# Patient Record
Sex: Female | Born: 1935 | Race: White | Hispanic: No | Marital: Married | Smoking: Former smoker
Health system: Southern US, Community
[De-identification: ages and names within clinical notes are randomized; demographics above are authoritative.]

## PROBLEM LIST (undated history)

## (undated) DIAGNOSIS — J45909 Unspecified asthma, uncomplicated: Secondary | ICD-10-CM

## (undated) DIAGNOSIS — M4306 Spondylolysis, lumbar region: Secondary | ICD-10-CM

## (undated) DIAGNOSIS — E039 Hypothyroidism, unspecified: Secondary | ICD-10-CM

## (undated) DIAGNOSIS — I5032 Chronic diastolic (congestive) heart failure: Secondary | ICD-10-CM

## (undated) DIAGNOSIS — K277 Chronic peptic ulcer, site unspecified, without hemorrhage or perforation: Secondary | ICD-10-CM

## (undated) DIAGNOSIS — K219 Gastro-esophageal reflux disease without esophagitis: Secondary | ICD-10-CM

## (undated) DIAGNOSIS — T753XXA Motion sickness, initial encounter: Secondary | ICD-10-CM

## (undated) DIAGNOSIS — H209 Unspecified iridocyclitis: Secondary | ICD-10-CM

## (undated) DIAGNOSIS — I480 Paroxysmal atrial fibrillation: Secondary | ICD-10-CM

## (undated) DIAGNOSIS — I7300 Raynaud's syndrome without gangrene: Secondary | ICD-10-CM

## (undated) DIAGNOSIS — Z972 Presence of dental prosthetic device (complete) (partial): Secondary | ICD-10-CM

## (undated) DIAGNOSIS — Z87442 Personal history of urinary calculi: Secondary | ICD-10-CM

## (undated) DIAGNOSIS — R519 Headache, unspecified: Secondary | ICD-10-CM

## (undated) DIAGNOSIS — N159 Renal tubulo-interstitial disease, unspecified: Secondary | ICD-10-CM

## (undated) DIAGNOSIS — I341 Nonrheumatic mitral (valve) prolapse: Secondary | ICD-10-CM

## (undated) DIAGNOSIS — E119 Type 2 diabetes mellitus without complications: Secondary | ICD-10-CM

## (undated) DIAGNOSIS — M1990 Unspecified osteoarthritis, unspecified site: Secondary | ICD-10-CM

## (undated) DIAGNOSIS — R062 Wheezing: Secondary | ICD-10-CM

## (undated) DIAGNOSIS — I00 Rheumatic fever without heart involvement: Secondary | ICD-10-CM

## (undated) DIAGNOSIS — K589 Irritable bowel syndrome without diarrhea: Secondary | ICD-10-CM

## (undated) DIAGNOSIS — F419 Anxiety disorder, unspecified: Secondary | ICD-10-CM

## (undated) DIAGNOSIS — J449 Chronic obstructive pulmonary disease, unspecified: Secondary | ICD-10-CM

## (undated) DIAGNOSIS — Z973 Presence of spectacles and contact lenses: Secondary | ICD-10-CM

## (undated) DIAGNOSIS — G4700 Insomnia, unspecified: Secondary | ICD-10-CM

## (undated) DIAGNOSIS — R002 Palpitations: Secondary | ICD-10-CM

## (undated) DIAGNOSIS — S72002A Fracture of unspecified part of neck of left femur, initial encounter for closed fracture: Secondary | ICD-10-CM

## (undated) DIAGNOSIS — M459 Ankylosing spondylitis of unspecified sites in spine: Secondary | ICD-10-CM

## (undated) DIAGNOSIS — M329 Systemic lupus erythematosus, unspecified: Secondary | ICD-10-CM

## (undated) DIAGNOSIS — R51 Headache: Secondary | ICD-10-CM

## (undated) DIAGNOSIS — G629 Polyneuropathy, unspecified: Secondary | ICD-10-CM

## (undated) HISTORY — DX: Nonrheumatic mitral (valve) prolapse: I34.1

## (undated) HISTORY — DX: Systemic lupus erythematosus, unspecified: M32.9

## (undated) HISTORY — DX: Palpitations: R00.2

## (undated) HISTORY — DX: Chronic diastolic (congestive) heart failure: I50.32

## (undated) HISTORY — DX: Renal tubulo-interstitial disease, unspecified: N15.9

## (undated) HISTORY — DX: Chronic obstructive pulmonary disease, unspecified: J44.9

## (undated) HISTORY — DX: Insomnia, unspecified: G47.00

## (undated) HISTORY — DX: Type 2 diabetes mellitus without complications: E11.9

## (undated) HISTORY — DX: Unspecified osteoarthritis, unspecified site: M19.90

## (undated) HISTORY — DX: Unspecified iridocyclitis: H20.9

## (undated) HISTORY — PX: COLONOSCOPY: SHX174

## (undated) HISTORY — PX: ESOPHAGOGASTRODUODENOSCOPY: SHX1529

## (undated) HISTORY — DX: Fracture of unspecified part of neck of left femur, initial encounter for closed fracture: S72.002A

## (undated) HISTORY — PX: BREAST BIOPSY: SHX20

## (undated) HISTORY — DX: Irritable bowel syndrome, unspecified: K58.9

## (undated) HISTORY — DX: Spondylolysis, lumbar region: M43.06

## (undated) HISTORY — DX: Paroxysmal atrial fibrillation: I48.0

## (undated) HISTORY — DX: Chronic peptic ulcer, site unspecified, without hemorrhage or perforation: K27.7

## (undated) HISTORY — DX: Unspecified asthma, uncomplicated: J45.909

## (undated) HISTORY — PX: BIOPSY THYROID: PRO38

## (undated) HISTORY — DX: Personal history of urinary calculi: Z87.442

## (undated) HISTORY — DX: Hypothyroidism, unspecified: E03.9

## (undated) HISTORY — DX: Raynaud's syndrome without gangrene: I73.00

## (undated) HISTORY — DX: Rheumatic fever without heart involvement: I00

## (undated) HISTORY — PX: JOINT REPLACEMENT: SHX530

## (undated) HISTORY — DX: Ankylosing spondylitis of unspecified sites in spine: M45.9

## (undated) HISTORY — DX: Anxiety disorder, unspecified: F41.9

---

## 1988-06-12 HISTORY — PX: TOTAL ABDOMINAL HYSTERECTOMY W/ BILATERAL SALPINGOOPHORECTOMY: SHX83

## 2011-06-15 DIAGNOSIS — G5620 Lesion of ulnar nerve, unspecified upper limb: Secondary | ICD-10-CM | POA: Diagnosis not present

## 2011-06-15 DIAGNOSIS — R252 Cramp and spasm: Secondary | ICD-10-CM | POA: Diagnosis not present

## 2011-06-15 DIAGNOSIS — G5600 Carpal tunnel syndrome, unspecified upper limb: Secondary | ICD-10-CM | POA: Diagnosis not present

## 2011-06-15 DIAGNOSIS — G609 Hereditary and idiopathic neuropathy, unspecified: Secondary | ICD-10-CM | POA: Diagnosis not present

## 2011-06-23 DIAGNOSIS — E039 Hypothyroidism, unspecified: Secondary | ICD-10-CM | POA: Diagnosis not present

## 2011-07-20 DIAGNOSIS — G589 Mononeuropathy, unspecified: Secondary | ICD-10-CM | POA: Diagnosis not present

## 2011-07-20 DIAGNOSIS — E039 Hypothyroidism, unspecified: Secondary | ICD-10-CM | POA: Diagnosis not present

## 2011-07-20 DIAGNOSIS — IMO0001 Reserved for inherently not codable concepts without codable children: Secondary | ICD-10-CM | POA: Diagnosis not present

## 2011-07-26 DIAGNOSIS — M549 Dorsalgia, unspecified: Secondary | ICD-10-CM | POA: Diagnosis not present

## 2011-07-26 DIAGNOSIS — E039 Hypothyroidism, unspecified: Secondary | ICD-10-CM | POA: Diagnosis not present

## 2011-07-26 DIAGNOSIS — M25569 Pain in unspecified knee: Secondary | ICD-10-CM | POA: Diagnosis not present

## 2011-09-12 DIAGNOSIS — E119 Type 2 diabetes mellitus without complications: Secondary | ICD-10-CM | POA: Diagnosis not present

## 2011-09-12 DIAGNOSIS — H2510 Age-related nuclear cataract, unspecified eye: Secondary | ICD-10-CM | POA: Diagnosis not present

## 2011-09-14 DIAGNOSIS — Z1231 Encounter for screening mammogram for malignant neoplasm of breast: Secondary | ICD-10-CM | POA: Diagnosis not present

## 2011-09-18 DIAGNOSIS — E039 Hypothyroidism, unspecified: Secondary | ICD-10-CM | POA: Diagnosis not present

## 2011-09-18 DIAGNOSIS — E041 Nontoxic single thyroid nodule: Secondary | ICD-10-CM | POA: Diagnosis not present

## 2011-09-18 DIAGNOSIS — E1149 Type 2 diabetes mellitus with other diabetic neurological complication: Secondary | ICD-10-CM | POA: Diagnosis not present

## 2011-09-20 DIAGNOSIS — L259 Unspecified contact dermatitis, unspecified cause: Secondary | ICD-10-CM | POA: Diagnosis not present

## 2011-09-20 DIAGNOSIS — E039 Hypothyroidism, unspecified: Secondary | ICD-10-CM | POA: Diagnosis not present

## 2011-09-20 DIAGNOSIS — IMO0001 Reserved for inherently not codable concepts without codable children: Secondary | ICD-10-CM | POA: Diagnosis not present

## 2011-09-20 DIAGNOSIS — N951 Menopausal and female climacteric states: Secondary | ICD-10-CM | POA: Diagnosis not present

## 2011-09-21 DIAGNOSIS — E049 Nontoxic goiter, unspecified: Secondary | ICD-10-CM | POA: Diagnosis not present

## 2011-09-27 DIAGNOSIS — G5620 Lesion of ulnar nerve, unspecified upper limb: Secondary | ICD-10-CM | POA: Diagnosis not present

## 2011-09-27 DIAGNOSIS — M542 Cervicalgia: Secondary | ICD-10-CM | POA: Diagnosis not present

## 2011-09-27 DIAGNOSIS — M545 Low back pain: Secondary | ICD-10-CM | POA: Diagnosis not present

## 2011-09-27 DIAGNOSIS — E1149 Type 2 diabetes mellitus with other diabetic neurological complication: Secondary | ICD-10-CM | POA: Diagnosis not present

## 2011-09-27 DIAGNOSIS — G5600 Carpal tunnel syndrome, unspecified upper limb: Secondary | ICD-10-CM | POA: Diagnosis not present

## 2011-09-27 DIAGNOSIS — E1142 Type 2 diabetes mellitus with diabetic polyneuropathy: Secondary | ICD-10-CM | POA: Diagnosis not present

## 2011-10-23 DIAGNOSIS — Z1283 Encounter for screening for malignant neoplasm of skin: Secondary | ICD-10-CM | POA: Diagnosis not present

## 2011-10-23 DIAGNOSIS — L918 Other hypertrophic disorders of the skin: Secondary | ICD-10-CM | POA: Diagnosis not present

## 2011-10-23 DIAGNOSIS — M329 Systemic lupus erythematosus, unspecified: Secondary | ICD-10-CM | POA: Diagnosis not present

## 2011-10-23 DIAGNOSIS — I999 Unspecified disorder of circulatory system: Secondary | ICD-10-CM | POA: Diagnosis not present

## 2011-10-23 DIAGNOSIS — L908 Other atrophic disorders of skin: Secondary | ICD-10-CM | POA: Diagnosis not present

## 2011-10-24 DIAGNOSIS — E119 Type 2 diabetes mellitus without complications: Secondary | ICD-10-CM | POA: Diagnosis not present

## 2011-10-24 DIAGNOSIS — E039 Hypothyroidism, unspecified: Secondary | ICD-10-CM | POA: Diagnosis not present

## 2011-11-06 DIAGNOSIS — J209 Acute bronchitis, unspecified: Secondary | ICD-10-CM | POA: Diagnosis not present

## 2011-11-14 DIAGNOSIS — E049 Nontoxic goiter, unspecified: Secondary | ICD-10-CM | POA: Diagnosis not present

## 2011-11-14 DIAGNOSIS — E040 Nontoxic diffuse goiter: Secondary | ICD-10-CM | POA: Diagnosis not present

## 2011-11-14 DIAGNOSIS — E039 Hypothyroidism, unspecified: Secondary | ICD-10-CM | POA: Diagnosis not present

## 2011-11-22 DIAGNOSIS — K625 Hemorrhage of anus and rectum: Secondary | ICD-10-CM | POA: Diagnosis not present

## 2011-11-22 DIAGNOSIS — M25519 Pain in unspecified shoulder: Secondary | ICD-10-CM | POA: Diagnosis not present

## 2011-11-22 DIAGNOSIS — E041 Nontoxic single thyroid nodule: Secondary | ICD-10-CM | POA: Diagnosis not present

## 2011-11-22 DIAGNOSIS — N951 Menopausal and female climacteric states: Secondary | ICD-10-CM | POA: Diagnosis not present

## 2011-11-22 DIAGNOSIS — R197 Diarrhea, unspecified: Secondary | ICD-10-CM | POA: Diagnosis not present

## 2011-11-22 DIAGNOSIS — E1149 Type 2 diabetes mellitus with other diabetic neurological complication: Secondary | ICD-10-CM | POA: Diagnosis not present

## 2011-11-22 DIAGNOSIS — E039 Hypothyroidism, unspecified: Secondary | ICD-10-CM | POA: Diagnosis not present

## 2011-11-22 DIAGNOSIS — G589 Mononeuropathy, unspecified: Secondary | ICD-10-CM | POA: Diagnosis not present

## 2011-11-22 DIAGNOSIS — R109 Unspecified abdominal pain: Secondary | ICD-10-CM | POA: Diagnosis not present

## 2011-11-23 DIAGNOSIS — R0609 Other forms of dyspnea: Secondary | ICD-10-CM | POA: Diagnosis not present

## 2011-11-23 DIAGNOSIS — J449 Chronic obstructive pulmonary disease, unspecified: Secondary | ICD-10-CM | POA: Diagnosis not present

## 2011-11-23 DIAGNOSIS — E039 Hypothyroidism, unspecified: Secondary | ICD-10-CM | POA: Diagnosis not present

## 2011-11-23 DIAGNOSIS — R197 Diarrhea, unspecified: Secondary | ICD-10-CM | POA: Diagnosis not present

## 2011-11-23 DIAGNOSIS — R0602 Shortness of breath: Secondary | ICD-10-CM | POA: Diagnosis not present

## 2011-11-23 DIAGNOSIS — N951 Menopausal and female climacteric states: Secondary | ICD-10-CM | POA: Diagnosis not present

## 2011-11-23 DIAGNOSIS — G589 Mononeuropathy, unspecified: Secondary | ICD-10-CM | POA: Diagnosis not present

## 2011-11-23 DIAGNOSIS — M549 Dorsalgia, unspecified: Secondary | ICD-10-CM | POA: Diagnosis not present

## 2011-11-23 DIAGNOSIS — M25519 Pain in unspecified shoulder: Secondary | ICD-10-CM | POA: Diagnosis not present

## 2011-11-23 DIAGNOSIS — E041 Nontoxic single thyroid nodule: Secondary | ICD-10-CM | POA: Diagnosis not present

## 2011-11-23 DIAGNOSIS — R079 Chest pain, unspecified: Secondary | ICD-10-CM | POA: Diagnosis not present

## 2011-11-23 DIAGNOSIS — E1149 Type 2 diabetes mellitus with other diabetic neurological complication: Secondary | ICD-10-CM | POA: Diagnosis not present

## 2011-11-23 DIAGNOSIS — K625 Hemorrhage of anus and rectum: Secondary | ICD-10-CM | POA: Diagnosis not present

## 2011-11-29 DIAGNOSIS — R1084 Generalized abdominal pain: Secondary | ICD-10-CM | POA: Diagnosis not present

## 2011-12-20 DIAGNOSIS — L259 Unspecified contact dermatitis, unspecified cause: Secondary | ICD-10-CM | POA: Diagnosis not present

## 2011-12-20 DIAGNOSIS — Q828 Other specified congenital malformations of skin: Secondary | ICD-10-CM | POA: Diagnosis not present

## 2011-12-25 DIAGNOSIS — M549 Dorsalgia, unspecified: Secondary | ICD-10-CM | POA: Diagnosis not present

## 2011-12-25 DIAGNOSIS — R894 Abnormal immunological findings in specimens from other organs, systems and tissues: Secondary | ICD-10-CM | POA: Diagnosis not present

## 2011-12-27 DIAGNOSIS — M47812 Spondylosis without myelopathy or radiculopathy, cervical region: Secondary | ICD-10-CM | POA: Diagnosis not present

## 2011-12-27 DIAGNOSIS — M47817 Spondylosis without myelopathy or radiculopathy, lumbosacral region: Secondary | ICD-10-CM | POA: Diagnosis not present

## 2011-12-27 DIAGNOSIS — M549 Dorsalgia, unspecified: Secondary | ICD-10-CM | POA: Diagnosis not present

## 2011-12-27 DIAGNOSIS — M5137 Other intervertebral disc degeneration, lumbosacral region: Secondary | ICD-10-CM | POA: Diagnosis not present

## 2011-12-28 DIAGNOSIS — K2960 Other gastritis without bleeding: Secondary | ICD-10-CM | POA: Diagnosis not present

## 2011-12-28 DIAGNOSIS — R1013 Epigastric pain: Secondary | ICD-10-CM | POA: Diagnosis not present

## 2011-12-28 DIAGNOSIS — D131 Benign neoplasm of stomach: Secondary | ICD-10-CM | POA: Diagnosis not present

## 2011-12-28 DIAGNOSIS — R011 Cardiac murmur, unspecified: Secondary | ICD-10-CM | POA: Diagnosis not present

## 2011-12-28 DIAGNOSIS — J449 Chronic obstructive pulmonary disease, unspecified: Secondary | ICD-10-CM | POA: Diagnosis not present

## 2011-12-28 DIAGNOSIS — K2940 Chronic atrophic gastritis without bleeding: Secondary | ICD-10-CM | POA: Diagnosis not present

## 2011-12-28 DIAGNOSIS — F17200 Nicotine dependence, unspecified, uncomplicated: Secondary | ICD-10-CM | POA: Diagnosis not present

## 2012-01-08 DIAGNOSIS — E039 Hypothyroidism, unspecified: Secondary | ICD-10-CM | POA: Diagnosis not present

## 2012-01-08 DIAGNOSIS — E042 Nontoxic multinodular goiter: Secondary | ICD-10-CM | POA: Diagnosis not present

## 2012-01-08 DIAGNOSIS — E041 Nontoxic single thyroid nodule: Secondary | ICD-10-CM | POA: Diagnosis not present

## 2012-01-08 DIAGNOSIS — E119 Type 2 diabetes mellitus without complications: Secondary | ICD-10-CM | POA: Diagnosis not present

## 2012-01-16 DIAGNOSIS — IMO0002 Reserved for concepts with insufficient information to code with codable children: Secondary | ICD-10-CM | POA: Diagnosis not present

## 2012-01-16 DIAGNOSIS — R894 Abnormal immunological findings in specimens from other organs, systems and tissues: Secondary | ICD-10-CM | POA: Diagnosis not present

## 2012-01-23 DIAGNOSIS — E039 Hypothyroidism, unspecified: Secondary | ICD-10-CM | POA: Diagnosis not present

## 2012-01-23 DIAGNOSIS — M25559 Pain in unspecified hip: Secondary | ICD-10-CM | POA: Diagnosis not present

## 2012-01-25 DIAGNOSIS — G5600 Carpal tunnel syndrome, unspecified upper limb: Secondary | ICD-10-CM | POA: Diagnosis not present

## 2012-01-25 DIAGNOSIS — E1149 Type 2 diabetes mellitus with other diabetic neurological complication: Secondary | ICD-10-CM | POA: Diagnosis not present

## 2012-01-25 DIAGNOSIS — M545 Low back pain: Secondary | ICD-10-CM | POA: Diagnosis not present

## 2012-01-25 DIAGNOSIS — G5620 Lesion of ulnar nerve, unspecified upper limb: Secondary | ICD-10-CM | POA: Diagnosis not present

## 2012-01-25 DIAGNOSIS — G609 Hereditary and idiopathic neuropathy, unspecified: Secondary | ICD-10-CM | POA: Diagnosis not present

## 2012-01-25 DIAGNOSIS — M5412 Radiculopathy, cervical region: Secondary | ICD-10-CM | POA: Diagnosis not present

## 2012-02-07 DIAGNOSIS — R109 Unspecified abdominal pain: Secondary | ICD-10-CM | POA: Diagnosis not present

## 2012-02-07 DIAGNOSIS — M169 Osteoarthritis of hip, unspecified: Secondary | ICD-10-CM | POA: Diagnosis not present

## 2012-02-07 DIAGNOSIS — R29898 Other symptoms and signs involving the musculoskeletal system: Secondary | ICD-10-CM | POA: Diagnosis not present

## 2012-02-07 DIAGNOSIS — M6688 Spontaneous rupture of other tendons, other: Secondary | ICD-10-CM | POA: Diagnosis not present

## 2012-02-13 DIAGNOSIS — E785 Hyperlipidemia, unspecified: Secondary | ICD-10-CM | POA: Diagnosis not present

## 2012-02-13 DIAGNOSIS — E119 Type 2 diabetes mellitus without complications: Secondary | ICD-10-CM | POA: Diagnosis not present

## 2012-02-19 DIAGNOSIS — M549 Dorsalgia, unspecified: Secondary | ICD-10-CM | POA: Diagnosis not present

## 2012-02-19 DIAGNOSIS — E1149 Type 2 diabetes mellitus with other diabetic neurological complication: Secondary | ICD-10-CM | POA: Diagnosis not present

## 2012-02-19 DIAGNOSIS — M542 Cervicalgia: Secondary | ICD-10-CM | POA: Diagnosis not present

## 2012-02-19 DIAGNOSIS — M25559 Pain in unspecified hip: Secondary | ICD-10-CM | POA: Diagnosis not present

## 2012-02-21 DIAGNOSIS — E119 Type 2 diabetes mellitus without complications: Secondary | ICD-10-CM | POA: Diagnosis not present

## 2012-02-21 DIAGNOSIS — E049 Nontoxic goiter, unspecified: Secondary | ICD-10-CM | POA: Diagnosis not present

## 2012-02-21 DIAGNOSIS — E039 Hypothyroidism, unspecified: Secondary | ICD-10-CM | POA: Diagnosis not present

## 2012-04-17 DIAGNOSIS — R197 Diarrhea, unspecified: Secondary | ICD-10-CM | POA: Diagnosis not present

## 2012-04-18 DIAGNOSIS — Z23 Encounter for immunization: Secondary | ICD-10-CM | POA: Diagnosis not present

## 2012-04-25 DIAGNOSIS — M169 Osteoarthritis of hip, unspecified: Secondary | ICD-10-CM | POA: Diagnosis not present

## 2012-04-25 DIAGNOSIS — M25559 Pain in unspecified hip: Secondary | ICD-10-CM | POA: Diagnosis not present

## 2012-04-25 DIAGNOSIS — M76899 Other specified enthesopathies of unspecified lower limb, excluding foot: Secondary | ICD-10-CM | POA: Diagnosis not present

## 2012-05-08 DIAGNOSIS — H04129 Dry eye syndrome of unspecified lacrimal gland: Secondary | ICD-10-CM | POA: Diagnosis not present

## 2012-05-08 DIAGNOSIS — H2510 Age-related nuclear cataract, unspecified eye: Secondary | ICD-10-CM | POA: Diagnosis not present

## 2012-05-14 DIAGNOSIS — M169 Osteoarthritis of hip, unspecified: Secondary | ICD-10-CM | POA: Diagnosis not present

## 2012-05-14 DIAGNOSIS — M25559 Pain in unspecified hip: Secondary | ICD-10-CM | POA: Diagnosis not present

## 2012-05-21 DIAGNOSIS — J45909 Unspecified asthma, uncomplicated: Secondary | ICD-10-CM | POA: Diagnosis not present

## 2012-05-21 DIAGNOSIS — R198 Other specified symptoms and signs involving the digestive system and abdomen: Secondary | ICD-10-CM | POA: Diagnosis not present

## 2012-05-21 DIAGNOSIS — K6389 Other specified diseases of intestine: Secondary | ICD-10-CM | POA: Diagnosis not present

## 2012-05-21 DIAGNOSIS — K649 Unspecified hemorrhoids: Secondary | ICD-10-CM | POA: Diagnosis not present

## 2012-05-21 DIAGNOSIS — E119 Type 2 diabetes mellitus without complications: Secondary | ICD-10-CM | POA: Diagnosis not present

## 2012-05-21 DIAGNOSIS — K589 Irritable bowel syndrome without diarrhea: Secondary | ICD-10-CM | POA: Diagnosis not present

## 2012-05-21 DIAGNOSIS — R197 Diarrhea, unspecified: Secondary | ICD-10-CM | POA: Diagnosis not present

## 2012-05-21 DIAGNOSIS — I059 Rheumatic mitral valve disease, unspecified: Secondary | ICD-10-CM | POA: Diagnosis not present

## 2012-05-21 DIAGNOSIS — K660 Peritoneal adhesions (postprocedural) (postinfection): Secondary | ICD-10-CM | POA: Diagnosis not present

## 2012-05-24 DIAGNOSIS — K219 Gastro-esophageal reflux disease without esophagitis: Secondary | ICD-10-CM | POA: Diagnosis not present

## 2012-05-24 DIAGNOSIS — R197 Diarrhea, unspecified: Secondary | ICD-10-CM | POA: Diagnosis not present

## 2012-05-27 DIAGNOSIS — E042 Nontoxic multinodular goiter: Secondary | ICD-10-CM | POA: Diagnosis not present

## 2012-06-11 DIAGNOSIS — E039 Hypothyroidism, unspecified: Secondary | ICD-10-CM | POA: Diagnosis not present

## 2012-06-11 DIAGNOSIS — E042 Nontoxic multinodular goiter: Secondary | ICD-10-CM | POA: Diagnosis not present

## 2012-06-18 DIAGNOSIS — E119 Type 2 diabetes mellitus without complications: Secondary | ICD-10-CM | POA: Diagnosis not present

## 2012-06-18 DIAGNOSIS — E049 Nontoxic goiter, unspecified: Secondary | ICD-10-CM | POA: Diagnosis not present

## 2012-06-25 DIAGNOSIS — M76899 Other specified enthesopathies of unspecified lower limb, excluding foot: Secondary | ICD-10-CM | POA: Diagnosis not present

## 2012-06-25 DIAGNOSIS — M169 Osteoarthritis of hip, unspecified: Secondary | ICD-10-CM | POA: Diagnosis not present

## 2012-06-25 DIAGNOSIS — M25559 Pain in unspecified hip: Secondary | ICD-10-CM | POA: Diagnosis not present

## 2012-06-26 DIAGNOSIS — R197 Diarrhea, unspecified: Secondary | ICD-10-CM | POA: Diagnosis not present

## 2012-07-17 DIAGNOSIS — IMO0002 Reserved for concepts with insufficient information to code with codable children: Secondary | ICD-10-CM | POA: Diagnosis not present

## 2012-07-17 DIAGNOSIS — R894 Abnormal immunological findings in specimens from other organs, systems and tissues: Secondary | ICD-10-CM | POA: Diagnosis not present

## 2012-07-30 DIAGNOSIS — G609 Hereditary and idiopathic neuropathy, unspecified: Secondary | ICD-10-CM | POA: Diagnosis not present

## 2012-07-30 DIAGNOSIS — G5600 Carpal tunnel syndrome, unspecified upper limb: Secondary | ICD-10-CM | POA: Diagnosis not present

## 2012-07-30 DIAGNOSIS — E1142 Type 2 diabetes mellitus with diabetic polyneuropathy: Secondary | ICD-10-CM | POA: Diagnosis not present

## 2012-07-30 DIAGNOSIS — G5620 Lesion of ulnar nerve, unspecified upper limb: Secondary | ICD-10-CM | POA: Diagnosis not present

## 2012-09-10 DIAGNOSIS — L03119 Cellulitis of unspecified part of limb: Secondary | ICD-10-CM | POA: Diagnosis not present

## 2012-09-10 DIAGNOSIS — L02419 Cutaneous abscess of limb, unspecified: Secondary | ICD-10-CM | POA: Diagnosis not present

## 2012-09-13 DIAGNOSIS — L97209 Non-pressure chronic ulcer of unspecified calf with unspecified severity: Secondary | ICD-10-CM | POA: Diagnosis not present

## 2012-10-07 DIAGNOSIS — Z1231 Encounter for screening mammogram for malignant neoplasm of breast: Secondary | ICD-10-CM | POA: Diagnosis not present

## 2012-10-28 DIAGNOSIS — H2589 Other age-related cataract: Secondary | ICD-10-CM | POA: Diagnosis not present

## 2012-10-28 DIAGNOSIS — E119 Type 2 diabetes mellitus without complications: Secondary | ICD-10-CM | POA: Diagnosis not present

## 2012-11-12 DIAGNOSIS — D492 Neoplasm of unspecified behavior of bone, soft tissue, and skin: Secondary | ICD-10-CM | POA: Diagnosis not present

## 2012-11-19 DIAGNOSIS — S81809A Unspecified open wound, unspecified lower leg, initial encounter: Secondary | ICD-10-CM | POA: Diagnosis not present

## 2012-12-03 DIAGNOSIS — S81009A Unspecified open wound, unspecified knee, initial encounter: Secondary | ICD-10-CM | POA: Diagnosis not present

## 2012-12-03 DIAGNOSIS — Z5320 Procedure and treatment not carried out because of patient's decision for unspecified reasons: Secondary | ICD-10-CM | POA: Diagnosis not present

## 2012-12-03 DIAGNOSIS — S91009A Unspecified open wound, unspecified ankle, initial encounter: Secondary | ICD-10-CM | POA: Diagnosis not present

## 2012-12-15 DIAGNOSIS — J069 Acute upper respiratory infection, unspecified: Secondary | ICD-10-CM | POA: Diagnosis not present

## 2012-12-15 DIAGNOSIS — R0789 Other chest pain: Secondary | ICD-10-CM | POA: Diagnosis not present

## 2012-12-15 DIAGNOSIS — R52 Pain, unspecified: Secondary | ICD-10-CM | POA: Diagnosis not present

## 2012-12-15 DIAGNOSIS — R0989 Other specified symptoms and signs involving the circulatory and respiratory systems: Secondary | ICD-10-CM | POA: Diagnosis not present

## 2012-12-15 DIAGNOSIS — I517 Cardiomegaly: Secondary | ICD-10-CM | POA: Diagnosis not present

## 2012-12-15 DIAGNOSIS — J9600 Acute respiratory failure, unspecified whether with hypoxia or hypercapnia: Secondary | ICD-10-CM | POA: Diagnosis not present

## 2012-12-15 DIAGNOSIS — R42 Dizziness and giddiness: Secondary | ICD-10-CM | POA: Diagnosis not present

## 2012-12-15 DIAGNOSIS — F17200 Nicotine dependence, unspecified, uncomplicated: Secondary | ICD-10-CM | POA: Diagnosis not present

## 2012-12-15 DIAGNOSIS — E872 Acidosis, unspecified: Secondary | ICD-10-CM | POA: Diagnosis not present

## 2012-12-15 DIAGNOSIS — J9620 Acute and chronic respiratory failure, unspecified whether with hypoxia or hypercapnia: Secondary | ICD-10-CM | POA: Diagnosis not present

## 2012-12-15 DIAGNOSIS — J3489 Other specified disorders of nose and nasal sinuses: Secondary | ICD-10-CM | POA: Diagnosis not present

## 2012-12-15 DIAGNOSIS — E039 Hypothyroidism, unspecified: Secondary | ICD-10-CM | POA: Diagnosis not present

## 2012-12-15 DIAGNOSIS — J441 Chronic obstructive pulmonary disease with (acute) exacerbation: Secondary | ICD-10-CM | POA: Diagnosis present

## 2012-12-15 DIAGNOSIS — R0902 Hypoxemia: Secondary | ICD-10-CM | POA: Diagnosis not present

## 2012-12-15 DIAGNOSIS — R05 Cough: Secondary | ICD-10-CM | POA: Diagnosis not present

## 2012-12-15 DIAGNOSIS — I270 Primary pulmonary hypertension: Secondary | ICD-10-CM | POA: Diagnosis not present

## 2012-12-15 DIAGNOSIS — R079 Chest pain, unspecified: Secondary | ICD-10-CM | POA: Diagnosis present

## 2012-12-15 DIAGNOSIS — R0602 Shortness of breath: Secondary | ICD-10-CM | POA: Diagnosis not present

## 2012-12-15 DIAGNOSIS — M6281 Muscle weakness (generalized): Secondary | ICD-10-CM | POA: Diagnosis not present

## 2012-12-15 DIAGNOSIS — R0609 Other forms of dyspnea: Secondary | ICD-10-CM | POA: Diagnosis not present

## 2012-12-15 DIAGNOSIS — J449 Chronic obstructive pulmonary disease, unspecified: Secondary | ICD-10-CM | POA: Diagnosis not present

## 2012-12-15 DIAGNOSIS — R000 Tachycardia, unspecified: Secondary | ICD-10-CM | POA: Diagnosis not present

## 2012-12-15 DIAGNOSIS — R5381 Other malaise: Secondary | ICD-10-CM | POA: Diagnosis not present

## 2012-12-15 DIAGNOSIS — I369 Nonrheumatic tricuspid valve disorder, unspecified: Secondary | ICD-10-CM | POA: Diagnosis not present

## 2012-12-15 DIAGNOSIS — R509 Fever, unspecified: Secondary | ICD-10-CM | POA: Diagnosis not present

## 2012-12-15 DIAGNOSIS — K219 Gastro-esophageal reflux disease without esophagitis: Secondary | ICD-10-CM | POA: Diagnosis not present

## 2012-12-15 DIAGNOSIS — IMO0001 Reserved for inherently not codable concepts without codable children: Secondary | ICD-10-CM | POA: Diagnosis present

## 2012-12-15 DIAGNOSIS — I279 Pulmonary heart disease, unspecified: Secondary | ICD-10-CM | POA: Diagnosis present

## 2012-12-15 DIAGNOSIS — J329 Chronic sinusitis, unspecified: Secondary | ICD-10-CM | POA: Diagnosis present

## 2012-12-15 DIAGNOSIS — I658 Occlusion and stenosis of other precerebral arteries: Secondary | ICD-10-CM | POA: Diagnosis not present

## 2012-12-15 DIAGNOSIS — E119 Type 2 diabetes mellitus without complications: Secondary | ICD-10-CM | POA: Diagnosis not present

## 2012-12-18 DIAGNOSIS — R079 Chest pain, unspecified: Secondary | ICD-10-CM | POA: Diagnosis not present

## 2012-12-18 DIAGNOSIS — R0902 Hypoxemia: Secondary | ICD-10-CM | POA: Diagnosis not present

## 2012-12-18 DIAGNOSIS — R05 Cough: Secondary | ICD-10-CM | POA: Diagnosis not present

## 2012-12-31 DIAGNOSIS — E119 Type 2 diabetes mellitus without complications: Secondary | ICD-10-CM | POA: Diagnosis not present

## 2012-12-31 DIAGNOSIS — J441 Chronic obstructive pulmonary disease with (acute) exacerbation: Secondary | ICD-10-CM | POA: Diagnosis not present

## 2012-12-31 DIAGNOSIS — M459 Ankylosing spondylitis of unspecified sites in spine: Secondary | ICD-10-CM | POA: Diagnosis not present

## 2013-01-01 DIAGNOSIS — E119 Type 2 diabetes mellitus without complications: Secondary | ICD-10-CM | POA: Diagnosis not present

## 2013-01-01 DIAGNOSIS — J441 Chronic obstructive pulmonary disease with (acute) exacerbation: Secondary | ICD-10-CM | POA: Diagnosis not present

## 2013-01-01 DIAGNOSIS — M459 Ankylosing spondylitis of unspecified sites in spine: Secondary | ICD-10-CM | POA: Diagnosis not present

## 2013-01-03 DIAGNOSIS — E039 Hypothyroidism, unspecified: Secondary | ICD-10-CM | POA: Diagnosis not present

## 2013-01-03 DIAGNOSIS — J449 Chronic obstructive pulmonary disease, unspecified: Secondary | ICD-10-CM | POA: Diagnosis not present

## 2013-01-06 DIAGNOSIS — E119 Type 2 diabetes mellitus without complications: Secondary | ICD-10-CM | POA: Diagnosis not present

## 2013-01-06 DIAGNOSIS — J441 Chronic obstructive pulmonary disease with (acute) exacerbation: Secondary | ICD-10-CM | POA: Diagnosis not present

## 2013-01-06 DIAGNOSIS — M459 Ankylosing spondylitis of unspecified sites in spine: Secondary | ICD-10-CM | POA: Diagnosis not present

## 2013-01-07 DIAGNOSIS — J441 Chronic obstructive pulmonary disease with (acute) exacerbation: Secondary | ICD-10-CM | POA: Diagnosis not present

## 2013-01-07 DIAGNOSIS — E119 Type 2 diabetes mellitus without complications: Secondary | ICD-10-CM | POA: Diagnosis not present

## 2013-01-07 DIAGNOSIS — M459 Ankylosing spondylitis of unspecified sites in spine: Secondary | ICD-10-CM | POA: Diagnosis not present

## 2013-01-09 DIAGNOSIS — J441 Chronic obstructive pulmonary disease with (acute) exacerbation: Secondary | ICD-10-CM | POA: Diagnosis not present

## 2013-01-09 DIAGNOSIS — E119 Type 2 diabetes mellitus without complications: Secondary | ICD-10-CM | POA: Diagnosis not present

## 2013-01-09 DIAGNOSIS — M459 Ankylosing spondylitis of unspecified sites in spine: Secondary | ICD-10-CM | POA: Diagnosis not present

## 2013-01-13 DIAGNOSIS — E119 Type 2 diabetes mellitus without complications: Secondary | ICD-10-CM | POA: Diagnosis not present

## 2013-01-13 DIAGNOSIS — J441 Chronic obstructive pulmonary disease with (acute) exacerbation: Secondary | ICD-10-CM | POA: Diagnosis not present

## 2013-01-13 DIAGNOSIS — M459 Ankylosing spondylitis of unspecified sites in spine: Secondary | ICD-10-CM | POA: Diagnosis not present

## 2013-01-14 DIAGNOSIS — M459 Ankylosing spondylitis of unspecified sites in spine: Secondary | ICD-10-CM | POA: Diagnosis not present

## 2013-01-14 DIAGNOSIS — E119 Type 2 diabetes mellitus without complications: Secondary | ICD-10-CM | POA: Diagnosis not present

## 2013-01-14 DIAGNOSIS — J441 Chronic obstructive pulmonary disease with (acute) exacerbation: Secondary | ICD-10-CM | POA: Diagnosis not present

## 2013-01-15 DIAGNOSIS — J441 Chronic obstructive pulmonary disease with (acute) exacerbation: Secondary | ICD-10-CM | POA: Diagnosis not present

## 2013-01-15 DIAGNOSIS — M459 Ankylosing spondylitis of unspecified sites in spine: Secondary | ICD-10-CM | POA: Diagnosis not present

## 2013-01-15 DIAGNOSIS — E119 Type 2 diabetes mellitus without complications: Secondary | ICD-10-CM | POA: Diagnosis not present

## 2013-01-19 DIAGNOSIS — M459 Ankylosing spondylitis of unspecified sites in spine: Secondary | ICD-10-CM | POA: Diagnosis not present

## 2013-01-19 DIAGNOSIS — J441 Chronic obstructive pulmonary disease with (acute) exacerbation: Secondary | ICD-10-CM | POA: Diagnosis not present

## 2013-01-19 DIAGNOSIS — E119 Type 2 diabetes mellitus without complications: Secondary | ICD-10-CM | POA: Diagnosis not present

## 2013-01-21 DIAGNOSIS — J441 Chronic obstructive pulmonary disease with (acute) exacerbation: Secondary | ICD-10-CM | POA: Diagnosis not present

## 2013-01-21 DIAGNOSIS — M459 Ankylosing spondylitis of unspecified sites in spine: Secondary | ICD-10-CM | POA: Diagnosis not present

## 2013-01-21 DIAGNOSIS — E119 Type 2 diabetes mellitus without complications: Secondary | ICD-10-CM | POA: Diagnosis not present

## 2013-01-23 DIAGNOSIS — J449 Chronic obstructive pulmonary disease, unspecified: Secondary | ICD-10-CM | POA: Diagnosis not present

## 2013-01-30 DIAGNOSIS — J441 Chronic obstructive pulmonary disease with (acute) exacerbation: Secondary | ICD-10-CM | POA: Diagnosis not present

## 2013-01-30 DIAGNOSIS — M459 Ankylosing spondylitis of unspecified sites in spine: Secondary | ICD-10-CM | POA: Diagnosis not present

## 2013-01-30 DIAGNOSIS — E119 Type 2 diabetes mellitus without complications: Secondary | ICD-10-CM | POA: Diagnosis not present

## 2013-02-03 DIAGNOSIS — I498 Other specified cardiac arrhythmias: Secondary | ICD-10-CM | POA: Diagnosis not present

## 2013-02-03 DIAGNOSIS — F17200 Nicotine dependence, unspecified, uncomplicated: Secondary | ICD-10-CM | POA: Diagnosis not present

## 2013-02-03 DIAGNOSIS — R002 Palpitations: Secondary | ICD-10-CM | POA: Diagnosis not present

## 2013-02-03 DIAGNOSIS — R000 Tachycardia, unspecified: Secondary | ICD-10-CM | POA: Diagnosis not present

## 2013-02-03 DIAGNOSIS — E119 Type 2 diabetes mellitus without complications: Secondary | ICD-10-CM | POA: Diagnosis not present

## 2013-02-03 DIAGNOSIS — J449 Chronic obstructive pulmonary disease, unspecified: Secondary | ICD-10-CM | POA: Diagnosis not present

## 2013-02-03 DIAGNOSIS — R0602 Shortness of breath: Secondary | ICD-10-CM | POA: Diagnosis not present

## 2013-02-04 DIAGNOSIS — E785 Hyperlipidemia, unspecified: Secondary | ICD-10-CM | POA: Diagnosis not present

## 2013-02-04 DIAGNOSIS — E039 Hypothyroidism, unspecified: Secondary | ICD-10-CM | POA: Diagnosis not present

## 2013-02-04 DIAGNOSIS — J441 Chronic obstructive pulmonary disease with (acute) exacerbation: Secondary | ICD-10-CM | POA: Diagnosis not present

## 2013-02-04 DIAGNOSIS — M459 Ankylosing spondylitis of unspecified sites in spine: Secondary | ICD-10-CM | POA: Diagnosis not present

## 2013-02-04 DIAGNOSIS — E119 Type 2 diabetes mellitus without complications: Secondary | ICD-10-CM | POA: Diagnosis not present

## 2013-02-06 DIAGNOSIS — M459 Ankylosing spondylitis of unspecified sites in spine: Secondary | ICD-10-CM | POA: Diagnosis not present

## 2013-02-06 DIAGNOSIS — I2510 Atherosclerotic heart disease of native coronary artery without angina pectoris: Secondary | ICD-10-CM | POA: Diagnosis not present

## 2013-02-06 DIAGNOSIS — J441 Chronic obstructive pulmonary disease with (acute) exacerbation: Secondary | ICD-10-CM | POA: Diagnosis not present

## 2013-02-06 DIAGNOSIS — M329 Systemic lupus erythematosus, unspecified: Secondary | ICD-10-CM | POA: Diagnosis not present

## 2013-02-06 DIAGNOSIS — E119 Type 2 diabetes mellitus without complications: Secondary | ICD-10-CM | POA: Diagnosis not present

## 2013-02-10 DIAGNOSIS — M459 Ankylosing spondylitis of unspecified sites in spine: Secondary | ICD-10-CM | POA: Diagnosis not present

## 2013-02-10 DIAGNOSIS — I2510 Atherosclerotic heart disease of native coronary artery without angina pectoris: Secondary | ICD-10-CM | POA: Diagnosis not present

## 2013-02-10 DIAGNOSIS — M329 Systemic lupus erythematosus, unspecified: Secondary | ICD-10-CM | POA: Diagnosis not present

## 2013-02-10 DIAGNOSIS — E119 Type 2 diabetes mellitus without complications: Secondary | ICD-10-CM | POA: Diagnosis not present

## 2013-02-10 DIAGNOSIS — J441 Chronic obstructive pulmonary disease with (acute) exacerbation: Secondary | ICD-10-CM | POA: Diagnosis not present

## 2013-02-11 DIAGNOSIS — M459 Ankylosing spondylitis of unspecified sites in spine: Secondary | ICD-10-CM | POA: Diagnosis not present

## 2013-02-11 DIAGNOSIS — E119 Type 2 diabetes mellitus without complications: Secondary | ICD-10-CM | POA: Diagnosis not present

## 2013-02-11 DIAGNOSIS — M329 Systemic lupus erythematosus, unspecified: Secondary | ICD-10-CM | POA: Diagnosis not present

## 2013-02-11 DIAGNOSIS — I2510 Atherosclerotic heart disease of native coronary artery without angina pectoris: Secondary | ICD-10-CM | POA: Diagnosis not present

## 2013-02-11 DIAGNOSIS — J441 Chronic obstructive pulmonary disease with (acute) exacerbation: Secondary | ICD-10-CM | POA: Diagnosis not present

## 2013-02-12 DIAGNOSIS — IMO0002 Reserved for concepts with insufficient information to code with codable children: Secondary | ICD-10-CM | POA: Diagnosis not present

## 2013-02-12 DIAGNOSIS — E039 Hypothyroidism, unspecified: Secondary | ICD-10-CM | POA: Diagnosis not present

## 2013-02-12 DIAGNOSIS — R894 Abnormal immunological findings in specimens from other organs, systems and tissues: Secondary | ICD-10-CM | POA: Diagnosis not present

## 2013-02-12 DIAGNOSIS — E785 Hyperlipidemia, unspecified: Secondary | ICD-10-CM | POA: Diagnosis not present

## 2013-02-13 DIAGNOSIS — J441 Chronic obstructive pulmonary disease with (acute) exacerbation: Secondary | ICD-10-CM | POA: Diagnosis not present

## 2013-02-13 DIAGNOSIS — M459 Ankylosing spondylitis of unspecified sites in spine: Secondary | ICD-10-CM | POA: Diagnosis not present

## 2013-02-13 DIAGNOSIS — I2510 Atherosclerotic heart disease of native coronary artery without angina pectoris: Secondary | ICD-10-CM | POA: Diagnosis not present

## 2013-02-13 DIAGNOSIS — E119 Type 2 diabetes mellitus without complications: Secondary | ICD-10-CM | POA: Diagnosis not present

## 2013-02-13 DIAGNOSIS — M329 Systemic lupus erythematosus, unspecified: Secondary | ICD-10-CM | POA: Diagnosis not present

## 2013-02-14 DIAGNOSIS — I2510 Atherosclerotic heart disease of native coronary artery without angina pectoris: Secondary | ICD-10-CM | POA: Diagnosis not present

## 2013-02-14 DIAGNOSIS — M329 Systemic lupus erythematosus, unspecified: Secondary | ICD-10-CM | POA: Diagnosis not present

## 2013-02-14 DIAGNOSIS — E119 Type 2 diabetes mellitus without complications: Secondary | ICD-10-CM | POA: Diagnosis not present

## 2013-02-14 DIAGNOSIS — J449 Chronic obstructive pulmonary disease, unspecified: Secondary | ICD-10-CM | POA: Diagnosis not present

## 2013-02-14 DIAGNOSIS — I7300 Raynaud's syndrome without gangrene: Secondary | ICD-10-CM | POA: Diagnosis not present

## 2013-02-14 DIAGNOSIS — I479 Paroxysmal tachycardia, unspecified: Secondary | ICD-10-CM | POA: Diagnosis not present

## 2013-02-14 DIAGNOSIS — J441 Chronic obstructive pulmonary disease with (acute) exacerbation: Secondary | ICD-10-CM | POA: Diagnosis not present

## 2013-02-14 DIAGNOSIS — M459 Ankylosing spondylitis of unspecified sites in spine: Secondary | ICD-10-CM | POA: Diagnosis not present

## 2013-02-17 DIAGNOSIS — I2510 Atherosclerotic heart disease of native coronary artery without angina pectoris: Secondary | ICD-10-CM | POA: Diagnosis not present

## 2013-02-17 DIAGNOSIS — M459 Ankylosing spondylitis of unspecified sites in spine: Secondary | ICD-10-CM | POA: Diagnosis not present

## 2013-02-17 DIAGNOSIS — M329 Systemic lupus erythematosus, unspecified: Secondary | ICD-10-CM | POA: Diagnosis not present

## 2013-02-17 DIAGNOSIS — E119 Type 2 diabetes mellitus without complications: Secondary | ICD-10-CM | POA: Diagnosis not present

## 2013-02-17 DIAGNOSIS — J441 Chronic obstructive pulmonary disease with (acute) exacerbation: Secondary | ICD-10-CM | POA: Diagnosis not present

## 2013-02-19 DIAGNOSIS — M329 Systemic lupus erythematosus, unspecified: Secondary | ICD-10-CM | POA: Diagnosis not present

## 2013-02-19 DIAGNOSIS — M459 Ankylosing spondylitis of unspecified sites in spine: Secondary | ICD-10-CM | POA: Diagnosis not present

## 2013-02-19 DIAGNOSIS — I2510 Atherosclerotic heart disease of native coronary artery without angina pectoris: Secondary | ICD-10-CM | POA: Diagnosis not present

## 2013-02-19 DIAGNOSIS — J441 Chronic obstructive pulmonary disease with (acute) exacerbation: Secondary | ICD-10-CM | POA: Diagnosis not present

## 2013-02-19 DIAGNOSIS — E119 Type 2 diabetes mellitus without complications: Secondary | ICD-10-CM | POA: Diagnosis not present

## 2013-02-20 DIAGNOSIS — I2510 Atherosclerotic heart disease of native coronary artery without angina pectoris: Secondary | ICD-10-CM | POA: Diagnosis not present

## 2013-02-20 DIAGNOSIS — M459 Ankylosing spondylitis of unspecified sites in spine: Secondary | ICD-10-CM | POA: Diagnosis not present

## 2013-02-20 DIAGNOSIS — M329 Systemic lupus erythematosus, unspecified: Secondary | ICD-10-CM | POA: Diagnosis not present

## 2013-02-20 DIAGNOSIS — J441 Chronic obstructive pulmonary disease with (acute) exacerbation: Secondary | ICD-10-CM | POA: Diagnosis not present

## 2013-02-20 DIAGNOSIS — E119 Type 2 diabetes mellitus without complications: Secondary | ICD-10-CM | POA: Diagnosis not present

## 2013-02-25 DIAGNOSIS — I2510 Atherosclerotic heart disease of native coronary artery without angina pectoris: Secondary | ICD-10-CM | POA: Diagnosis not present

## 2013-02-25 DIAGNOSIS — J441 Chronic obstructive pulmonary disease with (acute) exacerbation: Secondary | ICD-10-CM | POA: Diagnosis not present

## 2013-02-25 DIAGNOSIS — E119 Type 2 diabetes mellitus without complications: Secondary | ICD-10-CM | POA: Diagnosis not present

## 2013-02-25 DIAGNOSIS — M459 Ankylosing spondylitis of unspecified sites in spine: Secondary | ICD-10-CM | POA: Diagnosis not present

## 2013-02-25 DIAGNOSIS — M329 Systemic lupus erythematosus, unspecified: Secondary | ICD-10-CM | POA: Diagnosis not present

## 2013-02-26 DIAGNOSIS — M459 Ankylosing spondylitis of unspecified sites in spine: Secondary | ICD-10-CM | POA: Diagnosis not present

## 2013-02-26 DIAGNOSIS — M329 Systemic lupus erythematosus, unspecified: Secondary | ICD-10-CM | POA: Diagnosis not present

## 2013-02-26 DIAGNOSIS — I2510 Atherosclerotic heart disease of native coronary artery without angina pectoris: Secondary | ICD-10-CM | POA: Diagnosis not present

## 2013-02-26 DIAGNOSIS — E119 Type 2 diabetes mellitus without complications: Secondary | ICD-10-CM | POA: Diagnosis not present

## 2013-02-26 DIAGNOSIS — J441 Chronic obstructive pulmonary disease with (acute) exacerbation: Secondary | ICD-10-CM | POA: Diagnosis not present

## 2013-02-27 DIAGNOSIS — J441 Chronic obstructive pulmonary disease with (acute) exacerbation: Secondary | ICD-10-CM | POA: Diagnosis not present

## 2013-02-27 DIAGNOSIS — E119 Type 2 diabetes mellitus without complications: Secondary | ICD-10-CM | POA: Diagnosis not present

## 2013-02-27 DIAGNOSIS — K589 Irritable bowel syndrome without diarrhea: Secondary | ICD-10-CM | POA: Diagnosis not present

## 2013-02-27 DIAGNOSIS — I051 Rheumatic mitral insufficiency: Secondary | ICD-10-CM | POA: Diagnosis not present

## 2013-02-27 DIAGNOSIS — M329 Systemic lupus erythematosus, unspecified: Secondary | ICD-10-CM | POA: Diagnosis not present

## 2013-02-27 DIAGNOSIS — I2510 Atherosclerotic heart disease of native coronary artery without angina pectoris: Secondary | ICD-10-CM | POA: Diagnosis not present

## 2013-02-27 DIAGNOSIS — J449 Chronic obstructive pulmonary disease, unspecified: Secondary | ICD-10-CM | POA: Diagnosis not present

## 2013-02-27 DIAGNOSIS — M459 Ankylosing spondylitis of unspecified sites in spine: Secondary | ICD-10-CM | POA: Diagnosis not present

## 2013-02-28 DIAGNOSIS — J441 Chronic obstructive pulmonary disease with (acute) exacerbation: Secondary | ICD-10-CM | POA: Diagnosis not present

## 2013-02-28 DIAGNOSIS — M459 Ankylosing spondylitis of unspecified sites in spine: Secondary | ICD-10-CM | POA: Diagnosis not present

## 2013-02-28 DIAGNOSIS — I2510 Atherosclerotic heart disease of native coronary artery without angina pectoris: Secondary | ICD-10-CM | POA: Diagnosis not present

## 2013-02-28 DIAGNOSIS — E119 Type 2 diabetes mellitus without complications: Secondary | ICD-10-CM | POA: Diagnosis not present

## 2013-02-28 DIAGNOSIS — M329 Systemic lupus erythematosus, unspecified: Secondary | ICD-10-CM | POA: Diagnosis not present

## 2013-03-03 DIAGNOSIS — M459 Ankylosing spondylitis of unspecified sites in spine: Secondary | ICD-10-CM | POA: Diagnosis not present

## 2013-03-03 DIAGNOSIS — E119 Type 2 diabetes mellitus without complications: Secondary | ICD-10-CM | POA: Diagnosis not present

## 2013-03-03 DIAGNOSIS — M329 Systemic lupus erythematosus, unspecified: Secondary | ICD-10-CM | POA: Diagnosis not present

## 2013-03-03 DIAGNOSIS — I2510 Atherosclerotic heart disease of native coronary artery without angina pectoris: Secondary | ICD-10-CM | POA: Diagnosis not present

## 2013-03-03 DIAGNOSIS — J441 Chronic obstructive pulmonary disease with (acute) exacerbation: Secondary | ICD-10-CM | POA: Diagnosis not present

## 2013-03-04 DIAGNOSIS — I2510 Atherosclerotic heart disease of native coronary artery without angina pectoris: Secondary | ICD-10-CM | POA: Diagnosis not present

## 2013-03-04 DIAGNOSIS — J441 Chronic obstructive pulmonary disease with (acute) exacerbation: Secondary | ICD-10-CM | POA: Diagnosis not present

## 2013-03-04 DIAGNOSIS — M459 Ankylosing spondylitis of unspecified sites in spine: Secondary | ICD-10-CM | POA: Diagnosis not present

## 2013-03-04 DIAGNOSIS — M329 Systemic lupus erythematosus, unspecified: Secondary | ICD-10-CM | POA: Diagnosis not present

## 2013-03-04 DIAGNOSIS — E119 Type 2 diabetes mellitus without complications: Secondary | ICD-10-CM | POA: Diagnosis not present

## 2013-03-05 DIAGNOSIS — M459 Ankylosing spondylitis of unspecified sites in spine: Secondary | ICD-10-CM | POA: Diagnosis not present

## 2013-03-05 DIAGNOSIS — I2510 Atherosclerotic heart disease of native coronary artery without angina pectoris: Secondary | ICD-10-CM | POA: Diagnosis not present

## 2013-03-05 DIAGNOSIS — J441 Chronic obstructive pulmonary disease with (acute) exacerbation: Secondary | ICD-10-CM | POA: Diagnosis not present

## 2013-03-05 DIAGNOSIS — M329 Systemic lupus erythematosus, unspecified: Secondary | ICD-10-CM | POA: Diagnosis not present

## 2013-03-05 DIAGNOSIS — E119 Type 2 diabetes mellitus without complications: Secondary | ICD-10-CM | POA: Diagnosis not present

## 2013-03-06 DIAGNOSIS — M459 Ankylosing spondylitis of unspecified sites in spine: Secondary | ICD-10-CM | POA: Diagnosis not present

## 2013-03-06 DIAGNOSIS — M329 Systemic lupus erythematosus, unspecified: Secondary | ICD-10-CM | POA: Diagnosis not present

## 2013-03-06 DIAGNOSIS — J441 Chronic obstructive pulmonary disease with (acute) exacerbation: Secondary | ICD-10-CM | POA: Diagnosis not present

## 2013-03-06 DIAGNOSIS — K900 Celiac disease: Secondary | ICD-10-CM | POA: Diagnosis not present

## 2013-03-06 DIAGNOSIS — E119 Type 2 diabetes mellitus without complications: Secondary | ICD-10-CM | POA: Diagnosis not present

## 2013-03-06 DIAGNOSIS — D649 Anemia, unspecified: Secondary | ICD-10-CM | POA: Diagnosis not present

## 2013-03-06 DIAGNOSIS — I2510 Atherosclerotic heart disease of native coronary artery without angina pectoris: Secondary | ICD-10-CM | POA: Diagnosis not present

## 2013-03-11 ENCOUNTER — Ambulatory Visit: Payer: Self-pay

## 2013-03-11 DIAGNOSIS — I2510 Atherosclerotic heart disease of native coronary artery without angina pectoris: Secondary | ICD-10-CM | POA: Diagnosis not present

## 2013-03-11 DIAGNOSIS — J441 Chronic obstructive pulmonary disease with (acute) exacerbation: Secondary | ICD-10-CM | POA: Diagnosis not present

## 2013-03-11 DIAGNOSIS — J449 Chronic obstructive pulmonary disease, unspecified: Secondary | ICD-10-CM | POA: Diagnosis not present

## 2013-03-11 DIAGNOSIS — M329 Systemic lupus erythematosus, unspecified: Secondary | ICD-10-CM | POA: Diagnosis not present

## 2013-03-11 DIAGNOSIS — M459 Ankylosing spondylitis of unspecified sites in spine: Secondary | ICD-10-CM | POA: Diagnosis not present

## 2013-03-11 DIAGNOSIS — R0602 Shortness of breath: Secondary | ICD-10-CM | POA: Diagnosis not present

## 2013-03-11 DIAGNOSIS — E119 Type 2 diabetes mellitus without complications: Secondary | ICD-10-CM | POA: Diagnosis not present

## 2013-03-12 DIAGNOSIS — E119 Type 2 diabetes mellitus without complications: Secondary | ICD-10-CM | POA: Diagnosis not present

## 2013-03-12 DIAGNOSIS — M459 Ankylosing spondylitis of unspecified sites in spine: Secondary | ICD-10-CM | POA: Diagnosis not present

## 2013-03-12 DIAGNOSIS — J441 Chronic obstructive pulmonary disease with (acute) exacerbation: Secondary | ICD-10-CM | POA: Diagnosis not present

## 2013-03-12 DIAGNOSIS — M329 Systemic lupus erythematosus, unspecified: Secondary | ICD-10-CM | POA: Diagnosis not present

## 2013-03-12 DIAGNOSIS — I2510 Atherosclerotic heart disease of native coronary artery without angina pectoris: Secondary | ICD-10-CM | POA: Diagnosis not present

## 2013-03-14 ENCOUNTER — Encounter: Payer: Self-pay | Admitting: Internal Medicine

## 2013-03-17 DIAGNOSIS — E119 Type 2 diabetes mellitus without complications: Secondary | ICD-10-CM | POA: Diagnosis not present

## 2013-03-17 DIAGNOSIS — J441 Chronic obstructive pulmonary disease with (acute) exacerbation: Secondary | ICD-10-CM | POA: Diagnosis not present

## 2013-03-17 DIAGNOSIS — M459 Ankylosing spondylitis of unspecified sites in spine: Secondary | ICD-10-CM | POA: Diagnosis not present

## 2013-03-17 DIAGNOSIS — M329 Systemic lupus erythematosus, unspecified: Secondary | ICD-10-CM | POA: Diagnosis not present

## 2013-03-17 DIAGNOSIS — I2510 Atherosclerotic heart disease of native coronary artery without angina pectoris: Secondary | ICD-10-CM | POA: Diagnosis not present

## 2013-03-23 DIAGNOSIS — E119 Type 2 diabetes mellitus without complications: Secondary | ICD-10-CM | POA: Diagnosis not present

## 2013-03-23 DIAGNOSIS — M459 Ankylosing spondylitis of unspecified sites in spine: Secondary | ICD-10-CM | POA: Diagnosis not present

## 2013-03-23 DIAGNOSIS — M329 Systemic lupus erythematosus, unspecified: Secondary | ICD-10-CM | POA: Diagnosis not present

## 2013-03-23 DIAGNOSIS — I2510 Atherosclerotic heart disease of native coronary artery without angina pectoris: Secondary | ICD-10-CM | POA: Diagnosis not present

## 2013-03-23 DIAGNOSIS — J441 Chronic obstructive pulmonary disease with (acute) exacerbation: Secondary | ICD-10-CM | POA: Diagnosis not present

## 2013-03-25 DIAGNOSIS — R0902 Hypoxemia: Secondary | ICD-10-CM | POA: Diagnosis not present

## 2013-03-25 DIAGNOSIS — R0609 Other forms of dyspnea: Secondary | ICD-10-CM | POA: Diagnosis not present

## 2013-03-25 DIAGNOSIS — J449 Chronic obstructive pulmonary disease, unspecified: Secondary | ICD-10-CM | POA: Diagnosis not present

## 2013-03-26 DIAGNOSIS — J441 Chronic obstructive pulmonary disease with (acute) exacerbation: Secondary | ICD-10-CM | POA: Diagnosis not present

## 2013-03-26 DIAGNOSIS — M459 Ankylosing spondylitis of unspecified sites in spine: Secondary | ICD-10-CM | POA: Diagnosis not present

## 2013-03-26 DIAGNOSIS — M329 Systemic lupus erythematosus, unspecified: Secondary | ICD-10-CM | POA: Diagnosis not present

## 2013-03-26 DIAGNOSIS — E119 Type 2 diabetes mellitus without complications: Secondary | ICD-10-CM | POA: Diagnosis not present

## 2013-03-26 DIAGNOSIS — I2510 Atherosclerotic heart disease of native coronary artery without angina pectoris: Secondary | ICD-10-CM | POA: Diagnosis not present

## 2013-04-03 DIAGNOSIS — E119 Type 2 diabetes mellitus without complications: Secondary | ICD-10-CM | POA: Diagnosis not present

## 2013-04-03 DIAGNOSIS — Z23 Encounter for immunization: Secondary | ICD-10-CM | POA: Diagnosis not present

## 2013-04-03 DIAGNOSIS — E039 Hypothyroidism, unspecified: Secondary | ICD-10-CM | POA: Diagnosis not present

## 2013-04-03 DIAGNOSIS — J449 Chronic obstructive pulmonary disease, unspecified: Secondary | ICD-10-CM | POA: Diagnosis not present

## 2013-04-03 DIAGNOSIS — R002 Palpitations: Secondary | ICD-10-CM | POA: Diagnosis not present

## 2013-04-16 ENCOUNTER — Encounter: Payer: Self-pay | Admitting: Gastroenterology

## 2013-04-16 DIAGNOSIS — I479 Paroxysmal tachycardia, unspecified: Secondary | ICD-10-CM | POA: Diagnosis not present

## 2013-04-16 DIAGNOSIS — J449 Chronic obstructive pulmonary disease, unspecified: Secondary | ICD-10-CM | POA: Diagnosis not present

## 2013-04-16 DIAGNOSIS — E039 Hypothyroidism, unspecified: Secondary | ICD-10-CM | POA: Diagnosis not present

## 2013-04-16 DIAGNOSIS — E119 Type 2 diabetes mellitus without complications: Secondary | ICD-10-CM | POA: Diagnosis not present

## 2013-04-18 ENCOUNTER — Encounter: Payer: Self-pay | Admitting: Internal Medicine

## 2013-04-18 ENCOUNTER — Ambulatory Visit (INDEPENDENT_AMBULATORY_CARE_PROVIDER_SITE_OTHER): Payer: Medicare Other | Admitting: Internal Medicine

## 2013-04-18 VITALS — BP 120/60 | HR 72 | Ht 64.0 in | Wt 110.0 lb

## 2013-04-18 DIAGNOSIS — R1011 Right upper quadrant pain: Secondary | ICD-10-CM

## 2013-04-18 DIAGNOSIS — R10811 Right upper quadrant abdominal tenderness: Secondary | ICD-10-CM | POA: Diagnosis not present

## 2013-04-18 DIAGNOSIS — Z9981 Dependence on supplemental oxygen: Secondary | ICD-10-CM

## 2013-04-18 DIAGNOSIS — J449 Chronic obstructive pulmonary disease, unspecified: Secondary | ICD-10-CM | POA: Diagnosis not present

## 2013-04-18 DIAGNOSIS — J4489 Other specified chronic obstructive pulmonary disease: Secondary | ICD-10-CM

## 2013-04-18 NOTE — Patient Instructions (Addendum)
You have been scheduled for an abdominal ultrasound at [facility] on 04-21-2013 at 10 am. Please arrive 15 minutes prior to your appointment for registration. Make certain not to have anything to eat or drink 6 hours prior to your appointment. Should you need to reschedule your appointment, please contact radiology at [phone]. This test typically takes about 30 minutes to perform.  (Go to [facility] at [facility] and go to admitting and registration)    We will obtain your records for Dr. Leone Payor to review.  I appreciate the opportunity to care for you.

## 2013-04-18 NOTE — Progress Notes (Addendum)
Subjective:    Patient ID: [name], female    DOB: July 25, 1935, 77 y.o.   MRN: [id number]  HPI Patient is an elderly white woman recently moved here from [place], she has settled in [place]. She has a long history of abdominal discomfort and what sounds like IBS, she had a gastroenterologist for 30 years or so in [place]. She says many of her problems then from anti-inflammatory use related to her rheumatologic conditions. She reports upper abdominal pain and mainly on the right upper quadrant radiating into the back and right lower quadrant lately. It is bothersome. Fairly persistent. Similar to what she's had in the past maybe a little bit worse. She is currently on ranitidine. She'll use PPIs in the past, she was hospitalized in [place] with a COPD flare in the summer and was put on pantoprazole but she stopped using that doesn't think that she is much different. She is not complaining of nausea vomiting at this time. Heartburn is generally under control. She has lost some weight. She recently sold Marletta Lor NP, and was describing intermittent problems with painful left eye and what sounds like possible uveitis. However that is better she was treated with steroid drops. She had an EGD and a colonoscopy in 2013 that showed inflammation in some areas but I don't have more says that information at this time. Allergies  Allergen Reactions  . Latex   . Penicillins Hives  . Sulfa Antibiotics   . Erythromycin Rash   Outpatient Prescriptions Prior to Visit  Medication Sig Dispense Refill  . fluorometholone (FML) 0.1 % ophthalmic suspension 1 drop 4 (four) times daily.      Marland Kitchen levothyroxine (SYNTHROID, LEVOTHROID) 25 MCG tablet Take 25 mcg by mouth daily.      Marland Kitchen LORazepam (ATIVAN) 1 MG tablet Take 1 mg by mouth daily.      . metoprolol succinate (TOPROL-XL) 25 MG 24 hr tablet Take 12.5 mg by mouth daily.      . ranitidine (ZANTAC) 150 MG tablet Take 150 mg by mouth  2 (two) times daily.      Marland Kitchen tiotropium (SPIRIVA) 18 MCG inhalation capsule Place 18 mcg into inhaler and inhale daily.      Marland Kitchen zolpidem (AMBIEN) 10 MG tablet Take 0.5 mg by mouth at bedtime as needed for sleep.       No facility-administered medications prior to visit.   Past Medical History  Diagnosis Date  . DM (diabetes mellitus)   . Mitral valve prolapse   . COPD (chronic obstructive pulmonary disease)   . Palpitations   . Hypothyroidism   . Osteoarthritis   . Systemic lupus erythematosus   . Lumbar spondylolysis   . Raynaud's disease   . Anxiety   . IBS (irritable bowel syndrome)   . Uveitis, anterior     ????  . Rheumatic fever   . Lupus     ? how diagnosed  . Ankylosing spondylitis     ? how diagnosed   Past Surgical History  Procedure Laterality Date  . Esophagogastroduodenoscopy  multiple  . Colonoscopy  multiple  . Total abdominal hysterectomy w/ bilateral salpingoophorectomy  1990   History   Social History  . Marital Status: Married    Spouse Name: N/A    Number of Children: 4  . Years of Education: N/A   Occupational History  . retired    Social History Main Topics  . Smoking status: Former Games developer  . Smokeless  tobacco: Never Used     Comment: Quit 4 months ago   . Alcohol Use: No  . Drug Use: No  . Sexual Activity: None   Other Topics Concern  . None   Social History Narrative   Married (#2)   Moved from [place] in Summer 2014 - Lives in [place]   1 son, 3 daughters (one lives in this area)   2 caffeinated beverages daily   04/18/2013 update   FHx. IBD, breast and ovarian cancer, diabetes, IBS  Review of Systems Positive for allergies anxiety back pain some headaches dry mouth and dyspnea. She uses oxygen when necessary. All other review of systems negative    Objective:   Physical Exam General:  Well-developed, well-nourished and in no acute distress - mildly chronically ill Eyes:  anicteric. ENT:   Mouth and posterior pharynx  free of lesions.  Neck:   supple w/o thyromegaly or mass.  Lungs: Clear to auscultation bilaterally.Diffusely decreased bs, mild, prolonged expiratory phase Heart:  S1S2, no rubs, murmurs, gallops. Abdomen:  soft, tender mild-mod RUQ, epigastric worse w/ mm tension no hepatosplenomegaly, hernia, or mass and BS+.  Lymph:  no cervical or supraclavicular adenopathy. Extremities:   no edema Skin   no rash. Neuro:  A&O x 3.  Psych:  appropriate mood and  Affect.   Data Reviewed: Have requested [facility] records - endoscopy, pathology, imaging, notes going to 2011    Assessment & Plan:  RUQ pain - Plan: US Abdomen Complete  RUQ abdominal tenderness - Plan: US Abdomen Complete  On home oxygen therapy  COPD (chronic obstructive pulmonary disease)   Records review - [facility]  EGD 12/2011 chronic gastritis, normal duodenum   05/2012 EGD and colonoscopy normal duodenal bxs mnormal random colon biopsies and stool studies all negative  Records indicate hx chronic pain and diarrhea  I suspect IBS.  Will notify her I have reviewed records  Iva Boop, MD, [facility]

## 2013-04-19 ENCOUNTER — Encounter: Payer: Self-pay | Admitting: Internal Medicine

## 2013-04-19 DIAGNOSIS — Z9981 Dependence on supplemental oxygen: Secondary | ICD-10-CM | POA: Insufficient documentation

## 2013-04-19 DIAGNOSIS — J449 Chronic obstructive pulmonary disease, unspecified: Secondary | ICD-10-CM | POA: Insufficient documentation

## 2013-04-19 DIAGNOSIS — R1011 Right upper quadrant pain: Secondary | ICD-10-CM | POA: Insufficient documentation

## 2013-04-19 NOTE — Assessment & Plan Note (Addendum)
Cause not clear but sounds chronic and functional ? Component of musculoskeletal as she had increase or persistence with muscle tension - Check [place] and check old records She did not seem to want to go back to PPI My sense is unlikely to help anyway [place] ok 04/22/2013

## 2013-04-21 ENCOUNTER — Ambulatory Visit: Payer: Self-pay | Admitting: Internal Medicine

## 2013-04-21 DIAGNOSIS — R1011 Right upper quadrant pain: Secondary | ICD-10-CM | POA: Diagnosis not present

## 2013-04-22 ENCOUNTER — Telehealth: Payer: Self-pay

## 2013-04-22 NOTE — Telephone Encounter (Signed)
Message copied by Annett Fabian on Tue Apr 22, 2013 11:00 AM ------      Message from: Iva Boop      Created: Tue Apr 22, 2013 10:45 AM      Regarding: [place] results from [facility]       Let her know the [place] is ok      Awaiting records from [facility] ------

## 2013-04-22 NOTE — Telephone Encounter (Signed)
Patient notified

## 2013-04-23 ENCOUNTER — Encounter: Payer: Self-pay | Admitting: *Deleted

## 2013-04-24 ENCOUNTER — Encounter: Payer: Self-pay | Admitting: Cardiovascular Disease

## 2013-04-24 ENCOUNTER — Ambulatory Visit (INDEPENDENT_AMBULATORY_CARE_PROVIDER_SITE_OTHER): Payer: Medicare Other | Admitting: Cardiovascular Disease

## 2013-04-24 VITALS — BP 122/73 | HR 80 | Ht 64.0 in | Wt 111.8 lb

## 2013-04-24 DIAGNOSIS — R079 Chest pain, unspecified: Secondary | ICD-10-CM

## 2013-04-24 DIAGNOSIS — R002 Palpitations: Secondary | ICD-10-CM | POA: Diagnosis not present

## 2013-04-24 DIAGNOSIS — R0600 Dyspnea, unspecified: Secondary | ICD-10-CM

## 2013-04-24 DIAGNOSIS — R0609 Other forms of dyspnea: Secondary | ICD-10-CM | POA: Diagnosis not present

## 2013-04-24 NOTE — Patient Instructions (Signed)
Your physician has requested that you have an echocardiogram. Echocardiography is a painless test that uses sound waves to create images of your heart. It provides your doctor with information about the size and shape of your heart and how well your heart's chambers and valves are working. This procedure takes approximately one hour. There are no restrictions for this procedure.  Your physician has recommended that you wear an event monitor. Event monitors are medical devices that record the heart's electrical activity. Doctors most often [place] these monitors to diagnose arrhythmias. Arrhythmias are problems with the speed or rhythm of the heartbeat. The monitor is a small, portable device. You can wear one while you do your normal daily activities. This is usually used to diagnose what is causing palpitations/syncope (passing out).  Follow up after tests.

## 2013-04-25 DIAGNOSIS — R002 Palpitations: Secondary | ICD-10-CM | POA: Insufficient documentation

## 2013-04-25 DIAGNOSIS — R0600 Dyspnea, unspecified: Secondary | ICD-10-CM | POA: Insufficient documentation

## 2013-04-25 NOTE — Assessment & Plan Note (Signed)
The patient is having episodes of recurrent symptomatic tachycardia. Differential diagnosis includes supraventricular tachycardia or paroxysmal atrial fibrillation. The symptoms are not happening frequently enough to be captured on a Holter monitor. Thus, I recommend a 30 day outpatient telemetry.

## 2013-04-25 NOTE — Progress Notes (Signed)
Primary care physician: Dr. Judithann Graves  HPI  This is a 77 year old female who was referred for evaluation of palpitations and tachycardia. She reports being told of a heart murmur associated with palpitations and 2007. She recalls undergoing a stress test which was unremarkable. She has chronic medical conditions that include hypothyroidism, type 2 diabetes, COPD with active smoking. In July of 2014, she was under significant stress after the death of her sister. She experienced significant palpitations and tachycardia with heart rates reaching as high as 170 beats per minute. She had a questionable syncopal episode at that time and was hospitalized in [place]. She reports being placed on a beta blocker but she stopped taking the medication after her dyspnea worsened. She continues to experience frequent episodes of palpitations but not on a daily basis. She's she feels her heart rate going up suddenly and then going back to normal. This has been associated with dizziness. She also notes a significant exertional palpitations and dyspnea. She has occasional chest discomfort with sore. Chest. There is a reported history of SLE. Palpitations improved after regulating thyroid function.  Allergies  Allergen Reactions  . Latex   . Penicillins Hives  . Sulfa Antibiotics   . Erythromycin Rash     Current Outpatient Prescriptions on File Prior to Visit  Medication Sig Dispense Refill  . albuterol (PROVENTIL HFA;VENTOLIN HFA) 108 (90 BASE) MCG/ACT inhaler Inhale into the lungs every 6 (six) hours as needed for wheezing or shortness of breath.      . AMBULATORY NON FORMULARY MEDICATION Oxygen (USES AS NEEDED)      . fluorometholone (FML) 0.1 % ophthalmic suspension 1 drop 4 (four) times daily.      Marland Kitchen levothyroxine (SYNTHROID, LEVOTHROID) 25 MCG tablet Take 25 mcg by mouth daily.      Marland Kitchen LORazepam (ATIVAN) 1 MG tablet Take 1 mg by mouth as needed.       . mometasone-formoterol (DULERA) 100-5 MCG/ACT AERO  Inhale 2 puffs into the lungs 2 (two) times daily.      . nateglinide (STARLIX) 120 MG tablet Take 120 mg by mouth 3 (three) times daily with meals.      . pantoprazole (PROTONIX) 40 MG tablet Take 40 mg by mouth daily.      . ranitidine (ZANTAC) 150 MG tablet Take 150 mg by mouth 2 (two) times daily.      . sitaGLIPtin (JANUVIA) 50 MG tablet Take 50 mg by mouth daily.      Marland Kitchen tiotropium (SPIRIVA) 18 MCG inhalation capsule Place 18 mcg into inhaler and inhale daily.      Marland Kitchen zolpidem (AMBIEN) 10 MG tablet Take 10 mg by mouth at bedtime.       . metoprolol succinate (TOPROL-XL) 25 MG 24 hr tablet Take 12.5 mg by mouth daily.       No current facility-administered medications on file prior to visit.     Past Medical History  Diagnosis Date  . DM (diabetes mellitus)   . Mitral valve prolapse   . COPD (chronic obstructive pulmonary disease)   . Palpitations   . Osteoarthritis   . Systemic lupus erythematosus   . Lumbar spondylolysis   . Raynaud's disease   . Anxiety   . IBS (irritable bowel syndrome)   . Uveitis, anterior     ????  . Rheumatic fever   . Lupus     ? how diagnosed  . Ankylosing spondylitis     ? how diagnosed  . Insomnia, unspecified   .  Chronic peptic ulcer, unspecified site, without mention of hemorrhage, perforation, or obstruction   . Paroxysmal tachycardia, unspecified   . History of kidney stones   . Chronic kidney infection   . Hypothyroidism     mass  . Asthma     as child     Past Surgical History  Procedure Laterality Date  . Esophagogastroduodenoscopy  multiple  . Colonoscopy  multiple  . Total abdominal hysterectomy w/ bilateral salpingoophorectomy  1990  . Breast biopsy    . Biopsy thyroid       Family History  Problem Relation Age of Onset  . Inflammatory bowel disease    . Ovarian cancer    . Diabetes    . Heart disease    . Irritable bowel syndrome    . Kidney disease    . Heart disease Mother   . Hypothyroidism Mother   .  Diabetes Mother   . Heart attack Mother   . Hypertension Mother   . Heart disease Father   . Heart attack Father   . Breast cancer Sister   . Hypothyroidism Sister   . Diabetes Sister   . Ovarian cancer Sister   . Diabetes Brother   . Prostate cancer Brother   . Hypothyroidism Daughter      History   Social History  . Marital Status: Married    Spouse Name: N/A    Number of Children: 4  . Years of Education: N/A   Occupational History  . retired    Social History Main Topics  . Smoking status: Current Every Day Smoker -- 0.25 packs/day for 40 years    Types: Cigarettes  . Smokeless tobacco: Never Used     Comment: Quit 4 months ago   . Alcohol Use: No  . Drug Use: No  . Sexual Activity: Not on file   Other Topics Concern  . Not on file   Social History Narrative   Married (#2)   Moved from [place] in Summer 2014 - Lives in [place]   1 son, 3 daughters (one lives in this area)   2 caffeinated beverages daily   04/18/2013 update     ROS A 10 point review of system was performed. It is negative other than that mentioned in the history of present illness.   PHYSICAL EXAM   BP 122/73  Pulse 80  Ht 5\' 4"  (1.626 m)  Wt 111 lb 12 oz (50.689 kg)  BMI 19.17 kg/m2 Constitutional: She is oriented to person, place, and time. She appears well-developed and well-nourished. No distress.  HENT: No nasal discharge.  Head: Normocephalic and atraumatic.  Eyes: Pupils are equal and round. No discharge.  Neck: Normal range of motion. Neck supple. No JVD present. No thyromegaly present.  Cardiovascular: Normal rate, regular rhythm, normal heart sounds. Exam reveals no gallop and no friction rub. No murmur heard.  Pulmonary/Chest: Effort normal and breath sounds normal. No stridor. No respiratory distress. She has no wheezes. She has no rales. She exhibits no tenderness.  Abdominal: Soft. Bowel sounds are normal. She exhibits no distension. There is no tenderness. There  is no rebound and no guarding.  Musculoskeletal: Normal range of motion. She exhibits no edema and no tenderness.  Neurological: She is alert and oriented to person, place, and time. Coordination normal.  Skin: Skin is warm and dry. No rash noted. She is not diaphoretic. No erythema. No pallor.  Psychiatric: She has a normal mood and affect. Her behavior  is normal. Judgment and thought content normal.     BJY:NWGNF  Rhythm  -Right atrial enlargement.   -Prominent R(V1) and right axis -consider right ventricular hypertrophy.   -Nonspecific ST depression.   ABNORMAL     ASSESSMENT AND PLAN

## 2013-04-25 NOTE — Assessment & Plan Note (Signed)
She has significant exertional dyspnea which could be due to her lung disease. However, EKG is showing evidence of right atrial and right ventricular enlargement which could reflect pulmonary hypertension. I recommend an echocardiogram for evaluation. If echocardiogram is nonrevealing, I plan on proceeding with ischemic cardiac evaluation given her multiple risk factors.

## 2013-04-29 ENCOUNTER — Telehealth: Payer: Self-pay

## 2013-04-29 NOTE — Telephone Encounter (Signed)
LMOM to have patient call regarding the Ecardio monitor placement. Ecardio has tried to contact her to have the monitor placed.

## 2013-05-01 ENCOUNTER — Telehealth: Payer: Self-pay

## 2013-05-01 NOTE — Telephone Encounter (Signed)
LMOM to have patient call regarding Ecardio monitor. Ecardio has attempted to contact the patient numerous times but has not been successful. Please advise.

## 2013-05-02 NOTE — Telephone Encounter (Signed)
Please send her a letter. If she does not respond, let her PCP know.

## 2013-05-05 ENCOUNTER — Other Ambulatory Visit: Payer: Medicare Other

## 2013-05-05 DIAGNOSIS — R0989 Other specified symptoms and signs involving the circulatory and respiratory systems: Secondary | ICD-10-CM

## 2013-05-07 NOTE — Telephone Encounter (Signed)
A letter was mailed to patient regarding the Ecardio placement and to call the office.

## 2013-05-07 NOTE — Telephone Encounter (Signed)
LMOM for patient to call regarding monitor.

## 2013-05-15 DIAGNOSIS — R002 Palpitations: Secondary | ICD-10-CM | POA: Diagnosis not present

## 2013-05-16 ENCOUNTER — Other Ambulatory Visit (INDEPENDENT_AMBULATORY_CARE_PROVIDER_SITE_OTHER): Payer: Medicare Other

## 2013-05-16 ENCOUNTER — Other Ambulatory Visit: Payer: Self-pay

## 2013-05-16 DIAGNOSIS — R079 Chest pain, unspecified: Secondary | ICD-10-CM

## 2013-05-16 DIAGNOSIS — R0600 Dyspnea, unspecified: Secondary | ICD-10-CM

## 2013-05-16 DIAGNOSIS — R0609 Other forms of dyspnea: Secondary | ICD-10-CM

## 2013-05-20 ENCOUNTER — Other Ambulatory Visit: Payer: Self-pay | Admitting: *Deleted

## 2013-05-20 ENCOUNTER — Encounter: Payer: Self-pay | Admitting: *Deleted

## 2013-05-20 DIAGNOSIS — R0600 Dyspnea, unspecified: Secondary | ICD-10-CM

## 2013-05-27 ENCOUNTER — Encounter: Payer: Self-pay | Admitting: Cardiovascular Disease

## 2013-05-27 ENCOUNTER — Ambulatory Visit: Payer: Self-pay | Admitting: Cardiovascular Disease

## 2013-05-27 ENCOUNTER — Ambulatory Visit (INDEPENDENT_AMBULATORY_CARE_PROVIDER_SITE_OTHER): Payer: Medicare Other | Admitting: Cardiovascular Disease

## 2013-05-27 VITALS — BP 127/80 | HR 81 | Ht 64.0 in | Wt 110.5 lb

## 2013-05-27 DIAGNOSIS — J449 Chronic obstructive pulmonary disease, unspecified: Secondary | ICD-10-CM

## 2013-05-27 DIAGNOSIS — R002 Palpitations: Secondary | ICD-10-CM

## 2013-05-27 DIAGNOSIS — R0609 Other forms of dyspnea: Secondary | ICD-10-CM

## 2013-05-27 DIAGNOSIS — R0600 Dyspnea, unspecified: Secondary | ICD-10-CM

## 2013-05-27 DIAGNOSIS — R0989 Other specified symptoms and signs involving the circulatory and respiratory systems: Secondary | ICD-10-CM

## 2013-05-27 NOTE — Progress Notes (Signed)
Primary care physician: Dr. Judithann Graves  HPI  This is a 77 -year-old female who is here today for a followup visit regarding  palpitations, dyspnea and tachycardia. She reports being told of a heart murmur associated with palpitations and 2007. She has chronic medical conditions that include hypothyroidism, type 2 diabetes, COPD with active smoking. In July of 2014, she was under significant stress after the death of her sister. She experienced significant palpitations and tachycardia with heart rates reaching as high as 170 beats per minute. She had a questionable syncopal episode at that time and was hospitalized in [place]. She reports being placed on a beta blocker but she stopped taking the medication after her dyspnea worsened. She was seen recently for recurrent palpitations associated with dizziness.  There is a reported history of SLE. Palpitations improved after regulating thyroid function.  She also had significant exertional dyspnea and upper abdominal pain mostly exercise induced. She continues to feel very tired and fatigue. She underwent an echocardiogram which overall was unremarkable. She had a stress test done today but the results are still not available. She is also wearing an outpatient telemetry. She did have recent weight loss but this has improved after she increased her food intake.  Allergies  Allergen Reactions  . Latex   . Penicillins Hives  . Sulfa Antibiotics   . Erythromycin Rash     Current Outpatient Prescriptions on File Prior to Visit  Medication Sig Dispense Refill  . albuterol (PROVENTIL HFA;VENTOLIN HFA) 108 (90 BASE) MCG/ACT inhaler Inhale into the lungs every 6 (six) hours as needed for wheezing or shortness of breath.      . AMBULATORY NON FORMULARY MEDICATION Oxygen (USES AS NEEDED)      . fluorometholone (FML) 0.1 % ophthalmic suspension 1 drop 4 (four) times daily.      Marland Kitchen levothyroxine (SYNTHROID, LEVOTHROID) 25 MCG tablet Take 25 mcg by mouth  daily.      Marland Kitchen LORazepam (ATIVAN) 1 MG tablet Take 1 mg by mouth as needed.       . mometasone-formoterol (DULERA) 100-5 MCG/ACT AERO Inhale 2 puffs into the lungs 2 (two) times daily.      . ranitidine (ZANTAC) 150 MG tablet Take 150 mg by mouth 2 (two) times daily.      Marland Kitchen tiotropium (SPIRIVA) 18 MCG inhalation capsule Place 18 mcg into inhaler and inhale daily.      Marland Kitchen zolpidem (AMBIEN) 10 MG tablet Take 10 mg by mouth at bedtime.        No current facility-administered medications on file prior to visit.     Past Medical History  Diagnosis Date  . DM (diabetes mellitus)   . Mitral valve prolapse   . COPD (chronic obstructive pulmonary disease)   . Palpitations   . Osteoarthritis   . Systemic lupus erythematosus   . Lumbar spondylolysis   . Raynaud's disease   . Anxiety   . IBS (irritable bowel syndrome)   . Uveitis, anterior     ????  . Rheumatic fever   . Lupus     ? how diagnosed  . Ankylosing spondylitis     ? how diagnosed  . Insomnia, unspecified   . Chronic peptic ulcer, unspecified site, without mention of hemorrhage, perforation, or obstruction   . Paroxysmal tachycardia, unspecified   . History of kidney stones   . Chronic kidney infection   . Hypothyroidism     mass  . Asthma     as child  Past Surgical History  Procedure Laterality Date  . Esophagogastroduodenoscopy  multiple  . Colonoscopy  multiple  . Total abdominal hysterectomy w/ bilateral salpingoophorectomy  1990  . Breast biopsy    . Biopsy thyroid       Family History  Problem Relation Age of Onset  . Inflammatory bowel disease    . Ovarian cancer    . Diabetes    . Heart disease    . Irritable bowel syndrome    . Kidney disease    . Heart disease Mother   . Hypothyroidism Mother   . Diabetes Mother   . Heart attack Mother   . Hypertension Mother   . Heart disease Father   . Heart attack Father   . Breast cancer Sister   . Hypothyroidism Sister   . Diabetes Sister   .  Ovarian cancer Sister   . Diabetes Brother   . Prostate cancer Brother   . Hypothyroidism Daughter      History   Social History  . Marital Status: Married    Spouse Name: N/A    Number of Children: 4  . Years of Education: N/A   Occupational History  . retired    Social History Main Topics  . Smoking status: Current Every Day Smoker -- 0.25 packs/day for 40 years    Types: Cigarettes  . Smokeless tobacco: Never Used     Comment: Quit 4 months ago   . Alcohol Use: No  . Drug Use: No  . Sexual Activity: Not on file   Other Topics Concern  . Not on file   Social History Narrative   Married (#2)   Moved from [place] in Summer 2014 - Lives in [place]   1 son, 3 daughters (one lives in this area)   2 caffeinated beverages daily   04/18/2013 update     ROS A 10 point review of system was performed. It is negative other than that mentioned in the history of present illness.   PHYSICAL EXAM   BP 127/80  Ht 5\' 4"  (1.626 m)  Wt 110 lb 8 oz (50.122 kg)  BMI 18.96 kg/m2 Constitutional: She is oriented to person, place, and time. She appears well-developed and well-nourished. No distress.  HENT: No nasal discharge.  Head: Normocephalic and atraumatic.  Eyes: Pupils are equal and round. No discharge.  Neck: Normal range of motion. Neck supple. No JVD present. No thyromegaly present.  Cardiovascular: Normal rate, regular rhythm, normal heart sounds. Exam reveals no gallop and no friction rub. No murmur heard.  Pulmonary/Chest: Effort normal and breath sounds normal. No stridor. No respiratory distress. She has no wheezes. She has no rales. She exhibits no tenderness.  Abdominal: Soft. Bowel sounds are normal. She exhibits no distension. There is no tenderness. There is no rebound and no guarding.  Musculoskeletal: Normal range of motion. She exhibits no edema and no tenderness.  Neurological: She is alert and oriented to person, place, and time. Coordination normal.    Skin: Skin is warm and dry. No rash noted. She is not diaphoretic. No erythema. No pallor.  Psychiatric: She has a normal mood and affect. Her behavior is normal. Judgment and thought content normal.     EKG: Normal sinus rhythm with no significant or T wave changes.    ASSESSMENT AND PLAN

## 2013-05-27 NOTE — Assessment & Plan Note (Signed)
I suspect that this is the main cause of her dyspnea. If she continues to have issues with weight loss, consider CT scan of the chest to exclude lung malignancy. She reports having a chest x-ray done and was negative.

## 2013-05-27 NOTE — Patient Instructions (Signed)
Continue same medications.   Follow up in 2 months.  

## 2013-05-27 NOTE — Assessment & Plan Note (Signed)
Outpatient telemetry so far shows only a few PVCs. She will continue to monitor until early January. There has been no evidence of tachycardia so far.

## 2013-05-27 NOTE — Assessment & Plan Note (Signed)
She continues to have significant dyspnea as well as exercise-induced abdominal pain of unclear etiology. Echocardiogram was unremarkable. Stress test was done today and will be reviewed. I will consider cardiac catheterization if she continues to have exertional symptoms with no clear etiology.

## 2013-05-28 ENCOUNTER — Other Ambulatory Visit: Payer: Self-pay

## 2013-05-28 DIAGNOSIS — R0600 Dyspnea, unspecified: Secondary | ICD-10-CM

## 2013-05-29 ENCOUNTER — Ambulatory Visit: Payer: Medicare Other | Admitting: Cardiovascular Disease

## 2013-06-02 ENCOUNTER — Ambulatory Visit (INDEPENDENT_AMBULATORY_CARE_PROVIDER_SITE_OTHER): Payer: Medicare Other

## 2013-06-02 DIAGNOSIS — R002 Palpitations: Secondary | ICD-10-CM

## 2013-06-10 ENCOUNTER — Telehealth: Payer: Self-pay

## 2013-06-10 NOTE — Telephone Encounter (Signed)
Spoke to patient to touch base regarding her [place] records.  We still do not have them from her GI Doctor there.  She is going to try and contact them to get them to fax them to [place] for Dr. Leone Payor to review.

## 2013-06-20 ENCOUNTER — Telehealth: Payer: Self-pay | Admitting: *Deleted

## 2013-06-20 NOTE — Telephone Encounter (Signed)
Informed patient that per Dr. Fletcher Anon her holter results were "normal sinus rhythm with pvc's". Patient verbalized understanding.

## 2013-06-24 ENCOUNTER — Other Ambulatory Visit: Payer: Self-pay

## 2013-06-24 ENCOUNTER — Ambulatory Visit (INDEPENDENT_AMBULATORY_CARE_PROVIDER_SITE_OTHER): Payer: Medicare Other

## 2013-06-24 DIAGNOSIS — R002 Palpitations: Secondary | ICD-10-CM

## 2013-07-01 ENCOUNTER — Telehealth: Payer: Self-pay

## 2013-07-01 NOTE — Telephone Encounter (Signed)
Message copied by Marlon Pel on Tue Jul 01, 2013  3:20 PM ------      Message from: Silvano Rusk E      Created: Mon Jun 30, 2013  5:05 PM      Regarding: records finally reviewed       Please let her know records finally came from [facility] and I have reviewed them            I do not think other studies needed at this time      Suspect IBS      If still having symptoms schedule REV next available not urgent ------

## 2013-07-01 NOTE — Telephone Encounter (Signed)
Patient notified "I am holding my own at this time".   She will call back and schedule an office visit as needed

## 2013-07-03 DIAGNOSIS — R0902 Hypoxemia: Secondary | ICD-10-CM | POA: Diagnosis not present

## 2013-07-03 DIAGNOSIS — J449 Chronic obstructive pulmonary disease, unspecified: Secondary | ICD-10-CM | POA: Diagnosis not present

## 2013-07-03 DIAGNOSIS — Z23 Encounter for immunization: Secondary | ICD-10-CM | POA: Diagnosis not present

## 2013-07-03 DIAGNOSIS — R0989 Other specified symptoms and signs involving the circulatory and respiratory systems: Secondary | ICD-10-CM | POA: Diagnosis not present

## 2013-07-03 DIAGNOSIS — R0609 Other forms of dyspnea: Secondary | ICD-10-CM | POA: Diagnosis not present

## 2013-07-16 ENCOUNTER — Inpatient Hospital Stay: Payer: Self-pay | Admitting: Specialist

## 2013-07-16 DIAGNOSIS — J984 Other disorders of lung: Secondary | ICD-10-CM | POA: Diagnosis not present

## 2013-07-16 DIAGNOSIS — R0902 Hypoxemia: Secondary | ICD-10-CM | POA: Diagnosis not present

## 2013-07-16 DIAGNOSIS — J441 Chronic obstructive pulmonary disease with (acute) exacerbation: Secondary | ICD-10-CM | POA: Diagnosis present

## 2013-07-16 DIAGNOSIS — E119 Type 2 diabetes mellitus without complications: Secondary | ICD-10-CM | POA: Diagnosis not present

## 2013-07-16 DIAGNOSIS — Z9981 Dependence on supplemental oxygen: Secondary | ICD-10-CM | POA: Diagnosis not present

## 2013-07-16 DIAGNOSIS — K2990 Gastroduodenitis, unspecified, without bleeding: Secondary | ICD-10-CM | POA: Diagnosis not present

## 2013-07-16 DIAGNOSIS — F411 Generalized anxiety disorder: Secondary | ICD-10-CM | POA: Diagnosis present

## 2013-07-16 DIAGNOSIS — K219 Gastro-esophageal reflux disease without esophagitis: Secondary | ICD-10-CM | POA: Diagnosis not present

## 2013-07-16 DIAGNOSIS — E039 Hypothyroidism, unspecified: Secondary | ICD-10-CM | POA: Diagnosis not present

## 2013-07-16 DIAGNOSIS — R059 Cough, unspecified: Secondary | ICD-10-CM | POA: Diagnosis not present

## 2013-07-16 DIAGNOSIS — J189 Pneumonia, unspecified organism: Secondary | ICD-10-CM | POA: Diagnosis not present

## 2013-07-16 DIAGNOSIS — J9610 Chronic respiratory failure, unspecified whether with hypoxia or hypercapnia: Secondary | ICD-10-CM | POA: Diagnosis present

## 2013-07-16 DIAGNOSIS — I10 Essential (primary) hypertension: Secondary | ICD-10-CM | POA: Diagnosis not present

## 2013-07-16 DIAGNOSIS — K2970 Gastritis, unspecified, without bleeding: Secondary | ICD-10-CM | POA: Diagnosis not present

## 2013-07-16 DIAGNOSIS — R05 Cough: Secondary | ICD-10-CM | POA: Diagnosis not present

## 2013-07-16 DIAGNOSIS — Z87891 Personal history of nicotine dependence: Secondary | ICD-10-CM | POA: Diagnosis not present

## 2013-07-16 LAB — URINALYSIS, COMPLETE
BLOOD: NEGATIVE
Bilirubin,UR: NEGATIVE
Nitrite: NEGATIVE
PH: 6 (ref 4.5–8.0)
Protein: 30
Specific Gravity: 1.028 (ref 1.003–1.030)

## 2013-07-16 LAB — COMPREHENSIVE METABOLIC PANEL
ALT: 21 U/L (ref 12–78)
Albumin: 2.6 g/dL — ABNORMAL LOW (ref 3.4–5.0)
Alkaline Phosphatase: 84 U/L
BUN: 15 mg/dL (ref 7–18)
Bilirubin,Total: 0.3 mg/dL (ref 0.2–1.0)
CO2: 37 mmol/L — AB (ref 21–32)
Calcium, Total: 9.1 mg/dL (ref 8.5–10.1)
Chloride: 96 mmol/L — ABNORMAL LOW (ref 98–107)
Creatinine: 0.77 mg/dL (ref 0.60–1.30)
EGFR (African American): >60
EGFR (Non-African Amer.): >60
Glucose: 315 mg/dL — ABNORMAL HIGH (ref 65–99)
OSMOLALITY: 277 (ref 275–301)
POTASSIUM: 3.9 mmol/L (ref 3.5–5.1)
SGOT(AST): 26 U/L (ref 15–37)
Sodium: 132 mmol/L — ABNORMAL LOW (ref 136–145)
TOTAL PROTEIN: 7 g/dL (ref 6.4–8.2)

## 2013-07-16 LAB — CBC
HCT: 36.6 % (ref 35.0–47.0)
HGB: 12.2 g/dL (ref 12.0–16.0)
MCH: 32 pg (ref 26.0–34.0)
MCHC: 33.4 g/dL (ref 32.0–36.0)
MCV: 96 fL (ref 80–100)
Platelet: 142 10*3/uL — ABNORMAL LOW (ref 150–440)
RBC: 3.82 10*6/uL (ref 3.80–5.20)
RDW: 12.4 % (ref 11.5–14.5)
WBC: 8 10*3/uL (ref 3.6–11.0)

## 2013-07-16 LAB — CK TOTAL AND CKMB (NOT AT ARMC)
CK, Total: 61 U/L
CK-MB: 1 ng/mL (ref 0.5–3.6)

## 2013-07-16 LAB — RAPID INFLUENZA A&B ANTIGENS (ARMC ONLY)

## 2013-07-16 LAB — PRO B NATRIURETIC PEPTIDE: B-Type Natriuretic Peptide: 219 pg/mL (ref 0–450)

## 2013-07-16 LAB — TROPONIN I

## 2013-07-17 LAB — BASIC METABOLIC PANEL
Anion Gap: 2 — ABNORMAL LOW (ref 7–16)
BUN: 14 mg/dL (ref 7–18)
CHLORIDE: 97 mmol/L — AB (ref 98–107)
Calcium, Total: 9.6 mg/dL (ref 8.5–10.1)
Co2: 34 mmol/L — ABNORMAL HIGH (ref 21–32)
Creatinine: 0.62 mg/dL (ref 0.60–1.30)
EGFR (African American): >60
Glucose: 250 mg/dL — ABNORMAL HIGH (ref 65–99)
OSMOLALITY: 275 (ref 275–301)
Potassium: 4.5 mmol/L (ref 3.5–5.1)
SODIUM: 133 mmol/L — AB (ref 136–145)

## 2013-07-17 LAB — CBC WITH DIFFERENTIAL/PLATELET
Basophil #: 0 10*3/uL (ref 0.0–0.1)
Basophil %: 0 %
Eosinophil #: 0 10*3/uL (ref 0.0–0.7)
Eosinophil %: 0 %
HCT: 33.9 % — AB (ref 35.0–47.0)
HGB: 11.4 g/dL — AB (ref 12.0–16.0)
LYMPHS ABS: 0.4 10*3/uL — AB (ref 1.0–3.6)
Lymphocyte %: 9.9 %
MCH: 32.1 pg (ref 26.0–34.0)
MCHC: 33.7 g/dL (ref 32.0–36.0)
MCV: 95 fL (ref 80–100)
MONOS PCT: 5.5 %
Monocyte #: 0.2 x10 3/mm (ref 0.2–0.9)
Neutrophil #: 3.7 10*3/uL (ref 1.4–6.5)
Neutrophil %: 84.6 %
Platelet: 152 10*3/uL (ref 150–440)
RBC: 3.56 10*6/uL — ABNORMAL LOW (ref 3.80–5.20)
RDW: 12.7 % (ref 11.5–14.5)
WBC: 4.4 10*3/uL (ref 3.6–11.0)

## 2013-07-21 LAB — CULTURE, BLOOD (SINGLE)

## 2013-07-28 ENCOUNTER — Ambulatory Visit: Payer: Medicare Other | Admitting: Cardiovascular Disease

## 2013-08-05 ENCOUNTER — Ambulatory Visit: Payer: Medicare Other | Admitting: Cardiovascular Disease

## 2013-08-18 ENCOUNTER — Ambulatory Visit (INDEPENDENT_AMBULATORY_CARE_PROVIDER_SITE_OTHER): Payer: Medicare Other | Admitting: Cardiovascular Disease

## 2013-08-18 ENCOUNTER — Encounter: Payer: Self-pay | Admitting: Cardiovascular Disease

## 2013-08-18 VITALS — BP 119/79 | HR 77 | Ht 64.0 in | Wt 111.2 lb

## 2013-08-18 DIAGNOSIS — R0609 Other forms of dyspnea: Secondary | ICD-10-CM | POA: Diagnosis not present

## 2013-08-18 DIAGNOSIS — R0600 Dyspnea, unspecified: Secondary | ICD-10-CM

## 2013-08-18 DIAGNOSIS — R002 Palpitations: Secondary | ICD-10-CM

## 2013-08-18 DIAGNOSIS — R0989 Other specified symptoms and signs involving the circulatory and respiratory systems: Secondary | ICD-10-CM

## 2013-08-18 DIAGNOSIS — R079 Chest pain, unspecified: Secondary | ICD-10-CM | POA: Diagnosis not present

## 2013-08-18 NOTE — Progress Notes (Signed)
Primary care physician: Dr. Army Melia  HPI  This is a 78 -year-old female who is here today for a followup visit regarding  palpitations, dyspnea and tachycardia. She has chronic medical conditions that include hypothyroidism, type 2 diabetes, COPD with previous history of smoking. In July of 2014, she was under significant stress after the death of her sister. She experienced significant palpitations and tachycardia with heart rates reaching as high as 170 beats per minute. She had a questionable syncopal episode at that time and was hospitalized in [place]. She reports being placed on a beta blocker but she stopped taking the medication after her dyspnea worsened. Palpitations improved after regulating thyroid function.  Echocardiogram in 05/2013  was unremarkable. Nuclear stress test in December was also normal. Outpatient telemetry showed sinus rhythm with sinus tachycardia and occasional PVCs. No other significant arrhythmia. She was hospitalized in February for 3 days at [facility] for pneumonia. She reports improved palpitations. She only gets palpitations when she is anxious. Symptoms improved when she takes Ativan. She is overall feeling better.  Allergies  Allergen Reactions  . Latex   . Penicillins Hives  . Sulfa Antibiotics   . Erythromycin Rash     Current Outpatient Prescriptions on File Prior to Visit  Medication Sig Dispense Refill  . albuterol (PROVENTIL HFA;VENTOLIN HFA) 108 (90 BASE) MCG/ACT inhaler Inhale into the lungs 4 (four) times daily - after meals and at bedtime.       . AMBULATORY NON FORMULARY MEDICATION Oxygen (USES AS NEEDED)      . levothyroxine (SYNTHROID, LEVOTHROID) 25 MCG tablet Take 25 mcg by mouth daily.      Marland Kitchen LORazepam (ATIVAN) 1 MG tablet Take 0.5 mg by mouth daily as needed.       . mometasone-formoterol (DULERA) 100-5 MCG/ACT AERO Inhale 2 puffs into the lungs 2 (two) times daily.      . ranitidine (ZANTAC) 150 MG tablet Take 150 mg by mouth 2 (two)  times daily.      . sitaGLIPtin (JANUVIA) 100 MG tablet Take 100 mg by mouth daily.      Marland Kitchen tiotropium (SPIRIVA) 18 MCG inhalation capsule Place 18 mcg into inhaler and inhale daily.      Marland Kitchen zolpidem (AMBIEN) 10 MG tablet Take 10 mg by mouth at bedtime.        No current facility-administered medications on file prior to visit.     Past Medical History  Diagnosis Date  . DM (diabetes mellitus)   . Mitral valve prolapse   . COPD (chronic obstructive pulmonary disease)   . Palpitations   . Osteoarthritis   . Systemic lupus erythematosus   . Lumbar spondylolysis   . Raynaud's disease   . Anxiety   . IBS (irritable bowel syndrome)   . Uveitis, anterior     ????  . Rheumatic fever   . Lupus     ? how diagnosed  . Ankylosing spondylitis     ? how diagnosed  . Insomnia, unspecified   . Chronic peptic ulcer, unspecified site, without mention of hemorrhage, perforation, or obstruction   . Paroxysmal tachycardia, unspecified   . History of kidney stones   . Chronic kidney infection   . Hypothyroidism     mass  . Asthma     as child  . Pneumonia      Past Surgical History  Procedure Laterality Date  . Esophagogastroduodenoscopy  multiple  . Colonoscopy  multiple  . Total abdominal hysterectomy w/ bilateral salpingoophorectomy  1990  . Breast biopsy    . Biopsy thyroid       Family History  Problem Relation Age of Onset  . Inflammatory bowel disease    . Ovarian cancer    . Diabetes    . Heart disease    . Irritable bowel syndrome    . Kidney disease    . Heart disease Mother   . Hypothyroidism Mother   . Diabetes Mother   . Heart attack Mother   . Hypertension Mother   . Heart disease Father   . Heart attack Father   . Breast cancer Sister   . Hypothyroidism Sister   . Diabetes Sister   . Ovarian cancer Sister   . Diabetes Brother   . Prostate cancer Brother   . Hypothyroidism Daughter      History   Social History  . Marital Status: Married     Spouse Name: N/A    Number of Children: 4  . Years of Education: N/A   Occupational History  . retired    Social History Main Topics  . Smoking status: Former Smoker -- 0.25 packs/day for 40 years    Types: Cigarettes  . Smokeless tobacco: Never Used     Comment: Quit 4 months ago   . Alcohol Use: No  . Drug Use: No  . Sexual Activity: Not on file   Other Topics Concern  . Not on file   Social History Narrative   Married (#2)   Moved from [place] in Summer 2014 - Lives in [place]   1 son, 3 daughters (one lives in this area)   2 caffeinated beverages daily   04/18/2013 update     ROS A 10 point review of system was performed. It is negative other than that mentioned in the history of present illness.   PHYSICAL EXAM   BP 119/79  Pulse 77  Ht 5\' 4"  (1.626 m)  Wt 111 lb 4 oz (50.463 kg)  BMI 19.09 kg/m2 Constitutional: She is oriented to person, place, and time. She appears well-developed and well-nourished. No distress.  HENT: No nasal discharge.  Head: Normocephalic and atraumatic.  Eyes: Pupils are equal and round. No discharge.  Neck: Normal range of motion. Neck supple. No JVD present. No thyromegaly present.  Cardiovascular: Normal rate, regular rhythm, normal heart sounds. Exam reveals no gallop and no friction rub. No murmur heard.  Pulmonary/Chest: Effort normal and breath sounds normal. No stridor. No respiratory distress. She has no wheezes. She has no rales. She exhibits no tenderness.  Abdominal: Soft. Bowel sounds are normal. She exhibits no distension. There is no tenderness. There is no rebound and no guarding.  Musculoskeletal: Normal range of motion. She exhibits no edema and no tenderness.  Neurological: She is alert and oriented to person, place, and time. Coordination normal.  Skin: Skin is warm and dry. No rash noted. She is not diaphoretic. No erythema. No pallor.  Psychiatric: She has a normal mood and affect. Her behavior is normal.  Judgment and thought content normal.     EKG: Normal sinus rhythm with no significant or T wave changes.    ASSESSMENT AND PLAN

## 2013-08-18 NOTE — Assessment & Plan Note (Signed)
Both echocardiogram and stress test were unremarkable. Dyspnea is likely due to COPD.

## 2013-08-18 NOTE — Patient Instructions (Signed)
Follow up as needed

## 2013-08-18 NOTE — Assessment & Plan Note (Signed)
This seems to be due to sinus tachycardia in the setting of anxiety. Symptoms overall improved. Outpatient telemetry showed no significant arrhythmia. Continue observation and followup as needed for worsening symptoms.

## 2013-08-26 DIAGNOSIS — B370 Candidal stomatitis: Secondary | ICD-10-CM | POA: Diagnosis not present

## 2013-08-26 DIAGNOSIS — J441 Chronic obstructive pulmonary disease with (acute) exacerbation: Secondary | ICD-10-CM | POA: Diagnosis not present

## 2013-08-26 DIAGNOSIS — IMO0001 Reserved for inherently not codable concepts without codable children: Secondary | ICD-10-CM | POA: Diagnosis not present

## 2013-08-26 DIAGNOSIS — G4700 Insomnia, unspecified: Secondary | ICD-10-CM | POA: Diagnosis not present

## 2013-09-24 DIAGNOSIS — H2510 Age-related nuclear cataract, unspecified eye: Secondary | ICD-10-CM | POA: Diagnosis not present

## 2013-10-19 ENCOUNTER — Inpatient Hospital Stay: Payer: Self-pay | Admitting: Internal Medicine

## 2013-10-19 DIAGNOSIS — Z882 Allergy status to sulfonamides status: Secondary | ICD-10-CM | POA: Diagnosis not present

## 2013-10-19 DIAGNOSIS — R0602 Shortness of breath: Secondary | ICD-10-CM | POA: Diagnosis not present

## 2013-10-19 DIAGNOSIS — J449 Chronic obstructive pulmonary disease, unspecified: Secondary | ICD-10-CM | POA: Diagnosis not present

## 2013-10-19 DIAGNOSIS — J441 Chronic obstructive pulmonary disease with (acute) exacerbation: Secondary | ICD-10-CM | POA: Diagnosis not present

## 2013-10-19 DIAGNOSIS — K219 Gastro-esophageal reflux disease without esophagitis: Secondary | ICD-10-CM | POA: Diagnosis present

## 2013-10-19 DIAGNOSIS — A419 Sepsis, unspecified organism: Secondary | ICD-10-CM | POA: Diagnosis not present

## 2013-10-19 DIAGNOSIS — J189 Pneumonia, unspecified organism: Secondary | ICD-10-CM | POA: Diagnosis not present

## 2013-10-19 DIAGNOSIS — R059 Cough, unspecified: Secondary | ICD-10-CM | POA: Diagnosis not present

## 2013-10-19 DIAGNOSIS — R404 Transient alteration of awareness: Secondary | ICD-10-CM | POA: Diagnosis present

## 2013-10-19 DIAGNOSIS — R05 Cough: Secondary | ICD-10-CM | POA: Diagnosis not present

## 2013-10-19 DIAGNOSIS — J440 Chronic obstructive pulmonary disease with acute lower respiratory infection: Secondary | ICD-10-CM | POA: Diagnosis present

## 2013-10-19 DIAGNOSIS — E119 Type 2 diabetes mellitus without complications: Secondary | ICD-10-CM | POA: Diagnosis not present

## 2013-10-19 DIAGNOSIS — M329 Systemic lupus erythematosus, unspecified: Secondary | ICD-10-CM | POA: Diagnosis not present

## 2013-10-19 DIAGNOSIS — K2970 Gastritis, unspecified, without bleeding: Secondary | ICD-10-CM | POA: Diagnosis present

## 2013-10-19 DIAGNOSIS — J4489 Other specified chronic obstructive pulmonary disease: Secondary | ICD-10-CM | POA: Diagnosis not present

## 2013-10-19 DIAGNOSIS — R5381 Other malaise: Secondary | ICD-10-CM | POA: Diagnosis not present

## 2013-10-19 DIAGNOSIS — K2990 Gastroduodenitis, unspecified, without bleeding: Secondary | ICD-10-CM | POA: Diagnosis present

## 2013-10-19 DIAGNOSIS — J9620 Acute and chronic respiratory failure, unspecified whether with hypoxia or hypercapnia: Secondary | ICD-10-CM | POA: Diagnosis present

## 2013-10-19 DIAGNOSIS — R443 Hallucinations, unspecified: Secondary | ICD-10-CM | POA: Diagnosis present

## 2013-10-19 DIAGNOSIS — Z880 Allergy status to penicillin: Secondary | ICD-10-CM | POA: Diagnosis not present

## 2013-10-19 DIAGNOSIS — E039 Hypothyroidism, unspecified: Secondary | ICD-10-CM | POA: Diagnosis not present

## 2013-10-19 DIAGNOSIS — F411 Generalized anxiety disorder: Secondary | ICD-10-CM | POA: Diagnosis present

## 2013-10-19 DIAGNOSIS — R6510 Systemic inflammatory response syndrome (SIRS) of non-infectious origin without acute organ dysfunction: Secondary | ICD-10-CM | POA: Diagnosis not present

## 2013-10-19 DIAGNOSIS — J45901 Unspecified asthma with (acute) exacerbation: Secondary | ICD-10-CM | POA: Diagnosis present

## 2013-10-19 DIAGNOSIS — Z881 Allergy status to other antibiotic agents status: Secondary | ICD-10-CM | POA: Diagnosis not present

## 2013-10-19 DIAGNOSIS — Z87891 Personal history of nicotine dependence: Secondary | ICD-10-CM | POA: Diagnosis not present

## 2013-10-19 DIAGNOSIS — R5383 Other fatigue: Secondary | ICD-10-CM | POA: Diagnosis present

## 2013-10-19 DIAGNOSIS — Z885 Allergy status to narcotic agent status: Secondary | ICD-10-CM | POA: Diagnosis not present

## 2013-10-19 LAB — CBC
HCT: 36.3 % (ref 35.0–47.0)
HGB: 11.6 g/dL — AB (ref 12.0–16.0)
MCH: 30.9 pg (ref 26.0–34.0)
MCHC: 32.1 g/dL (ref 32.0–36.0)
MCV: 96 fL (ref 80–100)
Platelet: 149 10*3/uL — ABNORMAL LOW (ref 150–440)
RBC: 3.77 10*6/uL — AB (ref 3.80–5.20)
RDW: 12.6 % (ref 11.5–14.5)
WBC: 7.8 10*3/uL (ref 3.6–11.0)

## 2013-10-19 LAB — BASIC METABOLIC PANEL
ANION GAP: 3 — AB (ref 7–16)
BUN: 11 mg/dL (ref 7–18)
CALCIUM: 9 mg/dL (ref 8.5–10.1)
CREATININE: 0.69 mg/dL (ref 0.60–1.30)
Chloride: 100 mmol/L (ref 98–107)
Co2: 35 mmol/L — ABNORMAL HIGH (ref 21–32)
EGFR (African American): >60
Glucose: 279 mg/dL — ABNORMAL HIGH (ref 65–99)
Osmolality: 285 (ref 275–301)
Potassium: 4.3 mmol/L (ref 3.5–5.1)
Sodium: 138 mmol/L (ref 136–145)

## 2013-10-19 LAB — TROPONIN I

## 2013-10-20 LAB — BASIC METABOLIC PANEL
Anion Gap: 3 — ABNORMAL LOW (ref 7–16)
BUN: 15 mg/dL (ref 7–18)
CREATININE: 0.84 mg/dL (ref 0.60–1.30)
Calcium, Total: 9.2 mg/dL (ref 8.5–10.1)
Chloride: 101 mmol/L (ref 98–107)
Co2: 32 mmol/L (ref 21–32)
EGFR (African American): >60
Glucose: 278 mg/dL — ABNORMAL HIGH (ref 65–99)
Osmolality: 283 (ref 275–301)
Potassium: 4.9 mmol/L (ref 3.5–5.1)
Sodium: 136 mmol/L (ref 136–145)

## 2013-10-21 LAB — CBC WITH DIFFERENTIAL/PLATELET
BANDS NEUTROPHIL: 5 %
COMMENT - H1-COM1: NORMAL
Comment - H1-Com2: NORMAL
HCT: 34.5 % — ABNORMAL LOW (ref 35.0–47.0)
HGB: 11.1 g/dL — AB (ref 12.0–16.0)
Lymphocytes: 8 %
MCH: 30.7 pg (ref 26.0–34.0)
MCHC: 32.2 g/dL (ref 32.0–36.0)
MCV: 95 fL (ref 80–100)
MONOS PCT: 6 %
Metamyelocyte: 1 %
PLATELETS: 194 10*3/uL (ref 150–440)
RBC: 3.62 10*6/uL — AB (ref 3.80–5.20)
RDW: 12.8 % (ref 11.5–14.5)
Segmented Neutrophils: 80 %
WBC: 10 10*3/uL (ref 3.6–11.0)

## 2013-10-21 LAB — PRO B NATRIURETIC PEPTIDE: B-Type Natriuretic Peptide: 1684 pg/mL — ABNORMAL HIGH (ref 0–450)

## 2013-10-21 LAB — BASIC METABOLIC PANEL
ANION GAP: 2 — AB (ref 7–16)
BUN: 24 mg/dL — ABNORMAL HIGH (ref 7–18)
CALCIUM: 9.7 mg/dL (ref 8.5–10.1)
CO2: 36 mmol/L — AB (ref 21–32)
CREATININE: 0.9 mg/dL (ref 0.60–1.30)
Chloride: 98 mmol/L (ref 98–107)
EGFR (African American): >60
EGFR (Non-African Amer.): >60
Glucose: 277 mg/dL — ABNORMAL HIGH (ref 65–99)
OSMOLALITY: 286 (ref 275–301)
POTASSIUM: 4.8 mmol/L (ref 3.5–5.1)
Sodium: 136 mmol/L (ref 136–145)

## 2013-10-22 LAB — BASIC METABOLIC PANEL
ANION GAP: 1 — AB (ref 7–16)
BUN: 23 mg/dL — ABNORMAL HIGH (ref 7–18)
CHLORIDE: 101 mmol/L (ref 98–107)
Calcium, Total: 9.5 mg/dL (ref 8.5–10.1)
Co2: 35 mmol/L — ABNORMAL HIGH (ref 21–32)
Creatinine: 0.77 mg/dL (ref 0.60–1.30)
Glucose: 212 mg/dL — ABNORMAL HIGH (ref 65–99)
OSMOLALITY: 284 (ref 275–301)
POTASSIUM: 4.5 mmol/L (ref 3.5–5.1)
SODIUM: 137 mmol/L (ref 136–145)

## 2013-10-22 LAB — CBC WITH DIFFERENTIAL/PLATELET
Bands: 4 %
COMMENT - H1-COM1: NORMAL
Comment - H1-Com2: NORMAL
HCT: 32.8 % — ABNORMAL LOW (ref 35.0–47.0)
HGB: 10.9 g/dL — ABNORMAL LOW (ref 12.0–16.0)
Lymphocytes: 13 %
MCH: 31.4 pg (ref 26.0–34.0)
MCHC: 33.3 g/dL (ref 32.0–36.0)
MCV: 95 fL (ref 80–100)
METAMYELOCYTE: 1 %
Monocytes: 6 %
PLATELETS: 188 10*3/uL (ref 150–440)
RBC: 3.47 10*6/uL — ABNORMAL LOW (ref 3.80–5.20)
RDW: 12.7 % (ref 11.5–14.5)
Segmented Neutrophils: 76 %
WBC: 10.2 10*3/uL (ref 3.6–11.0)

## 2013-10-25 LAB — CREATININE, SERUM
Creatinine: 0.73 mg/dL (ref 0.60–1.30)
EGFR (African American): >60
EGFR (Non-African Amer.): >60

## 2013-10-27 DIAGNOSIS — J441 Chronic obstructive pulmonary disease with (acute) exacerbation: Secondary | ICD-10-CM | POA: Diagnosis not present

## 2013-10-27 DIAGNOSIS — Z5189 Encounter for other specified aftercare: Secondary | ICD-10-CM | POA: Diagnosis not present

## 2013-10-27 DIAGNOSIS — E119 Type 2 diabetes mellitus without complications: Secondary | ICD-10-CM | POA: Diagnosis not present

## 2013-10-27 DIAGNOSIS — M6281 Muscle weakness (generalized): Secondary | ICD-10-CM | POA: Diagnosis not present

## 2013-10-28 DIAGNOSIS — J449 Chronic obstructive pulmonary disease, unspecified: Secondary | ICD-10-CM | POA: Insufficient documentation

## 2013-10-28 DIAGNOSIS — R0609 Other forms of dyspnea: Secondary | ICD-10-CM | POA: Diagnosis not present

## 2013-10-28 DIAGNOSIS — R0902 Hypoxemia: Secondary | ICD-10-CM | POA: Diagnosis not present

## 2013-10-29 DIAGNOSIS — E119 Type 2 diabetes mellitus without complications: Secondary | ICD-10-CM | POA: Diagnosis not present

## 2013-10-29 DIAGNOSIS — J441 Chronic obstructive pulmonary disease with (acute) exacerbation: Secondary | ICD-10-CM | POA: Diagnosis not present

## 2013-10-29 DIAGNOSIS — M6281 Muscle weakness (generalized): Secondary | ICD-10-CM | POA: Diagnosis not present

## 2013-10-29 DIAGNOSIS — Z5189 Encounter for other specified aftercare: Secondary | ICD-10-CM | POA: Diagnosis not present

## 2013-10-30 DIAGNOSIS — Z5189 Encounter for other specified aftercare: Secondary | ICD-10-CM | POA: Diagnosis not present

## 2013-10-30 DIAGNOSIS — J441 Chronic obstructive pulmonary disease with (acute) exacerbation: Secondary | ICD-10-CM | POA: Diagnosis not present

## 2013-10-30 DIAGNOSIS — E119 Type 2 diabetes mellitus without complications: Secondary | ICD-10-CM | POA: Diagnosis not present

## 2013-10-30 DIAGNOSIS — M6281 Muscle weakness (generalized): Secondary | ICD-10-CM | POA: Diagnosis not present

## 2013-10-31 DIAGNOSIS — Z5189 Encounter for other specified aftercare: Secondary | ICD-10-CM | POA: Diagnosis not present

## 2013-10-31 DIAGNOSIS — J441 Chronic obstructive pulmonary disease with (acute) exacerbation: Secondary | ICD-10-CM | POA: Diagnosis not present

## 2013-10-31 DIAGNOSIS — E119 Type 2 diabetes mellitus without complications: Secondary | ICD-10-CM | POA: Diagnosis not present

## 2013-10-31 DIAGNOSIS — M6281 Muscle weakness (generalized): Secondary | ICD-10-CM | POA: Diagnosis not present

## 2013-11-04 DIAGNOSIS — Z5189 Encounter for other specified aftercare: Secondary | ICD-10-CM | POA: Diagnosis not present

## 2013-11-04 DIAGNOSIS — J449 Chronic obstructive pulmonary disease, unspecified: Secondary | ICD-10-CM | POA: Diagnosis not present

## 2013-11-04 DIAGNOSIS — IMO0001 Reserved for inherently not codable concepts without codable children: Secondary | ICD-10-CM | POA: Diagnosis not present

## 2013-11-04 DIAGNOSIS — J441 Chronic obstructive pulmonary disease with (acute) exacerbation: Secondary | ICD-10-CM | POA: Diagnosis not present

## 2013-11-04 DIAGNOSIS — E042 Nontoxic multinodular goiter: Secondary | ICD-10-CM | POA: Diagnosis not present

## 2013-11-04 DIAGNOSIS — E119 Type 2 diabetes mellitus without complications: Secondary | ICD-10-CM | POA: Diagnosis not present

## 2013-11-04 DIAGNOSIS — K277 Chronic peptic ulcer, site unspecified, without hemorrhage or perforation: Secondary | ICD-10-CM | POA: Diagnosis not present

## 2013-11-04 DIAGNOSIS — M6281 Muscle weakness (generalized): Secondary | ICD-10-CM | POA: Diagnosis not present

## 2013-11-06 DIAGNOSIS — Z5189 Encounter for other specified aftercare: Secondary | ICD-10-CM | POA: Diagnosis not present

## 2013-11-06 DIAGNOSIS — J441 Chronic obstructive pulmonary disease with (acute) exacerbation: Secondary | ICD-10-CM | POA: Diagnosis not present

## 2013-11-06 DIAGNOSIS — M6281 Muscle weakness (generalized): Secondary | ICD-10-CM | POA: Diagnosis not present

## 2013-11-06 DIAGNOSIS — E119 Type 2 diabetes mellitus without complications: Secondary | ICD-10-CM | POA: Diagnosis not present

## 2013-11-07 DIAGNOSIS — E119 Type 2 diabetes mellitus without complications: Secondary | ICD-10-CM | POA: Diagnosis not present

## 2013-11-07 DIAGNOSIS — J441 Chronic obstructive pulmonary disease with (acute) exacerbation: Secondary | ICD-10-CM | POA: Diagnosis not present

## 2013-11-07 DIAGNOSIS — M6281 Muscle weakness (generalized): Secondary | ICD-10-CM | POA: Diagnosis not present

## 2013-11-07 DIAGNOSIS — Z5189 Encounter for other specified aftercare: Secondary | ICD-10-CM | POA: Diagnosis not present

## 2013-11-10 DIAGNOSIS — Z5189 Encounter for other specified aftercare: Secondary | ICD-10-CM | POA: Diagnosis not present

## 2013-11-11 DIAGNOSIS — E119 Type 2 diabetes mellitus without complications: Secondary | ICD-10-CM | POA: Diagnosis not present

## 2013-11-11 DIAGNOSIS — Z5189 Encounter for other specified aftercare: Secondary | ICD-10-CM | POA: Diagnosis not present

## 2013-11-11 DIAGNOSIS — J441 Chronic obstructive pulmonary disease with (acute) exacerbation: Secondary | ICD-10-CM | POA: Diagnosis not present

## 2013-11-11 DIAGNOSIS — M6281 Muscle weakness (generalized): Secondary | ICD-10-CM | POA: Diagnosis not present

## 2013-11-12 DIAGNOSIS — M6281 Muscle weakness (generalized): Secondary | ICD-10-CM | POA: Diagnosis not present

## 2013-11-12 DIAGNOSIS — E119 Type 2 diabetes mellitus without complications: Secondary | ICD-10-CM | POA: Diagnosis not present

## 2013-11-12 DIAGNOSIS — J441 Chronic obstructive pulmonary disease with (acute) exacerbation: Secondary | ICD-10-CM | POA: Diagnosis not present

## 2013-11-12 DIAGNOSIS — Z5189 Encounter for other specified aftercare: Secondary | ICD-10-CM | POA: Diagnosis not present

## 2013-11-13 DIAGNOSIS — Z5189 Encounter for other specified aftercare: Secondary | ICD-10-CM | POA: Diagnosis not present

## 2013-11-13 DIAGNOSIS — M6281 Muscle weakness (generalized): Secondary | ICD-10-CM | POA: Diagnosis not present

## 2013-11-13 DIAGNOSIS — J441 Chronic obstructive pulmonary disease with (acute) exacerbation: Secondary | ICD-10-CM | POA: Diagnosis not present

## 2013-11-13 DIAGNOSIS — E119 Type 2 diabetes mellitus without complications: Secondary | ICD-10-CM | POA: Diagnosis not present

## 2013-11-14 DIAGNOSIS — M6281 Muscle weakness (generalized): Secondary | ICD-10-CM | POA: Diagnosis not present

## 2013-11-14 DIAGNOSIS — E119 Type 2 diabetes mellitus without complications: Secondary | ICD-10-CM | POA: Diagnosis not present

## 2013-11-14 DIAGNOSIS — J441 Chronic obstructive pulmonary disease with (acute) exacerbation: Secondary | ICD-10-CM | POA: Diagnosis not present

## 2013-11-14 DIAGNOSIS — Z5189 Encounter for other specified aftercare: Secondary | ICD-10-CM | POA: Diagnosis not present

## 2013-11-17 DIAGNOSIS — E119 Type 2 diabetes mellitus without complications: Secondary | ICD-10-CM | POA: Diagnosis not present

## 2013-11-17 DIAGNOSIS — J441 Chronic obstructive pulmonary disease with (acute) exacerbation: Secondary | ICD-10-CM | POA: Diagnosis not present

## 2013-11-17 DIAGNOSIS — M6281 Muscle weakness (generalized): Secondary | ICD-10-CM | POA: Diagnosis not present

## 2013-11-17 DIAGNOSIS — Z5189 Encounter for other specified aftercare: Secondary | ICD-10-CM | POA: Diagnosis not present

## 2013-11-19 DIAGNOSIS — M6281 Muscle weakness (generalized): Secondary | ICD-10-CM | POA: Diagnosis not present

## 2013-11-19 DIAGNOSIS — E119 Type 2 diabetes mellitus without complications: Secondary | ICD-10-CM | POA: Diagnosis not present

## 2013-11-19 DIAGNOSIS — Z5189 Encounter for other specified aftercare: Secondary | ICD-10-CM | POA: Diagnosis not present

## 2013-11-19 DIAGNOSIS — J441 Chronic obstructive pulmonary disease with (acute) exacerbation: Secondary | ICD-10-CM | POA: Diagnosis not present

## 2013-11-21 DIAGNOSIS — Z5189 Encounter for other specified aftercare: Secondary | ICD-10-CM | POA: Diagnosis not present

## 2013-11-21 DIAGNOSIS — J441 Chronic obstructive pulmonary disease with (acute) exacerbation: Secondary | ICD-10-CM | POA: Diagnosis not present

## 2013-11-21 DIAGNOSIS — E119 Type 2 diabetes mellitus without complications: Secondary | ICD-10-CM | POA: Diagnosis not present

## 2013-11-21 DIAGNOSIS — M6281 Muscle weakness (generalized): Secondary | ICD-10-CM | POA: Diagnosis not present

## 2013-11-24 DIAGNOSIS — J441 Chronic obstructive pulmonary disease with (acute) exacerbation: Secondary | ICD-10-CM | POA: Diagnosis not present

## 2013-11-24 DIAGNOSIS — M6281 Muscle weakness (generalized): Secondary | ICD-10-CM | POA: Diagnosis not present

## 2013-11-24 DIAGNOSIS — Z5189 Encounter for other specified aftercare: Secondary | ICD-10-CM | POA: Diagnosis not present

## 2013-11-24 DIAGNOSIS — E119 Type 2 diabetes mellitus without complications: Secondary | ICD-10-CM | POA: Diagnosis not present

## 2013-11-25 DIAGNOSIS — Z5189 Encounter for other specified aftercare: Secondary | ICD-10-CM | POA: Diagnosis not present

## 2013-11-25 DIAGNOSIS — J441 Chronic obstructive pulmonary disease with (acute) exacerbation: Secondary | ICD-10-CM | POA: Diagnosis not present

## 2013-11-25 DIAGNOSIS — E119 Type 2 diabetes mellitus without complications: Secondary | ICD-10-CM | POA: Diagnosis not present

## 2013-11-25 DIAGNOSIS — M6281 Muscle weakness (generalized): Secondary | ICD-10-CM | POA: Diagnosis not present

## 2013-11-27 DIAGNOSIS — M6281 Muscle weakness (generalized): Secondary | ICD-10-CM | POA: Diagnosis not present

## 2013-11-27 DIAGNOSIS — J441 Chronic obstructive pulmonary disease with (acute) exacerbation: Secondary | ICD-10-CM | POA: Diagnosis not present

## 2013-11-27 DIAGNOSIS — Z5189 Encounter for other specified aftercare: Secondary | ICD-10-CM | POA: Diagnosis not present

## 2013-11-27 DIAGNOSIS — E119 Type 2 diabetes mellitus without complications: Secondary | ICD-10-CM | POA: Diagnosis not present

## 2013-12-01 DIAGNOSIS — J441 Chronic obstructive pulmonary disease with (acute) exacerbation: Secondary | ICD-10-CM | POA: Diagnosis not present

## 2013-12-01 DIAGNOSIS — M6281 Muscle weakness (generalized): Secondary | ICD-10-CM | POA: Diagnosis not present

## 2013-12-01 DIAGNOSIS — E119 Type 2 diabetes mellitus without complications: Secondary | ICD-10-CM | POA: Diagnosis not present

## 2013-12-01 DIAGNOSIS — Z5189 Encounter for other specified aftercare: Secondary | ICD-10-CM | POA: Diagnosis not present

## 2013-12-04 DIAGNOSIS — M6281 Muscle weakness (generalized): Secondary | ICD-10-CM | POA: Diagnosis not present

## 2013-12-04 DIAGNOSIS — J441 Chronic obstructive pulmonary disease with (acute) exacerbation: Secondary | ICD-10-CM | POA: Diagnosis not present

## 2013-12-04 DIAGNOSIS — E119 Type 2 diabetes mellitus without complications: Secondary | ICD-10-CM | POA: Diagnosis not present

## 2013-12-04 DIAGNOSIS — Z5189 Encounter for other specified aftercare: Secondary | ICD-10-CM | POA: Diagnosis not present

## 2013-12-08 DIAGNOSIS — E119 Type 2 diabetes mellitus without complications: Secondary | ICD-10-CM | POA: Diagnosis not present

## 2013-12-08 DIAGNOSIS — Z5189 Encounter for other specified aftercare: Secondary | ICD-10-CM | POA: Diagnosis not present

## 2013-12-08 DIAGNOSIS — J441 Chronic obstructive pulmonary disease with (acute) exacerbation: Secondary | ICD-10-CM | POA: Diagnosis not present

## 2013-12-08 DIAGNOSIS — M6281 Muscle weakness (generalized): Secondary | ICD-10-CM | POA: Diagnosis not present

## 2013-12-09 DIAGNOSIS — E119 Type 2 diabetes mellitus without complications: Secondary | ICD-10-CM | POA: Diagnosis not present

## 2013-12-09 DIAGNOSIS — J441 Chronic obstructive pulmonary disease with (acute) exacerbation: Secondary | ICD-10-CM | POA: Diagnosis not present

## 2013-12-09 DIAGNOSIS — M6281 Muscle weakness (generalized): Secondary | ICD-10-CM | POA: Diagnosis not present

## 2013-12-09 DIAGNOSIS — Z5189 Encounter for other specified aftercare: Secondary | ICD-10-CM | POA: Diagnosis not present

## 2013-12-10 DIAGNOSIS — R0609 Other forms of dyspnea: Secondary | ICD-10-CM | POA: Diagnosis not present

## 2013-12-10 DIAGNOSIS — Z9981 Dependence on supplemental oxygen: Secondary | ICD-10-CM | POA: Diagnosis not present

## 2013-12-10 DIAGNOSIS — R0902 Hypoxemia: Secondary | ICD-10-CM | POA: Diagnosis not present

## 2013-12-10 DIAGNOSIS — J449 Chronic obstructive pulmonary disease, unspecified: Secondary | ICD-10-CM | POA: Diagnosis not present

## 2013-12-10 DIAGNOSIS — R0989 Other specified symptoms and signs involving the circulatory and respiratory systems: Secondary | ICD-10-CM | POA: Diagnosis not present

## 2013-12-12 DIAGNOSIS — N390 Urinary tract infection, site not specified: Secondary | ICD-10-CM | POA: Diagnosis not present

## 2013-12-16 DIAGNOSIS — N390 Urinary tract infection, site not specified: Secondary | ICD-10-CM | POA: Diagnosis not present

## 2014-01-12 DIAGNOSIS — N390 Urinary tract infection, site not specified: Secondary | ICD-10-CM | POA: Diagnosis not present

## 2014-01-23 DIAGNOSIS — R300 Dysuria: Secondary | ICD-10-CM | POA: Diagnosis not present

## 2014-01-23 DIAGNOSIS — R350 Frequency of micturition: Secondary | ICD-10-CM | POA: Diagnosis not present

## 2014-01-23 DIAGNOSIS — R319 Hematuria, unspecified: Secondary | ICD-10-CM | POA: Diagnosis not present

## 2014-02-04 ENCOUNTER — Ambulatory Visit: Payer: Self-pay | Admitting: Obstetrics and Gynecology

## 2014-02-04 DIAGNOSIS — R319 Hematuria, unspecified: Secondary | ICD-10-CM | POA: Diagnosis not present

## 2014-02-04 DIAGNOSIS — J984 Other disorders of lung: Secondary | ICD-10-CM | POA: Diagnosis not present

## 2014-02-04 DIAGNOSIS — D3500 Benign neoplasm of unspecified adrenal gland: Secondary | ICD-10-CM | POA: Diagnosis not present

## 2014-02-04 DIAGNOSIS — R310 Gross hematuria: Secondary | ICD-10-CM | POA: Diagnosis not present

## 2014-02-04 DIAGNOSIS — N200 Calculus of kidney: Secondary | ICD-10-CM | POA: Diagnosis not present

## 2014-02-11 ENCOUNTER — Ambulatory Visit: Payer: Self-pay | Admitting: Specialist

## 2014-02-11 DIAGNOSIS — R918 Other nonspecific abnormal finding of lung field: Secondary | ICD-10-CM | POA: Diagnosis not present

## 2014-02-11 DIAGNOSIS — R911 Solitary pulmonary nodule: Secondary | ICD-10-CM | POA: Diagnosis not present

## 2014-02-12 DIAGNOSIS — J449 Chronic obstructive pulmonary disease, unspecified: Secondary | ICD-10-CM | POA: Diagnosis not present

## 2014-02-12 DIAGNOSIS — R0609 Other forms of dyspnea: Secondary | ICD-10-CM | POA: Diagnosis not present

## 2014-02-12 DIAGNOSIS — R0989 Other specified symptoms and signs involving the circulatory and respiratory systems: Secondary | ICD-10-CM | POA: Diagnosis not present

## 2014-02-12 DIAGNOSIS — R918 Other nonspecific abnormal finding of lung field: Secondary | ICD-10-CM | POA: Diagnosis not present

## 2014-02-12 DIAGNOSIS — R05 Cough: Secondary | ICD-10-CM | POA: Diagnosis not present

## 2014-02-12 DIAGNOSIS — R059 Cough, unspecified: Secondary | ICD-10-CM | POA: Diagnosis not present

## 2014-02-13 DIAGNOSIS — R319 Hematuria, unspecified: Secondary | ICD-10-CM | POA: Diagnosis not present

## 2014-02-13 DIAGNOSIS — R059 Cough, unspecified: Secondary | ICD-10-CM | POA: Diagnosis not present

## 2014-02-13 DIAGNOSIS — R05 Cough: Secondary | ICD-10-CM | POA: Diagnosis not present

## 2014-03-09 DIAGNOSIS — IMO0001 Reserved for inherently not codable concepts without codable children: Secondary | ICD-10-CM | POA: Diagnosis not present

## 2014-03-09 DIAGNOSIS — E039 Hypothyroidism, unspecified: Secondary | ICD-10-CM | POA: Diagnosis not present

## 2014-03-09 DIAGNOSIS — G4700 Insomnia, unspecified: Secondary | ICD-10-CM | POA: Diagnosis not present

## 2014-03-09 DIAGNOSIS — J438 Other emphysema: Secondary | ICD-10-CM | POA: Diagnosis not present

## 2014-04-03 DIAGNOSIS — M329 Systemic lupus erythematosus, unspecified: Secondary | ICD-10-CM | POA: Insufficient documentation

## 2014-04-03 DIAGNOSIS — J449 Chronic obstructive pulmonary disease, unspecified: Secondary | ICD-10-CM | POA: Insufficient documentation

## 2014-04-03 DIAGNOSIS — E049 Nontoxic goiter, unspecified: Secondary | ICD-10-CM | POA: Diagnosis not present

## 2014-04-03 DIAGNOSIS — E039 Hypothyroidism, unspecified: Secondary | ICD-10-CM | POA: Diagnosis not present

## 2014-04-03 DIAGNOSIS — E119 Type 2 diabetes mellitus without complications: Secondary | ICD-10-CM | POA: Diagnosis not present

## 2014-04-06 DIAGNOSIS — E010 Iodine-deficiency related diffuse (endemic) goiter: Secondary | ICD-10-CM | POA: Insufficient documentation

## 2014-04-17 ENCOUNTER — Ambulatory Visit: Payer: Self-pay | Admitting: Family Medicine

## 2014-04-17 DIAGNOSIS — E042 Nontoxic multinodular goiter: Secondary | ICD-10-CM | POA: Diagnosis not present

## 2014-05-12 ENCOUNTER — Ambulatory Visit: Payer: Self-pay | Admitting: Specialist

## 2014-05-12 DIAGNOSIS — J432 Centrilobular emphysema: Secondary | ICD-10-CM | POA: Diagnosis not present

## 2014-05-12 DIAGNOSIS — Z87891 Personal history of nicotine dependence: Secondary | ICD-10-CM | POA: Diagnosis not present

## 2014-05-12 DIAGNOSIS — J439 Emphysema, unspecified: Secondary | ICD-10-CM | POA: Diagnosis not present

## 2014-05-12 DIAGNOSIS — R918 Other nonspecific abnormal finding of lung field: Secondary | ICD-10-CM | POA: Diagnosis not present

## 2014-05-12 DIAGNOSIS — I700 Atherosclerosis of aorta: Secondary | ICD-10-CM | POA: Diagnosis not present

## 2014-05-12 DIAGNOSIS — I2510 Atherosclerotic heart disease of native coronary artery without angina pectoris: Secondary | ICD-10-CM | POA: Diagnosis not present

## 2014-05-14 DIAGNOSIS — J449 Chronic obstructive pulmonary disease, unspecified: Secondary | ICD-10-CM | POA: Diagnosis not present

## 2014-05-14 DIAGNOSIS — R0902 Hypoxemia: Secondary | ICD-10-CM | POA: Diagnosis not present

## 2014-05-14 DIAGNOSIS — R0609 Other forms of dyspnea: Secondary | ICD-10-CM | POA: Diagnosis not present

## 2014-05-14 DIAGNOSIS — R918 Other nonspecific abnormal finding of lung field: Secondary | ICD-10-CM | POA: Diagnosis not present

## 2014-05-15 DIAGNOSIS — R319 Hematuria, unspecified: Secondary | ICD-10-CM | POA: Diagnosis not present

## 2014-05-15 DIAGNOSIS — E1129 Type 2 diabetes mellitus with other diabetic kidney complication: Secondary | ICD-10-CM | POA: Diagnosis not present

## 2014-05-15 DIAGNOSIS — R809 Proteinuria, unspecified: Secondary | ICD-10-CM | POA: Diagnosis not present

## 2014-05-15 DIAGNOSIS — E041 Nontoxic single thyroid nodule: Secondary | ICD-10-CM | POA: Diagnosis not present

## 2014-05-19 ENCOUNTER — Ambulatory Visit: Payer: Self-pay | Admitting: Internal Medicine

## 2014-05-19 DIAGNOSIS — Z1231 Encounter for screening mammogram for malignant neoplasm of breast: Secondary | ICD-10-CM | POA: Diagnosis not present

## 2014-05-19 DIAGNOSIS — J019 Acute sinusitis, unspecified: Secondary | ICD-10-CM | POA: Diagnosis not present

## 2014-05-28 DIAGNOSIS — E041 Nontoxic single thyroid nodule: Secondary | ICD-10-CM | POA: Diagnosis not present

## 2014-06-23 DIAGNOSIS — Z720 Tobacco use: Secondary | ICD-10-CM | POA: Diagnosis not present

## 2014-06-23 DIAGNOSIS — I700 Atherosclerosis of aorta: Secondary | ICD-10-CM | POA: Diagnosis not present

## 2014-06-23 DIAGNOSIS — R490 Dysphonia: Secondary | ICD-10-CM | POA: Diagnosis not present

## 2014-06-23 DIAGNOSIS — J449 Chronic obstructive pulmonary disease, unspecified: Secondary | ICD-10-CM | POA: Diagnosis not present

## 2014-06-23 DIAGNOSIS — E1165 Type 2 diabetes mellitus with hyperglycemia: Secondary | ICD-10-CM | POA: Diagnosis not present

## 2014-06-23 DIAGNOSIS — E042 Nontoxic multinodular goiter: Secondary | ICD-10-CM | POA: Diagnosis not present

## 2014-08-11 DIAGNOSIS — E1129 Type 2 diabetes mellitus with other diabetic kidney complication: Secondary | ICD-10-CM | POA: Diagnosis not present

## 2014-08-11 DIAGNOSIS — R809 Proteinuria, unspecified: Secondary | ICD-10-CM | POA: Diagnosis not present

## 2014-08-11 DIAGNOSIS — E041 Nontoxic single thyroid nodule: Secondary | ICD-10-CM | POA: Diagnosis not present

## 2014-08-14 DIAGNOSIS — K277 Chronic peptic ulcer, site unspecified, without hemorrhage or perforation: Secondary | ICD-10-CM | POA: Diagnosis not present

## 2014-08-14 DIAGNOSIS — I479 Paroxysmal tachycardia, unspecified: Secondary | ICD-10-CM | POA: Diagnosis not present

## 2014-08-14 DIAGNOSIS — Z0000 Encounter for general adult medical examination without abnormal findings: Secondary | ICD-10-CM | POA: Diagnosis not present

## 2014-08-14 DIAGNOSIS — J449 Chronic obstructive pulmonary disease, unspecified: Secondary | ICD-10-CM | POA: Diagnosis not present

## 2014-08-14 DIAGNOSIS — M329 Systemic lupus erythematosus, unspecified: Secondary | ICD-10-CM | POA: Diagnosis not present

## 2014-08-14 DIAGNOSIS — E1165 Type 2 diabetes mellitus with hyperglycemia: Secondary | ICD-10-CM | POA: Diagnosis not present

## 2014-08-14 DIAGNOSIS — E039 Hypothyroidism, unspecified: Secondary | ICD-10-CM | POA: Diagnosis not present

## 2014-08-14 DIAGNOSIS — F5101 Primary insomnia: Secondary | ICD-10-CM | POA: Diagnosis not present

## 2014-08-14 DIAGNOSIS — I700 Atherosclerosis of aorta: Secondary | ICD-10-CM | POA: Diagnosis not present

## 2014-08-18 DIAGNOSIS — R809 Proteinuria, unspecified: Secondary | ICD-10-CM | POA: Diagnosis not present

## 2014-08-18 DIAGNOSIS — E039 Hypothyroidism, unspecified: Secondary | ICD-10-CM | POA: Insufficient documentation

## 2014-08-18 DIAGNOSIS — E119 Type 2 diabetes mellitus without complications: Secondary | ICD-10-CM | POA: Insufficient documentation

## 2014-08-18 DIAGNOSIS — E042 Nontoxic multinodular goiter: Secondary | ICD-10-CM | POA: Diagnosis not present

## 2014-08-18 DIAGNOSIS — R5381 Other malaise: Secondary | ICD-10-CM | POA: Diagnosis not present

## 2014-08-18 DIAGNOSIS — E1129 Type 2 diabetes mellitus with other diabetic kidney complication: Secondary | ICD-10-CM | POA: Diagnosis not present

## 2014-09-25 ENCOUNTER — Other Ambulatory Visit: Payer: Self-pay | Admitting: Specialist

## 2014-09-25 DIAGNOSIS — R69 Illness, unspecified: Secondary | ICD-10-CM

## 2014-09-25 DIAGNOSIS — R918 Other nonspecific abnormal finding of lung field: Secondary | ICD-10-CM

## 2014-10-03 NOTE — Discharge Summary (Signed)
PATIENT NAME:  [name], [name] MR#:  [id number] DATE OF BIRTH:  1935/09/21  DATE OF ADMISSION:  10/19/2013 DATE OF DISCHARGE:  10/25/2013  PRESENTING COMPLAINT: Shortness of breath.   DISCHARGE DIAGNOSES: 1. Acute on chronic respiratory failure due to chronic obstructive pulmonary disease flare.  2. Pneumonia.  3. Type 2 diabetes.   CODE STATUS: Full code.   MEDICATIONS: 1. Spiriva 18 mcg inhalation daily.  2. Ventolin 90 mcg/inhalations 2 puffs 4 times a day.  3. Lorazepam 1 mg 1/2 tablet at bedtime.  4. Januvia  100 mg daily.  5. Dulera 2 puffs b.i.d.  6. Levothyroxine 25 mcg p.o. daily.  7. Acetaminophen/oxycodone 5/325 1 tablet q.6 as needed.  8. Guaifenesin 600 mg p.o. t.i.d.  9. Ranitidine 150 mg b.i.d.  10. Carafate 1 gram daily, 3 times a day with meals.  11. Levaquin one every 48 hours.   Home health physical therapy.   DIET: Carbohydrate -controlled diet.   FOLLOWUP:  1. With Dr. Halina Maidens.  2. Use your oxygen as before.  3. Follow with Dr. Raul Del on your appointment next week.   BRIEF SUMMARY OF HOSPITAL COURSE: [name] is a 79 year old Caucasian female with past medical history of chronic obstructive pulmonary disease, ex-tobacco abuse, history of hypertension, diabetes, comes in with increasing shortness of breath, was admitted with:  1. SIRS secondary to acute bronchitis/right upper lobe pneumonia. The patient was started on IV antibiotics with Levaquin and chest x-ray repeat showed improvement. White count was stable and blood cultures were negative.  2. Abdominal pain, suspected due to gastritis induced from prednisone. Carafate seemed to help, continued PPIs.  3. Chronic obstructive pulmonary disease exacerbation with right upper lobe pneumonia. The patient was given a high dose of course of steroids and will finish up the p.o. taper of steroids at home and continue her inhalers and nebulizer.  4. Generalized weakness. Home health PT was  recommended.  5. Type 2 diabetes. Continue Januvia and sliding scale.  6. Anxiety. Continue Ativan p.r.n.  7. Hypothyroidism, on Synthroid.   Hospital stay otherwise remained stable.   CODE STATUS: The patient remained a full code.   TIME SPENT: 40 minutes.   ____________________________ Hart Rochester [name] Pronto, MD sap:sg D: 10/26/2013 07:05:51 ET T: 10/26/2013 09:29:37 ET JOB#: [id number]  cc: Jadore Veals A. [name] Pronto, MD, <Dictator> Ilda Basset MD ELECTRONICALLY SIGNED 11/02/2013 16:18

## 2014-10-03 NOTE — H&P (Signed)
PATIENT NAME:  [name], [name] MR#:  [id number] DATE OF BIRTH:  [birth year]  DATE OF ADMISSION:  07/16/2013  PRIMARY CARE PHYSICIAN: Halina Maidens, MD  CHIEF COMPLAINT: Shortness of breath.   HISTORY OF PRESENT ILLNESS: This is a 79 year old female who presents to the hospital with shortness of breath that has progressively gotten worse over the past week. The patient says that her husband had a flulike illness, although not definitely diagnosed with the flu. She also developed similar symptoms of cough, productive sputum, chills, body aches over the past week. She though it would improve, but it has not. She has developed significant exertional dyspnea and therefore came to the ER for further evaluation. The patient was noted to be hypoxic with O2 sats in the 80s on arrival. She was given multiple nebulizer treatments, and her O2 sats did improve when she was placed on 4 liters nasal cannula. Her chest x-ray was consistent with a possible pneumonia. Hospitalist services were contacted for further treatment and evaluation. The patient does admit to a cough, which is productive with brown sputum, sometimes with blood specks. Positive chills. No documented fever. Positive nausea. No vomiting. No diarrhea. No abdominal pain. No other associated symptoms presently.   REVIEW OF SYSTEMS: CONSTITUTIONAL: No documented fever. No weight gain. No weight loss.  EYES: No blurred or double vision.  EARS, NOSE, THROAT: No tinnitus. No postnasal drip. No redness of the oropharynx.  RESPIRATORY: Positive cough. No wheeze. No hemoptysis. Positive dyspnea.  CARDIOVASCULAR: No chest pain, no orthopnea, no palpitations, no syncope.  GASTROINTESTINAL: No nausea, no vomiting, no diarrhea. No abdominal pain. No melena or hematochezia.  GENITOURINARY: No dysuria or hematuria.  ENDOCRINE: No polyuria, nocturia, heat or cold intolerance.  HEMATOLOGIC: No anemia. No bruising. No bleeding.  INTEGUMENTARY: No rashes. No  lesions.  MUSCULOSKELETAL: No arthritis. No swelling. No gout.  NEUROLOGIC: No numbness or tingling. No ataxia. No seizure-type activity.  PSYCHIATRIC: No anxiety. No insomnia. No ADD.   PAST MEDICAL HISTORY: Consistent with COPD, hypothyroidism, diabetes, anxiety.   SOCIAL HISTORY: Used to be a smoker, quit this past May. Does have a 40 pack-year smoking history. No alcohol abuse. No illicit drug abuse. Lives at home with her husband.   FAMILY HISTORY: Both mother and father are deceased. Father died from an MI at age 82. Mother died from an MI at age 69. Mother was also diabetic.   CURRENT MEDICATIONS: As follows: Advair 250/50 one puff b.i.d., Dulera 2 puffs b.i.d., Januvia 100 mg daily, Synthroid 25 mcg daily, lorazepam 0.5 mg at bedtime, Spiriva one puff daily, albuterol inhaler as needed.   PHYSICAL EXAMINATION ON ADMISSION: As follows:  VITAL SIGNS: Temperature 98, pulse 98, respirations 20, blood pressure 151/59, sats 98% on 4 liters nasal cannula.  GENERAL: She is a pleasant-appearing female in mild respiratory distress.  HEAD, EYES, EARS, NOSE, THROAT: She is atraumatic, normocephalic. Extraocular muscles are intact. Pupils equal and reactive to light. Sclerae anicteric. No conjunctival injection. No pharyngeal erythema.  NECK: Supple. There is no jugular venous distention. No bruits, no lymphadenopathy, no thyromegaly.  HEART: Regular rate and rhythm. No murmurs. No rubs. No clicks.  LUNGS: She has prolonged inspiratory and expiratory phase and expiratory wheezing diffusely. Negative use of accessory muscles. No dullness to percussion.  ABDOMEN: Soft, flat, nontender, nondistended. Has good bowel sounds. No hepatosplenomegaly appreciated.  EXTREMITIES: No evidence of any cyanosis, clubbing, or peripheral edema. Has +2 pedal and radial pulses bilaterally.  NEUROLOGICAL: The patient is alert,  awake, oriented x 3, with no focal motor or sensory deficits appreciated bilaterally.  SKIN:  Moist and warm with no rashes appreciated.  LYMPHATIC: There is no cervical or axillary lymphadenopathy.   LABORATORY AND RADIOLOGICAL DATA: Serum glucose of 315, BUN 15, creatinine 0.7, sodium 132, potassium 3.9, chloride 96, bicarbonate 37. LFTs are within normal limits. Troponin is less than 0.02. White cell count 8, hemoglobin 12.2, hematocrit 36.6, platelet count 142. The patient did have a chest x-ray done, which showed patchy airspace disease in the right lung worrisome for pneumonia.   ASSESSMENT AND PLAN: This is a 79 year old female with a history of chronic obstructive pulmonary disease, hypothyroidism, diabetes, anxiety, who presented to the hospital due to shortness of breath progressively getting worse over the past week and noted to have chronic obstructive pulmonary disease exacerbation.  1.  Chronic obstructive pulmonary disease exacerbation: This is likely the cause of the patient's shortness of breath, likely related to underlying bronchitis/pneumonia, although she has not been ruled out for the flu; therefore, I will get a nasal swab. I will start the patient on IV steroids, around-the-clock nebulizer treatments. Continue Advair, Spiriva. Add Pulmicort nebulizers and also continue empiric Levaquin. Follow blood and sputum cultures. The patient already is on home oxygen.  2.  Diabetes: Continue her Januvia. Will add some sliding scale insulin, as the patient is going to be on IV steroids.  3.  Anxiety: Continue Ativan.  4.  Hypothyroidism: Continue with her Synthroid.   CODE STATUS: The patient is a full code.   TIME SPENT: 50 minutes.    ____________________________ Belia Heman. Verdell Carmine, MD vjs:jcm D: 07/16/2013 15:04:44 ET T: 07/16/2013 15:47:07 ET JOB#: [id number]  cc: Belia Heman. Verdell Carmine, MD, <Dictator> Henreitta Leber MD ELECTRONICALLY SIGNED 07/17/2013 11:53

## 2014-10-03 NOTE — H&P (Signed)
PATIENT NAME:  [name], [name] MR#:  [id number] DATE OF BIRTH:  07/05/1935  DATE OF ADMISSION:  10/19/2013  PRIMARY CARE PHYSICIAN: Halina Maidens, MD   PRIMARY PULMONOLOGIST: Dr. Raul Del.   CHIEF COMPLAINT: Shortness of breath, wheezing.   HISTORY OF PRESENT ILLNESS: This is a 79 year old female with history of COPD who uses oxygen p.r.n., who presents since the past 4 to 5 days of increasing shortness of breath, cough with gray thick sputum. No fevers or chills are noted. In the ER, she received IV steroids as she is unable to take high-dose steroids, so she was given 40 IV steroids and continues to have some tightness in her chest.  REVIEW OF SYSTEMS:  CONSTITUTIONAL: No fever. Positive chills. Positive fatigue and weakness.   EYES: No blurred or double vision.   ENT: No ear pain, hearing loss. Positive seasonal allergies. No snoring. RESPIRATORY: Positive cough, positive wheezing. Positive COPD.  No hemoptysis.  CARDIOVASCULAR: No chest pain, orthopnea, edema, arrhythmia, dyspnea on exertion, palpitations. GASTROINTESTINAL: No nausea or vomiting, diarrhea, abdominal pain, melena or ulcers. GENITOURINARY: No dysuria or hematuria. ENDOCRINE: No polyuria or polydipsia.   HEME/LYMPH: No anemia or easy bruising. SKIN: No rash or lesions. MUSCULOSKELETAL: No pain in the shoulders or knees.   NEUROLOGIC: No history of CVA, TIA, or seizures. PSYCHIATRIC: Positive anxiety.   PAST MEDICAL HISTORY: 1. Diabetes.  2. Lupus. 3. Chronic obstructive pulmonary disease.  4. Kidney stones. 5. Anxiety.   PAST SURGICAL HISTORY: Hysterectomy.   ALLERGIES: PENICILLIN, ERYTHROMYCIN, CODEINE AND SULFA.   SOCIAL HISTORY: She quit smoking a year ago; was former heavy smoker. No alcohol or IV drug use.   FAMILY HISTORY: Positive for hypertension and diabetes.   MEDICATIONS: 1. Ventolin HFA 2 puffs 4 times a day.  2. Spiriva 18 mg daily.  3. Ranitidine 150 mg b.i.d.  4. Ativan 0.5 mg daily.   5. Synthroid 25 mcg daily.  6. Januvia 100 mg daily.  7. Guaifenesin 600 mg p.o. t.i.d.  8. Dulera 2 puffs b.i.d.  9. Acetaminophen 1 tablet q.6 hours.   PHYSICAL EXAMINATION: VITAL SIGNS: Temperature 98.7, pulse 109, respirations 26, blood pressure 141/57, and 94% on room air.  GENERAL: The patient is alert, oriented, tachypneic, in moderate distress.  HEENT: Head is atraumatic. Pupils are round and reactive, sclerae anicteric. Mucous membranes are moist. Oropharynx is clear.  NECK: Supple without JVD, carotid bruits or enlarged thyroid.  CARDIOVASCULAR: Regular rate and rhythm. No murmurs, gallops or rubs. PMI is not displaced.  LUNGS: She has decreased breath sounds throughout. Very tight; some very fair air movement and some very mild wheezing. No crackles or rales are heard.  BACK: No CVA or vertebral tenderness.  ABDOMEN: Bowel sounds present. Nontender, nondistended. No hepatosplenomegaly.  EXTREMITIES: No clubbing, cyanosis, or edema. NEUROLOGIC: Cranial nerves II through XII are intact. No focal deficits.   LABORATORIES: White blood cells 7.8, hemoglobin 12, hematocrit 37, platelets 149,000. Sodium 138, potassium 4.3, chloride 100, bicarb 35, BUN 11, creatinine 0.69, glucose is 279. Troponins less than 0.02. Chest x-ray shows resolving right upper lobe pneumonia. EKG is sinus tachycardia, heart rate 105 without ST elevation or depression.   ASSESSMENT AND PLAN: A 79 year old female with a history of chronic obstructive pulmonary disease who presents with acute chronic obstructive pulmonary disease exacerbation.  1. Systemic inflammatory response syndrome with tachycardia and tachypnea from chronic obstructive pulmonary disease exacerbation as outlined below.  2. Chronic obstructive pulmonary disease exacerbation probably secondary bronchitis. She has a chest  x-ray which shows resolving right upper lobe pneumonia. She has congestion and thick gray sputum. For her chronic obstructive  pulmonary disease exacerbation, I placed her on IV steroids, Levaquin antibiotic, DuoNebs, continued her outpatient inhalers, and will wean O2 as tolerated.  3. Hypothyroidism. Continue Synthroid.  4. Diabetes. The patient is on sliding scale insulin and will continue her Januvia and ADA diet.  5. Anxiety. Will continue Ativan.  6. The patient is FULL CODE Status.    TIME SPENT: Approximately 45 minutes.      ____________________________ Donell Beers. Benjie Karvonen, MD spm:lm D: 10/19/2013 16:55:18 ET T: 10/19/2013 20:12:18 ET JOB#: [id number]  cc: Rumor Sun P. Benjie Karvonen, MD, <Dictator> Donell Beers Avayah Raffety MD ELECTRONICALLY SIGNED 10/24/2013 18:14

## 2014-10-03 NOTE — Discharge Summary (Signed)
PATIENT NAME:  [name], [name] MR#:  [id number] DATE OF BIRTH:  [birth year]  DATE OF ADMISSION:  07/16/2013 DATE OF DISCHARGE:  07/20/2013  DISCHARGE DIAGNOSES: 1.  Chronic obstructive pulmonary disease exacerbation, chronic respiratory failure, 2 liters of oxygen use at home.  2.  Pneumonia.  3.  Anxiety.  4.  Gastritis due to steroid and antibiotic.   CONDITION ON DISCHARGE: Stable.   CODE STATUS: FULL code.   MEDICATIONS ON DISCHARGE: 1.  Spiriva 18 mcg inhalation once a day.  2.  Ventolin 2 puff inhalation 4 times a day.  3.  Lorazepam 1 mg oral tablet take 1/2 tablet once a day.  4.  Januvia 100 mg oral tablet once a day.  5.  Dulera 2 puff inhalation 2 times a day.  6.  Levothyroxine 25 once a day.  7.  Advair Diskus 1 puff inhalation 2 times a day.  8.  Prednisone 10 mg oral tablet start at 60 mg and taper x 10 mg daily until complete.  9.  Acetaminophen/oxycodone tablet every 6 hours as needed for pain.  10.  Guaifenesin 600 mg oral tablet extended-release 3 times a day.  11.  Ranitidine 150 mg oral tablet every 12 hours.  12.  Sucralfate 1 gram oral 4 times a day.  13.  Pantoprazole 40 mg oral delayed-release tablet once a day.  14.  Levofloxacin 750 mg oral tablet every 48 hours for 2 days. 15.  Pulmicort 1 puff inhalation 2 times a day.   HOME OXYGEN: Advised to have oxygen 2 liters via nasal cannula on discharge.   DISCHARGE DIET: Diet consistency: Regular. Diet regular.   DISCHARGE FOLLOWUP: Follow with GI clinic in 1 to 2 weeks if gastritis persists after stopping steroid and follow with pulmonologist in office in 1 month.  HISTORY OF PRESENT ILLNESS: A 79 year old female who presented to hospital with shortness of breath, but was progressively getting worse over the past 1 week. Her husband had flulike illness, not definitely diagnosed with flu. She also developed some symptoms of cough, which was productive of sputum , chills and body ache over the last 1 week.  Came to ER for further evaluation and noted to be hypoxic with oxygen saturation in 80s on arrival. Was given nebulizer treatment. Her oxygen saturation improved. Was placed on 4 liters nasal cannula. Chest x-ray was consistent with possible pneumonia and so hospitalist service was contacted for further management.   HOSPITAL COURSE AND STAY: For COPD exacerbation and pneumonia, she was initially started on 4 liters oxygen and gradually cutting down oxygen level to 2 liters, which was her baseline at home. We treated with Levaquin, gave her IV steroid, nebulizer treatments, Spiriva, and Pulmicort. She had severe gastritis reaction to the IV steroid and so we switched it to oral and started on sucralfate, Maalox, and PPI for her gastritis and also reassured that if after a week her gastritis does not resolve then she needs to see a gastroenterologist as she has extensive history of GI issues in the past.   OTHER MEDICAL ISSUES: 1.  Diabetes. We continued home medication, Januvia, and kept her on sliding scale coverage.  2.  Anxiety. We continued Spiriva on as needed basis.  3.  Hypothyroidism. We continued Synthroid on as needed basis.   IMPORTANT LABORATORY AND DIAGNOSTIC RESULTS IN THE HOSPITAL: BNP on arrival was 219. WBC was 8000 and hemoglobin was 12.2 on admission. BUN was 15, creatinine 0.77, sodium 132, and potassium 3.9. Troponin remained  negative, less than 0.02. Blood cultures remained negative.   Chest x-ray, PA and lateral, on admission: Patchy airspace disease in right lung, worrisome for pneumonia.   Urinalysis was positive with 22 WBC and 3+ leukocyte esterase. Blood culture was negative. Influenza A and B were negative.   TOTAL TIME SPENT ON THIS DISCHARGE: 40 minutes.  ____________________________ Ceasar Lund Anselm Jungling, MD vgv:sb D: 07/24/2013 23:54:47 ET T: 07/25/2013 07:32:01 ET JOB#: [id number]  cc: Ceasar Lund. Anselm Jungling, MD, <Dictator> Halina Maidens, MD Vaughan Basta MD ELECTRONICALLY SIGNED 08/02/2013 8:39

## 2014-10-05 DIAGNOSIS — H2513 Age-related nuclear cataract, bilateral: Secondary | ICD-10-CM | POA: Diagnosis not present

## 2014-10-20 DIAGNOSIS — K277 Chronic peptic ulcer, site unspecified, without hemorrhage or perforation: Secondary | ICD-10-CM | POA: Diagnosis not present

## 2014-10-20 DIAGNOSIS — R490 Dysphonia: Secondary | ICD-10-CM | POA: Diagnosis not present

## 2014-10-20 DIAGNOSIS — J449 Chronic obstructive pulmonary disease, unspecified: Secondary | ICD-10-CM | POA: Diagnosis not present

## 2014-10-29 DIAGNOSIS — B3781 Candidal esophagitis: Secondary | ICD-10-CM | POA: Diagnosis not present

## 2014-10-29 DIAGNOSIS — R1310 Dysphagia, unspecified: Secondary | ICD-10-CM | POA: Diagnosis not present

## 2014-10-29 DIAGNOSIS — K219 Gastro-esophageal reflux disease without esophagitis: Secondary | ICD-10-CM | POA: Diagnosis not present

## 2014-11-12 ENCOUNTER — Ambulatory Visit
Admission: RE | Admit: 2014-11-12 | Discharge: 2014-11-12 | Disposition: A | Discharge status: Home / Self Care | Payer: Medicare Other | Source: Ambulatory Visit | Attending: Specialist | Admitting: Specialist

## 2014-11-12 DIAGNOSIS — R0602 Shortness of breath: Secondary | ICD-10-CM | POA: Diagnosis not present

## 2014-11-12 DIAGNOSIS — R918 Other nonspecific abnormal finding of lung field: Secondary | ICD-10-CM | POA: Diagnosis not present

## 2014-11-12 DIAGNOSIS — J439 Emphysema, unspecified: Secondary | ICD-10-CM | POA: Diagnosis not present

## 2014-11-12 DIAGNOSIS — J984 Other disorders of lung: Secondary | ICD-10-CM | POA: Diagnosis not present

## 2014-11-17 ENCOUNTER — Telehealth: Payer: Self-pay | Admitting: Urgent Care

## 2014-11-17 NOTE — Telephone Encounter (Signed)
Patient needs an EGD with Dr. Allen Norris at [facility] re: dysphagia/GERD like  discussed at last [place]. Thanks

## 2014-11-18 NOTE — Telephone Encounter (Signed)
Called patient back to let her know that [name] suggested for her to have an EGD. Patient agreed on doing. Therefore, we scheduled it at [facility] on 12/11/2014. I told patient that I will send her instructions by mail.

## 2014-11-25 ENCOUNTER — Other Ambulatory Visit: Payer: Self-pay

## 2014-11-25 ENCOUNTER — Telehealth: Payer: Self-pay

## 2014-11-26 NOTE — Telephone Encounter (Signed)
Pt scheduled for an EGD at [facility] on 12-11-14. Instructs mailed to pt.

## 2014-12-01 DIAGNOSIS — R5381 Other malaise: Secondary | ICD-10-CM | POA: Diagnosis not present

## 2014-12-01 DIAGNOSIS — E041 Nontoxic single thyroid nodule: Secondary | ICD-10-CM | POA: Diagnosis not present

## 2014-12-01 DIAGNOSIS — E042 Nontoxic multinodular goiter: Secondary | ICD-10-CM | POA: Diagnosis not present

## 2014-12-02 DIAGNOSIS — R5381 Other malaise: Secondary | ICD-10-CM | POA: Diagnosis not present

## 2014-12-02 DIAGNOSIS — E042 Nontoxic multinodular goiter: Secondary | ICD-10-CM | POA: Diagnosis not present

## 2014-12-09 ENCOUNTER — Telehealth: Payer: Self-pay | Admitting: Urgent Care

## 2014-12-09 ENCOUNTER — Encounter: Payer: Self-pay | Admitting: *Deleted

## 2014-12-09 NOTE — Telephone Encounter (Signed)
Returned call to patient. She states that Dexilant has not been helping so she went back to the Ranitidine after 30 day prescription ran out. She has tried omeprazole, pantoprazole, and nexium with no relief in the past.  Encouraged patient to continue ranitidine use until EGD was complete and then Dr. Allen Norris could make recommendations depending on what procedure shows.

## 2014-12-09 NOTE — Telephone Encounter (Signed)
Pt has called to see if she needs to continue Dexilant prior to her EGD. Please contact pt back @ [phone]. If patient is not available please leave a VM.

## 2014-12-09 NOTE — Telephone Encounter (Signed)
agree

## 2014-12-09 NOTE — Anesthesia Preprocedure Evaluation (Addendum)
Anesthesia Evaluation  Patient identified by MRN, date of birth, ID band  Reviewed: Allergy & Precautions, H&P , NPO status , Patient's Chart, lab work & pertinent test results  History of Anesthesia Complications (+) PONV and history of anesthetic complications  Airway Mallampati: II  TM Distance: >3 FB Neck ROM: full    Dental no notable dental hx.    Pulmonary shortness of breath, asthma , COPD oxygen dependent, former smoker,    Pulmonary exam normal       Cardiovascular Rhythm:regular Rate:Normal  Cardiology note regarding palpitations: "This seems to be due to sinus tachycardia in the setting of anxiety. Symptoms overall improved. Outpatient telemetry showed no significant arrhythmia. Continue observation and followup as needed for worsening symptoms."   Neuro/Psych    GI/Hepatic GERD-  ,  Endo/Other  diabetesHypothyroidism   Renal/GU      Musculoskeletal   Abdominal   Peds  Hematology   Anesthesia Other Findings   Reproductive/Obstetrics                            Anesthesia Physical Anesthesia Plan  ASA: IV  Anesthesia Plan: MAC   Post-op Pain Management:    Induction:   Airway Management Planned:   Additional Equipment:   Intra-op Plan:   Post-operative Plan:   Informed Consent: I have reviewed the patients History and Physical, chart, labs and discussed the procedure including the risks, benefits and alternatives for the proposed anesthesia with the patient or authorized representative who has indicated his/her understanding and acceptance.     Plan Discussed with: CRNA  Anesthesia Plan Comments:         Anesthesia Quick Evaluation

## 2014-12-10 NOTE — Discharge Instructions (Signed)

## 2014-12-11 ENCOUNTER — Ambulatory Visit: Payer: Medicare Other | Admitting: Anesthesiology

## 2014-12-11 ENCOUNTER — Other Ambulatory Visit: Payer: Self-pay | Admitting: Gastroenterology

## 2014-12-11 ENCOUNTER — Encounter: Payer: Self-pay | Admitting: *Deleted

## 2014-12-11 ENCOUNTER — Encounter
Admission: RE | Disposition: A | Discharge status: Home / Self Care | Payer: Self-pay | Source: Ambulatory Visit | Attending: Gastroenterology

## 2014-12-11 ENCOUNTER — Ambulatory Visit
Admission: RE | Admit: 2014-12-11 | Discharge: 2014-12-11 | Disposition: A | Discharge status: Home / Self Care | Payer: Medicare Other | Source: Ambulatory Visit | Attending: Gastroenterology | Admitting: Gastroenterology

## 2014-12-11 DIAGNOSIS — Z87891 Personal history of nicotine dependence: Secondary | ICD-10-CM | POA: Diagnosis not present

## 2014-12-11 DIAGNOSIS — Z79899 Other long term (current) drug therapy: Secondary | ICD-10-CM | POA: Insufficient documentation

## 2014-12-11 DIAGNOSIS — K589 Irritable bowel syndrome without diarrhea: Secondary | ICD-10-CM | POA: Diagnosis not present

## 2014-12-11 DIAGNOSIS — Z8349 Family history of other endocrine, nutritional and metabolic diseases: Secondary | ICD-10-CM | POA: Insufficient documentation

## 2014-12-11 DIAGNOSIS — E119 Type 2 diabetes mellitus without complications: Secondary | ICD-10-CM | POA: Diagnosis not present

## 2014-12-11 DIAGNOSIS — Z87442 Personal history of urinary calculi: Secondary | ICD-10-CM | POA: Insufficient documentation

## 2014-12-11 DIAGNOSIS — G4700 Insomnia, unspecified: Secondary | ICD-10-CM | POA: Insufficient documentation

## 2014-12-11 DIAGNOSIS — Z8042 Family history of malignant neoplasm of prostate: Secondary | ICD-10-CM | POA: Diagnosis not present

## 2014-12-11 DIAGNOSIS — M329 Systemic lupus erythematosus, unspecified: Secondary | ICD-10-CM | POA: Insufficient documentation

## 2014-12-11 DIAGNOSIS — Z8052 Family history of malignant neoplasm of bladder: Secondary | ICD-10-CM | POA: Insufficient documentation

## 2014-12-11 DIAGNOSIS — F419 Anxiety disorder, unspecified: Secondary | ICD-10-CM | POA: Insufficient documentation

## 2014-12-11 DIAGNOSIS — Z91040 Latex allergy status: Secondary | ICD-10-CM | POA: Diagnosis not present

## 2014-12-11 DIAGNOSIS — K277 Chronic peptic ulcer, site unspecified, without hemorrhage or perforation: Secondary | ICD-10-CM | POA: Insufficient documentation

## 2014-12-11 DIAGNOSIS — Z882 Allergy status to sulfonamides status: Secondary | ICD-10-CM | POA: Diagnosis not present

## 2014-12-11 DIAGNOSIS — R002 Palpitations: Secondary | ICD-10-CM | POA: Diagnosis not present

## 2014-12-11 DIAGNOSIS — Z881 Allergy status to other antibiotic agents status: Secondary | ICD-10-CM | POA: Insufficient documentation

## 2014-12-11 DIAGNOSIS — Z880 Allergy status to penicillin: Secondary | ICD-10-CM | POA: Insufficient documentation

## 2014-12-11 DIAGNOSIS — Z7951 Long term (current) use of inhaled steroids: Secondary | ICD-10-CM | POA: Diagnosis not present

## 2014-12-11 DIAGNOSIS — I479 Paroxysmal tachycardia, unspecified: Secondary | ICD-10-CM | POA: Insufficient documentation

## 2014-12-11 DIAGNOSIS — M47896 Other spondylosis, lumbar region: Secondary | ICD-10-CM | POA: Insufficient documentation

## 2014-12-11 DIAGNOSIS — Z8249 Family history of ischemic heart disease and other diseases of the circulatory system: Secondary | ICD-10-CM | POA: Insufficient documentation

## 2014-12-11 DIAGNOSIS — Z8041 Family history of malignant neoplasm of ovary: Secondary | ICD-10-CM | POA: Insufficient documentation

## 2014-12-11 DIAGNOSIS — Z833 Family history of diabetes mellitus: Secondary | ICD-10-CM | POA: Insufficient documentation

## 2014-12-11 DIAGNOSIS — B3781 Candidal esophagitis: Secondary | ICD-10-CM | POA: Diagnosis not present

## 2014-12-11 DIAGNOSIS — I341 Nonrheumatic mitral (valve) prolapse: Secondary | ICD-10-CM | POA: Diagnosis not present

## 2014-12-11 DIAGNOSIS — Z90710 Acquired absence of both cervix and uterus: Secondary | ICD-10-CM | POA: Insufficient documentation

## 2014-12-11 DIAGNOSIS — J449 Chronic obstructive pulmonary disease, unspecified: Secondary | ICD-10-CM | POA: Diagnosis not present

## 2014-12-11 DIAGNOSIS — R1310 Dysphagia, unspecified: Secondary | ICD-10-CM | POA: Diagnosis not present

## 2014-12-11 DIAGNOSIS — K219 Gastro-esophageal reflux disease without esophagitis: Secondary | ICD-10-CM | POA: Insufficient documentation

## 2014-12-11 DIAGNOSIS — I7300 Raynaud's syndrome without gangrene: Secondary | ICD-10-CM | POA: Insufficient documentation

## 2014-12-11 DIAGNOSIS — E039 Hypothyroidism, unspecified: Secondary | ICD-10-CM | POA: Insufficient documentation

## 2014-12-11 DIAGNOSIS — B379 Candidiasis, unspecified: Secondary | ICD-10-CM | POA: Diagnosis not present

## 2014-12-11 HISTORY — DX: Gastro-esophageal reflux disease without esophagitis: K21.9

## 2014-12-11 HISTORY — PX: ESOPHAGOGASTRODUODENOSCOPY (EGD) WITH PROPOFOL: SHX5813

## 2014-12-11 HISTORY — DX: Presence of dental prosthetic device (complete) (partial): Z97.2

## 2014-12-11 HISTORY — DX: Presence of spectacles and contact lenses: Z97.3

## 2014-12-11 HISTORY — DX: Motion sickness, initial encounter: T75.3XXA

## 2014-12-11 LAB — GLUCOSE, CAPILLARY: Glucose-Capillary: 161 mg/dL — ABNORMAL HIGH (ref 65–99)

## 2014-12-11 SURGERY — ESOPHAGOGASTRODUODENOSCOPY (EGD) WITH PROPOFOL
Anesthesia: Monitor Anesthesia Care | Wound class: Clean Contaminated

## 2014-12-11 MED ORDER — ONDANSETRON HCL 4 MG/2ML IJ SOLN
INTRAMUSCULAR | Status: DC | PRN
  Administered 2014-12-11: 4 mg via INTRAVENOUS

## 2014-12-11 MED ORDER — LIDOCAINE HCL (CARDIAC) 20 MG/ML IV SOLN
INTRAVENOUS | Status: DC | PRN
  Administered 2014-12-11: 40 mg via INTRAVENOUS

## 2014-12-11 MED ORDER — ACETAMINOPHEN 325 MG PO TABS: 325.0000 mg | ORAL_TABLET | ORAL | Status: DC | PRN

## 2014-12-11 MED ORDER — PROPOFOL 10 MG/ML IV BOLUS
INTRAVENOUS | Status: DC | PRN
  Administered 2014-12-11: 50 mg via INTRAVENOUS
  Administered 2014-12-11 (×2): 20 mg via INTRAVENOUS

## 2014-12-11 MED ORDER — ACETAMINOPHEN 160 MG/5ML PO SOLN
325.0000 mg | ORAL | Status: DC | PRN
Start: 2014-12-11 — End: 2014-12-11

## 2014-12-11 MED ORDER — STERILE WATER FOR IRRIGATION IR SOLN
Status: DC | PRN
Start: 2014-12-11 — End: 2014-12-11
  Administered 2014-12-11: 08:00:00

## 2014-12-11 MED ORDER — GLYCOPYRROLATE 0.2 MG/ML IJ SOLN
INTRAMUSCULAR | Status: DC | PRN
  Administered 2014-12-11: 0.2 mg via INTRAVENOUS

## 2014-12-11 MED ORDER — FLUCONAZOLE 100 MG PO TABS: 100.0000 mg | ORAL_TABLET | Freq: Every day | ORAL | Status: DC

## 2014-12-11 MED ORDER — LACTATED RINGERS IV SOLN
INTRAVENOUS | Status: DC
  Administered 2014-12-11: 08:00:00 via INTRAVENOUS

## 2014-12-11 SURGICAL SUPPLY — 39 items
BALLN DILATOR 10-12 8 (BALLOONS)
BALLN DILATOR 12-15 8 (BALLOONS)
BALLN DILATOR 15-18 8 (BALLOONS)
BALLN DILATOR CRE 0-12 8 (BALLOONS)
BALLN DILATOR ESOPH 8 10 CRE (MISCELLANEOUS) IMPLANT
BALLOON DILATOR 12-15 8 (BALLOONS) IMPLANT
BALLOON DILATOR 15-18 8 (BALLOONS) IMPLANT
BALLOON DILATOR CRE 0-12 8 (BALLOONS) IMPLANT
BLOCK BITE 60FR ADLT L/F GRN (MISCELLANEOUS) ×3 IMPLANT
CANISTER SUCT 1200ML W/VALVE (MISCELLANEOUS) ×3 IMPLANT
FCP ESCP3.2XJMB 240X2.8X (MISCELLANEOUS)
FORCEPS BIOP RAD 4 LRG CAP 4 (CUTTING FORCEPS) IMPLANT
FORCEPS BIOP RJ4 240 W/NDL (MISCELLANEOUS)
FORCEPS ESCP3.2XJMB 240X2.8X (MISCELLANEOUS) IMPLANT
GOWN CVR UNV OPN BCK APRN NK (MISCELLANEOUS) ×2 IMPLANT
GOWN ISOL THUMB LOOP REG UNIV (MISCELLANEOUS) ×4
HEMOCLIP INSTINCT (CLIP) IMPLANT
INJECTOR VARIJECT VIN23 (MISCELLANEOUS) IMPLANT
KIT CO2 TUBING (TUBING) IMPLANT
KIT DEFENDO VALVE AND CONN (KITS) IMPLANT
KIT ENDO PROCEDURE OLY (KITS) ×3 IMPLANT
LIGATOR MULTIBAND 6SHOOTER MBL (MISCELLANEOUS) IMPLANT
MARKER SPOT ENDO TATTOO 5ML (MISCELLANEOUS) IMPLANT
PAD GROUND ADULT SPLIT (MISCELLANEOUS) IMPLANT
SNARE SHORT THROW 13M SML OVAL (MISCELLANEOUS) IMPLANT
SNARE SHORT THROW 30M LRG OVAL (MISCELLANEOUS) IMPLANT
SPOT EX ENDOSCOPIC TATTOO (MISCELLANEOUS)
SUCTION POLY TRAP 4CHAMBER (MISCELLANEOUS) IMPLANT
SYR INFLATION 60ML (SYRINGE) IMPLANT
TRAP SUCTION POLY (MISCELLANEOUS) IMPLANT
TUBING CONN 6MMX3.1M (TUBING)
TUBING SUCTION CONN 0.25 STRL (TUBING) IMPLANT
UNDERPAD 30X60 958B10 (PK) (MISCELLANEOUS) IMPLANT
VALVE BIOPSY ENDO (VALVE) IMPLANT
VARIJECT INJECTOR VIN23 (MISCELLANEOUS)
WATER AUXILLARY (MISCELLANEOUS) IMPLANT
WATER STERILE IRR 250ML POUR (IV SOLUTION) ×3 IMPLANT
WATER STERILE IRR 500ML POUR (IV SOLUTION) IMPLANT
WIRE CRE 18-20MM 8CM F G (MISCELLANEOUS) IMPLANT

## 2014-12-11 NOTE — Anesthesia Procedure Notes (Signed)
Procedure Name: MAC Performed by: Aldora Perman Pre-anesthesia Checklist: Patient identified, Emergency Drugs available, Suction available, Timeout performed and Patient being monitored Patient Re-evaluated:Patient Re-evaluated prior to inductionOxygen Delivery Method: Nasal cannula Placement Confirmation: positive ETCO2     

## 2014-12-11 NOTE — Op Note (Signed)
[facility] Gastroenterology Patient Name: [name] Procedure Date: 12/11/2014 8:01 AM MRN: [id number] Account #: [id number] Date of Birth: November 26, 1935 Admit Type: Outpatient Age: 79 Room: [facility] OR ROOM 01 Gender: Female Note Status: Finalized Procedure:         Upper GI endoscopy Indications:       Dysphagia Providers:         [name], MD Referring MD:      Halina Maidens, MD (Referring MD) Medicines:         Propofol per Anesthesia Complications:     No immediate complications. Procedure:         Pre-Anesthesia Assessment:                    - Prior to the procedure, a History and Physical was                     performed, and patient medications and allergies were                     reviewed. The patient's tolerance of previous anesthesia                     was also reviewed. The risks and benefits of the procedure                     and the sedation options and risks were discussed with the                     patient. All questions were answered, and informed consent                     was obtained. Prior Anticoagulants: The patient has taken                     no previous anticoagulant or antiplatelet agents. ASA                     Grade Assessment: II - A patient with mild systemic                     disease. After reviewing the risks and benefits, the                     patient was deemed in satisfactory condition to undergo                     the procedure.                    After obtaining informed consent, the endoscope was passed                     under direct vision. Throughout the procedure, the                     patient's blood pressure, pulse, and oxygen saturations                     were monitored continuously. The Olympus GIF-HQ190                     Endoscope (S#. [id number]) was introduced through the mouth,  and advanced to the second part of duodenum. The upper GI                     endoscopy was  accomplished without difficulty. The patient                     tolerated the procedure well. Findings:      Diffuse candidiasis was found in the entire esophagus. Cells for       cytology were obtained by brushing.      The stomach was normal.      The examined duodenum was normal. Impression:        - Monilial esophagitis. Cells for cytology obtained.                    - Normal stomach.                    - Normal examined duodenum. Recommendation:    - Await pathology results.                    - Diflucan (fluconazole) 100 mg PO daily for 2 weeks. Procedure Code(s): --- Professional ---                    [phone], Esophagogastroduodenoscopy, flexible, transoral;                     diagnostic, including collection of specimen(s) by                     brushing or washing, when performed (separate procedure) Diagnosis Code(s): --- Professional ---                    R13.10, Dysphagia, unspecified                    B37.81, Candidal esophagitis CPT copyright 2014 American Medical Association. All rights reserved. The codes documented in this report are preliminary and upon coder review may  be revised to meet current compliance requirements. [name], MD 12/11/2014 8:11:26 AM This report has been signed electronically. Number of Addenda: 0 Note Initiated On: 12/11/2014 8:01 AM Total Procedure Duration: 0 hours 2 minutes 39 seconds       [facility]

## 2014-12-11 NOTE — Transfer of Care (Signed)
Immediate Anesthesia Transfer of Care Note  Patient: [name]  Procedure(s) Performed: Procedure(s): ESOPHAGOGASTRODUODENOSCOPY (EGD) WITH PROPOFOL (N/A)  Patient Location: PACU  Anesthesia Type: MAC  Level of Consciousness: awake, alert  and patient cooperative  Airway and Oxygen Therapy: Patient Spontanous Breathing and Patient connected to supplemental oxygen  Post-op Assessment: Post-op Vital signs reviewed, Patient's Cardiovascular Status Stable, Respiratory Function Stable, Patent Airway and No signs of Nausea or vomiting  Post-op Vital Signs: Reviewed and stable  Complications: No apparent anesthesia complications

## 2014-12-11 NOTE — Anesthesia Postprocedure Evaluation (Signed)
  Anesthesia Post-op Note  Patient: [name]  Procedure(s) Performed: Procedure(s) with comments: ESOPHAGOGASTRODUODENOSCOPY (EGD) WITH PROPOFOL (N/A) - cytology brushing  Anesthesia type:MAC  Patient location: PACU  Post pain: Pain level controlled  Post assessment: Post-op Vital signs reviewed, Patient's Cardiovascular Status Stable, Respiratory Function Stable, Patent Airway and No signs of Nausea or vomiting  Post vital signs: Reviewed and stable  Last Vitals:  Filed Vitals:   12/11/14 0828  BP:   Pulse: 80  Temp:   Resp: 20    Level of consciousness: awake, alert  and patient cooperative  Complications: No apparent anesthesia complications

## 2014-12-11 NOTE — H&P (Signed)
[facility]  [address], [place], [place] [address] Phone: [phone] Fax : [phone]  Primary Care Physician:  Halina Maidens, MD Primary Gastroenterologist:  Dr. Allen Norris  Pre-Procedure History & Physical: HPI:  [name] is a 79 y.o. female is here for an endoscopy.   Past Medical History  Diagnosis Date  . DM (diabetes mellitus)   . Mitral valve prolapse   . COPD (chronic obstructive pulmonary disease)   . Palpitations   . Osteoarthritis   . Systemic lupus erythematosus   . Lumbar spondylolysis   . Raynaud's disease   . Anxiety   . IBS (irritable bowel syndrome)   . Uveitis, anterior     ????  . Rheumatic fever   . Lupus     ? how diagnosed  . Ankylosing spondylitis     ? how diagnosed  . Insomnia, unspecified   . Chronic peptic ulcer, unspecified site, without mention of hemorrhage, perforation, or obstruction   . Paroxysmal tachycardia, unspecified   . History of kidney stones   . Chronic kidney infection   . Asthma     as child  . Pneumonia   . PONV (postoperative nausea and vomiting)   . Hypothyroidism     mass, U/S 6/21 - nodules  . Motion sickness     car - back seat  . GERD (gastroesophageal reflux disease)   . Wears dentures     full upper, partial lower  . Wears contact lenses     Past Surgical History  Procedure Laterality Date  . Esophagogastroduodenoscopy  multiple  . Colonoscopy  multiple  . Total abdominal hysterectomy w/ bilateral salpingoophorectomy  1990  . Breast biopsy    . Biopsy thyroid    . Abdominal hysterectomy      Prior to Admission medications   Medication Sig Start Date End Date Taking? Authorizing Provider  albuterol (PROVENTIL HFA;VENTOLIN HFA) 108 (90 BASE) MCG/ACT inhaler Inhale into the lungs 4 (four) times daily - after meals and at bedtime.    Yes Historical Provider, MD  albuterol (PROVENTIL) (2.5 MG/3ML) 0.083% nebulizer solution Take 2.5 mg by nebulization every 6 (six) hours  as needed for wheezing or shortness of breath.   Yes Historical Provider, MD  AMBULATORY NON FORMULARY MEDICATION Oxygen (USES AS NEEDED)   Yes Historical Provider, MD  LORazepam (ATIVAN) 1 MG tablet Take 0.5 mg by mouth daily as needed.    Yes Historical Provider, MD  mometasone-formoterol (DULERA) 100-5 MCG/ACT AERO Inhale 2 puffs into the lungs 2 (two) times daily.   Yes Historical Provider, MD  Probiotic Product (ALIGN PO) Take by mouth.   Yes Historical Provider, MD  ranitidine (ZANTAC) 150 MG tablet Take 150 mg by mouth 2 (two) times daily.   Yes Historical Provider, MD  sitaGLIPtin (JANUVIA) 100 MG tablet Take 100 mg by mouth daily.   Yes Historical Provider, MD  tiotropium (SPIRIVA) 18 MCG inhalation capsule Place 18 mcg into inhaler and inhale daily. midday   Yes Historical Provider, MD  dexlansoprazole (DEXILANT) 60 MG capsule Take 60 mg by mouth daily. midday    Historical Provider, MD  Fluticasone-Salmeterol (ADVAIR) 250-50 MCG/DOSE AEPB Inhale 1 puff into the lungs 2 (two) times daily.    Historical Provider, MD  levothyroxine (SYNTHROID, LEVOTHROID) 25 MCG tablet Take 25 mcg by mouth daily.    Historical Provider, MD  oxyCODONE-acetaminophen (PERCOCET/ROXICET) 5-325 MG per tablet Take 1 tablet by mouth every 4 (four) hours as needed for severe pain.  Historical Provider, MD  zolpidem (AMBIEN) 10 MG tablet Take 10 mg by mouth at bedtime.     Historical Provider, MD    Allergies as of 11/25/2014 - Review Complete 08/18/2013  Allergen Reaction Noted  . Latex  04/18/2013  . Penicillins Hives 04/18/2013  . Sulfa antibiotics  04/18/2013  . Erythromycin Rash 04/18/2013    Family History  Problem Relation Age of Onset  . Inflammatory bowel disease    . Ovarian cancer    . Diabetes    . Heart disease    . Irritable bowel syndrome    . Kidney disease    . Heart disease Mother   . Hypothyroidism Mother   . Diabetes Mother   . Heart attack Mother   . Hypertension Mother   .  Heart disease Father   . Heart attack Father   . Breast cancer Sister   . Hypothyroidism Sister   . Diabetes Sister   . Ovarian cancer Sister   . Diabetes Brother   . Prostate cancer Brother   . Hypothyroidism Daughter     History   Social History  . Marital Status: Married    Spouse Name: N/A  . Number of Children: 4  . Years of Education: N/A   Occupational History  . retired    Social History Main Topics  . Smoking status: Former Smoker -- 0.25 packs/day for 40 years    Types: Cigarettes    Quit date: 10/11/2011  . Smokeless tobacco: Never Used     Comment: Quit 4 months ago   . Alcohol Use: No  . Drug Use: No  . Sexual Activity: Not on file   Other Topics Concern  . Not on file   Social History Narrative   Married (#2)   Moved from [place] in Summer 2014 - Lives in [place]   1 son, 3 daughters (one lives in this area)   2 caffeinated beverages daily   04/18/2013 update    Review of Systems: See HPI, otherwise negative ROS  Physical Exam: BP 138/63 mmHg  Pulse 54  Temp(Src) 97.7 F (36.5 C)  Resp 17  Ht 5\' 4"  (1.626 m)  Wt 122 lb (55.339 kg)  BMI 20.93 kg/m2  SpO2 98% General:   Alert,  pleasant and cooperative in NAD Head:  Normocephalic and atraumatic. Neck:  Supple; no masses or thyromegaly. Lungs:  Clear throughout to auscultation.    Heart:  Regular rate and rhythm. Abdomen:  Soft, nontender and nondistended. Normal bowel sounds, without guarding, and without rebound.   Neurologic:  Alert and  oriented x4;  grossly normal neurologically.  Impression/Plan: [name] is here for an endoscopy to be performed for GERD and Dysphagia.  Risks, benefits, limitations, and alternatives regarding  endoscopy have been reviewed with the patient.  Questions have been answered.  All parties agreeable.   Ollen Bowl, MD  12/11/2014, 7:49 AM

## 2014-12-15 ENCOUNTER — Encounter: Payer: Self-pay | Admitting: Gastroenterology

## 2014-12-16 DIAGNOSIS — B3781 Candidal esophagitis: Secondary | ICD-10-CM | POA: Insufficient documentation

## 2014-12-16 DIAGNOSIS — R938 Abnormal findings on diagnostic imaging of other specified body structures: Secondary | ICD-10-CM | POA: Diagnosis not present

## 2014-12-16 DIAGNOSIS — R7989 Other specified abnormal findings of blood chemistry: Secondary | ICD-10-CM | POA: Diagnosis not present

## 2014-12-16 DIAGNOSIS — E1129 Type 2 diabetes mellitus with other diabetic kidney complication: Secondary | ICD-10-CM | POA: Diagnosis not present

## 2014-12-16 DIAGNOSIS — R1310 Dysphagia, unspecified: Secondary | ICD-10-CM | POA: Insufficient documentation

## 2014-12-16 DIAGNOSIS — E042 Nontoxic multinodular goiter: Secondary | ICD-10-CM | POA: Diagnosis not present

## 2014-12-17 ENCOUNTER — Encounter: Payer: Self-pay | Admitting: Gastroenterology

## 2014-12-18 DIAGNOSIS — R7989 Other specified abnormal findings of blood chemistry: Secondary | ICD-10-CM | POA: Diagnosis not present

## 2014-12-18 DIAGNOSIS — R938 Abnormal findings on diagnostic imaging of other specified body structures: Secondary | ICD-10-CM | POA: Diagnosis not present

## 2014-12-25 ENCOUNTER — Other Ambulatory Visit: Payer: Self-pay

## 2014-12-25 ENCOUNTER — Telehealth: Payer: Self-pay | Admitting: Gastroenterology

## 2014-12-25 DIAGNOSIS — B3781 Candidal esophagitis: Secondary | ICD-10-CM

## 2014-12-25 MED ORDER — FLUCONAZOLE 100 MG PO TABS: 100.0000 mg | ORAL_TABLET | Freq: Every day | ORAL | Status: DC

## 2014-12-25 NOTE — Telephone Encounter (Signed)
Pt still feels like the yeast infection has not cleared up. Still sounds hoarse. Advised pt Dr. Allen Norris was out of the office until July 25th. Continue taking the Zantac until his return and we can see if he will want to change.

## 2014-12-25 NOTE — Telephone Encounter (Signed)
Pt called. Still having some trouble with reflux, better in the morning but gets worse as the day goes. She might need more medication.

## 2014-12-31 DIAGNOSIS — H04123 Dry eye syndrome of bilateral lacrimal glands: Secondary | ICD-10-CM | POA: Diagnosis not present

## 2015-01-05 ENCOUNTER — Other Ambulatory Visit: Payer: Self-pay | Admitting: Internal Medicine

## 2015-01-05 DIAGNOSIS — K279 Peptic ulcer, site unspecified, unspecified as acute or chronic, without hemorrhage or perforation: Secondary | ICD-10-CM | POA: Insufficient documentation

## 2015-01-15 ENCOUNTER — Other Ambulatory Visit: Payer: Self-pay

## 2015-01-15 ENCOUNTER — Telehealth: Payer: Self-pay

## 2015-01-15 DIAGNOSIS — R3129 Other microscopic hematuria: Secondary | ICD-10-CM

## 2015-01-15 NOTE — Telephone Encounter (Signed)
Patient called stating that she continues to have acid reflux. She would like to know if there is any way that you could refill medication or if she should start taking another. Please advice.

## 2015-01-18 ENCOUNTER — Other Ambulatory Visit: Payer: Self-pay

## 2015-01-19 MED ORDER — SITAGLIPTIN PHOSPHATE 100 MG PO TABS: 50.0000 mg | ORAL_TABLET | Freq: Every day | ORAL | Status: DC

## 2015-01-19 MED ORDER — LORAZEPAM 1 MG PO TABS: 0.5000 mg | ORAL_TABLET | Freq: Every day | ORAL | Status: DC | PRN

## 2015-01-19 NOTE — Telephone Encounter (Signed)
Consulted with Dr. Allen Norris regarding pts symptoms. He stated that if she has been taking her PPI daily and still experiencing symptoms then he feels it may not be reflux related. Advised to discuss with her PCP to discuss COPD meds and maybe switch to see if the hoarseness goes away. Called pt and informed her of Wohl's recommendations. Pt will call Dr. Vella Kohler tomorrow and schedule a follow up appt. She will also restart her Zyrtec to help with allergy symptoms.

## 2015-01-19 NOTE — Telephone Encounter (Signed)
Spoke with pt today regarding her reflux medication. Pt not sure if the reflux is the cause for the hoarseness she has been experiencing. She does several inhalers daily for her COPD and now wonders if this is the cause. Advised pt I would speak with Dr. Allen Norris and see what the next step with be with her symptoms. Will call her back today.

## 2015-01-29 ENCOUNTER — Telehealth: Payer: Self-pay

## 2015-01-29 ENCOUNTER — Other Ambulatory Visit: Payer: Self-pay

## 2015-01-29 MED ORDER — SITAGLIPTIN PHOSPHATE 100 MG PO TABS: 50.0000 mg | ORAL_TABLET | Freq: Every day | ORAL | Status: DC

## 2015-01-29 MED ORDER — LORAZEPAM 1 MG PO TABS: 0.5000 mg | ORAL_TABLET | Freq: Every day | ORAL | Status: DC | PRN

## 2015-01-29 NOTE — Telephone Encounter (Signed)
Im not going to do that because I am not managing.  I'm not convinced that she is compliant with her medication and I prefer Endo to do the prescribing and monitoring.

## 2015-01-29 NOTE — Telephone Encounter (Signed)
Patient states endo told her to get her diabetic medication from you.dr

## 2015-02-01 NOTE — Telephone Encounter (Signed)
done

## 2015-02-11 DIAGNOSIS — H2512 Age-related nuclear cataract, left eye: Secondary | ICD-10-CM | POA: Diagnosis not present

## 2015-02-11 NOTE — Patient Outreach (Signed)
[place] [facility]) Care Management  02/11/2015  [name] August 20, 1935 [id number]   Referral from [place] List, assigned Janci Minor, RN to outreach.  Thanks, Ronnell Freshwater. [place], [place] Assistant Phone: [phone] Fax: [phone]

## 2015-02-22 ENCOUNTER — Other Ambulatory Visit: Payer: Self-pay

## 2015-02-22 MED ORDER — LORAZEPAM 1 MG PO TABS: 0.5000 mg | ORAL_TABLET | Freq: Two times a day (BID) | ORAL | Status: DC | PRN

## 2015-02-22 MED ORDER — SITAGLIPTIN PHOSPHATE 50 MG PO TABS: 50.0000 mg | ORAL_TABLET | Freq: Every day | ORAL | Status: DC

## 2015-02-23 DIAGNOSIS — H169 Unspecified keratitis: Secondary | ICD-10-CM | POA: Diagnosis not present

## 2015-02-23 NOTE — Patient Outreach (Signed)
[facility]) Care Management  02/23/2015  Ouida Abeyta 03-17-1936 [id number]   Referral from high risk list reassigned to Quinn Plowman, [facility] for patient outreach.  Marylin Lathon L. Tanav Orsak, [place] Care Management Assistant

## 2015-02-24 ENCOUNTER — Ambulatory Visit (INDEPENDENT_AMBULATORY_CARE_PROVIDER_SITE_OTHER): Payer: Medicare Other | Admitting: Internal Medicine

## 2015-02-24 ENCOUNTER — Telehealth: Payer: Self-pay | Admitting: Gastroenterology

## 2015-02-24 ENCOUNTER — Other Ambulatory Visit: Payer: Self-pay

## 2015-02-24 ENCOUNTER — Encounter: Payer: Self-pay | Admitting: Internal Medicine

## 2015-02-24 VITALS — BP 142/72 | HR 88 | Ht 64.0 in | Wt 123.2 lb

## 2015-02-24 DIAGNOSIS — J449 Chronic obstructive pulmonary disease, unspecified: Secondary | ICD-10-CM | POA: Diagnosis not present

## 2015-02-24 DIAGNOSIS — R682 Dry mouth, unspecified: Secondary | ICD-10-CM | POA: Diagnosis not present

## 2015-02-24 DIAGNOSIS — E119 Type 2 diabetes mellitus without complications: Secondary | ICD-10-CM | POA: Diagnosis not present

## 2015-02-24 DIAGNOSIS — J441 Chronic obstructive pulmonary disease with (acute) exacerbation: Secondary | ICD-10-CM

## 2015-02-24 DIAGNOSIS — B3781 Candidal esophagitis: Secondary | ICD-10-CM

## 2015-02-24 MED ORDER — NYSTATIN 100000 UNIT/ML MT SUSP: 5.0000 mL | Freq: Four times a day (QID) | OROMUCOSAL | Status: DC

## 2015-02-24 NOTE — Progress Notes (Signed)
Date:  02/24/2015   Name:  [name]   DOB:  November 28, 1935   MRN:  [id number]   Chief Complaint: Diabetes Diabetes She presents for her follow-up diabetic visit. She has type 2 diabetes mellitus. Her disease course has been stable. Pertinent negatives for hypoglycemia include no confusion. Pertinent negatives for diabetes include no chest pain, no fatigue, no polyphagia and no polyuria. Symptoms are stable. She is compliant with treatment all of the time. Her weight is stable. She is following a generally healthy diet. Her breakfast blood glucose is taken between 7-8 am. Her breakfast blood glucose range is generally 110-130 mg/dl. An ACE inhibitor/angiotensin II receptor blocker is not being taken.   COPD - patient developed a yeast esophagitis so her Ruthe Mannan was discontinued. Her pulmonary physician put her on brovana. She said the medication was not well tolerated it made her feel weak jittery and gave her leg pain so she stopped it. Her current medications are Spiriva and albuterol nebulizer.  YEAST ESOPHAGITIS -Fungal esophagitis was diagnosed by EGD 6 weeks ago. She took a course of fluconazole and her symptoms are much improved. She still has a very dry mouth with decreased taste. She has tried Biotene without much improvement. She's not sure if she is due for a GI follow-up to document resolution.  Review of Systems:  Review of Systems  Constitutional: Negative for fever, fatigue and unexpected weight change.  HENT: Positive for mouth sores. Negative for hearing loss (very dry mouth without ulcers), trouble swallowing and voice change.   Eyes: Negative for visual disturbance.  Respiratory: Positive for shortness of breath. Negative for cough, choking and stridor.   Cardiovascular: Negative for chest pain and palpitations.  Gastrointestinal: Negative for abdominal pain.  Endocrine: Negative for polyphagia and polyuria.  Genitourinary: Positive for difficulty urinating (intermittent  difficulty emptying her bladder). Negative for dysuria, hematuria and flank pain.  Musculoskeletal: Negative for back pain and gait problem.  Skin: Negative for rash and wound.  Psychiatric/Behavioral: Negative for confusion and decreased concentration.    Patient Active Problem List   Diagnosis Date Noted  . Gastroduodenal ulcer 01/05/2015  . Difficulty swallowing solids   . Esophageal candidiasis   . Acquired hypothyroidism 08/18/2014  . Diabetes mellitus, type 2 08/18/2014  . Big thyroid 04/06/2014  . Disseminated lupus erythematosus 04/03/2014  . Palpitations 04/25/2013  . On home oxygen therapy - prn 04/19/2013  . COPD (chronic obstructive pulmonary disease) 04/19/2013    Prior to Admission medications   Medication Sig Start Date End Date Taking? Authorizing Provider  albuterol (PROVENTIL HFA;VENTOLIN HFA) 108 (90 BASE) MCG/ACT inhaler Inhale into the lungs 4 (four) times daily - after meals and at bedtime.    Yes Historical Provider, MD  albuterol (PROVENTIL) (2.5 MG/3ML) 0.083% nebulizer solution Take 2.5 mg by nebulization every 6 (six) hours as needed for wheezing or shortness of breath.   Yes Historical Provider, MD  AMBULATORY NON FORMULARY MEDICATION Oxygen (USES AS NEEDED)   Yes Historical Provider, MD  LORazepam (ATIVAN) 1 MG tablet Take 0.5 tablets (0.5 mg total) by mouth 2 (two) times daily as needed. 02/22/15  Yes Glean Hess, MD  ranitidine (ZANTAC) 150 MG tablet Take 150 mg by mouth 2 (two) times daily.   Yes Historical Provider, MD  sitaGLIPtin (JANUVIA) 50 MG tablet Take 1 tablet (50 mg total) by mouth daily. 02/22/15  Yes Glean Hess, MD  tiotropium (SPIRIVA) 18 MCG inhalation capsule Place 18 mcg into inhaler and inhale daily.  midday   Yes Historical Provider, MD  Probiotic Product (ALIGN PO) Take by mouth.    Historical Provider, MD  zolpidem (AMBIEN) 10 MG tablet Take 10 mg by mouth at bedtime.     Historical Provider, MD    Allergies  Allergen  Reactions  . Codeine Nausea And Vomiting  . Penicillins Hives  . Sulfa Antibiotics   . Erythromycin Rash  . Latex Rash    Past Surgical History  Procedure Laterality Date  . Esophagogastroduodenoscopy  multiple  . Colonoscopy  multiple  . Total abdominal hysterectomy w/ bilateral salpingoophorectomy  1990  . Breast biopsy    . Biopsy thyroid    . Abdominal hysterectomy    . Esophagogastroduodenoscopy (egd) with propofol N/A 12/11/2014    Procedure: ESOPHAGOGASTRODUODENOSCOPY (EGD) WITH PROPOFOL;  Surgeon: [name], MD;  Location: [place];  Service: Endoscopy;  Laterality: N/A;  cytology brushing    Social History  Substance Use Topics  . Smoking status: Former Smoker -- 0.25 packs/day for 40 years    Types: Cigarettes    Quit date: 10/11/2011  . Smokeless tobacco: Never Used     Comment: Quit 4 months ago   . Alcohol Use: No     Medication list has been reviewed and updated.  Physical Examination:  Physical Exam  Constitutional: She is oriented to person, place, and time. She appears well-developed. No distress.  HENT:  Head: Normocephalic and atraumatic.  Mouth/Throat: Mucous membranes are dry. No oropharyngeal exudate or posterior oropharyngeal edema.  Very dry oral membranes and dry red tongue. No white plaques or ulcers are seen.  Eyes: Conjunctivae are normal. Right eye exhibits no discharge. Left eye exhibits no discharge. No scleral icterus.  Cardiovascular: Normal rate, regular rhythm and S1 normal.   Pulses:      Dorsalis pedis pulses are 1+ on the right side, and 1+ on the left side.  Cool dusky toes are noted consistent with Raynaud's syndrome  Pulmonary/Chest: Effort normal and breath sounds normal. No respiratory distress.  Musculoskeletal: Normal range of motion.  Neurological: She is alert and oriented to person, place, and time.  Skin: Skin is warm and dry. No rash noted.  Psychiatric: She has a normal mood and affect. Her behavior is  normal. Thought content normal.    BP 142/72 mmHg  Pulse 88  Ht 5\' 4"  (1.626 m)  Wt 123 lb 3.2 oz (55.883 kg)  BMI 21.14 kg/m2  SpO2 95%  Assessment and Plan: 1. Type 2 diabetes mellitus without complication Continue Januvia and dietary modifications - Hemoglobin A1c  2. Chronic obstructive pulmonary disease, unspecified COPD, unspecified chronic bronchitis type Discontinue Spiriva Samples of Anoro are given - one inhalation daily  3. Esophageal candidiasis Follow-up with GI regarding repeat EGD  4. Dry mouth We'll treat empirically for thrush - nystatin (MYCOSTATIN) 100000 UNIT/ML suspension; Take 5 mLs (500,000 Units total) by mouth 4 (four) times daily.  Dispense: 60 mL; Refill: 0   Halina Maidens, MD [facility]  02/24/2015

## 2015-02-24 NOTE — Patient Outreach (Signed)
[facility] [facility]) Care Management  02/24/2015  [name] 21-Aug-1935 [id number]   Request from Quinn Plowman, RN to assign [name], assigned [name].  Thanks, Ronnell Freshwater. [place], [place] Assistant Phone: [phone] Fax: [phone]

## 2015-02-24 NOTE — Telephone Encounter (Signed)
Patient would like to know if her problem is all taken care of now or is there something else she needs to do. Please call.

## 2015-02-24 NOTE — Patient Outreach (Addendum)
[facility]) Care Management  [place]  02/24/2015   [name] 07-09-1935 [id number]  Subjective: Telephone call to patient regarding high risk referral.  Discussed and offered [facility] care management services. Patient verbally agreed to receive services.   Patient states she has recently had some health issues that has caused her COPD to worsen.  Patient states she was recently treated for a urinary tract infection and sinus infection.  Patient states she developed yeast infection, yeast in her esophagus and mouth.  Patient states she had endoscopy to confirm.  Patient states her pulmonologist discontinued Dulera due to possible cause of yeast infection.  Patient states she is now more symptomatic regarding her COPD. Patient states she does not have an action plan for COPD. Patient states she becomes a little confused about the medications trying to determine if she takes all of the medication along with her nebulizer treatments. Patient states she has also become more anxious due to her COPD worsening.Patient states she is aware her breathing has become more labored since the development of the yeast infection. Patient states she uses her nebulizer and uses her oxygen at 2L as needed.  Patient states she has completed her medication for the infection but may need to follow up again because she still awakens in the  Morning with extreme mouth dryness and film in her mouth. Patient states she discussed this with her primary MD on today.   Patient states she follows up with her primary and pulmonologist regularly.  Patient states she had pneumonia twice last year. Patient states she also has asthma and seasonal allergies.  Patient states she is seen by Dr. Mickie Kay, endocrinologist for her thyroid.  Patient states she is scheduled to have cataract surgery on March 23, 2015.  Patient states she is followed by her primary MD regarding her diabetes.  States her doctor had an  A1c lab done on her today.  Patient states she feels very comfortable regarding the management of her diabetes.  Patient states she needs more assistance and education with her COPD.  Patient states there are times she has questions regarding navigating through the health care system.   Objective: weight 123lbs and height 5'4, as reported by patient.   Current Medications:  Current Outpatient Prescriptions  Medication Sig Dispense Refill  . albuterol (PROVENTIL HFA;VENTOLIN HFA) 108 (90 BASE) MCG/ACT inhaler Inhale into the lungs 4 (four) times daily - after meals and at bedtime.     Marland Kitchen albuterol (PROVENTIL) (2.5 MG/3ML) 0.083% nebulizer solution Take 2.5 mg by nebulization every 6 (six) hours as needed for wheezing or shortness of breath.    . AMBULATORY NON FORMULARY MEDICATION Oxygen (USES AS NEEDED)    . LORazepam (ATIVAN) 1 MG tablet Take 0.5 tablets (0.5 mg total) by mouth 2 (two) times daily as needed. 90 tablet 0  . nystatin (MYCOSTATIN) 100000 UNIT/ML suspension Take 5 mLs (500,000 Units total) by mouth 4 (four) times daily. 60 mL 0  . Probiotic Product (ALIGN PO) Take by mouth.    . ranitidine (ZANTAC) 150 MG tablet Take 150 mg by mouth 2 (two) times daily.    . sitaGLIPtin (JANUVIA) 50 MG tablet Take 1 tablet (50 mg total) by mouth daily. 90 tablet 0  . tiotropium (SPIRIVA) 18 MCG inhalation capsule Place 18 mcg into inhaler and inhale daily. midday    . zolpidem (AMBIEN) 10 MG tablet Take 10 mg by mouth at bedtime.      No current  facility-administered medications for this visit.    Functional Status:  In your present state of health, do you have any difficulty performing the following activities: 12/11/2014  Hearing? N  Vision? N  Difficulty concentrating or making decisions? N  Walking or climbing stairs? N  Dressing or bathing? N    Fall/Depression Screening: PHQ 2/9 Scores 02/24/2015 02/24/2015  PHQ - 2 Score 2 2  PHQ- 9 Score 5 4    Assessment: High risk referral.   PHQ2 -2 PHQ9 - 5,  Patient states she takes and anxiety medication that she feels is helping her.   Plan: RNCM will refer patient to health coach.   Quinn Plowman RN,BSN,CCM [place] Coordinator [phone]

## 2015-02-25 LAB — HEMOGLOBIN A1C
ESTIMATED AVERAGE GLUCOSE: 148 mg/dL
HEMOGLOBIN A1C: 6.8 % — AB (ref 4.8–5.6)

## 2015-02-25 NOTE — Telephone Encounter (Signed)
Spoke with pt today regarding her hoarseness. Pt stated she saw Dr. Army Melia and she prescribed her nystatin. She also stated her pulmonologist changed her COPD medication. Advised her to follow these instructions given by her PCP and pulmonologist. If symptoms do not improve after she completes the nystatin then she needs to call me back and we will schedule a follow up with Dr. Allen Norris. Pt verbalized understanding and will call back if she needs an appt.

## 2015-03-12 DIAGNOSIS — H2512 Age-related nuclear cataract, left eye: Secondary | ICD-10-CM | POA: Diagnosis not present

## 2015-03-15 ENCOUNTER — Encounter: Payer: Self-pay | Admitting: *Deleted

## 2015-03-22 ENCOUNTER — Other Ambulatory Visit: Payer: Self-pay | Admitting: Specialist

## 2015-03-22 DIAGNOSIS — R911 Solitary pulmonary nodule: Secondary | ICD-10-CM

## 2015-03-22 DIAGNOSIS — J449 Chronic obstructive pulmonary disease, unspecified: Secondary | ICD-10-CM | POA: Diagnosis not present

## 2015-03-22 DIAGNOSIS — M329 Systemic lupus erythematosus, unspecified: Secondary | ICD-10-CM | POA: Diagnosis not present

## 2015-03-23 ENCOUNTER — Ambulatory Visit
Admission: RE | Admit: 2015-03-23 | Discharge: 2015-03-23 | Disposition: A | Discharge status: Home / Self Care | Payer: Medicare Other | Source: Ambulatory Visit | Attending: Ophthalmology | Admitting: Ophthalmology

## 2015-03-23 ENCOUNTER — Encounter
Admission: RE | Disposition: A | Discharge status: Home / Self Care | Payer: Self-pay | Source: Ambulatory Visit | Attending: Ophthalmology

## 2015-03-23 ENCOUNTER — Ambulatory Visit: Payer: Medicare Other | Admitting: Anesthesiology

## 2015-03-23 ENCOUNTER — Encounter: Payer: Self-pay | Admitting: *Deleted

## 2015-03-23 DIAGNOSIS — G43909 Migraine, unspecified, not intractable, without status migrainosus: Secondary | ICD-10-CM

## 2015-03-23 DIAGNOSIS — Z9981 Dependence on supplemental oxygen: Secondary | ICD-10-CM | POA: Insufficient documentation

## 2015-03-23 DIAGNOSIS — I7300 Raynaud's syndrome without gangrene: Secondary | ICD-10-CM | POA: Insufficient documentation

## 2015-03-23 DIAGNOSIS — Z885 Allergy status to narcotic agent status: Secondary | ICD-10-CM | POA: Insufficient documentation

## 2015-03-23 DIAGNOSIS — R011 Cardiac murmur, unspecified: Secondary | ICD-10-CM

## 2015-03-23 DIAGNOSIS — M1990 Unspecified osteoarthritis, unspecified site: Secondary | ICD-10-CM

## 2015-03-23 DIAGNOSIS — Z882 Allergy status to sulfonamides status: Secondary | ICD-10-CM

## 2015-03-23 DIAGNOSIS — K219 Gastro-esophageal reflux disease without esophagitis: Secondary | ICD-10-CM | POA: Insufficient documentation

## 2015-03-23 DIAGNOSIS — H2512 Age-related nuclear cataract, left eye: Secondary | ICD-10-CM

## 2015-03-23 DIAGNOSIS — F419 Anxiety disorder, unspecified: Secondary | ICD-10-CM | POA: Diagnosis not present

## 2015-03-23 DIAGNOSIS — Z8673 Personal history of transient ischemic attack (TIA), and cerebral infarction without residual deficits: Secondary | ICD-10-CM

## 2015-03-23 DIAGNOSIS — E785 Hyperlipidemia, unspecified: Secondary | ICD-10-CM | POA: Insufficient documentation

## 2015-03-23 DIAGNOSIS — Z881 Allergy status to other antibiotic agents status: Secondary | ICD-10-CM | POA: Insufficient documentation

## 2015-03-23 DIAGNOSIS — G629 Polyneuropathy, unspecified: Secondary | ICD-10-CM

## 2015-03-23 DIAGNOSIS — J449 Chronic obstructive pulmonary disease, unspecified: Secondary | ICD-10-CM | POA: Insufficient documentation

## 2015-03-23 DIAGNOSIS — J45909 Unspecified asthma, uncomplicated: Secondary | ICD-10-CM | POA: Insufficient documentation

## 2015-03-23 DIAGNOSIS — I10 Essential (primary) hypertension: Secondary | ICD-10-CM

## 2015-03-23 DIAGNOSIS — Z87891 Personal history of nicotine dependence: Secondary | ICD-10-CM | POA: Insufficient documentation

## 2015-03-23 DIAGNOSIS — M459 Ankylosing spondylitis of unspecified sites in spine: Secondary | ICD-10-CM

## 2015-03-23 DIAGNOSIS — Z880 Allergy status to penicillin: Secondary | ICD-10-CM | POA: Insufficient documentation

## 2015-03-23 DIAGNOSIS — E1140 Type 2 diabetes mellitus with diabetic neuropathy, unspecified: Secondary | ICD-10-CM

## 2015-03-23 DIAGNOSIS — M329 Systemic lupus erythematosus, unspecified: Secondary | ICD-10-CM

## 2015-03-23 DIAGNOSIS — Z951 Presence of aortocoronary bypass graft: Secondary | ICD-10-CM | POA: Insufficient documentation

## 2015-03-23 DIAGNOSIS — I2510 Atherosclerotic heart disease of native coronary artery without angina pectoris: Secondary | ICD-10-CM

## 2015-03-23 DIAGNOSIS — H269 Unspecified cataract: Secondary | ICD-10-CM | POA: Diagnosis not present

## 2015-03-23 DIAGNOSIS — I341 Nonrheumatic mitral (valve) prolapse: Secondary | ICD-10-CM

## 2015-03-23 HISTORY — PX: CATARACT EXTRACTION W/PHACO: SHX586

## 2015-03-23 HISTORY — DX: Headache, unspecified: R51.9

## 2015-03-23 HISTORY — DX: Headache: R51

## 2015-03-23 HISTORY — DX: Wheezing: R06.2

## 2015-03-23 HISTORY — DX: Raynaud's syndrome without gangrene: I73.00

## 2015-03-23 HISTORY — DX: Polyneuropathy, unspecified: G62.9

## 2015-03-23 LAB — GLUCOSE, CAPILLARY: Glucose-Capillary: 138 mg/dL — ABNORMAL HIGH (ref 65–99)

## 2015-03-23 SURGERY — PHACOEMULSIFICATION, CATARACT, WITH IOL INSERTION
Anesthesia: Monitor Anesthesia Care | Laterality: Left | Wound class: Clean

## 2015-03-23 MED ORDER — TETRACAINE HCL 0.5 % OP SOLN
1.0000 [drp] | OPHTHALMIC | Status: AC | PRN
  Administered 2015-03-23: 1 [drp] via OPHTHALMIC

## 2015-03-23 MED ORDER — MOXIFLOXACIN HCL 0.5 % OP SOLN: 1.0000 [drp] | OPHTHALMIC | Status: DC | PRN

## 2015-03-23 MED ORDER — SODIUM CHLORIDE 0.9 % IV SOLN
INTRAVENOUS | Status: DC
Start: 2015-03-23 — End: 2015-03-23
  Administered 2015-03-23: 08:00:00 via INTRAVENOUS

## 2015-03-23 MED ORDER — NA CHONDROIT SULF-NA HYALURON 40-17 MG/ML IO SOLN
INTRAOCULAR | Status: DC | PRN
  Administered 2015-03-23: 1 mL via INTRAOCULAR

## 2015-03-23 MED ORDER — TETRACAINE HCL 0.5 % OP SOLN
OPHTHALMIC | Status: AC
  Filled 2015-03-23: qty 2

## 2015-03-23 MED ORDER — ARMC OPHTHALMIC DILATING GEL: 1.0000 "application " | OPHTHALMIC | Status: DC | PRN

## 2015-03-23 MED ORDER — ACETYLCHOLINE CHLORIDE 20 MG IO SOLR
INTRAOCULAR | Status: AC
  Filled 2015-03-23: qty 1

## 2015-03-23 MED ORDER — ARMC OPHTHALMIC DILATING GEL
OPHTHALMIC | Status: DC
Start: 2015-03-23 — End: 2015-03-23
  Filled 2015-03-23: qty 0.25

## 2015-03-23 MED ORDER — EPINEPHRINE HCL 1 MG/ML IJ SOLN
INTRAOCULAR | Status: DC | PRN
  Administered 2015-03-23: 09:00:00 via OPHTHALMIC

## 2015-03-23 MED ORDER — POVIDONE-IODINE 5 % OP SOLN
OPHTHALMIC | Status: AC
  Filled 2015-03-23: qty 30

## 2015-03-23 MED ORDER — POVIDONE-IODINE 5 % OP SOLN
1.0000 "application " | OPHTHALMIC | Status: AC | PRN
  Administered 2015-03-23: 1 via OPHTHALMIC

## 2015-03-23 MED ORDER — FENTANYL CITRATE (PF) 100 MCG/2ML IJ SOLN
INTRAMUSCULAR | Status: DC | PRN
  Administered 2015-03-23: 50 ug via INTRAVENOUS

## 2015-03-23 MED ORDER — ACETYLCHOLINE CHLORIDE 20 MG IO SOLR
INTRAOCULAR | Status: DC | PRN
  Administered 2015-03-23: 5 mg via INTRAOCULAR

## 2015-03-23 MED ORDER — MIDAZOLAM HCL 2 MG/2ML IJ SOLN
INTRAMUSCULAR | Status: DC | PRN
  Administered 2015-03-23: 1 mg via INTRAVENOUS

## 2015-03-23 MED ORDER — MOXIFLOXACIN HCL 0.5 % OP SOLN
OPHTHALMIC | Status: DC | PRN
  Administered 2015-03-23: 1 [drp] via OPHTHALMIC

## 2015-03-23 MED ORDER — MOXIFLOXACIN HCL 0.5 % OP SOLN
OPHTHALMIC | Status: AC
  Filled 2015-03-23: qty 3

## 2015-03-23 MED ORDER — EPINEPHRINE HCL 1 MG/ML IJ SOLN
INTRAMUSCULAR | Status: AC
  Filled 2015-03-23: qty 1

## 2015-03-23 SURGICAL SUPPLY — 22 items
CANNULA ANT/CHMB 27GA (MISCELLANEOUS) ×3 IMPLANT
CUP MEDICINE 2OZ PLAST GRAD ST (MISCELLANEOUS) ×3 IMPLANT
GLOVE BIO SURGEON STRL SZ8 (GLOVE) ×3 IMPLANT
GLOVE BIOGEL M 6.5 STRL (GLOVE) ×3 IMPLANT
GLOVE SURG LX 8.0 MICRO (GLOVE) ×2
GLOVE SURG LX STRL 8.0 MICRO (GLOVE) ×1 IMPLANT
GOWN STRL REUS W/ TWL LRG LVL3 (GOWN DISPOSABLE) ×2 IMPLANT
GOWN STRL REUS W/TWL LRG LVL3 (GOWN DISPOSABLE) ×4
LENS IOL TECNIS 14.5 (Intraocular Lens) ×3 IMPLANT
LENS IOL TECNIS MONO 1P 14.5 (Intraocular Lens) ×1 IMPLANT
PACK CATARACT (MISCELLANEOUS) ×3 IMPLANT
PACK CATARACT BRASINGTON LX (MISCELLANEOUS) ×3 IMPLANT
PACK EYE AFTER SURG (MISCELLANEOUS) ×3 IMPLANT
SOL BSS BAG (MISCELLANEOUS) ×3
SOL PREP PVP 2OZ (MISCELLANEOUS) ×3
SOLUTION BSS BAG (MISCELLANEOUS) ×1 IMPLANT
SOLUTION PREP PVP 2OZ (MISCELLANEOUS) ×1 IMPLANT
SYR 3ML LL SCALE MARK (SYRINGE) ×3 IMPLANT
SYR 5ML LL (SYRINGE) ×3 IMPLANT
SYR TB 1ML 27GX1/2 LL (SYRINGE) ×3 IMPLANT
WATER STERILE IRR 1000ML POUR (IV SOLUTION) ×3 IMPLANT
WIPE NON LINTING 3.25X3.25 (MISCELLANEOUS) ×3 IMPLANT

## 2015-03-23 NOTE — Anesthesia Preprocedure Evaluation (Signed)
Anesthesia Evaluation  Patient identified by MRN, date of birth, ID band Patient awake    Reviewed: Allergy & Precautions, NPO status , Patient's Chart, lab work & pertinent test results  History of Anesthesia Complications (+) PONV  Airway Mallampati: II       Dental  (+) Partial Lower, Upper Dentures   Pulmonary COPD,  COPD inhaler, former smoker,           Cardiovascular negative cardio ROS  + Valvular Problems/Murmurs MVP      Neuro/Psych Anxiety negative neurological ROS     GI/Hepatic PUD, GERD  Medicated,  Endo/Other  diabetes, Type 2Hypothyroidism   Renal/GU Renal disease (stones)     Musculoskeletal   Abdominal   Peds  Hematology   Anesthesia Other Findings   Reproductive/Obstetrics                             Anesthesia Physical Anesthesia Plan  ASA: III  Anesthesia Plan: MAC   Post-op Pain Management:    Induction: Intravenous  Airway Management Planned: Nasal Cannula  Additional Equipment:   Intra-op Plan:   Post-operative Plan:   Informed Consent: I have reviewed the patients History and Physical, chart, labs and discussed the procedure including the risks, benefits and alternatives for the proposed anesthesia with the patient or authorized representative who has indicated his/her understanding and acceptance.     Plan Discussed with:   Anesthesia Plan Comments:         Anesthesia Quick Evaluation

## 2015-03-23 NOTE — Op Note (Signed)
PREOPERATIVE DIAGNOSIS:  Nuclear sclerotic cataract of the left eye.   POSTOPERATIVE DIAGNOSIS:  Nuclear sclerotic cataract of the left eye.   OPERATIVE PROCEDURE: Procedure(s): CATARACT EXTRACTION PHACO AND INTRAOCULAR LENS PLACEMENT (IOC)   SURGEON:  Birder Robson, MD.   ANESTHESIA:  Anesthesiologist: Gunnar Fusi, MD CRNA: Jonna Clark, CRNA  1.      Managed anesthesia care. 2.      Topical tetracaine drops followed by 2% Xylocaine jelly applied in the preoperative holding area.   COMPLICATIONS:  None.   TECHNIQUE:   Stop and chop   DESCRIPTION OF PROCEDURE:  The patient was examined and consented in the preoperative holding area where the aforementioned topical anesthesia was applied to the left eye and then brought back to the Operating Room where the left eye was prepped and draped in the usual sterile ophthalmic fashion and a lid speculum was placed. A paracentesis was created with the side port blade and the anterior chamber was filled with viscoelastic. A near clear corneal incision was performed with the steel keratome. A continuous curvilinear capsulorrhexis was performed with a cystotome followed by the capsulorrhexis forceps. Hydrodissection and hydrodelineation were carried out with BSS on a blunt cannula. The lens was removed in a stop and chop  technique and the remaining cortical material was removed with the irrigation-aspiration handpiece. The capsular bag was inflated with viscoelastic and the Technis ZCB00 lens was placed in the capsular bag without complication. The remaining viscoelastic was removed from the eye with the irrigation-aspiration handpiece. The wounds were hydrated. The anterior chamber was flushed with Miostat and the eye was inflated to physiologic pressure. 0.2 mL of Vigamox diluted three/one with BSS was placed in the anterior chamber. The wounds were found to be water tight. The eye was dressed with Vigamox. The patient was given protective  glasses to wear throughout the day and a shield with which to sleep tonight. The patient was also given drops with which to begin a drop regimen today and will follow-up with me in one day.  Implant Name Type Inv. Item Serial No. Manufacturer Lot No. LRB No. Used  LENS IOL TECNIS 14.5 - [id number] Intraocular Lens LENS IOL TECNIS 14.5 [id number] AMO   Left 1    Procedure(s) with comments: CATARACT EXTRACTION PHACO AND INTRAOCULAR LENS PLACEMENT (IOC) (Left) - [place] 00:51 ap 33.0 cde 10.89  Electronically signed: [place] 03/23/2015 9:13 AM

## 2015-03-23 NOTE — Transfer of Care (Signed)
Immediate Anesthesia Transfer of Care Note  Patient: [name]  Procedure(s) Performed: Procedure(s) with comments: CATARACT EXTRACTION PHACO AND INTRAOCULAR LENS PLACEMENT (IOC) (Left) - [place] 00:51 ap 33.0 cde 10.89  Patient Location: PACU and Short Stay  Anesthesia Type:MAC  Level of Consciousness: awake, alert  and oriented  Airway & Oxygen Therapy: Patient Spontanous Breathing  Post-op Assessment: Report given to RN and Post -op Vital signs reviewed and stable  Post vital signs: Reviewed and stable  Last Vitals:  Filed Vitals:   03/23/15 0918  BP: 151/69  Pulse: 72  Temp:   Resp: 16    Complications: No apparent anesthesia complications

## 2015-03-23 NOTE — H&P (Signed)
  All labs reviewed. Abnormal studies sent to patients PCP when indicated.  Previous H&P reviewed, patient examined, there are NO CHANGES.  [name] LOUIS10/11/20168:42 AM

## 2015-03-23 NOTE — Anesthesia Postprocedure Evaluation (Signed)
  Anesthesia Post-op Note  Patient: [name]  Procedure(s) Performed: Procedure(s) with comments: CATARACT EXTRACTION PHACO AND INTRAOCULAR LENS PLACEMENT (IOC) (Left) - [place] 00:51 ap 33.0 cde 10.89  Anesthesia type:MAC  Patient location: PACU  Post pain: Pain level controlled  Post assessment: Post-op Vital signs reviewed, Patient's Cardiovascular Status Stable, Respiratory Function Stable, Patent Airway and No signs of Nausea or vomiting  Post vital signs: Reviewed and stable  Last Vitals:  Filed Vitals:   03/23/15 0918  BP: 151/69  Pulse: 72  Temp:   Resp: 16    Level of consciousness: awake, alert  and patient cooperative  Complications: No apparent anesthesia complications

## 2015-03-23 NOTE — Discharge Instructions (Signed)
AMBULATORY SURGERY  DISCHARGE INSTRUCTIONS   1) The drugs that you were given will stay in your system until tomorrow so for the next 24 hours you should not:  A) Drive an automobile B) Make any legal decisions C) Drink any alcoholic beverage   2) You may resume regular meals tomorrow.  Today it is better to start with liquids and gradually work up to solid foods.  You may eat anything you prefer, but it is better to start with liquids, then soup and crackers, and gradually work up to solid foods.   Please notify your doctor immediately if you have any unusual bleeding, trouble breathing, redness and pain at the surgery site, drainage, fever, or pain not relieved by medication.   Eye Surgery Discharge Instructions  Expect mild scratchy sensation or mild soreness. DO NOT RUB YOUR EYE! The day of surgery: Minimal physical activity, but bed rest is not required No reading, computer work, or close hand work No bending, lifting, or straining. May watch TV  For 24 hours: No driving, legal decisions, or alcoholic beverages Safety precautions Eat anything you prefer: It is better to start with liquids, then soup then solid foods. _____ Eye patch should be worn until postoperative exam tomorrow. ____ Solar shield eyeglasses should be worn for comfort in the sunlight/patch while sleeping  Resume all regular medications including aspirin or Coumadin if these were discontinued prior to surgery. You may shower, bathe, shave, or wash your hair. Tylenol may be taken for mild discomfort.  Call your doctor if you experience significant pain, nausea, or vomiting, fever > 101 or other signs of infection. [phone] or [phone] Specific instructions:  Follow-up Information    Follow up with Tim Lair, MD. Go on 03/24/2015.   Specialty:  Ophthalmology   Why:  For wound re-check.  Appointment time is 10:00 a.m.   Contact information:   [place]  [phone]

## 2015-03-25 ENCOUNTER — Encounter: Payer: Self-pay | Admitting: *Deleted

## 2015-03-25 ENCOUNTER — Inpatient Hospital Stay
Admission: EM | Admit: 2015-03-25 | Discharge: 2015-04-01 | DRG: 469 | Disposition: A | Discharge status: Skilled Nursing Facility | Payer: Medicare Other | Attending: Internal Medicine | Admitting: Internal Medicine

## 2015-03-25 ENCOUNTER — Emergency Department: Payer: Medicare Other

## 2015-03-25 ENCOUNTER — Other Ambulatory Visit: Payer: Self-pay | Admitting: Emergency Medicine

## 2015-03-25 DIAGNOSIS — Z8041 Family history of malignant neoplasm of ovary: Secondary | ICD-10-CM

## 2015-03-25 DIAGNOSIS — R000 Tachycardia, unspecified: Secondary | ICD-10-CM | POA: Diagnosis not present

## 2015-03-25 DIAGNOSIS — M329 Systemic lupus erythematosus, unspecified: Secondary | ICD-10-CM | POA: Diagnosis present

## 2015-03-25 DIAGNOSIS — E119 Type 2 diabetes mellitus without complications: Secondary | ICD-10-CM | POA: Diagnosis present

## 2015-03-25 DIAGNOSIS — Z886 Allergy status to analgesic agent status: Secondary | ICD-10-CM

## 2015-03-25 DIAGNOSIS — W19XXXA Unspecified fall, initial encounter: Secondary | ICD-10-CM

## 2015-03-25 DIAGNOSIS — R1310 Dysphagia, unspecified: Secondary | ICD-10-CM | POA: Diagnosis present

## 2015-03-25 DIAGNOSIS — J439 Emphysema, unspecified: Secondary | ICD-10-CM | POA: Diagnosis not present

## 2015-03-25 DIAGNOSIS — S299XXA Unspecified injury of thorax, initial encounter: Secondary | ICD-10-CM | POA: Diagnosis not present

## 2015-03-25 DIAGNOSIS — E039 Hypothyroidism, unspecified: Secondary | ICD-10-CM | POA: Diagnosis present

## 2015-03-25 DIAGNOSIS — Z880 Allergy status to penicillin: Secondary | ICD-10-CM

## 2015-03-25 DIAGNOSIS — W010XXA Fall on same level from slipping, tripping and stumbling without subsequent striking against object, initial encounter: Secondary | ICD-10-CM | POA: Diagnosis present

## 2015-03-25 DIAGNOSIS — Z833 Family history of diabetes mellitus: Secondary | ICD-10-CM

## 2015-03-25 DIAGNOSIS — S72012A Unspecified intracapsular fracture of left femur, initial encounter for closed fracture: Principal | ICD-10-CM | POA: Diagnosis present

## 2015-03-25 DIAGNOSIS — I5031 Acute diastolic (congestive) heart failure: Secondary | ICD-10-CM

## 2015-03-25 DIAGNOSIS — W1830XA Fall on same level, unspecified, initial encounter: Secondary | ICD-10-CM | POA: Diagnosis not present

## 2015-03-25 DIAGNOSIS — F419 Anxiety disorder, unspecified: Secondary | ICD-10-CM | POA: Diagnosis present

## 2015-03-25 DIAGNOSIS — Z961 Presence of intraocular lens: Secondary | ICD-10-CM | POA: Diagnosis present

## 2015-03-25 DIAGNOSIS — M1990 Unspecified osteoarthritis, unspecified site: Secondary | ICD-10-CM | POA: Diagnosis present

## 2015-03-25 DIAGNOSIS — Z9842 Cataract extraction status, left eye: Secondary | ICD-10-CM

## 2015-03-25 DIAGNOSIS — K219 Gastro-esophageal reflux disease without esophagitis: Secondary | ICD-10-CM | POA: Diagnosis present

## 2015-03-25 DIAGNOSIS — I7300 Raynaud's syndrome without gangrene: Secondary | ICD-10-CM | POA: Diagnosis present

## 2015-03-25 DIAGNOSIS — I493 Ventricular premature depolarization: Secondary | ICD-10-CM | POA: Diagnosis present

## 2015-03-25 DIAGNOSIS — K589 Irritable bowel syndrome without diarrhea: Secondary | ICD-10-CM | POA: Diagnosis present

## 2015-03-25 DIAGNOSIS — H2512 Age-related nuclear cataract, left eye: Secondary | ICD-10-CM | POA: Diagnosis present

## 2015-03-25 DIAGNOSIS — I4891 Unspecified atrial fibrillation: Secondary | ICD-10-CM

## 2015-03-25 DIAGNOSIS — Z882 Allergy status to sulfonamides status: Secondary | ICD-10-CM

## 2015-03-25 DIAGNOSIS — S72092A Other fracture of head and neck of left femur, initial encounter for closed fracture: Secondary | ICD-10-CM | POA: Diagnosis not present

## 2015-03-25 DIAGNOSIS — Z803 Family history of malignant neoplasm of breast: Secondary | ICD-10-CM

## 2015-03-25 DIAGNOSIS — J449 Chronic obstructive pulmonary disease, unspecified: Secondary | ICD-10-CM | POA: Diagnosis present

## 2015-03-25 DIAGNOSIS — Z87891 Personal history of nicotine dependence: Secondary | ICD-10-CM

## 2015-03-25 DIAGNOSIS — Z8249 Family history of ischemic heart disease and other diseases of the circulatory system: Secondary | ICD-10-CM

## 2015-03-25 DIAGNOSIS — Z7901 Long term (current) use of anticoagulants: Secondary | ICD-10-CM

## 2015-03-25 DIAGNOSIS — Z79899 Other long term (current) drug therapy: Secondary | ICD-10-CM

## 2015-03-25 DIAGNOSIS — Z8711 Personal history of peptic ulcer disease: Secondary | ICD-10-CM

## 2015-03-25 DIAGNOSIS — I5033 Acute on chronic diastolic (congestive) heart failure: Secondary | ICD-10-CM | POA: Diagnosis present

## 2015-03-25 DIAGNOSIS — Z90722 Acquired absence of ovaries, bilateral: Secondary | ICD-10-CM

## 2015-03-25 DIAGNOSIS — J9610 Chronic respiratory failure, unspecified whether with hypoxia or hypercapnia: Secondary | ICD-10-CM | POA: Diagnosis present

## 2015-03-25 DIAGNOSIS — B370 Candidal stomatitis: Secondary | ICD-10-CM | POA: Diagnosis not present

## 2015-03-25 DIAGNOSIS — I341 Nonrheumatic mitral (valve) prolapse: Secondary | ICD-10-CM | POA: Diagnosis present

## 2015-03-25 DIAGNOSIS — Y92012 Bathroom of single-family (private) house as the place of occurrence of the external cause: Secondary | ICD-10-CM

## 2015-03-25 DIAGNOSIS — M25552 Pain in left hip: Secondary | ICD-10-CM | POA: Diagnosis not present

## 2015-03-25 DIAGNOSIS — Z87442 Personal history of urinary calculi: Secondary | ICD-10-CM

## 2015-03-25 DIAGNOSIS — S72002A Fracture of unspecified part of neck of left femur, initial encounter for closed fracture: Secondary | ICD-10-CM

## 2015-03-25 DIAGNOSIS — M459 Ankylosing spondylitis of unspecified sites in spine: Secondary | ICD-10-CM | POA: Diagnosis present

## 2015-03-25 DIAGNOSIS — Z96649 Presence of unspecified artificial hip joint: Secondary | ICD-10-CM

## 2015-03-25 DIAGNOSIS — Z91040 Latex allergy status: Secondary | ICD-10-CM

## 2015-03-25 DIAGNOSIS — I480 Paroxysmal atrial fibrillation: Secondary | ICD-10-CM | POA: Diagnosis present

## 2015-03-25 DIAGNOSIS — Z9889 Other specified postprocedural states: Secondary | ICD-10-CM

## 2015-03-25 DIAGNOSIS — Z9981 Dependence on supplemental oxygen: Secondary | ICD-10-CM

## 2015-03-25 LAB — COMPREHENSIVE METABOLIC PANEL
ALBUMIN: 4 g/dL (ref 3.5–5.0)
ALT: 17 U/L (ref 14–54)
AST: 20 U/L (ref 15–41)
Alkaline Phosphatase: 67 U/L (ref 38–126)
Anion gap: 6 (ref 5–15)
BUN: 16 mg/dL (ref 6–20)
CHLORIDE: 103 mmol/L (ref 101–111)
CO2: 33 mmol/L — ABNORMAL HIGH (ref 22–32)
CREATININE: 0.78 mg/dL (ref 0.44–1.00)
Calcium: 9.3 mg/dL (ref 8.9–10.3)
GFR calc Af Amer: >60 mL/min (ref >60)
GLUCOSE: 151 mg/dL — AB (ref 65–99)
Potassium: 4 mmol/L (ref 3.5–5.1)
SODIUM: 142 mmol/L (ref 135–145)
Total Bilirubin: 0.4 mg/dL (ref 0.3–1.2)
Total Protein: 6.8 g/dL (ref 6.5–8.1)

## 2015-03-25 LAB — CBC WITH DIFFERENTIAL/PLATELET
Basophils Absolute: 0 10*3/uL (ref 0–0.1)
Basophils Relative: 0 %
EOS ABS: 0.1 10*3/uL (ref 0–0.7)
Eosinophils Relative: 1 %
HEMATOCRIT: 35.7 % (ref 35.0–47.0)
HEMOGLOBIN: 12.1 g/dL (ref 12.0–16.0)
LYMPHS ABS: 1.1 10*3/uL (ref 1.0–3.6)
LYMPHS PCT: 11 %
MCH: 31.4 pg (ref 26.0–34.0)
MCHC: 33.8 g/dL (ref 32.0–36.0)
MCV: 92.9 fL (ref 80.0–100.0)
MONOS PCT: 7 %
Monocytes Absolute: 0.7 10*3/uL (ref 0.2–0.9)
NEUTROS ABS: 7.9 10*3/uL — AB (ref 1.4–6.5)
NEUTROS PCT: 81 %
Platelets: 163 10*3/uL (ref 150–440)
RBC: 3.84 MIL/uL (ref 3.80–5.20)
RDW: 12.9 % (ref 11.5–14.5)
WBC: 9.9 10*3/uL (ref 3.6–11.0)

## 2015-03-25 MED ORDER — MORPHINE SULFATE (PF) 4 MG/ML IV SOLN
4.0000 mg | Freq: Once | INTRAVENOUS | Status: AC
Start: 2015-03-25 — End: 2015-03-25
  Administered 2015-03-25: 4 mg via INTRAVENOUS
  Filled 2015-03-25: qty 1

## 2015-03-25 MED ORDER — ONDANSETRON HCL 4 MG/2ML IJ SOLN
INTRAMUSCULAR | Status: AC
  Administered 2015-03-25: 4 mg via INTRAVENOUS
  Filled 2015-03-25: qty 2

## 2015-03-25 MED ORDER — ONDANSETRON HCL 4 MG/2ML IJ SOLN
4.0000 mg | Freq: Once | INTRAMUSCULAR | Status: AC
  Administered 2015-03-25: 4 mg via INTRAVENOUS

## 2015-03-25 NOTE — ED Notes (Addendum)
Pt arrived to ED via EMS after a fall at home after tripping over oxygen tubing. Pt reporting pain in left hip. Left leg is shorter than right and turned outward. Pt reports increased pain upon palpation of left hip. Pt denies LOC or hitting head. Pt alert and oriented. EMS administered 71mcg of fentanyl in route. Pt arrived nauseous and vomtting.

## 2015-03-26 ENCOUNTER — Inpatient Hospital Stay: Payer: Medicare Other | Admitting: Anesthesiology

## 2015-03-26 ENCOUNTER — Encounter: Payer: Self-pay | Admitting: Internal Medicine

## 2015-03-26 ENCOUNTER — Encounter
Admission: EM | Disposition: A | Discharge status: Skilled Nursing Facility | Payer: Self-pay | Source: Home / Self Care | Attending: Internal Medicine

## 2015-03-26 ENCOUNTER — Inpatient Hospital Stay: Payer: Medicare Other

## 2015-03-26 DIAGNOSIS — Z803 Family history of malignant neoplasm of breast: Secondary | ICD-10-CM | POA: Diagnosis not present

## 2015-03-26 DIAGNOSIS — F419 Anxiety disorder, unspecified: Secondary | ICD-10-CM | POA: Diagnosis present

## 2015-03-26 DIAGNOSIS — H2512 Age-related nuclear cataract, left eye: Secondary | ICD-10-CM | POA: Diagnosis present

## 2015-03-26 DIAGNOSIS — M329 Systemic lupus erythematosus, unspecified: Secondary | ICD-10-CM | POA: Diagnosis present

## 2015-03-26 DIAGNOSIS — S72032A Displaced midcervical fracture of left femur, initial encounter for closed fracture: Secondary | ICD-10-CM | POA: Diagnosis not present

## 2015-03-26 DIAGNOSIS — I480 Paroxysmal atrial fibrillation: Secondary | ICD-10-CM | POA: Diagnosis not present

## 2015-03-26 DIAGNOSIS — Z9889 Other specified postprocedural states: Secondary | ICD-10-CM | POA: Diagnosis not present

## 2015-03-26 DIAGNOSIS — Z87442 Personal history of urinary calculi: Secondary | ICD-10-CM | POA: Diagnosis not present

## 2015-03-26 DIAGNOSIS — S72012A Unspecified intracapsular fracture of left femur, initial encounter for closed fracture: Secondary | ICD-10-CM | POA: Diagnosis not present

## 2015-03-26 DIAGNOSIS — Z741 Need for assistance with personal care: Secondary | ICD-10-CM | POA: Diagnosis not present

## 2015-03-26 DIAGNOSIS — K589 Irritable bowel syndrome without diarrhea: Secondary | ICD-10-CM | POA: Diagnosis present

## 2015-03-26 DIAGNOSIS — R262 Difficulty in walking, not elsewhere classified: Secondary | ICD-10-CM | POA: Diagnosis not present

## 2015-03-26 DIAGNOSIS — M459 Ankylosing spondylitis of unspecified sites in spine: Secondary | ICD-10-CM | POA: Diagnosis present

## 2015-03-26 DIAGNOSIS — S72092A Other fracture of head and neck of left femur, initial encounter for closed fracture: Secondary | ICD-10-CM | POA: Diagnosis not present

## 2015-03-26 DIAGNOSIS — S72002A Fracture of unspecified part of neck of left femur, initial encounter for closed fracture: Secondary | ICD-10-CM | POA: Diagnosis not present

## 2015-03-26 DIAGNOSIS — B370 Candidal stomatitis: Secondary | ICD-10-CM | POA: Diagnosis not present

## 2015-03-26 DIAGNOSIS — I5031 Acute diastolic (congestive) heart failure: Secondary | ICD-10-CM | POA: Diagnosis not present

## 2015-03-26 DIAGNOSIS — R1310 Dysphagia, unspecified: Secondary | ICD-10-CM | POA: Diagnosis present

## 2015-03-26 DIAGNOSIS — J9610 Chronic respiratory failure, unspecified whether with hypoxia or hypercapnia: Secondary | ICD-10-CM | POA: Diagnosis present

## 2015-03-26 DIAGNOSIS — Z961 Presence of intraocular lens: Secondary | ICD-10-CM | POA: Diagnosis present

## 2015-03-26 DIAGNOSIS — K219 Gastro-esophageal reflux disease without esophagitis: Secondary | ICD-10-CM | POA: Diagnosis present

## 2015-03-26 DIAGNOSIS — E039 Hypothyroidism, unspecified: Secondary | ICD-10-CM | POA: Diagnosis present

## 2015-03-26 DIAGNOSIS — I739 Peripheral vascular disease, unspecified: Secondary | ICD-10-CM | POA: Diagnosis not present

## 2015-03-26 DIAGNOSIS — Z471 Aftercare following joint replacement surgery: Secondary | ICD-10-CM | POA: Diagnosis not present

## 2015-03-26 DIAGNOSIS — Z8041 Family history of malignant neoplasm of ovary: Secondary | ICD-10-CM | POA: Diagnosis not present

## 2015-03-26 DIAGNOSIS — Z9660 Presence of unspecified orthopedic joint implant: Secondary | ICD-10-CM | POA: Diagnosis not present

## 2015-03-26 DIAGNOSIS — I952 Hypotension due to drugs: Secondary | ICD-10-CM | POA: Diagnosis not present

## 2015-03-26 DIAGNOSIS — M25552 Pain in left hip: Secondary | ICD-10-CM | POA: Diagnosis not present

## 2015-03-26 DIAGNOSIS — I4891 Unspecified atrial fibrillation: Secondary | ICD-10-CM | POA: Diagnosis not present

## 2015-03-26 DIAGNOSIS — M6281 Muscle weakness (generalized): Secondary | ICD-10-CM | POA: Diagnosis not present

## 2015-03-26 DIAGNOSIS — Z7901 Long term (current) use of anticoagulants: Secondary | ICD-10-CM | POA: Diagnosis not present

## 2015-03-26 DIAGNOSIS — Z8711 Personal history of peptic ulcer disease: Secondary | ICD-10-CM | POA: Diagnosis not present

## 2015-03-26 DIAGNOSIS — Z882 Allergy status to sulfonamides status: Secondary | ICD-10-CM | POA: Diagnosis not present

## 2015-03-26 DIAGNOSIS — Z9981 Dependence on supplemental oxygen: Secondary | ICD-10-CM | POA: Diagnosis not present

## 2015-03-26 DIAGNOSIS — E118 Type 2 diabetes mellitus with unspecified complications: Secondary | ICD-10-CM | POA: Diagnosis not present

## 2015-03-26 DIAGNOSIS — Z9842 Cataract extraction status, left eye: Secondary | ICD-10-CM | POA: Diagnosis not present

## 2015-03-26 DIAGNOSIS — M1990 Unspecified osteoarthritis, unspecified site: Secondary | ICD-10-CM | POA: Diagnosis present

## 2015-03-26 DIAGNOSIS — Z886 Allergy status to analgesic agent status: Secondary | ICD-10-CM | POA: Diagnosis not present

## 2015-03-26 DIAGNOSIS — Z8249 Family history of ischemic heart disease and other diseases of the circulatory system: Secondary | ICD-10-CM | POA: Diagnosis not present

## 2015-03-26 DIAGNOSIS — Z96642 Presence of left artificial hip joint: Secondary | ICD-10-CM | POA: Diagnosis not present

## 2015-03-26 DIAGNOSIS — G8918 Other acute postprocedural pain: Secondary | ICD-10-CM | POA: Diagnosis not present

## 2015-03-26 DIAGNOSIS — Z79899 Other long term (current) drug therapy: Secondary | ICD-10-CM | POA: Diagnosis not present

## 2015-03-26 DIAGNOSIS — S72002D Fracture of unspecified part of neck of left femur, subsequent encounter for closed fracture with routine healing: Secondary | ICD-10-CM | POA: Diagnosis not present

## 2015-03-26 DIAGNOSIS — Z90722 Acquired absence of ovaries, bilateral: Secondary | ICD-10-CM | POA: Diagnosis not present

## 2015-03-26 DIAGNOSIS — I7300 Raynaud's syndrome without gangrene: Secondary | ICD-10-CM | POA: Diagnosis present

## 2015-03-26 DIAGNOSIS — Z880 Allergy status to penicillin: Secondary | ICD-10-CM | POA: Diagnosis not present

## 2015-03-26 DIAGNOSIS — I493 Ventricular premature depolarization: Secondary | ICD-10-CM | POA: Diagnosis present

## 2015-03-26 DIAGNOSIS — J439 Emphysema, unspecified: Secondary | ICD-10-CM | POA: Diagnosis not present

## 2015-03-26 DIAGNOSIS — Z833 Family history of diabetes mellitus: Secondary | ICD-10-CM | POA: Diagnosis not present

## 2015-03-26 DIAGNOSIS — J449 Chronic obstructive pulmonary disease, unspecified: Secondary | ICD-10-CM | POA: Diagnosis not present

## 2015-03-26 DIAGNOSIS — I341 Nonrheumatic mitral (valve) prolapse: Secondary | ICD-10-CM | POA: Diagnosis present

## 2015-03-26 DIAGNOSIS — R52 Pain, unspecified: Secondary | ICD-10-CM | POA: Diagnosis not present

## 2015-03-26 DIAGNOSIS — W010XXA Fall on same level from slipping, tripping and stumbling without subsequent striking against object, initial encounter: Secondary | ICD-10-CM | POA: Diagnosis present

## 2015-03-26 DIAGNOSIS — E119 Type 2 diabetes mellitus without complications: Secondary | ICD-10-CM | POA: Diagnosis not present

## 2015-03-26 DIAGNOSIS — R000 Tachycardia, unspecified: Secondary | ICD-10-CM | POA: Diagnosis present

## 2015-03-26 DIAGNOSIS — I499 Cardiac arrhythmia, unspecified: Secondary | ICD-10-CM | POA: Diagnosis not present

## 2015-03-26 DIAGNOSIS — I5033 Acute on chronic diastolic (congestive) heart failure: Secondary | ICD-10-CM | POA: Diagnosis present

## 2015-03-26 DIAGNOSIS — Z91040 Latex allergy status: Secondary | ICD-10-CM | POA: Diagnosis not present

## 2015-03-26 DIAGNOSIS — S299XXA Unspecified injury of thorax, initial encounter: Secondary | ICD-10-CM | POA: Diagnosis not present

## 2015-03-26 DIAGNOSIS — Z87891 Personal history of nicotine dependence: Secondary | ICD-10-CM | POA: Diagnosis not present

## 2015-03-26 DIAGNOSIS — Y92012 Bathroom of single-family (private) house as the place of occurrence of the external cause: Secondary | ICD-10-CM | POA: Diagnosis not present

## 2015-03-26 HISTORY — PX: HIP ARTHROPLASTY: SHX981

## 2015-03-26 LAB — BASIC METABOLIC PANEL
ANION GAP: 5 (ref 5–15)
BUN: 16 mg/dL (ref 6–20)
CALCIUM: 8.7 mg/dL — AB (ref 8.9–10.3)
CHLORIDE: 103 mmol/L (ref 101–111)
CO2: 32 mmol/L (ref 22–32)
CREATININE: 0.7 mg/dL (ref 0.44–1.00)
GFR calc Af Amer: >60 mL/min (ref >60)
GFR calc non Af Amer: >60 mL/min (ref >60)
Glucose, Bld: 190 mg/dL — ABNORMAL HIGH (ref 65–99)
Potassium: 4.2 mmol/L (ref 3.5–5.1)
SODIUM: 140 mmol/L (ref 135–145)

## 2015-03-26 LAB — URINALYSIS COMPLETE WITH MICROSCOPIC (ARMC ONLY)
BACTERIA UA: NONE SEEN
BILIRUBIN URINE: NEGATIVE
Glucose, UA: NEGATIVE mg/dL
Hgb urine dipstick: NEGATIVE
NITRITE: NEGATIVE
PH: 7 (ref 5.0–8.0)
Protein, ur: NEGATIVE mg/dL
SPECIFIC GRAVITY, URINE: 1.014 (ref 1.005–1.030)
SQUAMOUS EPITHELIAL / LPF: NONE SEEN

## 2015-03-26 LAB — PROTIME-INR
INR: 1.34
PROTHROMBIN TIME: 16.8 s — AB (ref 11.4–15.0)

## 2015-03-26 LAB — TYPE AND SCREEN
ABO/RH(D): O POS
Antibody Screen: NEGATIVE

## 2015-03-26 LAB — CBC
HCT: 34.3 % — ABNORMAL LOW (ref 35.0–47.0)
Hemoglobin: 11.4 g/dL — ABNORMAL LOW (ref 12.0–16.0)
MCH: 30.9 pg (ref 26.0–34.0)
MCHC: 33.3 g/dL (ref 32.0–36.0)
MCV: 93 fL (ref 80.0–100.0)
PLATELETS: 152 10*3/uL (ref 150–440)
RBC: 3.69 MIL/uL — AB (ref 3.80–5.20)
RDW: 12.7 % (ref 11.5–14.5)
WBC: 8.5 10*3/uL (ref 3.6–11.0)

## 2015-03-26 LAB — GLUCOSE, CAPILLARY
Glucose-Capillary: 185 mg/dL — ABNORMAL HIGH (ref 65–99)
Glucose-Capillary: 114 mg/dL — ABNORMAL HIGH (ref 65–99)
Glucose-Capillary: 223 mg/dL — ABNORMAL HIGH (ref 65–99)

## 2015-03-26 LAB — APTT: APTT: 32 s (ref 24–36)

## 2015-03-26 LAB — SURGICAL PCR SCREEN
MRSA, PCR: NEGATIVE
Staphylococcus aureus: NEGATIVE

## 2015-03-26 SURGERY — HEMIARTHROPLASTY, HIP, DIRECT ANTERIOR APPROACH, FOR FRACTURE
Anesthesia: Spinal | Site: Hip | Laterality: Left | Wound class: Clean

## 2015-03-26 MED ORDER — SODIUM CHLORIDE 0.45 % IV SOLN
INTRAVENOUS | Status: DC
  Administered 2015-03-26: 03:00:00 via INTRAVENOUS

## 2015-03-26 MED ORDER — ENOXAPARIN SODIUM 30 MG/0.3ML ~~LOC~~ SOLN
30.0000 mg | Freq: Two times a day (BID) | SUBCUTANEOUS | Status: DC
  Administered 2015-03-27 – 2015-03-29 (×5): 30 mg via SUBCUTANEOUS
  Filled 2015-03-26 (×5): qty 0.3

## 2015-03-26 MED ORDER — POVIDONE-IODINE 7.5 % EX SOLN: Freq: Once | CUTANEOUS | Status: DC

## 2015-03-26 MED ORDER — MORPHINE SULFATE (PF) 4 MG/ML IV SOLN
4.0000 mg | Freq: Once | INTRAVENOUS | Status: AC
  Administered 2015-03-26: 4 mg via INTRAVENOUS
  Filled 2015-03-26: qty 1

## 2015-03-26 MED ORDER — ACETAMINOPHEN 650 MG RE SUPP: 650.0000 mg | Freq: Four times a day (QID) | RECTAL | Status: DC | PRN

## 2015-03-26 MED ORDER — MORPHINE SULFATE (PF) 2 MG/ML IV SOLN
0.5000 mg | INTRAVENOUS | Status: DC | PRN
  Administered 2015-03-26: 0.5 mg via INTRAVENOUS
  Filled 2015-03-26: qty 1

## 2015-03-26 MED ORDER — ZOLPIDEM TARTRATE 5 MG PO TABS
5.0000 mg | ORAL_TABLET | Freq: Every evening | ORAL | Status: DC | PRN
  Administered 2015-03-27 – 2015-03-30 (×4): 5 mg via ORAL
  Filled 2015-03-26 (×5): qty 1

## 2015-03-26 MED ORDER — MIDAZOLAM HCL 5 MG/5ML IJ SOLN
INTRAMUSCULAR | Status: DC | PRN
  Administered 2015-03-26: 2 mg via INTRAVENOUS

## 2015-03-26 MED ORDER — PROPOFOL 500 MG/50ML IV EMUL
INTRAVENOUS | Status: DC | PRN
  Administered 2015-03-26: 40 ug/kg/min via INTRAVENOUS

## 2015-03-26 MED ORDER — SODIUM CHLORIDE 0.9 % IV SOLN: INTRAVENOUS | Status: DC

## 2015-03-26 MED ORDER — METHOCARBAMOL 1000 MG/10ML IJ SOLN
500.0000 mg | Freq: Four times a day (QID) | INTRAVENOUS | Status: DC | PRN
  Filled 2015-03-26: qty 5

## 2015-03-26 MED ORDER — FERROUS SULFATE 325 (65 FE) MG PO TABS
325.0000 mg | ORAL_TABLET | Freq: Every day | ORAL | Status: DC
  Administered 2015-03-27 – 2015-04-01 (×6): 325 mg via ORAL
  Filled 2015-03-26 (×6): qty 1

## 2015-03-26 MED ORDER — BISACODYL 10 MG RE SUPP
10.0000 mg | Freq: Every day | RECTAL | Status: DC | PRN
  Administered 2015-03-28 – 2015-03-31 (×2): 10 mg via RECTAL
  Filled 2015-03-26 (×2): qty 1

## 2015-03-26 MED ORDER — SENNA 8.6 MG PO TABS
1.0000 | ORAL_TABLET | Freq: Two times a day (BID) | ORAL | Status: DC
  Filled 2015-03-26 (×2): qty 1

## 2015-03-26 MED ORDER — CEFAZOLIN SODIUM-DEXTROSE 2-3 GM-% IV SOLR
2.0000 g | INTRAVENOUS | Status: AC
  Administered 2015-03-26: 2 g via INTRAVENOUS
  Filled 2015-03-26: qty 50

## 2015-03-26 MED ORDER — PANTOPRAZOLE SODIUM 40 MG PO TBEC
40.0000 mg | DELAYED_RELEASE_TABLET | Freq: Every day | ORAL | Status: DC
  Administered 2015-03-26: 40 mg via ORAL
  Filled 2015-03-26: qty 1

## 2015-03-26 MED ORDER — ONDANSETRON HCL 4 MG/2ML IJ SOLN
4.0000 mg | Freq: Once | INTRAMUSCULAR | Status: AC
  Administered 2015-03-26: 4 mg via INTRAVENOUS

## 2015-03-26 MED ORDER — MORPHINE SULFATE (PF) 2 MG/ML IV SOLN
0.5000 mg | INTRAVENOUS | Status: DC | PRN
  Administered 2015-03-27: 0.5 mg via INTRAVENOUS
  Filled 2015-03-26: qty 1

## 2015-03-26 MED ORDER — CLINDAMYCIN PHOSPHATE 600 MG/50ML IV SOLN
600.0000 mg | Freq: Three times a day (TID) | INTRAVENOUS | Status: AC
  Administered 2015-03-26 – 2015-03-27 (×3): 600 mg via INTRAVENOUS
  Filled 2015-03-26 (×3): qty 50

## 2015-03-26 MED ORDER — ONDANSETRON HCL 4 MG/2ML IJ SOLN: 4.0000 mg | Freq: Four times a day (QID) | INTRAMUSCULAR | Status: DC | PRN

## 2015-03-26 MED ORDER — METOCLOPRAMIDE HCL 5 MG PO TABS
5.0000 mg | ORAL_TABLET | Freq: Three times a day (TID) | ORAL | Status: DC | PRN
  Filled 2015-03-26: qty 2

## 2015-03-26 MED ORDER — ENOXAPARIN SODIUM 30 MG/0.3ML ~~LOC~~ SOLN: 30.0000 mg | Freq: Two times a day (BID) | SUBCUTANEOUS | Status: DC

## 2015-03-26 MED ORDER — SODIUM CHLORIDE 0.45 % IV SOLN
INTRAVENOUS | Status: DC
  Administered 2015-03-26 – 2015-03-30 (×4): via INTRAVENOUS

## 2015-03-26 MED ORDER — FLEET ENEMA 7-19 GM/118ML RE ENEM
1.0000 | ENEMA | Freq: Once | RECTAL | Status: AC | PRN
  Administered 2015-03-29: 1 via RECTAL

## 2015-03-26 MED ORDER — METHOCARBAMOL 500 MG PO TABS
500.0000 mg | ORAL_TABLET | Freq: Four times a day (QID) | ORAL | Status: DC | PRN
  Filled 2015-03-26 (×3): qty 1

## 2015-03-26 MED ORDER — METHOCARBAMOL 500 MG PO TABS: 500.0000 mg | ORAL_TABLET | Freq: Four times a day (QID) | ORAL | Status: DC | PRN

## 2015-03-26 MED ORDER — KETAMINE HCL 50 MG/ML IJ SOLN
INTRAMUSCULAR | Status: DC | PRN
  Administered 2015-03-26 (×2): 25 mg via INTRAMUSCULAR

## 2015-03-26 MED ORDER — PHENOL 1.4 % MT LIQD: 1.0000 | OROMUCOSAL | Status: DC | PRN

## 2015-03-26 MED ORDER — BUPIVACAINE HCL (PF) 0.5 % IJ SOLN
INTRAMUSCULAR | Status: DC | PRN
  Administered 2015-03-26: 3 mL

## 2015-03-26 MED ORDER — PROMETHAZINE HCL 25 MG/ML IJ SOLN
12.5000 mg | Freq: Four times a day (QID) | INTRAMUSCULAR | Status: DC | PRN
  Administered 2015-03-26: 12.5 mg via INTRAVENOUS
  Filled 2015-03-26: qty 1

## 2015-03-26 MED ORDER — ONDANSETRON HCL 4 MG PO TABS: 4.0000 mg | ORAL_TABLET | Freq: Four times a day (QID) | ORAL | Status: DC | PRN

## 2015-03-26 MED ORDER — ALBUTEROL SULFATE (2.5 MG/3ML) 0.083% IN NEBU: 2.5000 mg | INHALATION_SOLUTION | Freq: Four times a day (QID) | RESPIRATORY_TRACT | Status: DC | PRN

## 2015-03-26 MED ORDER — HYDROCODONE-ACETAMINOPHEN 5-325 MG PO TABS: 1.0000 | ORAL_TABLET | Freq: Four times a day (QID) | ORAL | Status: DC | PRN

## 2015-03-26 MED ORDER — SODIUM CHLORIDE 0.9 % IV SOLN
INTRAVENOUS | Status: DC | PRN
  Administered 2015-03-26: 13:00:00 via INTRAVENOUS

## 2015-03-26 MED ORDER — CLINDAMYCIN PHOSPHATE 600 MG/50ML IV SOLN
INTRAVENOUS | Status: DC | PRN
  Administered 2015-03-26: 600 mg via INTRAVENOUS

## 2015-03-26 MED ORDER — MOXIFLOXACIN HCL 0.5 % OP SOLN
1.0000 [drp] | Freq: Four times a day (QID) | OPHTHALMIC | Status: DC
  Administered 2015-03-26 – 2015-03-29 (×12): 1 [drp] via OPHTHALMIC
  Filled 2015-03-26: qty 3

## 2015-03-26 MED ORDER — BUPIVACAINE-EPINEPHRINE (PF) 0.25% -1:200000 IJ SOLN
INTRAMUSCULAR | Status: DC | PRN
  Administered 2015-03-26: 30 mL via PERINEURAL

## 2015-03-26 MED ORDER — PHENYLEPHRINE HCL 10 MG/ML IJ SOLN
INTRAMUSCULAR | Status: DC | PRN
  Administered 2015-03-26 (×3): 100 ug via INTRAVENOUS

## 2015-03-26 MED ORDER — SENNA 8.6 MG PO TABS
1.0000 | ORAL_TABLET | Freq: Two times a day (BID) | ORAL | Status: DC
  Administered 2015-03-26 – 2015-04-01 (×10): 8.6 mg via ORAL
  Filled 2015-03-26 (×12): qty 1

## 2015-03-26 MED ORDER — METOCLOPRAMIDE HCL 5 MG/ML IJ SOLN: 5.0000 mg | Freq: Three times a day (TID) | INTRAMUSCULAR | Status: DC | PRN

## 2015-03-26 MED ORDER — INSULIN ASPART 100 UNIT/ML ~~LOC~~ SOLN
0.0000 [IU] | Freq: Four times a day (QID) | SUBCUTANEOUS | Status: DC
  Administered 2015-03-26: 2 [IU] via SUBCUTANEOUS
  Filled 2015-03-26: qty 2

## 2015-03-26 MED ORDER — TIOTROPIUM BROMIDE MONOHYDRATE 18 MCG IN CAPS
18.0000 ug | ORAL_CAPSULE | Freq: Every day | RESPIRATORY_TRACT | Status: DC
  Filled 2015-03-26: qty 5

## 2015-03-26 MED ORDER — ACETAMINOPHEN 325 MG PO TABS: 650.0000 mg | ORAL_TABLET | Freq: Four times a day (QID) | ORAL | Status: DC | PRN

## 2015-03-26 MED ORDER — CEFAZOLIN SODIUM-DEXTROSE 2-3 GM-% IV SOLR
2.0000 g | Freq: Four times a day (QID) | INTRAVENOUS | Status: AC
  Administered 2015-03-26 – 2015-03-27 (×2): 2 g via INTRAVENOUS
  Filled 2015-03-26 (×2): qty 50

## 2015-03-26 MED ORDER — MORPHINE SULFATE (PF) 4 MG/ML IV SOLN: 4.0000 mg | INTRAVENOUS | Status: DC | PRN

## 2015-03-26 MED ORDER — ONDANSETRON HCL 4 MG/2ML IJ SOLN
4.0000 mg | Freq: Four times a day (QID) | INTRAMUSCULAR | Status: DC | PRN
  Filled 2015-03-26: qty 2

## 2015-03-26 MED ORDER — SODIUM CHLORIDE 0.9 % IV BOLUS (SEPSIS)
500.0000 mL | Freq: Once | INTRAVENOUS | Status: AC
  Administered 2015-03-26: 500 mL via INTRAVENOUS

## 2015-03-26 MED ORDER — FENTANYL CITRATE (PF) 100 MCG/2ML IJ SOLN
25.0000 ug | INTRAMUSCULAR | Status: DC | PRN
  Administered 2015-03-26: 25 ug via INTRAVENOUS

## 2015-03-26 MED ORDER — ONDANSETRON HCL 4 MG/2ML IJ SOLN
4.0000 mg | Freq: Once | INTRAMUSCULAR | Status: AC | PRN
  Administered 2015-03-29: 4 mg via INTRAVENOUS

## 2015-03-26 MED ORDER — ACETAMINOPHEN 325 MG PO TABS
650.0000 mg | ORAL_TABLET | Freq: Four times a day (QID) | ORAL | Status: DC | PRN
  Administered 2015-03-27: 650 mg via ORAL
  Administered 2015-03-27: 325 mg via ORAL
  Filled 2015-03-26: qty 1
  Filled 2015-03-26 (×2): qty 2

## 2015-03-26 MED ORDER — LORAZEPAM 0.5 MG PO TABS: 0.5000 mg | ORAL_TABLET | Freq: Two times a day (BID) | ORAL | Status: DC | PRN

## 2015-03-26 MED ORDER — ZOLPIDEM TARTRATE 5 MG PO TABS: 5.0000 mg | ORAL_TABLET | Freq: Every evening | ORAL | Status: DC | PRN

## 2015-03-26 MED ORDER — HYDROCODONE-ACETAMINOPHEN 5-325 MG PO TABS
1.0000 | ORAL_TABLET | Freq: Four times a day (QID) | ORAL | Status: DC | PRN
  Administered 2015-03-26 – 2015-03-29 (×9): 1 via ORAL
  Administered 2015-03-30: 2 via ORAL
  Filled 2015-03-26 (×4): qty 1
  Filled 2015-03-26: qty 2
  Filled 2015-03-26 (×3): qty 1
  Filled 2015-03-26: qty 2
  Filled 2015-03-26: qty 1

## 2015-03-26 MED ORDER — CLINDAMYCIN PHOSPHATE 900 MG/50ML IV SOLN
900.0000 mg | INTRAVENOUS | Status: DC
  Filled 2015-03-26: qty 50

## 2015-03-26 MED ORDER — ALUM & MAG HYDROXIDE-SIMETH 200-200-20 MG/5ML PO SUSP
30.0000 mL | ORAL | Status: DC | PRN
  Administered 2015-03-30: 30 mL via ORAL
  Filled 2015-03-26: qty 30

## 2015-03-26 MED ORDER — MOMETASONE FURO-FORMOTEROL FUM 200-5 MCG/ACT IN AERO
2.0000 | INHALATION_SPRAY | Freq: Two times a day (BID) | RESPIRATORY_TRACT | Status: DC
  Administered 2015-03-26: 2 via RESPIRATORY_TRACT
  Filled 2015-03-26: qty 8.8

## 2015-03-26 MED ORDER — MENTHOL 3 MG MT LOZG
1.0000 | LOZENGE | OROMUCOSAL | Status: DC | PRN
  Administered 2015-03-30: 3 mg via ORAL
  Filled 2015-03-26: qty 9

## 2015-03-26 MED ORDER — MOXIFLOXACIN HCL 0.5 % OP SOLN: 1.0000 [drp] | Freq: Three times a day (TID) | OPHTHALMIC | Status: DC

## 2015-03-26 MED ORDER — DIFLUPREDNATE 0.05 % OP EMUL
1.0000 [drp] | Freq: Two times a day (BID) | OPHTHALMIC | Status: DC
  Filled 2015-03-26: qty 5

## 2015-03-26 MED ORDER — MOXIFLOXACIN HCL 0.5 % OP SOLN
1.0000 [drp] | Freq: Four times a day (QID) | OPHTHALMIC | Status: DC
  Filled 2015-03-26: qty 3

## 2015-03-26 MED ORDER — MOXIFLOXACIN HCL 0.5 % OP SOLN
1.0000 [drp] | Freq: Three times a day (TID) | OPHTHALMIC | Status: DC
  Filled 2015-03-26: qty 3

## 2015-03-26 MED ORDER — ACETAMINOPHEN 10 MG/ML IV SOLN
INTRAVENOUS | Status: DC | PRN
  Administered 2015-03-26: 1000 mg via INTRAVENOUS

## 2015-03-26 MED ORDER — NEPAFENAC 0.3 % OP SUSP
1.0000 [drp] | Freq: Every day | OPHTHALMIC | Status: DC
  Filled 2015-03-26: qty 1.7

## 2015-03-26 SURGICAL SUPPLY — 47 items
BAG COUNTER SPONGE EZ (MISCELLANEOUS) IMPLANT
BLADE DEBAKEY 8.0 (BLADE) ×2 IMPLANT
BLADE SAGITTAL WIDE XTHICK NO (BLADE) ×2 IMPLANT
BLADE SURG SZ10 CARB STEEL (BLADE) ×2 IMPLANT
CANISTER SUCT 1200ML W/VALVE (MISCELLANEOUS) ×6 IMPLANT
CAPT HIP HEMI 2 ×2 IMPLANT
CHLORAPREP W/TINT 26ML (MISCELLANEOUS) ×4 IMPLANT
DRAPE INCISE IOBAN 66X60 STRL (DRAPES) ×4 IMPLANT
DRAPE TABLE BACK 80X90 (DRAPES) IMPLANT
DRSG AQUACEL AG ADV 3.5X10 (GAUZE/BANDAGES/DRESSINGS) IMPLANT
DRSG AQUACEL AG ADV 3.5X14 (GAUZE/BANDAGES/DRESSINGS) ×2 IMPLANT
ELECT BLADE 6.5 EXT (BLADE) ×2 IMPLANT
ELECT CAUTERY BLADE 6.4 (BLADE) ×2 IMPLANT
GAUZE PETRO XEROFOAM 1X8 (MISCELLANEOUS) IMPLANT
GAUZE SPONGE 4X4 12PLY STRL (GAUZE/BANDAGES/DRESSINGS) IMPLANT
GLOVE INDICATOR 8.0 STRL GRN (GLOVE) ×2 IMPLANT
GLOVE SURG ORTHO 8.5 STRL (GLOVE) ×2 IMPLANT
GOWN STRL REUS W/ TWL LRG LVL3 (GOWN DISPOSABLE) ×3 IMPLANT
GOWN STRL REUS W/TWL LRG LVL3 (GOWN DISPOSABLE) ×3
HANDPIECE SUCTION TUBG SURGILV (MISCELLANEOUS) ×2 IMPLANT
HEMOVAC 400CC 10FR (MISCELLANEOUS) IMPLANT
HIP CAPITATED HEMI 2 ×1 IMPLANT
IV NS 1000ML (IV SOLUTION) ×1
IV NS 1000ML BAXH (IV SOLUTION) ×1 IMPLANT
KIT RM TURNOVER STRD PROC AR (KITS) ×2 IMPLANT
NEEDLE FILTER BLUNT 18X 1/2SAF (NEEDLE) ×1
NEEDLE FILTER BLUNT 18X1 1/2 (NEEDLE) ×1 IMPLANT
NEEDLE MAYO CATGUT SZ4 (NEEDLE) ×2 IMPLANT
NEEDLE SPNL 18GX3.5 QUINCKE PK (NEEDLE) ×2 IMPLANT
NS IRRIG 1000ML POUR BTL (IV SOLUTION) ×2 IMPLANT
PACK HIP PROSTHESIS (MISCELLANEOUS) ×2 IMPLANT
PAD ABD DERMACEA PRESS 5X9 (GAUZE/BANDAGES/DRESSINGS) IMPLANT
PAD GROUND ADULT SPLIT (MISCELLANEOUS) ×2 IMPLANT
SOL PREP PVP 2OZ (MISCELLANEOUS) ×2
SOLUTION PREP PVP 2OZ (MISCELLANEOUS) ×1 IMPLANT
STAPLER SKIN PROX 35W (STAPLE) ×2 IMPLANT
SUT DVC 2 QUILL PDO  T11 36X36 (SUTURE) ×1
SUT DVC 2 QUILL PDO T11 36X36 (SUTURE) ×1 IMPLANT
SUT QUILL PDO 0 36 36 VIOLET (SUTURE) ×2 IMPLANT
SUT TICRON 2-0 30IN 311381 (SUTURE) ×10 IMPLANT
SYR 30ML LL (SYRINGE) ×2 IMPLANT
SYR 50ML LL SCALE MARK (SYRINGE) IMPLANT
SYRINGE 10CC LL (SYRINGE) ×2 IMPLANT
TAPE MICROFOAM 4IN (TAPE) IMPLANT
TIP COAXIAL FEMORAL CANAL (MISCELLANEOUS) IMPLANT
TUBE SUCT KAM VAC (TUBING) ×2 IMPLANT
WATER STERILE IRR 1000ML POUR (IV SOLUTION) ×2 IMPLANT

## 2015-03-26 NOTE — Progress Notes (Signed)
[place] [facility] [place] NAME: [name]    MR#:  [id number]  DATE OF BIRTH:  1936-05-07  SUBJECTIVE:  CHIEF COMPLAINT:  Pt is c/o pain in the hip, uncomfortable to move  REVIEW OF SYSTEMS:  CONSTITUTIONAL: No fever, fatigue or weakness.  EYES: No blurred or double vision.  EARS, NOSE, AND THROAT: No tinnitus or ear pain.  RESPIRATORY: No cough, shortness of breath, wheezing or hemoptysis.  CARDIOVASCULAR: No chest pain, orthopnea, edema.  GASTROINTESTINAL: No nausea, vomiting, diarrhea or abdominal pain.  GENITOURINARY: No dysuria, hematuria.  ENDOCRINE: No polyuria, nocturia,  HEMATOLOGY: No anemia, easy bruising or bleeding SKIN: No rash or lesion. MUSCULOSKELETAL: left hip pain .   NEUROLOGIC: No tingling, numbness, weakness.  PSYCHIATRY: No anxiety or depression.   DRUG ALLERGIES:   Allergies  Allergen Reactions  . Codeine Nausea And Vomiting  . Penicillins Hives  . Sulfa Antibiotics   . Doxycycline Rash  . Erythromycin Rash  . Latex Rash    VITALS:  Blood pressure 162/74, pulse 88, temperature 98.6 F (37 C), temperature source Oral, resp. rate 18, height 5\' 4"  (1.626 m), weight 59.376 kg (130 lb 14.4 oz), SpO2 100 %.  PHYSICAL EXAMINATION:  GENERAL:  79 y.o.-year-old patient lying in the bed with no acute distress.  EYES: Pupils equal, round, reactive to light and accommodation. No scleral icterus. Extraocular muscles intact.  HEENT: Head atraumatic, normocephalic. Oropharynx and nasopharynx clear.  NECK:  Supple, no jugular venous distention. No thyroid enlargement, no tenderness.  LUNGS: Normal breath sounds bilaterally, no wheezing, rales,rhonchi or crepitation. No use of accessory muscles of respiration.  CARDIOVASCULAR: S1, S2 normal. No murmurs, rubs, or gallops.  ABDOMEN: Soft, nontender, nondistended. Bowel sounds present. No organomegaly or mass.  EXTREMITIES: No pedal edema, cyanosis, or clubbing.   NEUROLOGIC: Cranial nerves II through XII are intact. Muscle strength 5/5 in all extremities except lle.  Sensation intact. Gait not checked.  PSYCHIATRIC: The patient is alert and oriented x 3.  SKIN: No obvious rash, lesion, or ulcer.    LABORATORY PANEL:   CBC  Recent Labs Lab 03/26/15 0625  WBC 8.5  HGB 11.4*  HCT 34.3*  PLT 152   ------------------------------------------------------------------------------------------------------------------  Chemistries   Recent Labs Lab 03/25/15 2316 03/26/15 0625  NA 142 140  K 4.0 4.2  CL 103 103  CO2 33* 32  GLUCOSE 151* 190*  BUN 16 16  CREATININE 0.78 0.70  CALCIUM 9.3 8.7*  AST 20  --   ALT 17  --   ALKPHOS 67  --   BILITOT 0.4  --    ------------------------------------------------------------------------------------------------------------------  Cardiac Enzymes No results for input(s): TROPONINI in the last 168 hours. ------------------------------------------------------------------------------------------------------------------  RADIOLOGY:  Dg Chest 1 View  03/25/2015  CLINICAL DATA:  Left hip injury after a fall. History of COPD on oxygen. EXAM: CHEST 1 VIEW COMPARISON:  10/21/2013 FINDINGS: Diffuse emphysematous changes in the chest. Normal heart size and pulmonary vascularity. No focal airspace disease or consolidation in the lungs. No blunting of costophrenic angles. No pneumothorax. Mediastinal contours appear intact. Calcification of the aorta. IMPRESSION: Emphysematous changes in the lungs. No evidence of active pulmonary disease. Electronically Signed   By: Lucienne Capers M.D.   On: 03/25/2015 23:55   Dg Hip Unilat With Pelvis 1v Left  03/26/2015  CLINICAL DATA:  Postoperative left hip hemiarthroplasty. EXAM: DG HIP (WITH OR WITHOUT PELVIS) 1V*L* COMPARISON:  None. FINDINGS: Left hip hemi arthroplasty identified without  subluxation or dislocation. No acute bony abnormalities are present. No hardware  complicating features are noted. Soft tissue postoperative changes are present. IMPRESSION: Left hip hemiarthroplasty without complicating features. Electronically Signed   By: Margarette Canada M.D.   On: 03/26/2015 18:29   Dg Hip Unilat With Pelvis 2-3 Views Left  03/25/2015  CLINICAL DATA:  79 year old female tripped and fell going to the bathroom with left hip pain. Initial encounter. EXAM: DG HIP (WITH OR WITHOUT PELVIS) 2-3V LEFT COMPARISON:  CT Abdomen and Pelvis 02/04/2014 FINDINGS: Comminuted left femoral neck fracture with varus impaction. The intertrochanteric segment appears to remain intact. The left femoral head remains articulating with the acetabulum. Small mildly displaced comminution fragments including that projecting adjacent to the lesser trochanter. Pelvis Intact. Proximal right femur appears intact. Stable small focus of dystrophic calcification in the medial proximal right thigh soft tissues. IMPRESSION: Comminuted left femoral neck fracture with varus impaction. Electronically Signed   By: Genevie Ann M.D.   On: 03/25/2015 23:50    EKG:   Orders placed or performed during the hospital encounter of 03/25/15  . EKG 12-Lead  . EKG 12-Lead    ASSESSMENT AND PLAN:   # Femoral neck fracture, left, closed, initial encounter -   S/p left hip hemiarthroplasty pod #0 Pain rx per ortho recommendations. Active Problems:  # Severe COPD -  No exacerbation continue home inhalers Dulera, Spiriva, and albuterol nebulizer as needed.   O2 via nasal cannula  # Diabetes mellitus, type 2 ([place]) -  on Januvia at home. We will hold this   sliding scale insulin with  fingerstick glucose checks every 6 hours while nothing by mouth, and then before meals and at bedtime when she is eating again.  She will need a carb modified diet.   # Anxiety - Continue home meds   #GERD (gastroesophageal reflux disease) - PPI while here   All the records are reviewed and case discussed with ED  provider. Management plans discussed with the patient and/or family.  DVT PROPHYLAXIS: Mechanical only      All the records are reviewed and case discussed with Care Management/Social Workerr. Management plans discussed with the patient, family and they are in agreement.  CODE STATUS: full code  TOTAL TIME TAKING CARE OF THIS PATIENT: 35 minutes.   POSSIBLE D/C IN 2-3 DAYS, DEPENDING ON CLINICAL CONDITION.   Nicholes Mango M.D on 03/26/2015 at 7:23 PM  Between 7am to 6pm - Pager - [phone] After 6pm go to [email or web address] - password [email or web address] [facility]  Office  [phone]  CC: Primary care physician; Halina Maidens, MD

## 2015-03-26 NOTE — ED Provider Notes (Signed)
[facility] Emergency Department Provider Note  ____________________________________________  Time seen: Approximately 12:06 AM  I have reviewed the triage vital signs and the nursing notes.   HISTORY  Chief Complaint Fall    HPI [name] is a 79 y.o. female who presents to the ED from home via EMS s/p mechanical fall. Patient tripped over her oxygen tubing and fell, striking her left hip. Denies striking head or LOC. Arrives to the ED with left leg shortening and rotated externally. Patient has a history of COPD on continuous oxygen. Denies recent fever, chills, chest pain, abdominal pain, diarrhea, headache, weakness. Patient vomited once after her ambulance ride. Nothing makes the pain better. Movement makes the pain worse.   Past Medical History  Diagnosis Date  . DM (diabetes mellitus) ([place])   . Mitral valve prolapse   . COPD (chronic obstructive pulmonary disease) ([place])   . Palpitations   . Osteoarthritis   . Systemic lupus erythematosus ([place])   . Lumbar spondylolysis   . Raynaud's disease   . Anxiety   . IBS (irritable bowel syndrome)   . Uveitis, anterior     ????  . Rheumatic fever   . Lupus ([place])     ? how diagnosed  . Ankylosing spondylitis ([place])     ? how diagnosed  . Insomnia, unspecified   . Chronic peptic ulcer, unspecified site, without mention of hemorrhage, perforation, or obstruction   . Paroxysmal tachycardia, unspecified ([place])   . History of kidney stones   . Chronic kidney infection   . Asthma     as child  . Pneumonia   . PONV (postoperative nausea and vomiting)   . Hypothyroidism     mass, U/S 6/21 - nodules  . Motion sickness     car - back seat  . GERD (gastroesophageal reflux disease)   . Wears dentures     full upper, partial lower  . Wears contact lenses   . Oxygen decrease     2l prn  . Raynaud disease   . Headache   . Neuropathy ([place])   . Wheezing     Patient Active Problem List   Diagnosis Date Noted  . Gastroduodenal ulcer 01/05/2015  . Difficulty swallowing solids   . Esophageal candidiasis ([place])   . Acquired hypothyroidism 08/18/2014  . Diabetes mellitus, type 2 ([place]) 08/18/2014  . Big thyroid 04/06/2014  . Disseminated lupus erythematosus ([place]) 04/03/2014  . Palpitations 04/25/2013  . On home oxygen therapy - prn 04/19/2013  . COPD (chronic obstructive pulmonary disease) ([place]) 04/19/2013    Past Surgical History  Procedure Laterality Date  . Esophagogastroduodenoscopy  multiple  . Colonoscopy  multiple  . Total abdominal hysterectomy w/ bilateral salpingoophorectomy  1990  . Breast biopsy    . Biopsy thyroid    . Abdominal hysterectomy    . Esophagogastroduodenoscopy (egd) with propofol N/A 12/11/2014    Procedure: ESOPHAGOGASTRODUODENOSCOPY (EGD) WITH PROPOFOL;  Surgeon: [name], MD;  Location: [facility];  Service: Endoscopy;  Laterality: N/A;  cytology brushing  . Cataract extraction w/phaco Left 03/23/2015    Procedure: CATARACT EXTRACTION PHACO AND INTRAOCULAR LENS PLACEMENT (IOC);  Surgeon: Birder Robson, MD;  Location: [facility];  Service: Ophthalmology;  Laterality: Left;  [place] 00:51 ap 33.0 cde 10.89    Current Outpatient Rx  Name  Route  Sig  Dispense  Refill  . albuterol (PROVENTIL HFA;VENTOLIN HFA) 108 (90 BASE) MCG/ACT inhaler   Inhalation   Inhale into the  lungs 4 (four) times daily - after meals and at bedtime.          Marland Kitchen albuterol (PROVENTIL) (2.5 MG/3ML) 0.083% nebulizer solution   Nebulization   Take 2.5 mg by nebulization every 6 (six) hours as needed for wheezing or shortness of breath.         . AMBULATORY NON FORMULARY MEDICATION      Oxygen (USES AS NEEDED)         . LORazepam (ATIVAN) 1 MG tablet   Oral   Take 0.5 tablets (0.5 mg total) by mouth 2 (two) times daily as needed.   90 tablet   0   . Probiotic Product (ALIGN PO)   Oral   Take by mouth.         . ranitidine (ZANTAC) 150 MG tablet    Oral   Take 150 mg by mouth 2 (two) times daily.         . sitaGLIPtin (JANUVIA) 50 MG tablet   Oral   Take 1 tablet (50 mg total) by mouth daily.   90 tablet   0   . sucralfate (CARAFATE) 1 G tablet   Oral   Take 1 g by mouth 4 (four) times daily.         Marland Kitchen Umeclidinium-Vilanterol (ANORO ELLIPTA IN)   Inhalation   Inhale into the lungs.           Allergies Codeine; Penicillins; Sulfa antibiotics; Doxycycline; Erythromycin; and Latex  Family History  Problem Relation Age of Onset  . Inflammatory bowel disease    . Ovarian cancer    . Diabetes    . Heart disease    . Irritable bowel syndrome    . Kidney disease    . Heart disease Mother   . Hypothyroidism Mother   . Diabetes Mother   . Heart attack Mother   . Hypertension Mother   . Heart disease Father   . Heart attack Father   . Breast cancer Sister   . Hypothyroidism Sister   . Diabetes Sister   . Ovarian cancer Sister   . Diabetes Brother   . Prostate cancer Brother   . Hypothyroidism Daughter     Social History Social History  Substance Use Topics  . Smoking status: Former Smoker -- 0.25 packs/day for 40 years    Types: Cigarettes    Quit date: 10/11/2011  . Smokeless tobacco: Never Used     Comment: Quit 4 months ago   . Alcohol Use: No    Review of Systems Constitutional: No fever/chills Eyes: No visual changes. ENT: No sore throat. Cardiovascular: Denies chest pain. Respiratory: Denies shortness of breath. Gastrointestinal: No abdominal pain.  No nausea, no vomiting.  No diarrhea.  No constipation. Genitourinary: Negative for dysuria. Musculoskeletal: Positive for left hip pain. Negative for back pain. Skin: Negative for rash. Neurological: Negative for headaches, focal weakness or numbness.  10-point ROS otherwise negative.  ____________________________________________   PHYSICAL EXAM:  VITAL SIGNS: ED Triage Vitals  Enc Vitals Group     BP 03/25/15 2244 151/84 mmHg      Pulse Rate 03/25/15 2244 74     Resp 03/25/15 2244 16     Temp 03/25/15 2244 97.6 F (36.4 C)     Temp Source 03/25/15 2244 Oral     SpO2 03/25/15 2244 99 %     Weight 03/25/15 2244 127 lb (57.607 kg)     Height 03/25/15 2244 5\' 4"  (1.626 m)  Head Cir --      Peak Flow --      Pain Score 03/25/15 2246 8     Pain Loc --      Pain Edu? --      Excl. in [place]? --     Constitutional: Alert and oriented. Well appearing and in mild acute distress. Anxious. Eyes: Conjunctivae are normal. PERRL. EOMI. Head: Atraumatic. Nose: No congestion/rhinnorhea. Mouth/Throat: Mucous membranes are moist.  Oropharynx non-erythematous. Neck: No stridor. No cervical spine tenderness to palpation. Cardiovascular: Normal rate, regular rhythm. Grossly normal heart sounds.  Good peripheral circulation. Respiratory: Normal respiratory effort.  No retractions. Lungs CTAB. Gastrointestinal: Soft and nontender. No distention. No abdominal bruits. No CVA tenderness. Musculoskeletal: Left leg shortened and externally rotated. 2+ femoral pulse. Tender to palpation left hip. Neurologic:  Normal speech and language. No gross focal neurologic deficits are appreciated.  Skin:  Skin is warm, dry and intact. No rash noted. Psychiatric: Mood and affect are normal. Speech and behavior are normal.  ____________________________________________   LABS (all labs ordered are listed, but only abnormal results are displayed)  Labs Reviewed  COMPREHENSIVE METABOLIC PANEL - Abnormal; Notable for the following:    CO2 33 (*)    Glucose, Bld 151 (*)    All other components within normal limits  CBC WITH DIFFERENTIAL/PLATELET - Abnormal; Notable for the following:    Neutro Abs 7.9 (*)    All other components within normal limits  TYPE AND SCREEN   ____________________________________________  EKG  ED ECG REPORT I, Roniya Tetro J, the attending physician, personally viewed and interpreted this ECG.   Date: 03/26/2015   EKG Time: 0028  Rate: 80  Rhythm: normal EKG, normal sinus rhythm  Axis: Normal  Intervals:right bundle branch block  ST&T Change: Nonspecific  ____________________________________________  RADIOLOGY  Left hip x-rays (viewed by me, interpreted per Dr. Nevada Crane): Comminuted left femoral neck fracture with varus impaction.  Portal chest x-ray (viewed by me, interpreted per Dr. Gerilyn Nestle): Emphysematous changes in the lungs. No evidence of active pulmonary disease. ____________________________________________   PROCEDURES  Procedure(s) performed: None  Critical Care performed: No  ____________________________________________   INITIAL IMPRESSION / ASSESSMENT AND PLAN / ED COURSE  Pertinent labs & imaging results that were available during my care of the patient were reviewed by me and considered in my medical decision making (see chart for details).  79 year old female s/p mechanical fall with left femoral neck fracture. Discussed with orthopedics on call Dr. Sabra Heck who will evaluate patient in the morning and likely posterior for surgery tomorrow afternoon. Will discuss with hospitalist for admission. ____________________________________________   FINAL CLINICAL IMPRESSION(S) / ED DIAGNOSES  Final diagnoses:  Left displaced femoral neck fracture, closed, initial encounter ([place])      Paulette Blanch, MD 03/26/15 [phone]

## 2015-03-26 NOTE — Progress Notes (Addendum)
X ray called to say that dr Sabra Heck had only ordered a pelvis x ray.  They said a hip is usually ordered also.  Dr Sabra Heck paged twice.  Dr Mack Guise notified and ordered x ray hip unilateral with pelvis

## 2015-03-26 NOTE — Progress Notes (Signed)
ANTICOAGULATION CONSULT NOTE - Initial Consult  Pharmacy Consult for Lovenox  Indication: VTE prophylaxis  Allergies  Allergen Reactions  . Codeine Nausea And Vomiting  . Penicillins Hives  . Sulfa Antibiotics   . Doxycycline Rash  . Erythromycin Rash  . Latex Rash    Patient Measurements: Height: 5\' 4"  (162.6 cm) Weight: 130 lb 14.4 oz (59.376 kg) IBW/kg (Calculated) : 54.7 Heparin Dosing Weight:   Vital Signs: Temp: 96.8 F (36 C) (10/14 1656) Temp Source: Axillary (10/14 1656) BP: 89/50 mmHg (10/14 1656) Pulse Rate: 71 (10/14 1656)  Labs:  Recent Labs  03/25/15 2316 03/26/15 0625  HGB 12.1 11.4*  HCT 35.7 34.3*  PLT 163 152  APTT  --  32  LABPROT  --  16.8*  INR  --  1.34  CREATININE 0.78 0.70    Estimated Creatinine Clearance: 51.7 mL/min (by C-G formula based on Cr of 0.7).   Medical History: Past Medical History  Diagnosis Date  . DM (diabetes mellitus) ([place])   . Mitral valve prolapse   . COPD (chronic obstructive pulmonary disease) ([place])   . Palpitations   . Osteoarthritis   . Systemic lupus erythematosus ([place])   . Lumbar spondylolysis   . Raynaud's disease   . Anxiety   . IBS (irritable bowel syndrome)   . Uveitis, anterior     ????  . Rheumatic fever   . Ankylosing spondylitis ([place])     ? how diagnosed  . Insomnia, unspecified   . Chronic peptic ulcer, unspecified site, without mention of hemorrhage, perforation, or obstruction   . Paroxysmal tachycardia, unspecified ([place])   . History of kidney stones   . Chronic kidney infection   . Asthma     as child  . Pneumonia   . PONV (postoperative nausea and vomiting)   . Hypothyroidism     mass, U/S 6/21 - nodules  . Motion sickness     car - back seat  . GERD (gastroesophageal reflux disease)   . Wears dentures     full upper, partial lower  . Wears contact lenses   . Oxygen decrease     2l prn  . Raynaud disease   . Headache   . Neuropathy ([place])   . Wheezing     Medications:   Prescriptions prior to admission  Medication Sig Dispense Refill Last Dose  . moxifloxacin (VIGAMOX) 0.5 % ophthalmic solution 1 drop QID.     Marland Kitchen tiotropium (SPIRIVA) 18 MCG inhalation capsule Place 18 mcg into inhaler and inhale daily.     Marland Kitchen albuterol (PROVENTIL HFA;VENTOLIN HFA) 108 (90 BASE) MCG/ACT inhaler Inhale into the lungs 4 (four) times daily - after meals and at bedtime.    03/22/2015 at Unknown time  . albuterol (PROVENTIL) (2.5 MG/3ML) 0.083% nebulizer solution Take 2.5 mg by nebulization every 6 (six) hours as needed for wheezing or shortness of breath.   03/22/2015 at Unknown time  . AMBULATORY NON FORMULARY MEDICATION Oxygen (USES AS NEEDED)   Taking  . LORazepam (ATIVAN) 1 MG tablet Take 0.5 tablets (0.5 mg total) by mouth 2 (two) times daily as needed. 90 tablet 0 03/22/2015 at Unknown time  . Probiotic Product (ALIGN PO) Take by mouth.   03/22/2015 at Unknown time  . ranitidine (ZANTAC) 150 MG tablet Take 150 mg by mouth 2 (two) times daily.   03/22/2015 at Unknown time  . sitaGLIPtin (JANUVIA) 50 MG tablet Take 1 tablet (50 mg total) by mouth daily. 90 tablet 0  03/22/2015 at Unknown time  . sucralfate (CARAFATE) 1 G tablet Take 1 g by mouth 4 (four) times daily.   03/22/2015 at Unknown time  . Umeclidinium-Vilanterol (ANORO ELLIPTA IN) Inhale into the lungs.   03/22/2015 at Unknown time    Assessment: S/P surgical hip repair CrCl = 51.7 ml/min  Goal of Therapy:  DVT prophylaxis   Plan:  Lovenox 30 mg SQ Q24H originally ordered. Will adjust dose to lovenox 30 mg SQ Q12H to start 10/15 @ 8:00.   Kidus Delman D 03/26/2015,5:20 PM

## 2015-03-26 NOTE — Op Note (Signed)
03/25/2015 - 03/26/2015  3:22 PM  PATIENT:  [name]   MRN: [id number]  PRE-OPERATIVE DIAGNOSIS:  LEFT HIP FX FEMORAL NECK DISPLACED  POST-OPERATIVE DIAGNOSIS:  left hip fracture  PROCEDURE:  Procedure(s): ARTHROPLASTY UNIPOLAR HIP (HEMIARTHROPLASTY) LEFT  PREOPERATIVE INDICATIONS:  [name] is an 79 y.o. female who was admitted 03/25/2015 with a diagnosis of Femoral neck fracture, left, closed, initial encounter and elected for surgical management.  The risks benefits and alternatives were discussed with the patient including but not limited to the risks of nonoperative treatment, versus surgical intervention including infection, bleeding, nerve injury, periprosthetic fracture, the need for revision surgery, dislocation, leg length discrepancy, blood clots, cardiopulmonary complications, morbidity, mortality, among others, and they were willing to proceed.  Predicted outcome is good, although there will be at least a six to nine month expected recovery.     SURGEON:  Earnestine Leys, MD    ANESTHESIA: Spinal    COMPLICATIONS:  None.      COMPONENTS:  [employer] Accolade Femoral Fracture stem size   #2  ,   and a size   45MM   fracture head unipolar hip ball with    +4MM   neck length.    PROCEDURE IN DETAIL: The patient was met in the holding area and identified.  The appropriate hip  was marked at the operative site. The patient was then transported to the OR and  placed under general anesthesia.  At that point, the patient was  placed in the lateral decubitus position with the operative side up and  secured to the operating room table and all bony prominences padded.     The operative lower extremity was prepped from the iliac crest to the toes.  Sterile draping was performed.  Time out was performed prior to incision.      A routine posterolateral approach was utilized via sharp dissection  carried down to the subcutaneous tissue.  Gross bleeders were Bovie   coagulated.  The iliotibial band was identified and incised  along the length of the skin incision.  Self-retaining retractors were  inserted.  With the hip internally rotated, the short external rotators  were identified. The piriformis was tagged and the hip capsule released in a T-type fashion.  The femoral neck was exposed, and I resected the femoral neck using the appropriate jig. This was performed at approximately a thumb's breadth above the lesser trochanter.    I then exposed the deep acetabulum, cleared out any tissue including the ligamentum teres.    I then prepared the proximal femur using the cookie-cutter, the lateralizing reamer, and then sequentially broached.  A trial stem size #2   was  utilized along with a   63m   unipolar head with   +462m  neck and I reduced the hip and it was found to have excellent stability with functional range of motion. Leg lengths were equal.  The trial components were then removed.   The same size Accolade femoral stem was then inserted and was very stable.  The Unitrax head and neck as trialed were inserted as well.     The hip was then reduced and taken through functional range of motion and found to have excellent stability. Leg lengths were restored.    I closed the T in the capsule with #2 Ticron as well as the short external rotators. A hemovac was inserted.    I then irrigated the hip copiously again with pulse lavage, and  repaired the fascia with #2 Quill and the subcutaneous layer with #0 Quill. Sponge and needle counts were correct.   Dry sterile Aquacell was applied.   The patient was then awakened and returned to PACU in stable and satisfactory condition. There were no complications.  Park Breed, MD Orthopedic Surgeon [phone]   03/26/2015 3:22 PM

## 2015-03-26 NOTE — H&P (Signed)
THE PATIENT WAS SEEN IN THE HOLDING AREA.  HISTORY, ALLERGIES, HOME MEDICATIONS AND OPERATIVE PROCEDURE WERE REVIEWED. RISKS AND BENEFITS OF SURGERY DISCUSSED WITH PATIENT AGAIN.  NO CHANGES FROM INITIAL HISTORY AND PHYSICAL NOTED.    

## 2015-03-26 NOTE — Progress Notes (Signed)
Paged Dr. Margaretmary Eddy re: patient's b/p of 87/44.

## 2015-03-26 NOTE — Progress Notes (Signed)
No patient education done today. Patient in surgery.

## 2015-03-26 NOTE — Transfer of Care (Signed)
Immediate Anesthesia Transfer of Care Note  Patient: [name]  Procedure(s) Performed: Procedure(s): ARTHROPLASTY BIPOLAR HIP (HEMIARTHROPLASTY) (Left)  Patient Location: PACU  Anesthesia Type:Spinal  Level of Consciousness: sedated  Airway & Oxygen Therapy: Patient Spontanous Breathing and Patient connected to face mask oxygen  Post-op Assessment: Report given to RN and Post -op Vital signs reviewed and stable  Post vital signs: Reviewed and stable  Last Vitals:  Filed Vitals:   03/26/15 0704  BP: 115/53  Pulse: 72  Temp: 36.7 C  Resp: 16    Complications: No apparent anesthesia complications

## 2015-03-26 NOTE — H&P (Addendum)
[facility] [place] NAME: [name]    MR#:  [id number]  DATE OF BIRTH:  [birth year]  DATE OF ADMISSION:  03/25/2015  PRIMARY CARE PHYSICIAN: Madison Maidens, MD   REQUESTING/REFERRING PHYSICIAN: Beather Arbour, MD  CHIEF COMPLAINT:   Chief Complaint  Patient presents with  . Fall    HISTORY OF PRESENT ILLNESS:  [name]  is a 79 y.o. female who presents with left femoral neck fracture. Patient states that she was trying to get to her bathroom and got tripped up and her oxygen tubing and fell onto her left hip, breaking it. Patient wears oxygen at home chronically due to COPD. Lab and imaging workup in the ED largely benign, except for left hip fracture. Orthopedic consult in the ED, we'll see patient for repair in the morning. Hospitalists were called for admission.  PAST MEDICAL HISTORY:   Past Medical History  Diagnosis Date  . DM (diabetes mellitus) ([place])   . Mitral valve prolapse   . COPD (chronic obstructive pulmonary disease) ([place])   . Palpitations   . Osteoarthritis   . Systemic lupus erythematosus ([place])   . Lumbar spondylolysis   . Raynaud's disease   . Anxiety   . IBS (irritable bowel syndrome)   . Uveitis, anterior     ????  . Rheumatic fever   . Ankylosing spondylitis ([place])     ? how diagnosed  . Insomnia, unspecified   . Chronic peptic ulcer, unspecified site, without mention of hemorrhage, perforation, or obstruction   . Paroxysmal tachycardia, unspecified ([place])   . History of kidney stones   . Chronic kidney infection   . Asthma     as child  . Pneumonia   . PONV (postoperative nausea and vomiting)   . Hypothyroidism     mass, U/S 6/21 - nodules  . Motion sickness     car - back seat  . GERD (gastroesophageal reflux disease)   . Wears dentures     full upper, partial lower  . Wears contact lenses   . Oxygen decrease     2l prn  . Raynaud disease   . Headache   . Neuropathy ([place])   .  Wheezing     PAST SURGICAL HISTORY:   Past Surgical History  Procedure Laterality Date  . Esophagogastroduodenoscopy  multiple  . Colonoscopy  multiple  . Total abdominal hysterectomy w/ bilateral salpingoophorectomy  1990  . Breast biopsy    . Biopsy thyroid    . Abdominal hysterectomy    . Esophagogastroduodenoscopy (egd) with propofol N/A 12/11/2014    Procedure: ESOPHAGOGASTRODUODENOSCOPY (EGD) WITH PROPOFOL;  Surgeon: [name], MD;  Location: [facility];  Service: Endoscopy;  Laterality: N/A;  cytology brushing  . Cataract extraction w/phaco Left 03/23/2015    Procedure: CATARACT EXTRACTION PHACO AND INTRAOCULAR LENS PLACEMENT (IOC);  Surgeon: Birder Robson, MD;  Location: [facility];  Service: Ophthalmology;  Laterality: Left;  [place] 00:51 ap 33.0 cde 10.89    SOCIAL HISTORY:   Social History  Substance Use Topics  . Smoking status: Former Smoker -- 0.25 packs/day for 40 years    Types: Cigarettes    Quit date: 10/11/2011  . Smokeless tobacco: Never Used     Comment: Quit 4 months ago   . Alcohol Use: No    FAMILY HISTORY:   Family History  Problem Relation Age of Onset  . Inflammatory bowel disease    . Ovarian cancer    .  Diabetes    . Heart disease    . Irritable bowel syndrome    . Kidney disease    . Heart disease Mother   . Hypothyroidism Mother   . Diabetes Mother   . Heart attack Mother   . Hypertension Mother   . Heart disease Father   . Heart attack Father   . Breast cancer Sister   . Hypothyroidism Sister   . Diabetes Sister   . Ovarian cancer Sister   . Diabetes Brother   . Prostate cancer Brother   . Hypothyroidism Daughter     DRUG ALLERGIES:   Allergies  Allergen Reactions  . Codeine Nausea And Vomiting  . Penicillins Hives  . Sulfa Antibiotics   . Doxycycline Rash  . Erythromycin Rash  . Latex Rash    MEDICATIONS AT HOME:   Prior to Admission medications   Medication Sig Start Date End Date Taking? Authorizing  Provider  tiotropium (SPIRIVA) 18 MCG inhalation capsule Place 18 mcg into inhaler and inhale daily.   Yes Historical Provider, MD  albuterol (PROVENTIL HFA;VENTOLIN HFA) 108 (90 BASE) MCG/ACT inhaler Inhale into the lungs 4 (four) times daily - after meals and at bedtime.     Historical Provider, MD  albuterol (PROVENTIL) (2.5 MG/3ML) 0.083% nebulizer solution Take 2.5 mg by nebulization every 6 (six) hours as needed for wheezing or shortness of breath.    Historical Provider, MD  AMBULATORY NON FORMULARY MEDICATION Oxygen (USES AS NEEDED)    Historical Provider, MD  LORazepam (ATIVAN) 1 MG tablet Take 0.5 tablets (0.5 mg total) by mouth 2 (two) times daily as needed. 02/22/15   Glean Hess, MD  Probiotic Product (ALIGN PO) Take by mouth.    Historical Provider, MD  ranitidine (ZANTAC) 150 MG tablet Take 150 mg by mouth 2 (two) times daily.    Historical Provider, MD  sitaGLIPtin (JANUVIA) 50 MG tablet Take 1 tablet (50 mg total) by mouth daily. 02/22/15   Glean Hess, MD  sucralfate (CARAFATE) 1 G tablet Take 1 g by mouth 4 (four) times daily.    Historical Provider, MD  Umeclidinium-Vilanterol [facility] ELLIPTA IN) Inhale into the lungs.    Historical Provider, MD    REVIEW OF SYSTEMS:  Review of Systems  Constitutional: Negative for fever, chills, weight loss and malaise/fatigue.  HENT: Negative for ear pain, hearing loss and tinnitus.   Eyes: Negative for blurred vision, double vision, pain and redness.  Respiratory: Negative for cough, hemoptysis and shortness of breath.   Cardiovascular: Negative for chest pain, palpitations, orthopnea and leg swelling.  Gastrointestinal: Negative for nausea, vomiting, abdominal pain, diarrhea and constipation.  Genitourinary: Negative for dysuria, frequency and hematuria.  Musculoskeletal: Positive for joint pain (left hip). Negative for back pain and neck pain.  Skin:       No acne, rash, or lesions  Neurological: Negative for dizziness,  tremors, focal weakness and weakness.  Endo/Heme/Allergies: Negative for polydipsia. Does not bruise/bleed easily.  Psychiatric/Behavioral: Negative for depression. The patient is not nervous/anxious and does not have insomnia.      VITAL SIGNS:   Filed Vitals:   03/25/15 2244  BP: 151/84  Pulse: 74  Temp: 97.6 F (36.4 C)  TempSrc: Oral  Resp: 16  Height: 5\' 4"  (1.626 m)  Weight: 57.607 kg (127 lb)  SpO2: 99%   Wt Readings from Last 3 Encounters:  03/25/15 57.607 kg (127 lb)  03/23/15 55.792 kg (123 lb)  02/24/15 55.792 kg (123 lb)  PHYSICAL EXAMINATION:  Physical Exam  Vitals reviewed. Constitutional: She is oriented to person, place, and time. She appears well-developed and well-nourished. No distress.  HENT:  Head: Normocephalic and atraumatic.  Mouth/Throat: Oropharynx is clear and moist.  Eyes: Conjunctivae and EOM are normal. Pupils are equal, round, and reactive to light. No scleral icterus.  Neck: Normal range of motion. Neck supple. No JVD present. No thyromegaly present.  Cardiovascular: Normal rate, regular rhythm and intact distal pulses.  Exam reveals no gallop and no friction rub.   No murmur heard. Respiratory: Effort normal and breath sounds normal. No respiratory distress. She has no wheezes. She has no rales.  GI: Soft. Bowel sounds are normal. She exhibits no distension. There is no tenderness.  Musculoskeletal: She exhibits tenderness (Left hip). She exhibits no edema.  Shortened everted left leg, no arthritis, no gout  Lymphadenopathy:    She has no cervical adenopathy.  Neurological: She is alert and oriented to person, place, and time. No cranial nerve deficit.  No dysarthria, no aphasia  Skin: Skin is warm and dry. No rash noted. No erythema.  Psychiatric: She has a normal mood and affect. Her behavior is normal. Judgment and thought content normal.    LABORATORY PANEL:   CBC  Recent Labs Lab 03/25/15 2316  WBC 9.9  HGB 12.1  HCT  35.7  PLT 163   ------------------------------------------------------------------------------------------------------------------  Chemistries   Recent Labs Lab 03/25/15 2316  NA 142  K 4.0  CL 103  CO2 33*  GLUCOSE 151*  BUN 16  CREATININE 0.78  CALCIUM 9.3  AST 20  ALT 17  ALKPHOS 67  BILITOT 0.4   ------------------------------------------------------------------------------------------------------------------  Cardiac Enzymes No results for input(s): TROPONINI in the last 168 hours. ------------------------------------------------------------------------------------------------------------------  RADIOLOGY:  Dg Chest 1 View  03/25/2015  CLINICAL DATA:  Left hip injury after a fall. History of COPD on oxygen. EXAM: CHEST 1 VIEW COMPARISON:  10/21/2013 FINDINGS: Diffuse emphysematous changes in the chest. Normal heart size and pulmonary vascularity. No focal airspace disease or consolidation in the lungs. No blunting of costophrenic angles. No pneumothorax. Mediastinal contours appear intact. Calcification of the aorta. IMPRESSION: Emphysematous changes in the lungs. No evidence of active pulmonary disease. Electronically Signed   By: Lucienne Capers M.D.   On: 03/25/2015 23:55   Dg Hip Unilat With Pelvis 2-3 Views Left  03/25/2015  CLINICAL DATA:  79 year old female tripped and fell going to the bathroom with left hip pain. Initial encounter. EXAM: DG HIP (WITH OR WITHOUT PELVIS) 2-3V LEFT COMPARISON:  CT Abdomen and Pelvis 02/04/2014 FINDINGS: Comminuted left femoral neck fracture with varus impaction. The intertrochanteric segment appears to remain intact. The left femoral head remains articulating with the acetabulum. Small mildly displaced comminution fragments including that projecting adjacent to the lesser trochanter. Pelvis Intact. Proximal right femur appears intact. Stable small focus of dystrophic calcification in the medial proximal right thigh soft tissues.  IMPRESSION: Comminuted left femoral neck fracture with varus impaction. Electronically Signed   By: Genevie Ann M.D.   On: 03/25/2015 23:50    EKG:   Orders placed or performed during the hospital encounter of 03/25/15  . EKG 12-Lead  . EKG 12-Lead    IMPRESSION AND PLAN:  Principal Problem:   Femoral neck fracture, left, closed, initial encounter - after mechanical fall, orthopedic consult for repair. Follow their recommendations. Active Problems:   Severe COPD - continue home inhalers. Patient states that she is currently using Dulera, Spiriva, and albuterol nebulizer as  needed. We'll order the same here, along with her chronic O2 via nasal cannula   Diabetes mellitus, type 2 ([place]) - on Januvia at home. We will hold this while here, and use sliding scale insulin with appropriate fingerstick glucose checks every 6 hours while nothing by mouth, and then before meals and at bedtime when she is eating again. She will need a carb modified diet.   Anxiety - Continue home meds for this    GERD (gastroesophageal reflux disease) - PPI while here   All the records are reviewed and case discussed with ED provider. Management plans discussed with the patient and/or family.  DVT PROPHYLAXIS: Mechanical only  ADMISSION STATUS: Inpatient  CODE STATUS: Full  TOTAL TIME TAKING CARE OF THIS PATIENT: 45 minutes.    Ming Mcmannis [place] 03/26/2015, 1:26 AM  [facility]  Office  [phone]  CC: Primary care physician; Madison Maidens, MD

## 2015-03-26 NOTE — Anesthesia Procedure Notes (Addendum)
Date/Time: 03/26/2015 1:20 PM Performed by: Nelda Marseille Pre-anesthesia Checklist: Patient identified, Emergency Drugs available, Suction available, Patient being monitored and Timeout performed Oxygen Delivery Method: Simple face mask   Spinal Patient location during procedure: OR Start time: 03/26/2015 1:30 PM End time: 03/26/2015 1:35 PM Staffing Resident/CRNA: Lasonya Hubner Performed by: resident/CRNA  Preanesthetic Checklist Completed: patient identified, site marked, surgical consent, pre-op evaluation, timeout performed, IV checked, risks and benefits discussed and monitors and equipment checked Spinal Block Patient position: sitting Prep: Betadine Patient monitoring: heart rate, continuous pulse ox, blood pressure and cardiac monitor Approach: midline Location: L4-5 Injection technique: single-shot Needle Needle type: [place]acre and Introducer  Needle gauge: 24 G Needle length: 9 cm Needle insertion depth: 7 cm Assessment Sensory level: T10 Additional Notes Negative paresthesia. Negative blood return. Positive free-flowing CSF. Expiration date of kit checked and confirmed. Patient tolerated procedure well, without complications.

## 2015-03-26 NOTE — Anesthesia Preprocedure Evaluation (Addendum)
Anesthesia Evaluation  Patient identified by MRN, date of birth, ID band Patient awake    Reviewed: Allergy & Precautions, NPO status , Patient's Chart, lab work & pertinent test results  History of Anesthesia Complications (+) PONV  Airway Mallampati: II  TM Distance: <3 FB     Dental   Pulmonary asthma , pneumonia, resolved, COPD,  COPD inhaler and oxygen dependent, former smoker,           Cardiovascular + Peripheral Vascular Disease  + dysrhythmias   MVP   Neuro/Psych  Headaches, Anxiety neuropathy  Neuromuscular disease    GI/Hepatic PUD, GERD  Medicated and Controlled,  Endo/Other  diabetes, Well Controlled, Type 2Hypothyroidism   Renal/GU Renal InsufficiencyRenal disease     Musculoskeletal  (+) Arthritis , Osteoarthritis,  Lumbar spodylosis, ANK spon.   Abdominal   Peds  Hematology   Anesthesia Other Findings SLE, Raynauds,   Reproductive/Obstetrics                           Anesthesia Physical Anesthesia Plan  ASA: IV  Anesthesia Plan: Spinal   Post-op Pain Management:    Induction: Intravenous  Airway Management Planned: Nasal Cannula  Additional Equipment:   Intra-op Plan:   Post-operative Plan:   Informed Consent: I have reviewed the patients History and Physical, chart, labs and discussed the procedure including the risks, benefits and alternatives for the proposed anesthesia with the patient or authorized representative who has indicated his/her understanding and acceptance.   Dental advisory given  Plan Discussed with: CRNA and Surgeon  Anesthesia Plan Comments:         Anesthesia Quick Evaluation

## 2015-03-26 NOTE — Care Management Note (Signed)
Case Management Note  Patient Details  Name: [name] MRN: [id number] Date of Birth: 1935/12/08  Subjective/Objective:                  Met with patient and her husband to discuss discharge planning. Patient states she has already been contacted by "a case manager" but not sure "who it was". Patient is potentially a Medicare Bundle patient.  She lives with her husband, was independent prior to this injury but has walker, bedside commode, and shower chair in the home. She would like to return home but agrees to whatever is recommended. She in on chronic O2 through [facility]. She has used [facility] home health in the past. Her PCP is with [facility]. Surgery today.  Action/Plan: List of home health care agencies left with patient. I have notified Medicare Bundle Team with orthopedics Alinda Money is primary contact [name] [email or web address]>.  Expected Discharge Date:                  Expected Discharge Plan:     In-House Referral:  Clinical Social Work  Discharge planning Services  CM Consult  Post Acute Care Choice:  Home Health Choice offered to:  Patient, Spouse  DME Arranged:    DME Agency:     HH Arranged:    HH Agency:     Status of Service:  In process, will continue to follow  Medicare Important Message Given:    Date Medicare IM Given:    Medicare IM give by:    Date Additional Medicare IM Given:    Additional Medicare Important Message give by:     If discussed at [place] of Stay Meetings, dates discussed:    Additional Comments:  Marshell Garfinkel, RN 03/26/2015, 12:02 PM

## 2015-03-26 NOTE — Progress Notes (Signed)
Per Dr. Margaretmary Eddy one time order 547mL bolus.

## 2015-03-26 NOTE — Consult Note (Signed)
ORTHOPAEDIC CONSULTATION  REQUESTING PHYSICIAN: Nicholes Mango, MD  Chief Complaint: Left hip pain  HPI: [name] is a 79 y.o. female who complains of  Left hip pain after tripping over her oxygen tubing at home last night.  Exam and x-rays in the ER showed a displaced subcapital fx of the left hip.  Admitted for medical evaluation and surgery.  Discussed surgical vs non surgical options with the patient and husband who agree with hemiarthroplasty today.  Risks and benefits discussed with them.   Past Medical History  Diagnosis Date  . DM (diabetes mellitus) ([place])   . Mitral valve prolapse   . COPD (chronic obstructive pulmonary disease) ([place])   . Palpitations   . Osteoarthritis   . Systemic lupus erythematosus ([place])   . Lumbar spondylolysis   . Raynaud's disease   . Anxiety   . IBS (irritable bowel syndrome)   . Uveitis, anterior     ????  . Rheumatic fever   . Ankylosing spondylitis ([place])     ? how diagnosed  . Insomnia, unspecified   . Chronic peptic ulcer, unspecified site, without mention of hemorrhage, perforation, or obstruction   . Paroxysmal tachycardia, unspecified ([place])   . History of kidney stones   . Chronic kidney infection   . Asthma     as child  . Pneumonia   . PONV (postoperative nausea and vomiting)   . Hypothyroidism     mass, U/S 6/21 - nodules  . Motion sickness     car - back seat  . GERD (gastroesophageal reflux disease)   . Wears dentures     full upper, partial lower  . Wears contact lenses   . Oxygen decrease     2l prn  . Raynaud disease   . Headache   . Neuropathy ([place])   . Wheezing    Past Surgical History  Procedure Laterality Date  . Esophagogastroduodenoscopy  multiple  . Colonoscopy  multiple  . Total abdominal hysterectomy w/ bilateral salpingoophorectomy  1990  . Breast biopsy    . Biopsy thyroid    . Abdominal hysterectomy    . Esophagogastroduodenoscopy (egd) with propofol N/A 12/11/2014    Procedure:  ESOPHAGOGASTRODUODENOSCOPY (EGD) WITH PROPOFOL;  Surgeon: [name], MD;  Location: [facility];  Service: Endoscopy;  Laterality: N/A;  cytology brushing  . Cataract extraction w/phaco Left 03/23/2015    Procedure: CATARACT EXTRACTION PHACO AND INTRAOCULAR LENS PLACEMENT (IOC);  Surgeon: Birder Robson, MD;  Location: [facility];  Service: Ophthalmology;  Laterality: Left;  [place] 00:51   . Orif hip fracture Left 03/2015   Social History   Social History  . Marital Status: Married    Spouse Name: N/A  . Number of Children: 4  . Years of Education: N/A   Occupational History  . retired    Social History Main Topics  . Smoking status: Former Smoker -- 0.25 packs/day for 40 years    Types: Cigarettes    Quit date: 10/11/2011  . Smokeless tobacco: Never Used     Comment: Quit 4 months ago   . Alcohol Use: No  . Drug Use: No  . Sexual Activity: Not Asked   Other Topics Concern  . None   Social History Narrative   Married (#2)   Moved from [place] in Summer 2014 - Lives in [place]   1 son, 3 daughters (one lives in this area)   2 caffeinated beverages daily   04/18/2013 update  Family History  Problem Relation Age of Onset  . Inflammatory bowel disease    . Ovarian cancer    . Diabetes    . Heart disease    . Irritable bowel syndrome    . Kidney disease    . Heart disease Mother   . Hypothyroidism Mother   . Diabetes Mother   . Heart attack Mother   . Hypertension Mother   . Heart disease Father   . Heart attack Father   . Breast cancer Sister   . Hypothyroidism Sister   . Diabetes Sister   . Ovarian cancer Sister   . Diabetes Brother   . Prostate cancer Brother   . Hypothyroidism Daughter    Allergies  Allergen Reactions  . Codeine Nausea And Vomiting  . Penicillins Hives  . Sulfa Antibiotics   . Doxycycline Rash  . Erythromycin Rash  . Latex Rash   Prior to Admission medications   Medication Sig Start Date End Date Taking? Authorizing  Provider  moxifloxacin (VIGAMOX) 0.5 % ophthalmic solution 1 drop QID.   Yes Historical Provider, MD  tiotropium (SPIRIVA) 18 MCG inhalation capsule Place 18 mcg into inhaler and inhale daily.   Yes Historical Provider, MD  albuterol (PROVENTIL HFA;VENTOLIN HFA) 108 (90 BASE) MCG/ACT inhaler Inhale into the lungs 4 (four) times daily - after meals and at bedtime.     Historical Provider, MD  albuterol (PROVENTIL) (2.5 MG/3ML) 0.083% nebulizer solution Take 2.5 mg by nebulization every 6 (six) hours as needed for wheezing or shortness of breath.    Historical Provider, MD  AMBULATORY NON FORMULARY MEDICATION Oxygen (USES AS NEEDED)    Historical Provider, MD  LORazepam (ATIVAN) 1 MG tablet Take 0.5 tablets (0.5 mg total) by mouth 2 (two) times daily as needed. 02/22/15   Glean Hess, MD  Probiotic Product (ALIGN PO) Take by mouth.    Historical Provider, MD  ranitidine (ZANTAC) 150 MG tablet Take 150 mg by mouth 2 (two) times daily.    Historical Provider, MD  sitaGLIPtin (JANUVIA) 50 MG tablet Take 1 tablet (50 mg total) by mouth daily. 02/22/15   Glean Hess, MD  sucralfate (CARAFATE) 1 G tablet Take 1 g by mouth 4 (four) times daily.    Historical Provider, MD  Umeclidinium-Vilanterol [facility] ELLIPTA IN) Inhale into the lungs.    Historical Provider, MD   Dg Chest 1 View  03/25/2015  CLINICAL DATA:  Left hip injury after a fall. History of COPD on oxygen. EXAM: CHEST 1 VIEW COMPARISON:  10/21/2013 FINDINGS: Diffuse emphysematous changes in the chest. Normal heart size and pulmonary vascularity. No focal airspace disease or consolidation in the lungs. No blunting of costophrenic angles. No pneumothorax. Mediastinal contours appear intact. Calcification of the aorta. IMPRESSION: Emphysematous changes in the lungs. No evidence of active pulmonary disease. Electronically Signed   By: Lucienne Capers M.D.   On: 03/25/2015 23:55   Dg Hip Unilat With Pelvis 2-3 Views Left  03/25/2015  CLINICAL  DATA:  79 year old female tripped and fell going to the bathroom with left hip pain. Initial encounter. EXAM: DG HIP (WITH OR WITHOUT PELVIS) 2-3V LEFT COMPARISON:  CT Abdomen and Pelvis 02/04/2014 FINDINGS: Comminuted left femoral neck fracture with varus impaction. The intertrochanteric segment appears to remain intact. The left femoral head remains articulating with the acetabulum. Small mildly displaced comminution fragments including that projecting adjacent to the lesser trochanter. Pelvis Intact. Proximal right femur appears intact. Stable small focus of dystrophic calcification in  the medial proximal right thigh soft tissues. IMPRESSION: Comminuted left femoral neck fracture with varus impaction. Electronically Signed   By: Genevie Ann M.D.   On: 03/25/2015 23:50    Positive ROS: All other systems have been reviewed and were otherwise negative with the exception of those mentioned in the HPI and as above.  Physical Exam: General: Alert, no acute distress Cardiovascular: No pedal edema Respiratory: No cyanosis, no use of accessory musculature GI: No organomegaly, abdomen is soft and non-tender Skin: No lesions in the area of chief complaint Neurologic: Sensation intact distally Psychiatric: Patient is competent for consent with normal mood and affect Lymphatic: No axillary or cervical lymphadenopathy  MUSCULOSKELETAL: Left leg short and rotated.  Pain with rom.  NV intact.  Skin intact.    Assessment: Left femoral neck fx  Plan: Left hip hemiarthroplasty    Park Breed, MD [phone]   03/26/2015 12:58 PM

## 2015-03-26 NOTE — Progress Notes (Signed)
Patient with recent eye surgery on this past Tuesay and takes the following eye drops Durzol 0.05%, Ilevro 0.3%

## 2015-03-26 NOTE — ED Notes (Signed)
MD at bedside. 

## 2015-03-27 LAB — BASIC METABOLIC PANEL
ANION GAP: 5 (ref 5–15)
BUN: 12 mg/dL (ref 6–20)
CHLORIDE: 105 mmol/L (ref 101–111)
CO2: 26 mmol/L (ref 22–32)
Calcium: 8 mg/dL — ABNORMAL LOW (ref 8.9–10.3)
Creatinine, Ser: 0.67 mg/dL (ref 0.44–1.00)
GFR calc non Af Amer: >60 mL/min (ref >60)
Glucose, Bld: 182 mg/dL — ABNORMAL HIGH (ref 65–99)
Potassium: 3.7 mmol/L (ref 3.5–5.1)
SODIUM: 136 mmol/L (ref 135–145)

## 2015-03-27 LAB — GLUCOSE, CAPILLARY
GLUCOSE-CAPILLARY: 232 mg/dL — AB (ref 65–99)
GLUCOSE-CAPILLARY: 234 mg/dL — AB (ref 65–99)
GLUCOSE-CAPILLARY: 170 mg/dL — AB (ref 65–99)
Glucose-Capillary: 197 mg/dL — ABNORMAL HIGH (ref 65–99)

## 2015-03-27 LAB — CBC
HEMATOCRIT: 30.9 % — AB (ref 35.0–47.0)
HEMOGLOBIN: 10.5 g/dL — AB (ref 12.0–16.0)
MCH: 32.1 pg (ref 26.0–34.0)
MCHC: 33.9 g/dL (ref 32.0–36.0)
MCV: 94.6 fL (ref 80.0–100.0)
Platelets: 127 10*3/uL — ABNORMAL LOW (ref 150–440)
RBC: 3.27 MIL/uL — AB (ref 3.80–5.20)
RDW: 12.6 % (ref 11.5–14.5)
WBC: 7.4 10*3/uL (ref 3.6–11.0)

## 2015-03-27 LAB — VITAMIN D 25 HYDROXY (VIT D DEFICIENCY, FRACTURES): VIT D 25 HYDROXY: 22 ng/mL — AB (ref 30.0–100.0)

## 2015-03-27 MED ORDER — INSULIN ASPART 100 UNIT/ML ~~LOC~~ SOLN
0.0000 [IU] | Freq: Three times a day (TID) | SUBCUTANEOUS | Status: DC
  Administered 2015-03-27: 3 [IU] via SUBCUTANEOUS
  Administered 2015-03-28: 5 [IU] via SUBCUTANEOUS
  Administered 2015-03-28 (×2): 3 [IU] via SUBCUTANEOUS
  Administered 2015-03-29 (×3): 2 [IU] via SUBCUTANEOUS
  Administered 2015-03-30: 5 [IU] via SUBCUTANEOUS
  Administered 2015-03-30 – 2015-04-01 (×5): 3 [IU] via SUBCUTANEOUS
  Filled 2015-03-27 (×2): qty 3
  Filled 2015-03-27 (×3): qty 2
  Filled 2015-03-27 (×2): qty 3
  Filled 2015-03-27: qty 5
  Filled 2015-03-27: qty 3
  Filled 2015-03-27: qty 5
  Filled 2015-03-27 (×3): qty 3

## 2015-03-27 MED ORDER — LINAGLIPTIN 5 MG PO TABS
5.0000 mg | ORAL_TABLET | Freq: Every day | ORAL | Status: DC
  Administered 2015-03-27 – 2015-04-01 (×6): 5 mg via ORAL
  Filled 2015-03-27 (×6): qty 1

## 2015-03-27 MED ORDER — TIOTROPIUM BROMIDE MONOHYDRATE 18 MCG IN CAPS
18.0000 ug | ORAL_CAPSULE | Freq: Every day | RESPIRATORY_TRACT | Status: DC
  Administered 2015-03-28 – 2015-04-01 (×5): 18 ug via RESPIRATORY_TRACT
  Filled 2015-03-27: qty 5

## 2015-03-27 MED ORDER — INSULIN ASPART 100 UNIT/ML ~~LOC~~ SOLN
0.0000 [IU] | Freq: Every day | SUBCUTANEOUS | Status: DC
  Administered 2015-03-28 – 2015-03-29 (×2): 2 [IU] via SUBCUTANEOUS
  Administered 2015-03-31: 3 [IU] via SUBCUTANEOUS
  Filled 2015-03-27: qty 3
  Filled 2015-03-27 (×2): qty 2

## 2015-03-27 MED ORDER — MOMETASONE FURO-FORMOTEROL FUM 200-5 MCG/ACT IN AERO
2.0000 | INHALATION_SPRAY | Freq: Two times a day (BID) | RESPIRATORY_TRACT | Status: DC
  Administered 2015-03-27 – 2015-04-01 (×10): 2 via RESPIRATORY_TRACT
  Filled 2015-03-27: qty 8.8

## 2015-03-27 MED ORDER — SODIUM CHLORIDE 0.9 % IV BOLUS (SEPSIS)
500.0000 mL | Freq: Once | INTRAVENOUS | Status: AC
  Administered 2015-03-27: 500 mL via INTRAVENOUS

## 2015-03-27 NOTE — Anesthesia Postprocedure Evaluation (Signed)
  Anesthesia Post-op Note  Patient: [name]  Procedure(s) Performed: Procedure(s): ARTHROPLASTY BIPOLAR HIP (HEMIARTHROPLASTY) (Left)  Anesthesia type:Spinal  Patient location: PACU  Post pain: Pain level controlled  Post assessment: Post-op Vital signs reviewed, Patient's Cardiovascular Status Stable, Respiratory Function Stable, Patent Airway and No signs of Nausea or vomiting  Post vital signs: Reviewed and stable  Last Vitals:  Filed Vitals:   03/27/15 0900  BP: 94/37  Pulse: 88  Temp: 37.1 C  Resp: 18    Level of consciousness: awake, alert  and patient cooperative  Complications: No apparent anesthesia complications

## 2015-03-27 NOTE — Progress Notes (Signed)
[facility] NAME: [name]    MR#:  [id number]  DATE OF BIRTH:  May 12, 1936  SUBJECTIVE:  CHIEF COMPLAINT:  Pt is c/o pain in the hip. Denies any dizziness. Recently had cataract surgery  REVIEW OF SYSTEMS:  CONSTITUTIONAL: No fever, fatigue or weakness.  EYES: No blurred or double vision.  EARS, NOSE, AND THROAT: No tinnitus or ear pain.  RESPIRATORY: No cough, shortness of breath, wheezing or hemoptysis.  CARDIOVASCULAR: No chest pain, orthopnea, edema.  GASTROINTESTINAL: No nausea, vomiting, diarrhea or abdominal pain.  GENITOURINARY: No dysuria, hematuria.  ENDOCRINE: No polyuria, nocturia,  HEMATOLOGY: No anemia, easy bruising or bleeding SKIN: No rash or lesion. MUSCULOSKELETAL: left hip pain .   NEUROLOGIC: No tingling, numbness, weakness.  PSYCHIATRY: No anxiety or depression.   DRUG ALLERGIES:   Allergies  Allergen Reactions  . Codeine Nausea And Vomiting  . Penicillins Hives  . Sulfa Antibiotics   . Doxycycline Rash  . Erythromycin Rash  . Latex Rash    VITALS:  Blood pressure 151/54, pulse 88, temperature 98.7 F (37.1 C), temperature source Oral, resp. rate 18, height 5\' 4"  (1.626 m), weight 59.376 kg (130 lb 14.4 oz), SpO2 99 %.  PHYSICAL EXAMINATION:  GENERAL:  79 y.o.-year-old patient lying in the bed with no acute distress.  EYES: Pupils equal, round, reactive to light and accommodation. No scleral icterus. Extraocular muscles intact.  HEENT: Head atraumatic, normocephalic. Oropharynx and nasopharynx clear.  NECK:  Supple, no jugular venous distention. No thyroid enlargement, no tenderness.  LUNGS: Normal breath sounds bilaterally, no wheezing, rales,rhonchi or crepitation. No use of accessory muscles of respiration.  CARDIOVASCULAR: S1, S2 normal. No murmurs, rubs, or gallops.  ABDOMEN: Soft, nontender, nondistended. Bowel sounds present. No organomegaly or mass.  EXTREMITIES: Left lower  extremity in a cast .No pedal edema, cyanosis, or clubbing.  NEUROLOGIC: Cranial nerves II through XII are intact. Muscle strength 5/5 in all extremities except lle.  Sensation intact. Gait not checked.  PSYCHIATRIC: The patient is alert and oriented x 3.  SKIN: No obvious rash, lesion, or ulcer.    LABORATORY PANEL:   CBC  Recent Labs Lab 03/27/15 0310  WBC 7.4  HGB 10.5*  HCT 30.9*  PLT 127*   ------------------------------------------------------------------------------------------------------------------  Chemistries   Recent Labs Lab 03/25/15 2316  03/27/15 0310  NA 142  < > 136  K 4.0  < > 3.7  CL 103  < > 105  CO2 33*  < > 26  GLUCOSE 151*  < > 182*  BUN 16  < > 12  CREATININE 0.78  < > 0.67  CALCIUM 9.3  < > 8.0*  AST 20  --   --   ALT 17  --   --   ALKPHOS 67  --   --   BILITOT 0.4  --   --   < > = values in this interval not displayed. ------------------------------------------------------------------------------------------------------------------  Cardiac Enzymes No results for input(s): TROPONINI in the last 168 hours. ------------------------------------------------------------------------------------------------------------------  RADIOLOGY:  Dg Chest 1 View  03/25/2015  CLINICAL DATA:  Left hip injury after a fall. History of COPD on oxygen. EXAM: CHEST 1 VIEW COMPARISON:  10/21/2013 FINDINGS: Diffuse emphysematous changes in the chest. Normal heart size and pulmonary vascularity. No focal airspace disease or consolidation in the lungs. No blunting of costophrenic angles. No pneumothorax. Mediastinal contours appear intact. Calcification of the aorta. IMPRESSION: Emphysematous changes in the lungs. No evidence  of active pulmonary disease. Electronically Signed   By: Lucienne Capers M.D.   On: 03/25/2015 23:55   Dg Hip Unilat With Pelvis 1v Left  03/26/2015  CLINICAL DATA:  Postoperative left hip hemiarthroplasty. EXAM: DG HIP (WITH OR WITHOUT  PELVIS) 1V*L* COMPARISON:  None. FINDINGS: Left hip hemi arthroplasty identified without subluxation or dislocation. No acute bony abnormalities are present. No hardware complicating features are noted. Soft tissue postoperative changes are present. IMPRESSION: Left hip hemiarthroplasty without complicating features. Electronically Signed   By: Margarette Canada M.D.   On: 03/26/2015 18:29   Dg Hip Unilat With Pelvis 2-3 Views Left  03/25/2015  CLINICAL DATA:  79 year old female tripped and fell going to the bathroom with left hip pain. Initial encounter. EXAM: DG HIP (WITH OR WITHOUT PELVIS) 2-3V LEFT COMPARISON:  CT Abdomen and Pelvis 02/04/2014 FINDINGS: Comminuted left femoral neck fracture with varus impaction. The intertrochanteric segment appears to remain intact. The left femoral head remains articulating with the acetabulum. Small mildly displaced comminution fragments including that projecting adjacent to the lesser trochanter. Pelvis Intact. Proximal right femur appears intact. Stable small focus of dystrophic calcification in the medial proximal right thigh soft tissues. IMPRESSION: Comminuted left femoral neck fracture with varus impaction. Electronically Signed   By: Genevie Ann M.D.   On: 03/25/2015 23:50    EKG:   Orders placed or performed during the hospital encounter of 03/25/15  . EKG 12-Lead  . EKG 12-Lead    ASSESSMENT AND PLAN:   # Femoral neck fracture, left, closed, initial encounter -   S/p left hip hemiarthroplasty pod #1  Pain rx per ortho recommendations. Needs PT evaluation   # Severe COPD -  No exacerbation continue home inhalers Dulera, Spiriva, and albuterol nebulizer as needed.   O2 via nasal cannula  # Diabetes mellitus, type 2 ([place]) -  on Januvia at home. We will resume Januvia  sliding scale insulin with  fingerstick glucose checks every 6 hours    carb modified diet. Check hemoglobin A1c in a.m.   # Anxiety - Continue home meds   #GERD  (gastroesophageal reflux disease) - PPI while here   All the records are reviewed and case discussed with ED provider. Management plans discussed with the patient and/or family.  DVT PROPHYLAXIS: On Lovenox 30 mg subcutaneous twice a day      All the records are reviewed and case discussed with Care Management/Social Workerr. Management plans discussed with the patient, family and they are in agreement.  CODE STATUS: full code  TOTAL TIME TAKING CARE OF THIS PATIENT: 35 minutes.   POSSIBLE D/C IN 2-3 DAYS, DEPENDING ON CLINICAL CONDITION.   Nicholes Mango M.D on 03/27/2015 at 2:49 PM  Between 7am to 6pm - Pager - [phone] After 6pm go to [email or web address] - password [email or web address] [facility]  Office  [phone]  CC: Primary care physician; Halina Maidens, MD

## 2015-03-27 NOTE — Progress Notes (Signed)
Subjective:  Postoperative day #1 status post left hip hemiarthroplasty. Patient reports pain as moderate.  Her family is at the bedside. Patient sitting upright in bed with Buck's traction on the left lower extremity.  Objective:   VITALS:   Filed Vitals:   03/26/15 2034 03/26/15 2151 03/27/15 0413 03/27/15 0900  BP: 125/56 105/41 105/45 94/37  Pulse: 87 79 90 88  Temp: 98.9 F (37.2 C) 98.5 F (36.9 C) 98 F (36.7 C) 98.7 F (37.1 C)  TempSrc: Oral Oral Oral Oral  Resp: 20 18 18 18   Height:      Weight:      SpO2: 100% 100% 99% 99%    PHYSICAL EXAM:  Left lower extremity: Patient's bandages clean dry and intact. Her thigh compartments are soft and compressible as well as her lower leg compartments. She is wearing TED stockings and has no calf tenderness. She has intact sensation to light touch in palpable pedal pulses. She has intact motor function including flexion-extension of her toes and dorsiflex and plantar flexion of her ankle.   LABS  Results for orders placed or performed during the hospital encounter of 03/25/15 (from the past 24 hour(s))  Glucose, capillary     Status: Abnormal   Collection Time: 03/26/15 10:34 PM  Result Value Ref Range   Glucose-Capillary 223 (H) 65 - 99 mg/dL   Comment 1 Notify RN   CBC     Status: Abnormal   Collection Time: 03/27/15  3:10 AM  Result Value Ref Range   WBC 7.4 3.6 - 11.0 K/uL   RBC 3.27 (L) 3.80 - 5.20 MIL/uL   Hemoglobin 10.5 (L) 12.0 - 16.0 g/dL   HCT 30.9 (L) 35.0 - 47.0 %   MCV 94.6 80.0 - 100.0 fL   MCH 32.1 26.0 - 34.0 pg   MCHC 33.9 32.0 - 36.0 g/dL   RDW 12.6 11.5 - 14.5 %   Platelets 127 (L) 150 - 440 K/uL  Basic metabolic panel     Status: Abnormal   Collection Time: 03/27/15  3:10 AM  Result Value Ref Range   Sodium 136 135 - 145 mmol/L   Potassium 3.7 3.5 - 5.1 mmol/L   Chloride 105 101 - 111 mmol/L   CO2 26 22 - 32 mmol/L   Glucose, Bld 182 (H) 65 - 99 mg/dL   BUN 12 6 - 20 mg/dL   Creatinine,  Ser 0.67 0.44 - 1.00 mg/dL   Calcium 8.0 (L) 8.9 - 10.3 mg/dL   GFR calc non Af Amer >60 >60 mL/min   GFR calc Af Amer >60 >60 mL/min   Anion gap 5 5 - 15  Glucose, capillary     Status: Abnormal   Collection Time: 03/27/15  7:25 AM  Result Value Ref Range   Glucose-Capillary 197 (H) 65 - 99 mg/dL   Comment 1 Notify RN   Glucose, capillary     Status: Abnormal   Collection Time: 03/27/15 10:02 AM  Result Value Ref Range   Glucose-Capillary 232 (H) 65 - 99 mg/dL   Comment 1 Notify RN     Dg Chest 1 View  03/25/2015  CLINICAL DATA:  Left hip injury after a fall. History of COPD on oxygen. EXAM: CHEST 1 VIEW COMPARISON:  10/21/2013 FINDINGS: Diffuse emphysematous changes in the chest. Normal heart size and pulmonary vascularity. No focal airspace disease or consolidation in the lungs. No blunting of costophrenic angles. No pneumothorax. Mediastinal contours appear intact. Calcification of the aorta. IMPRESSION:  Emphysematous changes in the lungs. No evidence of active pulmonary disease. Electronically Signed   By: Lucienne Capers M.D.   On: 03/25/2015 23:55   Dg Hip Unilat With Pelvis 1v Left  03/26/2015  CLINICAL DATA:  Postoperative left hip hemiarthroplasty. EXAM: DG HIP (WITH OR WITHOUT PELVIS) 1V*L* COMPARISON:  None. FINDINGS: Left hip hemi arthroplasty identified without subluxation or dislocation. No acute bony abnormalities are present. No hardware complicating features are noted. Soft tissue postoperative changes are present. IMPRESSION: Left hip hemiarthroplasty without complicating features. Electronically Signed   By: Margarette Canada M.D.   On: 03/26/2015 18:29   Dg Hip Unilat With Pelvis 2-3 Views Left  03/25/2015  CLINICAL DATA:  79 year old female tripped and fell going to the bathroom with left hip pain. Initial encounter. EXAM: DG HIP (WITH OR WITHOUT PELVIS) 2-3V LEFT COMPARISON:  CT Abdomen and Pelvis 02/04/2014 FINDINGS: Comminuted left femoral neck fracture with varus  impaction. The intertrochanteric segment appears to remain intact. The left femoral head remains articulating with the acetabulum. Small mildly displaced comminution fragments including that projecting adjacent to the lesser trochanter. Pelvis Intact. Proximal right femur appears intact. Stable small focus of dystrophic calcification in the medial proximal right thigh soft tissues. IMPRESSION: Comminuted left femoral neck fracture with varus impaction. Electronically Signed   By: Genevie Ann M.D.   On: 03/25/2015 23:50    Assessment/Plan: 1 Day Post-Op   Principal Problem:   Femoral neck fracture, left, closed, initial encounter Active Problems:   COPD, severe ([place])   Diabetes mellitus, type 2 ([place])   GERD (gastroesophageal reflux disease)   Anxiety  Patient: Postoperative day 1. The Foley catheter has been removed. She'll begin anticoagulation therapy today. She'll complete 24 hours of postop antibiotics.  Patient will begin physical therapy. She is on posterior precautions and partial weightbearing on the left lower extremity. Encourage patient to use incentive spirometry while awake. Patient's systolic blood pressure is in the 90s. She would benefit from a fluid bolus. Hemoglobin and hematocrit are within normal limits. Recheck labs in the a.m.    [place] , MD 03/27/2015, 1:23 PM

## 2015-03-27 NOTE — Progress Notes (Signed)
Physical Therapy Evaluation Patient Details Name: Jay Kempe MRN: [id number] DOB: April 14, 1936 Today's Date: 03/27/2015   History of Present Illness  Pt is a 79 yo femle who sustained a femoral neck fracture and now POD #1 L hemi arthroplasty.  Pt lives with her husband in a one story home with 1 step to enter.  Pt needs Mod A +1 to transfer from supine to edge of bed and Mod A +1 for bed to chair transfer.  Educated pt on [place] status and posterior hip precautions.  Recommend SNF at discharge unless pt able to progress with therapy.  Pt would benefit from acute PT service to address objective findings.  Clinical Impression  Pt presents with decreased strength, ROM, and functional mobility secondary to L hip hemi arthroplasty.  Pt's mobility limited by pain and anxiety at this session.  Pt required Mod A for bed mobility and transfers, and is able to transfer from bed to chair with Mod A and extensive verbal cues.  Pt's goal is to return home, but given pt's current level of assistance and decreased attentiveness from spouse recommend SNF for patient safety with all mobility at discharge.    Follow Up Recommendations SNF (May progress to [place])    Equipment Recommendations  Rolling walker with 5" wheels    Recommendations for Other Services OT consult     Precautions / Restrictions Precautions Precautions: Posterior Hip;Fall (hemi) Restrictions Weight Bearing Restrictions: Yes LLE Weight Bearing: Partial weight bearing      Mobility  Bed Mobility Overal bed mobility: Needs Assistance Bed Mobility: Supine to Sit     Supine to sit: Mod assist     General bed mobility comments: Supine>sit with Mod A +1 for L LE management and trunk/hip rotation to edge of bed; scooting  Transfers Overall transfer level: Needs assistance Equipment used: Rolling walker (2 wheeled) Transfers: Sit to/from Stand Sit to Stand: Mod assist         General transfer comment: Sit<>stand  with verbal cues and demonstration for hand placement and hip precautions.  Ambulation/Gait Ambulation/Gait assistance: Mod assist Ambulation Distance (Feet): 2 Feet Assistive device: Rolling walker (2 wheeled) Gait Pattern/deviations:  (heel/toe pivot)     General Gait Details: Poor sequencing of movements with LE and walker; heavy forward lean on elbows, cue to push with hands and to maintain grip on walker (letting go prematurely for chair)  Stairs            Wheelchair Mobility    Modified Rankin (Stroke Patients Only)       Balance Overall balance assessment: Needs assistance             Standing balance comment: Mod A and B UE support for standing                             Pertinent Vitals/Pain Pain Assessment:  (With movement)    Home Living Family/patient expects to be discharged to:: Private residence Living Arrangements: Spouse/significant other   Type of Home: House Home Access: Stairs to enter Entrance Stairs-Rails: None Entrance Stairs-Number of Steps: 1 Home Layout: One level Home Equipment: Bedside commode      Prior Function Level of Independence: Independent               Hand Dominance        Extremity/Trunk Assessment   Upper Extremity Assessment: Overall WFL for tasks assessed  Lower Extremity Assessment: Overall WFL for tasks assessed;LLE deficits/detail   LLE Deficits / Details: grossly 2/5     Communication   Communication: No difficulties  Cognition Arousal/Alertness: Awake/alert Behavior During Therapy: WFL for tasks assessed/performed Overall Cognitive Status: Within Functional Limits for tasks assessed                      General Comments General comments (skin integrity, edema, etc.): visible areas c/d/i    Exercises General Exercises - Lower Extremity Ankle Circles/Pumps: AROM;Both;10 reps Quad Sets: AROM;Both;10 reps Gluteal Sets: AROM;Both;10 reps       Assessment/Plan    PT Assessment Patient needs continued PT services  PT Diagnosis Difficulty walking;Abnormality of gait;Generalized weakness;Acute pain   PT Problem List Decreased strength;Decreased range of motion;Decreased balance;Decreased mobility;Decreased knowledge of use of DME;Decreased knowledge of precautions;Decreased safety awareness;Cardiopulmonary status limiting activity;Pain  PT Treatment Interventions DME instruction;Gait training;Stair training;Functional mobility training;Therapeutic activities;Therapeutic exercise;Balance training;Patient/family education   PT Goals (Current goals can be found in the Care Plan section) Acute Rehab PT Goals Patient Stated Goal: "I want to go home Monday." PT Goal Formulation: With patient Time For Goal Achievement: 04/10/15 Potential to Achieve Goals: Good    Frequency BID   Barriers to discharge Decreased caregiver support      Co-evaluation               End of Session Equipment Utilized During Treatment: Gait belt Activity Tolerance: Other (comment) (SOB) Patient left: in bed;with chair alarm set;with family/visitor present;with call bell/phone within reach Nurse Communication: Mobility status         Time: 1330-1411 PT Time Calculation (min) (ACUTE ONLY): 41 min   Charges:     PT Treatments $Gait Training: 8-22 mins   PT G Codes:        Camran Keady A Laqueta Bonaventura April 13, 2015, 3:39 PM

## 2015-03-27 NOTE — Progress Notes (Signed)
Physical Therapy Treatment Patient Details Name: [name] MRN: [id number] DOB: 1936-01-16 Today's Date: 03/27/2015    History of Present Illness Pt is a 79 yo femle who sustained a femoral neck fracture and now POD #1 L hemi arthroplasty.  Pt lives with her husband in a one story home with 1 step to enter.  Pt needs Mod A +1 to transfer from supine to edge of bed and Mod A +1 for bed to chair transfer.  Educated pt on [place] status and posterior hip precautions.  Recommend SNF at discharge unless pt able to progress with therapy.  Pt would benefit from acute PT service to address objective findings.    PT Comments    Pt c/o nausea this PM session and needing to use the bathroom. Pt performed chair>BSC>bed transfer with Min A and fair recall of precautions and transfer techniques from earlier session.  Cont to progress POC.  Follow Up Recommendations  SNF     Equipment Recommendations  Rolling walker with 5" wheels    Recommendations for Other Services OT consult     Precautions / Restrictions Precautions Precautions: Posterior Hip;Fall Restrictions Weight Bearing Restrictions: Yes LLE Weight Bearing: Partial weight bearing    Mobility  Bed Mobility Overal bed mobility: Needs Assistance Bed Mobility: Supine to Sit     Supine to sit: Mod assist     General bed mobility comments: sit>supine with Min A for L LE management and to scoot up in bed with verbal cues using trapeze  Transfers Overall transfer level: Needs assistance Equipment used: Rolling walker (2 wheeled) Transfers: Sit to/from Stand Sit to Stand: Mod assist         General transfer comment: sit<>stand with Min A for balance and verbal cues for hand placement and sequencing.  Ambulation/Gait Ambulation/Gait assistance: Mod assist Ambulation Distance (Feet): 2 Feet Assistive device: Rolling walker (2 wheeled) Gait Pattern/deviations:  (heel/toe pivot)     General Gait Details: Chair to  [facility] to Bed Min A with verbal cues throughout for sequencing and safety.   Stairs            Wheelchair Mobility    Modified Rankin (Stroke Patients Only)       Balance Overall balance assessment: Needs assistance             Standing balance comment: Mod A and B UE support for standing                    Cognition Arousal/Alertness: Awake/alert Behavior During Therapy: Agitated;Restless Overall Cognitive Status: Within Functional Limits for tasks assessed                      Exercises General Exercises - Lower Extremity Ankle Circles/Pumps: AROM;Both;10 reps Quad Sets: AROM;Both;10 reps Gluteal Sets: AROM;Both;10 reps    General Comments General comments (skin integrity, edema, etc.): visible areas c/d/i      Pertinent Vitals/Pain Pain Assessment: 0-10 Pain Score:  (with movement) Pain Intervention(s): Limited activity within patient's tolerance;Premedicated before session;Repositioned    Home Living Family/patient expects to be discharged to:: Private residence Living Arrangements: Spouse/significant other   Type of Home: House Home Access: Stairs to enter Entrance Stairs-Rails: None Home Layout: One level Home Equipment: Bedside commode      Prior Function Level of Independence: Independent          PT Goals (current goals can now be found in the care plan section) Acute Rehab PT Goals Patient  Stated Goal: "I want to go home Monday." PT Goal Formulation: With patient Time For Goal Achievement: 04/10/15 Potential to Achieve Goals: Good Progress towards PT goals: Progressing toward goals    Frequency  BID    PT Plan Current plan remains appropriate    Co-evaluation             End of Session Equipment Utilized During Treatment: Gait belt Activity Tolerance: Patient limited by fatigue;Patient limited by pain Patient left: in bed;with chair alarm set;with family/visitor present;with call bell/phone within reach      Time: 1720-1745 PT Time Calculation (min) (ACUTE ONLY): 25 min  Charges:  $Gait Training: 8-22 mins $Therapeutic Activity: 8-22 mins                    G Codes:      Setsuko Robins A Lakeyta Vandenheuvel 04/07/15, 5:48 PM

## 2015-03-27 NOTE — Progress Notes (Signed)
PT Cancellation Note  Patient Details Name: [name] MRN: [id number] DOB: January 22, 1936   Cancelled Treatment:    Reason Eval/Treat Not Completed: Other (comment) (need updated activity orders/traction orders)  Spoke with RN; will try back again this afternoon after updated.    Mindie Rawdon A Janeka Libman 03/27/2015, 12:36 PM

## 2015-03-27 NOTE — Progress Notes (Signed)
Patients b/p up to 151/54 after bolus.

## 2015-03-27 NOTE — Progress Notes (Signed)
Spoke to Dr. Margaretmary Eddy regarding patients low b/p. She said she would put in an order for 559ml bolus.

## 2015-03-27 NOTE — Anesthesia Post-op Follow-up Note (Signed)
  Anesthesia Pain Follow-up Note  Patient: [name]  Day #: 1  Date of Follow-up: 03/27/2015 Time: 12:25 PM  Last Vitals:  Filed Vitals:   03/27/15 0900  BP: 94/37  Pulse: 88  Temp: 37.1 C  Resp: 18    Level of Consciousness: alert  Pain: moderate   Side Effects:None  Catheter Site Exam:clean, dry  Plan: Continue current therapy and D/C from anesthesia care  Martha Clan

## 2015-03-28 LAB — GLUCOSE, CAPILLARY
GLUCOSE-CAPILLARY: 172 mg/dL — AB (ref 65–99)
GLUCOSE-CAPILLARY: 243 mg/dL — AB (ref 65–99)
GLUCOSE-CAPILLARY: 239 mg/dL — AB (ref 65–99)
Glucose-Capillary: 225 mg/dL — ABNORMAL HIGH (ref 65–99)
Glucose-Capillary: 266 mg/dL — ABNORMAL HIGH (ref 65–99)
Glucose-Capillary: 242 mg/dL — ABNORMAL HIGH (ref 65–99)

## 2015-03-28 LAB — BASIC METABOLIC PANEL
ANION GAP: 4 — AB (ref 5–15)
BUN: 11 mg/dL (ref 6–20)
CALCIUM: 8.4 mg/dL — AB (ref 8.9–10.3)
CO2: 28 mmol/L (ref 22–32)
CREATININE: 0.6 mg/dL (ref 0.44–1.00)
Chloride: 109 mmol/L (ref 101–111)
Glucose, Bld: 186 mg/dL — ABNORMAL HIGH (ref 65–99)
Potassium: 3.6 mmol/L (ref 3.5–5.1)
SODIUM: 141 mmol/L (ref 135–145)

## 2015-03-28 LAB — CBC
HCT: 28.9 % — ABNORMAL LOW (ref 35.0–47.0)
Hemoglobin: 9.9 g/dL — ABNORMAL LOW (ref 12.0–16.0)
MCH: 32.1 pg (ref 26.0–34.0)
MCHC: 34.2 g/dL (ref 32.0–36.0)
MCV: 93.6 fL (ref 80.0–100.0)
PLATELETS: 118 10*3/uL — AB (ref 150–440)
RBC: 3.08 MIL/uL — ABNORMAL LOW (ref 3.80–5.20)
RDW: 12.7 % (ref 11.5–14.5)
WBC: 8.3 10*3/uL (ref 3.6–11.0)

## 2015-03-28 LAB — HEMOGLOBIN A1C: Hgb A1c MFr Bld: 6.9 % — ABNORMAL HIGH (ref 4.0–6.0)

## 2015-03-28 MED ORDER — DILTIAZEM HCL 100 MG IV SOLR
5.0000 mg/h | INTRAVENOUS | Status: DC
  Administered 2015-03-28: 5 mg/h via INTRAVENOUS
  Filled 2015-03-28: qty 100

## 2015-03-28 MED ORDER — DILTIAZEM HCL 25 MG/5ML IV SOLN
10.0000 mg | Freq: Once | INTRAVENOUS | Status: AC
  Administered 2015-03-28: 10 mg via INTRAVENOUS
  Filled 2015-03-28: qty 5

## 2015-03-28 NOTE — Progress Notes (Signed)
[facility] [place] NAME: [name]    MR#:  [id number]  DATE OF BIRTH:  03/25/1936  SUBJECTIVE:  CHIEF COMPLAINT:  Pt is c/o pain in the hip. working with PT .Recently had cataract surgery  REVIEW OF SYSTEMS:  CONSTITUTIONAL: No fever, fatigue or weakness.  EYES: No blurred or double vision.  EARS, NOSE, AND THROAT: No tinnitus or ear pain.  RESPIRATORY: No cough, shortness of breath, wheezing or hemoptysis.  CARDIOVASCULAR: No chest pain, orthopnea, edema.  GASTROINTESTINAL: No nausea, vomiting, diarrhea or abdominal pain.  GENITOURINARY: No dysuria, hematuria.  ENDOCRINE: No polyuria, nocturia,  HEMATOLOGY: No anemia, easy bruising or bleeding SKIN: No rash or lesion. MUSCULOSKELETAL: left hip pain .   NEUROLOGIC: No tingling, numbness, weakness.  PSYCHIATRY: No anxiety or depression.   DRUG ALLERGIES:   Allergies  Allergen Reactions  . Codeine Nausea And Vomiting  . Penicillins Hives  . Sulfa Antibiotics   . Doxycycline Rash  . Erythromycin Rash  . Latex Rash    VITALS:  Blood pressure 129/70, pulse 178, temperature 98.2 F (36.8 C), temperature source Oral, resp. rate 18, height 5\' 4"  (1.626 m), weight 59.376 kg (130 lb 14.4 oz), SpO2 100 %.  PHYSICAL EXAMINATION:  GENERAL:  79 y.o.-year-old patient lying in the bed with no acute distress.  EYES: Pupils equal, round, reactive to light and accommodation. No scleral icterus. Extraocular muscles intact.  HEENT: Head atraumatic, normocephalic. Oropharynx and nasopharynx clear.  NECK:  Supple, no jugular venous distention. No thyroid enlargement, no tenderness.  LUNGS: Normal breath sounds bilaterally, no wheezing, rales,rhonchi or crepitation. No use of accessory muscles of respiration.  CARDIOVASCULAR: S1, S2 normal. No murmurs, rubs, or gallops.  ABDOMEN: Soft, nontender, nondistended. Bowel sounds present. No organomegaly or mass.  EXTREMITIES: Left lower  extremity in a cast .No pedal edema, cyanosis, or clubbing.  NEUROLOGIC: Cranial nerves II through XII are intact. Muscle strength 5/5 in all extremities except lle.  Sensation intact. Gait not checked.  PSYCHIATRIC: The patient is alert and oriented x 3.  SKIN: No obvious rash, lesion, or ulcer.    LABORATORY PANEL:   CBC  Recent Labs Lab 03/28/15 0353  WBC 8.3  HGB 9.9*  HCT 28.9*  PLT 118*   ------------------------------------------------------------------------------------------------------------------  Chemistries   Recent Labs Lab 03/25/15 2316  03/28/15 0353  NA 142  < > 141  K 4.0  < > 3.6  CL 103  < > 109  CO2 33*  < > 28  GLUCOSE 151*  < > 186*  BUN 16  < > 11  CREATININE 0.78  < > 0.60  CALCIUM 9.3  < > 8.4*  AST 20  --   --   ALT 17  --   --   ALKPHOS 67  --   --   BILITOT 0.4  --   --   < > = values in this interval not displayed. ------------------------------------------------------------------------------------------------------------------  Cardiac Enzymes No results for input(s): TROPONINI in the last 168 hours. ------------------------------------------------------------------------------------------------------------------  RADIOLOGY:  No results found.  EKG:   Orders placed or performed during the hospital encounter of 03/25/15  . EKG 12-Lead  . EKG 12-Lead  . EKG 12-Lead  . EKG 12-Lead    ASSESSMENT AND PLAN:   # Femoral neck fracture, left, closed, initial encounter -   S/p left hip hemiarthroplasty pod #2 Pain rx per ortho recommendations. F/U with PT    # Severe COPD -  No exacerbation continue home inhalers Dulera, Spiriva, and albuterol nebulizer as needed.   O2 via nasal cannula  # Diabetes mellitus, type 2 ([place]) -  on Januvia at home. resume Januvia  sliding scale insulin with  fingerstick glucose checks every 6 hours    carb modified diet.  hemoglobin A1c 6.9   # Anxiety - Continue home meds   #GERD  (gastroesophageal reflux disease) - PPI while here   All the records are reviewed and case discussed with ED provider. Management plans discussed with the patient and/or family.  DVT PROPHYLAXIS: On Lovenox 30 mg subcutaneous twice a day      All the records are reviewed and case discussed with Care Management/Social Workerr. Management plans discussed with the patient, family and they are in agreement.  CODE STATUS: full code  TOTAL TIME TAKING CARE OF THIS PATIENT: 35 minutes.   POSSIBLE D/C IN 1-2 DAYS, DEPENDING ON CLINICAL CONDITION.   Nicholes Mango M.D on 03/28/2015 at 10:55 PM  Between 7am to 6pm - Pager - [phone] After 6pm go to [email or web address] - password [email or web address] [facility]  Office  [phone]  CC: Primary care physician; Halina Maidens, MD

## 2015-03-28 NOTE — Progress Notes (Signed)
MD made aware that dose of cardizem 10mg  iv not effective. Ordered to adm 10mg  more IV

## 2015-03-28 NOTE — Progress Notes (Signed)
Subjective:  Postoperative day #2 status post left hip hemiarthroplasty for fracture. Patient reports pain as mild.  Patient is sitting up in bed. She states that she was able to walk to the doorway today. She has also used bedside commode. Patient has noted some difficulty swallowing and the nurses placing her pills in applesauce.  Objective:   VITALS:   Filed Vitals:   03/27/15 1618 03/27/15 2101 03/28/15 0403 03/28/15 0811  BP: 139/49 126/56 130/36 117/53  Pulse: 100 95 68 94  Temp: 99.1 F (37.3 C) 98.3 F (36.8 C) 98 F (36.7 C) 98.4 F (36.9 C)  TempSrc: Oral Oral Oral Oral  Resp:  18 18 18   Height:      Weight:      SpO2: 100% 98% 100% 100%    PHYSICAL EXAM:  Left hip dressing is clean dry and intact. She has no erythema or significant ecchymosis or swelling. Her thigh compartments are soft and compressible. She has intact sensation light touch and palpable pedal pulses in the left lower extremity. She can flex and extend her toes and dorsiflex and plantarflex her left ankle. She is wearing TED stockings.   LABS  Results for orders placed or performed during the hospital encounter of 03/25/15 (from the past 24 hour(s))  Glucose, capillary     Status: Abnormal   Collection Time: 03/27/15  4:11 PM  Result Value Ref Range   Glucose-Capillary 234 (H) 65 - 99 mg/dL   Comment 1 Notify RN   Glucose, capillary     Status: Abnormal   Collection Time: 03/27/15  9:34 PM  Result Value Ref Range   Glucose-Capillary 170 (H) 65 - 99 mg/dL  CBC     Status: Abnormal   Collection Time: 03/28/15  3:53 AM  Result Value Ref Range   WBC 8.3 3.6 - 11.0 K/uL   RBC 3.08 (L) 3.80 - 5.20 MIL/uL   Hemoglobin 9.9 (L) 12.0 - 16.0 g/dL   HCT 28.9 (L) 35.0 - 47.0 %   MCV 93.6 80.0 - 100.0 fL   MCH 32.1 26.0 - 34.0 pg   MCHC 34.2 32.0 - 36.0 g/dL   RDW 12.7 11.5 - 14.5 %   Platelets 118 (L) 150 - 440 K/uL  Basic metabolic panel     Status: Abnormal   Collection Time: 03/28/15  3:53 AM   Result Value Ref Range   Sodium 141 135 - 145 mmol/L   Potassium 3.6 3.5 - 5.1 mmol/L   Chloride 109 101 - 111 mmol/L   CO2 28 22 - 32 mmol/L   Glucose, Bld 186 (H) 65 - 99 mg/dL   BUN 11 6 - 20 mg/dL   Creatinine, Ser 0.60 0.44 - 1.00 mg/dL   Calcium 8.4 (L) 8.9 - 10.3 mg/dL   GFR calc non Af Amer >60 >60 mL/min   GFR calc Af Amer >60 >60 mL/min   Anion gap 4 (L) 5 - 15  Glucose, capillary     Status: Abnormal   Collection Time: 03/28/15  4:01 AM  Result Value Ref Range   Glucose-Capillary 172 (H) 65 - 99 mg/dL   Comment 1 Notify RN   Glucose, capillary     Status: Abnormal   Collection Time: 03/28/15  8:57 AM  Result Value Ref Range   Glucose-Capillary 225 (H) 65 - 99 mg/dL   Comment 1 Notify RN   Glucose, capillary     Status: Abnormal   Collection Time: 03/28/15 11:11 AM  Result  Value Ref Range   Glucose-Capillary 266 (H) 65 - 99 mg/dL   Comment 1 Notify RN     Dg Hip Unilat With Pelvis 1v Left  03/26/2015  CLINICAL DATA:  Postoperative left hip hemiarthroplasty. EXAM: DG HIP (WITH OR WITHOUT PELVIS) 1V*L* COMPARISON:  None. FINDINGS: Left hip hemi arthroplasty identified without subluxation or dislocation. No acute bony abnormalities are present. No hardware complicating features are noted. Soft tissue postoperative changes are present. IMPRESSION: Left hip hemiarthroplasty without complicating features. Electronically Signed   By: Margarette Canada M.D.   On: 03/26/2015 18:29    Assessment/Plan: 2 Days Post-Op   Principal Problem:   Femoral neck fracture, left, closed, initial encounter Active Problems:   COPD, severe ([place])   Diabetes mellitus, type 2 ([place])   GERD (gastroesophageal reflux disease)   Anxiety  The patient doing well postop. Continue physical therapy. Recheck labs in the a.m. Continue Lovenox for DVT prophylaxis. Will order swallowing evaluation.    Thornton Park , MD 03/28/2015, 1:21 PM

## 2015-03-28 NOTE — Progress Notes (Signed)
Dr. Jannifer Franklin notified of second 10mg  cardizem not effective. MD coming to see pt

## 2015-03-28 NOTE — Progress Notes (Signed)
Physical Therapy Treatment Patient Details Name: [name] MRN: [id number] DOB: [birth year] Today's Date: 03/28/2015    History of Present Illness Pt is a 79 yo femle who sustained a femoral neck fracture and now POD #2 L hemi arthroplasty.      PT Comments    Pt required extra time and redirection to task throughout session today.  Pt is limited by pain and L hip weakness and SOB with ambulation.  Pt able to walk 5' x 2 PWB on 2 L O2 this session with fair recall of hip precautions and continuous cues for sequencing, WB stauts, and to increase step length.  Pt assisted to [facility] and needed CGA for transfers on/off commode.  Pt would benefit from SNF at discharge.  Cont with POC.   Follow Up Recommendations  SNF     Equipment Recommendations  Rolling walker with 5" wheels    Recommendations for Other Services OT consult     Precautions / Restrictions Precautions Precautions: Posterior Hip;Fall Restrictions LLE Weight Bearing: Partial weight bearing    Mobility  Bed Mobility               General bed mobility comments: supine to sit with HOB elevated 30degrees exiting to R side using rails and Mod A to elevate trunk to midline and for L LE management.  Transfers                 General transfer comment: sit<>stand with verbal cues for sequencing, safety, and hip precautions and Min A for balance and safety.  Ambulation/Gait             General Gait Details: Pt amb 5' x2 with RW and Min A with verbal cues for sequencing and safety with RW.  Pt toe walks on L lef, unable to tolerate PWB at this time due to pain.  Increased pain with side stepping especially L hip abduction.   Stairs            Wheelchair Mobility    Modified Rankin (Stroke Patients Only)       Balance               Standing balance comment: Min A with B UE support                    Cognition Arousal/Alertness: Awake/alert Behavior During Therapy: WFL  for tasks assessed/performed Overall Cognitive Status: Within Functional Limits for tasks assessed (anxious)                      Exercises General Exercises - Lower Extremity Ankle Circles/Pumps: AROM;Both;10 reps Quad Sets: AROM;Both;10 reps Gluteal Sets: AROM;Both;10 reps Short Arc Quad: AROM;Both;10 reps Heel Slides: AROM;Strengthening;Both;10 reps Hip ABduction/ADduction: AAROM;Both;10 reps;Supine    General Comments        Pertinent Vitals/Pain Pain Assessment: 0-10 Pain Score: 6  Pain Intervention(s): Limited activity within patient's tolerance;Monitored during session;Premedicated before session    Home Living                      Prior Function            PT Goals (current goals can now be found in the care plan section) Acute Rehab PT Goals Patient Stated Goal: "I want to go home Monday." PT Goal Formulation: With patient Time For Goal Achievement: 04/10/15 Potential to Achieve Goals: Good Progress towards PT goals: Progressing toward goals    Frequency  BID    PT Plan Current plan remains appropriate    Co-evaluation             End of Session Equipment Utilized During Treatment: Gait belt Activity Tolerance: Patient limited by fatigue;Patient limited by pain Patient left: in chair;with call bell/phone within reach;with family/visitor present     Time: 8343-7357 PT Time Calculation (min) (ACUTE ONLY): 60 min  Charges:  $Gait Training: 8-22 mins $Therapeutic Exercise: 23-37 mins $Therapeutic Activity: 8-22 mins                    G Codes:      [name] A Erle Guster 2015-04-25, 12:16 PM

## 2015-03-28 NOTE — Progress Notes (Signed)
RT notified of need for stat ekg

## 2015-03-28 NOTE — Progress Notes (Addendum)
  Report given to ccu nurse Broadus John at 2157

## 2015-03-28 NOTE — Care Management Note (Signed)
Case Management Note  Patient Details  Name: [name] MRN: [id number] Date of Birth: [birth year]  Subjective/Objective:     Discussed discharge planning with [name] who is adamant that she will be going to Rehab. She reports that her husband is unable to provide assistance at home, and that she has discussed rehab with her physician. She has used [facility] in the past. Updated Toma Copier, CSW, that [name] is requesting Rehab.                Action/Plan:   Expected Discharge Date:                  Expected Discharge Plan:     In-House Referral:  Clinical Social Work  Discharge planning Services  CM Consult  Post Acute Care Choice:  Home Health Choice offered to:  Patient, Spouse  DME Arranged:    DME Agency:     HH Arranged:    HH Agency:     Status of Service:  In process, will continue to follow  Medicare Important Message Given:    Date Medicare IM Given:    Medicare IM give by:    Date Additional Medicare IM Given:    Additional Medicare Important Message give by:     If discussed at [place] of Stay Meetings, dates discussed:    Additional Comments:  Zahniya Zellars A, RN 03/28/2015, 12:45 PM

## 2015-03-28 NOTE — Progress Notes (Signed)
Report called to ccu nurse Aaron Edelman

## 2015-03-28 NOTE — Progress Notes (Signed)
Jannifer Franklin, MD in with patient

## 2015-03-28 NOTE — Clinical Social Work Note (Signed)
Clinical Social Work Assessment  Patient Details  Name: [name] MRN: [id number] Date of Birth: Nov 22, 1935  Date of referral:  03/27/15               Reason for consult:  Facility Placement                Permission sought to share information with:  Facility Sport and exercise psychologist, Family Supports Permission granted to share information::  Yes, Verbal Permission Granted  Name::        Agency::  SNFs  Relationship::  husband and adult children  Contact Information:     Housing/Transportation Living arrangements for the past 2 months:  Single Family Home Source of Information:  Patient, Spouse, Medical Team Patient Interpreter Needed:  None Criminal Activity/Legal Involvement Pertinent to Current Situation/Hospitalization:  No - Comment as needed Significant Relationships:  Adult Children, Spouse Lives with:  Spouse Do you feel safe going back to the place where you live?  No Need for family participation in patient care:  Yes (Comment)  Care giving concerns:  Pt lives with her husband of 11 years. Pt's 4 adult children live out of the area but are supportive.   Social Worker assessment / plan: CSW was referred to Pt to assist with dc planning. Pt is married, lives with her husband. 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 Pt reports that she likes to stay busy and is hating sitting still. CSW discussed discharge recommendation to SNF for STR. CSW answered Pt and her husband's questions. Pt will likely have several choices for rehab but will not be able to decide which facility until Monday. She and her husband are aware of possible dc on Monday afternoon and are in agreement.    Employment status:  Retired Forensic scientist:  [employer] PT Recommendations:  [place] / Referral to community resources:  [place]  Patient/Family's Response to care:  Pt is very pleased  with care, though states that she is having a hard time waiting for the staff to help her when she thinks she can do it herself. Pt is hoping to return home from STR as soon as possible.   Patient/Family's Understanding of and Emotional Response to Diagnosis, Current Treatment, and Prognosis:  Pt is educated in the health care field and is aware of current state. She asks appropriate questions regarding dc planning and demonstrates a fairly good sense of humor about her limitations.   Emotional Assessment Appearance:  Appears stated age Attitude/Demeanor/Rapport:    Affect (typically observed):    Orientation:    Alcohol / Substance use:    Psych involvement (Current and /or in the community):     Discharge Needs  Concerns to be addressed:  Adjustment to Illness, Discharge Planning Concerns Readmission within the last 30 days:  No Current discharge risk:  Dependent with Mobility Barriers to Discharge:  Continued Medical Work up   [employer], LCSW 03/28/2015, 3:35 PM

## 2015-03-28 NOTE — Progress Notes (Signed)
Called by nursing pt stated she didn't feel well, on vital had HR 140s.  Lasted a few minutes and resolved.  Called again shortly HR back in 140-150s.  Gave IV cardizem x2 with no effect.  On EKG hard to detect definitive p wave.  Suspect afib rvr/flutter.  Will start cardizem infusion and move to stepdown.  Jacqulyn Bath [facility] 03/28/2015, 9:46 PM

## 2015-03-28 NOTE — Clinical Social Work Placement (Signed)
   CLINICAL SOCIAL WORK PLACEMENT  NOTE  Date:  03/28/2015  Patient Details  Name: [name] MRN: [id number] Date of Birth: [birth year]  Clinical Social Work is seeking post-discharge placement for this patient at the [place] level of care (*CSW will initial, date and re-position this form in  chart as items are completed):  Yes   Patient/family provided with [facility] Work Department's list of facilities offering this level of care within the geographic area requested by the patient (or if unable, by the patient's family).  Yes   Patient/family informed of their freedom to choose among providers that offer the needed level of care, that participate in Medicare, Medicaid or managed care program needed by the patient, have an available bed and are willing to accept the patient.  Yes   Patient/family informed of [facility]'s ownership interest in [facility] and [facility], as well as of the fact that they are under no obligation to receive care at these facilities.  PASRR submitted to EDS on       PASRR number received on       Existing PASRR number confirmed on       FL2 transmitted to all facilities in geographic area requested by pt/family on 03/28/15     FL2 transmitted to all facilities within larger geographic area on       Patient informed that his/her managed care company has contracts with or will negotiate with certain facilities, including the following:            Patient/family informed of bed offers received.  Patient chooses bed at       Physician recommends and patient chooses bed at      Patient to be transferred to   on  .  Patient to be transferred to facility by       Patient family notified on   of transfer.  Name of family member notified:        PHYSICIAN Please sign FL2, Please prepare prescriptions, Please prepare priority discharge summary, including medications     Additional Comment:     _______________________________________________ Alonna Buckler, LCSW 03/28/2015, 3:40 PM

## 2015-03-29 ENCOUNTER — Encounter: Payer: Self-pay | Admitting: Specialist

## 2015-03-29 ENCOUNTER — Inpatient Hospital Stay (HOSPITAL_COMMUNITY)
Admit: 2015-03-29 | Discharge: 2015-03-29 | Disposition: A | Discharge status: Home / Self Care | Payer: Medicare Other | Attending: Physician Assistant | Admitting: Physician Assistant

## 2015-03-29 DIAGNOSIS — I480 Paroxysmal atrial fibrillation: Secondary | ICD-10-CM

## 2015-03-29 DIAGNOSIS — I4891 Unspecified atrial fibrillation: Secondary | ICD-10-CM

## 2015-03-29 LAB — BASIC METABOLIC PANEL
Anion gap: 5 (ref 5–15)
BUN: 10 mg/dL (ref 6–20)
CO2: 29 mmol/L (ref 22–32)
Calcium: 8.5 mg/dL — ABNORMAL LOW (ref 8.9–10.3)
Chloride: 103 mmol/L (ref 101–111)
Creatinine, Ser: 0.62 mg/dL (ref 0.44–1.00)
GFR calc Af Amer: >60 mL/min (ref >60)
GFR calc non Af Amer: >60 mL/min (ref >60)
Glucose, Bld: 228 mg/dL — ABNORMAL HIGH (ref 65–99)
Potassium: 3.9 mmol/L (ref 3.5–5.1)
Sodium: 137 mmol/L (ref 135–145)

## 2015-03-29 LAB — CBC
HEMATOCRIT: 27 % — AB (ref 35.0–47.0)
Hemoglobin: 9 g/dL — ABNORMAL LOW (ref 12.0–16.0)
MCH: 31 pg (ref 26.0–34.0)
MCHC: 33.4 g/dL (ref 32.0–36.0)
MCV: 92.8 fL (ref 80.0–100.0)
Platelets: 122 10*3/uL — ABNORMAL LOW (ref 150–440)
RBC: 2.91 MIL/uL — ABNORMAL LOW (ref 3.80–5.20)
RDW: 12.7 % (ref 11.5–14.5)
WBC: 7.5 10*3/uL (ref 3.6–11.0)

## 2015-03-29 LAB — MAGNESIUM: Magnesium: 1.7 mg/dL (ref 1.7–2.4)

## 2015-03-29 LAB — GLUCOSE, CAPILLARY
GLUCOSE-CAPILLARY: 239 mg/dL — AB (ref 65–99)
Glucose-Capillary: 192 mg/dL — ABNORMAL HIGH (ref 65–99)
Glucose-Capillary: 196 mg/dL — ABNORMAL HIGH (ref 65–99)
Glucose-Capillary: 196 mg/dL — ABNORMAL HIGH (ref 65–99)

## 2015-03-29 LAB — MRSA PCR SCREENING: MRSA by PCR: NEGATIVE

## 2015-03-29 MED ORDER — INSULIN GLARGINE 100 UNIT/ML ~~LOC~~ SOLN
12.0000 [IU] | Freq: Every day | SUBCUTANEOUS | Status: DC
  Filled 2015-03-29: qty 0.12

## 2015-03-29 MED ORDER — NYSTATIN 100000 UNIT/ML MT SUSP
5.0000 mL | Freq: Four times a day (QID) | OROMUCOSAL | Status: DC
  Administered 2015-03-29 – 2015-04-01 (×12): 500000 [IU] via OROMUCOSAL
  Filled 2015-03-29 (×15): qty 5

## 2015-03-29 MED ORDER — INSULIN GLARGINE 100 UNIT/ML ~~LOC~~ SOLN
12.0000 [IU] | Freq: Every day | SUBCUTANEOUS | Status: DC
  Administered 2015-03-29 – 2015-04-01 (×4): 12 [IU] via SUBCUTANEOUS
  Filled 2015-03-29 (×4): qty 0.12

## 2015-03-29 MED ORDER — RIVAROXABAN 20 MG PO TABS
20.0000 mg | ORAL_TABLET | Freq: Every day | ORAL | Status: DC
  Administered 2015-03-29 – 2015-04-01 (×4): 20 mg via ORAL
  Filled 2015-03-29 (×2): qty 1
  Filled 2015-03-29: qty 2
  Filled 2015-03-29: qty 1

## 2015-03-29 MED ORDER — METOPROLOL TARTRATE 25 MG PO TABS
25.0000 mg | ORAL_TABLET | Freq: Two times a day (BID) | ORAL | Status: DC
  Administered 2015-03-29 – 2015-04-01 (×6): 25 mg via ORAL
  Filled 2015-03-29 (×6): qty 1

## 2015-03-29 MED ORDER — MOXIFLOXACIN HCL 0.5 % OP SOLN
1.0000 [drp] | Freq: Four times a day (QID) | OPHTHALMIC | Status: DC
  Administered 2015-03-30 – 2015-04-01 (×9): 1 [drp] via OPHTHALMIC
  Filled 2015-03-29: qty 3

## 2015-03-29 MED ORDER — METOPROLOL TARTRATE 25 MG PO TABS
12.5000 mg | ORAL_TABLET | Freq: Two times a day (BID) | ORAL | Status: DC
  Administered 2015-03-29: 12.5 mg via ORAL
  Filled 2015-03-29: qty 1

## 2015-03-29 MED ORDER — FAMOTIDINE 20 MG PO TABS
20.0000 mg | ORAL_TABLET | Freq: Two times a day (BID) | ORAL | Status: DC
  Administered 2015-03-29 – 2015-04-01 (×7): 20 mg via ORAL
  Filled 2015-03-29 (×7): qty 1

## 2015-03-29 NOTE — Progress Notes (Addendum)
[facility] NAME: [name]    MR#:  [id number]  DATE OF BIRTH:  May 13, 1936  SUBJECTIVE:  CHIEF COMPLAINT:  Pt is resting comfortably. Went into afib with RVR last night, moved to ICU . On Cardizem drip for intermittent episodes of A. fib .Recently had cataract surgery  REVIEW OF SYSTEMS:  CONSTITUTIONAL: No fever, fatigue or weakness.  EYES: No blurred or double vision.  EARS, NOSE, AND THROAT: No tinnitus or ear pain.  RESPIRATORY: No cough, shortness of breath, wheezing or hemoptysis.  CARDIOVASCULAR: Palpitations are improved. No chest pain, orthopnea, edema.  GASTROINTESTINAL: No nausea, vomiting, diarrhea or abdominal pain.  GENITOURINARY: No dysuria, hematuria.  ENDOCRINE: No polyuria, nocturia,  HEMATOLOGY: No anemia, easy bruising or bleeding SKIN: No rash or lesion. MUSCULOSKELETAL: left hip pain .   NEUROLOGIC: No tingling, numbness, weakness.  PSYCHIATRY: No anxiety or depression.   DRUG ALLERGIES:   Allergies  Allergen Reactions  . Codeine Nausea And Vomiting  . Penicillins Hives  . Sulfa Antibiotics   . Doxycycline Rash  . Erythromycin Rash  . Latex Rash    VITALS:  Blood pressure 113/57, pulse 99, temperature 99.3 F (37.4 C), temperature source Oral, resp. rate 21, height 5\' 4"  (1.626 m), weight 59.376 kg (130 lb 14.4 oz), SpO2 96 %.  PHYSICAL EXAMINATION:  GENERAL:  79 y.o.-year-old patient lying in the bed with no acute distress.  EYES: Pupils equal, round, reactive to light and accommodation. No scleral icterus. Extraocular muscles intact.  HEENT: Head atraumatic, normocephalic. Oropharynx and nasopharynx clear.  NECK:  Supple, no jugular venous distention. No thyroid enlargement, no tenderness.  LUNGS: Normal breath sounds bilaterally, no wheezing, rales,rhonchi or crepitation. No use of accessory muscles of respiration.  CARDIOVASCULAR: S1, S2 normal. No murmurs, rubs, or gallops.   ABDOMEN: Soft, nontender, nondistended. Bowel sounds present. No organomegaly or mass.  EXTREMITIES: Left lower extremity in a cast .No pedal edema, cyanosis, or clubbing.  NEUROLOGIC: Cranial nerves II through XII are intact. Muscle strength 5/5 in all extremities except lle, with clean dressing.  Sensation intact. Gait not checked.  PSYCHIATRIC: The patient is alert and oriented x 3.  SKIN: No obvious rash, lesion, or ulcer.    LABORATORY PANEL:   CBC  Recent Labs Lab 03/29/15 0404  WBC 7.5  HGB 9.0*  HCT 27.0*  PLT 122*   ------------------------------------------------------------------------------------------------------------------  Chemistries   Recent Labs Lab 03/25/15 2316  03/29/15 0929  NA 142  < > 137  K 4.0  < > 3.9  CL 103  < > 103  CO2 33*  < > 29  GLUCOSE 151*  < > 228*  BUN 16  < > 10  CREATININE 0.78  < > 0.62  CALCIUM 9.3  < > 8.5*  MG  --   --  1.7  AST 20  --   --   ALT 17  --   --   ALKPHOS 67  --   --   BILITOT 0.4  --   --   < > = values in this interval not displayed. ------------------------------------------------------------------------------------------------------------------  Cardiac Enzymes No results for input(s): TROPONINI in the last 168 hours. ------------------------------------------------------------------------------------------------------------------  RADIOLOGY:  No results found.  EKG:   Orders placed or performed during the hospital encounter of 03/25/15  . EKG 12-Lead  . EKG 12-Lead  . EKG 12-Lead  . EKG 12-Lead  . EKG 12-Lead  . EKG 12-Lead  . EKG  12-Lead  . EKG 12-Lead    ASSESSMENT AND PLAN:   #New-onset A. fib with RVR We will wean off Cardizem drip Started metoprolol 25 mg by mouth twice a day CHADSVASc at least 4 (age x 2, DM, female) Cardiology has recommended to start the patient on long-term full dose oral anticoagulation with a requests 5 mg twice a day or XARELTO 20 mg po daily once cleared  by ortho We will start the patient on Xarelto 20 mg once daily from today as orthopedics, Dr. Mack Guise has cleared this from ortho standpoint   # Femoral neck fracture, left, closed, initial encounter -   S/p left hip hemiarthroplasty pod #3 Pain rx per ortho recommendations. F/U with PT , recommending SNF   # Severe COPD -  No exacerbation continue home inhalers Dulera, Spiriva, and albuterol nebulizer as needed.   O2 via nasal cannula  # Diabetes mellitus, type 2 ([name]) -  on Januvia at home. resume Januvia  sliding scale insulin with  fingerstick glucose checks every 6 hours    carb modified diet.  hemoglobin A1c 6.9 Adding lantus 12 u once daily for basal coverage   # Anxiety - Continue home meds   #GERD (gastroesophageal reflux disease) - PPI while here   #Oral thrush-nystatin swish and swallow  All the records are reviewed and case discussed with ED provider. Management plans discussed with the patient and/or family.  DVT PROPHYLAXIS: started xarelto      All the records are reviewed and case discussed with Care Management/Social Workerr. Management plans discussed with the patient, family and they are in agreement.  CODE STATUS: full code  TOTAL CRITICAL CARE TIME TAKING CARE OF THIS PATIENT: 35 minutes.   POSSIBLE D/C IN 2-3DAYS, DEPENDING ON CLINICAL CONDITION.   Nicholes Mango M.D on 03/29/2015 at 3:12 PM  Between 7am to 6pm - Pager - [phone] After 6pm go to [email or web address] - password [email or web address] [facility]  Office  [phone]  CC: Primary care physician; Halina Maidens, MD

## 2015-03-29 NOTE — Progress Notes (Signed)
Inpatient Diabetes Program Recommendations  AACE/ADA: New Consensus Statement on Inpatient Glycemic Control (2015)  Target Ranges:  Prepandial:   less than 140 mg/dL      Peak postprandial:   less than 180 mg/dL (1-2 hours)      Critically ill patients:  140 - 180 mg/dL   Review of Glycemic Control:  Results for [name], [name] (MRN [id number]) as of 03/29/2015 10:53  Ref. Range 03/28/2015 08:57 03/28/2015 11:11 03/28/2015 16:28 03/28/2015 21:20 03/28/2015 22:23 03/29/2015 08:37  Glucose-Capillary Latest Ref Range: 65-99 mg/dL 225 (H) 266 (H) 243 (H) 239 (H) 242 (H) 192 (H)  Results for [name], [name] (MRN [id number]) as of 03/29/2015 10:53  Ref. Range 03/28/2015 03:53  Hemoglobin A1C Latest Ref Range: 4.0-6.0 % 6.9 (H)    Diabetes history: Type 2 diabetes Outpatient Diabetes medications: Januvia 50 mg daily Current orders for Inpatient glycemic control:  Novolog sensitive tid with meals and HS, Tradjenta 5 mg daily Inpatient Diabetes Program Recommendations:    Note that blood sugars are greater than goal however A1C indicates good control of diabetes prior to admission.  May consider adding basal insulin such as Lantus 12 units daily (0.2 units/kg) while in the hospital.  Will follow.   Thanks, Adah Perl, RN, BC-ADM Inpatient Diabetes Coordinator Pager [phone] (8a-5p)

## 2015-03-29 NOTE — Evaluation (Addendum)
Clinical/Bedside Swallow Evaluation Patient Details  Name: [name] MRN: [id number] Date of Birth: April 14, 1936  Today's Date: 03/29/2015 Time: SLP Start Time (ACUTE ONLY): 1500 SLP Stop Time (ACUTE ONLY): 1600 SLP Time Calculation (min) (ACUTE ONLY): 60 min  Past Medical History:  Past Medical History  Diagnosis Date  . DM (diabetes mellitus) ([place])   . Mitral valve prolapse   . COPD (chronic obstructive pulmonary disease) ([place])   . Palpitations   . Osteoarthritis   . Systemic lupus erythematosus ([place])   . Lumbar spondylolysis   . Raynaud's disease   . Anxiety   . IBS (irritable bowel syndrome)   . Uveitis, anterior     ????  . Rheumatic fever   . Ankylosing spondylitis ([place])     ? how diagnosed  . Insomnia, unspecified   . Chronic peptic ulcer, unspecified site, without mention of hemorrhage, perforation, or obstruction   . Paroxysmal tachycardia, unspecified ([place])   . History of kidney stones   . Chronic kidney infection   . Asthma     as child  . Pneumonia   . PONV (postoperative nausea and vomiting)   . Hypothyroidism     mass, U/S 6/21 - nodules  . Motion sickness     car - back seat  . GERD (gastroesophageal reflux disease)   . Wears dentures     full upper, partial lower  . Wears contact lenses   . Oxygen decrease     2l prn  . Raynaud disease   . Headache   . Neuropathy ([place])   . Wheezing    Past Surgical History:  Past Surgical History  Procedure Laterality Date  . Esophagogastroduodenoscopy  multiple  . Colonoscopy  multiple  . Total abdominal hysterectomy w/ bilateral salpingoophorectomy  1990  . Breast biopsy    . Biopsy thyroid    . Abdominal hysterectomy    . Esophagogastroduodenoscopy (egd) with propofol N/A 12/11/2014    Procedure: ESOPHAGOGASTRODUODENOSCOPY (EGD) WITH PROPOFOL;  Surgeon: [name], MD;  Location: [facility];  Service: Endoscopy;  Laterality: N/A;  cytology brushing  . Cataract extraction w/phaco Left  03/23/2015    Procedure: CATARACT EXTRACTION PHACO AND INTRAOCULAR LENS PLACEMENT (IOC);  Surgeon: Birder Robson, MD;  Location: [facility];  Service: Ophthalmology;  Laterality: Left;  [place] 00:51   . Orif hip fracture Left 03/2015  . Hip arthroplasty Left 03/26/2015    Procedure: ARTHROPLASTY BIPOLAR HIP (HEMIARTHROPLASTY);  Surgeon: Earnestine Leys, MD;  Location: [facility];  Service: Orthopedics;  Laterality: Left;   HPI:  Pt has a long standing h/o of Thrush from Feb. of 2016. Pt feels this has been affecting her swallowing and stated that foods "get stuck" pointing to her mid-chest/sternum area.    Assessment / Plan / Recommendation Clinical Impression  Pt appeared to adequately tolerate trials of thin liquids and soft solids w/ no overt s/s of aspiration; no decline in respiratory status and clear vocal quality b/t trials was noted. No decline in O2 sats noted. Oral phase wfl w/ all trials. Post swallowing trials, pt began beching and c/o dysmotility pointing to her lower sternum area. Pt was instructed to rest and encouraged single, small ice chips to aid perastalsis/esophageal motility. Pt's symptoms eased after a few minutes - of note, pt only had consumed 8-10 trials total b/f this reaction began. Pt appears at reduced risk for aspiration from an oropharyngeal standpoint but at increased risk for aspiration from an Esophageal phase/dysmotility standpoint. Rec. f/u w/  Pulmonolgy/GI for the Thrush (which pt/family believe is d/t the inhalers pt uses). NSG updated.     Aspiration Risk   (reduced)    Diet Recommendation Dysphagia 3 (Mech soft);Thin   Medication Administration: Whole meds with puree (cut small) Compensations: Slow rate;Small sips/bites;Minimize environmental distractions;Follow solids with liquid    Other  Recommendations Recommended Consults: Consider ENT evaluation;Consider GI evaluation (Pulmonology re: inhalers;  Dietician consult) Oral Care Recommendations: Oral care  BID;Patient independent with oral care   Follow Up Recommendations       Frequency and Duration min 2x/week  1 week   Pertinent Vitals/Pain Denied significant pain; Thrush ongoing and tx'd by [facility]  see care plan   Swallow Study Prior Functional Status   lived at home; has been "dealing w/ Thrush since Feb. 2016", per pt. Pt stated this has affected her oral intake overall; family agreed.    General Date of Onset: 03/25/15 Other Pertinent Information: Pt has a long standing h/o of Thrush from Feb. of 2016. Pt feels this has been affecting her swallowing and stated that foods "get stuck" pointing to her mid-chest/sternum area.  Type of Study: Bedside swallow evaluation    Oral/Motor/Sensory Function  appeared wfl   Ice Chips  3 trials; wfl  Thin Liquid  5 trials; wfl   Nectar Thick  NT  Honey Thick  NT  Puree  3 trials; wfl  Solid   GO     2 trials; wfl     Orinda Kenner, MS, CCC-SLP  Watson,Katherine 03/29/2015,4:54 PM

## 2015-03-29 NOTE — Consult Note (Signed)
Cardiology Consultation Note  Patient ID: [name], MRN: [id number], DOB/AGE: Apr 09, 1936 79 y.o. Admit date: 03/25/2015   Date of Consult: 03/29/2015 Primary Physician: Halina Maidens, MD Primary Cardiologist: Dr. Fletcher Anon, MD  Chief Complaint: Mechanical fall leading to closed left femoral neck fracture s/p hemiarthroplasty on 03/26/2015 Reason for Consult: Afib with RVR  HPI: 79 y.o. female with h/o paroxysmal tachy-palpitations, COPD with prn oxygen usage, DM2, SLE, history of rheumatic fever, MVP, and hypothyroidism who presented to [facility] on 10/14 after suffering a mechanical fall leading to a closed left femoral neck fracture s/p hemiarthroplasty on 03/26/2015, subsequently developing new onset Afib with RVR on 10/16.   She was last seen in clinic on 08/18/2013 for follow up of her palpitations. At that time she reported significant stress in July of 2014 as her sister had recently passed away. She had been experiencing tachy-palpitations with heart rates into the 170's bpm. She was also noted to have had a questionable syncopal episode at that time and was hospitalized in [place], [place]. She was placed on a BB, but did not take this as she did not feel like the prescriber knew her history well enough. Her palpitations continued. Echo in 05/2013 was unremarkable. Nuclear stress test in 05/2013 was also normal. Outpatient telemetry showed sinus rhythm with sinus tachycardia and occasional PVCs. No other significant arrhythmia.    She has intermittently had episodes of tachy-palpitations every couple of months or so. Episodes typically last several minutes to 4-5 hours before self resolving. Episodes are associated with SOB. She tries to be still during the episodes. Nothing she does brings them on that she has noted.   She suffered a mechanical fall on 10/14 while trying to get to her bathroom and tripped on her prn oxygen tubing. She suffered a closed left femoral neck fracture.   Upon  her arrival to [facility] she was found to have a closed left femoral neck fracture. She underwent successful hemiarthroplasty on 10/14. On the evening of post-op day #2 she developed tachy-palpitations and was found to be in Afib with RVR around 10 PM. She received IV bolus of Cardizem without success. Labs showed Mg++ 1.7, K+ 3.6-->3.9, HGB 9.0, WBC 7.5. No TSH. Follow up ECG showed sinus tachycardia with frequent PACs. She ultimately required IV Cardizem gtt followed by po metoprolol with conversion to sinus rhythm. She does report continued pain in the left hip. This afternoon she remains in sinus rhythm with heart rates in the 80's.     Past Medical History  Diagnosis Date  . DM (diabetes mellitus) ([place])   . Mitral valve prolapse   . COPD (chronic obstructive pulmonary disease) ([place])   . Palpitations   . Osteoarthritis   . Systemic lupus erythematosus ([place])   . Lumbar spondylolysis   . Raynaud's disease   . Anxiety   . IBS (irritable bowel syndrome)   . Uveitis, anterior     ????  . Rheumatic fever   . Ankylosing spondylitis ([place])     ? how diagnosed  . Insomnia, unspecified   . Chronic peptic ulcer, unspecified site, without mention of hemorrhage, perforation, or obstruction   . Paroxysmal tachycardia, unspecified ([place])   . History of kidney stones   . Chronic kidney infection   . Asthma     as child  . Pneumonia   . PONV (postoperative nausea and vomiting)   . Hypothyroidism     mass, U/S 6/21 - nodules  . Motion sickness  car - back seat  . GERD (gastroesophageal reflux disease)   . Wears dentures     full upper, partial lower  . Wears contact lenses   . Oxygen decrease     2l prn  . Raynaud disease   . Headache   . Neuropathy ([place])   . Wheezing       Most Recent Cardiac Studies: Echo 05/16/2013:  Study Conclusions  - Left ventricle: The cavity size was normal. Systolic function was normal. The estimated ejection fraction was in the range of 60% to 65%.  Wall motion was normal; there were no regional wall motion abnormalities. Doppler parameters are consistent with abnormal left ventricular relaxation (grade 1 diastolic dysfunction). - Left atrium: The atrium was normal in size. - Right ventricle: Systolic function was normal. - Pulmonary arteries: Systolic pressure was within the normal range. Impressions:  - Normal study.  Nuclear stress test 05/27/2013:  No significant ischemia, no artifact, no significant wall motion abnormality, EF 53%, no EKG changes concerning for ischemia, overall low risk scan.    Surgical History:  Past Surgical History  Procedure Laterality Date  . Esophagogastroduodenoscopy  multiple  . Colonoscopy  multiple  . Total abdominal hysterectomy w/ bilateral salpingoophorectomy  1990  . Breast biopsy    . Biopsy thyroid    . Abdominal hysterectomy    . Esophagogastroduodenoscopy (egd) with propofol N/A 12/11/2014    Procedure: ESOPHAGOGASTRODUODENOSCOPY (EGD) WITH PROPOFOL;  Surgeon: [name], MD;  Location: [facility];  Service: Endoscopy;  Laterality: N/A;  cytology brushing  . Cataract extraction w/phaco Left 03/23/2015    Procedure: CATARACT EXTRACTION PHACO AND INTRAOCULAR LENS PLACEMENT (IOC);  Surgeon: Birder Robson, MD;  Location: [facility];  Service: Ophthalmology;  Laterality: Left;  [place] 00:51   . Orif hip fracture Left 03/2015  . Hip arthroplasty Left 03/26/2015    Procedure: ARTHROPLASTY BIPOLAR HIP (HEMIARTHROPLASTY);  Surgeon: Earnestine Leys, MD;  Location: [facility];  Service: Orthopedics;  Laterality: Left;     Home Meds: Prior to Admission medications   Medication Sig Start Date End Date Taking? Authorizing Provider  moxifloxacin (VIGAMOX) 0.5 % ophthalmic solution 1 drop QID.   Yes Historical Provider, MD  tiotropium (SPIRIVA) 18 MCG inhalation capsule Place 18 mcg into inhaler and inhale daily.   Yes Historical Provider, MD  albuterol (PROVENTIL HFA;VENTOLIN HFA) 108  (90 BASE) MCG/ACT inhaler Inhale into the lungs 4 (four) times daily - after meals and at bedtime.     Historical Provider, MD  albuterol (PROVENTIL) (2.5 MG/3ML) 0.083% nebulizer solution Take 2.5 mg by nebulization every 6 (six) hours as needed for wheezing or shortness of breath.    Historical Provider, MD  AMBULATORY NON FORMULARY MEDICATION Oxygen (USES AS NEEDED)    Historical Provider, MD  LORazepam (ATIVAN) 1 MG tablet Take 0.5 tablets (0.5 mg total) by mouth 2 (two) times daily as needed. 02/22/15   Glean Hess, MD  Probiotic Product (ALIGN PO) Take by mouth.    Historical Provider, MD  ranitidine (ZANTAC) 150 MG tablet Take 150 mg by mouth 2 (two) times daily.    Historical Provider, MD  sitaGLIPtin (JANUVIA) 50 MG tablet Take 1 tablet (50 mg total) by mouth daily. 02/22/15   Glean Hess, MD  sucralfate (CARAFATE) 1 G tablet Take 1 g by mouth 4 (four) times daily.    Historical Provider, MD  Umeclidinium-Vilanterol [facility] ELLIPTA IN) Inhale into the lungs.    Historical Provider, MD    Inpatient  Medications:  . enoxaparin (LOVENOX) injection  30 mg Subcutaneous Q12H  . ferrous sulfate  325 mg Oral Q breakfast  . insulin aspart  0-5 Units Subcutaneous QHS  . insulin aspart  0-9 Units Subcutaneous TID WC  . linagliptin  5 mg Oral Daily  . metoprolol tartrate  12.5 mg Oral BID  . mometasone-formoterol  2 puff Inhalation BID  . moxifloxacin  1 drop Both Eyes QID  . senna  1 tablet Oral BID  . tiotropium  18 mcg Inhalation Daily   . sodium chloride 75 mL/hr at 03/29/15 0600  . diltiazem (CARDIZEM) infusion 5 mg/hr (03/29/15 0700)    Allergies:  Allergies  Allergen Reactions  . Codeine Nausea And Vomiting  . Penicillins Hives  . Sulfa Antibiotics   . Doxycycline Rash  . Erythromycin Rash  . Latex Rash    Social History   Social History  . Marital Status: Married    Spouse Name: N/A  . Number of Children: 4  . Years of Education: N/A   Occupational History    . retired    Social History Main Topics  . Smoking status: Former Smoker -- 0.25 packs/day for 40 years    Types: Cigarettes    Quit date: 10/11/2011  . Smokeless tobacco: Never Used     Comment: Quit 4 months ago   . Alcohol Use: No  . Drug Use: No  . Sexual Activity: Not on file   Other Topics Concern  . Not on file   Social History Narrative   Married (#2)   Moved from [place] in Summer 2014 - Lives in [place]   1 son, 3 daughters (one lives in this area)   2 caffeinated beverages daily   04/18/2013 update     Family History  Problem Relation Age of Onset  . Inflammatory bowel disease    . Ovarian cancer    . Diabetes    . Heart disease    . Irritable bowel syndrome    . Kidney disease    . Heart disease Mother   . Hypothyroidism Mother   . Diabetes Mother   . Heart attack Mother   . Hypertension Mother   . Heart disease Father   . Heart attack Father   . Breast cancer Sister   . Hypothyroidism Sister   . Diabetes Sister   . Ovarian cancer Sister   . Diabetes Brother   . Prostate cancer Brother   . Hypothyroidism Daughter      Review of Systems: Review of Systems  Constitutional: Positive for malaise/fatigue. Negative for fever, chills, weight loss and diaphoresis.  HENT: Negative for congestion.   Eyes: Negative for discharge and redness.  Respiratory: Positive for shortness of breath. Negative for cough, hemoptysis, sputum production and wheezing.   Cardiovascular: Positive for palpitations. Negative for chest pain, orthopnea, claudication, leg swelling and PND.  Gastrointestinal: Negative for heartburn, nausea, vomiting, abdominal pain, blood in stool and melena.  Genitourinary: Negative for hematuria.  Musculoskeletal: Positive for myalgias, joint pain and falls. Negative for back pain and neck pain.  Skin: Negative for rash.  Neurological: Positive for weakness. Negative for dizziness, tingling, tremors, sensory change, speech change, focal  weakness, seizures and loss of consciousness.  Endo/Heme/Allergies: Does not bruise/bleed easily.  Psychiatric/Behavioral: Negative for substance abuse. The patient is nervous/anxious.   All other systems reviewed and are negative.    Labs: No results for input(s): CKTOTAL, CKMB, TROPONINI in the last 72 hours. Lab  Results  Component Value Date   WBC 7.5 03/29/2015   HGB 9.0* 03/29/2015   HCT 27.0* 03/29/2015   MCV 92.8 03/29/2015   PLT 122* 03/29/2015    Recent Labs Lab 03/25/15 2316  03/28/15 0353  NA 142  < > 141  K 4.0  < > 3.6  CL 103  < > 109  CO2 33*  < > 28  BUN 16  < > 11  CREATININE 0.78  < > 0.60  CALCIUM 9.3  < > 8.4*  PROT 6.8  --   --   BILITOT 0.4  --   --   ALKPHOS 67  --   --   ALT 17  --   --   AST 20  --   --   GLUCOSE 151*  < > 186*  < > = values in this interval not displayed. No results found for: CHOL, HDL, LDLCALC, TRIG No results found for: DDIMER  Radiology/Studies:  Dg Chest 1 View  03/25/2015  CLINICAL DATA:  Left hip injury after a fall. History of COPD on oxygen. EXAM: CHEST 1 VIEW COMPARISON:  10/21/2013 FINDINGS: Diffuse emphysematous changes in the chest. Normal heart size and pulmonary vascularity. No focal airspace disease or consolidation in the lungs. No blunting of costophrenic angles. No pneumothorax. Mediastinal contours appear intact. Calcification of the aorta. IMPRESSION: Emphysematous changes in the lungs. No evidence of active pulmonary disease. Electronically Signed   By: Lucienne Capers M.D.   On: 03/25/2015 23:55   Dg Hip Unilat With Pelvis 1v Left  03/26/2015  CLINICAL DATA:  Postoperative left hip hemiarthroplasty. EXAM: DG HIP (WITH OR WITHOUT PELVIS) 1V*L* COMPARISON:  None. FINDINGS: Left hip hemi arthroplasty identified without subluxation or dislocation. No acute bony abnormalities are present. No hardware complicating features are noted. Soft tissue postoperative changes are present. IMPRESSION: Left hip  hemiarthroplasty without complicating features. Electronically Signed   By: Margarette Canada M.D.   On: 03/26/2015 18:29   Dg Hip Unilat With Pelvis 2-3 Views Left  03/25/2015  CLINICAL DATA:  79 year old female tripped and fell going to the bathroom with left hip pain. Initial encounter. EXAM: DG HIP (WITH OR WITHOUT PELVIS) 2-3V LEFT COMPARISON:  CT Abdomen and Pelvis 02/04/2014 FINDINGS: Comminuted left femoral neck fracture with varus impaction. The intertrochanteric segment appears to remain intact. The left femoral head remains articulating with the acetabulum. Small mildly displaced comminution fragments including that projecting adjacent to the lesser trochanter. Pelvis Intact. Proximal right femur appears intact. Stable small focus of dystrophic calcification in the medial proximal right thigh soft tissues. IMPRESSION: Comminuted left femoral neck fracture with varus impaction. Electronically Signed   By: Genevie Ann M.D.   On: 03/25/2015 23:50    EKG: sinus tachycardia with short PR, 116 bpm, poor R wave progression, nonspecific st/t changes  Telemetry: sinus rhythm to sinus tachycardia with frequent PACs, PAF overnight   Weights: Filed Weights   03/25/15 2244 03/26/15 0317  Weight: 127 lb (57.607 kg) 130 lb 14.4 oz (59.376 kg)     Physical Exam: Blood pressure 119/56, pulse 96, temperature 99.3 F (37.4 C), temperature source Oral, resp. rate 23, height 5\' 4"  (1.626 m), weight 130 lb 14.4 oz (59.376 kg), SpO2 99 %. Body mass index is 22.46 kg/(m^2). General: Well developed, well nourished, in no acute distress. Head: Normocephalic, atraumatic, sclera non-icteric, no xanthomas, nares are without discharge.  Neck: Negative for carotid bruits. JVD not elevated. Lungs: Clear bilaterally to auscultation without wheezes,  rales, or rhonchi. Breathing is unlabored. Heart: RRR with S1 S2. No murmurs, rubs, or gallops appreciated. Abdomen: Soft, non-tender, non-distended with normoactive bowel  sounds. No hepatomegaly. No rebound/guarding. No obvious abdominal masses. Msk:  Strength and tone appear normal for age. Extremities: No clubbing or cyanosis. No edema.  Distal pedal pulses are 2+ and equal bilaterally. Neuro: Alert and oriented X 3. No facial asymmetry. No focal deficit. Moves all extremities spontaneously. Psych:  Responds to questions appropriately with a normal affect.    Assessment and Plan:  79 y.o. female with h/o paroxysmal tachy-palpitations, COPD with prn oxygen usage, DM2, SLE, history of rheumatic fever, MVP, and hypothyroidism who presented to [facility] on 10/14 after suffering a mechanical fall leading to a closed left femoral neck fracture s/p hemiarthroplasty on 03/26/2015, subsequently developing new onset Afib with RVR on 10/16.  1. New onset Afib with RVR: -Currently in sinus rhythm -Likely exacerbated by femoral neck fracture -Continue Lopressor 12.5 mg bid -Check echo -CHADSVASc at least 4 (age x 2, DM, female) -When ok per ortho would recommend long term full dose anticoagulation given her CHADSVASc as above  2. Femoral neck fracture: -Status post hemiarthroplasty -Per ortho  3. COPD: -No exacerbation -PRN oxygen  4. DM2: -Per IM   Signed, Christell Faith, PA-C Pager: [phone] 03/29/2015, 11:32 AM

## 2015-03-29 NOTE — Progress Notes (Signed)
Physical Therapy Treatment Patient Details Name: Chayah Mckee MRN: [id number] DOB: 11/29/1935 Today's Date: 03/29/2015    History of Present Illness Pt is a 79 yo femle who sustained a femoral neck fracture and now POD #2 L hemi arthroplasty.      PT Comments    Pt notes mild pain in L hip; not requiring medication currently. Pt notes fatigue and does not wish up at this time. Heart rate on monitor also ranging between 106 and 125 beats per minute, so session limited to bed exercises this a.m. Plan to see pt this p.m and possibly attempt up in bed/out of bed at that time.   Follow Up Recommendations  SNF     Equipment Recommendations  Rolling walker with 5" wheels    Recommendations for Other Services       Precautions / Restrictions Precautions Precautions: Posterior Hip;Fall Restrictions Weight Bearing Restrictions: Yes LLE Weight Bearing: Partial weight bearing    Mobility  Bed Mobility               General bed mobility comments: Not tested; pt did not feel up to getting up this am; maybe pm  Transfers                    Ambulation/Gait                 Stairs            Wheelchair Mobility    Modified Rankin (Stroke Patients Only)       Balance                                    Cognition Arousal/Alertness: Awake/alert Behavior During Therapy: WFL for tasks assessed/performed Overall Cognitive Status: Within Functional Limits for tasks assessed                      Exercises Total Joint Exercises Ankle Circles/Pumps: AROM;Both;Other reps (comment);Supine (30x) Quad Sets: Strengthening;Both;20 reps;Supine Gluteal Sets: Strengthening;Both;20 reps;Supine Towel Squeeze: Strengthening;Both;20 reps;Supine Short Arc Quad: Both;AROM;20 reps;Supine Heel Slides: AAROM;Left;20 reps;Supine (AROM R) Hip ABduction/ADduction: AAROM;Left;20 reps;Supine (AROM R) Straight Leg Raises: AAROM;20  reps;Supine;Both    General Comments        Pertinent Vitals/Pain Pain Assessment: 0-10 Pain Score: 3  Pain Intervention(s): Limited activity within patient's tolerance    Home Living                      Prior Function            PT Goals (current goals can now be found in the care plan section) Progress towards PT goals: Progressing toward goals (slowly)    Frequency  BID    PT Plan Current plan remains appropriate    Co-evaluation             End of Session Equipment Utilized During Treatment: Oxygen Activity Tolerance: Patient limited by fatigue Patient left: in bed;with call bell/phone within reach;with bed alarm set     Time: 0934-1000 PT Time Calculation (min) (ACUTE ONLY): 26 min  Charges:  $Therapeutic Exercise: 23-37 mins                    G Codes:      Charlaine Dalton 03/29/2015, 12:04 PM

## 2015-03-29 NOTE — Progress Notes (Signed)
Pt converted to SR with occasional PAC, Cardizem gtt titrated off and on paused for now

## 2015-03-29 NOTE — Progress Notes (Signed)
Physical Therapy Treatment Patient Details Name: [name] MRN: [id number] DOB: 05/25/1936 Today's Date: 03/29/2015    History of Present Illness Pt is a 79 yo femle who sustained a femoral neck fracture and now POD #2 L hemi arthroplasty.      PT Comments    Pt agreeable to up in bed this pm. Continues to complain of general fatigue/malaise. Cardiologist in during session and notes patient will be placed on medication to control chances of patient going into A-fib; heart rate to be monitored closely with activity. Activity held to exercises and up to edge of bed until patient has started medical control of heart rate. Pt required use of inhaler during seated activity and fatigued post session; returned to supine position with heavy Mod A. Encouraged continue work on bed level strengthening; pt understands. Continue PT progressing exercise/activity as appropriate and tolerated to improve overall strength, endurance and functional mobility.   Follow Up Recommendations  SNF     Equipment Recommendations  Rolling walker with 5" wheels    Recommendations for Other Services       Precautions / Restrictions Precautions Precautions: Posterior Hip;Fall Restrictions Weight Bearing Restrictions: Yes LLE Weight Bearing: Partial weight bearing    Mobility  Bed Mobility Overal bed mobility: Needs Assistance Bed Mobility: Supine to Sit;Sit to Supine     Supine to sit: Mod assist Sit to supine: Mod assist   General bed mobility comments: Mod A for lower extremities and trunk  Transfers                 General transfer comment: Not tested at this time; see note  Ambulation/Gait                 Stairs            Wheelchair Mobility    Modified Rankin (Stroke Patients Only)       Balance Overall balance assessment: Needs assistance Sitting-balance support: Bilateral upper extremity supported Sitting balance-Leahy Scale: Fair Sitting balance -  Comments: Tends to lean over on R elbow; cues for upright posture                            Cognition Arousal/Alertness: Awake/alert Behavior During Therapy: WFL for tasks assessed/performed Overall Cognitive Status: Within Functional Limits for tasks assessed                      Exercises Total Joint Exercises Ankle Circles/Pumps: AROM;Both;Other reps (comment);Supine (30x) Quad Sets: Strengthening;Both;20 reps;Supine Gluteal Sets: Strengthening;Both;20 reps;Supine Towel Squeeze: Strengthening;Both;20 reps;Supine Short Arc Quad: Both;AROM;20 reps;Supine Heel Slides: AAROM;Left;20 reps;Supine (AROM R) Hip ABduction/ADduction: AAROM;Left;20 reps;Supine (AROM R) Straight Leg Raises: AAROM;20 reps;Supine;Both Long Arc Quad: AAROM;Left;20 reps;Seated (AROM R)    General Comments        Pertinent Vitals/Pain Pain Assessment: 0-10 Pain Score: 4  Pain Intervention(s): Limited activity within patient's tolerance    Home Living                      Prior Function            PT Goals (current goals can now be found in the care plan section) Progress towards PT goals: Progressing toward goals    Frequency  BID    PT Plan Current plan remains appropriate    Co-evaluation             End of Session Equipment  Utilized During Treatment: Oxygen Activity Tolerance: Patient limited by fatigue Patient left: in bed;with call bell/phone within reach;with bed alarm set     Time: 1345-1415 PT Time Calculation (min) (ACUTE ONLY): 30 min  Charges:  $Therapeutic Exercise: 8-22 mins $Therapeutic Activity: 8-22 mins                    G Codes:      [name] 03/29/2015, 2:24 PM

## 2015-03-29 NOTE — Care Management Note (Addendum)
Case Management Note  Patient Details  Name: Yumalay Circle MRN: [id number] Date of Birth: Aug 10, 1935  Subjective/Objective:   POD #3 s/p left hip hemiarthroplasty for fracture. Transferred to the CCU following acute Afib with RVR. Placed on Cardizem gtt.  CSW following for possible SNF placement.                 Action/Plan: SNF  Expected Discharge Date:                  Expected Discharge Plan:     In-House Referral:  Clinical Social Work  Discharge planning Services  CM Consult  Post Acute Care Choice:  Home Health Choice offered to:  Patient, Spouse  DME Arranged:    DME Agency:     HH Arranged:    HH Agency:     Status of Service:  In process, will continue to follow  Medicare Important Message Given:    Date Medicare IM Given:    Medicare IM give by:    Date Additional Medicare IM Given:    Additional Medicare Important Message give by:     If discussed at [place] of Stay Meetings, dates discussed:    Additional Comments:  Jolly Mango, RN 03/29/2015, 9:42 AM

## 2015-03-29 NOTE — Progress Notes (Signed)
eLink Physician-Brief Progress Note Patient Name: Trichelle Lehan DOB: Jun 17, 1935 MRN: [id number]   Date of Service  03/29/2015  HPI/Events of Note  Patient converted to NSR from Atrial fibrillation w/ RVR. Now off drip.  eICU Interventions  Checking EKG to evaluate for any signs of ischemia.     Intervention Category Major Interventions: Arrhythmia - evaluation and management  Tera Partridge 03/29/2015, 2:52 AM

## 2015-03-29 NOTE — Progress Notes (Signed)
*  PRELIMINARY RESULTS* Echocardiogram 2D Echocardiogram has been performed.  [name] 03/29/2015, 5:00 PM

## 2015-03-29 NOTE — Progress Notes (Signed)
Subjective:  Patient transferred to the ICU overnight for atrial fibrillation with RVR. Patient states that she has had several episodes of atrial fibrillation in her lifetime. Patient is sitting up in bed and states she feels well currently. Family members including her husband are at the bedside. Her nurse is also in the room.  Patient reports pain as mild.    Objective:   VITALS:   Filed Vitals:   03/29/15 1000 03/29/15 1100 03/29/15 1200 03/29/15 1345  BP:   113/57   Pulse: 81 91 85 99  Temp:      TempSrc:      Resp: 22 18 21    Height:      Weight:      SpO2: 100% 100% 100% 96%    PHYSICAL EXAM:  Patient has mild to moderate segments drainage under the left hip dressing. Her thigh compartments are soft and compressible. There is no erythema and mild ecchymosis and swelling. Patient has intact sensation throughout the left lower extremity. She has palpable pedal pulses. She can dorsiflex and plantarflex her ankle and flex and extend her toes. There is no calf tenderness. She is wearing TED stockings.   LABS  Results for orders placed or performed during the hospital encounter of 03/25/15 (from the past 24 hour(s))  Glucose, capillary     Status: Abnormal   Collection Time: 03/28/15  4:28 PM  Result Value Ref Range   Glucose-Capillary 243 (H) 65 - 99 mg/dL   Comment 1 Notify RN   Glucose, capillary     Status: Abnormal   Collection Time: 03/28/15  9:20 PM  Result Value Ref Range   Glucose-Capillary 239 (H) 65 - 99 mg/dL   Comment 1 Notify RN   Glucose, capillary     Status: Abnormal   Collection Time: 03/28/15 10:23 PM  Result Value Ref Range   Glucose-Capillary 242 (H) 65 - 99 mg/dL  MRSA PCR Screening     Status: None   Collection Time: 03/28/15 10:25 PM  Result Value Ref Range   MRSA by PCR NEGATIVE NEGATIVE  CBC     Status: Abnormal   Collection Time: 03/29/15  4:04 AM  Result Value Ref Range   WBC 7.5 3.6 - 11.0 K/uL   RBC 2.91 (L) 3.80 - 5.20 MIL/uL   Hemoglobin 9.0 (L) 12.0 - 16.0 g/dL   HCT 27.0 (L) 35.0 - 47.0 %   MCV 92.8 80.0 - 100.0 fL   MCH 31.0 26.0 - 34.0 pg   MCHC 33.4 32.0 - 36.0 g/dL   RDW 12.7 11.5 - 14.5 %   Platelets 122 (L) 150 - 440 K/uL  Glucose, capillary     Status: Abnormal   Collection Time: 03/29/15  8:37 AM  Result Value Ref Range   Glucose-Capillary 192 (H) 65 - 99 mg/dL  Basic metabolic panel     Status: Abnormal   Collection Time: 03/29/15  9:29 AM  Result Value Ref Range   Sodium 137 135 - 145 mmol/L   Potassium 3.9 3.5 - 5.1 mmol/L   Chloride 103 101 - 111 mmol/L   CO2 29 22 - 32 mmol/L   Glucose, Bld 228 (H) 65 - 99 mg/dL   BUN 10 6 - 20 mg/dL   Creatinine, Ser 0.62 0.44 - 1.00 mg/dL   Calcium 8.5 (L) 8.9 - 10.3 mg/dL   GFR calc non Af Amer >60 >60 mL/min   GFR calc Af Amer >60 >60 mL/min   Anion gap 5  5 - 15  Magnesium     Status: None   Collection Time: 03/29/15  9:29 AM  Result Value Ref Range   Magnesium 1.7 1.7 - 2.4 mg/dL  Glucose, capillary     Status: Abnormal   Collection Time: 03/29/15 11:49 AM  Result Value Ref Range   Glucose-Capillary 196 (H) 65 - 99 mg/dL    No results found.  Assessment/Plan: 3 Days Post-Op   Principal Problem:   Femoral neck fracture, left, closed, initial encounter Active Problems:   COPD, severe (HCC)   Diabetes mellitus, type 2 ([place])   GERD (gastroesophageal reflux disease)   Anxiety  Patient is stable despite episode of atrial fibrillation. She is currently in sinus rhythm. Patient will remain in the ICU for monitoring overnight.  Dr. Margaretmary Eddy contact me to explain the cardiologists have recommended Eliquis for anticoagulation. I have discussed this also with Dr. Sabra Heck who is in agreement with the plan for Eliquis.   Resume physical therapy when appropriate medically. Continue incentive spirometry while awake.    Thornton Park , MD 03/29/2015, 4:24 PM

## 2015-03-29 NOTE — Clinical Social Work Note (Signed)
CSW has extended bed offers to patient this afternoon. She has requested if I could return in a few minutes and speak with her daughter as well.  Shela Leff MSW,LCSW [phone]

## 2015-03-29 NOTE — Progress Notes (Signed)
Notified Dr. Margaretmary Eddy about patient diabetes referral and suggestion lantus 12 units daily and that patient complaining for thrush in the mouth.  Patient had converted to NSR around 2am then night shift had placed patient back on cardizem drip at 56mcqs- early this am due to ST with PVC/PAC- pt now sinus arrthymia and PAC's.

## 2015-03-30 ENCOUNTER — Ambulatory Visit: Admission: RE | Admit: 2015-03-30 | Payer: Medicare Other | Source: Ambulatory Visit

## 2015-03-30 DIAGNOSIS — J449 Chronic obstructive pulmonary disease, unspecified: Secondary | ICD-10-CM

## 2015-03-30 DIAGNOSIS — W19XXXA Unspecified fall, initial encounter: Secondary | ICD-10-CM

## 2015-03-30 DIAGNOSIS — I4891 Unspecified atrial fibrillation: Secondary | ICD-10-CM

## 2015-03-30 DIAGNOSIS — Z96649 Presence of unspecified artificial hip joint: Secondary | ICD-10-CM

## 2015-03-30 DIAGNOSIS — E119 Type 2 diabetes mellitus without complications: Secondary | ICD-10-CM

## 2015-03-30 DIAGNOSIS — Z9660 Presence of unspecified orthopedic joint implant: Secondary | ICD-10-CM

## 2015-03-30 LAB — BASIC METABOLIC PANEL
Anion gap: 4 — ABNORMAL LOW (ref 5–15)
BUN: 11 mg/dL (ref 6–20)
CO2: 30 mmol/L (ref 22–32)
CREATININE: 0.64 mg/dL (ref 0.44–1.00)
Calcium: 8.3 mg/dL — ABNORMAL LOW (ref 8.9–10.3)
Chloride: 106 mmol/L (ref 101–111)
GFR calc Af Amer: >60 mL/min (ref >60)
Glucose, Bld: 235 mg/dL — ABNORMAL HIGH (ref 65–99)
Potassium: 4 mmol/L (ref 3.5–5.1)
SODIUM: 140 mmol/L (ref 135–145)

## 2015-03-30 LAB — COMPREHENSIVE METABOLIC PANEL
ALT: 19 U/L (ref 14–54)
AST: 26 U/L (ref 15–41)
Albumin: 2.4 g/dL — ABNORMAL LOW (ref 3.5–5.0)
Alkaline Phosphatase: 56 U/L (ref 38–126)
Anion gap: 4 — ABNORMAL LOW (ref 5–15)
BILIRUBIN TOTAL: 0.3 mg/dL (ref 0.3–1.2)
BUN: 10 mg/dL (ref 6–20)
CHLORIDE: 107 mmol/L (ref 101–111)
CO2: 30 mmol/L (ref 22–32)
CREATININE: 0.71 mg/dL (ref 0.44–1.00)
Calcium: 8.4 mg/dL — ABNORMAL LOW (ref 8.9–10.3)
Glucose, Bld: 234 mg/dL — ABNORMAL HIGH (ref 65–99)
Potassium: 4 mmol/L (ref 3.5–5.1)
Sodium: 141 mmol/L (ref 135–145)
TOTAL PROTEIN: 5.3 g/dL — AB (ref 6.5–8.1)

## 2015-03-30 LAB — GLUCOSE, CAPILLARY
GLUCOSE-CAPILLARY: 214 mg/dL — AB (ref 65–99)
GLUCOSE-CAPILLARY: 254 mg/dL — AB (ref 65–99)
Glucose-Capillary: 221 mg/dL — ABNORMAL HIGH (ref 65–99)
Glucose-Capillary: 198 mg/dL — ABNORMAL HIGH (ref 65–99)

## 2015-03-30 LAB — CBC
HCT: 26.4 % — ABNORMAL LOW (ref 35.0–47.0)
Hemoglobin: 8.8 g/dL — ABNORMAL LOW (ref 12.0–16.0)
MCH: 31.2 pg (ref 26.0–34.0)
MCHC: 33.3 g/dL (ref 32.0–36.0)
MCV: 93.7 fL (ref 80.0–100.0)
PLATELETS: 160 10*3/uL (ref 150–440)
RBC: 2.82 MIL/uL — ABNORMAL LOW (ref 3.80–5.20)
RDW: 12.9 % (ref 11.5–14.5)
WBC: 6.9 10*3/uL (ref 3.6–11.0)

## 2015-03-30 LAB — MAGNESIUM: Magnesium: 1.7 mg/dL (ref 1.7–2.4)

## 2015-03-30 LAB — TSH: TSH: 1.327 u[IU]/mL (ref 0.350–4.500)

## 2015-03-30 MED ORDER — HALOPERIDOL LACTATE 5 MG/ML IJ SOLN: 5.0000 mg | Freq: Four times a day (QID) | INTRAMUSCULAR | Status: DC | PRN

## 2015-03-30 MED ORDER — DILTIAZEM HCL 25 MG/5ML IV SOLN
20.0000 mg | Freq: Once | INTRAVENOUS | Status: AC
  Administered 2015-03-30: 20 mg via INTRAVENOUS
  Filled 2015-03-30: qty 5

## 2015-03-30 MED ORDER — CEPASTAT 14.5 MG MT LOZG
1.0000 | LOZENGE | Freq: Four times a day (QID) | OROMUCOSAL | Status: DC
  Administered 2015-03-30 – 2015-04-01 (×8): 1 via BUCCAL
  Filled 2015-03-30 (×2): qty 9

## 2015-03-30 MED ORDER — DILTIAZEM HCL 25 MG/5ML IV SOLN
20.0000 mg | Freq: Once | INTRAVENOUS | Status: AC
  Administered 2015-03-30: 20 mg via INTRAVENOUS
  Filled 2015-03-30 (×2): qty 5

## 2015-03-30 MED ORDER — AMIODARONE HCL 200 MG PO TABS
400.0000 mg | ORAL_TABLET | Freq: Two times a day (BID) | ORAL | Status: DC
  Administered 2015-03-30: 400 mg via ORAL
  Filled 2015-03-30: qty 2

## 2015-03-30 MED ORDER — HALOPERIDOL LACTATE 5 MG/ML IJ SOLN
5.0000 mg | Freq: Once | INTRAMUSCULAR | Status: AC
  Administered 2015-03-30: 5 mg via INTRAVENOUS
  Filled 2015-03-30: qty 1

## 2015-03-30 MED ORDER — GUAIFENESIN-DM 100-10 MG/5ML PO SYRP: 5.0000 mL | ORAL_SOLUTION | ORAL | Status: DC | PRN

## 2015-03-30 MED ORDER — AMIODARONE HCL 200 MG PO TABS
400.0000 mg | ORAL_TABLET | Freq: Three times a day (TID) | ORAL | Status: DC
  Administered 2015-03-30 (×2): 400 mg via ORAL
  Filled 2015-03-30 (×2): qty 2

## 2015-03-30 NOTE — Care Management (Signed)
Patient is known to the [facility] network.  Kim with [facility] aware of admission and updated on plan of care

## 2015-03-30 NOTE — Progress Notes (Signed)
OT Cancellation Note  Patient Details Name: [name] MRN: [id number] DOB: 01/16/1936   Cancelled Treatment:     Unable to see patient for OT evaluation due to needing new orders secondary to change in medical status.  Contacted Peggy at Dr Delman Cheadle office to obtain new OT eval and treat order.    Pt had an episode of A-Fib and agitation last night and required Haldol.  Chart reviewed and will proceed with OT evaluation once order is received.  Wofford,Susan 03/30/2015, 9:58 AM    Chrys Racer, OTR/L ascom [phone]

## 2015-03-30 NOTE — Clinical Social Work Note (Signed)
CSW followed up with patient and her brother: [name], this afternoon and they have chosen [facility]. CSW has informed [facility] of acceptance of the bed offer via carefinder. CSW has also notified Alinda Money [phone]), patient's medicare bundle case manager of the update. Shela Leff MSW,LCSW [phone]

## 2015-03-30 NOTE — Progress Notes (Signed)
PT Cancellation Note  Patient Details Name: [name] MRN: [id number] DOB: 1935/10/01   Cancelled Treatment:    Reason Eval/Treat Not Completed: Other (comment). Treatment attempted this a.m; pt was not having "a good morning". Pt required personal hygiene and bed change. Agreed to re attempt treatment this p.m.    [place] 03/30/2015, 1:35 PM

## 2015-03-30 NOTE — Care Management (Addendum)
Received patient from 1A for atrial fib with RVR.  She is still requiring IV push cardizem.  If patient continues to require iv doses will be transferred to icu and be placed on cardizem drip

## 2015-03-30 NOTE — Clinical Social Work Note (Signed)
Patient has been transferred to 2A at this time. CSW met with patient's brother, Marden Noble, this morning and he stated he had the list of facilities that was provided to patient yesterday and that he was going to review them but left them at home. CSW printed off another copy of the bed offers and he is going to review.  Shela Leff MSW,LCSW

## 2015-03-30 NOTE — Care Management Important Message (Signed)
Important Message  Patient Details  Name: [name] MRN: [id number] Date of Birth: Dec 14, 1935   Medicare Important Message Given:  Yes-second notification given    Juliann Pulse A Allmond 03/30/2015, 10:37 AM

## 2015-03-30 NOTE — Progress Notes (Signed)
[facility] [place] NAME: [name]    MR#:  [id number]  DATE OF BIRTH:  Oct 25, 1935  SUBJECTIVE:  CHIEF COMPLAINT:  Pt is resting comfortably. Went into afib with RVR last night again . Denies any palpitations today am Recently had cataract surgery  REVIEW OF SYSTEMS:  CONSTITUTIONAL: No fever, fatigue or weakness.  EYES: No blurred or double vision.  EARS, NOSE, AND THROAT: No tinnitus or ear pain.  RESPIRATORY: No cough, shortness of breath, wheezing or hemoptysis.  CARDIOVASCULAR: Palpitations are improved. No chest pain, orthopnea, edema.  GASTROINTESTINAL: No nausea, vomiting, diarrhea or abdominal pain.  GENITOURINARY: No dysuria, hematuria.  ENDOCRINE: No polyuria, nocturia,  HEMATOLOGY: No anemia, easy bruising or bleeding SKIN: No rash or lesion. MUSCULOSKELETAL: left hip pain .   NEUROLOGIC: No tingling, numbness, weakness.  PSYCHIATRY: No anxiety or depression.   DRUG ALLERGIES:   Allergies  Allergen Reactions  . Codeine Nausea And Vomiting  . Penicillins Hives  . Sulfa Antibiotics   . Doxycycline Rash  . Erythromycin Rash  . Latex Rash    VITALS:  Blood pressure 103/51, pulse 66, temperature 99.3 F (37.4 C), temperature source Oral, resp. rate 18, height 5\' 4"  (1.626 m), weight 64.864 kg (143 lb), SpO2 93 %.  PHYSICAL EXAMINATION:  GENERAL:  79 y.o.-year-old patient lying in the bed with no acute distress.  EYES: Pupils equal, round, reactive to light and accommodation. No scleral icterus. Extraocular muscles intact.  HEENT: Head atraumatic, normocephalic. Oropharynx and nasopharynx clear.  NECK:  Supple, no jugular venous distention. No thyroid enlargement, no tenderness.  LUNGS: Normal breath sounds bilaterally, no wheezing, rales,rhonchi or crepitation. No use of accessory muscles of respiration.  CARDIOVASCULAR: Irregularly irregular. No murmurs, rubs, or gallops.  ABDOMEN: Soft, nontender,  nondistended. Bowel sounds present. No organomegaly or mass.  EXTREMITIES: Left lower extremity in a cast .No pedal edema, cyanosis, or clubbing.  NEUROLOGIC: Cranial nerves II through XII are intact. Muscle strength 5/5 in all extremities except lle, with clean dressing.  Sensation intact. Gait not checked.  PSYCHIATRIC: The patient is alert and oriented x 3.  SKIN: No obvious rash, lesion, or ulcer.    LABORATORY PANEL:   CBC  Recent Labs Lab 03/30/15 0544  WBC 6.9  HGB 8.8*  HCT 26.4*  PLT 160   ------------------------------------------------------------------------------------------------------------------  Chemistries   Recent Labs Lab 03/30/15 0544  NA 141  140  K 4.0  4.0  CL 107  106  CO2 30  30  GLUCOSE 234*  235*  BUN 10  11  CREATININE 0.71  0.64  CALCIUM 8.4*  8.3*  MG 1.7  AST 26  ALT 19  ALKPHOS 56  BILITOT 0.3   ------------------------------------------------------------------------------------------------------------------  Cardiac Enzymes No results for input(s): TROPONINI in the last 168 hours. ------------------------------------------------------------------------------------------------------------------  RADIOLOGY:  No results found.  EKG:   Orders placed or performed during the hospital encounter of 03/25/15  . EKG 12-Lead  . EKG 12-Lead  . EKG 12-Lead  . EKG 12-Lead  . EKG 12-Lead  . EKG 12-Lead  . EKG 12-Lead  . EKG 12-Lead    ASSESSMENT AND PLAN:   #New-onset A. fib with RVR D/ced Cardizem drip Continue metoprolol 25 mg by mouth twice a day Patient is started on amiodarone 400 mg by mouth twice a day, which need to be continued for a week and then 200 mg twice a day for a week followed by 200 mg once daily  CHADSVASc at least 4 (age x 2, DM, female) Cardiology Dr. Fletcher Anon has recommended to start the patient on long-term full dose oral anticoagulation with a requests 5 mg twice a day or XARELTO 20 mg po daily ,  patient was started on Xarelto yesterday after cleared from the ortho standpoint    # Femoral neck fracture, left, closed, initial encounter -   S/p left hip hemiarthroplasty pod #4 Pain rx per ortho recommendations. F/U with PT , recommending SNF   # Severe COPD -  No exacerbation continue home inhalers Dulera, Spiriva, and albuterol nebulizer as needed.   O2 via nasal cannula  # Diabetes mellitus, type 2 ([place]) -  on Januvia at home. resume Januvia  sliding scale insulin with  fingerstick glucose checks every 6 hours    carb modified diet.  hemoglobin A1c 6.9 Adding lantus 12 u once daily for basal coverage   # Anxiety - Continue home meds   #GERD (gastroesophageal reflux disease) - PPI while here   #Oral thrush-nystatin swish and swallow  All the records are reviewed and case discussed with ED provider. Management plans discussed with the patient and/or family.  DVT PROPHYLAXIS: started xarelto  Disposition skilled nursing facility once clinically stable    All the records are reviewed and case discussed with Care Management/Social Workerr. Management plans discussed with the patient, family and they are in agreement.  CODE STATUS: full code  TOTAL CRITICAL CARE TIME TAKING CARE OF THIS PATIENT: 35 minutes.   POSSIBLE D/C IN 2-3DAYS, DEPENDING ON CLINICAL CONDITION.   Nicholes Mango M.D on 03/30/2015 at 2:44 PM  Between 7am to 6pm - Pager - [phone] After 6pm go to [email or web address] - password [email or web address] [facility]  Office  [phone]  CC: Primary care physician; Halina Maidens, MD

## 2015-03-30 NOTE — Progress Notes (Signed)
Patient: [name] / Admit Date: 03/25/2015 / Date of Encounter: 03/30/2015, 7:39 AM   Subjective: Patient very sleepy this morning, last receiving Haldol at 2:58 AM. She went into Afib with RVR with heart rates in the 140's to 170's overnight beginning around 1:02 AM requiring IV Cardizem and Haldol 2/2 agitation. She is currently in sinus rhythm with heart rates in the 90's.    Review of Systems: Review of Systems  Unable to perform ROS: other   Sleepy  Objective: Telemetry: Currently sinus rhythm in the 90's. Went into Afib with RVR AT 1:02 AM with HR ranging from the 140's to 170's.  Physical Exam: Blood pressure 126/68, pulse 145, temperature 98 F (36.7 C), temperature source Oral, resp. rate 18, height 5\' 4"  (1.626 m), weight 130 lb 14.4 oz (59.376 kg), SpO2 100 %. Body mass index is 22.46 kg/(m^2). General: Well developed, well nourished, in no acute distress. Head: Normocephalic, atraumatic, sclera non-icteric, no xanthomas, nares are without discharge. Neck: Negative for carotid bruits. JVP not elevated. Lungs: Clear bilaterally to auscultation without wheezes, rales, or rhonchi. Breathing is unlabored. Heart: RRR S1 S2 without murmurs, rubs, or gallops.  Abdomen: Soft, non-tender, non-distended with normoactive bowel sounds. No rebound/guarding. Extremities: No clubbing or cyanosis. No edema. Distal pedal pulses are 2+ and equal bilaterally. Neuro: Alert and oriented X 3. Moves all extremities spontaneously. Psych:  Responds to questions appropriately with a normal affect.   Intake/Output Summary (Last 24 hours) at 03/30/15 0739 Last data filed at 03/30/15 0000  Gross per 24 hour  Intake   1540 ml  Output   1281 ml  Net    259 ml    Inpatient Medications:  . diltiazem  20 mg Intravenous Once  . famotidine  20 mg Oral BID  . ferrous sulfate  325 mg Oral Q breakfast  . insulin aspart  0-5 Units Subcutaneous QHS  . insulin aspart  0-9 Units Subcutaneous  TID WC  . insulin glargine  12 Units Subcutaneous Daily  . linagliptin  5 mg Oral Daily  . metoprolol tartrate  25 mg Oral BID  . mometasone-formoterol  2 puff Inhalation BID  . moxifloxacin  1 drop Both Eyes QID  . nystatin  5 mL Mouth/Throat QID  . rivaroxaban  20 mg Oral Daily  . senna  1 tablet Oral BID  . tiotropium  18 mcg Inhalation Daily   Infusions:  . sodium chloride 75 mL/hr at 03/30/15 0224    Labs:  Recent Labs  03/29/15 0929 03/30/15 0544  NA 137 140  K 3.9 4.0  CL 103 106  CO2 29 30  GLUCOSE 228* 235*  BUN 10 11  CREATININE 0.62 0.64  CALCIUM 8.5* 8.3*  MG 1.7 1.7   No results for input(s): AST, ALT, ALKPHOS, BILITOT, PROT, ALBUMIN in the last 72 hours.  Recent Labs  03/29/15 0404 03/30/15 0544  WBC 7.5 6.9  HGB 9.0* 8.8*  HCT 27.0* 26.4*  MCV 92.8 93.7  PLT 122* 160   No results for input(s): CKTOTAL, CKMB, TROPONINI in the last 72 hours. Invalid input(s): POCBNP  Recent Labs  03/28/15 0353  HGBA1C 6.9*     Weights: Filed Weights   03/25/15 2244 03/26/15 0317  Weight: 127 lb (57.607 kg) 130 lb 14.4 oz (59.376 kg)     Radiology/Studies:  Dg Chest 1 View  03/25/2015  CLINICAL DATA:  Left hip injury after a fall. History of COPD on oxygen. EXAM: CHEST 1  VIEW COMPARISON:  10/21/2013 FINDINGS: Diffuse emphysematous changes in the chest. Normal heart size and pulmonary vascularity. No focal airspace disease or consolidation in the lungs. No blunting of costophrenic angles. No pneumothorax. Mediastinal contours appear intact. Calcification of the aorta. IMPRESSION: Emphysematous changes in the lungs. No evidence of active pulmonary disease. Electronically Signed   By: Lucienne Capers M.D.   On: 03/25/2015 23:55   Dg Hip Unilat With Pelvis 1v Left  03/26/2015  CLINICAL DATA:  Postoperative left hip hemiarthroplasty. EXAM: DG HIP (WITH OR WITHOUT PELVIS) 1V*L* COMPARISON:  None. FINDINGS: Left hip hemi arthroplasty identified without  subluxation or dislocation. No acute bony abnormalities are present. No hardware complicating features are noted. Soft tissue postoperative changes are present. IMPRESSION: Left hip hemiarthroplasty without complicating features. Electronically Signed   By: Margarette Canada M.D.   On: 03/26/2015 18:29   Dg Hip Unilat With Pelvis 2-3 Views Left  03/25/2015  CLINICAL DATA:  79 year old female tripped and fell going to the bathroom with left hip pain. Initial encounter. EXAM: DG HIP (WITH OR WITHOUT PELVIS) 2-3V LEFT COMPARISON:  CT Abdomen and Pelvis 02/04/2014 FINDINGS: Comminuted left femoral neck fracture with varus impaction. The intertrochanteric segment appears to remain intact. The left femoral head remains articulating with the acetabulum. Small mildly displaced comminution fragments including that projecting adjacent to the lesser trochanter. Pelvis Intact. Proximal right femur appears intact. Stable small focus of dystrophic calcification in the medial proximal right thigh soft tissues. IMPRESSION: Comminuted left femoral neck fracture with varus impaction. Electronically Signed   By: Genevie Ann M.D.   On: 03/25/2015 23:50     Assessment and Plan  79 y.o. female with h/o paroxysmal tachy-palpitations, COPD with prn oxygen usage, DM2, SLE, history of rheumatic fever, MVP, and hypothyroidism who presented to [facility] on [facility]14 after suffering a mechanical fall leading to a closed left femoral neck fracture s/p hemiarthroplasty on 03/26/2015, subsequently developing new onset Afib with RVR on 10/16, followed by conversion to sinus rhythm on 10/17. She subsequently developed Afib with RVR overnight into 10/18.  1. New onset Afib with RVR: -She developed Afib with RVR on post-op day #2 (10/16), followed by conversion to NSR on 10/17 -She subsequently developed Afib with RVR overnight into 10/18 with heart rates into the 140's to 170's with increased agitation requiring PRN Haldol and Iv Cardizem  -Currently in  sinus rhythm with heart rates in the 90's -Transfer to 2A for increased cardiac monitoring and treatment  -BP at this time precludes titration of beta blocker for increased rate control -She has been anticoagulated as below -Would start amiodarone 400 mg bid x 1 week, 200 mg bid x 1 week, 200 mg daily there after in an effort to maintain sinus rhythm at this time -Check TSH and CMP -She will need baseline PFT and routine eye exam as an outpatient  -Likely exacerbated by femoral neck fracture -Continue Lopressor 25 mg bid -Echo showed EF 55-60%, technically difficult study, GR1DD -CHADSVASc at least 4 (age x 2, DM, female) -Per IM, ortho ok with full dose anticoagulation at this time -She has been started on Xarelto 20 mg q supper   2. Femoral neck fracture: -Status post hemiarthroplasty -Per ortho  3. COPD: -No exacerbation -PRN oxygen  4. DM2: -Per IM   Signed, Christell Faith, PA-C Pager: [phone] 03/30/2015, 7:39 AM

## 2015-03-30 NOTE — Progress Notes (Signed)
Dr Jannifer Franklin, MD on call notified of patient's HR 140's-150's, pt very agitated and combative also. BP 170/130. Cardizem 20mg  IV ordered, Elmo Putt- nursing supervisor administered. Also Haldol PRN ordered. Patient now NSR 80's, BP 117/75,  resting. Dr Jannifer Franklin updated, also notified Cardiology is not following patient and inquired about scheduled PO cardizem. Dr Jannifer Franklin stated that cardiology did see patient on Monday 03/29/2015 and adjusted scheduled metoprolol, stated for MD to follow up in the morning if this continues. Nursing continues to monitor.

## 2015-03-30 NOTE — Progress Notes (Signed)
Subjective: 4 Days Post-Op Procedure(s) (LRB): ARTHROPLASTY BIPOLAR HIP (HEMIARTHROPLASTY) (Left)    Patient reports pain as mild. Still weak from AFib.  hgb 8.8 Now on Xarelto for anticoagulation  Objective:   VITALS:   Filed Vitals:   03/30/15 1226  BP: 103/51  Pulse: 94  Temp:   Resp:     Neurologically intact Neurovascular intact Sensation intact distally Intact pulses distally Dorsiflexion/Plantar flexion intact Incision: scant drainage  LABS  Recent Labs  03/28/15 0353 03/29/15 0404 03/30/15 0544  HGB 9.9* 9.0* 8.8*  HCT 28.9* 27.0* 26.4*  WBC 8.3 7.5 6.9  PLT 118* 122* 160     Recent Labs  03/28/15 0353 03/29/15 0929 03/30/15 0544  NA 141 137 141  140  K 3.6 3.9 4.0  4.0  BUN 11 10 10  11   CREATININE 0.60 0.62 0.71  0.64  GLUCOSE 186* 228* 234*  235*    No results for input(s): LABPT, INR in the last 72 hours.   Assessment/Plan: 4 Days Post-Op Procedure(s) (LRB): ARTHROPLASTY BIPOLAR HIP (HEMIARTHROPLASTY) (Left)    SNF when stable Change dressing today

## 2015-03-30 NOTE — Progress Notes (Signed)
Physical Therapy Treatment Patient Details Name: [name] MRN: [id number] DOB: July 01, 1935 Today's Date: 03/30/2015    History of Present Illness Pt is a 79 yo femle who sustained a femoral neck fracture and now POD #2 L hemi arthroplasty.      PT Comments    Pt lethargic/fatigued, but agreeable to exercises. Pt notes she had difficulty at lunch attempting to sit edge of bed with increased heart rate. Pt notes nursing wishes pt to limit activity. Session limited to bed exercises; pt's heart rate between 66 and 75 beats per minute. Improved recall of posterior hip precautions; remembers 2 of 3. Continue PT for progression of strength, endurance and functional mobility with safe heart rate ranges.   Follow Up Recommendations  SNF     Equipment Recommendations  Rolling walker with 5" wheels    Recommendations for Other Services       Precautions / Restrictions Precautions Precautions: Posterior Hip;Fall Restrictions Weight Bearing Restrictions: Yes LLE Weight Bearing: Partial weight bearing    Mobility  Bed Mobility               General bed mobility comments: Not tested; see noted  Transfers                    Ambulation/Gait                 Stairs            Wheelchair Mobility    Modified Rankin (Stroke Patients Only)       Balance                                    Cognition Arousal/Alertness: Lethargic Behavior During Therapy: WFL for tasks assessed/performed Overall Cognitive Status: Within Functional Limits for tasks assessed       Memory: Decreased recall of precautions (remembers 2 of 3)              Exercises Total Joint Exercises Ankle Circles/Pumps: AROM;Both;20 reps;Supine Quad Sets: Strengthening;Both;20 reps;Supine Gluteal Sets: Strengthening;Both;20 reps;Supine Towel Squeeze: Strengthening;Both;20 reps;Supine Short Arc Quad: Both;AROM;20 reps;Supine Heel Slides: AAROM;Left;20  reps;Supine (AROM R) Hip ABduction/ADduction: AAROM;Left;20 reps;Supine (AROM R) Straight Leg Raises: AAROM;20 reps;Supine;Both    General Comments        Pertinent Vitals/Pain Pain Assessment: 0-10 Pain Score: 3  Pain Intervention(s): Limited activity within patient's tolerance    Home Living                      Prior Function            PT Goals (current goals can now be found in the care plan section) Progress towards PT goals: Progressing toward goals (slowly due to A fib complications )    Frequency  BID    PT Plan Current plan remains appropriate    Co-evaluation             End of Session Equipment Utilized During Treatment: Oxygen Activity Tolerance: Patient limited by fatigue;Patient limited by lethargy Patient left: in bed;with call bell/phone within reach;with bed alarm set     Time: 1326-1350 PT Time Calculation (min) (ACUTE ONLY): 24 min  Charges:  $Therapeutic Exercise: 23-37 mins                    G Codes:      [name] 03/30/2015, 1:52 PM

## 2015-03-30 NOTE — Consult Note (Signed)
Received [facility] consult for care management.[name] has been engaged by [facility] care management for  health coach services follow up recently.   I spoke with MadisonPfeifle at the bedside, she states that she has too much going on right now, and did not want to talk about anything else right now and that she won't  be at home for a few weeks as she is going  for rehab after her surgery  at discharge she stated that it will be best if we contact her again in a few weeks. I assured her that we can follow up with her at a later time, I left our information packet and contact telephone number with her. I will alert  Charise Killian, at [facility] being inpatient.   Of note, [facility] Care Management services does not replace or interfere with any services that are arranged by inpatient case management or social work. For additional questions or referrals please contact     Joylene Draft, RN, [place] Management/Hospital Liaison [phone]- Mobile [phone]- [place]

## 2015-03-31 DIAGNOSIS — R0602 Shortness of breath: Secondary | ICD-10-CM | POA: Insufficient documentation

## 2015-03-31 DIAGNOSIS — E118 Type 2 diabetes mellitus with unspecified complications: Secondary | ICD-10-CM

## 2015-03-31 DIAGNOSIS — S72002A Fracture of unspecified part of neck of left femur, initial encounter for closed fracture: Secondary | ICD-10-CM

## 2015-03-31 DIAGNOSIS — I5031 Acute diastolic (congestive) heart failure: Secondary | ICD-10-CM

## 2015-03-31 LAB — GLUCOSE, CAPILLARY
GLUCOSE-CAPILLARY: 217 mg/dL — AB (ref 65–99)
GLUCOSE-CAPILLARY: 95 mg/dL (ref 65–99)
GLUCOSE-CAPILLARY: 280 mg/dL — AB (ref 65–99)
Glucose-Capillary: 212 mg/dL — ABNORMAL HIGH (ref 65–99)

## 2015-03-31 LAB — TSH: TSH: 1.308 u[IU]/mL (ref 0.350–4.500)

## 2015-03-31 LAB — COMPREHENSIVE METABOLIC PANEL
ALBUMIN: 2.6 g/dL — AB (ref 3.5–5.0)
ALT: 24 U/L (ref 14–54)
ANION GAP: 5 (ref 5–15)
AST: 30 U/L (ref 15–41)
Alkaline Phosphatase: 60 U/L (ref 38–126)
BUN: 18 mg/dL (ref 6–20)
CO2: 31 mmol/L (ref 22–32)
Calcium: 8.6 mg/dL — ABNORMAL LOW (ref 8.9–10.3)
Chloride: 105 mmol/L (ref 101–111)
Creatinine, Ser: 0.67 mg/dL (ref 0.44–1.00)
GFR calc Af Amer: >60 mL/min (ref >60)
GFR calc non Af Amer: >60 mL/min (ref >60)
GLUCOSE: 263 mg/dL — AB (ref 65–99)
POTASSIUM: 4.3 mmol/L (ref 3.5–5.1)
SODIUM: 141 mmol/L (ref 135–145)
TOTAL PROTEIN: 5.7 g/dL — AB (ref 6.5–8.1)
Total Bilirubin: 0.3 mg/dL (ref 0.3–1.2)

## 2015-03-31 LAB — SURGICAL PATHOLOGY

## 2015-03-31 MED ORDER — POLYETHYLENE GLYCOL 3350 17 G PO PACK
17.0000 g | PACK | Freq: Every day | ORAL | Status: DC
  Administered 2015-03-31 – 2015-04-01 (×2): 17 g via ORAL
  Filled 2015-03-31 (×2): qty 1

## 2015-03-31 MED ORDER — AMIODARONE HCL 200 MG PO TABS
400.0000 mg | ORAL_TABLET | Freq: Two times a day (BID) | ORAL | Status: DC
  Administered 2015-03-31 – 2015-04-01 (×2): 400 mg via ORAL
  Filled 2015-03-31 (×2): qty 2

## 2015-03-31 MED ORDER — LORAZEPAM 0.5 MG PO TABS
0.5000 mg | ORAL_TABLET | Freq: Two times a day (BID) | ORAL | Status: DC | PRN
  Administered 2015-03-31: 0.5 mg via ORAL
  Filled 2015-03-31: qty 1

## 2015-03-31 MED ORDER — FUROSEMIDE 10 MG/ML IJ SOLN
40.0000 mg | Freq: Four times a day (QID) | INTRAMUSCULAR | Status: AC
  Administered 2015-03-31 (×2): 40 mg via INTRAVENOUS
  Filled 2015-03-31 (×2): qty 4

## 2015-03-31 MED ORDER — GLUCERNA SHAKE PO LIQD
237.0000 mL | Freq: Two times a day (BID) | ORAL | Status: DC
  Administered 2015-03-31 – 2015-04-01 (×2): 237 mL via ORAL

## 2015-03-31 NOTE — Care Management (Signed)
Barriers to discharge:  continued need to monitor cardiac status due to recent changes in cardiac meds.  Required IV lasix  this day for exac of chf.

## 2015-03-31 NOTE — Progress Notes (Signed)
[place] at [place] NAME: [name]    MR#:  [id number]  DATE OF BIRTH:  1935/10/06  SUBJECTIVE: Feeling short of breath today morning. Receiving IV Lasix. No chest pain. Heart rate is controlled. Complains of constipation, no bowel movement for 4 days.   CHIEF COMPLAINT:  Admitted for left femoral neck fracture status post surgery, scheduled to go to a skilled nursing facility when hemodynamically stable. patient had A. fib with RVR needing IV Cardizem drip, now she is on amiodarone. He had shortness of breath with acute diastolic heart failure, she is on IV Lasix at this time.  REVIEW OF SYSTEMS:  CONSTITUTIONAL: No fever, fatigue or weakness.  EYES: No blurred or double vision.  EARS, NOSE, AND THROAT: No tinnitus or ear pain.  RESPIRATORY: Has shortness of breath today. CARDIOVASCULAR: Palpitations are improved. No chest pain, orthopnea, edema.  GASTROINTESTINAL: No nausea, vomiting, diarrhea or abdominal pain. Does have constipation. GENITOURINARY: No dysuria, hematuria.  ENDOCRINE: No polyuria, nocturia,  HEMATOLOGY: No anemia, easy bruising or bleeding SKIN: No rash or lesion. MUSCULOSKELETAL: left hip pain .   NEUROLOGIC: No tingling, numbness, weakness.  PSYCHIATRY: No anxiety or depression.   DRUG ALLERGIES:   Allergies  Allergen Reactions  . Codeine Nausea And Vomiting  . Penicillins Hives  . Sulfa Antibiotics   . Doxycycline Rash  . Erythromycin Rash  . Latex Rash    VITALS:  Blood pressure 138/60, pulse 83, temperature 98.1 F (36.7 C), temperature source Oral, resp. rate 20, height 5\' 4"  (1.626 m), weight 64.864 kg (143 lb), SpO2 100 %.  PHYSICAL EXAMINATION:  GENERAL:  79 y.o.-year-old patient lying in the bed with no acute distress.  EYES: Pupils equal, round, reactive to light and accommodation. No scleral icterus. Extraocular muscles intact.  HEENT: Head atraumatic, normocephalic. Oropharynx and  nasopharynx clear.  NECK:  Supple, no jugular venous distention. No thyroid enlargement, no tenderness.  LUNGS bilateral basilar crepitations present. CARDIOVASCULAR: Irregularly irregular. No murmurs, rubs, or gallops.  ABDOMEN: Soft, nontender, nondistended. Bowel sounds present. No organomegaly or mass.  EXTREMITIES: Left lower extremity in a cast .No pedal edema, cyanosis, or clubbing.  NEUROLOGIC: Cranial nerves II through XII are intact. Muscle strength 5/5 in all extremities except lle, with clean dressing.  Sensation intact. Gait not checked.  PSYCHIATRIC: The patient is alert and oriented x 3.  SKIN: No obvious rash, lesion, or ulcer.    LABORATORY PANEL:   CBC  Recent Labs Lab 03/30/15 0544  WBC 6.9  HGB 8.8*  HCT 26.4*  PLT 160   ------------------------------------------------------------------------------------------------------------------  Chemistries   Recent Labs Lab 03/30/15 0544 03/31/15 0447  NA 141  140 141  K 4.0  4.0 4.3  CL 107  106 105  CO2 30  30 31   GLUCOSE 234*  235* 263*  BUN 10  11 18   CREATININE 0.71  0.64 0.67  CALCIUM 8.4*  8.3* 8.6*  MG 1.7  --   AST 26 30  ALT 19 24  ALKPHOS 56 60  BILITOT 0.3 0.3   ------------------------------------------------------------------------------------------------------------------  Cardiac Enzymes No results for input(s): TROPONINI in the last 168 hours. ------------------------------------------------------------------------------------------------------------------  RADIOLOGY:  No results found.  EKG:   Orders placed or performed during the hospital encounter of 03/25/15  . EKG 12-Lead  . EKG 12-Lead  . EKG 12-Lead  . EKG 12-Lead  . EKG 12-Lead  . EKG 12-Lead  . EKG 12-Lead  . EKG 12-Lead  ASSESSMENT AND PLAN:   #New-onset A. fib with RVR D/ced Cardizem drip On amiodarone by mouth 400 mg twice a day, metoprolol, Xarelto. CHADSVASc at least 4 (age x 2, DM,  female)  *Acute Diastolic heart failure secondary to A. fib with RVR,  IV fluids, COPD. EF more than 55%. IV Lasix today for 2 doses. Hold IV fluids.   # Femoral neck fracture, left, closed, initial encounter -   S/p left hip hemiarthroplasty pod #4 Pain rx per ortho recommendations. F/U with PT , recommending SNF, accepted bed at the [place] home.   # Severe COPD -  No exacerbation continue home inhalers Dulera, Spiriva, and albuterol nebulizer as needed.  Chronic respiratory failure: Patient to use 2 L of oxygen all the time at home.  # Diabetes mellitus, type 2 ([place]) -  on Januvia at home. resume Januvia  sliding scale insulin with  fingerstick glucose checks every 6 hours    carb modified diet.  hemoglobin A1c 6.9 Adding lantus 12 u once daily for basal coverage   # Anxiety - Continue home meds   #GERD (gastroesophageal reflux disease) - PPI while here   #Oral thrush-nystatin swish and swallow  All the records are reviewed and case discussed with ED provider. Management plans discussed with the patient and/or family.  DVT PROPHYLAXIS: started xarelto  Possible discharge tomorrow to SNF  All the records are reviewed and case discussed with Care Management/Social Workerr. Management plans discussed with the patient, family and they are in agreement.  CODE STATUS: full code  TOTAL CRITICAL CARE TIME TAKING CARE OF THIS PATIENT: 35 minutes.   POSSIBLE D/C IN 2-3DAYS, DEPENDING ON CLINICAL CONDITION.   Epifanio Lesches M.D on 03/31/2015 at 10:09 AM  Between 7am to 6pm - Pager - [phone] After 6pm go to [email or web address] - password [email or web address] [facility] [facility]  Office  [phone]  CC: Primary care physician; Halina Maidens, MD

## 2015-03-31 NOTE — Progress Notes (Signed)
Patient: [name] / Admit Date: 03/25/2015 / Date of Encounter: 03/31/2015, 8:57 AM   Subjective: Patient converted to normal sinus rhythm yesterday on amiodarone pill,   IV fluids held yesterday out of concern of heart failure This morning at 5 AM, increasingly short of breath, increasing work of breathing Even this morning, increasing shortness of breath  Abdomen feels distended, no recent bowel movement Still using a bedpan, working with PT  Review of Systems: Review of Systems  Respiratory: Positive for cough and shortness of breath.   Cardiovascular: Positive for PND.  Gastrointestinal: Positive for constipation.       Distended  Musculoskeletal: Positive for joint pain.  Neurological: Positive for weakness.  Psychiatric/Behavioral: Negative.   All other systems reviewed and are negative.   Objective: Telemetry: Currently sinus rhythm in the 90's. Went into Afib with RVR AT 1:02 AM with HR ranging from the 140's to 170's.  Physical Exam: Blood pressure 138/60, pulse 83, temperature 98.1 F (36.7 C), temperature source Oral, resp. rate 20, height 5\' 4"  (1.626 m), weight 143 lb (64.864 kg), SpO2 100 %. Body mass index is 24.53 kg/(m^2). General: Well developed, well nourished, appears short of breath with talking, on high flow nasal cannula oxygen Head: Normocephalic, atraumatic, sclera non-icteric, no xanthomas, nares are without discharge. Neck: Negative for carotid bruits. JVP not elevated. Lungs: Decreased breath sounds throughout, scant crackles at the bases Heart: RRR S1 S2 without murmurs, rubs, or gallops.  Abdomen: Soft, non-tender, non-distended with normoactive bowel sounds. No rebound/guarding. Extremities: No clubbing or cyanosis. No edema. Distal pedal pulses are 2+ and equal bilaterally. Neuro: Alert and oriented X 3. Moves all extremities spontaneously. Psych:  Responds to questions appropriately with a normal affect.   Intake/Output Summary  (Last 24 hours) at 03/31/15 0857 Last data filed at 03/31/15 0701  Gross per 24 hour  Intake    240 ml  Output   1150 ml  Net   -910 ml    Inpatient Medications:  . amiodarone  400 mg Oral BID  . famotidine  20 mg Oral BID  . ferrous sulfate  325 mg Oral Q breakfast  . furosemide  40 mg Intravenous Q6H  . insulin aspart  0-5 Units Subcutaneous QHS  . insulin aspart  0-9 Units Subcutaneous TID WC  . insulin glargine  12 Units Subcutaneous Daily  . linagliptin  5 mg Oral Daily  . metoprolol tartrate  25 mg Oral BID  . mometasone-formoterol  2 puff Inhalation BID  . moxifloxacin  1 drop Both Eyes QID  . nystatin  5 mL Mouth/Throat QID  . phenol-menthol  1 lozenge Buccal QID  . polyethylene glycol  17 g Oral Daily  . rivaroxaban  20 mg Oral Daily  . senna  1 tablet Oral BID  . tiotropium  18 mcg Inhalation Daily   Infusions:     Labs:  Recent Labs  03/29/15 0929 03/30/15 0544 03/31/15 0447  NA 137 141  140 141  K 3.9 4.0  4.0 4.3  CL 103 107  106 105  CO2 29 30  30 31   GLUCOSE 228* 234*  235* 263*  BUN 10 10  11 18   CREATININE 0.62 0.71  0.64 0.67  CALCIUM 8.5* 8.4*  8.3* 8.6*  MG 1.7 1.7  --     Recent Labs  03/30/15 0544 03/31/15 0447  AST 26 30  ALT 19 24  ALKPHOS 56 60  BILITOT 0.3 0.3  PROT 5.3*  5.7*  ALBUMIN 2.4* 2.6*    Recent Labs  03/29/15 0404 03/30/15 0544  WBC 7.5 6.9  HGB 9.0* 8.8*  HCT 27.0* 26.4*  MCV 92.8 93.7  PLT 122* 160   No results for input(s): CKTOTAL, CKMB, TROPONINI in the last 72 hours. Invalid input(s): POCBNP No results for input(s): HGBA1C in the last 72 hours.   Weights: Filed Weights   03/25/15 2244 03/26/15 0317 03/30/15 0859  Weight: 127 lb (57.607 kg) 130 lb 14.4 oz (59.376 kg) 143 lb (64.864 kg)     Radiology/Studies:  Dg Chest 1 View  03/25/2015  CLINICAL DATA:  Left hip injury after a fall. History of COPD on oxygen. EXAM: CHEST 1 VIEW COMPARISON:  10/21/2013 FINDINGS: Diffuse emphysematous  changes in the chest. Normal heart size and pulmonary vascularity. No focal airspace disease or consolidation in the lungs. No blunting of costophrenic angles. No pneumothorax. Mediastinal contours appear intact. Calcification of the aorta. IMPRESSION: Emphysematous changes in the lungs. No evidence of active pulmonary disease. Electronically Signed   By: Lucienne Capers M.D.   On: 03/25/2015 23:55   Dg Hip Unilat With Pelvis 1v Left  03/26/2015  CLINICAL DATA:  Postoperative left hip hemiarthroplasty. EXAM: DG HIP (WITH OR WITHOUT PELVIS) 1V*L* COMPARISON:  None. FINDINGS: Left hip hemi arthroplasty identified without subluxation or dislocation. No acute bony abnormalities are present. No hardware complicating features are noted. Soft tissue postoperative changes are present. IMPRESSION: Left hip hemiarthroplasty without complicating features. Electronically Signed   By: Margarette Canada M.D.   On: 03/26/2015 18:29   Dg Hip Unilat With Pelvis 2-3 Views Left  03/25/2015  CLINICAL DATA:  79 year old female tripped and fell going to the bathroom with left hip pain. Initial encounter. EXAM: DG HIP (WITH OR WITHOUT PELVIS) 2-3V LEFT COMPARISON:  CT Abdomen and Pelvis 02/04/2014 FINDINGS: Comminuted left femoral neck fracture with varus impaction. The intertrochanteric segment appears to remain intact. The left femoral head remains articulating with the acetabulum. Small mildly displaced comminution fragments including that projecting adjacent to the lesser trochanter. Pelvis Intact. Proximal right femur appears intact. Stable small focus of dystrophic calcification in the medial proximal right thigh soft tissues. IMPRESSION: Comminuted left femoral neck fracture with varus impaction. Electronically Signed   By: Genevie Ann M.D.   On: 03/25/2015 23:50     Assessment and Plan  79 y.o. female with h/o paroxysmal tachy-palpitations, COPD with prn oxygen usage, DM2, SLE, history of rheumatic fever, MVP, and  hypothyroidism who presented to [facility] on 10/14 after suffering a mechanical fall leading to a closed left femoral neck fracture s/p hemiarthroplasty on 03/26/2015, subsequently developing new onset Afib with RVR on 10/16, followed by conversion to sinus rhythm on 10/17. She subsequently developed Afib with RVR overnight into 10/18.  1. New onset Afib with RVR: Converted back to normal sinus rhythm yesterday We'll change amiodarone pill to 400 mg twice a day to maintain normal sinus rhythm High risk of converting back to atrial fibrillation given her CHF symptoms,copd, pain --continue anticoagulation, metoprolol twice a day, amiodarone twice a day  2) acute diastolic CHF Secondary to IV fluids, atrial fibrillation with RVR, in the setting of COPD Echo showing normal ejection fraction, diastolic dysfunction Now in normal sinus rhythm on amiodarone IV fluids held yesterday Will give Lasix 40 mg IV now and in 6 hours, 2 doses  3. Femoral neck fracture: -Status post hemiarthroplasty -Per ortho  3. copd -No exacerbation -PRN oxygen  4. DM2: -Per IM  Signed, Esmond Plants, MD [facility] 03/31/2015, 8:57 AM

## 2015-03-31 NOTE — Progress Notes (Signed)
Inpatient Diabetes Program Recommendations  AACE/ADA: New Consensus Statement on Inpatient Glycemic Control (2015)  Target Ranges:  Prepandial:   less than 140 mg/dL      Peak postprandial:   less than 180 mg/dL (1-2 hours)      Critically ill patients:  140 - 180 mg/dL   Review of Glycemic Control:  Results for MALLISSA, LORENZEN (MRN [id number]) as of 03/31/2015 10:40  Ref. Range 03/30/2015 07:18 03/30/2015 12:18 03/30/2015 16:32 03/30/2015 21:22 03/31/2015 07:42  Glucose-Capillary Latest Ref Range: 65-99 mg/dL 214 (H) 254 (H) 221 (H) 198 (H) 212 (H)   Diabetes history:  Type 2 diabetes Outpatient Diabetes medications: Januvia 50 mg daily Current orders for Inpatient glycemic control:  Lantus 12 units daily, Novolog sensitive tid with meals and HS, Tradjenta 5 mg daily  Inpatient Diabetes Program Recommendations:   Please consider increasing Lantus to 16 units daily.  Also consider increasing Novolog correction to moderate tid with meals.  Thanks, Adah Perl, RN, BC-ADM Inpatient Diabetes Coordinator Pager [phone] (8a-5p)

## 2015-03-31 NOTE — Progress Notes (Signed)
PT Cancellation Note  Patient Details Name: [name] MRN: [id number] DOB: Mar 25, 1936   Cancelled Treatment:    Reason Eval/Treat Not Completed: Medical issues which prohibited therapy. Treatment attempted; pt in bed supine grasping B bed rails. Pt notes she is having a difficult time breathing due to increased fluid retention and and inability to move bowels. Pt notes she has begun medications to rectify problem; pt pleasant and wishes to try therapy later in the day as tolerated. Agreed to check on patient post lunch.    Erline Levine Bishop 03/31/2015, 11:28 AM

## 2015-03-31 NOTE — Patient Outreach (Signed)
[place] [facility]) Care Management  03/31/2015  Donnarae Rae [birth year] [id number]   Referral from [facility], assigned Janci Minor, RN to outreach for [place] Management services.  Thanks, Ronnell Freshwater. [place], [place] Assistant Phone: [phone] Fax: [phone]

## 2015-03-31 NOTE — Plan of Care (Signed)
Problem: Phase I Progression Outcomes Goal: Post op hemodynamically stable Outcome: Progressing Pt remained NSR 70-90s with PVCs and PACs, BP 110-160/40-50s, Pt on 3L [place] O2.

## 2015-03-31 NOTE — Progress Notes (Signed)
Subjective: 5 Days Post-Op Procedure(s) (LRB): ARTHROPLASTY BIPOLAR HIP (HEMIARTHROPLASTY) (Left)    Patient reports pain as mild. Still SOB so getting some Lasix.  Dr Karl Bales attending to this issue.    Objective:   VITALS:   Filed Vitals:   03/31/15 1434  BP: 149/60  Pulse: 85  Temp:   Resp:     Neurologically intact Neurovascular intact Sensation intact distally Intact pulses distally Dorsiflexion/Plantar flexion intact Dressing dry.  Hip stable.   LABS  Recent Labs  03/29/15 0404 03/30/15 0544  HGB 9.0* 8.8*  HCT 27.0* 26.4*  WBC 7.5 6.9  PLT 122* 160     Recent Labs  03/29/15 0929 03/30/15 0544 03/31/15 0447  NA 137 141  140 141  K 3.9 4.0  4.0 4.3  BUN 10 10  11 18   CREATININE 0.62 0.71  0.64 0.67  GLUCOSE 228* 234*  235* 263*    No results for input(s): LABPT, INR in the last 72 hours.   Assessment/Plan: 5 Days Post-Op Procedure(s) (LRB): ARTHROPLASTY BIPOLAR HIP (HEMIARTHROPLASTY) (Left)   Up with therapy Discharge to SNF when stable. RTC 10 days after D/C

## 2015-03-31 NOTE — Progress Notes (Signed)
Initial Nutrition Assessment   INTERVENTION:   Meals and Snacks: Cater to patient preferences Medical Food Supplement Therapy: will recommend Glucerna Shake po BID, each supplement provides 220 kcal and 10 grams of protein, for added nutrition    NUTRITION DIAGNOSIS:   Inadequate oral intake related to acute illness as evidenced by per patient/family report.  GOAL:   Patient will meet greater than or equal to 90% of their needs  MONITOR:    (Energy Intake, Electrolyte and Renal Profile, Glucose Profile, Anthropometrics)  REASON FOR ASSESSMENT:   LOS    ASSESSMENT:   Pt admitted with left hip fracture d/t mechanical fall s/p left hip hemiarthroplasty 10/14. Per MD note, pt with SOB with acute diastolic hear failure on IV Lasix.  Past Medical History  Diagnosis Date  . DM (diabetes mellitus) ([place])   . Mitral valve prolapse   . COPD (chronic obstructive pulmonary disease) ([place])   . Palpitations   . Osteoarthritis   . Systemic lupus erythematosus ([place])   . Lumbar spondylolysis   . Raynaud's disease   . Anxiety   . IBS (irritable bowel syndrome)   . Uveitis, anterior     ????  . Rheumatic fever   . Ankylosing spondylitis ([place])     ? how diagnosed  . Insomnia, unspecified   . Chronic peptic ulcer, unspecified site, without mention of hemorrhage, perforation, or obstruction   . Paroxysmal tachycardia, unspecified ([place])   . History of kidney stones   . Chronic kidney infection   . Asthma     as child  . Pneumonia   . PONV (postoperative nausea and vomiting)   . Hypothyroidism     mass, U/S 6/21 - nodules  . Motion sickness     car - back seat  . GERD (gastroesophageal reflux disease)   . Wears dentures     full upper, partial lower  . Wears contact lenses   . Oxygen decrease     2l prn  . Raynaud disease   . Headache   . Neuropathy ([place])   . Wheezing      Diet Order:  Diet Carb Modified Fluid consistency:: Thin; Room service appropriate?: Yes     Current Nutrition: Pt reports secondary to lasix, has not been able to eat much today as pt c/o needing to urinate so frequently. Recorded po intake 50% of breakfast this am. Pt reports poor po intake since admission.   Food/Nutrition-Related History: Pt reports good appetite PTA at home eating 3 meals per day and likes chocolate Ensure.   Scheduled Medications:  . amiodarone  400 mg Oral BID  . famotidine  20 mg Oral BID  . feeding supplement (GLUCERNA SHAKE)  237 mL Oral BID WC  . ferrous sulfate  325 mg Oral Q breakfast  . insulin aspart  0-5 Units Subcutaneous QHS  . insulin aspart  0-9 Units Subcutaneous TID WC  . insulin glargine  12 Units Subcutaneous Daily  . linagliptin  5 mg Oral Daily  . metoprolol tartrate  25 mg Oral BID  . mometasone-formoterol  2 puff Inhalation BID  . moxifloxacin  1 drop Both Eyes QID  . nystatin  5 mL Mouth/Throat QID  . phenol-menthol  1 lozenge Buccal QID  . polyethylene glycol  17 g Oral Daily  . rivaroxaban  20 mg Oral Daily  . senna  1 tablet Oral BID  . tiotropium  18 mcg Inhalation Daily     Electrolyte/Renal Profile and Glucose Profile:  Recent Labs Lab 03/29/15 0929 03/30/15 0544 03/31/15 0447  NA 137 141  140 141  K 3.9 4.0  4.0 4.3  CL 103 107  106 105  CO2 29 30  30 31   BUN 10 10  11 18   CREATININE 0.62 0.71  0.64 0.67  CALCIUM 8.5* 8.4*  8.3* 8.6*  MG 1.7 1.7  --   GLUCOSE 228* 234*  235* 263*   Protein Profile:  Recent Labs Lab 03/25/15 2316 03/30/15 0544 03/31/15 0447  ALBUMIN 4.0 2.4* 2.6*    Gastrointestinal Profile: Last BM:  03/29/2015   Weight Change: Pt reports weight gain in the last few months. Pt reports at one time weighing 98lbs but since thyroid medication change a few months ago has been gaining weight back.   Height:   Ht Readings from Last 1 Encounters:  03/26/15 5\' 4"  (1.626 m)    Weight:   Wt Readings from Last 1 Encounters:  03/30/15 143 lb (64.864 kg)    Wt  Readings from Last 10 Encounters:  03/30/15 143 lb (64.864 kg)  03/23/15 123 lb (55.792 kg)  02/24/15 123 lb (55.792 kg)  02/24/15 123 lb 3.2 oz (55.883 kg)  12/11/14 122 lb (55.339 kg)  08/18/13 111 lb 4 oz (50.463 kg)  05/27/13 110 lb 8 oz (50.122 kg)  04/24/13 111 lb 12 oz (50.689 kg)  04/18/13 110 lb (49.896 kg)     BMI:  Body mass index is 24.53 kg/(m^2).   EDUCATION NEEDS:   No education needs identified at this time   Katalia Choma [place], [place], LDN Pager [phone]

## 2015-03-31 NOTE — Progress Notes (Signed)
Physical Therapy Treatment Patient Details Name: [name] MRN: [id number] DOB: 1936/04/29 Today's Date: 03/31/2015    History of Present Illness Pt is a 79 yo femle who sustained a femoral neck fracture and now POD #2 L hemi arthroplasty.      PT Comments    Patient expresses desire to attempt mobility in this session and with assistance during transfers, she is able to ambulate 40' with RW prior to fatiguing. She is able to complete increased step lengths with cuing and no loss of balance. Patient presents with decreased bed mobility and transfer skills secondary to LLE weakness and pain, though this had improved significantly in this session from previous sessions. Patient demonstrates improved LE strength and postural control, though she does display some mild posterior lean in sitting. Patient appears to be progressing well towards established goals and will continue to benefit from skilled PT services to address her mobility deficits.   Follow Up Recommendations  SNF     Equipment Recommendations  Rolling walker with 5" wheels    Recommendations for Other Services OT consult     Precautions / Restrictions Precautions Precautions: Posterior Hip;Fall Restrictions Weight Bearing Restrictions: Yes LLE Weight Bearing: Partial weight bearing    Mobility  Bed Mobility Overal bed mobility: Needs Assistance Bed Mobility: Supine to Sit     Supine to sit: Min assist     General bed mobility comments: Patient requires prolonged time, however she is able to bring her LEs off the edge of the bed and with significant use of handrails can perform supine to sit transfer.   Transfers Overall transfer level: Needs assistance Equipment used: Rolling walker (2 wheeled) Transfers: Sit to/from Stand Sit to Stand: Min assist;Mod assist         General transfer comment: Patient with poor hand placement and seems anxious to arise to standing, however she provides good effort  bilaterally with no loss of balance in transfer, initially in standing she displayed excessive swaying. She was able to correct by centering her COM over her LEs.   Ambulation/Gait Ambulation/Gait assistance: Min guard Ambulation Distance (Feet): 15 Feet Assistive device: Rolling walker (2 wheeled) Gait Pattern/deviations: Decreased step length - right;Decreased step length - left   Gait velocity interpretation: Below normal speed for age/gender General Gait Details: Patient demonstrates significantly decreased step lengths bilaterally initially, no increase in pain noted. She is able to increase her step lengths with cuing, however she fatigues quickly. No loss of balance or desaturation noted throughout ambulation.    Stairs            Wheelchair Mobility    Modified Rankin (Stroke Patients Only)       Balance Overall balance assessment: Needs assistance Sitting-balance support: Feet unsupported Sitting balance-Leahy Scale: Fair Sitting balance - Comments: Patient initially leans posteriorly, however she is able to correct once HOB is decreased in angle.  Postural control: Posterior lean (Until head of bed brought to neutral) Standing balance support: Bilateral upper extremity supported Standing balance-Leahy Scale: Fair Standing balance comment: No loss of balance and able to navigate RW in the room.                     Cognition Arousal/Alertness: Awake/alert Behavior During Therapy: WFL for tasks assessed/performed Overall Cognitive Status: Within Functional Limits for tasks assessed       Memory: Decreased recall of precautions (Patient required reminder of her PWB status)  Exercises Total Joint Exercises Ankle Circles/Pumps: AROM;Both;20 reps;Supine Heel Slides: AROM;AAROM;Right;Left;15 reps Straight Leg Raises: AROM;AAROM;10 reps Long Arc Quad: AROM;Both;15 reps    General Comments General comments (skin integrity, edema, etc.):  Inspected incision, appears intact and appropriate.       Pertinent Vitals/Pain Pain Assessment:  (Patient reports she is sore (like a muscle sore) but is very motivated to attempt OOB mobility. ) Pain Intervention(s): Limited activity within patient's tolerance;Monitored during session;Repositioned    Home Living                      Prior Function            PT Goals (current goals can now be found in the care plan section) Acute Rehab PT Goals Patient Stated Goal: Patient would like to go home, understands she may need rehab first.  PT Goal Formulation: With patient/family Time For Goal Achievement: 04/10/15 Potential to Achieve Goals: Good Progress towards PT goals: Progressing toward goals    Frequency  BID    PT Plan Current plan remains appropriate    Co-evaluation             End of Session Equipment Utilized During Treatment: Oxygen;Gait belt Activity Tolerance: Patient tolerated treatment well Patient left: in chair;with call bell/phone within reach;with family/visitor present (Notified RN of need for chair alarm)     Time: 2248-2500 PT Time Calculation (min) (ACUTE ONLY): 24 min  Charges:  $Gait Training: 8-22 mins $Therapeutic Exercise: 8-22 mins                    G Codes:      Kerman Passey, PT, DPT    03/31/2015, 2:46 PM

## 2015-03-31 NOTE — Progress Notes (Signed)
Speech Therapy Note:  Reviewed chart and consulted NSG.  Pt had finished eating lunch when ST arrived and reported concern of a cough, but stated she "doesn't think it's different from baseline" when asked.  ST observed a congested cough, independent of PO intake.  Pt is currently on Lasix and felt too full "of water" to consume any PO trials with ST. Will f/u to observe Pt directly with POs in order to better assess toleration of diet.  NSG updated.

## 2015-04-01 DIAGNOSIS — Z741 Need for assistance with personal care: Secondary | ICD-10-CM | POA: Diagnosis not present

## 2015-04-01 DIAGNOSIS — I4891 Unspecified atrial fibrillation: Secondary | ICD-10-CM | POA: Diagnosis not present

## 2015-04-01 DIAGNOSIS — F419 Anxiety disorder, unspecified: Secondary | ICD-10-CM | POA: Diagnosis not present

## 2015-04-01 DIAGNOSIS — J449 Chronic obstructive pulmonary disease, unspecified: Secondary | ICD-10-CM | POA: Diagnosis not present

## 2015-04-01 DIAGNOSIS — R262 Difficulty in walking, not elsewhere classified: Secondary | ICD-10-CM | POA: Diagnosis not present

## 2015-04-01 DIAGNOSIS — E119 Type 2 diabetes mellitus without complications: Secondary | ICD-10-CM | POA: Diagnosis not present

## 2015-04-01 DIAGNOSIS — I5032 Chronic diastolic (congestive) heart failure: Secondary | ICD-10-CM | POA: Diagnosis not present

## 2015-04-01 DIAGNOSIS — S72002D Fracture of unspecified part of neck of left femur, subsequent encounter for closed fracture with routine healing: Secondary | ICD-10-CM | POA: Diagnosis not present

## 2015-04-01 DIAGNOSIS — K219 Gastro-esophageal reflux disease without esophagitis: Secondary | ICD-10-CM | POA: Diagnosis not present

## 2015-04-01 DIAGNOSIS — R52 Pain, unspecified: Secondary | ICD-10-CM | POA: Diagnosis not present

## 2015-04-01 DIAGNOSIS — K5900 Constipation, unspecified: Secondary | ICD-10-CM | POA: Diagnosis not present

## 2015-04-01 DIAGNOSIS — S72002A Fracture of unspecified part of neck of left femur, initial encounter for closed fracture: Secondary | ICD-10-CM | POA: Diagnosis not present

## 2015-04-01 DIAGNOSIS — M6281 Muscle weakness (generalized): Secondary | ICD-10-CM | POA: Diagnosis not present

## 2015-04-01 LAB — GLUCOSE, CAPILLARY
Glucose-Capillary: 203 mg/dL — ABNORMAL HIGH (ref 65–99)
Glucose-Capillary: 236 mg/dL — ABNORMAL HIGH (ref 65–99)

## 2015-04-01 MED ORDER — GLUCERNA SHAKE PO LIQD: 237.0000 mL | Freq: Two times a day (BID) | ORAL | Status: DC

## 2015-04-01 MED ORDER — INSULIN GLARGINE 100 UNIT/ML ~~LOC~~ SOLN: 16.0000 [IU] | Freq: Every day | SUBCUTANEOUS | Status: DC

## 2015-04-01 MED ORDER — BISACODYL 10 MG RE SUPP: 10.0000 mg | Freq: Every day | RECTAL | Status: DC | PRN

## 2015-04-01 MED ORDER — METOPROLOL TARTRATE 25 MG PO TABS: 25.0000 mg | ORAL_TABLET | Freq: Two times a day (BID) | ORAL | Status: DC

## 2015-04-01 MED ORDER — METHOCARBAMOL 500 MG PO TABS: 500.0000 mg | ORAL_TABLET | Freq: Four times a day (QID) | ORAL | Status: DC | PRN

## 2015-04-01 MED ORDER — FERROUS SULFATE 325 (65 FE) MG PO TABS: 325.0000 mg | ORAL_TABLET | Freq: Every day | ORAL | Status: DC

## 2015-04-01 MED ORDER — AMIODARONE HCL 400 MG PO TABS: 400.0000 mg | ORAL_TABLET | Freq: Two times a day (BID) | ORAL | Status: DC

## 2015-04-01 MED ORDER — SENNA 8.6 MG PO TABS: 1.0000 | ORAL_TABLET | Freq: Two times a day (BID) | ORAL | Status: DC

## 2015-04-01 MED ORDER — RIVAROXABAN 20 MG PO TABS: 20.0000 mg | ORAL_TABLET | Freq: Every day | ORAL | Status: DC

## 2015-04-01 MED ORDER — INSULIN ASPART 100 UNIT/ML ~~LOC~~ SOLN: 0.0000 [IU] | Freq: Three times a day (TID) | SUBCUTANEOUS | Status: DC

## 2015-04-01 MED ORDER — LINAGLIPTIN 5 MG PO TABS
5.0000 mg | ORAL_TABLET | Freq: Every day | ORAL | Status: DC
Start: 2015-04-01 — End: 2015-04-20

## 2015-04-01 MED ORDER — HYDROCODONE-ACETAMINOPHEN 5-325 MG PO TABS: 1.0000 | ORAL_TABLET | Freq: Four times a day (QID) | ORAL | Status: DC | PRN

## 2015-04-01 NOTE — Progress Notes (Signed)
Inpatient Diabetes Program Recommendations  AACE/ADA: New Consensus Statement on Inpatient Glycemic Control (2015)  Target Ranges:  Prepandial:   less than 140 mg/dL      Peak postprandial:   less than 180 mg/dL (1-2 hours)      Critically ill patients:  140 - 180 mg/dL  Review of Glycemic Control   Results for [name], [name] (MRN [id number]) as of 04/01/2015 07:51  Ref. Range 03/30/2015 21:22 03/31/2015 07:42 03/31/2015 11:12 03/31/2015 16:05 03/31/2015 22:18  Glucose-Capillary Latest Ref Range: 65-99 mg/dL 198 (H) 212 (H) 217 (H) 95 280 (H)    Diabetes history: Type 2 diabetes Outpatient Diabetes medications: Januvia 50 mg daily Current orders for Inpatient glycemic control:  Lantus 12 units daily, Novolog sensitive tid with meals and HS, Tradjenta 5 mg daily  Inpatient Diabetes Program Recommendations:   Fasting blood sugar 203mg /dl-please consider increasing Lantus to 16 units daily.   Also consider increasing Novolog correction to moderate tid with meals. Blood sugar 95mg /dl yesterday was a result of Novolog at breakfast and lunch being given within 2.5 hours of each other but most post prandial blood sugars remain elevated despite the current Novolog order because it appears the patient needs a higher correction scale ordered.   Gentry Fitz, RN, BA, MHA, CDE Diabetes Coordinator Inpatient Diabetes Program  [phone] (Team Pager) [phone] ([place]) 04/01/2015 8:11 AM

## 2015-04-01 NOTE — Progress Notes (Signed)
Patient Name: [name] Date of Encounter: 04/01/2015     Principal Problem:   Femoral neck fracture, left, closed, initial encounter Active Problems:   Fall   S/P hip hemiarthroplasty   Atrial fibrillation with RVR ([place])   Left displaced femoral neck fracture ([facility])   COPD, severe ([place])   Diabetes mellitus, type 2 ([place])   Acute diastolic CHF (congestive heart failure) (H[facility])   GERD (gastroesophageal reflux disease)   Anxiety    SUBJECTIVE  Breathing much better.  In and out of AF overnight.  In sinus since ~ 7:30 this morning.  CURRENT MEDS . amiodarone  400 mg Oral BID  . famotidine  20 mg Oral BID  . feeding supplement (GLUCERNA SHAKE)  237 mL Oral BID WC  . ferrous sulfate  325 mg Oral Q breakfast  . insulin aspart  0-5 Units Subcutaneous QHS  . insulin aspart  0-9 Units Subcutaneous TID WC  . insulin glargine  12 Units Subcutaneous Daily  . linagliptin  5 mg Oral Daily  . metoprolol tartrate  25 mg Oral BID  . mometasone-formoterol  2 puff Inhalation BID  . moxifloxacin  1 drop Both Eyes QID  . nystatin  5 mL Mouth/Throat QID  . phenol-menthol  1 lozenge Buccal QID  . polyethylene glycol  17 g Oral Daily  . rivaroxaban  20 mg Oral Daily  . senna  1 tablet Oral BID  . tiotropium  18 mcg Inhalation Daily    OBJECTIVE  Filed Vitals:   03/31/15 1109 03/31/15 1434 03/31/15 1940 04/01/15 0329  BP: 137/59 149/60 150/54 131/70  Pulse: 75 85 99 124  Temp: 98.4 F (36.9 C)  97.8 F (36.6 C) 98.4 F (36.9 C)  TempSrc: Oral  Oral Oral  Resp: 18  20 20   Height:      Weight:      SpO2: 100% 100% 100% 100%    Intake/Output Summary (Last 24 hours) at 04/01/15 1021 Last data filed at 04/01/15 0445  Gross per 24 hour  Intake    240 ml  Output   1720 ml  Net  -1480 ml   Filed Weights   03/25/15 2244 03/26/15 0317 03/30/15 0859  Weight: 127 lb (57.607 kg) 130 lb 14.4 oz (59.376 kg) 143 lb (64.864 kg)    PHYSICAL EXAM  General: Pleasant,  NAD. Neuro: Alert and oriented X 3. Moves all extremities spontaneously. Psych: Normal affect. HEENT:  Normal  Neck: Supple without bruits or JVD. Lungs:  Resp regular and unlabored, diminished breath sounds in bilat bases. Heart: RRR - distant, no s3, s4, or murmurs. Abdomen: Soft, non-tender, non-distended, BS + x 4.  Extremities: No clubbing, cyanosis or edema. DP/PT/Radials 2+ and equal bilaterally.  Accessory Clinical Findings  CBC  Recent Labs  03/30/15 0544  WBC 6.9  HGB 8.8*  HCT 26.4*  MCV 93.7  PLT 742   Basic Metabolic Panel  Recent Labs  03/30/15 0544 03/31/15 0447  NA 141  140 141  K 4.0  4.0 4.3  CL 107  106 105  CO2 30  30 31   GLUCOSE 234*  235* 263*  BUN 10  11 18   CREATININE 0.71  0.64 0.67  CALCIUM 8.4*  8.3* 8.6*  MG 1.7  --    Liver Function Tests  Recent Labs  03/30/15 0544 03/31/15 0447  AST 26 30  ALT 19 24  ALKPHOS 56 60  BILITOT 0.3 0.3  PROT 5.3* 5.7*  ALBUMIN 2.4*  2.6*   Thyroid Function Tests  Recent Labs  03/31/15 0447  TSH 1.308    TELE  PAF, currently sinus.  Freq pvc's.  Radiology/Studies  Dg Chest 1 View  03/25/2015  CLINICAL DATA:  Left hip injury after a fall. History of COPD on oxygen. EXAM: CHEST 1 VIEW COMPARISON:  10/21/2013 FINDINGS: Diffuse emphysematous changes in the chest. Normal heart size and pulmonary vascularity. No focal airspace disease or consolidation in the lungs. No blunting of costophrenic angles. No pneumothorax. Mediastinal contours appear intact. Calcification of the aorta. IMPRESSION: Emphysematous changes in the lungs. No evidence of active pulmonary disease. Electronically Signed   By: Lucienne Capers M.D.   On: 03/25/2015 23:55   Dg Hip Unilat With Pelvis 1v Left  03/26/2015  CLINICAL DATA:  Postoperative left hip hemiarthroplasty. EXAM: DG HIP (WITH OR WITHOUT PELVIS) 1V*L* COMPARISON:  None. FINDINGS: Left hip hemi arthroplasty identified without subluxation or dislocation.  No acute bony abnormalities are present. No hardware complicating features are noted. Soft tissue postoperative changes are present. IMPRESSION: Left hip hemiarthroplasty without complicating features. Electronically Signed   By: Margarette Canada M.D.   On: 03/26/2015 18:29   Dg Hip Unilat With Pelvis 2-3 Views Left  03/25/2015  CLINICAL DATA:  79 year old female tripped and fell going to the bathroom with left hip pain. Initial encounter. EXAM: DG HIP (WITH OR WITHOUT PELVIS) 2-3V LEFT COMPARISON:  CT Abdomen and Pelvis 02/04/2014 FINDINGS: Comminuted left femoral neck fracture with varus impaction. The intertrochanteric segment appears to remain intact. The left femoral head remains articulating with the acetabulum. Small mildly displaced comminution fragments including that projecting adjacent to the lesser trochanter. Pelvis Intact. Proximal right femur appears intact. Stable small focus of dystrophic calcification in the medial proximal right thigh soft tissues. IMPRESSION: Comminuted left femoral neck fracture with varus impaction. Electronically Signed   By: Genevie Ann M.D.   On: 03/25/2015 23:50   2D Echocardiogram 10.17.2016  Study Conclusions  - Procedure narrative: Transthoracic echocardiography. Image   quality was poor. The study was technically difficult, as a   result of poor acoustic windows and poor sound wave transmission. - Left ventricle: The cavity size was normal. Wall thickness was   normal. Systolic function was normal. The estimated ejection   fraction was in the range of 55% to 60%. Doppler parameters are   consistent with abnormal left ventricular relaxation (grade 1   diastolic dysfunction).  ASSESSMENT AND PLAN  79 y.o. female with h/o paroxysmal tachy-palpitations, COPD with prn oxygen usage, DM2, SLE, history of rheumatic fever, MVP, and hypothyroidism who presented to [facility] on 10/14 after suffering a mechanical fall leading to a closed left femoral neck fracture s/p  hemiarthroplasty on 03/26/2015, subsequently developing new onset Afib with RVR on 10/16, followed by conversion to sinus rhythm on 10/17. She subsequently developed Afib with RVR overnight into 10/18.  1. New onset Afib with RVR: In an out of AF over the past 24 hrs.  Currently in sinus since ~ 7:30 this morning. Normal LA size on echo this admission. TSH, lytes, LFT's wnl. -Cont amio 400 bid for 1 week and then drop to 200 mg BID.  Cont bb.  Will need outpt PFT's. -F/U ECG to assess QTc. -CHA2DS2VASc = 5 Xarelto.  2. Acute diastolic CHF Nl LV fxn with grade 1 DD by echo this admission. Likely driven by IV fluids, atrial fibrillation with RVR, in the setting of COPD Received lasix 40 twice yesterday - minus 1L for  admission.  Not weighed since 10/18. Feeling much better and euvolemic on exam. -F/U wt this am. -Rhythm control will be important (see #1).  3. Femoral neck fracture: -Status post hemiarthroplasty -Per ortho  4. COPD -No exacerbation -Has been using O2 2lpm via [place] for the past month.  Signed, Murray Hodgkins NP

## 2015-04-01 NOTE — Consult Note (Signed)
   [facility] CM Inpatient Consult   04/01/2015  [name] Jan 12, 1936 [id number]  Follow up conversation with [name] regarding [facility] community care management services after discharge, in speaking with [name] by phone she wishes to discuss [facility] services with her husband further. [name] is agreeable to a phone call in a few weeks from [facility]. Of note, [facility] Care Management services does not replace or interfere with any services that are arranged by inpatient case management or social work. For additional questions or referrals please contact   Joylene Draft, RN, [place] Management/Hospital Liaison [phone]- Mobile [phone]- [place]

## 2015-04-01 NOTE — Care Management Important Message (Signed)
Important Message  Patient Details  Name: [name] MRN: [id number] Date of Birth: 1935-09-09   Medicare Important Message Given:  Yes-third notification given    Juliann Pulse A Allmond 04/01/2015, 10:09 AM

## 2015-04-01 NOTE — Progress Notes (Signed)
Physical Therapy Treatment Patient Details Name: [name] MRN: [id number] DOB: 02/10/1936 Today's Date: 04/01/2015    History of Present Illness Pt is a 79 yo femle who sustained a femoral neck fracture and now POD #2 L hemi arthroplasty.      PT Comments    Patient notes feeling a little better today; color looks better. Pt wishes up to try and use bedside commode versus bedpan currently on. Bedpan removed and pt assisted to bedside commode with Min A and cues for adherence to posterior hip precautions. Pt able to ambulate with short shuffling steps to chair requiring cues for improved step length and cues for stand to sit again for hip precautions. Pt wished supplies for facial and oral personal hygiene and set up so. Pt currently has discharge orders to skilled nursing facility today.   Follow Up Recommendations  SNF     Equipment Recommendations  Rolling walker with 5" wheels    Recommendations for Other Services       Precautions / Restrictions Precautions Precautions: Posterior Hip;Fall Restrictions Weight Bearing Restrictions: Yes LLE Weight Bearing: Partial weight bearing    Mobility  Bed Mobility Overal bed mobility: Needs Assistance Bed Mobility: Supine to Sit     Supine to sit: Modified independent (Device/Increase time)     General bed mobility comments: Requires use of rails and increased time  Transfers Overall transfer level: Needs assistance Equipment used: Rolling walker (2 wheeled) Transfers: Sit to/from Stand Sit to Stand: Min assist (cues for posterior hip precautions and PWB status)         General transfer comment: improved hand placement, but requires cues for adherence to posterior hip precautions and PWB status. does require cues for stand to sit for reaching back with R hand versus L for hip precautions  Ambulation/Gait Ambulation/Gait assistance: Min guard Ambulation Distance (Feet): 22 Feet (Initially several steps bed to  bedside commode with Min A) Assistive device: Rolling walker (2 wheeled) Gait Pattern/deviations: Step-to pattern;Decreased step length - right;Decreased step length - left;Decreased dorsiflexion - left;Shuffle;Narrow base of support     General Gait Details: short shuffling type steps initially without even step to on R; improved with cueing to step to. Patient fatigues quickly. Compliance with PWB on L with intiial cueing and no increased L hip pain noted.    Stairs            Wheelchair Mobility    Modified Rankin (Stroke Patients Only)       Balance Overall balance assessment: Needs assistance         Standing balance support: Bilateral upper extremity supported Standing balance-Leahy Scale: Fair                      Cognition Arousal/Alertness: Awake/alert Behavior During Therapy: WFL for tasks assessed/performed Overall Cognitive Status: Within Functional Limits for tasks assessed       Memory: Decreased recall of precautions (Names 2 of 3 )              Exercises      General Comments        Pertinent Vitals/Pain Pain Assessment: 0-10 Pain Score: 2  Pain Location: L hip Pain Descriptors / Indicators: Aching Pain Intervention(s): Limited activity within patient's tolerance;Monitored during session    Home Living Family/patient expects to be discharged to:: Private residence     Type of Home: House Home Access: Stairs to enter Entrance Stairs-Rails: None Home Layout: One level Home Equipment:  Bedside commode      Prior Function            PT Goals (current goals can now be found in the care plan section) Acute Rehab PT Goals Patient Stated Goal: Would like to go home but willing to go to rehab Progress towards PT goals: Progressing toward goals    Frequency  BID    PT Plan Current plan remains appropriate    Co-evaluation             End of Session Equipment Utilized During Treatment: Oxygen;Gait belt Activity  Tolerance: Patient limited by fatigue;Patient tolerated treatment well Patient left: in chair;with call bell/phone within reach;with chair alarm set;with family/visitor present (set up for facial/oral personal hygiene)     Time: 1610-9604 PT Time Calculation (min) (ACUTE ONLY): 34 min  Charges:  $Gait Training: 8-22 mins $Therapeutic Activity: 8-22 mins                    G Codes:      Charlaine Dalton 04/01/2015, 1:56 PM

## 2015-04-01 NOTE — Discharge Summary (Signed)
[name], is a 79 y.o. female  DOB [birth year]  MRN [id number].  Admission date:  03/25/2015  Admitting Physician  Lance Coon, MD  Discharge Date:  04/01/2015   Primary MD  Halina Maidens, MD  Recommendations for primary care physician for things to follow:   Follow up with the Dr. Earnestine Leys in 1 week Follow up with Dr. Revonda Standard  in 1 week   Admission Diagnosis  Fall [W19.XXXA] Left displaced femoral neck fracture, closed, initial encounter [facility]) [S72.002A]   Discharge Diagnosis  Fall [W19.XXXA] Left displaced femoral neck fracture, closed, initial encounter ([place]) [S72.002A]    Principal Problem:   Femoral neck fracture, left, closed, initial encounter Active Problems:   COPD, severe ([place])   Diabetes mellitus, type 2 ([place])   GERD (gastroesophageal reflux disease)   Anxiety   Fall   S/P hip hemiarthroplasty   Atrial fibrillation with RVR ([place])   Left displaced femoral neck fracture ([facility])   Acute diastolic CHF (congestive heart failure) ([place])      Past Medical History  Diagnosis Date  . DM (diabetes mellitus) ([place])   . Mitral valve prolapse   . COPD (chronic obstructive pulmonary disease) ([place])   . Palpitations   . Osteoarthritis   . Systemic lupus erythematosus ([place])   . Lumbar spondylolysis   . Raynaud's disease   . Anxiety   . IBS (irritable bowel syndrome)   . Uveitis, anterior     ????  . Rheumatic fever   . Ankylosing spondylitis ([place])     ? how diagnosed  . Insomnia, unspecified   . Chronic peptic ulcer, unspecified site, without mention of hemorrhage, perforation, or obstruction   . Paroxysmal tachycardia, unspecified ([place])   . History of kidney stones   . Chronic kidney infection   . Asthma     as child  . Pneumonia   . PONV (postoperative nausea and vomiting)   .  Hypothyroidism     mass, U/S 6/21 - nodules  . Motion sickness     car - back seat  . GERD (gastroesophageal reflux disease)   . Wears dentures     full upper, partial lower  . Wears contact lenses   . Oxygen decrease     2l prn  . Raynaud disease   . Headache   . Neuropathy ([place])   . Wheezing     Past Surgical History  Procedure Laterality Date  . Esophagogastroduodenoscopy  multiple  . Colonoscopy  multiple  . Total abdominal hysterectomy w/ bilateral salpingoophorectomy  1990  . Breast biopsy    . Biopsy thyroid    . Abdominal hysterectomy    . Esophagogastroduodenoscopy (egd) with propofol N/A 12/11/2014    Procedure: ESOPHAGOGASTRODUODENOSCOPY (EGD) WITH PROPOFOL;  Surgeon: [name], MD;  Location: [place];  Service: Endoscopy;  Laterality: N/A;  cytology brushing  . Cataract extraction w/phaco Left 03/23/2015    Procedure: CATARACT EXTRACTION PHACO AND INTRAOCULAR LENS PLACEMENT (IOC);  Surgeon: Birder Robson, MD;  Location: [facility];  Service: Ophthalmology;  Laterality: Left;  [place] 00:51   . Orif hip fracture Left 03/2015  . Hip arthroplasty Left 03/26/2015    Procedure: ARTHROPLASTY BIPOLAR HIP (HEMIARTHROPLASTY);  Surgeon: Earnestine Leys, MD;  Location: [facility];  Service: Orthopedics;  Laterality: Left;       History of present illness and  Hospital Course:     Kindly see H&P for history of present illness and admission details, please review complete Labs, Consult  reports and Test reports for all details in brief  HPI  from the history and physical done on the day of admission 79 year old female patient admitted for falls and noted to have left femoral neck fracture , noted for left hip fracture.  Hospital Course   #1 left hip fracture: Patient had left femoral neck fracture, closed . Patient was seen by Dr. Earnestine Leys and was taken to surgery on October 14 with a left hip .hemiarthroplasty. Up with physical therapy, recommended SNF placement  patient decided to go to [facility]. 2. Afib with RVR: Patient developed atrial fibrillation with RVR, transfer to CCU  Stepdown,she was on Cardizem drip [facility],HR stabilized with Amiodarone,400 mg po bid and metoprolol 25 mg po BID ,and we started on Xarelto for [facility].Echo showed  EF more than 55%.  #3 acute on chronic diastolic heart failure secondary to COPD, atrial fibrillation with RVR: Patient received  2 doses of IV Lasix which helped her with symptoms. 4,. It is uncontrolled was on Januvia alone at home. He is started her on Lantus, mealtime insulin, patient needs Lantus, mealtime insulin for better coverage on the glycemic control. Orders are placed. #4 chronic respiratory failure continue home oxygen 4-5 L and titrate as needed to keep saturations more than 92%. And continue her medications.  Follow-up with Dr. Earnestine Leys in about 2 weeks, follow up with Dr. Fletcher Anon in one week.  Discharge Condition:    Follow UP  Follow-up Information    Follow up with Halina Maidens, MD. Go on 04/15/2015.   Specialty:  Family Medicine   Why:  at 11:00am.    Contact information:   [address] [address] [phone]       Follow up with Park Breed, MD. Go on 04/08/2015.   Specialty:  Specialist   Why:  at 3:45pm.    Contact information:   [place] [address] [phone]       Follow up with Kathlyn Sacramento, MD. Go on 04/21/2015.   Specialty:  Cardiology   Why:  at 2:15pm. with Ignacia Bayley.    Contact information:   [place] [address] [phone]         Discharge Instructions  and  Discharge Medications        Medication List    TAKE these medications        albuterol (2.5 MG/3ML) 0.083% nebulizer solution  Commonly known as:  PROVENTIL  Take 2.5 mg by nebulization every 6 (six) hours as needed for wheezing or shortness of breath.     albuterol 108 (90  BASE) MCG/ACT inhaler  Commonly known as:  PROVENTIL HFA;VENTOLIN HFA  Inhale into the lungs 4 (four) times daily - after meals and at bedtime.     ALIGN PO  Take by mouth.     AMBULATORY NON FORMULARY MEDICATION  Oxygen (USES AS NEEDED)     amiodarone 400 MG tablet  Commonly known as:  PACERONE  Take 1 tablet (400 mg total) by mouth 2 (two) times daily.     ANORO ELLIPTA IN  Inhale into the lungs.     bisacodyl 10 MG suppository  Commonly known as:  DULCOLAX  Place 1 suppository (10 mg total) rectally daily as needed for moderate constipation.     feeding supplement (GLUCERNA SHAKE) Liqd  Take 237 mLs by mouth 2 (two) times daily with a meal.  ferrous sulfate 325 (65 FE) MG tablet  Take 1 tablet (325 mg total) by mouth daily with breakfast.     HYDROcodone-acetaminophen 5-325 MG tablet  Commonly known as:  NORCO/VICODIN  Take 1-2 tablets by mouth every 6 (six) hours as needed for moderate pain.     insulin aspart 100 UNIT/ML injection  Commonly known as:  novoLOG  Inject 0-9 Units into the skin 3 (three) times daily with meals.     insulin glargine 100 UNIT/ML injection  Commonly known as:  LANTUS  Inject 0.16 mLs (16 Units total) into the skin daily.     linagliptin 5 MG Tabs tablet  Commonly known as:  TRADJENTA  Take 1 tablet (5 mg total) by mouth daily.     LORazepam 1 MG tablet  Commonly known as:  ATIVAN  Take 0.5 tablets (0.5 mg total) by mouth 2 (two) times daily as needed.     methocarbamol 500 MG tablet  Commonly known as:  ROBAXIN  Take 1 tablet (500 mg total) by mouth every 6 (six) hours as needed for muscle spasms.     metoprolol tartrate 25 MG tablet  Commonly known as:  LOPRESSOR  Take 1 tablet (25 mg total) by mouth 2 (two) times daily.     moxifloxacin 0.5 % ophthalmic solution  Commonly known as:  VIGAMOX  1 drop QID.     ranitidine 150 MG tablet  Commonly known as:  ZANTAC  Take 150 mg by mouth 2 (two) times daily.     rivaroxaban  20 MG Tabs tablet  Commonly known as:  XARELTO  Take 1 tablet (20 mg total) by mouth daily.     senna 8.6 MG Tabs tablet  Commonly known as:  SENOKOT  Take 1 tablet (8.6 mg total) by mouth 2 (two) times daily.     sitaGLIPtin 50 MG tablet  Commonly known as:  JANUVIA  Take 1 tablet (50 mg total) by mouth daily.     sucralfate 1 G tablet  Commonly known as:  CARAFATE  Take 1 g by mouth 4 (four) times daily.     tiotropium 18 MCG inhalation capsule  Commonly known as:  SPIRIVA  Place 18 mcg into inhaler and inhale daily.          Diet and Activity recommendation: See Discharge Instructions above   Consults obtained - Ortho,Cardio   Major procedures and Radiology Reports - PLEASE review detailed and final reports for all details, in brief -      Dg Chest 1 View  03/25/2015  CLINICAL DATA:  Left hip injury after a fall. History of COPD on oxygen. EXAM: CHEST 1 VIEW COMPARISON:  10/21/2013 FINDINGS: Diffuse emphysematous changes in the chest. Normal heart size and pulmonary vascularity. No focal airspace disease or consolidation in the lungs. No blunting of costophrenic angles. No pneumothorax. Mediastinal contours appear intact. Calcification of the aorta. IMPRESSION: Emphysematous changes in the lungs. No evidence of active pulmonary disease. Electronically Signed   By: Lucienne Capers M.D.   On: 03/25/2015 23:55   Dg Hip Unilat With Pelvis 1v Left  03/26/2015  CLINICAL DATA:  Postoperative left hip hemiarthroplasty. EXAM: DG HIP (WITH OR WITHOUT PELVIS) 1V*L* COMPARISON:  None. FINDINGS: Left hip hemi arthroplasty identified without subluxation or dislocation. No acute bony abnormalities are present. No hardware complicating features are noted. Soft tissue postoperative changes are present. IMPRESSION: Left hip hemiarthroplasty without complicating features. Electronically Signed   By: Cleatis Polka.D.  On: 03/26/2015 18:29   Dg Hip Unilat With Pelvis 2-3 Views  Left  03/25/2015  CLINICAL DATA:  79 year old female tripped and fell going to the bathroom with left hip pain. Initial encounter. EXAM: DG HIP (WITH OR WITHOUT PELVIS) 2-3V LEFT COMPARISON:  CT Abdomen and Pelvis 02/04/2014 FINDINGS: Comminuted left femoral neck fracture with varus impaction. The intertrochanteric segment appears to remain intact. The left femoral head remains articulating with the acetabulum. Small mildly displaced comminution fragments including that projecting adjacent to the lesser trochanter. Pelvis Intact. Proximal right femur appears intact. Stable small focus of dystrophic calcification in the medial proximal right thigh soft tissues. IMPRESSION: Comminuted left femoral neck fracture with varus impaction. Electronically Signed   By: Genevie Ann M.D.   On: 03/25/2015 23:50    Micro Results    Recent Results (from the past 240 hour(s))  Surgical pcr screen     Status: None   Collection Time: 03/26/15  7:15 AM  Result Value Ref Range Status   MRSA, PCR NEGATIVE NEGATIVE Final   Staphylococcus aureus NEGATIVE NEGATIVE Final    Comment:        The Xpert SA Assay (FDA approved for NASAL specimens in patients over 13 years of age), is one component of a comprehensive surveillance program.  Test performance has been validated by [facility] for patients greater than or equal to 15 year old. It is not intended to diagnose infection nor to guide or monitor treatment.   MRSA PCR Screening     Status: None   Collection Time: 03/28/15 10:25 PM  Result Value Ref Range Status   MRSA by PCR NEGATIVE NEGATIVE Final    Comment:        The GeneXpert MRSA Assay (FDA approved for NASAL specimens only), is one component of a comprehensive MRSA colonization surveillance program. It is not intended to diagnose MRSA infection nor to guide or monitor treatment for MRSA infections.        Today   Subjective:   [name] today has no headache,no chest abdominal  pain,no new weakness tingling or numbness, feels much better ,ready to go to SNF>  Objective:   Blood pressure 129/74, pulse 107, temperature 97.8 F (36.6 C), temperature source Oral, resp. rate 18, height 5\' 4"  (1.626 m), weight 64.864 kg (143 lb), SpO2 93 %.   Intake/Output Summary (Last 24 hours) at 04/01/15 1216 Last data filed at 04/01/15 0830  Gross per 24 hour  Intake    240 ml  Output   1400 ml  Net  -1160 ml    Exam Awake Alert, Oriented x 3, No new F.N deficits, Normal affect [place].AT,PERRAL Supple Neck,No JVD, No cervical lymphadenopathy appriciated.  Symmetrical Chest wall movement, Good air movement bilaterally, CTAB RRR,No Gallops,Rubs or new Murmurs, No Parasternal Heave +ve B.Sounds, Abd Soft, Non tender, No organomegaly appriciated, No rebound -guarding or rigidity. No Cyanosis, Clubbing or edema, No new Rash or bruise  Data Review   CBC w Diff: Lab Results  Component Value Date   WBC 6.9 03/30/2015   WBC 10.2 10/22/2013   HGB 8.8* 03/30/2015   HGB 10.9* 10/22/2013   HCT 26.4* 03/30/2015   HCT 32.8* 10/22/2013   PLT 160 03/30/2015   PLT 188 10/22/2013   LYMPHOPCT 11 03/25/2015   LYMPHOPCT 9.9 07/17/2013   MONOPCT 7 03/25/2015   MONOPCT 5.5 07/17/2013   EOSPCT 1 03/25/2015   EOSPCT 0.0 07/17/2013   BASOPCT 0 03/25/2015   BASOPCT 0.0 07/17/2013  CMP: Lab Results  Component Value Date   NA 141 03/31/2015   NA 137 10/22/2013   K 4.3 03/31/2015   K 4.5 10/22/2013   CL 105 03/31/2015   CL 101 10/22/2013   CO2 31 03/31/2015   CO2 35* 10/22/2013   BUN 18 03/31/2015   BUN 23* 10/22/2013   CREATININE 0.67 03/31/2015   CREATININE 0.73 10/25/2013   PROT 5.7* 03/31/2015   PROT 7.0 07/16/2013   ALBUMIN 2.6* 03/31/2015   ALBUMIN 2.6* 07/16/2013   BILITOT 0.3 03/31/2015   BILITOT 0.3 07/16/2013   ALKPHOS 60 03/31/2015   ALKPHOS 84 07/16/2013   AST 30 03/31/2015   AST 26 07/16/2013   ALT 24 03/31/2015   ALT 21 07/16/2013  .   Total Time in  preparing paper work, data evaluation and todays exam - 21 minutes  [name] M.D on 04/01/2015 at 12:16 PM

## 2015-04-01 NOTE — Progress Notes (Signed)
AVS given to patient and husband. Packet has been prepared by social work. IV removed. Tele will be removed once EMS arrives. Patient is dressed and ready for pickup. Report has been called to [place] at [facility]. Education given on hip precautions and afib and new medications. No further questions.

## 2015-04-01 NOTE — Clinical Social Work Placement (Signed)
   CLINICAL SOCIAL WORK PLACEMENT  NOTE  Date:  04/01/2015  Patient Details  Name: [name] MRN: [id number] Date of Birth: 02-22-1936  Clinical Social Work is seeking post-discharge placement for this patient at the [place] level of care (*CSW will initial, date and re-position this form in  chart as items are completed):  Yes   Patient/family provided with [place] Work Department's list of facilities offering this level of care within the geographic area requested by the patient (or if unable, by the patient's family).  Yes   Patient/family informed of their freedom to choose among providers that offer the needed level of care, that participate in Medicare, Medicaid or managed care program needed by the patient, have an available bed and are willing to accept the patient.  Yes   Patient/family informed of 's ownership interest in [facility] and [facility], as well as of the fact that they are under no obligation to receive care at these facilities.  PASRR submitted to EDS on       PASRR number received on       Existing PASRR number confirmed on       FL2 transmitted to all facilities in geographic area requested by pt/family on 03/28/15     FL2 transmitted to all facilities within larger geographic area on       Patient informed that his/her managed care company has contracts with or will negotiate with certain facilities, including the following:        Yes   Patient/family informed of bed offers received.  Patient chooses bed at  [facility])     Physician recommends and patient chooses bed at      Patient to be transferred to  [facility]) on  .  Patient to be transferred to facility by  (EMS)     Patient family notified on   of transfer.  Name of family member notified:   (Husband Saralyn Pilar )     PHYSICIAN Please sign FL2, Please prepare prescriptions, Please prepare priority discharge summary,  including medications     Additional Comment:    _______________________________________________ Maurine Cane, LCSW 04/01/2015, 12:58 PM

## 2015-04-01 NOTE — Evaluation (Signed)
Occupational Therapy Evaluation Patient Details Name: [name] MRN: [id number] DOB: 1936-05-27 Today's Date: 04/01/2015    History of Present Illness This patient  is a 79 yo female who suffered a femoral neck fracture and received a L hemi arthroplasty repair.     Clinical Impression   This patient is a 79 year old female who came to [facility] after a fall suffering a L hip fracture.  She received a hemiarthroplasty repair. She lives with her husband in a one story home with one step to enter, She had been I with basic ADL and functional mobility.  She now requires significant  Assist and would benefit from Occupational Therapy for ADL/functional mobility training while staying within hip precautions  (posterior approach).     Follow Up Recommendations  SNF    Equipment Recommendations       Recommendations for Other Services       Precautions / Restrictions Precautions Precautions: Posterior Hip;Fall Restrictions LLE Weight Bearing: Partial weight bearing      Mobility Bed Mobility                  Transfers                      Balance                                            ADL                                         General ADL Comments: Patient had been independent with ADL. Today, practiced techniques for lower body dressing using hip kit to be able to stay within hip precautions (posterior approach). Used hand over hand assist assist to illustrate hip kit use to stay within hip precautions with verbal cues for technique.     Vision     Perception     Praxis      Pertinent Vitals/Pain       Hand Dominance     Extremity/Trunk Assessment Upper Extremity Assessment Upper Extremity Assessment:  (B UE 4-/5)   Lower Extremity Assessment Lower Extremity Assessment: Defer to PT evaluation       Communication Communication Communication: No difficulties    Cognition Arousal/Alertness: Awake/alert (Asleep upon entry but arouses easily and participates) Behavior During Therapy: WFL for tasks assessed/performed Overall Cognitive Status: Within Functional Limits for tasks assessed       Memory: Decreased recall of precautions (Names 2 of 3 )             General Comments       Exercises       Shoulder Instructions      Home Living Family/patient expects to be discharged to: skilled nursing facility      Type of Home: House Home Access: Stairs to enter Entrance Stairs-Number of Steps: 1 Entrance Stairs-Rails: None Home Layout: One level     Bathroom Shower/Tub: Walk-in shower (with bench and detachable shower head)   Bathroom Toilet: Standard     Home Equipment: Bedside commode          Prior Functioning/Environment               OT Diagnosis: Generalized weakness;Acute pain  OT Problem List:     OT Treatment/Interventions: Self-care/ADL training    OT Goals(Current goals can be found in the care plan section) Acute Rehab OT Goals Patient Stated Goal: Would like to go home but willing to go to rehab OT Goal Formulation: With patient Time For Goal Achievement: 04/15/15 Potential to Achieve Goals: Good  OT Frequency: Min 1X/week   Barriers to D/C:            Co-evaluation              End of Session Equipment Utilized During Treatment:  (hip kit)  Activity Tolerance:   Patient left: in bed;with family/visitor present (with physician)   Time: 1010-1031 OT Time Calculation (min): 21 min Charges:  OT General Charges $OT Visit: 1 Procedure OT Evaluation $Initial OT Evaluation Tier I: 1 Procedure OT Treatments $Self Care/Home Management : 8-22 mins G-Codes:    Myrene Galas, MS/OTR/L  04/01/2015, 10:42 AM

## 2015-04-01 NOTE — Progress Notes (Addendum)
CSW informed by Dr, Vianne Bulls that patient is medically ready to discharge, Patient and husband are in a agreement with plan. Husband Saralyn Pilar at bedside.  Call to SNF [facility] to confirm that patient's bed is ready. Provided patient's room number E-8 and number to call for report [phone] .  CSW updated FL2 and faxed with Rx's.  Discharge packet completed.   RN will call report and patient will discharge to [facility] via EMS.  Call made to [employer] with [employer] to inform her patient will discharge to [facility] today.   Casimer Lanius. Latanya Presser, MSW Clinical Social Work Department [phone] 1:03 PM

## 2015-04-06 DIAGNOSIS — E119 Type 2 diabetes mellitus without complications: Secondary | ICD-10-CM | POA: Diagnosis not present

## 2015-04-06 DIAGNOSIS — I5032 Chronic diastolic (congestive) heart failure: Secondary | ICD-10-CM | POA: Diagnosis not present

## 2015-04-06 DIAGNOSIS — K219 Gastro-esophageal reflux disease without esophagitis: Secondary | ICD-10-CM | POA: Diagnosis not present

## 2015-04-06 DIAGNOSIS — F419 Anxiety disorder, unspecified: Secondary | ICD-10-CM | POA: Diagnosis not present

## 2015-04-06 DIAGNOSIS — K5900 Constipation, unspecified: Secondary | ICD-10-CM | POA: Diagnosis not present

## 2015-04-06 DIAGNOSIS — S72002D Fracture of unspecified part of neck of left femur, subsequent encounter for closed fracture with routine healing: Secondary | ICD-10-CM | POA: Diagnosis not present

## 2015-04-06 DIAGNOSIS — I4891 Unspecified atrial fibrillation: Secondary | ICD-10-CM | POA: Diagnosis not present

## 2015-04-06 DIAGNOSIS — J449 Chronic obstructive pulmonary disease, unspecified: Secondary | ICD-10-CM | POA: Diagnosis not present

## 2015-04-06 NOTE — Patient Outreach (Signed)
[facility]) Care Management  04/06/2015  [name] 11-12-1935 [id number]   Notification from [place], RN to assign SW as patient is at [facility] short-term, assigned [employer], [place].  Thanks, Ronnell Freshwater. [place], [place] Assistant Phone: [phone] Fax: [phone]

## 2015-04-12 ENCOUNTER — Encounter: Admission: RE | Payer: Self-pay | Source: Ambulatory Visit

## 2015-04-12 ENCOUNTER — Other Ambulatory Visit: Payer: Self-pay | Admitting: *Deleted

## 2015-04-12 ENCOUNTER — Ambulatory Visit: Admission: RE | Admit: 2015-04-12 | Payer: Medicare Other | Source: Ambulatory Visit | Admitting: Ophthalmology

## 2015-04-12 SURGERY — PHACOEMULSIFICATION, CATARACT, WITH IOL INSERTION
Anesthesia: Choice | Laterality: Right

## 2015-04-12 NOTE — Patient Outreach (Addendum)
[facility]) Care Management  [facility] Social Work  04/12/2015  [name] 1936/06/01 [id number]  Subjective:  Patient is a 79 year old female  Objective:   Current Medications:  Current Outpatient Prescriptions  Medication Sig Dispense Refill  . albuterol (PROVENTIL HFA;VENTOLIN HFA) 108 (90 BASE) MCG/ACT inhaler Inhale into the lungs 4 (four) times daily - after meals and at bedtime.     [name] Kitchen albuterol (PROVENTIL) (2.5 MG/3ML) 0.083% nebulizer solution Take 2.5 mg by nebulization every 6 (six) hours as needed for wheezing or shortness of breath.    . AMBULATORY NON FORMULARY MEDICATION Oxygen (USES AS NEEDED)    . amiodarone (PACERONE) 400 MG tablet Take 1 tablet (400 mg total) by mouth 2 (two) times daily. 60 tablet 0  . bisacodyl (DULCOLAX) 10 MG suppository Place 1 suppository (10 mg total) rectally daily as needed for moderate constipation. 12 suppository 0  . feeding supplement, GLUCERNA SHAKE, (GLUCERNA SHAKE) LIQD Take 237 mLs by mouth 2 (two) times daily with a meal. 30 Can 0  . ferrous sulfate 325 (65 FE) MG tablet Take 1 tablet (325 mg total) by mouth daily with breakfast. 30 tablet 0  . HYDROcodone-acetaminophen (NORCO/VICODIN) 5-325 MG tablet Take 1-2 tablets by mouth every 6 (six) hours as needed for moderate pain. 30 tablet 0  . insulin aspart (NOVOLOG) 100 UNIT/ML injection Inject 0-9 Units into the skin 3 (three) times daily with meals. 10 mL 11  . insulin glargine (LANTUS) 100 UNIT/ML injection Inject 0.16 mLs (16 Units total) into the skin daily. 10 mL 11  . linagliptin (TRADJENTA) 5 MG TABS tablet Take 1 tablet (5 mg total) by mouth daily. 30 tablet 0  . LORazepam (ATIVAN) 1 MG tablet Take 0.5 tablets (0.5 mg total) by mouth 2 (two) times daily as needed. 90 tablet 0  . methocarbamol (ROBAXIN) 500 MG tablet Take 1 tablet (500 mg total) by mouth every 6 (six) hours as needed for muscle spasms. 30 tablet 0  . metoprolol tartrate (LOPRESSOR) 25 MG  tablet Take 1 tablet (25 mg total) by mouth 2 (two) times daily. 60 tablet 0  . moxifloxacin (VIGAMOX) 0.5 % ophthalmic solution 1 drop QID.    [name] Kitchen Probiotic Product (ALIGN PO) Take by mouth.    . ranitidine (ZANTAC) 150 MG tablet Take 150 mg by mouth 2 (two) times daily.    . rivaroxaban (XARELTO) 20 MG TABS tablet Take 1 tablet (20 mg total) by mouth daily. 30 tablet 0  . senna (SENOKOT) 8.6 MG TABS tablet Take 1 tablet (8.6 mg total) by mouth 2 (two) times daily. 120 each 0  . sitaGLIPtin (JANUVIA) 50 MG tablet Take 1 tablet (50 mg total) by mouth daily. 90 tablet 0  . sucralfate (CARAFATE) 1 G tablet Take 1 g by mouth 4 (four) times daily.    [name] Kitchen tiotropium (SPIRIVA) 18 MCG inhalation capsule Place 18 mcg into inhaler and inhale daily.    [name] Kitchen Umeclidinium-Vilanterol (ANORO ELLIPTA IN) Inhale into the lungs.     No current facility-administered medications for this visit.    Functional Status:  In your present state of health, do you have any difficulty performing the following activities: 03/26/2015 12/11/2014  Hearing? N N  Vision? N N  Difficulty concentrating or making decisions? N N  Walking or climbing stairs? N N  Dressing or bathing? N N  Doing errands, shopping? N -    Fall/Depression Screening:  PHQ 2/9 Scores 02/24/2015 02/24/2015  PHQ - 2  Score 2 2  PHQ- 9 Score 5 4    Assessment:  This Education officer, museum met with patient, patient's spouse and discharge planner Noreene Larsson at [facility] facility.  Patient originally from [place] and has lived in [place] for the last 2 years. 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   Patient reports having  3 children who are supportive. Patient's spouse supportive as well.  [facility] program re-visited.  It was reiterated that [facility] services would not interfere with home health services arranged.   Patient's daughter will be coming down from [facility] to assist with her care once she discharges.  Per patient and discharge planner, patient to discharge home on Friday.  Patient has chosen  [facility] for home health services post discharge.   Patient actively participating in physical therapy and states that she is feeling stronger daily  Phone number in room [phone]  Plan: This social worker will follow up with patient on 04/15/15.            Initial note to be routed to patient's primary care provider.    [facility] CM Care Plan Problem One        Most Recent Value   Care Plan Problem One  patient currently in skilled nursing facility   Role Documenting the Problem One  Clinical Social Worker   Care Plan for Problem One  Active   [facility] CM Short Term Goal #1 (0-30 days)  patient and LCSW to work with discharge planner to ensure safe and stable discharge home within the next 30 dyas   [facility] CM Short Term Goal #1 Start Date  04/12/15   Interventions for Short Term Goal #1  this Education officer, museum met with patient and discharge planner to ensure that patient's discharge needs are met   [facility] CM Short Term Goal #2 (0-30 days)  patient will participate in physical and occupational therapy for the next week   [facility] CM Short Term Goal #2 Start Date  04/12/15   Interventions for Short Term Goal #2  patient encouraged to activley participate in rehabilitation     [facility] Care Management [phone]

## 2015-04-13 DIAGNOSIS — J449 Chronic obstructive pulmonary disease, unspecified: Secondary | ICD-10-CM | POA: Diagnosis not present

## 2015-04-13 DIAGNOSIS — I5032 Chronic diastolic (congestive) heart failure: Secondary | ICD-10-CM | POA: Diagnosis not present

## 2015-04-13 DIAGNOSIS — S72002D Fracture of unspecified part of neck of left femur, subsequent encounter for closed fracture with routine healing: Secondary | ICD-10-CM | POA: Diagnosis not present

## 2015-04-15 ENCOUNTER — Inpatient Hospital Stay: Payer: Medicare Other | Admitting: Internal Medicine

## 2015-04-16 DIAGNOSIS — J449 Chronic obstructive pulmonary disease, unspecified: Secondary | ICD-10-CM | POA: Diagnosis not present

## 2015-04-16 DIAGNOSIS — E119 Type 2 diabetes mellitus without complications: Secondary | ICD-10-CM | POA: Diagnosis not present

## 2015-04-16 DIAGNOSIS — Z96642 Presence of left artificial hip joint: Secondary | ICD-10-CM | POA: Diagnosis not present

## 2015-04-16 DIAGNOSIS — Z4802 Encounter for removal of sutures: Secondary | ICD-10-CM | POA: Diagnosis not present

## 2015-04-16 DIAGNOSIS — Z9181 History of falling: Secondary | ICD-10-CM | POA: Diagnosis not present

## 2015-04-16 DIAGNOSIS — I4891 Unspecified atrial fibrillation: Secondary | ICD-10-CM | POA: Diagnosis not present

## 2015-04-16 DIAGNOSIS — S72002D Fracture of unspecified part of neck of left femur, subsequent encounter for closed fracture with routine healing: Secondary | ICD-10-CM | POA: Diagnosis not present

## 2015-04-19 ENCOUNTER — Other Ambulatory Visit: Payer: Self-pay | Admitting: *Deleted

## 2015-04-19 NOTE — Patient Outreach (Signed)
RNCM called pt as part of the Transition of Care calls. RNCM explained to pt [facility] services and pt stated she had so many services calling her she was overwhelmed. Pt answered some of the transition of care flowsheet but again stated she was overwhelmed. Pt stated she was planning to go to her primary care doctor tomorrow and had 2 other appts this week. RNCM able to provide dates and times and phone number to MD office pt scheduled to see. Pt stated she was going to get a lot of things straightened out when she saw her primary care doctor.She stated she had [facility] home health and they had already contacted her. Pt stated she was too overwhelmed to have another company trying to call or come see her. RNCM gave pt RNCM's number and made her aware that she could call if she had questions. Pt stated her daughter and husband have been assisting her with needs.   Plan: RNCM will make care management assistant aware pt refusing services.  Rutherford Limerick RN, BSN  [facility] Care Management [phone])

## 2015-04-20 ENCOUNTER — Ambulatory Visit (INDEPENDENT_AMBULATORY_CARE_PROVIDER_SITE_OTHER): Payer: Medicare Other | Admitting: Internal Medicine

## 2015-04-20 ENCOUNTER — Encounter: Payer: Self-pay | Admitting: Internal Medicine

## 2015-04-20 VITALS — BP 126/64 | HR 60 | Ht 64.0 in | Wt 126.4 lb

## 2015-04-20 DIAGNOSIS — I4891 Unspecified atrial fibrillation: Secondary | ICD-10-CM | POA: Diagnosis not present

## 2015-04-20 DIAGNOSIS — E118 Type 2 diabetes mellitus with unspecified complications: Secondary | ICD-10-CM | POA: Diagnosis not present

## 2015-04-20 DIAGNOSIS — J449 Chronic obstructive pulmonary disease, unspecified: Secondary | ICD-10-CM

## 2015-04-20 DIAGNOSIS — S72002A Fracture of unspecified part of neck of left femur, initial encounter for closed fracture: Secondary | ICD-10-CM

## 2015-04-20 DIAGNOSIS — B3781 Candidal esophagitis: Secondary | ICD-10-CM

## 2015-04-20 NOTE — Progress Notes (Signed)
Date:  04/20/2015   Name:  [name]   DOB:  1936-01-31   MRN:  [id number]   Chief Complaint: Hospitalization Follow-up and Diabetes  Hip fracture - patient fell and fractured her left hip at home. She went to the hospital and had an ORIF. Except for an episode of atrial fibrillation, she has done well. She went to rehabilitation in [place] for 2 weeks. While she was there her medications were changed around and she is now confused about what she should take.  Since she has been home she is using her walker regularly. She's had no additional falls. She is under the impression that home health and physical therapy is being established. Rapid atrial fibrillation - postop patient was complicated by rapid atrial fibrillation. She converted quickly to sinus rhythm after treatment with amiodarone. She was then placed on metoprolol and Xarelto. She has felt well since then and does not believe that she's had any further rapid heart beat. Diabetes - patient has been treated with Januvia 50 mg daily. Her last A1c was excellent. While she was in the hospital and rehabilitation she was switched to [place] as well as Lantus and NovoLog.  Since being home she has switched back to [place]. Esophageal candidiasis - Patient was eventually diagnosed with esophageal candidiasis by EGD back in the late summer. She was treated with fluconazole. She recently completed another course of nystatin swish and swallow. She is concerned about a possible pathology report from esophageal biopsy. I was unable to pull this up in the system.  Review of Systems  Constitutional: Negative for fever, chills and fatigue.  HENT: Negative for sore throat, trouble swallowing and voice change.   Respiratory: Negative for cough, chest tightness, shortness of breath and wheezing.   Cardiovascular: Positive for leg swelling. Negative for chest pain and palpitations.  Gastrointestinal: Negative for nausea, abdominal pain and  diarrhea.  Genitourinary: Negative for difficulty urinating.  Musculoskeletal: Positive for arthralgias and gait problem.  Skin: Negative for rash.  Neurological: Negative for tremors, syncope, weakness and numbness.  Hematological: Negative for adenopathy.  Psychiatric/Behavioral: Negative for confusion and dysphoric mood. The patient is nervous/anxious.     Patient Active Problem List   Diagnosis Date Noted  . Left displaced femoral neck fracture ([place])   . Acute diastolic CHF (congestive heart failure) ([place])   . SOB (shortness of breath)   . Fall   . S/P hip hemiarthroplasty   . Atrial fibrillation with RVR ([place])   . GERD (gastroesophageal reflux disease) 03/26/2015  . Anxiety 03/26/2015  . Femoral neck fracture, left, closed, initial encounter 03/26/2015  . Gastroduodenal ulcer 01/05/2015  . Difficulty swallowing solids   . Esophageal candidiasis ([place])   . Acquired hypothyroidism 08/18/2014  . Diabetes mellitus, type 2 ([place]) 08/18/2014  . Big thyroid 04/06/2014  . Disseminated lupus erythematosus ([place]) 04/03/2014  . Palpitations 04/25/2013  . On home oxygen therapy - prn 04/19/2013  . COPD, severe ([place]) 04/19/2013    Prior to Admission medications   Medication Sig Start Date End Date Taking? Authorizing Provider  albuterol (PROVENTIL HFA;VENTOLIN HFA) 108 (90 BASE) MCG/ACT inhaler Inhale into the lungs 4 (four) times daily - after meals and at bedtime.    Yes Historical Provider, MD  amiodarone (PACERONE) 400 MG tablet Take 1 tablet (400 mg total) by mouth 2 (two) times daily. 04/01/15  Yes Epifanio Lesches, MD  DUREZOL 0.05 % EMUL USE ONE DROP IN OPERATIVE EYE TWICE A DAY. START AFTER SURGERY 03/20/15  Yes Historical Provider, MD  HYDROcodone-acetaminophen (NORCO/VICODIN) 5-325 MG tablet Take 1-2 tablets by mouth every 6 (six) hours as needed for moderate pain. 04/01/15  Yes Epifanio Lesches, MD  ILEVRO 0.3 % ophthalmic suspension USE 1 DROP IN OPERATIVE EYE ONCE A DAY  AND ONCE ON MORNING OF SURGERY. BEGINNING 2 DAYS PRIOR 03/20/15  Yes Historical Provider, MD  JANUVIA 100 MG tablet Take 50 mg by mouth daily. 01/28/15  Yes Historical Provider, MD  LORazepam (ATIVAN) 1 MG tablet Take 0.5 tablets (0.5 mg total) by mouth 2 (two) times daily as needed. 02/22/15  Yes Glean Hess, MD  methocarbamol (ROBAXIN) 500 MG tablet Take 1 tablet (500 mg total) by mouth every 6 (six) hours as needed for muscle spasms. 04/01/15  Yes Epifanio Lesches, MD  metoprolol tartrate (LOPRESSOR) 25 MG tablet Take 1 tablet (25 mg total) by mouth 2 (two) times daily. 04/01/15  Yes Epifanio Lesches, MD  moxifloxacin (VIGAMOX) 0.5 % ophthalmic solution 1 drop QID.   Yes Historical Provider, MD  nystatin (MYCOSTATIN) 100000 UNIT/ML suspension TAKE 5 MLS (500,000 UNITS TOTAL) BY MOUTH 4 (FOUR) TIMES DAILY. 02/24/15  Yes Historical Provider, MD  Probiotic Product (ALIGN PO) Take by mouth.   Yes Historical Provider, MD  ranitidine (ZANTAC) 150 MG tablet Take 150 mg by mouth 2 (two) times daily.   Yes Historical Provider, MD  rivaroxaban (XARELTO) 20 MG TABS tablet Take 1 tablet (20 mg total) by mouth daily. 04/01/15  Yes Epifanio Lesches, MD  senna (SENOKOT) 8.6 MG TABS tablet Take 1 tablet (8.6 mg total) by mouth 2 (two) times daily. 04/01/15  Yes Epifanio Lesches, MD  sucralfate (CARAFATE) 1 G tablet Take 1 g by mouth 4 (four) times daily.   Yes Historical Provider, MD  tiotropium (SPIRIVA) 18 MCG inhalation capsule Place 18 mcg into inhaler and inhale daily.   Yes Historical Provider, MD  fluconazole (DIFLUCAN) 200 MG tablet TAKE 1 TABLET (200 MG TOTAL) BY MOUTH ONCE DAILY. 02/02/15   Historical Provider, MD  insulin aspart (NOVOLOG) 100 UNIT/ML injection Inject 0-9 Units into the skin 3 (three) times daily with meals. Patient not taking: Reported on 04/20/2015 04/01/15   Epifanio Lesches, MD  insulin glargine (LANTUS) 100 UNIT/ML injection Inject 0.16 mLs (16 Units total) into the  skin daily. Patient not taking: Reported on 04/19/2015 04/01/15   Epifanio Lesches, MD    Allergies  Allergen Reactions  . Codeine Nausea And Vomiting  . Penicillins Hives  . Sulfa Antibiotics   . Doxycycline Rash  . Erythromycin Rash  . Latex Rash    Past Surgical History  Procedure Laterality Date  . Esophagogastroduodenoscopy  multiple  . Colonoscopy  multiple  . Total abdominal hysterectomy w/ bilateral salpingoophorectomy  1990  . Breast biopsy    . Biopsy thyroid    . Abdominal hysterectomy    . Esophagogastroduodenoscopy (egd) with propofol N/A 12/11/2014    Procedure: ESOPHAGOGASTRODUODENOSCOPY (EGD) WITH PROPOFOL;  Surgeon: [name], MD;  Location: [facility];  Service: Endoscopy;  Laterality: N/A;  cytology brushing  . Cataract extraction w/phaco Left 03/23/2015    Procedure: CATARACT EXTRACTION PHACO AND INTRAOCULAR LENS PLACEMENT (IOC);  Surgeon: Birder Robson, MD;  Location: [facility] ORS;  Service: Ophthalmology;  Laterality: Left;  [place] 00:51   . Orif hip fracture Left 03/2015  . Hip arthroplasty Left 03/26/2015    Procedure: ARTHROPLASTY BIPOLAR HIP (HEMIARTHROPLASTY);  Surgeon: Earnestine Leys, MD;  Location: [facility] OR[facility];  Service: Orthopedics;  Laterality: Left;  Social History  Substance Use Topics  . Smoking status: Former Smoker -- 0.25 packs/day for 40 years    Types: Cigarettes    Quit date: 10/11/2011  . Smokeless tobacco: Never Used     Comment: Quit 4 months ago   . Alcohol Use: No    Medication list has been reviewed and updated.   Physical Exam  Constitutional: She is oriented to person, place, and time. She appears well-developed and well-nourished. No distress.  HENT:  Head: Normocephalic and atraumatic.  Eyes: Conjunctivae are normal. Right eye exhibits no discharge. Left eye exhibits no discharge. No scleral icterus.  Neck: Normal range of motion. Neck supple. No thyromegaly present.  Cardiovascular: Normal rate, regular rhythm  and normal heart sounds.   Pulmonary/Chest: Effort normal. No respiratory distress. She has decreased breath sounds. She has no wheezes.  Musculoskeletal: Normal range of motion. She exhibits edema.  Well-healed left hip surgery scar  Lymphadenopathy:    She has no cervical adenopathy.  Neurological: She is alert and oriented to person, place, and time.  Skin: Skin is warm and dry. No rash noted.  Psychiatric: She has a normal mood and affect. Her speech is normal and behavior is normal. Thought content normal.    BP 126/64 mmHg  Pulse 60  Ht 5\' 4"  (1.626 m)  Wt 126 lb 6.4 oz (57.335 kg)  BMI 21.69 kg/m2  Assessment and Plan: 1. Atrial fibrillation with RVR ([place]) Now in sinus rhythm She was instructed to continue amiodarone, metoprolol, and Xarelto Follow up with cardiology as planned  2. COPD, severe ([place]) Stop Advair; resume Dulera Continue Spiriva and albuterol  3. Esophageal candidiasis ([place]) Follow-up with Dr. Allen Norris if needed and to discuss biopsy report  4. Femoral neck fracture, left, closed, initial encounter Doing well status post surgery Patient instructed to begin iron supplementation for post op anemia home health and physical therapy I encouraged the patient also follow up with [facility]  5. Type 2 diabetes mellitus with complication, without long-term current use of insulin ([place]) Continue Januvia   Halina Maidens, MD [facility]  04/20/2015

## 2015-04-20 NOTE — Patient Instructions (Addendum)
FOR ATRIAL FIBRILLATION:   Continue Amiodarone, Metoprolol and Xarelto until instructed to do otherwise by Dr. Rockey Situ - cardiology.  You may stop Advair and resume Dulera  Continue Januvia.   Pick up the Iron supplement and take it as directed

## 2015-04-21 ENCOUNTER — Encounter: Payer: Medicare Other | Admitting: Nurse Practitioner

## 2015-04-21 NOTE — Patient Outreach (Signed)
[facility]) Care Management  04/21/2015  [place] Zarazua 1935-11-15 [id number]   Notification from [place], RN to close case as patient does not want our services calling her she feels overwhelmed with calls from different services.  Thanks, Ronnell Freshwater. [place], [place] Assistant Phone: [phone] Fax: [phone]

## 2015-04-22 ENCOUNTER — Encounter: Payer: Self-pay | Admitting: Nurse Practitioner

## 2015-04-22 ENCOUNTER — Ambulatory Visit (INDEPENDENT_AMBULATORY_CARE_PROVIDER_SITE_OTHER): Payer: Medicare Other | Admitting: Nurse Practitioner

## 2015-04-22 VITALS — BP 138/62 | HR 57 | Ht 64.0 in | Wt 128.5 lb

## 2015-04-22 DIAGNOSIS — I480 Paroxysmal atrial fibrillation: Secondary | ICD-10-CM | POA: Diagnosis not present

## 2015-04-22 DIAGNOSIS — I5032 Chronic diastolic (congestive) heart failure: Secondary | ICD-10-CM

## 2015-04-22 DIAGNOSIS — I4891 Unspecified atrial fibrillation: Secondary | ICD-10-CM | POA: Diagnosis not present

## 2015-04-22 MED ORDER — AMIODARONE HCL 200 MG PO TABS: 200.0000 mg | ORAL_TABLET | Freq: Every day | ORAL | Status: DC

## 2015-04-22 NOTE — Progress Notes (Signed)
Patient Name: [name] Date of Encounter: 04/22/2015  Primary Care Provider:  Halina Maidens, MD Primary Cardiologist:  Jerilynn Mages. Fletcher Anon, MD   Chief Complaint  79 y/o F s/p recent L hip hemiarthroplasty complicated by PAF, who presents for f/u.  Past Medical History   Past Medical History  Diagnosis Date  . DM (diabetes mellitus) ([facility])   . Mitral valve prolapse     a. Not noted on 03/2015 Echo.  Marland Kitchen COPD (chronic obstructive pulmonary disease) ([place])   . Palpitations   . Osteoarthritis   . Systemic lupus erythematosus ([place])   . Lumbar spondylolysis   . Raynaud's disease   . Anxiety   . IBS (irritable bowel syndrome)   . Uveitis, anterior     ????  . Rheumatic fever   . Ankylosing spondylitis ([place])     ? how diagnosed  . Insomnia, unspecified   . Chronic peptic ulcer, unspecified site, without mention of hemorrhage, perforation, or obstruction   . History of kidney stones   . Chronic kidney infection   . Asthma     as child  . Hypothyroidism     mass, U/S 6/21 - nodules  . Motion sickness     car - back seat  . GERD (gastroesophageal reflux disease)   . Wears dentures     full upper, partial lower  . Wears contact lenses   . COPD (chronic obstructive pulmonary disease) ([place])     a.Uses 2l prn - followed by Dr. Raul Del.  . Raynaud disease   . Headache   . Neuropathy ([place])   . Wheezing   . Chronic diastolic CHF (congestive heart failure) ([place])     a. 03/2015 Echo: EF 55-60%, Gr 1 DD.  Marland Kitchen PAF (paroxysmal atrial fibrillation) ([place])     a. 03/2015 in setting of hip Fx->Amio/Xarelo;  b. CHA2DS2VASc= 4.  . Left displaced femoral neck fracture ([place])     a. 03/2015 s/p L hemiarthroplasty.   Past Surgical History  Procedure Laterality Date  . Esophagogastroduodenoscopy  multiple  . Colonoscopy  multiple  . Total abdominal hysterectomy w/ bilateral salpingoophorectomy  1990  . Breast biopsy    . Biopsy thyroid    . Abdominal hysterectomy    .  Esophagogastroduodenoscopy (egd) with propofol N/A 12/11/2014    Procedure: ESOPHAGOGASTRODUODENOSCOPY (EGD) WITH PROPOFOL;  Surgeon: [name], MD;  Location: [facility];  Service: Endoscopy;  Laterality: N/A;  cytology brushing  . Cataract extraction w/phaco Left 03/23/2015    Procedure: CATARACT EXTRACTION PHACO AND INTRAOCULAR LENS PLACEMENT (IOC);  Surgeon: Birder Robson, MD;  Location: [facility];  Service: Ophthalmology;  Laterality: Left;  [place] 00:51   . Orif hip fracture Left 03/2015  . Hip arthroplasty Left 03/26/2015    Procedure: ARTHROPLASTY BIPOLAR HIP (HEMIARTHROPLASTY);  Surgeon: Earnestine Leys, MD;  Location: [facility];  Service: Orthopedics;  Laterality: Left;    Allergies  Allergies  Allergen Reactions  . Codeine Nausea And Vomiting  . Penicillins Hives  . Sulfa Antibiotics   . Doxycycline Rash  . Erythromycin Rash  . Latex Rash    HPI  79 y/o female with the above complex problem list.  She was recently hospitalized related to a left femoral neck fracture and underwent left hemiarthroplasty.  Post-op course was complicated by AF RVR and also acute diast CHF in the setting of IVF.  Echo showed nl LV fxn.  She was diuresed and placed on amio and xarelto with control of AF.  She was subsequently d/c'd to rehab where she did well and has since been d/c'd home.  Since d/c, she has done reasonably well.  She thinks that she may have had some palpitations on one occasion but when she checked her HR on her pulse ox @ home, it was wnl, as was the rhythm.  She has noted mild, dependent, lower ext edema that is not usually present in the AM but comes on as the day progresses.  She does sit for long portions of the day and has not been keeping her legs elevated.  She denies chest pain, pnd, orthopnea, n, v, dizziness, syncope, weight gain, or early satiety.   Home Medications  Prior to Admission medications   Medication Sig Start Date End Date Taking? Authorizing Provider    albuterol (PROVENTIL HFA;VENTOLIN HFA) 108 (90 BASE) MCG/ACT inhaler Inhale into the lungs 4 (four) times daily - after meals and at bedtime.    Yes Historical Provider, MD  amiodarone (PACERONE) 200 MG tablet Take 1 tablet (200 mg total) by mouth daily. Take 200mg  twice per day for 2 weeks then 200mg  once per day 04/22/15  Yes Rogelia Mire, NP  DUREZOL 0.05 % EMUL USE ONE DROP IN OPERATIVE EYE TWICE A DAY. START AFTER SURGERY 03/20/15  Yes Historical Provider, MD  HYDROcodone-acetaminophen (NORCO/VICODIN) 5-325 MG tablet Take 1-2 tablets by mouth every 6 (six) hours as needed for moderate pain. 04/01/15  Yes Epifanio Lesches, MD  ILEVRO 0.3 % ophthalmic suspension USE 1 DROP IN OPERATIVE EYE ONCE A DAY AND ONCE ON MORNING OF SURGERY. BEGINNING 2 DAYS PRIOR 03/20/15  Yes Historical Provider, MD  JANUVIA 100 MG tablet Take 50 mg by mouth daily. 01/28/15  Yes Historical Provider, MD  LORazepam (ATIVAN) 1 MG tablet Take 0.5 tablets (0.5 mg total) by mouth 2 (two) times daily as needed. 02/22/15  Yes Glean Hess, MD  methocarbamol (ROBAXIN) 500 MG tablet Take 1 tablet (500 mg total) by mouth every 6 (six) hours as needed for muscle spasms. 04/01/15  Yes Epifanio Lesches, MD  metoprolol tartrate (LOPRESSOR) 25 MG tablet Take 1 tablet (25 mg total) by mouth 2 (two) times daily. 04/01/15  Yes Epifanio Lesches, MD  moxifloxacin (VIGAMOX) 0.5 % ophthalmic solution 1 drop QID.   Yes Historical Provider, MD  Probiotic Product (ALIGN PO) Take by mouth.   Yes Historical Provider, MD  ranitidine (ZANTAC) 150 MG tablet Take 150 mg by mouth 2 (two) times daily.   Yes Historical Provider, MD  rivaroxaban (XARELTO) 20 MG TABS tablet Take 1 tablet (20 mg total) by mouth daily. 04/01/15  Yes Epifanio Lesches, MD  senna (SENOKOT) 8.6 MG TABS tablet Take 1 tablet (8.6 mg total) by mouth 2 (two) times daily. 04/01/15  Yes Epifanio Lesches, MD  sucralfate (CARAFATE) 1 G tablet Take 1 g by mouth 4  (four) times daily.   Yes Historical Provider, MD  tiotropium (SPIRIVA) 18 MCG inhalation capsule Place 18 mcg into inhaler and inhale daily.   Yes Historical Provider, MD    Review of Systems  She has chronic DOE and uses O2 prn.  She has not had any significant or prolonged palpitations since d/c from the hospital.  She has had dependent LEE but denies pnd, orthopnea, n, v, dizziness, syncope, or early satiety.  She has noted some degree of urinary frequency, burning, and difficulty starting a stream.  No fevers/chills/pelvic or back pain.  All other systems reviewed and are otherwise negative except as  noted above.  Physical Exam  VS:  BP 138/62 mmHg  Pulse 57  Ht 5\' 4"  (1.626 m)  Wt 128 lb 8 oz (58.287 kg)  BMI 22.05 kg/m2 , BMI Body mass index is 22.05 kg/(m^2). GEN: Well nourished, well developed, in no acute distress. HEENT: normal. Neck: Supple, no JVD, carotid bruits, or masses. Cardiac: RRR, no murmurs, rubs, or gallops. No clubbing, cyanosis.  Trace RLE edema, 1+ LLE edema.  Radials/DP/PT 2+ and equal bilaterally.  Respiratory:  Respirations regular and unlabored, clear to auscultation bilaterally. GI: Soft, nontender, nondistended, BS + x 4. MS: no deformity or atrophy. Skin: warm and dry, no rash. Neuro:  Strength and sensation are intact. Psych: Normal affect.  Accessory Clinical Findings  ECG - Sinus brady, 57, no acute st/t changes.  Assessment & Plan  1.  PAF:  Pt developed AF in setting of recent L hip fx and surgery.  Prior to that, she did have a h/o palpitations but we were never able to capture a rhythm abnormality.  Since d/c, she has been on amio and xarelto (CHA2DS2VASc = 4) and has tolerated these well.  She has not had any significant or prolonged palpitations.  I have asked her to reduce her amio dose to 200 mg BID for the next two weeks and then she is to reduce it further to 200 mg daily.  I will f/u a CBC and CMET today and have asked her to f/u with her  pulmonologist for PFT's since she is now on amio.  TSH was nl in October.  Pt and husband verbalized understanding. We did have a long discussion about management of AFib, the role of antiarrhythmics, and also anticoagulation.  2.  Chronic Diastolic CHF:  She has been having some dependent LEE associated with either sitting or being on her feet.  She has chronic DOE.  She has mild L>R edema on exam today but otw appears to be euvolemic.  HR/BP stable.  I've encouraged her to closely watch the salt in the foods that she eats. She admits to going out to eat a few times/wk and also eating processed foods @ home.  I've also encouraged her to keep her legs elevated when she is sitting and if necessary, to wear an above the knee support hose/stocking.  3.  COPD:  No active wheezing today.  She will f/u with Dr. Raul Del re: PFT's.  4.  Dysuria:  She reports a six month h/o urinary frequency, occasional burning, and difficulty starting/maintaining a stream.  She was treated for UTI earlier this year and has also seen urology with cystoscopy performed.  I offered to check a UA but she prefers to f/u with her PCP.  5.  Dispo:  F/u with Dr. Fletcher Anon in 3 mos.   Murray Hodgkins, NP 04/22/2015, 2:25 PM

## 2015-04-22 NOTE — Patient Instructions (Signed)
Medication Instructions:  Your physician has recommended you make the following change in your medication:  TAKE amiodarone 200mg  twice a day for 2 weeks then change amiodarone dose to 200mg  once per day   Labwork: CMET and CBC in 3 months  Testing/Procedures: none  Follow-Up: Your physician wants you to follow-up in: 6 months with Dr. Zannie Cove will receive a reminder letter in the mail two months in advance. If you don't receive a letter, please call our office to schedule the follow-up appointment.  Your physician recommends that you schedule a follow-up appointment with Dr. Raul Del for PFTs   Any Other Special Instructions Will Be Listed Below (If Applicable).     If you need a refill on your cardiac medications before your next appointment, please call your pharmacy.

## 2015-04-23 DIAGNOSIS — I4891 Unspecified atrial fibrillation: Secondary | ICD-10-CM | POA: Diagnosis not present

## 2015-04-23 DIAGNOSIS — J449 Chronic obstructive pulmonary disease, unspecified: Secondary | ICD-10-CM | POA: Diagnosis not present

## 2015-04-23 DIAGNOSIS — S72002D Fracture of unspecified part of neck of left femur, subsequent encounter for closed fracture with routine healing: Secondary | ICD-10-CM | POA: Diagnosis not present

## 2015-04-23 DIAGNOSIS — E119 Type 2 diabetes mellitus without complications: Secondary | ICD-10-CM | POA: Diagnosis not present

## 2015-04-23 DIAGNOSIS — Z96642 Presence of left artificial hip joint: Secondary | ICD-10-CM | POA: Diagnosis not present

## 2015-04-23 DIAGNOSIS — Z9181 History of falling: Secondary | ICD-10-CM | POA: Diagnosis not present

## 2015-04-26 DIAGNOSIS — I4891 Unspecified atrial fibrillation: Secondary | ICD-10-CM | POA: Diagnosis not present

## 2015-04-26 DIAGNOSIS — Z9181 History of falling: Secondary | ICD-10-CM | POA: Diagnosis not present

## 2015-04-26 DIAGNOSIS — S72002D Fracture of unspecified part of neck of left femur, subsequent encounter for closed fracture with routine healing: Secondary | ICD-10-CM | POA: Diagnosis not present

## 2015-04-26 DIAGNOSIS — Z96642 Presence of left artificial hip joint: Secondary | ICD-10-CM | POA: Diagnosis not present

## 2015-04-26 DIAGNOSIS — J449 Chronic obstructive pulmonary disease, unspecified: Secondary | ICD-10-CM | POA: Diagnosis not present

## 2015-04-26 DIAGNOSIS — E119 Type 2 diabetes mellitus without complications: Secondary | ICD-10-CM | POA: Diagnosis not present

## 2015-04-27 DIAGNOSIS — E119 Type 2 diabetes mellitus without complications: Secondary | ICD-10-CM | POA: Diagnosis not present

## 2015-04-27 DIAGNOSIS — J449 Chronic obstructive pulmonary disease, unspecified: Secondary | ICD-10-CM | POA: Diagnosis not present

## 2015-04-27 DIAGNOSIS — I4891 Unspecified atrial fibrillation: Secondary | ICD-10-CM | POA: Diagnosis not present

## 2015-04-27 DIAGNOSIS — Z9181 History of falling: Secondary | ICD-10-CM | POA: Diagnosis not present

## 2015-04-27 DIAGNOSIS — S72002D Fracture of unspecified part of neck of left femur, subsequent encounter for closed fracture with routine healing: Secondary | ICD-10-CM | POA: Diagnosis not present

## 2015-04-27 DIAGNOSIS — Z96642 Presence of left artificial hip joint: Secondary | ICD-10-CM | POA: Diagnosis not present

## 2015-04-29 ENCOUNTER — Telehealth: Payer: Self-pay | Admitting: Obstetrics and Gynecology

## 2015-04-29 NOTE — Telephone Encounter (Signed)
This patient has a 1 year follow up with you on 05-18-15 and her follow up appt said for her to have a KUB prior to this appt but she broke her hip and is not able to get one at this time. Just wanted you to be aware.  Thanks, Sharyn Lull

## 2015-05-13 DIAGNOSIS — M25552 Pain in left hip: Secondary | ICD-10-CM | POA: Diagnosis not present

## 2015-05-13 DIAGNOSIS — R2689 Other abnormalities of gait and mobility: Secondary | ICD-10-CM | POA: Diagnosis not present

## 2015-05-14 DIAGNOSIS — R2689 Other abnormalities of gait and mobility: Secondary | ICD-10-CM | POA: Diagnosis not present

## 2015-05-14 DIAGNOSIS — M25552 Pain in left hip: Secondary | ICD-10-CM | POA: Diagnosis not present

## 2015-05-17 DIAGNOSIS — M25552 Pain in left hip: Secondary | ICD-10-CM | POA: Diagnosis not present

## 2015-05-17 DIAGNOSIS — R2689 Other abnormalities of gait and mobility: Secondary | ICD-10-CM | POA: Diagnosis not present

## 2015-05-18 ENCOUNTER — Ambulatory Visit: Payer: Medicare Other | Admitting: Obstetrics and Gynecology

## 2015-05-19 ENCOUNTER — Ambulatory Visit (INDEPENDENT_AMBULATORY_CARE_PROVIDER_SITE_OTHER): Payer: Medicare Other | Admitting: Obstetrics and Gynecology

## 2015-05-19 ENCOUNTER — Encounter: Payer: Self-pay | Admitting: Obstetrics and Gynecology

## 2015-05-19 ENCOUNTER — Telehealth: Payer: Self-pay | Admitting: Obstetrics and Gynecology

## 2015-05-19 ENCOUNTER — Ambulatory Visit: Payer: Medicare Other | Admitting: Obstetrics and Gynecology

## 2015-05-19 VITALS — BP 149/63 | HR 59 | Resp 16 | Ht 64.0 in | Wt 122.2 lb

## 2015-05-19 DIAGNOSIS — R319 Hematuria, unspecified: Secondary | ICD-10-CM

## 2015-05-19 DIAGNOSIS — H2511 Age-related nuclear cataract, right eye: Secondary | ICD-10-CM | POA: Diagnosis not present

## 2015-05-19 LAB — URINALYSIS, COMPLETE
BILIRUBIN UA: NEGATIVE
KETONES UA: NEGATIVE
LEUKOCYTES UA: NEGATIVE
Nitrite, UA: NEGATIVE
Urobilinogen, Ur: 0.2 mg/dL (ref 0.2–1.0)
pH, UA: 5.5 (ref 5.0–7.5)

## 2015-05-19 LAB — MICROSCOPIC EXAMINATION: RBC, UA: NONE SEEN /hpf (ref 0–?)

## 2015-05-19 LAB — BLADDER SCAN AMB NON-IMAGING: Scan Result: 78

## 2015-05-19 NOTE — Telephone Encounter (Signed)
Please notify patient and her daughter that her urine was not suspicious for an infection but I'm going to send it for urine culture to be sure. There is really results should be back by early next week and I will call her with them.Thanks

## 2015-05-19 NOTE — Progress Notes (Signed)
05/19/2015 4:04 PM   Ellis Parents Eleanora Neighbor Sep 15, 1935 [id number]  Referring provider: Glean Hess, MD [address] [place], [place] [address]  Chief Complaint  Patient presents with  . Follow-up  . Hematuria  . Vaginal Atrophy    HPI: Patient is a 79 year old female with a history of kidney stones presenting today for yearly follow-up visit. Microscopic hematuria including CT urogram and cystoscopy negative for GU pathology last year.  She was scheduled to have a KUB prior to this visit but did not get it because she recently broke her hip and did not feel that she could go.  Urinary complaints today include urinary urgency, occasional dysuria and intermittent difficulty initiating stream. She does experience occasional right-sided flank pain.  She was previously prescribed vaginal shortening cream but states that she used it for a few weeks and did not feel it wasn't working so she discontinued use. Has not been using vaginal estrogen cream as directed.    PMH: Past Medical History  Diagnosis Date  . DM (diabetes mellitus) ([place])   . Mitral valve prolapse     a. Not noted on 03/2015 Echo.  Marland Kitchen COPD (chronic obstructive pulmonary disease) ([place])   . Palpitations   . Osteoarthritis   . Systemic lupus erythematosus ([place])   . Lumbar spondylolysis   . Raynaud's disease   . Anxiety   . IBS (irritable bowel syndrome)   . Uveitis, anterior     ????  . Rheumatic fever   . Ankylosing spondylitis ([place])     ? how diagnosed  . Insomnia, unspecified   . Chronic peptic ulcer, unspecified site, without mention of hemorrhage, perforation, or obstruction   . History of kidney stones   . Chronic kidney infection   . Asthma     as child  . Hypothyroidism     mass, U/S 6/21 - nodules  . Motion sickness     car - back seat  . GERD (gastroesophageal reflux disease)   . Wears dentures     full upper, partial lower  . Wears contact lenses   . COPD (chronic obstructive  pulmonary disease) ([place])     a.Uses 2l prn - followed by Dr. Raul Del.  . Raynaud disease   . Headache   . Neuropathy ([place])   . Wheezing   . Chronic diastolic CHF (congestive heart failure) ([place])     a. 03/2015 Echo: EF 55-60%, Gr 1 DD.  Marland Kitchen PAF (paroxysmal atrial fibrillation) ([place])     a. 03/2015 in setting of hip Fx->Amio/Xarelo;  b. CHA2DS2VASc= 4.  . Left displaced femoral neck fracture ([place])     a. 03/2015 s/p L hemiarthroplasty.    Surgical History: Past Surgical History  Procedure Laterality Date  . Esophagogastroduodenoscopy  multiple  . Colonoscopy  multiple  . Total abdominal hysterectomy w/ bilateral salpingoophorectomy  1990  . Breast biopsy    . Biopsy thyroid    . Abdominal hysterectomy    . Esophagogastroduodenoscopy (egd) with propofol N/A 12/11/2014    Procedure: ESOPHAGOGASTRODUODENOSCOPY (EGD) WITH PROPOFOL;  Surgeon: [name], MD;  Location: [facility];  Service: Endoscopy;  Laterality: N/A;  cytology brushing  . Cataract extraction w/phaco Left 03/23/2015    Procedure: CATARACT EXTRACTION PHACO AND INTRAOCULAR LENS PLACEMENT (IOC);  Surgeon: Birder Robson, MD;  Location: [facility];  Service: Ophthalmology;  Laterality: Left;  [place] 00:51   . Orif hip fracture Left 03/2015  . Hip arthroplasty Left 03/26/2015    Procedure: ARTHROPLASTY  BIPOLAR HIP (HEMIARTHROPLASTY);  Surgeon: Earnestine Leys, MD;  Location: [facility];  Service: Orthopedics;  Laterality: Left;    Home Medications:    Medication List       This list is accurate as of: 05/19/15  4:04 PM.  Always use your most recent med list.               albuterol 108 (90 BASE) MCG/ACT inhaler  Commonly known as:  PROVENTIL HFA;VENTOLIN HFA  Inhale into the lungs 4 (four) times daily - after meals and at bedtime.     ALIGN PO  Take by mouth.     amiodarone 200 MG tablet  Commonly known as:  PACERONE  Take 1 tablet (200 mg total) by mouth daily. Take 200mg  twice per day for 2 weeks then 200mg   once per day     DUREZOL 0.05 % Emul  Generic drug:  Difluprednate  USE ONE DROP IN OPERATIVE EYE TWICE A DAY. START AFTER SURGERY     ferrous sulfate 325 (65 FE) MG tablet  TAKE 1 TABLET (325 MG TOTAL) BY MOUTH DAILY WITH BREAKFAST.     HYDROcodone-acetaminophen 5-325 MG tablet  Commonly known as:  NORCO/VICODIN  Take 1-2 tablets by mouth every 6 (six) hours as needed for moderate pain.     ILEVRO 0.3 % ophthalmic suspension  Generic drug:  nepafenac  USE 1 DROP IN OPERATIVE EYE ONCE A DAY AND ONCE ON MORNING OF SURGERY. BEGINNING 2 DAYS PRIOR     JANUVIA 100 MG tablet  Generic drug:  sitaGLIPtin  Take 50 mg by mouth daily.     LORazepam 1 MG tablet  Commonly known as:  ATIVAN  Take 0.5 tablets (0.5 mg total) by mouth 2 (two) times daily as needed.     methocarbamol 500 MG tablet  Commonly known as:  ROBAXIN  Take 1 tablet (500 mg total) by mouth every 6 (six) hours as needed for muscle spasms.     metoprolol tartrate 25 MG tablet  Commonly known as:  LOPRESSOR  Take 1 tablet (25 mg total) by mouth 2 (two) times daily.     moxifloxacin 0.5 % ophthalmic solution  Commonly known as:  VIGAMOX  1 drop QID.     ranitidine 150 MG tablet  Commonly known as:  ZANTAC  Take 150 mg by mouth 2 (two) times daily.     rivaroxaban 20 MG Tabs tablet  Commonly known as:  XARELTO  Take 1 tablet (20 mg total) by mouth daily.     senna 8.6 MG Tabs tablet  Commonly known as:  SENOKOT  Take 1 tablet (8.6 mg total) by mouth 2 (two) times daily.     sucralfate 1 G tablet  Commonly known as:  CARAFATE  Take 1 g by mouth 4 (four) times daily.     tiotropium 18 MCG inhalation capsule  Commonly known as:  SPIRIVA  Place 18 mcg into inhaler and inhale daily.        Allergies:  Allergies  Allergen Reactions  . Codeine Nausea And Vomiting  . Penicillins Hives  . Sulfa Antibiotics   . Doxycycline Rash  . Erythromycin Rash  . Latex Rash    Family History: Family History   Problem Relation Age of Onset  . Inflammatory bowel disease    . Ovarian cancer    . Diabetes    . Heart disease    . Irritable bowel syndrome    . Kidney disease    .  Heart disease Mother   . Hypothyroidism Mother   . Diabetes Mother   . Heart attack Mother   . Hypertension Mother   . Heart disease Father   . Heart attack Father   . Breast cancer Sister   . Hypothyroidism Sister   . Diabetes Sister   . Ovarian cancer Sister   . Diabetes Brother   . Prostate cancer Brother   . Hypothyroidism Daughter     Social History:  reports that she quit smoking about 3 years ago. Her smoking use included Cigarettes. She has a 10 pack-year smoking history. She has never used smokeless tobacco. She reports that she does not drink alcohol or use illicit drugs.  ROS: UROLOGY Frequent Urination?: No Hard to postpone urination?: Yes Burning/pain with urination?: Yes Get up at night to urinate?: No Leakage of urine?: No Urine stream starts and stops?: Yes Trouble starting stream?: Yes Do you have to strain to urinate?: No Blood in urine?: No Urinary tract infection?: No Sexually transmitted disease?: No Injury to kidneys or bladder?: No Painful intercourse?: No Weak stream?: No Currently pregnant?: No Vaginal bleeding?: No Last menstrual period?: n  Gastrointestinal Nausea?: No Vomiting?: No Indigestion/heartburn?: Yes Diarrhea?: No Constipation?: No  Constitutional Fever: No Night sweats?: No Weight loss?: No Fatigue?: Yes  Skin Skin rash/lesions?: No Itching?: No  Eyes Blurred vision?: Yes Double vision?: No  Ears/Nose/Throat Sore throat?: Yes Sinus problems?: No  Hematologic/Lymphatic Swollen glands?: No Easy bruising?: Yes  Cardiovascular Leg swelling?: Yes Chest pain?: No  Respiratory Cough?: No Shortness of breath?: Yes  Endocrine Excessive thirst?: No  Musculoskeletal Back pain?: Yes Joint pain?: Yes  Neurological Headaches?:  No Dizziness?: No  Psychologic Depression?: No Anxiety?: No  Physical Exam: BP 149/63 mmHg  Pulse 59  Resp 16  Ht 5\' 4"  (1.626 m)  Wt 122 lb 3.2 oz (55.43 kg)  BMI 20.97 kg/m2  Constitutional:  Alert and oriented, No acute distress, ambulating using a walker HEENT: [place] AT, moist mucus membranes.  Trachea midline, no masses. Cardiovascular: No clubbing, cyanosis, or edema. Respiratory: Normal respiratory effort, no increased work of breathing. Skin: No rashes, bruises or suspicious lesions. Neurologic: Grossly intact, no focal deficits, moving all 4 extremities. Psychiatric: Normal mood and affect.  Laboratory Data:   Urinalysis    Component Value Date/Time   COLORURINE YELLOW* 03/25/2015 2320   COLORURINE Yellow 07/16/2013 1208   APPEARANCEUR CLEAR* 03/25/2015 2320   APPEARANCEUR Hazy 07/16/2013 1208   LABSPEC 1.014 03/25/2015 2320   LABSPEC 1.028 07/16/2013 1208   PHURINE 7.0 03/25/2015 2320   PHURINE 6.0 07/16/2013 1208   GLUCOSEU NEGATIVE 03/25/2015 2320   GLUCOSEU >=500 07/16/2013 1208   HGBUR NEGATIVE 03/25/2015 2320   HGBUR Negative 07/16/2013 [place] 03/25/2015 2320   BILIRUBINUR Negative 07/16/2013 1208   KETONESUR 1+* 03/25/2015 2320   KETONESUR Trace 07/16/2013 [place] 03/25/2015 2320   PROTEINUR 30 mg/dL 07/16/2013 1208   NITRITE NEGATIVE 03/25/2015 2320   NITRITE Negative 07/16/2013 1208   LEUKOCYTESUR TRACE* 03/25/2015 2320   LEUKOCYTESUR 3+ 07/16/2013 1208    Pertinent Imaging:   Assessment & Plan:    1. Urinary frequency/urgency- UA unremarkable. PVR 23mL. Patient declined a trial of OAB medications at this time.  We will reassess at follow-up visit once KUB is obtained. - Urinalysis, Complete -Urine Culture - BLADDER SCAN AMB NON-IMAGING  2. Vaginal Atrophy- Patient was given a sample of vaginal estrogen cream and instructed to apply a  pea-sized amount just inside the vaginal introitus to urethra with a  finger-tip every night for two weeks and then Monday, Wednesday and Friday nights. General risks of HRT reviewed though I explained to the patient that vaginally administered estrogen, which causes only a slight increase in the blood estrogen levels, have fewer contraindications and adverse systemic effects that oral HT. Patient encouraged to start using her vaginal estrogen cream again as directed.  3. Kidney Stones- Patient will obtain her KUB and I will call her with the results.    Return in about 3 months (around 08/17/2015).  These notes generated with voice recognition software. I apologize for typographical errors.  Herbert Moors, [facility] [address] [place], [place] [address] [phone]

## 2015-05-19 NOTE — Patient Instructions (Signed)
Vaginal Estrogen Cream-  Apply blueberry sized amount of cream using finger tip to vaginal opening (urethra)                                                 Urinary Tract Infection Prevention Patient Education Stay Hydrated: Urinary tract infections (UTIs) are less likely to occur in someone who is drinking enough water to promote regular urination, so it is very important to stay hydrated in order to help flush out bacteria from the urinary tract. Respond to "Nature's Call": It is always a good idea to urinate as soon as you feel the need. While "holding it in" does not directly cause an infection, it can cause overdistension that can damage the lining of the bladder, making it more vulnerable to bacteria. Remove Tampons Before Going: Remember to always take out tampons before urinating, and change tampons often.  Practice Proper Bathroom Hygiene: To keep bacteria near the urethral opening to a minimum, it is important to practice proper wiping techniques (i.e. front to back wiping) to help prevent rectal bacteria from entering the uretro-genital area. It can also be helpful to take showers and avoid soaking in the bathtub.  Take a Vitamin C Supplement: About 1,000 milligrams of vitamin C taken daily can help inhibit the growth of some bacteria by acidifying the urine. Maintain Control with Cranberries: Cranberries contain hippuronic acid, which is a natural antiseptic that may help prevent the adherence of bacteria to the bladder lining. Drinking 100% pure cranberry juice or taking over the counter cranberry supplements twice daily may help to prevent an infection. However, it is important to note that cranberry juices/supplements are not helpful once a urinary tract infection (UTI) is present. Strengthen Your Core: Often, a lazy bladder (unable to empty urine properly) occurs due to lower back problem, so consider doing exercises to help strengthen your back, pelvic floor, and stomach  muscles.  Pay Attention to Your Urine: Your urine can change color for a variety of reasons, including from the medications you take, so pay close attention to it to monitor your overall health. One key thing to note is that if your urine is typically a darker yellow, your body is dehydrated, so you need to step up your water intake.

## 2015-05-19 NOTE — Telephone Encounter (Signed)
No answer

## 2015-05-20 NOTE — Telephone Encounter (Signed)
Spoke with pt who requested a call back in 5 min. Made pt aware to call us when she gets a chance.

## 2015-05-20 NOTE — Telephone Encounter (Signed)
Spoke with pt in reference to -ucx. Pt voiced understanding.  

## 2015-05-24 ENCOUNTER — Ambulatory Visit: Payer: Medicare Other

## 2015-05-25 ENCOUNTER — Ambulatory Visit: Payer: Medicare Other | Admitting: Anesthesiology

## 2015-05-25 ENCOUNTER — Encounter: Payer: Self-pay | Admitting: *Deleted

## 2015-05-25 ENCOUNTER — Ambulatory Visit
Admission: RE | Admit: 2015-05-25 | Discharge: 2015-05-25 | Disposition: A | Discharge status: Home / Self Care | Payer: Medicare Other | Source: Ambulatory Visit | Attending: Ophthalmology | Admitting: Ophthalmology

## 2015-05-25 ENCOUNTER — Encounter
Admission: RE | Disposition: A | Discharge status: Home / Self Care | Payer: Self-pay | Source: Ambulatory Visit | Attending: Ophthalmology

## 2015-05-25 DIAGNOSIS — M1990 Unspecified osteoarthritis, unspecified site: Secondary | ICD-10-CM | POA: Insufficient documentation

## 2015-05-25 DIAGNOSIS — K219 Gastro-esophageal reflux disease without esophagitis: Secondary | ICD-10-CM | POA: Insufficient documentation

## 2015-05-25 DIAGNOSIS — Z90710 Acquired absence of both cervix and uterus: Secondary | ICD-10-CM | POA: Insufficient documentation

## 2015-05-25 DIAGNOSIS — I7300 Raynaud's syndrome without gangrene: Secondary | ICD-10-CM | POA: Insufficient documentation

## 2015-05-25 DIAGNOSIS — I341 Nonrheumatic mitral (valve) prolapse: Secondary | ICD-10-CM | POA: Diagnosis not present

## 2015-05-25 DIAGNOSIS — Z794 Long term (current) use of insulin: Secondary | ICD-10-CM | POA: Diagnosis not present

## 2015-05-25 DIAGNOSIS — M329 Systemic lupus erythematosus, unspecified: Secondary | ICD-10-CM | POA: Diagnosis not present

## 2015-05-25 DIAGNOSIS — F419 Anxiety disorder, unspecified: Secondary | ICD-10-CM | POA: Diagnosis not present

## 2015-05-25 DIAGNOSIS — Z882 Allergy status to sulfonamides status: Secondary | ICD-10-CM | POA: Diagnosis not present

## 2015-05-25 DIAGNOSIS — Z9981 Dependence on supplemental oxygen: Secondary | ICD-10-CM | POA: Diagnosis not present

## 2015-05-25 DIAGNOSIS — Z833 Family history of diabetes mellitus: Secondary | ICD-10-CM | POA: Insufficient documentation

## 2015-05-25 DIAGNOSIS — I480 Paroxysmal atrial fibrillation: Secondary | ICD-10-CM | POA: Insufficient documentation

## 2015-05-25 DIAGNOSIS — E039 Hypothyroidism, unspecified: Secondary | ICD-10-CM | POA: Insufficient documentation

## 2015-05-25 DIAGNOSIS — Z8249 Family history of ischemic heart disease and other diseases of the circulatory system: Secondary | ICD-10-CM | POA: Insufficient documentation

## 2015-05-25 DIAGNOSIS — H2511 Age-related nuclear cataract, right eye: Secondary | ICD-10-CM | POA: Diagnosis not present

## 2015-05-25 DIAGNOSIS — Z79899 Other long term (current) drug therapy: Secondary | ICD-10-CM | POA: Insufficient documentation

## 2015-05-25 DIAGNOSIS — Z881 Allergy status to other antibiotic agents status: Secondary | ICD-10-CM | POA: Insufficient documentation

## 2015-05-25 DIAGNOSIS — E119 Type 2 diabetes mellitus without complications: Secondary | ICD-10-CM | POA: Diagnosis not present

## 2015-05-25 DIAGNOSIS — Z91040 Latex allergy status: Secondary | ICD-10-CM | POA: Insufficient documentation

## 2015-05-25 DIAGNOSIS — J449 Chronic obstructive pulmonary disease, unspecified: Secondary | ICD-10-CM | POA: Diagnosis not present

## 2015-05-25 DIAGNOSIS — Z880 Allergy status to penicillin: Secondary | ICD-10-CM | POA: Insufficient documentation

## 2015-05-25 HISTORY — PX: CATARACT EXTRACTION W/PHACO: SHX586

## 2015-05-25 LAB — GLUCOSE, CAPILLARY: GLUCOSE-CAPILLARY: 143 mg/dL — AB (ref 65–99)

## 2015-05-25 SURGERY — PHACOEMULSIFICATION, CATARACT, WITH IOL INSERTION
Anesthesia: Monitor Anesthesia Care | Site: Eye | Laterality: Right | Wound class: Clean

## 2015-05-25 MED ORDER — CEFUROXIME OPHTHALMIC INJECTION 1 MG/0.1 ML
INJECTION | OPHTHALMIC | Status: AC
  Filled 2015-05-25: qty 0.1

## 2015-05-25 MED ORDER — NA CHONDROIT SULF-NA HYALURON 40-17 MG/ML IO SOLN
INTRAOCULAR | Status: DC | PRN
  Administered 2015-05-25: 1 mL via INTRAOCULAR

## 2015-05-25 MED ORDER — TETRACAINE HCL 0.5 % OP SOLN
OPHTHALMIC | Status: AC
  Administered 2015-05-25: 1 [drp] via OPHTHALMIC
  Filled 2015-05-25: qty 2

## 2015-05-25 MED ORDER — MIDAZOLAM HCL 2 MG/2ML IJ SOLN
INTRAMUSCULAR | Status: DC | PRN
  Administered 2015-05-25: 1 mg via INTRAVENOUS

## 2015-05-25 MED ORDER — NA CHONDROIT SULF-NA HYALURON 40-17 MG/ML IO SOLN
INTRAOCULAR | Status: AC
  Filled 2015-05-25: qty 1

## 2015-05-25 MED ORDER — EPINEPHRINE HCL 1 MG/ML IJ SOLN
INTRAOCULAR | Status: DC | PRN
  Administered 2015-05-25: 1 mL via OPHTHALMIC

## 2015-05-25 MED ORDER — MOXIFLOXACIN HCL 0.5 % OP SOLN: 1.0000 [drp] | OPHTHALMIC | Status: DC | PRN

## 2015-05-25 MED ORDER — LIDOCAINE HCL (PF) 1 % IJ SOLN
INTRAMUSCULAR | Status: AC
  Filled 2015-05-25: qty 2

## 2015-05-25 MED ORDER — SODIUM CHLORIDE 0.9 % IV SOLN
INTRAVENOUS | Status: DC
  Administered 2015-05-25 (×2): via INTRAVENOUS

## 2015-05-25 MED ORDER — MOXIFLOXACIN HCL 0.5 % OP SOLN
OPHTHALMIC | Status: AC
  Filled 2015-05-25: qty 3

## 2015-05-25 MED ORDER — POVIDONE-IODINE 5 % OP SOLN
1.0000 "application " | OPHTHALMIC | Status: AC | PRN
  Administered 2015-05-25: 1 via OPHTHALMIC

## 2015-05-25 MED ORDER — MOXIFLOXACIN HCL 0.5 % OP SOLN
OPHTHALMIC | Status: DC | PRN
  Administered 2015-05-25: 1 [drp] via OPHTHALMIC

## 2015-05-25 MED ORDER — FENTANYL CITRATE (PF) 100 MCG/2ML IJ SOLN
INTRAMUSCULAR | Status: DC | PRN
  Administered 2015-05-25: 50 ug via INTRAVENOUS

## 2015-05-25 MED ORDER — ACETYLCHOLINE CHLORIDE 20 MG IO SOLR
INTRAOCULAR | Status: DC | PRN
  Administered 2015-05-25: .5 mg via INTRAOCULAR

## 2015-05-25 MED ORDER — ARMC OPHTHALMIC DILATING GEL
OPHTHALMIC | Status: AC
  Administered 2015-05-25: 1 via OPHTHALMIC
  Filled 2015-05-25: qty 0.25

## 2015-05-25 MED ORDER — EPINEPHRINE HCL 1 MG/ML IJ SOLN
INTRAMUSCULAR | Status: AC
  Filled 2015-05-25: qty 1

## 2015-05-25 MED ORDER — TETRACAINE HCL 0.5 % OP SOLN
1.0000 [drp] | OPHTHALMIC | Status: AC | PRN
  Administered 2015-05-25: 1 [drp] via OPHTHALMIC

## 2015-05-25 MED ORDER — ACETYLCHOLINE CHLORIDE 20 MG IO SOLR
INTRAOCULAR | Status: AC
  Filled 2015-05-25: qty 1

## 2015-05-25 MED ORDER — POVIDONE-IODINE 5 % OP SOLN
OPHTHALMIC | Status: AC
  Administered 2015-05-25: 1 via OPHTHALMIC
  Filled 2015-05-25: qty 30

## 2015-05-25 MED ORDER — ARMC OPHTHALMIC DILATING GEL
1.0000 "application " | OPHTHALMIC | Status: AC | PRN
  Administered 2015-05-25 (×2): 1 via OPHTHALMIC

## 2015-05-25 SURGICAL SUPPLY — 22 items
CANNULA ANT/CHMB 27GA (MISCELLANEOUS) ×3 IMPLANT
CUP MEDICINE 2OZ PLAST GRAD ST (MISCELLANEOUS) ×3 IMPLANT
GLOVE BIO SURGEON STRL SZ8 (GLOVE) ×3 IMPLANT
GLOVE BIOGEL M 6.5 STRL (GLOVE) ×3 IMPLANT
GLOVE SURG LX 8.0 MICRO (GLOVE) ×2
GLOVE SURG LX STRL 8.0 MICRO (GLOVE) ×1 IMPLANT
GOWN STRL REUS W/ TWL LRG LVL3 (GOWN DISPOSABLE) ×2 IMPLANT
GOWN STRL REUS W/TWL LRG LVL3 (GOWN DISPOSABLE) ×4
LENS IOL TECNIS 15.0 (Intraocular Lens) ×3 IMPLANT
LENS IOL TECNIS MONO 1P 15.0 (Intraocular Lens) ×1 IMPLANT
PACK CATARACT (MISCELLANEOUS) ×3 IMPLANT
PACK CATARACT BRASINGTON LX (MISCELLANEOUS) ×3 IMPLANT
PACK EYE AFTER SURG (MISCELLANEOUS) ×3 IMPLANT
SOL BSS BAG (MISCELLANEOUS) ×3
SOL PREP PVP 2OZ (MISCELLANEOUS) ×3
SOLUTION BSS BAG (MISCELLANEOUS) ×1 IMPLANT
SOLUTION PREP PVP 2OZ (MISCELLANEOUS) ×1 IMPLANT
SYR 3ML LL SCALE MARK (SYRINGE) ×3 IMPLANT
SYR 5ML LL (SYRINGE) ×3 IMPLANT
SYR TB 1ML 27GX1/2 LL (SYRINGE) ×3 IMPLANT
WATER STERILE IRR 1000ML POUR (IV SOLUTION) ×3 IMPLANT
WIPE NON LINTING 3.25X3.25 (MISCELLANEOUS) ×3 IMPLANT

## 2015-05-25 NOTE — Anesthesia Procedure Notes (Signed)
Procedure Name: MAC Date/Time: 05/25/2015 8:41 AM Performed by: Nelda Marseille Pre-anesthesia Checklist: Patient identified, Emergency Drugs available, Suction available, Patient being monitored and Timeout performed Oxygen Delivery Method: Nasal cannula

## 2015-05-25 NOTE — Anesthesia Preprocedure Evaluation (Signed)
Anesthesia Evaluation  Patient identified by MRN, date of birth, ID band Patient awake    Reviewed: Allergy & Precautions, NPO status , Patient's Chart, lab work & pertinent test results  Airway Mallampati: II       Dental  (+) Upper Dentures, Partial Lower   Pulmonary neg pulmonary ROS, COPD, former smoker,    breath sounds clear to auscultation       Cardiovascular + Peripheral Vascular Disease   Rhythm:Regular     Neuro/Psych  Headaches, Anxiety    GI/Hepatic PUD, GERD  Medicated,  Endo/Other  diabetes, Type 2Hypothyroidism   Renal/GU      Musculoskeletal   Abdominal   Peds  Hematology   Anesthesia Other Findings   Reproductive/Obstetrics                             Anesthesia Physical Anesthesia Plan  ASA: II  Anesthesia Plan: MAC   Post-op Pain Management:    Induction: Intravenous  Airway Management Planned: Nasal Cannula  Additional Equipment:   Intra-op Plan:   Post-operative Plan:   Informed Consent: I have reviewed the patients History and Physical, chart, labs and discussed the procedure including the risks, benefits and alternatives for the proposed anesthesia with the patient or authorized representative who has indicated his/her understanding and acceptance.     Plan Discussed with: CRNA  Anesthesia Plan Comments:         Anesthesia Quick Evaluation

## 2015-05-25 NOTE — Anesthesia Postprocedure Evaluation (Signed)
Anesthesia Post Note  Patient: [name]  Procedure(s) Performed: Procedure(s) (LRB): CATARACT EXTRACTION PHACO AND INTRAOCULAR LENS PLACEMENT (IOC) (Right)  Patient location during evaluation: PACU Anesthesia Type: MAC Level of consciousness: awake and alert Pain management: satisfactory to patient Respiratory status: respiratory function stable Cardiovascular status: stable Anesthetic complications: no    Last Vitals:  Filed Vitals:   05/25/15 0717 05/25/15 0900  BP: 159/88 169/70  Pulse:  60  Temp: 36.8 C 36.7 C  Resp: 16 16    Last Pain:  Filed Vitals:   05/25/15 0907  PainSc: 0-No pain                 VAN STAVEREN,Lendy Dittrich

## 2015-05-25 NOTE — Discharge Instructions (Signed)
Eye Surgery Discharge Instructions  Expect mild scratchy sensation or mild soreness. DO NOT RUB YOUR EYE!  The day of surgery:  Minimal physical activity, but bed rest is not required  No reading, computer work, or close hand work  No bending, lifting, or straining.  May watch TV  For 24 hours:  No driving, legal decisions, or alcoholic beverages  Safety precautions  Eat anything you prefer: It is better to start with liquids, then soup then solid foods.  _____ Eye patch should be worn until postoperative exam tomorrow.  ____ Solar shield eyeglasses should be worn for comfort in the sunlight/patch while sleeping  Resume all regular medications including aspirin or Coumadin if these were discontinued prior to surgery. You may shower, bathe, shave, or wash your hair. Tylenol may be taken for mild discomfort.  Call your doctor if you experience significant pain, nausea, or vomiting, fever > 101 or other signs of infection. [phone] or [phone] Specific instructions:  Follow-up Information    Follow up with Tim Lair, MD On 05/26/2015.   Specialty:  Ophthalmology   Why:  9:00   Contact information:   [address] [place] [address] [phone]

## 2015-05-25 NOTE — H&P (Signed)
  All labs reviewed. Abnormal studies sent to patients PCP when indicated.  Previous H&P reviewed, patient examined, there are NO CHANGES.  [name]12/13/20168:57 AM

## 2015-05-25 NOTE — Op Note (Signed)
PREOPERATIVE DIAGNOSIS:  Nuclear sclerotic cataract of the right eye.   POSTOPERATIVE DIAGNOSIS:  nuclear sclerotic cataract right eye   OPERATIVE PROCEDURE: Procedure(s): CATARACT EXTRACTION PHACO AND INTRAOCULAR LENS PLACEMENT (IOC)   SURGEON:  Birder Robson, MD.   ANESTHESIA:  Anesthesiologist: Gijsbertus Lonia Mad, MD CRNA: Nelda Marseille, CRNA  1.      Managed anesthesia care. 2.      Topical tetracaine drops followed by 2% Xylocaine jelly applied in the preoperative holding area.   COMPLICATIONS:  None.   TECHNIQUE:   Stop and chop   DESCRIPTION OF PROCEDURE:  The patient was examined and consented in the preoperative holding area where the aforementioned topical anesthesia was applied to the right eye and then brought back to the Operating Room where the right eye was prepped and draped in the usual sterile ophthalmic fashion and a lid speculum was placed. A paracentesis was created with the side port blade and the anterior chamber was filled with viscoelastic. A near clear corneal incision was performed with the steel keratome. A continuous curvilinear capsulorrhexis was performed with a cystotome followed by the capsulorrhexis forceps. Hydrodissection and hydrodelineation were carried out with BSS on a blunt cannula. The lens was removed in a stop and chop  technique and the remaining cortical material was removed with the irrigation-aspiration handpiece. The capsular bag was inflated with viscoelastic and the Technis ZCB00  lens was placed in the capsular bag without complication. The remaining viscoelastic was removed from the eye with the irrigation-aspiration handpiece. The wounds were hydrated. The anterior chamber was flushed with Miostat and the eye was inflated to physiologic pressure. 0.2 mL of Vigamox diluted three/one with BSS was placed in the anterior chamber. The wounds were found to be water tight. The eye was dressed with Vigamox. The patient was given protective  glasses to wear throughout the day and a shield with which to sleep tonight. The patient was also given drops with which to begin a drop regimen today and will follow-up with me in one day.  Implant Name Type Inv. Item Serial No. Manufacturer Lot No. LRB No. Used  LENS IMPL INTRAOC ZCB00 15.0 - [id number] Intraocular Lens LENS IMPL INTRAOC ZCB00 15.0 [id number]   Right 1   Procedure(s) with comments: CATARACT EXTRACTION PHACO AND INTRAOCULAR LENS PLACEMENT (IOC) (Right) - [place] 00:57 AP% 15.8 CDE 9.02 fluid pack lot # [id number]  Electronically signed: [place] 05/25/2015 8:56 AM

## 2015-05-25 NOTE — Transfer of Care (Signed)
Immediate Anesthesia Transfer of Care Note  Patient: [name]  Procedure(s) Performed: Procedure(s) with comments: CATARACT EXTRACTION PHACO AND INTRAOCULAR LENS PLACEMENT (IOC) (Right) - [place] 00:57 AP% 15.8 CDE 9.02 fluid pack lot # [id number]  Patient Location: PACU  Anesthesia Type:MAC  Level of Consciousness: awake and alert   Airway & Oxygen Therapy: Patient Spontanous Breathing  Post-op Assessment: Report given to RN and Post -op Vital signs reviewed and stable  Post vital signs: Reviewed and stable  Last Vitals:  Filed Vitals:   05/25/15 0717  BP: 159/88  Temp: 36.8 C  Resp: 16    Complications: No apparent anesthesia complications

## 2015-05-26 ENCOUNTER — Other Ambulatory Visit: Payer: Self-pay

## 2015-05-26 ENCOUNTER — Telehealth: Payer: Self-pay | Admitting: *Deleted

## 2015-05-26 MED ORDER — RIVAROXABAN 20 MG PO TABS: 20.0000 mg | ORAL_TABLET | Freq: Every day | ORAL | Status: DC

## 2015-05-26 NOTE — Telephone Encounter (Signed)
S/w pt who reports increased bruising about an hour ago. She pushed some clothes over in her closet and bruised her hand. States the 2/3 top of her hand is blue Reports bruising on both arms. Denies any other signs of bleeding.  States for approximately 2 years, she has noticed increased bleeding if she accidentally cuts herself and has been known to "soak up a paper towel" before bleeding stopped. She had hip surgery 6 weeks ago, developed afib and was placed on 20mg  xarelto. She is concerned about taking xarelto and is asking about cutting pills in half. Explained to pt reason for taking this medication and advised her not to discontinue unless instructed by MD.  States she called Dr. Ammie Ferrier office and was told she needed to call us for an appt with Dr. Fletcher Anon. I will ask Ignacia Bayley, NP to review and advise as he saw pt for 11/10 OV.

## 2015-05-26 NOTE — Telephone Encounter (Signed)
Patient called and is worried due to bad bruising. Does this patient need to stay on rivaroxaban (XARELTO) 20 MG TABS tablet ?

## 2015-05-26 NOTE — Telephone Encounter (Signed)
Per verbal from Ignacia Bayley, have pt continue xarelto. No changes needed at this time.  S/w pt of Chris's instructions to continue xarelto at prescribed dose. She states her family is concerned that she is taking so much medication. Reviewed reasons to take xarelto and advised pt to take all medications as prescribed by her various doctors. If she has a questions about any of them, she is to call ordering doctor's office for clarification. Pt agreeable w/plan States she needs xarelto refills. Prescription submitted to [facility].

## 2015-05-31 DIAGNOSIS — R2689 Other abnormalities of gait and mobility: Secondary | ICD-10-CM | POA: Diagnosis not present

## 2015-05-31 DIAGNOSIS — M25552 Pain in left hip: Secondary | ICD-10-CM | POA: Diagnosis not present

## 2015-06-01 ENCOUNTER — Other Ambulatory Visit: Payer: Self-pay | Admitting: Internal Medicine

## 2015-06-01 ENCOUNTER — Other Ambulatory Visit: Payer: Self-pay

## 2015-06-01 MED ORDER — METOPROLOL TARTRATE 25 MG PO TABS: 25.0000 mg | ORAL_TABLET | Freq: Two times a day (BID) | ORAL | Status: DC

## 2015-06-01 MED ORDER — AMIODARONE HCL 200 MG PO TABS: 200.0000 mg | ORAL_TABLET | Freq: Every day | ORAL | Status: DC

## 2015-06-17 DIAGNOSIS — M25552 Pain in left hip: Secondary | ICD-10-CM | POA: Diagnosis not present

## 2015-06-17 DIAGNOSIS — R2689 Other abnormalities of gait and mobility: Secondary | ICD-10-CM | POA: Diagnosis not present

## 2015-06-21 DIAGNOSIS — R2689 Other abnormalities of gait and mobility: Secondary | ICD-10-CM | POA: Diagnosis not present

## 2015-06-21 DIAGNOSIS — M25552 Pain in left hip: Secondary | ICD-10-CM | POA: Diagnosis not present

## 2015-06-22 ENCOUNTER — Other Ambulatory Visit: Payer: Self-pay

## 2015-06-22 MED ORDER — SENNA 8.6 MG PO TABS: 1.0000 | ORAL_TABLET | Freq: Two times a day (BID) | ORAL | Status: DC

## 2015-06-22 MED ORDER — RIVAROXABAN 20 MG PO TABS: 20.0000 mg | ORAL_TABLET | Freq: Every day | ORAL | Status: DC

## 2015-06-22 MED ORDER — RANITIDINE HCL 150 MG PO TABS: 150.0000 mg | ORAL_TABLET | Freq: Two times a day (BID) | ORAL | Status: DC

## 2015-06-22 MED ORDER — TIOTROPIUM BROMIDE MONOHYDRATE 18 MCG IN CAPS: 18.0000 ug | ORAL_CAPSULE | Freq: Every day | RESPIRATORY_TRACT | Status: DC

## 2015-06-22 MED ORDER — JANUVIA 100 MG PO TABS: 50.0000 mg | ORAL_TABLET | Freq: Every day | ORAL | Status: DC

## 2015-06-22 MED ORDER — AMIODARONE HCL 200 MG PO TABS: 200.0000 mg | ORAL_TABLET | Freq: Every day | ORAL | Status: DC

## 2015-06-22 MED ORDER — METOPROLOL TARTRATE 25 MG PO TABS: 25.0000 mg | ORAL_TABLET | Freq: Two times a day (BID) | ORAL | Status: DC

## 2015-06-22 MED ORDER — ALIGN 4 MG PO CAPS: 1.0000 | ORAL_CAPSULE | Freq: Every day | ORAL | Status: AC

## 2015-06-22 NOTE — Telephone Encounter (Signed)
Patient called requesting that these medications be called in to the [facility]. Thanks

## 2015-06-24 DIAGNOSIS — M25552 Pain in left hip: Secondary | ICD-10-CM | POA: Diagnosis not present

## 2015-06-24 DIAGNOSIS — R2689 Other abnormalities of gait and mobility: Secondary | ICD-10-CM | POA: Diagnosis not present

## 2015-06-25 ENCOUNTER — Encounter: Payer: Self-pay | Admitting: Internal Medicine

## 2015-06-25 ENCOUNTER — Ambulatory Visit (INDEPENDENT_AMBULATORY_CARE_PROVIDER_SITE_OTHER): Payer: Medicare Other | Admitting: Internal Medicine

## 2015-06-25 VITALS — BP 132/78 | HR 60 | Ht 64.0 in | Wt 118.2 lb

## 2015-06-25 DIAGNOSIS — R410 Disorientation, unspecified: Secondary | ICD-10-CM

## 2015-06-25 DIAGNOSIS — S72001D Fracture of unspecified part of neck of right femur, subsequent encounter for closed fracture with routine healing: Secondary | ICD-10-CM

## 2015-06-25 DIAGNOSIS — L03119 Cellulitis of unspecified part of limb: Secondary | ICD-10-CM | POA: Diagnosis not present

## 2015-06-25 DIAGNOSIS — I5032 Chronic diastolic (congestive) heart failure: Secondary | ICD-10-CM | POA: Diagnosis not present

## 2015-06-25 DIAGNOSIS — I480 Paroxysmal atrial fibrillation: Secondary | ICD-10-CM | POA: Insufficient documentation

## 2015-06-25 MED ORDER — CEPHALEXIN 500 MG PO CAPS: 500.0000 mg | ORAL_CAPSULE | Freq: Four times a day (QID) | ORAL | Status: DC

## 2015-06-25 NOTE — Progress Notes (Signed)
Date:  06/25/2015   Name:  [name]   DOB:  January 04, 1936   MRN:  [id number]   Chief Complaint: Foot Pain Foot Pain This is a new problem. The current episode started 1 to 4 weeks ago. The problem occurs constantly. The problem has been unchanged. Associated symptoms include arthralgias, fatigue and weakness (and decreased balance). Pertinent negatives include no abdominal pain, chest pain, chills, fever, headaches, nausea or numbness.  She sustained abrasion to her left posterior heel about 2 weeks ago. Since then there has been tender and painful to bear weight. Her physical therapist is working with her from her hip fracture recommended that she see me.  Hip fracture - status post surgery in November. Doing fairly well at home with physical therapy. 2 more sessions are planned. Patient is concerned because she feels like her balance is very poor. Her husband confirms that she does seem to lose her balance frequently but has not fallen. She has a walker to use around the home.  Atrial fibrillation - patient reportedly had intermittent atrial fibrillation for some time. However during hospitalization post surgery she was noted to have rapid A. Fib. Cardiology placed her on metoprolol and amiodarone as well as Xarelto. As result of medications according to her husband she's been more fatigued with much less energy. She does not know when she is in A. Fib but believes that she is staying in sinus rhythm at this time.  Episodes of confusion - the past several days her husband has noted episodes of confusion lasting only about 15-30 minutes. The first episode EMS came but found that she was in sinus rhythm and no other abnormalities. She became oriented spontaneously. According to patient and husband these episodes were not associated with atrial fibrillation or lack of oxygen use. At this point her husband is concerned about leaving her alone because he is afraid she might fall. Her balance  has been worse since her foot injury.  Diabetes - blood sugars have been well controlled. She is on Januvia once daily. She denies any hypoglycemic episodes. The episodes of confusion were not associated with skipping meals, sweats, or dizziness.  Review of Systems  Constitutional: Positive for fatigue. Negative for fever, chills and unexpected weight change.  Eyes: Negative for visual disturbance.  Respiratory: Negative for chest tightness, shortness of breath and wheezing.   Cardiovascular: Negative for chest pain, palpitations and leg swelling.  Gastrointestinal: Negative for nausea, abdominal pain and diarrhea.  Musculoskeletal: Positive for arthralgias.  Skin: Positive for wound.  Neurological: Positive for weakness (and decreased balance). Negative for dizziness, speech difficulty, numbness and headaches.  Psychiatric/Behavioral: Positive for confusion. Negative for sleep disturbance. The patient is nervous/anxious.     Patient Active Problem List   Diagnosis Date Noted  . Chronic diastolic CHF (congestive heart failure) ([place])   . Atrial fibrillation ([place])   . Left displaced femoral neck fracture ([place])   . Acute diastolic CHF (congestive heart failure) ([place])   . SOB (shortness of breath)   . Fall   . S/P hip hemiarthroplasty   . Atrial fibrillation with RVR ([place])   . GERD (gastroesophageal reflux disease) 03/26/2015  . Anxiety 03/26/2015  . Femoral neck fracture, left, closed, initial encounter 03/26/2015  . Gastroduodenal ulcer 01/05/2015  . Difficulty swallowing solids   . Esophageal candidiasis ([place])   . Acquired hypothyroidism 08/18/2014  . Diabetes mellitus, type 2 ([place]) 08/18/2014  . Big thyroid 04/06/2014  . Disseminated lupus erythematosus ([place])  04/03/2014  . Palpitations 04/25/2013  . On home oxygen therapy - prn 04/19/2013  . COPD, severe ([place]) 04/19/2013    Prior to Admission medications   Medication Sig Start Date End Date Taking? Authorizing Provider    albuterol (PROVENTIL HFA;VENTOLIN HFA) 108 (90 BASE) MCG/ACT inhaler Inhale into the lungs 4 (four) times daily - after meals and at bedtime.    Yes Historical Provider, MD  albuterol (PROVENTIL) (2.5 MG/3ML) 0.083% nebulizer solution Take 2.5 mg by nebulization every 6 (six) hours as needed for wheezing or shortness of breath.   Yes Historical Provider, MD  amiodarone (PACERONE) 200 MG tablet Take 1 tablet (200 mg total) by mouth daily. 06/22/15  Yes Glean Hess, MD  DUREZOL 0.05 % EMUL USE ONE DROP IN OPERATIVE EYE TWICE A DAY. START AFTER SURGERY 03/20/15  Yes Historical Provider, MD  ferrous sulfate 325 (65 FE) MG tablet Reported on 06/25/2015 04/20/15  Yes Historical Provider, MD  ILEVRO 0.3 % ophthalmic suspension USE 1 DROP IN OPERATIVE EYE ONCE A DAY AND ONCE ON MORNING OF SURGERY. BEGINNING 2 DAYS PRIOR 03/20/15  Yes Historical Provider, MD  JANUVIA 100 MG tablet Take 0.5 tablets (50 mg total) by mouth daily. 06/22/15  Yes Glean Hess, MD  LORazepam (ATIVAN) 1 MG tablet Take 0.5 tablets (0.5 mg total) by mouth 2 (two) times daily as needed. 02/22/15  Yes Glean Hess, MD  methocarbamol (ROBAXIN) 500 MG tablet Take 1 tablet (500 mg total) by mouth every 6 (six) hours as needed for muscle spasms. 04/01/15  Yes Epifanio Lesches, MD  mometasone-formoterol (DULERA) 100-5 MCG/ACT AERO Inhale 2 puffs into the lungs 2 (two) times daily.   Yes Historical Provider, MD  moxifloxacin (VIGAMOX) 0.5 % ophthalmic solution 1 drop QID.   Yes Historical Provider, MD  Probiotic Product (ALIGN) 4 MG CAPS Take 1 capsule (4 mg total) by mouth daily. 06/22/15  Yes Glean Hess, MD  ranitidine (ZANTAC) 150 MG tablet Take 1 tablet (150 mg total) by mouth 2 (two) times daily. 06/22/15  Yes Glean Hess, MD  rivaroxaban (XARELTO) 20 MG TABS tablet Take 1 tablet (20 mg total) by mouth daily. 06/22/15  Yes Glean Hess, MD  senna (SENOKOT) 8.6 MG TABS tablet Take 1 tablet (8.6 mg total) by mouth 2  (two) times daily. 06/22/15  Yes Glean Hess, MD  sucralfate (CARAFATE) 1 G tablet Take 1 g by mouth 4 (four) times daily.   Yes Historical Provider, MD  tiotropium (SPIRIVA) 18 MCG inhalation capsule Place 1 capsule (18 mcg total) into inhaler and inhale daily. 06/22/15  Yes Glean Hess, MD  linagliptin (TRADJENTA) 5 MG TABS tablet Take 5 mg by mouth daily. Reported on 06/25/2015    Historical Provider, MD  metoprolol tartrate (LOPRESSOR) 25 MG tablet Take 1 tablet (25 mg total) by mouth 2 (two) times daily. Patient not taking: Reported on 06/25/2015 06/22/15   Glean Hess, MD    Allergies  Allergen Reactions  . Codeine Nausea And Vomiting  . Penicillins Hives  . Sulfa Antibiotics   . Doxycycline Rash  . Erythromycin Rash  . Latex Rash    Past Surgical History  Procedure Laterality Date  . Esophagogastroduodenoscopy  multiple  . Colonoscopy  multiple  . Total abdominal hysterectomy w/ bilateral salpingoophorectomy  1990  . Breast biopsy    . Biopsy thyroid    . Abdominal hysterectomy    . Esophagogastroduodenoscopy (egd) with propofol N/A 12/11/2014  Procedure: ESOPHAGOGASTRODUODENOSCOPY (EGD) WITH PROPOFOL;  Surgeon: [name], MD;  Location: [facility];  Service: Endoscopy;  Laterality: N/A;  cytology brushing  . Cataract extraction w/phaco Left 03/23/2015    Procedure: CATARACT EXTRACTION PHACO AND INTRAOCULAR LENS PLACEMENT (IOC);  Surgeon: Birder Robson, MD;  Location: [facility];  Service: Ophthalmology;  Laterality: Left;  [place] 00:51   . Orif hip fracture Left 03/2015  . Hip arthroplasty Left 03/26/2015    Procedure: ARTHROPLASTY BIPOLAR HIP (HEMIARTHROPLASTY);  Surgeon: Earnestine Leys, MD;  Location: [facility];  Service: Orthopedics;  Laterality: Left;  . Cataract extraction w/phaco Right 05/25/2015    Procedure: CATARACT EXTRACTION PHACO AND INTRAOCULAR LENS PLACEMENT (IOC);  Surgeon: Birder Robson, MD;  Location: [facility];  Service: Ophthalmology;   Laterality: Right;  [place] 00:57 AP% 15.8 CDE 9.02 fluid pack lot # [id number]    Social History  Substance Use Topics  . Smoking status: Former Smoker -- 0.25 packs/day for 40 years    Types: Cigarettes    Quit date: 10/11/2011  . Smokeless tobacco: Never Used     Comment: Quit 4 months ago   . Alcohol Use: No     Medication list has been reviewed and updated.   Physical Exam  Constitutional: She is oriented to person, place, and time. She appears well-developed and well-nourished. No distress.  Neck: Normal range of motion. Neck supple. No thyromegaly present.  Cardiovascular: Normal rate, regular rhythm and normal heart sounds.   Pulses:      Dorsalis pedis pulses are 2+ on the left side.       Posterior tibial pulses are 1+ on the left side.  Pulmonary/Chest: Effort normal. No respiratory distress. She has decreased breath sounds.  Musculoskeletal:       Left hip: She exhibits tenderness.  Lymphadenopathy:    She has no cervical adenopathy.  Neurological: She is alert and oriented to person, place, and time.  Skin:       BP 132/78 mmHg  Pulse 60  Ht 5\' 4"  (1.626 m)  Wt 118 lb 3.2 oz (53.615 kg)  BMI 20.28 kg/m2  Assessment and Plan: 1. Cellulitis of foot Elevate foot while in the chair and bed for pressure on the heel If not significantly improved at the end of therapy return for further evaluation and possible x-ray - cephALEXin (KEFLEX) 500 MG capsule; Take 1 capsule (500 mg total) by mouth 4 (four) times daily.  Dispense: 40 capsule; Refill: 0 - CBC with Differential/Platelet  2. Episodic confusion May be due to episodes of A. Fib with decreased oxygenation Possibly also associated with low blood sugar episodes but the patient has not checked this Will reassess after treatment for infection Return sooner if symptoms worsen  3. Chronic diastolic CHF (congestive heart failure) ([place]) Follow-up with cardiology as planned in February  4. Paroxysmal atrial  fibrillation (H[facility]) On pacing on the metoprolol; appears to be in this rhythm at this time Gus need for ongoing anticoagulation with Xarelto reduce stroke risk  5. Closed fracture of neck of right femur with routine healing, subsequent encounter Continue to use a walker for safety Finish physical therapy; consider referral to physical therapy for balance training if needed   Halina Maidens, MD [facility]  06/25/2015

## 2015-06-26 LAB — CBC WITH DIFFERENTIAL/PLATELET
BASOS ABS: 0 10*3/uL (ref 0.0–0.2)
Basos: 0 %
EOS (ABSOLUTE): 0 10*3/uL (ref 0.0–0.4)
Eos: 1 %
HEMOGLOBIN: 12.6 g/dL (ref 11.1–15.9)
Hematocrit: 39.3 % (ref 34.0–46.6)
IMMATURE GRANS (ABS): 0.1 10*3/uL (ref 0.0–0.1)
Immature Granulocytes: 1 %
LYMPHS: 16 %
Lymphocytes Absolute: 1.3 10*3/uL (ref 0.7–3.1)
MCH: 29.4 pg (ref 26.6–33.0)
MCHC: 32.1 g/dL (ref 31.5–35.7)
MCV: 92 fL (ref 79–97)
MONOCYTES: 9 %
Monocytes Absolute: 0.7 10*3/uL (ref 0.1–0.9)
NEUTROS ABS: 6.3 10*3/uL (ref 1.4–7.0)
Neutrophils: 73 %
Platelets: 235 10*3/uL (ref 150–379)
RBC: 4.28 x10E6/uL (ref 3.77–5.28)
RDW: 13.8 % (ref 12.3–15.4)
WBC: 8.5 10*3/uL (ref 3.4–10.8)

## 2015-06-29 ENCOUNTER — Ambulatory Visit: Payer: Medicare Other | Admitting: Internal Medicine

## 2015-06-29 ENCOUNTER — Telehealth: Payer: Self-pay

## 2015-06-29 DIAGNOSIS — R2689 Other abnormalities of gait and mobility: Secondary | ICD-10-CM | POA: Diagnosis not present

## 2015-06-29 DIAGNOSIS — M25552 Pain in left hip: Secondary | ICD-10-CM | POA: Diagnosis not present

## 2015-06-29 NOTE — Telephone Encounter (Signed)
Spoke with pt. Pt. Advised of results and verbalized understanding. [facility]

## 2015-06-29 NOTE — Telephone Encounter (Signed)
-----   Message from Glean Hess, MD sent at 06/26/2015  8:22 PM EST ----- Anemia has resolved since taking iron.  WBC is normal indicating no widespread infection.

## 2015-07-01 ENCOUNTER — Encounter: Payer: Self-pay | Admitting: Emergency Medicine

## 2015-07-01 ENCOUNTER — Emergency Department: Payer: Medicare Other

## 2015-07-01 ENCOUNTER — Observation Stay
Admission: EM | Admit: 2015-07-01 | Discharge: 2015-07-03 | Disposition: A | Discharge status: Home / Self Care | Payer: Medicare Other | Attending: Internal Medicine | Admitting: Internal Medicine

## 2015-07-01 ENCOUNTER — Observation Stay: Payer: Medicare Other

## 2015-07-01 DIAGNOSIS — Z743 Need for continuous supervision: Secondary | ICD-10-CM | POA: Diagnosis not present

## 2015-07-01 DIAGNOSIS — I5032 Chronic diastolic (congestive) heart failure: Secondary | ICD-10-CM | POA: Insufficient documentation

## 2015-07-01 DIAGNOSIS — Z8042 Family history of malignant neoplasm of prostate: Secondary | ICD-10-CM | POA: Diagnosis not present

## 2015-07-01 DIAGNOSIS — Z880 Allergy status to penicillin: Secondary | ICD-10-CM | POA: Diagnosis not present

## 2015-07-01 DIAGNOSIS — I639 Cerebral infarction, unspecified: Secondary | ICD-10-CM | POA: Diagnosis present

## 2015-07-01 DIAGNOSIS — M329 Systemic lupus erythematosus, unspecified: Secondary | ICD-10-CM | POA: Diagnosis not present

## 2015-07-01 DIAGNOSIS — Z8249 Family history of ischemic heart disease and other diseases of the circulatory system: Secondary | ICD-10-CM | POA: Insufficient documentation

## 2015-07-01 DIAGNOSIS — R4182 Altered mental status, unspecified: Secondary | ICD-10-CM | POA: Diagnosis not present

## 2015-07-01 DIAGNOSIS — E039 Hypothyroidism, unspecified: Secondary | ICD-10-CM | POA: Diagnosis not present

## 2015-07-01 DIAGNOSIS — R29898 Other symptoms and signs involving the musculoskeletal system: Secondary | ICD-10-CM | POA: Diagnosis not present

## 2015-07-01 DIAGNOSIS — Z961 Presence of intraocular lens: Secondary | ICD-10-CM | POA: Insufficient documentation

## 2015-07-01 DIAGNOSIS — M459 Ankylosing spondylitis of unspecified sites in spine: Secondary | ICD-10-CM | POA: Insufficient documentation

## 2015-07-01 DIAGNOSIS — K589 Irritable bowel syndrome without diarrhea: Secondary | ICD-10-CM | POA: Diagnosis not present

## 2015-07-01 DIAGNOSIS — R339 Retention of urine, unspecified: Secondary | ICD-10-CM | POA: Insufficient documentation

## 2015-07-01 DIAGNOSIS — R482 Apraxia: Secondary | ICD-10-CM | POA: Diagnosis present

## 2015-07-01 DIAGNOSIS — E1140 Type 2 diabetes mellitus with diabetic neuropathy, unspecified: Secondary | ICD-10-CM | POA: Diagnosis not present

## 2015-07-01 DIAGNOSIS — G4700 Insomnia, unspecified: Secondary | ICD-10-CM | POA: Diagnosis not present

## 2015-07-01 DIAGNOSIS — Z9842 Cataract extraction status, left eye: Secondary | ICD-10-CM | POA: Diagnosis not present

## 2015-07-01 DIAGNOSIS — Z91040 Latex allergy status: Secondary | ICD-10-CM | POA: Diagnosis not present

## 2015-07-01 DIAGNOSIS — I739 Peripheral vascular disease, unspecified: Secondary | ICD-10-CM | POA: Diagnosis not present

## 2015-07-01 DIAGNOSIS — Z803 Family history of malignant neoplasm of breast: Secondary | ICD-10-CM | POA: Insufficient documentation

## 2015-07-01 DIAGNOSIS — Z885 Allergy status to narcotic agent status: Secondary | ICD-10-CM | POA: Insufficient documentation

## 2015-07-01 DIAGNOSIS — Z9841 Cataract extraction status, right eye: Secondary | ICD-10-CM | POA: Insufficient documentation

## 2015-07-01 DIAGNOSIS — J449 Chronic obstructive pulmonary disease, unspecified: Secondary | ICD-10-CM | POA: Diagnosis not present

## 2015-07-01 DIAGNOSIS — R262 Difficulty in walking, not elsewhere classified: Secondary | ICD-10-CM | POA: Diagnosis not present

## 2015-07-01 DIAGNOSIS — M4306 Spondylolysis, lumbar region: Secondary | ICD-10-CM | POA: Diagnosis not present

## 2015-07-01 DIAGNOSIS — Z8349 Family history of other endocrine, nutritional and metabolic diseases: Secondary | ICD-10-CM | POA: Diagnosis not present

## 2015-07-01 DIAGNOSIS — R410 Disorientation, unspecified: Secondary | ICD-10-CM | POA: Diagnosis not present

## 2015-07-01 DIAGNOSIS — Z882 Allergy status to sulfonamides status: Secondary | ICD-10-CM | POA: Insufficient documentation

## 2015-07-01 DIAGNOSIS — E440 Moderate protein-calorie malnutrition: Secondary | ICD-10-CM | POA: Diagnosis not present

## 2015-07-01 DIAGNOSIS — K219 Gastro-esophageal reflux disease without esophagitis: Secondary | ICD-10-CM | POA: Diagnosis not present

## 2015-07-01 DIAGNOSIS — Z7901 Long term (current) use of anticoagulants: Secondary | ICD-10-CM | POA: Insufficient documentation

## 2015-07-01 DIAGNOSIS — I480 Paroxysmal atrial fibrillation: Secondary | ICD-10-CM | POA: Diagnosis not present

## 2015-07-01 DIAGNOSIS — Z833 Family history of diabetes mellitus: Secondary | ICD-10-CM | POA: Diagnosis not present

## 2015-07-01 DIAGNOSIS — F0150 Vascular dementia without behavioral disturbance: Secondary | ICD-10-CM | POA: Insufficient documentation

## 2015-07-01 DIAGNOSIS — R413 Other amnesia: Secondary | ICD-10-CM | POA: Diagnosis not present

## 2015-07-01 DIAGNOSIS — I69311 Memory deficit following cerebral infarction: Secondary | ICD-10-CM | POA: Insufficient documentation

## 2015-07-01 DIAGNOSIS — Z9889 Other specified postprocedural states: Secondary | ICD-10-CM | POA: Insufficient documentation

## 2015-07-01 DIAGNOSIS — R9401 Abnormal electroencephalogram [EEG]: Secondary | ICD-10-CM | POA: Diagnosis not present

## 2015-07-01 DIAGNOSIS — R52 Pain, unspecified: Secondary | ICD-10-CM

## 2015-07-01 DIAGNOSIS — I7300 Raynaud's syndrome without gangrene: Secondary | ICD-10-CM | POA: Insufficient documentation

## 2015-07-01 DIAGNOSIS — Z87442 Personal history of urinary calculi: Secondary | ICD-10-CM | POA: Insufficient documentation

## 2015-07-01 DIAGNOSIS — G9340 Encephalopathy, unspecified: Secondary | ICD-10-CM | POA: Diagnosis not present

## 2015-07-01 DIAGNOSIS — R531 Weakness: Secondary | ICD-10-CM | POA: Diagnosis not present

## 2015-07-01 DIAGNOSIS — Z87891 Personal history of nicotine dependence: Secondary | ICD-10-CM | POA: Diagnosis not present

## 2015-07-01 DIAGNOSIS — R05 Cough: Secondary | ICD-10-CM | POA: Insufficient documentation

## 2015-07-01 DIAGNOSIS — M3219 Other organ or system involvement in systemic lupus erythematosus: Secondary | ICD-10-CM | POA: Diagnosis not present

## 2015-07-01 LAB — CBC WITH DIFFERENTIAL/PLATELET
BASOS ABS: 0 10*3/uL (ref 0–0.1)
Basophils Relative: 1 %
EOS ABS: 0.1 10*3/uL (ref 0–0.7)
EOS PCT: 1 %
HCT: 39.7 % (ref 35.0–47.0)
HEMOGLOBIN: 12.7 g/dL (ref 12.0–16.0)
Lymphocytes Relative: 10 %
Lymphs Abs: 1 10*3/uL (ref 1.0–3.6)
MCH: 28.3 pg (ref 26.0–34.0)
MCHC: 32 g/dL (ref 32.0–36.0)
MCV: 88.6 fL (ref 80.0–100.0)
MONOS PCT: 9 %
Monocytes Absolute: 0.9 10*3/uL (ref 0.2–0.9)
NEUTROS PCT: 79 %
Neutro Abs: 8.4 10*3/uL — ABNORMAL HIGH (ref 1.4–6.5)
PLATELETS: 208 10*3/uL (ref 150–440)
RBC: 4.48 MIL/uL (ref 3.80–5.20)
RDW: 13.9 % (ref 11.5–14.5)
WBC: 10.4 10*3/uL (ref 3.6–11.0)

## 2015-07-01 LAB — HEMOGLOBIN A1C: HEMOGLOBIN A1C: 7.4 % — AB (ref 4.0–6.0)

## 2015-07-01 LAB — VITAMIN B12: VITAMIN B 12: 537 pg/mL (ref 180–914)

## 2015-07-01 LAB — URINALYSIS COMPLETE WITH MICROSCOPIC (ARMC ONLY)
BACTERIA UA: NONE SEEN
Bilirubin Urine: NEGATIVE
Glucose, UA: NEGATIVE mg/dL
Ketones, ur: NEGATIVE mg/dL
Leukocytes, UA: NEGATIVE
Nitrite: NEGATIVE
PH: 6 (ref 5.0–8.0)
PROTEIN: NEGATIVE mg/dL
Specific Gravity, Urine: 1.019 (ref 1.005–1.030)

## 2015-07-01 LAB — GLUCOSE, CAPILLARY
GLUCOSE-CAPILLARY: 108 mg/dL — AB (ref 65–99)
Glucose-Capillary: 150 mg/dL — ABNORMAL HIGH (ref 65–99)
Glucose-Capillary: 173 mg/dL — ABNORMAL HIGH (ref 65–99)

## 2015-07-01 LAB — COMPREHENSIVE METABOLIC PANEL
ALT: 15 U/L (ref 14–54)
AST: 16 U/L (ref 15–41)
Albumin: 3.7 g/dL (ref 3.5–5.0)
Alkaline Phosphatase: 102 U/L (ref 38–126)
Anion gap: 6 (ref 5–15)
BUN: 19 mg/dL (ref 6–20)
CHLORIDE: 103 mmol/L (ref 101–111)
CO2: 29 mmol/L (ref 22–32)
CREATININE: 0.72 mg/dL (ref 0.44–1.00)
Calcium: 9 mg/dL (ref 8.9–10.3)
GFR calc Af Amer: >60 mL/min (ref >60)
GFR calc non Af Amer: >60 mL/min (ref >60)
GLUCOSE: 194 mg/dL — AB (ref 65–99)
Potassium: 4 mmol/L (ref 3.5–5.1)
SODIUM: 138 mmol/L (ref 135–145)
Total Bilirubin: 0.6 mg/dL (ref 0.3–1.2)
Total Protein: 7.1 g/dL (ref 6.5–8.1)

## 2015-07-01 LAB — TSH: TSH: 1.726 u[IU]/mL (ref 0.350–4.500)

## 2015-07-01 LAB — TROPONIN I: Troponin I: 0.03 ng/mL (ref <0.031)

## 2015-07-01 LAB — SEDIMENTATION RATE: SED RATE: 30 mm/h (ref 0–30)

## 2015-07-01 LAB — RAPID HIV SCREEN (HIV 1/2 AB+AG)
HIV 1/2 Antibodies: NONREACTIVE
HIV-1 P24 ANTIGEN - HIV24: NONREACTIVE

## 2015-07-01 MED ORDER — MOMETASONE FURO-FORMOTEROL FUM 100-5 MCG/ACT IN AERO
2.0000 | INHALATION_SPRAY | Freq: Two times a day (BID) | RESPIRATORY_TRACT | Status: DC
  Administered 2015-07-01 – 2015-07-03 (×4): 2 via RESPIRATORY_TRACT
  Filled 2015-07-01: qty 8.8

## 2015-07-01 MED ORDER — POLYETHYLENE GLYCOL 3350 17 G PO PACK: 17.0000 g | PACK | Freq: Every day | ORAL | Status: DC | PRN

## 2015-07-01 MED ORDER — ALBUTEROL SULFATE HFA 108 (90 BASE) MCG/ACT IN AERS: 1.0000 | INHALATION_SPRAY | RESPIRATORY_TRACT | Status: DC | PRN

## 2015-07-01 MED ORDER — SUCRALFATE 1 G PO TABS
1.0000 g | ORAL_TABLET | Freq: Four times a day (QID) | ORAL | Status: DC
  Filled 2015-07-01 (×4): qty 1

## 2015-07-01 MED ORDER — ALIGN 4 MG PO CAPS: 1.0000 | ORAL_CAPSULE | Freq: Every day | ORAL | Status: DC

## 2015-07-01 MED ORDER — ACETAMINOPHEN 650 MG RE SUPP: 650.0000 mg | Freq: Four times a day (QID) | RECTAL | Status: DC | PRN

## 2015-07-01 MED ORDER — RIVAROXABAN 20 MG PO TABS
20.0000 mg | ORAL_TABLET | Freq: Every day | ORAL | Status: DC
  Administered 2015-07-01 – 2015-07-02 (×2): 20 mg via ORAL
  Filled 2015-07-01 (×2): qty 1

## 2015-07-01 MED ORDER — TIOTROPIUM BROMIDE MONOHYDRATE 18 MCG IN CAPS
18.0000 ug | ORAL_CAPSULE | Freq: Every day | RESPIRATORY_TRACT | Status: DC
  Administered 2015-07-01 – 2015-07-03 (×3): 18 ug via RESPIRATORY_TRACT
  Filled 2015-07-01: qty 5

## 2015-07-01 MED ORDER — RISAQUAD PO CAPS
1.0000 | ORAL_CAPSULE | Freq: Every day | ORAL | Status: DC
  Administered 2015-07-02 – 2015-07-03 (×2): 1 via ORAL
  Filled 2015-07-01 (×2): qty 1

## 2015-07-01 MED ORDER — FAMOTIDINE 20 MG PO TABS
20.0000 mg | ORAL_TABLET | Freq: Every day | ORAL | Status: DC
  Filled 2015-07-01: qty 1

## 2015-07-01 MED ORDER — INSULIN ASPART 100 UNIT/ML ~~LOC~~ SOLN: 0.0000 [IU] | Freq: Every day | SUBCUTANEOUS | Status: DC

## 2015-07-01 MED ORDER — ALBUTEROL SULFATE (2.5 MG/3ML) 0.083% IN NEBU: 2.5000 mg | INHALATION_SOLUTION | RESPIRATORY_TRACT | Status: DC | PRN

## 2015-07-01 MED ORDER — ACETAMINOPHEN 325 MG PO TABS
650.0000 mg | ORAL_TABLET | Freq: Four times a day (QID) | ORAL | Status: DC | PRN
  Administered 2015-07-02 (×2): 325 mg via ORAL
  Filled 2015-07-01 (×2): qty 2
  Filled 2015-07-01: qty 1

## 2015-07-01 MED ORDER — AMIODARONE HCL 200 MG PO TABS
200.0000 mg | ORAL_TABLET | Freq: Every day | ORAL | Status: DC
  Administered 2015-07-01 – 2015-07-03 (×3): 200 mg via ORAL
  Filled 2015-07-01 (×5): qty 1

## 2015-07-01 MED ORDER — INSULIN ASPART 100 UNIT/ML ~~LOC~~ SOLN
0.0000 [IU] | Freq: Three times a day (TID) | SUBCUTANEOUS | Status: DC
  Administered 2015-07-01: 17:00:00 1 [IU] via SUBCUTANEOUS
  Administered 2015-07-02: 5 [IU] via SUBCUTANEOUS
  Administered 2015-07-02 – 2015-07-03 (×3): 2 [IU] via SUBCUTANEOUS
  Administered 2015-07-03: 9 [IU] via SUBCUTANEOUS
  Filled 2015-07-01: qty 9
  Filled 2015-07-01: qty 2
  Filled 2015-07-01: qty 5
  Filled 2015-07-01: qty 2
  Filled 2015-07-01: qty 1
  Filled 2015-07-01: qty 2

## 2015-07-01 NOTE — ED Notes (Signed)
Ems from home for increasing confusion x 2 weeks. Woke up this am unable to walk.

## 2015-07-01 NOTE — ED Notes (Signed)
Pt a/o, vss. oob at bedside with Dr. Darl Householder and me. Pt very unsteady, shuffling steps, right leg falling behind left. No c/o pain.

## 2015-07-01 NOTE — ED Notes (Signed)
Pt with existing pressure sore on back of left heel. Skin intact, crusty, dark center. Pt states that PCP following. Dr. Darl Householder notified.

## 2015-07-01 NOTE — H&P (Signed)
[facility] [place] NAME: [name]    MR#:  [id number]  DATE OF BIRTH:  1936-06-02  DATE OF ADMISSION:  07/01/2015  PRIMARY CARE PHYSICIAN: Halina Maidens, MD   REQUESTING/REFERRING PHYSICIAN: Dr. Darl Householder  CHIEF COMPLAINT:   Chief Complaint  Patient presents with  . Altered Mental Status    HISTORY OF PRESENT ILLNESS:  [name]  is a 80 y.o. female with a known history of COPD not on home O2, lupus, diabetes, hypothyroidism, paroxysmal atrial fibrillation on xaralto presents with increasing confusion and weakness. She has had decreased appetite, fatigue and has been sleeping much of the day, she has had periods of chills with no diaphoresis no fever. The history is provided by her husband. He states that for the past several weeks her mental status has waxed and waned. She has had some hallucinations of people in her home. At times she has been unable to recognize her husband. She has had significant confusion around recent events. She has also become progressively more weak and had at least 5 falls in the past 7 days. She fell twice yesterday due to bilateral leg weakness. This morning upon waking she was on able to bear any weight on her legs. She usually walks with a walker or with his assistance and she was unable to do either. She has a very short shuffling gait which is new. Emergency room evaluation including CT scan and general lab work is normal. She is being admitted for further evaluation of altered mental status and bilateral leg weakness with inability to walk.  PAST MEDICAL HISTORY:   Past Medical History  Diagnosis Date  . DM (diabetes mellitus) ([place])   . Mitral valve prolapse     a. Not noted on 03/2015 Echo.  Marland Kitchen COPD (chronic obstructive pulmonary disease) ([place])   . Palpitations   . Osteoarthritis   . Systemic lupus erythematosus ([place])   . Lumbar spondylolysis   . Raynaud's disease   . Anxiety   . IBS  (irritable bowel syndrome)   . Uveitis, anterior     ????  . Rheumatic fever   . Ankylosing spondylitis ([place])     ? how diagnosed  . Insomnia, unspecified   . Chronic peptic ulcer, unspecified site, without mention of hemorrhage, perforation, or obstruction   . History of kidney stones   . Chronic kidney infection   . Asthma     as child  . Hypothyroidism     mass, U/S 6/21 - nodules  . Motion sickness     car - back seat  . GERD (gastroesophageal reflux disease)   . Wears dentures     full upper, partial lower  . Wears contact lenses   . COPD (chronic obstructive pulmonary disease) ([place])     a.Uses 2l prn - followed by Dr. Raul Del.  . Raynaud disease   . Headache   . Neuropathy ([place])   . Wheezing   . Chronic diastolic CHF (congestive heart failure) ([place])     a. 03/2015 Echo: EF 55-60%, Gr 1 DD.  Marland Kitchen PAF (paroxysmal atrial fibrillation) ([place])     a. 03/2015 in setting of hip Fx->Amio/Xarelo;  b. CHA2DS2VASc= 4.  . Left displaced femoral neck fracture ([place])     a. 03/2015 s/p L hemiarthroplasty.    PAST SURGICAL HISTORY:   Past Surgical History  Procedure Laterality Date  . Esophagogastroduodenoscopy  multiple  . Colonoscopy  multiple  . Total  abdominal hysterectomy w/ bilateral salpingoophorectomy  1990  . Breast biopsy    . Biopsy thyroid    . Abdominal hysterectomy    . Esophagogastroduodenoscopy (egd) with propofol N/A 12/11/2014    Procedure: ESOPHAGOGASTRODUODENOSCOPY (EGD) WITH PROPOFOL;  Surgeon: [name], MD;  Location: [facility];  Service: Endoscopy;  Laterality: N/A;  cytology brushing  . Cataract extraction w/phaco Left 03/23/2015    Procedure: CATARACT EXTRACTION PHACO AND INTRAOCULAR LENS PLACEMENT (IOC);  Surgeon: Birder Robson, MD;  Location: [facility];  Service: Ophthalmology;  Laterality: Left;  [place] 00:51   . Orif hip fracture Left 03/2015  . Hip arthroplasty Left 03/26/2015    Procedure: ARTHROPLASTY BIPOLAR HIP (HEMIARTHROPLASTY);   Surgeon: Earnestine Leys, MD;  Location: [facility];  Service: Orthopedics;  Laterality: Left;  . Cataract extraction w/phaco Right 05/25/2015    Procedure: CATARACT EXTRACTION PHACO AND INTRAOCULAR LENS PLACEMENT (IOC);  Surgeon: Birder Robson, MD;  Location: [facility];  Service: Ophthalmology;  Laterality: Right;  [place] 00:57 AP% 15.8 CDE 9.02 fluid pack lot # [id number]    SOCIAL HISTORY:   Social History  Substance Use Topics  . Smoking status: Former Smoker -- 0.25 packs/day for 40 years    Types: Cigarettes    Quit date: 10/11/2011  . Smokeless tobacco: Never Used     Comment: Quit 4 months ago   . Alcohol Use: No    FAMILY HISTORY:   Family History  Problem Relation Age of Onset  . Inflammatory bowel disease    . Ovarian cancer    . Diabetes    . Heart disease    . Irritable bowel syndrome    . Kidney disease    . Heart disease Mother   . Hypothyroidism Mother   . Diabetes Mother   . Heart attack Mother   . Hypertension Mother   . Heart disease Father   . Heart attack Father   . Breast cancer Sister   . Hypothyroidism Sister   . Diabetes Sister   . Ovarian cancer Sister   . Diabetes Brother   . Prostate cancer Brother   . Hypothyroidism Daughter     DRUG ALLERGIES:   Allergies  Allergen Reactions  . Codeine Nausea And Vomiting  . Penicillins Hives  . Sulfa Antibiotics   . Doxycycline Rash  . Erythromycin Rash  . Latex Rash    REVIEW OF SYSTEMS:   Review of Systems  Constitutional: Positive for chills and malaise/fatigue. Negative for fever and weight loss.  HENT: Negative for congestion and hearing loss.   Eyes: Negative for blurred vision and pain.  Respiratory: Negative for cough, hemoptysis, sputum production, shortness of breath and stridor.   Cardiovascular: Negative for chest pain, palpitations, orthopnea and leg swelling.  Gastrointestinal: Negative for nausea, vomiting, abdominal pain, diarrhea, constipation and blood in stool.   Genitourinary: Negative for dysuria and frequency.  Musculoskeletal: Negative for myalgias, back pain, joint pain and neck pain.  Skin: Negative for rash.  Neurological: Positive for focal weakness and weakness. Negative for loss of consciousness and headaches.  Endo/Heme/Allergies: Does not bruise/bleed easily.  Psychiatric/Behavioral: Positive for hallucinations and memory loss. Negative for depression. The patient is not nervous/anxious.     MEDICATIONS AT HOME:   Prior to Admission medications   Medication Sig Start Date End Date Taking? Authorizing Provider  albuterol (PROVENTIL HFA;VENTOLIN HFA) 108 (90 BASE) MCG/ACT inhaler Inhale into the lungs 4 (four) times daily - after meals and at bedtime.    Yes Historical  Provider, MD  albuterol (PROVENTIL) (2.5 MG/3ML) 0.083% nebulizer solution Take 2.5 mg by nebulization every 6 (six) hours as needed for wheezing or shortness of breath.   Yes Historical Provider, MD  amiodarone (PACERONE) 200 MG tablet Take 1 tablet (200 mg total) by mouth daily. 06/22/15  Yes Reubin Milan, MD  cephALEXin (KEFLEX) 500 MG capsule Take 1 capsule (500 mg total) by mouth 4 (four) times daily. 06/25/15  Yes Reubin Milan, MD  JANUVIA 100 MG tablet Take 0.5 tablets (50 mg total) by mouth daily. 06/22/15  Yes Reubin Milan, MD  mometasone-formoterol (DULERA) 100-5 MCG/ACT AERO Inhale 2 puffs into the lungs 2 (two) times daily.   Yes Historical Provider, MD  Probiotic Product (ALIGN) 4 MG CAPS Take 1 capsule (4 mg total) by mouth daily. 06/22/15  Yes Reubin Milan, MD  ranitidine (ZANTAC) 150 MG tablet Take 1 tablet (150 mg total) by mouth 2 (two) times daily. 06/22/15  Yes Reubin Milan, MD  rivaroxaban (XARELTO) 20 MG TABS tablet Take 1 tablet (20 mg total) by mouth daily. 06/22/15  Yes Reubin Milan, MD  sucralfate (CARAFATE) 1 G tablet Take 1 g by mouth 4 (four) times daily.   Yes Historical Provider, MD  tiotropium (SPIRIVA) 18 MCG inhalation  capsule Place 1 capsule (18 mcg total) into inhaler and inhale daily. 06/22/15  Yes Reubin Milan, MD      VITAL SIGNS:  Blood pressure 153/68, pulse 53, temperature 97.7 F (36.5 C), temperature source Oral, resp. rate 17, weight 56.7 kg (125 lb), SpO2 97 %.  PHYSICAL EXAMINATION:  GENERAL:  80 y.o.-year-old patient lying in the bed with no acute distress.  EYES: Pupils equal, round, reactive to light and accommodation. No scleral icterus. Extraocular muscles intact.  HEENT: Head atraumatic, normocephalic. Oropharynx and nasopharynx clear. Mucous membranes are dry, good dentition NECK:  Supple, no jugular venous distention. No thyroid enlargement, no tenderness.  LUNGS: Normal breath sounds bilaterally, no wheezing, rales,rhonchi or crepitation. No use of accessory muscles of respiration.  CARDIOVASCULAR: S1, S2 normal. No murmurs, rubs, or gallops.  ABDOMEN: Soft, nontender, nondistended. Bowel sounds present. No organomegaly or mass. No guarding no rebound EXTREMITIES: No pedal edema, cyanosis, or clubbing. Pulses are 1+ NEUROLOGIC: Cranial nerves II through XII are grossly intact. Muscle strength 5/5 in all extremities with the exception of the hip flexors which are 4 out of 5. Sensation intact. Gait not checked -reported to be shuffling by EDP  PSYCHIATRIC: The patient is alert and oriented x 3, does have confusion and poor short-term memory SKIN: 1 x 2 cm darkened area over the left heel  LABORATORY PANEL:   CBC  Recent Labs Lab 07/01/15 0915  WBC 10.4  HGB 12.7  HCT 39.7  PLT 208   ------------------------------------------------------------------------------------------------------------------  Chemistries   Recent Labs Lab 07/01/15 0915  NA 138  K 4.0  CL 103  CO2 29  GLUCOSE 194*  BUN 19  CREATININE 0.72  CALCIUM 9.0  AST 16  ALT 15  ALKPHOS 102  BILITOT 0.6    ------------------------------------------------------------------------------------------------------------------  Cardiac Enzymes  Recent Labs Lab 07/01/15 0915  TROPONINI 0.03   ------------------------------------------------------------------------------------------------------------------  RADIOLOGY:  Dg Chest 2 View  07/01/2015  CLINICAL DATA:  Pain following fall.  Cough. EXAM: CHEST  2 VIEW COMPARISON:  Chest CT November 12, 2014; chest radiograph March 25, 2015 FINDINGS: There is no edema or consolidation. The heart size and pulmonary vascularity are normal. No adenopathy. No pneumothorax. There  is degenerative change in the thoracic spine. IMPRESSION: No edema or consolidation. Electronically Signed   By: Lowella Grip III M.D.   On: 07/01/2015 09:52   Ct Head Wo Contrast  07/01/2015  CLINICAL DATA:  Altered mental status. Increasing confusion for 2 weeks. Unable to walk this morning. EXAM: CT HEAD WITHOUT CONTRAST TECHNIQUE: Contiguous axial images were obtained from the base of the skull through the vertex without intravenous contrast. COMPARISON:  None. FINDINGS: There is no evidence of acute cortical infarct, intracranial hemorrhage, mass, midline shift, or extra-axial fluid collection. There is mild generalized cerebral atrophy. A 2.2 x 1.3 cm oval extra-axial CSF density collection anterior to the left frontal lobe may represent a small arachnoid cyst. Patchy hypodensities in the periventricular white matter are nonspecific but compatible with mild chronic small vessel ischemic disease. Prior bilateral cataract extraction is noted. Mild mucosal thickening/ trace fluid is noted in the right sphenoid sinus. There is a trace right mastoid effusion. Calcified atherosclerosis is noted at the skullbase. IMPRESSION: 1. No evidence of acute intracranial abnormality. 2. Mild chronic small vessel ischemic disease. Electronically Signed   By: Logan Bores M.D.   On: 07/01/2015 10:05     EKG:   Orders placed or performed during the hospital encounter of 07/01/15  . ED EKG  . ED EKG    IMPRESSION AND PLAN:   1. Altered mental status, hallucination and bilateral leg weakness: CT scan is not suggestive of NPH, though this is in the differential. Will obtain MRI to rule out CVA. Neurology will see the patient later today. No sign of infection UA is negative, chest x-ray negative. Of note her husband reports that there is a strong family history of rapidly progressive dementia in her sister and her recent symptoms are very similar to her sister's course. Will check B12, TSH, ESR and ANA with reflex. Will have physical therapy evaluation.  2. Atrial fibrillation, paroxysmal: She is in normal sinus rhythm at this time. Continue xarelto, amiodarone  3. COPD: No exacerbation at this time. Continue albuterol as needed, Spiriva, dulera  4. Diabetes mellitus type 2: Check hemoglobin A1c, start sliding scale insulin. Hold Januvia for now  5. 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 Checking ESR and ANA with reflex. She is not currently on any medications for lupus and does not follow with a rheumatologist.  6. GERD: Continue Pepcid  7. Prophylaxis: Continue xarelto  All the records are reviewed and case discussed with ED provider. Management plans discussed with the patient, family including her husband and they are in agreement.  CODE STATUS: Full code, though her husband reports that she would not want long-term life support  TOTAL TIME TAKING CARE OF THIS PATIENT: 55 minutes.  Greater than 50% of time spent in coordination of care and counseling.  Myrtis Ser M.D on 07/01/2015 at 1:06 PM  Between 7am to 6pm - Pager - [phone]  After 6pm go to [email or web address] - password [email or web address] [facility]  Office  [phone]  CC: Primary care physician; Halina Maidens, MD

## 2015-07-01 NOTE — Consult Note (Addendum)
Reason for Consult:Difficulty with gait Referring Physician: Volanda Napoleon  CC: Difficulty with gait  HPI: [name] is an 80 y.o. female with an extensive medical history. Due to dementia most of history obtained from family.  It appears that the patient has had difficulty with memory for some time.  She was having some slow gradual progression until October when she fell and broke her hip.  She required surgery but afterward was not as cognitively intact.  She went to rehab and did reasonably well. About 2 weeks ago had acute worsening when she was unable to recognize her surroundings and was talking out of her head.  This improved somewhat but did not return to baseline.  Patient had been having some intermittent problems with gait but on the night of presentation when getting up to go to the bathroom was unable to walk.  It was as if she was unable to figure out what to do with her legs.  Patient was brought in a that time.  Has experienced some intermittent RUE numbness as on an outpatient basis as well.  Has experienced mental status changes while hospitalized.   Patient with a history of atrial fibrillation on Xarelto.    Past Medical History  Diagnosis Date  . DM (diabetes mellitus) ([place])   . Mitral valve prolapse     a. Not noted on 03/2015 Echo.  Marland Kitchen COPD (chronic obstructive pulmonary disease) ([place])   . Palpitations   . Osteoarthritis   . Systemic lupus erythematosus ([place])   . Lumbar spondylolysis   . Raynaud's disease   . Anxiety   . IBS (irritable bowel syndrome)   . Uveitis, anterior     ????  . Rheumatic fever   . Ankylosing spondylitis ([place])     ? how diagnosed  . Insomnia, unspecified   . Chronic peptic ulcer, unspecified site, without mention of hemorrhage, perforation, or obstruction   . History of kidney stones   . Chronic kidney infection   . Asthma     as child  . Hypothyroidism     mass, U/S 6/21 - nodules  . Motion sickness     car - back seat  . GERD  (gastroesophageal reflux disease)   . Wears dentures     full upper, partial lower  . Wears contact lenses   . COPD (chronic obstructive pulmonary disease) ([place])     a.Uses 2l prn - followed by Dr. Raul Del.  . Raynaud disease   . Headache   . Neuropathy ([place])   . Wheezing   . Chronic diastolic CHF (congestive heart failure) ([place])     a. 03/2015 Echo: EF 55-60%, Gr 1 DD.  Marland Kitchen PAF (paroxysmal atrial fibrillation) ([place])     a. 03/2015 in setting of hip Fx->Amio/Xarelo;  b. CHA2DS2VASc= 4.  . Left displaced femoral neck fracture ([place])     a. 03/2015 s/p L hemiarthroplasty.    Past Surgical History  Procedure Laterality Date  . Esophagogastroduodenoscopy  multiple  . Colonoscopy  multiple  . Total abdominal hysterectomy w/ bilateral salpingoophorectomy  1990  . Breast biopsy    . Biopsy thyroid    . Abdominal hysterectomy    . Esophagogastroduodenoscopy (egd) with propofol N/A 12/11/2014    Procedure: ESOPHAGOGASTRODUODENOSCOPY (EGD) WITH PROPOFOL;  Surgeon: [name], MD;  Location: [facility];  Service: Endoscopy;  Laterality: N/A;  cytology brushing  . Cataract extraction w/phaco Left 03/23/2015    Procedure: CATARACT EXTRACTION PHACO AND INTRAOCULAR LENS PLACEMENT (IOC);  Surgeon: Birder Robson, MD;  Location: [facility];  Service: Ophthalmology;  Laterality: Left;  [place] 00:51   . Orif hip fracture Left 03/2015  . Hip arthroplasty Left 03/26/2015    Procedure: ARTHROPLASTY BIPOLAR HIP (HEMIARTHROPLASTY);  Surgeon: Earnestine Leys, MD;  Location: [facility];  Service: Orthopedics;  Laterality: Left;  . Cataract extraction w/phaco Right 05/25/2015    Procedure: CATARACT EXTRACTION PHACO AND INTRAOCULAR LENS PLACEMENT (IOC);  Surgeon: Birder Robson, MD;  Location: [facility];  Service: Ophthalmology;  Laterality: Right;  [place] 00:57 AP% 15.8 CDE 9.02 fluid pack lot # [id number]    Family History  Problem Relation Age of Onset  . Inflammatory bowel disease    . Ovarian cancer     . Diabetes    . Heart disease    . Irritable bowel syndrome    . Kidney disease    . Heart disease Mother   . Hypothyroidism Mother   . Diabetes Mother   . Heart attack Mother   . Hypertension Mother   . Heart disease Father   . Heart attack Father   . Breast cancer Sister   . Hypothyroidism Sister   . Diabetes Sister   . Ovarian cancer Sister   . Diabetes Brother   . Prostate cancer Brother   . Hypothyroidism Daughter     Social History:  reports that she quit smoking about 3 years ago. Her smoking use included Cigarettes. She has a 10 pack-year smoking history. She has never used smokeless tobacco. She reports that she does not drink alcohol or use illicit drugs.  Allergies  Allergen Reactions  . Codeine Nausea And Vomiting  . Penicillins Hives  . Sulfa Antibiotics   . Doxycycline Rash  . Erythromycin Rash  . Latex Rash    Medications:  I have reviewed the patient's current medications. Prior to Admission:  Prescriptions prior to admission  Medication Sig Dispense Refill Last Dose  . albuterol (PROVENTIL HFA;VENTOLIN HFA) 108 (90 BASE) MCG/ACT inhaler Inhale into the lungs 4 (four) times daily - after meals and at bedtime.    prn at prn  . albuterol (PROVENTIL) (2.5 MG/3ML) 0.083% nebulizer solution Take 2.5 mg by nebulization every 6 (six) hours as needed for wheezing or shortness of breath.   prn at prn  . amiodarone (PACERONE) 200 MG tablet Take 1 tablet (200 mg total) by mouth daily. 90 tablet 1 06/30/2015 at Unknown time  . cephALEXin (KEFLEX) 500 MG capsule Take 1 capsule (500 mg total) by mouth 4 (four) times daily. 40 capsule 0 06/30/2015 at Unknown time  . JANUVIA 100 MG tablet Take 0.5 tablets (50 mg total) by mouth daily. 45 tablet 1 06/30/2015 at Unknown time  . mometasone-formoterol (DULERA) 100-5 MCG/ACT AERO Inhale 2 puffs into the lungs 2 (two) times daily.   06/30/2015 at Unknown time  . Probiotic Product (ALIGN) 4 MG CAPS Take 1 capsule (4 mg total) by  mouth daily. 90 capsule 1 06/30/2015 at Unknown time  . ranitidine (ZANTAC) 150 MG tablet Take 1 tablet (150 mg total) by mouth 2 (two) times daily. 180 tablet 1 06/30/2015 at Unknown time  . rivaroxaban (XARELTO) 20 MG TABS tablet Take 1 tablet (20 mg total) by mouth daily. 90 tablet 1 06/30/2015 at Unknown time  . sucralfate (CARAFATE) 1 G tablet Take 1 g by mouth 4 (four) times daily.   06/30/2015 at Unknown time  . tiotropium (SPIRIVA) 18 MCG inhalation capsule Place 1 capsule (18 mcg total) into inhaler and  inhale daily. 90 capsule 1 06/30/2015 at Unknown time   Scheduled: . [START ON 07/02/2015] acidophilus  1 capsule Oral Daily  . amiodarone  200 mg Oral Daily  . famotidine  20 mg Oral QHS  . insulin aspart  0-5 Units Subcutaneous QHS  . insulin aspart  0-9 Units Subcutaneous TID WC  . mometasone-formoterol  2 puff Inhalation BID  . rivaroxaban  20 mg Oral Q supper  . sucralfate  1 g Oral QID  . tiotropium  18 mcg Inhalation Daily    ROS: History obtained from the patient  General ROS: negative for - chills, fatigue, fever, night sweats, weight gain or weight loss Psychological ROS: negative for - behavioral disorder, hallucinations, memory difficulties, mood swings or suicidal ideation Ophthalmic ROS: negative for - blurry vision, double vision, eye pain or loss of vision ENT ROS: negative for - epistaxis, nasal discharge, oral lesions, sore throat, tinnitus or vertigo Allergy and Immunology ROS: negative for - hives or itchy/watery eyes Hematological and Lymphatic ROS: negative for - bleeding problems, bruising or swollen lymph nodes Endocrine ROS: negative for - galactorrhea, hair pattern changes, polydipsia/polyuria or temperature intolerance Respiratory ROS: negative for - cough, hemoptysis, shortness of breath or wheezing Cardiovascular ROS: negative for - chest pain, dyspnea on exertion, edema or irregular heartbeat Gastrointestinal ROS: negative for - abdominal pain, diarrhea,  hematemesis, nausea/vomiting or stool incontinence Genito-Urinary ROS: negative for - dysuria, hematuria, incontinence or urinary frequency/urgency Musculoskeletal ROS: pain in left foot Neurological ROS: as noted in HPI Dermatological ROS: negative for rash and skin lesion changes  Physical Examination: Blood pressure 156/55, pulse 58, temperature 97.6 F (36.4 C), temperature source Oral, resp. rate 20, height '5\' 4"'$  (1.626 m), weight 53.723 kg (118 lb 7 oz), SpO2 99 %.  HEENT-  Normocephalic, no lesions, without obvious abnormality.  Normal external eye and conjunctiva.  Normal TM's bilaterally.  Normal auditory canals and external ears. Normal external nose, mucus membranes and septum.  Normal pharynx. Cardiovascular- S1, S2 normal, pulses palpable throughout   Lungs- chest clear, no wheezing, rales, normal symmetric air entry Abdomen- soft, non-tender; bowel sounds normal; no masses,  no organomegaly Extremities- no edema Lymph-no adenopathy palpable Musculoskeletal-no joint tenderness, deformity or swelling Skin-warm and dry, no hyperpigmentation, vitiligo, or suspicious lesions  Neurological Examination Mental Status: Alert.  Difficulty with memory.  Easily distracted.  Speech fluent without evidence of aphasia.  Able to follow 3 step commands without difficulty. Cranial Nerves: II: Discs flat bilaterally; Visual fields grossly normal, pupils equal, round, reactive to light and accommodation III,IV, VI: ptosis not present, extra-ocular motions intact bilaterally V,VII: smile symmetric, facial light touch sensation normal bilaterally VIII: hearing normal bilaterally IX,X: gag reflex present XI: bilateral shoulder shrug XII: midline tongue extension Motor: Right : Upper extremity   5/5    Left:     Upper extremity   5/5  Lower extremity   5/5     Lower extremity   5/5 Tone and bulk:normal tone throughout; no atrophy noted Sensory: Pinprick and light touch intact throughout,  bilaterally Deep Tendon Reflexes: 2+ in the upper extremities, 1+ at the knees and absent at the ankles.   Plantars: Right: mute   Left: upgoing Cerebellar: normal finger-to-nose and normal heel-to-shin testing bilaterally Gait: not tested due to safety concerns   Laboratory Studies:   Basic Metabolic Panel:  Recent Labs Lab 07/01/15 0915  NA 138  K 4.0  CL 103  CO2 29  GLUCOSE 194*  BUN 19  CREATININE 0.72  CALCIUM 9.0    Liver Function Tests:  Recent Labs Lab 07/01/15 0915  AST 16  ALT 15  ALKPHOS 102  BILITOT 0.6  PROT 7.1  ALBUMIN 3.7   No results for input(s): LIPASE, AMYLASE in the last 168 hours. No results for input(s): AMMONIA in the last 168 hours.  CBC:  Recent Labs Lab 06/25/15 1202 07/01/15 0915  WBC 8.5 10.4  NEUTROABS 6.3 8.4*  HGB  --  12.7  HCT 39.3 39.7  MCV 92 88.6  PLT 235 208    Cardiac Enzymes:  Recent Labs Lab 07/01/15 0915  TROPONINI 0.03    BNP: Invalid input(s): POCBNP  CBG:  Recent Labs Lab 07/01/15 1424  GLUCAP 108*    Microbiology: Results for orders placed or performed in visit on 05/19/15  Microscopic Examination     Status: Abnormal   Collection Time: 05/19/15  2:03 PM  Result Value Ref Range Status   WBC, UA 0-5 0 -  5 /hpf Final   RBC, UA None seen 0 -  2 /hpf Final   Epithelial Cells (non renal) 0-10 0 - 10 /hpf Final   Mucus, UA Present (A) Not Estab. Final   Bacteria, UA Few (A) None seen/Few Final    Coagulation Studies: No results for input(s): LABPROT, INR in the last 72 hours.  Urinalysis:  Recent Labs Lab 07/01/15 0915  COLORURINE YELLOW*  LABSPEC 1.019  PHURINE 6.0  GLUCOSEU NEGATIVE  HGBUR 1+*  BILIRUBINUR NEGATIVE  KETONESUR NEGATIVE  PROTEINUR NEGATIVE  NITRITE NEGATIVE  LEUKOCYTESUR NEGATIVE    Lipid Panel:  No results found for: CHOL, TRIG, HDL, CHOLHDL, VLDL, LDLCALC  HgbA1C:  Lab Results  Component Value Date   HGBA1C 6.9* 03/28/2015    Urine Drug Screen:   No results found for: LABOPIA, COCAINSCRNUR, LABBENZ, AMPHETMU, THCU, LABBARB  Alcohol Level: No results for input(s): ETH in the last 168 hours.   Imaging: Dg Chest 2 View  07/01/2015  CLINICAL DATA:  Pain following fall.  Cough. EXAM: CHEST  2 VIEW COMPARISON:  Chest CT November 12, 2014; chest radiograph March 25, 2015 FINDINGS: There is no edema or consolidation. The heart size and pulmonary vascularity are normal. No adenopathy. No pneumothorax. There is degenerative change in the thoracic spine. IMPRESSION: No edema or consolidation. Electronically Signed   By: Lowella Grip III M.D.   On: 07/01/2015 09:52   Ct Head Wo Contrast  07/01/2015  CLINICAL DATA:  Altered mental status. Increasing confusion for 2 weeks. Unable to walk this morning. EXAM: CT HEAD WITHOUT CONTRAST TECHNIQUE: Contiguous axial images were obtained from the base of the skull through the vertex without intravenous contrast. COMPARISON:  None. FINDINGS: There is no evidence of acute cortical infarct, intracranial hemorrhage, mass, midline shift, or extra-axial fluid collection. There is mild generalized cerebral atrophy. A 2.2 x 1.3 cm oval extra-axial CSF density collection anterior to the left frontal lobe may represent a small arachnoid cyst. Patchy hypodensities in the periventricular white matter are nonspecific but compatible with mild chronic small vessel ischemic disease. Prior bilateral cataract extraction is noted. Mild mucosal thickening/ trace fluid is noted in the right sphenoid sinus. There is a trace right mastoid effusion. Calcified atherosclerosis is noted at the skullbase. IMPRESSION: 1. No evidence of acute intracranial abnormality. 2. Mild chronic small vessel ischemic disease. Electronically Signed   By: Logan Bores M.D.   On: 07/01/2015 10:05     Assessment/Plan: 80 year old female with a  history of dementia that appears to be worsening.  Presented with gait issues and concern for weakness but  examination does not suggest weakness.  Suspect a gait apraxia.  From history am concerned about a multi-infarct dementia that had a precipitous worsening since her hip fracture.  Patient with a history of atrial fibrillation on Xarelto.  Head CT personally reviewed and shows no acute changes.  Further work up recommended. TSH normal.  ESR 30.    Recommendations: 1. EEG 2. B12, RPR 3. MRI of the brain without contrast 4. Agree with continued Xarelto  Alexis Goodell, MD Neurology [phone] 07/01/2015, 3:05 PM

## 2015-07-01 NOTE — ED Provider Notes (Signed)
CSN: [id number]     Arrival date & time 07/01/15  0854 History   First MD Initiated Contact with Patient 07/01/15 0912     Chief Complaint  Patient presents with  . Altered Mental Status     (Consider location/radiation/quality/duration/timing/severity/associated sxs/prior Treatment) The history is provided by the patient.  [name] is a 80 y.o. female hx of DM, COPD, lupus, afib on xarelto here with weakness, ataxia. Patient has been intermittently confused for the last 2-3 weeks. Patient occasionally forgets her husband and forgets about the past that hand. Patient also has been progressively weaker for the last week or so. As per the husband, patient has been falling very often and has been having trouble walking. Yesterday, she felt multiple times with some head injury. This morning, she was noted to have a shuffling gait and has a hard time walking. No history of strokes in the past. Has family history of dementia.   Past Medical History  Diagnosis Date  . DM (diabetes mellitus) ([place])   . Mitral valve prolapse     a. Not noted on 03/2015 Echo.  Marland Kitchen COPD (chronic obstructive pulmonary disease) ([place])   . Palpitations   . Osteoarthritis   . Systemic lupus erythematosus ([place])   . Lumbar spondylolysis   . Raynaud's disease   . Anxiety   . IBS (irritable bowel syndrome)   . Uveitis, anterior     ????  . Rheumatic fever   . Ankylosing spondylitis ([place])     ? how diagnosed  . Insomnia, unspecified   . Chronic peptic ulcer, unspecified site, without mention of hemorrhage, perforation, or obstruction   . History of kidney stones   . Chronic kidney infection   . Asthma     as child  . Hypothyroidism     mass, U/S 6/21 - nodules  . Motion sickness     car - back seat  . GERD (gastroesophageal reflux disease)   . Wears dentures     full upper, partial lower  . Wears contact lenses   . COPD (chronic obstructive pulmonary disease) ([place])     a.Uses 2l prn - followed by  Dr. Raul Del.  . Raynaud disease   . Headache   . Neuropathy ([place])   . Wheezing   . Chronic diastolic CHF (congestive heart failure) ([place])     a. 03/2015 Echo: EF 55-60%, Gr 1 DD.  Marland Kitchen PAF (paroxysmal atrial fibrillation) ([place])     a. 03/2015 in setting of hip Fx->Amio/Xarelo;  b. CHA2DS2VASc= 4.  . Left displaced femoral neck fracture ([place])     a. 03/2015 s/p L hemiarthroplasty.   Past Surgical History  Procedure Laterality Date  . Esophagogastroduodenoscopy  multiple  . Colonoscopy  multiple  . Total abdominal hysterectomy w/ bilateral salpingoophorectomy  1990  . Breast biopsy    . Biopsy thyroid    . Abdominal hysterectomy    . Esophagogastroduodenoscopy (egd) with propofol N/A 12/11/2014    Procedure: ESOPHAGOGASTRODUODENOSCOPY (EGD) WITH PROPOFOL;  Surgeon: [name], MD;  Location: [place];  Service: Endoscopy;  Laterality: N/A;  cytology brushing  . Cataract extraction w/phaco Left 03/23/2015    Procedure: CATARACT EXTRACTION PHACO AND INTRAOCULAR LENS PLACEMENT (IOC);  Surgeon: Birder Robson, MD;  Location: [facility];  Service: Ophthalmology;  Laterality: Left;  [place] 00:51   . Orif hip fracture Left 03/2015  . Hip arthroplasty Left 03/26/2015    Procedure: ARTHROPLASTY BIPOLAR HIP (HEMIARTHROPLASTY);  Surgeon: Earnestine Leys, MD;  Location: [facility];  Service: Orthopedics;  Laterality: Left;  . Cataract extraction w/phaco Right 05/25/2015    Procedure: CATARACT EXTRACTION PHACO AND INTRAOCULAR LENS PLACEMENT (IOC);  Surgeon: Birder Robson, MD;  Location: [facility];  Service: Ophthalmology;  Laterality: Right;  [place] 00:57 AP% 15.8 CDE 9.02 fluid pack lot # [id number]   Family History  Problem Relation Age of Onset  . Inflammatory bowel disease    . Ovarian cancer    . Diabetes    . Heart disease    . Irritable bowel syndrome    . Kidney disease    . Heart disease Mother   . Hypothyroidism Mother   . Diabetes Mother   . Heart attack Mother   . Hypertension  Mother   . Heart disease Father   . Heart attack Father   . Breast cancer Sister   . Hypothyroidism Sister   . Diabetes Sister   . Ovarian cancer Sister   . Diabetes Brother   . Prostate cancer Brother   . Hypothyroidism Daughter    Social History  Substance Use Topics  . Smoking status: Former Smoker -- 0.25 packs/day for 40 years    Types: Cigarettes    Quit date: 10/11/2011  . Smokeless tobacco: Never Used     Comment: Quit 4 months ago   . Alcohol Use: No   OB History    No data available     Review of Systems  Neurological: Positive for weakness.  All other systems reviewed and are negative.     Allergies  Codeine; Penicillins; Sulfa antibiotics; Doxycycline; Erythromycin; and Latex  Home Medications   Prior to Admission medications   Medication Sig Start Date End Date Taking? Authorizing Provider  albuterol (PROVENTIL HFA;VENTOLIN HFA) 108 (90 BASE) MCG/ACT inhaler Inhale into the lungs 4 (four) times daily - after meals and at bedtime.     Historical Provider, MD  albuterol (PROVENTIL) (2.5 MG/3ML) 0.083% nebulizer solution Take 2.5 mg by nebulization every 6 (six) hours as needed for wheezing or shortness of breath.    Historical Provider, MD  amiodarone (PACERONE) 200 MG tablet Take 1 tablet (200 mg total) by mouth daily. 06/22/15   Glean Hess, MD  cephALEXin (KEFLEX) 500 MG capsule Take 1 capsule (500 mg total) by mouth 4 (four) times daily. 06/25/15   Glean Hess, MD  DUREZOL 0.05 % EMUL USE ONE DROP IN OPERATIVE EYE TWICE A DAY. START AFTER SURGERY 03/20/15   Historical Provider, MD  ferrous sulfate 325 (65 FE) MG tablet Reported on 06/25/2015 04/20/15   Historical Provider, MD  ILEVRO 0.3 % ophthalmic suspension USE 1 DROP IN OPERATIVE EYE ONCE A DAY AND ONCE ON MORNING OF SURGERY. BEGINNING 2 DAYS PRIOR 03/20/15   Historical Provider, MD  JANUVIA 100 MG tablet Take 0.5 tablets (50 mg total) by mouth daily. 06/22/15   Glean Hess, MD  LORazepam  (ATIVAN) 1 MG tablet Take 0.5 tablets (0.5 mg total) by mouth 2 (two) times daily as needed. 02/22/15   Glean Hess, MD  methocarbamol (ROBAXIN) 500 MG tablet Take 1 tablet (500 mg total) by mouth every 6 (six) hours as needed for muscle spasms. 04/01/15   Epifanio Lesches, MD  metoprolol tartrate (LOPRESSOR) 25 MG tablet Take 1 tablet (25 mg total) by mouth 2 (two) times daily. Patient not taking: Reported on 06/25/2015 06/22/15   Glean Hess, MD  mometasone-formoterol [facility]) 100-5 MCG/ACT AERO Inhale 2 puffs into the lungs 2 (two)  times daily.    Historical Provider, MD  moxifloxacin (VIGAMOX) 0.5 % ophthalmic solution 1 drop QID.    Historical Provider, MD  Probiotic Product (ALIGN) 4 MG CAPS Take 1 capsule (4 mg total) by mouth daily. 06/22/15   Glean Hess, MD  ranitidine (ZANTAC) 150 MG tablet Take 1 tablet (150 mg total) by mouth 2 (two) times daily. 06/22/15   Glean Hess, MD  rivaroxaban (XARELTO) 20 MG TABS tablet Take 1 tablet (20 mg total) by mouth daily. 06/22/15   Glean Hess, MD  senna (SENOKOT) 8.6 MG TABS tablet Take 1 tablet (8.6 mg total) by mouth 2 (two) times daily. 06/22/15   Glean Hess, MD  sucralfate (CARAFATE) 1 G tablet Take 1 g by mouth 4 (four) times daily.    Historical Provider, MD  tiotropium (SPIRIVA) 18 MCG inhalation capsule Place 1 capsule (18 mcg total) into inhaler and inhale daily. 06/22/15   Glean Hess, MD   BP 171/73 mmHg  Pulse 56  Temp(Src) 97.7 F (36.5 C) (Oral)  Wt 125 lb (56.7 kg)  SpO2 98% Physical Exam  Constitutional:  Weakness   HENT:  Head: Normocephalic.  Mouth/Throat: Oropharynx is clear and moist.  Eyes: Conjunctivae are normal. Pupils are equal, round, and reactive to light.  Neck: Normal range of motion. Neck supple.  Cardiovascular: Normal rate, regular rhythm and normal heart sounds.   Pulmonary/Chest: Effort normal and breath sounds normal. No respiratory distress. She has no wheezes. She has no  rales.  Abdominal: Soft. Bowel sounds are normal. She exhibits no distension. There is no tenderness. There is no rebound.  Musculoskeletal: Normal range of motion. She exhibits no edema or tenderness.  Neurological:  Alert, no obvious facial droop. Nl strength throughout. Shuffling gait   Skin: Skin is warm and dry.  Psychiatric: She has a normal mood and affect. Her behavior is normal. Judgment and thought content normal.  Nursing note and vitals reviewed.   ED Course  Procedures (including critical care time) Labs Review Labs Reviewed  CBC WITH DIFFERENTIAL/PLATELET - Abnormal; Notable for the following:    Neutro Abs 8.4 (*)    All other components within normal limits  COMPREHENSIVE METABOLIC PANEL - Abnormal; Notable for the following:    Glucose, Bld 194 (*)    All other components within normal limits  URINALYSIS COMPLETEWITH MICROSCOPIC ([facility] ONLY) - Abnormal; Notable for the following:    Color, Urine YELLOW (*)    APPearance CLEAR (*)    Hgb urine dipstick 1+ (*)    Squamous Epithelial / LPF 0-5 (*)    All other components within normal limits  TROPONIN I    Imaging Review Dg Chest 2 View  07/01/2015  CLINICAL DATA:  Pain following fall.  Cough. EXAM: CHEST  2 VIEW COMPARISON:  Chest CT November 12, 2014; chest radiograph March 25, 2015 FINDINGS: There is no edema or consolidation. The heart size and pulmonary vascularity are normal. No adenopathy. No pneumothorax. There is degenerative change in the thoracic spine. IMPRESSION: No edema or consolidation. Electronically Signed   By: Lowella Grip III M.D.   On: 07/01/2015 09:52   Ct Head Wo Contrast  07/01/2015  CLINICAL DATA:  Altered mental status. Increasing confusion for 2 weeks. Unable to walk this morning. EXAM: CT HEAD WITHOUT CONTRAST TECHNIQUE: Contiguous axial images were obtained from the base of the skull through the vertex without intravenous contrast. COMPARISON:  None. FINDINGS: There is no evidence of  acute cortical infarct, intracranial hemorrhage, mass, midline shift, or extra-axial fluid collection. There is mild generalized cerebral atrophy. A 2.2 x 1.3 cm oval extra-axial CSF density collection anterior to the left frontal lobe may represent a small arachnoid cyst. Patchy hypodensities in the periventricular white matter are nonspecific but compatible with mild chronic small vessel ischemic disease. Prior bilateral cataract extraction is noted. Mild mucosal thickening/ trace fluid is noted in the right sphenoid sinus. There is a trace right mastoid effusion. Calcified atherosclerosis is noted at the skullbase. IMPRESSION: 1. No evidence of acute intracranial abnormality. 2. Mild chronic small vessel ischemic disease. Electronically Signed   By: Logan Bores M.D.   On: 07/01/2015 10:05   I have personally reviewed and evaluated these images and lab results as part of my medical decision-making.   EKG Interpretation None      MDM   Final diagnoses:  None    [name] is a 80 y.o. female here with weakness. Consider infection vs electrolyte abnormalities vs stroke. Patient has very unsteady gait so likely will need admission.  11:37 AM CT head showed no acute infarct. Labs and UA and CXR unremarkable. Consulted Dr. Doy Mince from neurology. Will admit to hospitalist.     Wandra Arthurs, MD 07/01/15 [phone]

## 2015-07-01 NOTE — Care Management Obs Status (Signed)
[place] NOTIFICATION   Patient Details  Name: Kayelyn Mattia MRN: [id number] Date of Birth: 08-Apr-1936   Medicare Observation Status Notification Given:  Yes    Beau Fanny, RN 07/01/2015, 11:53 AM

## 2015-07-02 ENCOUNTER — Observation Stay: Payer: Medicare Other

## 2015-07-02 DIAGNOSIS — G9340 Encephalopathy, unspecified: Secondary | ICD-10-CM | POA: Diagnosis not present

## 2015-07-02 DIAGNOSIS — F0150 Vascular dementia without behavioral disturbance: Secondary | ICD-10-CM | POA: Diagnosis not present

## 2015-07-02 DIAGNOSIS — R531 Weakness: Secondary | ICD-10-CM | POA: Diagnosis not present

## 2015-07-02 DIAGNOSIS — R29898 Other symptoms and signs involving the musculoskeletal system: Secondary | ICD-10-CM

## 2015-07-02 DIAGNOSIS — J449 Chronic obstructive pulmonary disease, unspecified: Secondary | ICD-10-CM | POA: Diagnosis not present

## 2015-07-02 DIAGNOSIS — R339 Retention of urine, unspecified: Secondary | ICD-10-CM | POA: Diagnosis not present

## 2015-07-02 DIAGNOSIS — S91302A Unspecified open wound, left foot, initial encounter: Secondary | ICD-10-CM | POA: Diagnosis not present

## 2015-07-02 LAB — ENA+DNA/DS+ANTICH+CENTRO+JO...
Centromere Ab Screen: <0.2 AI (ref 0.0–0.9)
Chromatin Ab SerPl-aCnc: 1.1 AI — ABNORMAL HIGH (ref 0.0–0.9)
DS DNA AB: 1 [IU]/mL (ref 0–9)
ENA SM Ab Ser-aCnc: <0.2 AI (ref 0.0–0.9)
SSA (Ro) (ENA) Antibody, IgG: <0.2 AI (ref 0.0–0.9)
SSB (La) (ENA) Antibody, IgG: <0.2 AI (ref 0.0–0.9)

## 2015-07-02 LAB — ANA W/REFLEX IF POSITIVE: Anti Nuclear Antibody(ANA): POSITIVE — AB

## 2015-07-02 LAB — GLUCOSE, CAPILLARY
GLUCOSE-CAPILLARY: 156 mg/dL — AB (ref 65–99)
Glucose-Capillary: 173 mg/dL — ABNORMAL HIGH (ref 65–99)
Glucose-Capillary: 282 mg/dL — ABNORMAL HIGH (ref 65–99)
Glucose-Capillary: 167 mg/dL — ABNORMAL HIGH (ref 65–99)

## 2015-07-02 LAB — RPR: RPR: NONREACTIVE

## 2015-07-02 MED ORDER — ENSURE ENLIVE PO LIQD
237.0000 mL | Freq: Three times a day (TID) | ORAL | Status: DC
  Administered 2015-07-02: 12:00:00 237 mL via ORAL

## 2015-07-02 MED ORDER — ENSURE ENLIVE PO LIQD
237.0000 mL | Freq: Three times a day (TID) | ORAL | Status: DC
  Administered 2015-07-02 – 2015-07-03 (×2): 237 mL via ORAL

## 2015-07-02 NOTE — Progress Notes (Signed)
Subjective: Patient out of bed.  Cognition remains an issue.  Has been able to ambulate with PT and a walker.  No new complaints.  Objective: Current vital signs: BP 137/56 mmHg  Pulse 56  Temp(Src) 97.9 F (36.6 C) (Oral)  Resp 16  Ht 5\' 4"  (1.626 m)  Wt 53.723 kg (118 lb 7 oz)  BMI 20.32 kg/m2  SpO2 100% Vital signs in last 24 hours: Temp:  [97.6 F (36.4 C)-97.9 F (36.6 C)] 97.9 F (36.6 C) (01/20 0510) Pulse Rate:  [55-58] 56 (01/20 0510) Resp:  [16-20] 16 (01/20 0510) BP: (124-156)/(55-66) 137/56 mmHg (01/20 0510) SpO2:  [96 %-100 %] 100 % (01/20 0510) Weight:  [53.723 kg (118 lb 7 oz)-56.7 kg (125 lb)] 56.7 kg (125 lb) (01/19 1609)  Intake/Output from previous day: 01/19 0701 - 01/20 0700 In: 120 [P.O.:120] Out: 900 [Urine:900] Intake/Output this shift:   Nutritional status: Diet Heart Room service appropriate?: Yes; Fluid consistency:: Thin  Neurologic Exam: Mental Status: Alert. Difficulty with memory. Easily distracted. Speech fluent without evidence of aphasia. Able to follow 3 step commands without difficulty. Cranial Nerves: II: Discs flat bilaterally; Visual fields grossly normal, pupils equal, round, reactive to light and accommodation III,IV, VI: ptosis not present, extra-ocular motions intact bilaterally V,VII: smile symmetric, facial light touch sensation normal bilaterally VIII: hearing normal bilaterally IX,X: gag reflex present XI: bilateral shoulder shrug XII: midline tongue extension Motor: Right :Upper extremity 5/5Left: Upper extremity 5/5 Lower extremity 5/5Lower extremity 5/5 Tone and bulk:normal tone throughout; no atrophy noted Sensory: Pinprick and light touch intact throughout, bilaterally Deep Tendon Reflexes: 2+ in the upper extremities, 1+ at the knees and absent at the ankles.  Plantars: Right:  muteLeft: upgoing   Lab Results: Basic Metabolic Panel:  Recent Labs Lab 07/01/15 0915  NA 138  K 4.0  CL 103  CO2 29  GLUCOSE 194*  BUN 19  CREATININE 0.72  CALCIUM 9.0    Liver Function Tests:  Recent Labs Lab 07/01/15 0915  AST 16  ALT 15  ALKPHOS 102  BILITOT 0.6  PROT 7.1  ALBUMIN 3.7   No results for input(s): LIPASE, AMYLASE in the last 168 hours. No results for input(s): AMMONIA in the last 168 hours.  CBC:  Recent Labs Lab 07/01/15 0915  WBC 10.4  NEUTROABS 8.4*  HGB 12.7  HCT 39.7  MCV 88.6  PLT 208    Cardiac Enzymes:  Recent Labs Lab 07/01/15 0915  TROPONINI 0.03    Lipid Panel: No results for input(s): CHOL, TRIG, HDL, CHOLHDL, VLDL, LDLCALC in the last 168 hours.  CBG:  Recent Labs Lab 07/01/15 1424 07/01/15 1656 07/01/15 2130 07/02/15 0727 07/02/15 1156  GLUCAP 108* 150* 173* 173* 282*    Microbiology: Results for orders placed or performed in visit on 05/19/15  Microscopic Examination     Status: Abnormal   Collection Time: 05/19/15  2:03 PM  Result Value Ref Range Status   WBC, UA 0-5 0 -  5 /hpf Final   RBC, UA None seen 0 -  2 /hpf Final   Epithelial Cells (non renal) 0-10 0 - 10 /hpf Final   Mucus, UA Present (A) Not Estab. Final   Bacteria, UA Few (A) None seen/Few Final    Coagulation Studies: No results for input(s): LABPROT, INR in the last 72 hours.  Imaging: Dg Chest 2 View  07/01/2015  CLINICAL DATA:  Pain following fall.  Cough. EXAM: CHEST  2 VIEW COMPARISON:  Chest CT November 12, 2014; chest radiograph March 25, 2015 FINDINGS: There is no edema or consolidation. The heart size and pulmonary vascularity are normal. No adenopathy. No pneumothorax. There is degenerative change in the thoracic spine. IMPRESSION: No edema or consolidation. Electronically Signed   By: Lowella Grip III M.D.   On: 07/01/2015 09:52   Ct Head Wo Contrast  07/01/2015  CLINICAL DATA:  Altered  mental status. Increasing confusion for 2 weeks. Unable to walk this morning. EXAM: CT HEAD WITHOUT CONTRAST TECHNIQUE: Contiguous axial images were obtained from the base of the skull through the vertex without intravenous contrast. COMPARISON:  None. FINDINGS: There is no evidence of acute cortical infarct, intracranial hemorrhage, mass, midline shift, or extra-axial fluid collection. There is mild generalized cerebral atrophy. A 2.2 x 1.3 cm oval extra-axial CSF density collection anterior to the left frontal lobe may represent a small arachnoid cyst. Patchy hypodensities in the periventricular white matter are nonspecific but compatible with mild chronic small vessel ischemic disease. Prior bilateral cataract extraction is noted. Mild mucosal thickening/ trace fluid is noted in the right sphenoid sinus. There is a trace right mastoid effusion. Calcified atherosclerosis is noted at the skullbase. IMPRESSION: 1. No evidence of acute intracranial abnormality. 2. Mild chronic small vessel ischemic disease. Electronically Signed   By: Logan Bores M.D.   On: 07/01/2015 10:05   Mr Brain Wo Contrast  07/01/2015  CLINICAL DATA:  80 year old female with increasing confusion and weakness. Paroxysmal atrial fibrillation. COPD. Initial encounter. EXAM: MRI HEAD WITHOUT CONTRAST TECHNIQUE: Multiplanar, multiecho pulse sequences of the brain and surrounding structures were obtained without intravenous contrast. COMPARISON:  Head CT 0952 hours today. FINDINGS: Cerebral volume is within normal limits for age. No restricted diffusion to suggest acute infarction. No midline shift, mass effect, ventriculomegaly, or acute intracranial hemorrhage. Cervicomedullary junction and pituitary are within normal limits. Negative visualized cervical spine. Major intracranial vascular flow voids are within normal limits. There is a probable 2 cm arachnoid cyst along the left frontal convexity (series 7, image 16) which appears  inconsequential. There is Patchy and confluent cerebral white matter T2 and FLAIR hyperintensity. There is confluent T2 hyperintensity in the pons. No cortical encephalomalacia. Deep gray matter nuclei an the cerebellum are within normal limits for age. Visible internal auditory structures appear normal. Trace right mastoid effusion. Negative paranasal sinuses. Postoperative changes to both globes. Negative orbit and scalp soft tissues. Visualized bone marrow signal is within normal limits. IMPRESSION: 1.  No acute intracranial abnormality. 2. Moderately advanced nonspecific signal changes in the cerebral white matter and pons, most commonly due to chronic small vessel disease. 3. Small inconsequential appearing left frontal convexity arachnoid cyst. Electronically Signed   By: Genevie Ann M.D.   On: 07/01/2015 16:53   Dg Foot 2 Views Left  07/02/2015  CLINICAL DATA:  Nonhealing wound on heel for 4 weeks.  Diabetic. EXAM: LEFT FOOT - 2 VIEW COMPARISON:  None. FINDINGS: No acute bony abnormality. Specifically, no fracture, subluxation, or dislocation. Soft tissues are intact. No radiographic changes of osteomyelitis. IMPRESSION: No acute bony abnormality. Electronically Signed   By: Rolm Baptise M.D.   On: 07/02/2015 09:31    Medications:  I have reviewed the patient's current medications. Scheduled: . acidophilus  1 capsule Oral Daily  . amiodarone  200 mg Oral Daily  . famotidine  20 mg Oral QHS  . feeding supplement (ENSURE ENLIVE)  237 mL Oral TID BM  . insulin aspart  0-5 Units Subcutaneous QHS  .  insulin aspart  0-9 Units Subcutaneous TID WC  . mometasone-formoterol  2 puff Inhalation BID  . rivaroxaban  20 mg Oral Q supper  . sucralfate  1 g Oral QID  . tiotropium  18 mcg Inhalation Daily    Assessment/Plan: Patient with improved gait but continues to have cognitive issues.  MRI of the brain personally reviewed and shows advanced small vessel ischemic changes.  EEG with mild slowing  consistent with dementia.  RPR and HIV negative.  B12 normal at 537.  Continue to suspect MID.  Patient on Xarelto.   Case discussed with daughter at length.  Husband not available.    Recommendations: 1. No further neurologic intervention is recommended at this time.  If further questions arise, please call or page at that time.  Thank you for allowing neurology to participate in the care of this patient.  Alexis Goodell, MD Neurology [phone] 07/02/2015  1:31 PM

## 2015-07-02 NOTE — Procedures (Signed)
ELECTROENCEPHALOGRAM REPORT   Patient: [name]       Room #: 125A-AA EEG No. ID: [id number] Age: 80 y.o.        Sex: female Referring Physician: Mody Report Date:  07/02/2015        Interpreting Physician: Alexis Goodell  History: [name] is an 80 y.o. female with altered mental status  Medications:  Scheduled: . acidophilus  1 capsule Oral Daily  . amiodarone  200 mg Oral Daily  . famotidine  20 mg Oral QHS  . feeding supplement (ENSURE ENLIVE)  237 mL Oral TID BM  . insulin aspart  0-5 Units Subcutaneous QHS  . insulin aspart  0-9 Units Subcutaneous TID WC  . mometasone-formoterol  2 puff Inhalation BID  . rivaroxaban  20 mg Oral Q supper  . sucralfate  1 g Oral QID  . tiotropium  18 mcg Inhalation Daily    Conditions of Recording:  This is a 16 channel EEG carried out with the patient in the awake, drowsy and asleep states.  Description:  The waking background activity consists of a low voltage, symmetrical, fairly well organized, 7-8 Hz theta activity, seen from the parieto-occipital and posterior temporal regions.  Low voltage fast activity, poorly organized, is seen anteriorly and is at times superimposed on more posterior regions.  A mixture of theta and alpha rhythms are seen from the central and temporal regions. The patient drowses with slowing to irregular, low voltage theta and beta activity.   The patient goes in to a light sleep with symmetrical sleep spindles, vertex central sharp transients and irregular slow activity.  Hyperventilation was not performed.  Intermittent photic stimulation was performed but failed to illicit any change in the tracing.     IMPRESSION: This is an abnormal EEG secondary to mild posterior background slowing.  This finding may be seen with a diffuse gray matter disturbance that is etiologically nonspecific, but may include a dementia, among other possibilities.  No epileptiform activity is noted.   Alexis Goodell, MD Neurology [phone] 07/02/2015, 12:21 PM

## 2015-07-02 NOTE — Progress Notes (Signed)
[facility] [place] NAME: [name]    MR#:  [id number]  DATE OF BIRTH:  [birth year]  SUBJECTIVE:    Patient c/o foot pain    REVIEW OF SYSTEMS:    Review of Systems  Constitutional: Negative for fever, chills and malaise/fatigue.  HENT: Negative for sore throat.   Eyes: Negative for blurred vision.  Respiratory: Negative for cough, hemoptysis, shortness of breath and wheezing.   Cardiovascular: Negative for chest pain, palpitations and leg swelling.  Gastrointestinal: Negative for nausea, vomiting, abdominal pain, diarrhea and blood in stool.  Genitourinary: Negative for dysuria.  Musculoskeletal: Positive for joint pain. Negative for back pain.  Neurological: Negative for dizziness, tremors and headaches.  Endo/Heme/Allergies: Does not bruise/bleed easily.  Psychiatric/Behavioral: Positive for memory loss.    Tolerating Diet:yes      DRUG ALLERGIES:   Allergies  Allergen Reactions  . Codeine Nausea And Vomiting  . Penicillins Hives  . Sulfa Antibiotics   . Doxycycline Rash  . Erythromycin Rash  . Latex Rash    VITALS:  Blood pressure 137/56, pulse 56, temperature 97.9 F (36.6 C), temperature source Oral, resp. rate 16, height _0  (1.626 m), weight 53.723 kg (118 lb 7 oz), SpO2 100 %.  PHYSICAL EXAMINATION:   Physical Exam  Constitutional: She is oriented to person, place, and time and well-developed, well-nourished, and in no distress. No distress.  HENT:  Head: Normocephalic.  Eyes: No scleral icterus.  Neck: Normal range of motion. Neck supple. No JVD present. No tracheal deviation present.  Cardiovascular: Normal rate, regular rhythm and normal heart sounds.  Exam reveals no gallop and no friction rub.   No murmur heard. Pulmonary/Chest: Effort normal and breath sounds normal. No respiratory distress. She has no wheezes. She has no rales. She exhibits no tenderness.  Abdominal: Soft. Bowel  sounds are normal. She exhibits no distension and no mass. There is no tenderness. There is no rebound and no guarding.  Musculoskeletal: Normal range of motion. She exhibits no edema.  Neurological: She is alert and oriented to person, place, and time.  Skin: Skin is warm. No rash noted. No erythema.  Psychiatric: Affect and judgment normal.      LABORATORY PANEL:   CBC  Recent Labs Lab 07/01/15 0915  WBC 10.4  HGB 12.7  HCT 39.7  PLT 208   ------------------------------------------------------------------------------------------------------------------  Chemistries   Recent Labs Lab 07/01/15 0915  NA 138  K 4.0  CL 103  CO2 29  GLUCOSE 194*  BUN 19  CREATININE 0.72  CALCIUM 9.0  AST 16  ALT 15  ALKPHOS 102  BILITOT 0.6   ------------------------------------------------------------------------------------------------------------------  Cardiac Enzymes  Recent Labs Lab 07/01/15 0915  TROPONINI 0.03   ------------------------------------------------------------------------------------------------------------------  RADIOLOGY:  Dg Chest 2 View  07/01/2015  CLINICAL DATA:  Pain following fall.  Cough. EXAM: CHEST  2 VIEW COMPARISON:  Chest CT November 12, 2014; chest radiograph March 25, 2015 FINDINGS: There is no edema or consolidation. The heart size and pulmonary vascularity are normal. No adenopathy. No pneumothorax. There is degenerative change in the thoracic spine. IMPRESSION: No edema or consolidation. Electronically Signed   By: Lowella Grip III M.D.   On: 07/01/2015 09:52   Ct Head Wo Contrast  07/01/2015  CLINICAL DATA:  Altered mental status. Increasing confusion for 2 weeks. Unable to walk this morning. EXAM: CT HEAD WITHOUT CONTRAST TECHNIQUE: Contiguous axial images were obtained from the base of the skull through  the vertex without intravenous contrast. COMPARISON:  None. FINDINGS: There is no evidence of acute cortical infarct, intracranial  hemorrhage, mass, midline shift, or extra-axial fluid collection. There is mild generalized cerebral atrophy. A 2.2 x 1.3 cm oval extra-axial CSF density collection anterior to the left frontal lobe may represent a small arachnoid cyst. Patchy hypodensities in the periventricular white matter are nonspecific but compatible with mild chronic small vessel ischemic disease. Prior bilateral cataract extraction is noted. Mild mucosal thickening/ trace fluid is noted in the right sphenoid sinus. There is a trace right mastoid effusion. Calcified atherosclerosis is noted at the skullbase. IMPRESSION: 1. No evidence of acute intracranial abnormality. 2. Mild chronic small vessel ischemic disease. Electronically Signed   By: Logan Bores M.D.   On: 07/01/2015 10:05   Mr Brain Wo Contrast  07/01/2015  CLINICAL DATA:  80 year old female with increasing confusion and weakness. Paroxysmal atrial fibrillation. COPD. Initial encounter. EXAM: MRI HEAD WITHOUT CONTRAST TECHNIQUE: Multiplanar, multiecho pulse sequences of the brain and surrounding structures were obtained without intravenous contrast. COMPARISON:  Head CT 0952 hours today. FINDINGS: Cerebral volume is within normal limits for age. No restricted diffusion to suggest acute infarction. No midline shift, mass effect, ventriculomegaly, or acute intracranial hemorrhage. Cervicomedullary junction and pituitary are within normal limits. Negative visualized cervical spine. Major intracranial vascular flow voids are within normal limits. There is a probable 2 cm arachnoid cyst along the left frontal convexity (series 7, image 16) which appears inconsequential. There is Patchy and confluent cerebral white matter T2 and FLAIR hyperintensity. There is confluent T2 hyperintensity in the pons. No cortical encephalomalacia. Deep gray matter nuclei an the cerebellum are within normal limits for age. Visible internal auditory structures appear normal. Trace right mastoid effusion.  Negative paranasal sinuses. Postoperative changes to both globes. Negative orbit and scalp soft tissues. Visualized bone marrow signal is within normal limits. IMPRESSION: 1.  No acute intracranial abnormality. 2. Moderately advanced nonspecific signal changes in the cerebral white matter and pons, most commonly due to chronic small vessel disease. 3. Small inconsequential appearing left frontal convexity arachnoid cyst. Electronically Signed   By: Genevie Ann M.D.   On: 07/01/2015 16:53   Dg Foot 2 Views Left  07/02/2015  CLINICAL DATA:  Nonhealing wound on heel for 4 weeks.  Diabetic. EXAM: LEFT FOOT - 2 VIEW COMPARISON:  None. FINDINGS: No acute bony abnormality. Specifically, no fracture, subluxation, or dislocation. Soft tissues are intact. No radiographic changes of osteomyelitis. IMPRESSION: No acute bony abnormality. Electronically Signed   By: Rolm Baptise M.D.   On: 07/02/2015 09:31     ASSESSMENT AND PLAN:   80 year old female who presented with altered mental status and bilateral leg weakness.  1. Altered mental status with acute encephalopathy: I suspect the patient must have some cognitive impairment causing her altered mental status. This morning patient does not seem altered at all. After discussing case with patient's daughter to appears the patient is having memory loss at home as well. She has no signs of infection. B12 is within normal limits as is TSH and ESR. ANA is pending. MRI shows no acute stroke. EEG is pending. I will follow-up with neurology consultation as well as physical therapy consultation.  2. Bilateral leg weakness: This is likely gait apraxia. Physical therapy consultation is pending.  3. Atrial fibrillation, paroxysmal: Patient is in normal sinus rhythm. Continue Zaroxolyn and amiodarone. I do not feel that these medications are impairing her mental status.  4. COPD: Patient is  not in exacerbation. Continue inhalers.      Management plans discussed with the  patient and daughter and she is in agreement.  CODE STATUS: Full  TOTAL TIME TAKING CARE OF THIS PATIENT: 30 minutes.     POSSIBLE D/C tomorrow, DEPENDING ON CLINICAL CONDITION.   Cathalina Barcia M.D on 07/02/2015 at 11:55 AM  Between 7am to 6pm - Pager - [phone] After 6pm go to [email or web address] - password [email or web address] [facility]  Office  [phone]  CC: Primary care physician; Halina Maidens, MD  Note: This dictation was prepared with Dragon dictation along with smaller phrase technology. Any transcriptional errors that result from this process are unintentional.

## 2015-07-02 NOTE — Progress Notes (Addendum)
Initial Nutrition Assessment  DOCUMENTATION CODES:   Non-severe (moderate) malnutrition in context of acute illness/injury  INTERVENTION:   Meals and Snacks: Cater to patient preferences Medical Food Supplement Therapy: will recommend Ensure Enlive po TID, each supplement provides 350 kcal and 20 grams of protein and Magic Cup on dinner trays.   NUTRITION DIAGNOSIS:   Malnutrition related to poor appetite, acute illness as evidenced by energy intake < 75% for > 7 days, moderate depletions of muscle mass, mild depletion of muscle mass.  GOAL:   Patient will meet greater than or equal to 90% of their needs  MONITOR:    (Energy Intake, Electrolyte and renal Profile, Anthropometrics)  REASON FOR ASSESSMENT:   Malnutrition Screening Tool    ASSESSMENT:   Pt admitted with lower extremity weakness, AMS with hallucinations and multiple falls. Pt with h/o dementia, Neurology following.  Past Medical History  Diagnosis Date  . DM (diabetes mellitus) ([place])   . Mitral valve prolapse     a. Not noted on 03/2015 Echo.  Marland Kitchen COPD (chronic obstructive pulmonary disease) ([place])   . Palpitations   . Osteoarthritis   . Systemic lupus erythematosus ([place])   . Lumbar spondylolysis   . Raynaud's disease   . Anxiety   . IBS (irritable bowel syndrome)   . Uveitis, anterior     ????  . Rheumatic fever   . Ankylosing spondylitis ([place])     ? how diagnosed  . Insomnia, unspecified   . Chronic peptic ulcer, unspecified site, without mention of hemorrhage, perforation, or obstruction   . History of kidney stones   . Chronic kidney infection   . Asthma     as child  . Hypothyroidism     mass, U/S 6/21 - nodules  . Motion sickness     car - back seat  . GERD (gastroesophageal reflux disease)   . Wears dentures     full upper, partial lower  . Wears contact lenses   . COPD (chronic obstructive pulmonary disease) ([place])     a.Uses 2l prn - followed by Dr. Raul Del.  . Raynaud disease   .  Headache   . Neuropathy ([place])   . Wheezing   . Chronic diastolic CHF (congestive heart failure) ([place])     a. 03/2015 Echo: EF 55-60%, Gr 1 DD.  Marland Kitchen PAF (paroxysmal atrial fibrillation) ([place])     a. 03/2015 in setting of hip Fx->Amio/Xarelo;  b. CHA2DS2VASc= 4.  . Left displaced femoral neck fracture ([place])     a. 03/2015 s/p L hemiarthroplasty.     Diet Order:  Diet Heart Room service appropriate?: Yes; Fluid consistency:: Thin    Current Nutrition: Pt reports eating a good breakfast this am of french toast. 30% of breakfast recorded and 50% of dinner last night.  Food/Nutrition-Related History: Pt's husband reports pt usually eats good breakfast in the morning, but usually does not eat lunch and then 'nibbles' on dinner. Pt reprots having Ensure in the fridge at home and that she was getting regularly at [facility] after last admission but since does not drink regularly. Pt's family reports pt need encouragement to eat.   Scheduled Medications:  . acidophilus  1 capsule Oral Daily  . amiodarone  200 mg Oral Daily  . famotidine  20 mg Oral QHS  . feeding supplement (ENSURE ENLIVE)  237 mL Oral TID BM  . insulin aspart  0-5 Units Subcutaneous QHS  . insulin aspart  0-9 Units Subcutaneous TID WC  .  mometasone-formoterol  2 puff Inhalation BID  . rivaroxaban  20 mg Oral Q supper  . sucralfate  1 g Oral QID  . tiotropium  18 mcg Inhalation Daily     Electrolyte/Renal Profile and Glucose Profile:   Recent Labs Lab 07/01/15 0915  NA 138  K 4.0  CL 103  CO2 29  BUN 19  CREATININE 0.72  CALCIUM 9.0  GLUCOSE 194*   Protein Profile:   Recent Labs Lab 07/01/15 0915  ALBUMIN 3.7    Gastrointestinal Profile: Last BM:  07/02/2015   Nutrition-Focused Physical Exam Findings: Nutrition-Focused physical exam completed. Findings are no fat depletion, mild-moderate muscle depletion, and no edema.     Weight Change: Pt reports weight of 119lbs last week at outpatient MD office.  Per [facility] encounters last admission 126lbs in October 2016 (5.5%weight loss in 2-3 months)   Height:   Ht Readings from Last 1 Encounters:  07/01/15 5\' 4"  (1.626 m)    Weight:   Wt Readings from Last 1 Encounters:  07/01/15 118 lb 7 oz (53.723 kg)    Wt Readings from Last 10 Encounters:  07/01/15 118 lb 7 oz (53.723 kg)  07/01/15 125 lb (56.7 kg)  06/25/15 118 lb 3.2 oz (53.615 kg)  05/19/15 122 lb 3.2 oz (55.43 kg)  04/22/15 128 lb 8 oz (58.287 kg)  04/20/15 126 lb 6.4 oz (57.335 kg)  03/30/15 143 lb (64.864 kg)  03/23/15 123 lb (55.792 kg)  02/24/15 123 lb (55.792 kg)  02/24/15 123 lb 3.2 oz (55.883 kg)    BMI:  Body mass index is 20.32 kg/(m^2).  Estimated Nutritional Needs:   Kcal:  BEE: 1007kcals, TEE: (IF 1.1-1.3)(AF 1.2) 1329-1570kcals  Protein:  59-69g protein (1.1-1.3g/kg)  Fluid:  1343-1658mL of fluid (25-67mL/kg)  EDUCATION NEEDS:   No education needs identified at this time   M[place], RD, LDN Pager [phone] Weekend/On-Call Pager [phone]

## 2015-07-02 NOTE — Care Management (Signed)
Admitted to [facility] under observation status with the diagnosis of Lower extremity weakness. Lives with husband, Saralyn Pilar [phone]). Seen  Dr. Army Melia last Friday. [facility] in the past. [facility] last October x 8 days.  Outpatient physical therapy x 10 session. High back bed in the home. Uses a rolling walker to aid in ambulation. Bedside Commode in the home. Shower rails in the bathroom. 5-6 falls prior to admission. Appetite good-average-poor per husband.  Physical therapy evaluation completed. Recommends home with Home Health and Physical therapy.  Spoke with Mr &  Ms Gumina and daughter at the bedside.  Declines Home Health at this time. Possible discharge to home tomorrow [place] [facility] [phone]

## 2015-07-02 NOTE — Progress Notes (Signed)
Pt has only voided a very small amt, bladder scan result 29ml in bladder, pt states that she hasnt been drinking well, pt encouraged to drink more fluids, states understanding

## 2015-07-02 NOTE — Progress Notes (Signed)
Dr Fritzi Mandes made aware that pt hasnt voided but very very little this shift, made aware of 1500 bladder scan result of 43ml, made aware that pt not drinking a lot, pt encouraged to drink, another bladder scan result at this time 234ml in bladder, per Dr [name] Pronto continue to monitor will pass info to next shift

## 2015-07-02 NOTE — Evaluation (Signed)
Physical Therapy Evaluation Patient Details Name: Lavola Cutcher MRN: [id number] DOB: 08-21-1935 Today's Date: 07/02/2015   History of Present Illness  Pt is a 80 yo female who suffered a R hip fx in October, had been recovering fairly well. Now here with ataxia and lower extremity weakness.    Clinical Impression  Pt with baseline dementia here with recent weakness and discoordination of her LEs.  She apparently had been back to ambulating w/o AD after R hip sx in October but has been weak and having shuffling gait recently.  Family member present during eval and is amazed at how well she is able to ambulate with her walker today.  Pt with safety concerns due to awareness and veering during ambulation, but she actually has good cadence and though she is tired with the effort is able to walk ~75 ft relatively well and safely.      Follow Up Recommendations Home health PT    Equipment Recommendations       Recommendations for Other Services       Precautions / Restrictions Precautions Precautions: Fall Restrictions Weight Bearing Restrictions: No      Mobility  Bed Mobility Overal bed mobility: Needs Assistance Bed Mobility: Supine to Sit;Sit to Supine     Supine to sit: Min guard Sit to supine: Mod assist   General bed mobility comments: Pt shows good effort getting up to EOB, needs assist getting back into bed.   Transfers Overall transfer level: Needs assistance Equipment used: Rolling walker (2 wheeled) Transfers: Sit to/from Stand Sit to Stand: Min guard         General transfer comment: Pt is able to rise w/o direct assist, needs VCs for sequencing, set up and safety but ultimately does well  Ambulation/Gait Ambulation/Gait assistance: Min guard Ambulation Distance (Feet): 75 Feet Assistive device: Rolling walker (2 wheeled)       General Gait Details: Pt is able to ambulate with surpsisingly efficient cadence (apparently she has had very shuffling  gait recently).  Pt has some veering with the walker and shows poor safety awareness but physically she did well.   Stairs            Wheelchair Mobility    Modified Rankin (Stroke Patients Only)       Balance                                             Pertinent Vitals/Pain Pain Assessment: No/denies pain    Home Living Family/patient expects to be discharged to:: Private residence Living Arrangements: Spouse/significant other Available Help at Discharge: Family   Home Access: Stairs to enter Entrance Stairs-Rails: None Entrance Stairs-Number of Steps: 1          Prior Function Level of Independence: Independent with assistive device(s)         Comments: Pt has been able to get back to walking w/o an AD since hip sx and was going to outpatient PT     Hand Dominance        Extremity/Trunk Assessment   Upper Extremity Assessment: Overall WFL for tasks assessed           Lower Extremity Assessment: Overall WFL for tasks assessed         Communication   Communication: No difficulties (pt confused )  Cognition Arousal/Alertness: Awake/alert Behavior During Therapy:  (pt  pleasant but confused) Overall Cognitive Status: History of cognitive impairments - at baseline                      General Comments      Exercises        Assessment/Plan    PT Assessment Patient needs continued PT services  PT Diagnosis Generalized weakness;Difficulty walking   PT Problem List Decreased strength;Decreased range of motion;Decreased balance;Decreased activity tolerance;Decreased mobility;Decreased cognition;Decreased knowledge of use of DME  PT Treatment Interventions Gait training;Balance training;Therapeutic exercise;Therapeutic activities;Stair training;DME instruction   PT Goals (Current goals can be found in the Care Plan section) Acute Rehab PT Goals Patient Stated Goal: pt wants to go home PT Goal Formulation: With  patient/family Time For Goal Achievement: 07/16/15 Potential to Achieve Goals: Fair    Frequency Min 2X/week   Barriers to discharge        Co-evaluation               End of Session Equipment Utilized During Treatment: Gait belt Activity Tolerance:  (mental status) Patient left: with bed alarm set;with family/visitor present Nurse Communication: Mobility status    Functional Assessment Tool Used: clinical judgement Functional Limitation: Mobility: Walking and moving around Mobility: Walking and Moving Around Current Status [phone]): At least 20 percent but less than 40 percent impaired, limited or restricted Mobility: Walking and Moving Around Goal Status [phone]): At least 1 percent but less than 20 percent impaired, limited or restricted    Time: 1151-1222 PT Time Calculation (min) (ACUTE ONLY): 31 min   Charges:   PT Evaluation $PT Eval Moderate Complexity: 1 Procedure     PT G Codes:   PT G-Codes **NOT FOR INPATIENT CLASS** Functional Assessment Tool Used: clinical judgement Functional Limitation: Mobility: Walking and moving around Mobility: Walking and Moving Around Current Status [id number]): At least 20 percent but less than 40 percent impaired, limited or restricted Mobility: Walking and Moving Around Goal Status [phone]): At least 1 percent but less than 20 percent impaired, limited or restricted   Wayne Both, PT, DPT [phone]  Kreg Shropshire 07/02/2015, 3:10 PM

## 2015-07-03 ENCOUNTER — Encounter: Payer: Self-pay | Admitting: Internal Medicine

## 2015-07-03 DIAGNOSIS — R339 Retention of urine, unspecified: Secondary | ICD-10-CM | POA: Diagnosis not present

## 2015-07-03 DIAGNOSIS — J449 Chronic obstructive pulmonary disease, unspecified: Secondary | ICD-10-CM | POA: Diagnosis not present

## 2015-07-03 DIAGNOSIS — R531 Weakness: Secondary | ICD-10-CM | POA: Diagnosis not present

## 2015-07-03 DIAGNOSIS — E440 Moderate protein-calorie malnutrition: Secondary | ICD-10-CM | POA: Insufficient documentation

## 2015-07-03 DIAGNOSIS — R29898 Other symptoms and signs involving the musculoskeletal system: Secondary | ICD-10-CM | POA: Diagnosis not present

## 2015-07-03 DIAGNOSIS — G9340 Encephalopathy, unspecified: Secondary | ICD-10-CM | POA: Diagnosis not present

## 2015-07-03 DIAGNOSIS — F0150 Vascular dementia without behavioral disturbance: Secondary | ICD-10-CM | POA: Insufficient documentation

## 2015-07-03 LAB — GLUCOSE, CAPILLARY
GLUCOSE-CAPILLARY: 194 mg/dL — AB (ref 65–99)
Glucose-Capillary: 351 mg/dL — ABNORMAL HIGH (ref 65–99)

## 2015-07-03 MED ORDER — TAMSULOSIN HCL 0.4 MG PO CAPS
0.4000 mg | ORAL_CAPSULE | Freq: Every day | ORAL | Status: DC
  Administered 2015-07-03: 0.4 mg via ORAL
  Filled 2015-07-03: qty 1

## 2015-07-03 MED ORDER — RISAQUAD PO CAPS: 1.0000 | ORAL_CAPSULE | Freq: Every day | ORAL | Status: DC

## 2015-07-03 MED ORDER — ENSURE ENLIVE PO LIQD: 237.0000 mL | Freq: Three times a day (TID) | ORAL | Status: DC

## 2015-07-03 MED ORDER — TAMSULOSIN HCL 0.4 MG PO CAPS: 0.4000 mg | ORAL_CAPSULE | Freq: Every day | ORAL | Status: DC

## 2015-07-03 NOTE — Discharge Summary (Signed)
[facility] NAME: Taren Laible    MR#:  [id number]  DATE OF BIRTH:  [birth year]  DATE OF ADMISSION:  07/01/2015 ADMITTING PHYSICIAN: Aldean Jewett, MD  DATE OF DISCHARGE: 07/03/2015 PRIMARY CARE PHYSICIAN: Halina Maidens, MD    ADMISSION DIAGNOSIS:  Weakness [R53.1] CVA (cerebral infarction) [I63.9]  DISCHARGE DIAGNOSIS:  Active Problems:   Lower extremity weakness   Malnutrition of moderate degree   SECONDARY DIAGNOSIS:   Past Medical History  Diagnosis Date  . DM (diabetes mellitus) ([place])   . Mitral valve prolapse     a. Not noted on 03/2015 Echo.  Marland Kitchen COPD (chronic obstructive pulmonary disease) ([place])   . Palpitations   . Osteoarthritis   . Systemic lupus erythematosus ([place])   . Lumbar spondylolysis   . Raynaud's disease   . Anxiety   . IBS (irritable bowel syndrome)   . Uveitis, anterior     ????  . Rheumatic fever   . Ankylosing spondylitis ([place])     ? how diagnosed  . Insomnia, unspecified   . Chronic peptic ulcer, unspecified site, without mention of hemorrhage, perforation, or obstruction   . History of kidney stones   . Chronic kidney infection   . Asthma     as child  . Hypothyroidism     mass, U/S 6/21 - nodules  . Motion sickness     car - back seat  . GERD (gastroesophageal reflux disease)   . Wears dentures     full upper, partial lower  . Wears contact lenses   . COPD (chronic obstructive pulmonary disease) ([place])     a.Uses 2l prn - followed by Dr. Raul Del.  . Raynaud disease   . Headache   . Neuropathy ([place])   . Wheezing   . Chronic diastolic CHF (congestive heart failure) ([place])     a. 03/2015 Echo: EF 55-60%, Gr 1 DD.  Marland Kitchen PAF (paroxysmal atrial fibrillation) ([place])     a. 03/2015 in setting of hip Fx->Amio/Xarelo;  b. CHA2DS2VASc= 4.  . Left displaced femoral neck fracture ([place])     a. 03/2015 s/p L hemiarthroplasty.    HOSPITAL COURSE:    80 year old female who  presented with altered mental status and bilateral leg weakness.  1. Altered mental status with acute encephalopathy: This is due to multi-infarct dementia. Patient was seen and evaluated by neurology. She underwent EEG which shows slow waves consistent with dementia and MRI which showed no acute stroke however she does have changes which are consistent with multi-infarct dementia. B12, RPR and TSH were within normal limits.  2. Bilateral leg weakness: This is likely due to gait apraxia. Her gait is actually improved. Physical therapy recommended home with home health care.  3. Atrial fibrillation, paroxysmal: Patient is in normal sinus rhythm. Continue XARELTO and amiodarone.   4. COPD: Patient is not in exacerbation. Continue inhalers.  5. Urinary retention: Patient had over 800 cc and bladder. Foley catheter. Patient will need Foley catheter for one week then we'll have voiding trial. She artery sees a urologist which she'll follow-up within one week. She was also started on Flomax.  6. Moderate malnutrition in the context of acute illness/injury: Continue dietary supplementations as recommended by our nutritionist   DISCHARGE CONDITIONS AND DIET:   Patient is stable for discharge with home health on a heart healthy diet  CONSULTS OBTAINED:  Treatment Team:  Catarina Hartshorn, MD  DRUG ALLERGIES:   Allergies  Allergen Reactions  . Codeine Nausea And Vomiting  . Penicillins Hives  . Sulfa Antibiotics   . Doxycycline Rash  . Erythromycin Rash  . Latex Rash    DISCHARGE MEDICATIONS:   Current Discharge Medication List    START taking these medications   Details  !! acidophilus (RISAQUAD) CAPS capsule Take 1 capsule by mouth daily. Qty: 30 capsule, Refills: 0    feeding supplement, ENSURE ENLIVE, (ENSURE ENLIVE) LIQD Take 237 mLs by mouth 3 (three) times daily between meals. Qty: 237 mL, Refills: 12    tamsulosin (FLOMAX) 0.4 MG CAPS capsule Take 1 capsule (0.4 mg total)  by mouth daily. Qty: 30 capsule, Refills: 0     !! - Potential duplicate medications found. Please discuss with provider.    CONTINUE these medications which have NOT CHANGED   Details  albuterol (PROVENTIL HFA;VENTOLIN HFA) 108 (90 BASE) MCG/ACT inhaler Inhale into the lungs 4 (four) times daily - after meals and at bedtime.     albuterol (PROVENTIL) (2.5 MG/3ML) 0.083% nebulizer solution Take 2.5 mg by nebulization every 6 (six) hours as needed for wheezing or shortness of breath.    amiodarone (PACERONE) 200 MG tablet Take 1 tablet (200 mg total) by mouth daily. Qty: 90 tablet, Refills: 1    JANUVIA 100 MG tablet Take 0.5 tablets (50 mg total) by mouth daily. Qty: 45 tablet, Refills: 1    mometasone-formoterol (DULERA) 100-5 MCG/ACT AERO Inhale 2 puffs into the lungs 2 (two) times daily.    !! Probiotic Product (ALIGN) 4 MG CAPS Take 1 capsule (4 mg total) by mouth daily. Qty: 90 capsule, Refills: 1    ranitidine (ZANTAC) 150 MG tablet Take 1 tablet (150 mg total) by mouth 2 (two) times daily. Qty: 180 tablet, Refills: 1    rivaroxaban (XARELTO) 20 MG TABS tablet Take 1 tablet (20 mg total) by mouth daily. Qty: 90 tablet, Refills: 1    sucralfate (CARAFATE) 1 G tablet Take 1 g by mouth 4 (four) times daily.    tiotropium (SPIRIVA) 18 MCG inhalation capsule Place 1 capsule (18 mcg total) into inhaler and inhale daily. Qty: 90 capsule, Refills: 1     !! - Potential duplicate medications found. Please discuss with provider.    STOP taking these medications     cephALEXin (KEFLEX) 500 MG capsule               Today   CHIEF COMPLAINT:  Patient is doing well this morning. Her memory long-term is actually preserved. For pain is better  VITAL SIGNS:  Blood pressure 141/56, pulse 65, temperature 98.6 F (37 C), temperature source Oral, resp. rate 16, height 5\' 4"  (1.626 m), weight 53.723 kg (118 lb 7 oz), SpO2 100 %.   REVIEW OF SYSTEMS:  Review of Systems   Constitutional: Negative for fever, chills and malaise/fatigue.  HENT: Negative for sore throat.   Eyes: Negative for blurred vision.  Respiratory: Negative for cough, hemoptysis, shortness of breath and wheezing.   Cardiovascular: Negative for chest pain, palpitations and leg swelling.  Gastrointestinal: Negative for nausea, vomiting, abdominal pain, diarrhea and blood in stool.  Genitourinary: Negative for dysuria.  Musculoskeletal: Negative for back pain.  Neurological: Negative for dizziness, tremors and headaches.  Endo/Heme/Allergies: Does not bruise/bleed easily.     PHYSICAL EXAMINATION:  GENERAL:  80 y.o.-year-old patient lying in the bed with no acute distress.  NECK:  Supple, no jugular venous distention. No thyroid enlargement, no tenderness.  LUNGS: Normal breath  sounds bilaterally, no wheezing, rales,rhonchi  No use of accessory muscles of respiration.  CARDIOVASCULAR: S1, S2 normal. No murmurs, rubs, or gallops.  ABDOMEN: Soft, non-tender, non-distended. Bowel sounds present. No organomegaly or mass.  EXTREMITIES: No pedal edema, cyanosis, or clubbing.  PSYCHIATRIC: The patient is alert and oriented x 3.  SKIN: No obvious rash, lesion, or ulcer.   DATA REVIEW:   CBC  Recent Labs Lab 07/01/15 0915  WBC 10.4  HGB 12.7  HCT 39.7  PLT 208    Chemistries   Recent Labs Lab 07/01/15 0915  NA 138  K 4.0  CL 103  CO2 29  GLUCOSE 194*  BUN 19  CREATININE 0.72  CALCIUM 9.0  AST 16  ALT 15  ALKPHOS 102  BILITOT 0.6    Cardiac Enzymes  Recent Labs Lab 07/01/15 0915  TROPONINI 0.03    Microbiology Results  @[email or web address] @  RADIOLOGY:  Dg Chest 2 View  07/01/2015  CLINICAL DATA:  Pain following fall.  Cough. EXAM: CHEST  2 VIEW COMPARISON:  Chest CT November 12, 2014; chest radiograph March 25, 2015 FINDINGS: There is no edema or consolidation. The heart size and pulmonary vascularity are normal. No adenopathy. No pneumothorax. There is degenerative  change in the thoracic spine. IMPRESSION: No edema or consolidation. Electronically Signed   By: Lowella Grip III M.D.   On: 07/01/2015 09:52   Ct Head Wo Contrast  07/01/2015  CLINICAL DATA:  Altered mental status. Increasing confusion for 2 weeks. Unable to walk this morning. EXAM: CT HEAD WITHOUT CONTRAST TECHNIQUE: Contiguous axial images were obtained from the base of the skull through the vertex without intravenous contrast. COMPARISON:  None. FINDINGS: There is no evidence of acute cortical infarct, intracranial hemorrhage, mass, midline shift, or extra-axial fluid collection. There is mild generalized cerebral atrophy. A 2.2 x 1.3 cm oval extra-axial CSF density collection anterior to the left frontal lobe may represent a small arachnoid cyst. Patchy hypodensities in the periventricular white matter are nonspecific but compatible with mild chronic small vessel ischemic disease. Prior bilateral cataract extraction is noted. Mild mucosal thickening/ trace fluid is noted in the right sphenoid sinus. There is a trace right mastoid effusion. Calcified atherosclerosis is noted at the skullbase. IMPRESSION: 1. No evidence of acute intracranial abnormality. 2. Mild chronic small vessel ischemic disease. Electronically Signed   By: Logan Bores M.D.   On: 07/01/2015 10:05   Mr Brain Wo Contrast  07/01/2015  CLINICAL DATA:  80 year old female with increasing confusion and weakness. Paroxysmal atrial fibrillation. COPD. Initial encounter. EXAM: MRI HEAD WITHOUT CONTRAST TECHNIQUE: Multiplanar, multiecho pulse sequences of the brain and surrounding structures were obtained without intravenous contrast. COMPARISON:  Head CT 0952 hours today. FINDINGS: Cerebral volume is within normal limits for age. No restricted diffusion to suggest acute infarction. No midline shift, mass effect, ventriculomegaly, or acute intracranial hemorrhage. Cervicomedullary junction and pituitary are within normal limits. Negative  visualized cervical spine. Major intracranial vascular flow voids are within normal limits. There is a probable 2 cm arachnoid cyst along the left frontal convexity (series 7, image 16) which appears inconsequential. There is Patchy and confluent cerebral white matter T2 and FLAIR hyperintensity. There is confluent T2 hyperintensity in the pons. No cortical encephalomalacia. Deep gray matter nuclei an the cerebellum are within normal limits for age. Visible internal auditory structures appear normal. Trace right mastoid effusion. Negative paranasal sinuses. Postoperative changes to both globes. Negative orbit and scalp soft tissues. Visualized bone marrow signal is within  normal limits. IMPRESSION: 1.  No acute intracranial abnormality. 2. Moderately advanced nonspecific signal changes in the cerebral white matter and pons, most commonly due to chronic small vessel disease. 3. Small inconsequential appearing left frontal convexity arachnoid cyst. Electronically Signed   By: Genevie Ann M.D.   On: 07/01/2015 16:53   Dg Foot 2 Views Left  07/02/2015  CLINICAL DATA:  Nonhealing wound on heel for 4 weeks.  Diabetic. EXAM: LEFT FOOT - 2 VIEW COMPARISON:  None. FINDINGS: No acute bony abnormality. Specifically, no fracture, subluxation, or dislocation. Soft tissues are intact. No radiographic changes of osteomyelitis. IMPRESSION: No acute bony abnormality. Electronically Signed   By: Rolm Baptise M.D.   On: 07/02/2015 09:31      Management plans discussed with the patient and family and they are in agreement. Stable for discharge  Home with home health care  Patient should follow up with PCP in one week neurology in 4 weeks  CODE STATUS:     Code Status Orders        Start     Ordered   07/01/15 1348  Full code   Continuous     07/01/15 1347    Code Status History    Date Active Date Inactive Code Status Order ID Comments User Context   03/26/2015  4:59 PM 04/01/2015  6:34 PM Full Code [id number]   Earnestine Leys, MD Inpatient   03/26/2015  3:21 AM 03/26/2015  4:59 PM Full Code [id number]  Lance Coon, MD Inpatient   03/26/2015  2:35 AM 03/26/2015  3:21 AM Full Code [id number]  Earnestine Leys, MD ED    Advance Directive Documentation        Most Recent Value   Type of Advance Directive  Healthcare Power of Attorney, Living will   Pre-existing out of facility DNR order (yellow form or pink MOST form)     "MOST" Form in Place?        TOTAL TIME TAKING CARE OF THIS PATIENT: 35 minutes.    Note: This dictation was prepared with Dragon dictation along with smaller phrase technology. Any transcriptional errors that result from this process are unintentional.  Jancarlo Biermann M.D on 07/03/2015 at 9:38 AM  Between 7am to 6pm - Pager - [phone] After 6pm go to [email or web address] - password [email or web address] [facility]  Office  [phone]  CC: Primary care physician; Halina Maidens, MD

## 2015-07-03 NOTE — Care Management Note (Signed)
Case Management Note  Patient Details  Name: Lyndie Frack MRN: [id number] Date of Birth: 13-Jun-1935  Subjective/Objective:    Daughter at bedside asking to talk to Dr Benjie Karvonen prior to discharge so that they can decide if the family wants home health or not. Daleen Snook RN will notify Dr Benjie Karvonen.               Action/Plan:   Expected Discharge Date:                  Expected Discharge Plan:     In-House Referral:     Discharge planning Services     Post Acute Care Choice:    Choice offered to:     DME Arranged:    DME Agency:     HH Arranged:    [place] Agency:     Status of Service:     Medicare Important Message Given:    Date Medicare IM Given:    Medicare IM give by:    Date Additional Medicare IM Given:    Additional Medicare Important Message give by:     If discussed at [place] of Stay Meetings, dates discussed:    Additional Comments:  Hoyte Ziebell A, RN 07/03/2015, 9:24 AM

## 2015-07-03 NOTE — Plan of Care (Signed)
MD making rounds. Discharge orders received. Case manager arranging [place]. Unable to schedule appointment due to office being closed. Instructed patient and family to make appointments on Monday, January 23rd, 2016. IV removed. Prescriptions E-Prescribed to Pharmacy. Discharge paperwork provided, explained, signed and witnessed. Education handouts provided to patient. No unanswered questions. Escorted via wheelchair by nursing staff. All belongings sent with patient and family.

## 2015-07-03 NOTE — Care Management Note (Signed)
Case Management Note  Patient Details  Name: [name] MRN: [id number] Date of Birth: 07-02-1935  Subjective/Objective:     Referral for home health RN and PT was called and faxed to Sharmon Revere at [facility]. Provided [name] and her daughter with Malachy Mood Rose's cell phone number in the event that they have any home health or catheter issues over the weekend. [name].                Action/Plan:   Expected Discharge Date:                  Expected Discharge Plan:     In-House Referral:     Discharge planning Services     Post Acute Care Choice:    Choice offered to:     DME Arranged:    DME Agency:     HH Arranged:    [place] Agency:     Status of Service:     Medicare Important Message Given:    Date Medicare IM Given:    Medicare IM give by:    Date Additional Medicare IM Given:    Additional Medicare Important Message give by:     If discussed at [place] of Stay Meetings, dates discussed:    Additional Comments:  Tarahji Ramthun A, RN 07/03/2015, 11:09 AM

## 2015-07-05 DIAGNOSIS — L89620 Pressure ulcer of left heel, unstageable: Secondary | ICD-10-CM | POA: Diagnosis not present

## 2015-07-05 DIAGNOSIS — Z466 Encounter for fitting and adjustment of urinary device: Secondary | ICD-10-CM | POA: Diagnosis not present

## 2015-07-05 DIAGNOSIS — F419 Anxiety disorder, unspecified: Secondary | ICD-10-CM | POA: Diagnosis not present

## 2015-07-05 DIAGNOSIS — E1140 Type 2 diabetes mellitus with diabetic neuropathy, unspecified: Secondary | ICD-10-CM | POA: Diagnosis not present

## 2015-07-05 DIAGNOSIS — F0390 Unspecified dementia without behavioral disturbance: Secondary | ICD-10-CM | POA: Diagnosis not present

## 2015-07-05 DIAGNOSIS — Z87891 Personal history of nicotine dependence: Secondary | ICD-10-CM | POA: Diagnosis not present

## 2015-07-05 DIAGNOSIS — R339 Retention of urine, unspecified: Secondary | ICD-10-CM | POA: Diagnosis not present

## 2015-07-05 DIAGNOSIS — I5032 Chronic diastolic (congestive) heart failure: Secondary | ICD-10-CM | POA: Diagnosis not present

## 2015-07-05 DIAGNOSIS — J449 Chronic obstructive pulmonary disease, unspecified: Secondary | ICD-10-CM | POA: Diagnosis not present

## 2015-07-05 DIAGNOSIS — I480 Paroxysmal atrial fibrillation: Secondary | ICD-10-CM | POA: Diagnosis not present

## 2015-07-05 DIAGNOSIS — M329 Systemic lupus erythematosus, unspecified: Secondary | ICD-10-CM | POA: Diagnosis not present

## 2015-07-05 DIAGNOSIS — Z9981 Dependence on supplemental oxygen: Secondary | ICD-10-CM | POA: Diagnosis not present

## 2015-07-05 DIAGNOSIS — R2689 Other abnormalities of gait and mobility: Secondary | ICD-10-CM | POA: Diagnosis not present

## 2015-07-06 DIAGNOSIS — R339 Retention of urine, unspecified: Secondary | ICD-10-CM | POA: Diagnosis not present

## 2015-07-06 DIAGNOSIS — E1140 Type 2 diabetes mellitus with diabetic neuropathy, unspecified: Secondary | ICD-10-CM | POA: Diagnosis not present

## 2015-07-06 DIAGNOSIS — I5032 Chronic diastolic (congestive) heart failure: Secondary | ICD-10-CM | POA: Diagnosis not present

## 2015-07-06 DIAGNOSIS — L89620 Pressure ulcer of left heel, unstageable: Secondary | ICD-10-CM | POA: Diagnosis not present

## 2015-07-06 DIAGNOSIS — J449 Chronic obstructive pulmonary disease, unspecified: Secondary | ICD-10-CM | POA: Diagnosis not present

## 2015-07-06 DIAGNOSIS — F0390 Unspecified dementia without behavioral disturbance: Secondary | ICD-10-CM | POA: Diagnosis not present

## 2015-07-07 DIAGNOSIS — F0390 Unspecified dementia without behavioral disturbance: Secondary | ICD-10-CM | POA: Diagnosis not present

## 2015-07-07 DIAGNOSIS — I5032 Chronic diastolic (congestive) heart failure: Secondary | ICD-10-CM | POA: Diagnosis not present

## 2015-07-07 DIAGNOSIS — R339 Retention of urine, unspecified: Secondary | ICD-10-CM | POA: Diagnosis not present

## 2015-07-07 DIAGNOSIS — J449 Chronic obstructive pulmonary disease, unspecified: Secondary | ICD-10-CM | POA: Diagnosis not present

## 2015-07-07 DIAGNOSIS — L89620 Pressure ulcer of left heel, unstageable: Secondary | ICD-10-CM | POA: Diagnosis not present

## 2015-07-07 DIAGNOSIS — E1140 Type 2 diabetes mellitus with diabetic neuropathy, unspecified: Secondary | ICD-10-CM | POA: Diagnosis not present

## 2015-07-09 ENCOUNTER — Ambulatory Visit (INDEPENDENT_AMBULATORY_CARE_PROVIDER_SITE_OTHER): Payer: Medicare Other | Admitting: Obstetrics and Gynecology

## 2015-07-09 ENCOUNTER — Encounter: Payer: Self-pay | Admitting: Obstetrics and Gynecology

## 2015-07-09 VITALS — BP 116/55 | HR 55 | Ht 64.0 in | Wt 119.2 lb

## 2015-07-09 DIAGNOSIS — F0390 Unspecified dementia without behavioral disturbance: Secondary | ICD-10-CM | POA: Diagnosis not present

## 2015-07-09 DIAGNOSIS — J449 Chronic obstructive pulmonary disease, unspecified: Secondary | ICD-10-CM | POA: Diagnosis not present

## 2015-07-09 DIAGNOSIS — I639 Cerebral infarction, unspecified: Secondary | ICD-10-CM | POA: Diagnosis not present

## 2015-07-09 DIAGNOSIS — E1140 Type 2 diabetes mellitus with diabetic neuropathy, unspecified: Secondary | ICD-10-CM | POA: Diagnosis not present

## 2015-07-09 DIAGNOSIS — R339 Retention of urine, unspecified: Secondary | ICD-10-CM

## 2015-07-09 DIAGNOSIS — I5032 Chronic diastolic (congestive) heart failure: Secondary | ICD-10-CM | POA: Diagnosis not present

## 2015-07-09 DIAGNOSIS — L89620 Pressure ulcer of left heel, unstageable: Secondary | ICD-10-CM | POA: Diagnosis not present

## 2015-07-09 NOTE — Progress Notes (Signed)
3:22 PM   [name] 1936/05/28 [id number]  Referring provider: Glean Hess, MD [address] [place], [place] [address]  Chief Complaint  Patient presents with  . Hospitalization Follow-up    possible catheter removal    HPI: Patient is a 80 year old female with a history of kidney stones presenting today for yearly follow-up visit. Microscopic hematuria including CT urogram and cystoscopy negative for GU pathology last year.  She was scheduled to have a KUB prior to this visit but did not get it because she recently broke her hip and did not feel that she could go.  Urinary complaints today include urinary urgency, occasional dysuria and intermittent difficulty initiating stream. She does experience occasional right-sided flank pain.  She was previously prescribed vaginal shortening cream but states that she used it for a few weeks and did not feel it wasn't working so she discontinued use. Has not been using vaginal estrogen cream as directed.    Interval History:  Recently admitted 07/03/15 for frequent falls and advancing dementia.   Foley catheter was placed with 800cc initial output.  Willl wait for   PMH: Past Medical History  Diagnosis Date  . DM (diabetes mellitus) ([place])   . Mitral valve prolapse     a. Not noted on 03/2015 Echo.  Marland Kitchen COPD (chronic obstructive pulmonary disease) ([place])   . Palpitations   . Osteoarthritis   . Systemic lupus erythematosus ([place])   . Lumbar spondylolysis   . Raynaud's disease   . Anxiety   . IBS (irritable bowel syndrome)   . Uveitis, anterior     ????  . Rheumatic fever   . Ankylosing spondylitis ([place])     ? how diagnosed  . Insomnia, unspecified   . Chronic peptic ulcer, unspecified site, without mention of hemorrhage, perforation, or obstruction   . History of kidney stones   . Chronic kidney infection   . Asthma     as child  . Hypothyroidism     mass, U/S 6/21 - nodules  . Motion sickness     car -  back seat  . GERD (gastroesophageal reflux disease)   . Wears dentures     full upper, partial lower  . Wears contact lenses   . COPD (chronic obstructive pulmonary disease) ([place])     a.Uses 2l prn - followed by Dr. Raul Del.  . Raynaud disease   . Headache   . Neuropathy ([place])   . Wheezing   . Chronic diastolic CHF (congestive heart failure) ([place])     a. 03/2015 Echo: EF 55-60%, Gr 1 DD.  Marland Kitchen PAF (paroxysmal atrial fibrillation) ([place])     a. 03/2015 in setting of hip Fx->Amio/Xarelo;  b. CHA2DS2VASc= 4.  . Left displaced femoral neck fracture ([place])     a. 03/2015 s/p L hemiarthroplasty.    Surgical History: Past Surgical History  Procedure Laterality Date  . Esophagogastroduodenoscopy  multiple  . Colonoscopy  multiple  . Total abdominal hysterectomy w/ bilateral salpingoophorectomy  1990  . Breast biopsy    . Biopsy thyroid    . Esophagogastroduodenoscopy (egd) with propofol N/A 12/11/2014    Procedure: ESOPHAGOGASTRODUODENOSCOPY (EGD) WITH PROPOFOL;  Surgeon: [name], MD;  Location: [facility];  Service: Endoscopy;  Laterality: N/A;  cytology brushing  . Cataract extraction w/phaco Left 03/23/2015    Procedure: CATARACT EXTRACTION PHACO AND INTRAOCULAR LENS PLACEMENT (IOC);  Surgeon: Birder Robson, MD;  Location: [facility];  Service: Ophthalmology;  Laterality: Left;  [place] 00:51   . Hip arthroplasty Left 03/26/2015    Procedure: ARTHROPLASTY BIPOLAR HIP (HEMIARTHROPLASTY);  Surgeon: Earnestine Leys, MD;  Location: [facility];  Service: Orthopedics;  Laterality: Left;  . Cataract extraction w/phaco Right 05/25/2015    Procedure: CATARACT EXTRACTION PHACO AND INTRAOCULAR LENS PLACEMENT (IOC);  Surgeon: Birder Robson, MD;  Location: [facility];  Service: Ophthalmology;  Laterality: Right;  [place] 00:57     Home Medications:    Medication List       This list is accurate as of: 07/09/15 11:59 PM.  Always use your most recent med list.               albuterol (2.5  MG/3ML) 0.083% nebulizer solution  Commonly known as:  PROVENTIL  Take 2.5 mg by nebulization every 6 (six) hours as needed for wheezing or shortness of breath. Reported on 07/09/2015     albuterol 108 (90 Base) MCG/ACT inhaler  Commonly known as:  PROVENTIL HFA;VENTOLIN HFA  Inhale into the lungs 4 (four) times daily - after meals and at bedtime.     ALIGN 4 MG Caps  Take 1 capsule (4 mg total) by mouth daily.     acidophilus Caps capsule  Take 1 capsule by mouth daily.     amiodarone 200 MG tablet  Commonly known as:  PACERONE  Take 1 tablet (200 mg total) by mouth daily.     cephALEXin 500 MG capsule  Commonly known as:  KEFLEX  TAKE 1 CAPSULE (500 MG TOTAL) BY MOUTH 4 (FOUR) TIMES DAILY.     [employer] SENNA 8.6 MG tablet  Generic drug:  senna  TAKE 1 TABLET (8.6 MG TOTAL) BY MOUTH 2 (TWO) TIMES DAILY.     DULERA 100-5 MCG/ACT Aero  Generic drug:  mometasone-formoterol  Inhale 2 puffs into the lungs 2 (two) times daily.     DUREZOL 0.05 % Emul  Generic drug:  Difluprednate  BEGINNING AFTER SURGERY, USE ONE DROP TWICE A DAY IN OPERATIVE EYE.     feeding supplement (ENSURE ENLIVE) Liqd  Take 237 mLs by mouth 3 (three) times daily between meals.     JANUVIA 100 MG tablet  Generic drug:  sitaGLIPtin  Take 0.5 tablets (50 mg total) by mouth daily.     metoprolol tartrate 25 MG tablet  Commonly known as:  LOPRESSOR  TAKE 1 TABLET (25 MG TOTAL) BY MOUTH 2 (TWO) TIMES DAILY.     ranitidine 150 MG tablet  Commonly known as:  ZANTAC  Take 1 tablet (150 mg total) by mouth 2 (two) times daily.     rivaroxaban 20 MG Tabs tablet  Commonly known as:  XARELTO  Take 1 tablet (20 mg total) by mouth daily.     sucralfate 1 g tablet  Commonly known as:  CARAFATE  Take 1 g by mouth 4 (four) times daily. Reported on 07/12/2015     tamsulosin 0.4 MG Caps capsule  Commonly known as:  FLOMAX  Take 1 capsule (0.4 mg total) by mouth daily.     tiotropium 18 MCG inhalation capsule   Commonly known as:  SPIRIVA  Place 1 capsule (18 mcg total) into inhaler and inhale daily.        Allergies:  Allergies  Allergen Reactions  . Codeine Nausea And Vomiting  . Penicillins Hives  . Sulfa Antibiotics   . Doxycycline Rash  . Erythromycin Rash  . Latex Rash    Family History: Family History  Problem Relation Age of  Onset  . Kidney disease Brother   . Heart disease Mother   . Hypothyroidism Mother   . Diabetes Mother   . Heart attack Mother   . Hypertension Mother   . Heart disease Father   . Heart attack Father   . Breast cancer Sister   . Hypothyroidism Sister   . Diabetes Sister   . Ovarian cancer Sister   . Diabetes Brother   . Prostate cancer Brother   . Hypothyroidism Daughter     Social History:  reports that she quit smoking about 3 years ago. Her smoking use included Cigarettes. She has a 10 pack-year smoking history. She has never used smokeless tobacco. She reports that she does not drink alcohol or use illicit drugs.  ROS: UROLOGY Frequent Urination?: No Hard to postpone urination?: No Burning/pain with urination?: No Get up at night to urinate?: No Leakage of urine?: No Urine stream starts and stops?: No Trouble starting stream?: No Do you have to strain to urinate?: No Blood in urine?: Yes Urinary tract infection?: No Sexually transmitted disease?: No Injury to kidneys or bladder?: No Painful intercourse?: No Weak stream?: No Currently pregnant?: No Vaginal bleeding?: No Last menstrual period?: n  Gastrointestinal Nausea?: No Vomiting?: No Indigestion/heartburn?: No Diarrhea?: No Constipation?: No  Constitutional Fever: No Night sweats?: No Weight loss?: No Fatigue?: Yes  Skin Skin rash/lesions?: No Itching?: No  Eyes Blurred vision?: No Double vision?: No  Ears/Nose/Throat Sore throat?: No Sinus problems?: No  Hematologic/Lymphatic Swollen glands?: No Easy bruising?: Yes  Cardiovascular Leg swelling?:  No Chest pain?: No  Respiratory Cough?: No Shortness of breath?: Yes  Endocrine Excessive thirst?: No  Musculoskeletal Back pain?: No Joint pain?: No  Neurological Headaches?: Yes Dizziness?: No  Psychologic Depression?: No Anxiety?: Yes  Physical Exam: BP 116/55 mmHg  Pulse 55  Ht 5\' 4"  (1.626 m)  Wt 119 lb 3.2 oz (54.069 kg)  BMI 20.45 kg/m2  Constitutional:  Alert and oriented, No acute distress, ambulating using a walker HEENT: [place] AT, moist mucus membranes.  Trachea midline, no masses. Cardiovascular: No clubbing, cyanosis, or edema. Respiratory: Normal respiratory effort, no increased work of breathing. Skin: No rashes, bruises or suspicious lesions. Neurologic: Grossly intact, no focal deficits, moving all 4 extremities. Psychiatric: Normal mood and affect.  Laboratory Data:   Urinalysis    Component Value Date/Time   COLORURINE YELLOW* 07/01/2015 0915   COLORURINE Yellow 07/16/2013 1208   APPEARANCEUR CLEAR* 07/01/2015 0915   APPEARANCEUR Hazy 07/16/2013 1208   LABSPEC 1.019 07/01/2015 0915   LABSPEC 1.028 07/16/2013 1208   PHURINE 6.0 07/01/2015 0915   PHURINE 6.0 07/16/2013 1208   GLUCOSEU NEGATIVE 07/01/2015 0915   GLUCOSEU >=500 07/16/2013 1208   HGBUR 1+* 07/01/2015 0915   HGBUR Negative 07/16/2013 [place] 07/01/2015 0915   BILIRUBINUR Negative 05/19/2015 1403   BILIRUBINUR Negative 07/16/2013 1208   KETONESUR NEGATIVE 07/01/2015 0915   KETONESUR Trace 07/16/2013 1208   PROTEINUR NEGATIVE 07/01/2015 0915   PROTEINUR 30 mg/dL 07/16/2013 1208   NITRITE NEGATIVE 07/01/2015 0915   NITRITE Negative 05/19/2015 1403   NITRITE Negative 07/16/2013 1208   LEUKOCYTESUR NEGATIVE 07/01/2015 0915   LEUKOCYTESUR Negative 05/19/2015 1403   LEUKOCYTESUR 3+ 07/16/2013 1208    Pertinent Imaging:   Assessment & Plan:    1. Urinary frequency/urgency- Foley catheter placed in ED s/p fall.  Voiding trial postponed until early next  week to avoid possible retention over the weekend.  2. Vaginal Atrophy-  Continue  vaginal estrogen cream.  3. Kidney Stones- Patient will obtain her KUB and I will call her with the results.    Return for schedule Monday morning for voiding trial.  These notes generated with voice recognition software. I apologize for typographical errors.  Herbert Moors, [facility] [address] [place], [place] [address] [phone]

## 2015-07-10 ENCOUNTER — Encounter: Payer: Self-pay | Admitting: Internal Medicine

## 2015-07-12 ENCOUNTER — Encounter (INDEPENDENT_AMBULATORY_CARE_PROVIDER_SITE_OTHER): Payer: Medicare Other | Admitting: Internal Medicine

## 2015-07-12 ENCOUNTER — Ambulatory Visit (INDEPENDENT_AMBULATORY_CARE_PROVIDER_SITE_OTHER): Payer: Medicare Other | Admitting: Obstetrics and Gynecology

## 2015-07-12 ENCOUNTER — Encounter: Payer: Self-pay | Admitting: Obstetrics and Gynecology

## 2015-07-12 VITALS — BP 152/67 | HR 82 | Resp 16 | Ht 64.0 in | Wt 124.0 lb

## 2015-07-12 DIAGNOSIS — L89622 Pressure ulcer of left heel, stage 2: Secondary | ICD-10-CM

## 2015-07-12 DIAGNOSIS — R339 Retention of urine, unspecified: Secondary | ICD-10-CM

## 2015-07-12 DIAGNOSIS — F0150 Vascular dementia without behavioral disturbance: Secondary | ICD-10-CM

## 2015-07-12 DIAGNOSIS — I639 Cerebral infarction, unspecified: Secondary | ICD-10-CM | POA: Diagnosis not present

## 2015-07-12 DIAGNOSIS — L89613 Pressure ulcer of right heel, stage 3: Secondary | ICD-10-CM | POA: Insufficient documentation

## 2015-07-12 DIAGNOSIS — J449 Chronic obstructive pulmonary disease, unspecified: Secondary | ICD-10-CM

## 2015-07-12 LAB — BLADDER SCAN AMB NON-IMAGING: Scan Result: 85

## 2015-07-12 NOTE — Progress Notes (Signed)
3:25 PM   [name] 11-21-1935 [id number]  Referring provider: Glean Hess, MD [address] [place], [place] [address]  Chief Complaint  Patient presents with  . Urinary Retention    voiding trial    HPI: Patient is a 80 year old female with a history of kidney stones presenting today for yearly follow-up visit. Microscopic hematuria including CT urogram and cystoscopy negative for GU pathology last year.  She was scheduled to have a KUB prior to this visit but did not get it because she recently broke her hip and did not feel that she could go.  Urinary complaints today include urinary urgency, occasional dysuria and intermittent difficulty initiating stream. She does experience occasional right-sided flank pain.  She was previously prescribed vaginal shortening cream but states that she used it for a few weeks and did not feel it wasn't working so she discontinued use. Has not been using vaginal estrogen cream as directed.    Interval History:  Recently admitted 07/03/15 for frequent falls and advancing dementia.  Developed urinary retention.   Foley catheter was placed with 800cc initial output.  Patient denies any complaints today but is only able to answer simple questions and is unable to provide an accurate history d/t advanced dementia.  PMH: Past Medical History  Diagnosis Date  . DM (diabetes mellitus) ([place])   . Mitral valve prolapse     a. Not noted on 03/2015 Echo.  Marland Kitchen COPD (chronic obstructive pulmonary disease) ([place])   . Palpitations   . Osteoarthritis   . Systemic lupus erythematosus ([place])   . Lumbar spondylolysis   . Raynaud's disease   . Anxiety   . IBS (irritable bowel syndrome)   . Uveitis, anterior     ????  . Rheumatic fever   . Ankylosing spondylitis ([place])     ? how diagnosed  . Insomnia, unspecified   . Chronic peptic ulcer, unspecified site, without mention of hemorrhage, perforation, or obstruction   . History of kidney  stones   . Chronic kidney infection   . Asthma     as child  . Hypothyroidism     mass, U/S 6/21 - nodules  . Motion sickness     car - back seat  . GERD (gastroesophageal reflux disease)   . Wears dentures     full upper, partial lower  . Wears contact lenses   . COPD (chronic obstructive pulmonary disease) ([place])     a.Uses 2l prn - followed by Dr. Raul Del.  . Raynaud disease   . Headache   . Neuropathy ([place])   . Wheezing   . Chronic diastolic CHF (congestive heart failure) ([place])     a. 03/2015 Echo: EF 55-60%, Gr 1 DD.  Marland Kitchen PAF (paroxysmal atrial fibrillation) ([place])     a. 03/2015 in setting of hip Fx->Amio/Xarelo;  b. CHA2DS2VASc= 4.  . Left displaced femoral neck fracture ([place])     a. 03/2015 s/p L hemiarthroplasty.    Surgical History: Past Surgical History  Procedure Laterality Date  . Esophagogastroduodenoscopy  multiple  . Colonoscopy  multiple  . Total abdominal hysterectomy w/ bilateral salpingoophorectomy  1990  . Breast biopsy    . Biopsy thyroid    . Esophagogastroduodenoscopy (egd) with propofol N/A 12/11/2014    Procedure: ESOPHAGOGASTRODUODENOSCOPY (EGD) WITH PROPOFOL;  Surgeon: [name], MD;  Location: [facility];  Service: Endoscopy;  Laterality: N/A;  cytology brushing  . Cataract extraction w/phaco Left 03/23/2015    Procedure: CATARACT  EXTRACTION PHACO AND INTRAOCULAR LENS PLACEMENT (IOC);  Surgeon: Birder Robson, MD;  Location: [facility];  Service: Ophthalmology;  Laterality: Left;  [place] 00:51   . Hip arthroplasty Left 03/26/2015    Procedure: ARTHROPLASTY BIPOLAR HIP (HEMIARTHROPLASTY);  Surgeon: Earnestine Leys, MD;  Location: [facility];  Service: Orthopedics;  Laterality: Left;  . Cataract extraction w/phaco Right 05/25/2015    Procedure: CATARACT EXTRACTION PHACO AND INTRAOCULAR LENS PLACEMENT (IOC);  Surgeon: Birder Robson, MD;  Location: [facility];  Service: Ophthalmology;  Laterality: Right;  [place] 00:57     Home Medications:     Medication List       This list is accurate as of: 07/12/15  3:25 PM.  Always use your most recent med list.               albuterol (2.5 MG/3ML) 0.083% nebulizer solution  Commonly known as:  PROVENTIL  Take 2.5 mg by nebulization every 6 (six) hours as needed for wheezing or shortness of breath. Reported on 07/09/2015     albuterol 108 (90 Base) MCG/ACT inhaler  Commonly known as:  PROVENTIL HFA;VENTOLIN HFA  Inhale into the lungs 4 (four) times daily - after meals and at bedtime.     ALIGN 4 MG Caps  Take 1 capsule (4 mg total) by mouth daily.     acidophilus Caps capsule  Take 1 capsule by mouth daily.     amiodarone 200 MG tablet  Commonly known as:  PACERONE  Take 1 tablet (200 mg total) by mouth daily.     cephALEXin 500 MG capsule  Commonly known as:  KEFLEX  TAKE 1 CAPSULE (500 MG TOTAL) BY MOUTH 4 (FOUR) TIMES DAILY.     CVS SENNA 8.6 MG tablet  Generic drug:  senna  TAKE 1 TABLET (8.6 MG TOTAL) BY MOUTH 2 (TWO) TIMES DAILY.     DULERA 100-5 MCG/ACT Aero  Generic drug:  mometasone-formoterol  Inhale 2 puffs into the lungs 2 (two) times daily.     DUREZOL 0.05 % Emul  Generic drug:  Difluprednate  BEGINNING AFTER SURGERY, USE ONE DROP TWICE A DAY IN OPERATIVE EYE.     feeding supplement (ENSURE ENLIVE) Liqd  Take 237 mLs by mouth 3 (three) times daily between meals.     JANUVIA 100 MG tablet  Generic drug:  sitaGLIPtin  Take 0.5 tablets (50 mg total) by mouth daily.     metoprolol tartrate 25 MG tablet  Commonly known as:  LOPRESSOR  TAKE 1 TABLET (25 MG TOTAL) BY MOUTH 2 (TWO) TIMES DAILY.     ranitidine 150 MG tablet  Commonly known as:  ZANTAC  Take 1 tablet (150 mg total) by mouth 2 (two) times daily.     rivaroxaban 20 MG Tabs tablet  Commonly known as:  XARELTO  Take 1 tablet (20 mg total) by mouth daily.     sucralfate 1 g tablet  Commonly known as:  CARAFATE  Take 1 g by mouth 4 (four) times daily. Reported on 07/12/2015     tamsulosin  0.4 MG Caps capsule  Commonly known as:  FLOMAX  Take 1 capsule (0.4 mg total) by mouth daily.     tiotropium 18 MCG inhalation capsule  Commonly known as:  SPIRIVA  Place 1 capsule (18 mcg total) into inhaler and inhale daily.        Allergies:  Allergies  Allergen Reactions  . Codeine Nausea And Vomiting  . Penicillins Hives  . Sulfa Antibiotics   .  Doxycycline Rash  . Erythromycin Rash  . Latex Rash    Family History: Family History  Problem Relation Age of Onset  . Kidney disease Brother   . Heart disease Mother   . Hypothyroidism Mother   . Diabetes Mother   . Heart attack Mother   . Hypertension Mother   . Heart disease Father   . Heart attack Father   . Breast cancer Sister   . Hypothyroidism Sister   . Diabetes Sister   . Ovarian cancer Sister   . Diabetes Brother   . Prostate cancer Brother   . Hypothyroidism Daughter     Social History:  reports that she quit smoking about 3 years ago. Her smoking use included Cigarettes. She has a 10 pack-year smoking history. She has never used smokeless tobacco. She reports that she does not drink alcohol or use illicit drugs.  ROS: UROLOGY Frequent Urination?: No Hard to postpone urination?: No Burning/pain with urination?: No Get up at night to urinate?: No Leakage of urine?: No Urine stream starts and stops?: No Trouble starting stream?: No Do you have to strain to urinate?: No Blood in urine?: No Urinary tract infection?: No Sexually transmitted disease?: No Injury to kidneys or bladder?: No Painful intercourse?: No Weak stream?: No Currently pregnant?: No Vaginal bleeding?: No Last menstrual period?: n  Gastrointestinal Nausea?: No Vomiting?: No Indigestion/heartburn?: No Diarrhea?: No Constipation?: No  Constitutional Fever: No Night sweats?: No Weight loss?: No Fatigue?: No  Skin Skin rash/lesions?: No Itching?: No  Eyes Blurred vision?: No Double vision?:  No  Ears/Nose/Throat Sore throat?: No Sinus problems?: No  Hematologic/Lymphatic Swollen glands?: No Easy bruising?: No  Cardiovascular Leg swelling?: No Chest pain?: No  Respiratory Cough?: No Shortness of breath?: No  Endocrine Excessive thirst?: No  Musculoskeletal Back pain?: No Joint pain?: No  Neurological Headaches?: No Dizziness?: No  Psychologic Depression?: No Anxiety?: No  Physical Exam: BP 152/67 mmHg  Pulse 82  Resp 16  Ht 5\' 4"  (1.626 m)  Wt 124 lb (56.246 kg)  BMI 21.27 kg/m2  Constitutional:  Alert and oriented, No acute distress, ambulating using a walker HEENT: [place] AT, moist mucus membranes.  Trachea midline, no masses. Cardiovascular: No clubbing, cyanosis, or edema. Respiratory: Normal respiratory effort, no increased work of breathing. Skin: No rashes, bruises or suspicious lesions. Neurologic: Grossly intact, no focal deficits, moving all 4 extremities. Psychiatric: Normal mood and affect.  Assessment & Plan:    1. Urinary retention-  S/p fall and advancing dementia.  Voiding trial successful today. Patient returned to the office in the afternoon and stated that she was able to void twice since catheter removal.   PVR 65ml.  patient advised to notify our office if she develops any further difficulty voiding. She has a follow-up appointment in March. She will obtain her KUB prior to this appointment and we will review those results at that time.  2. Vaginal Atrophy- Continue vaginal estrogen cream  3. Kidney Stones- Patient will obtain her KUB and I will call her with the results.    Return for as scheduled in March.  These notes generated with voice recognition software. I apologize for typographical errors.  Herbert Moors, [facility] [address] [place], [place] [address] [phone]

## 2015-07-12 NOTE — Progress Notes (Signed)
Post Cath Removal PVR  Bladder Scan Patient void: 85 ml Performed By: Larna Daughters

## 2015-07-12 NOTE — Progress Notes (Signed)
Fill and Pull Catheter Removal  Patient is present today for a catheter removal.  Patient was cleaned and prepped in a sterile fashion 180 ml of sterile water/ saline was instilled into the bladder when the patient felt the urge to urinate. 10 ml of water was then drained from the balloon.  A 14 FR foley cath was removed from the bladder no complications were noted .  Patient as then given some time to void on their own.  Patient could not void on their own after some time and will return at 3 PM for a bladder scan.  Patient tolerated well.  Preformed by: Golden Hurter, CMA  Follow up/ Additional notes: Returning at 3 PM for a bladder scan.

## 2015-07-12 NOTE — Progress Notes (Signed)
Patient ID: [name], female   DOB: 1935/09/12, 80 y.o.   MRN: [id number]  Home Health orders from [facility] received. Start of care 07/05/15.  Certification period 07/05/15 through 09/02/15. Orders are reviewed, signed and faxed.

## 2015-07-13 DIAGNOSIS — I5032 Chronic diastolic (congestive) heart failure: Secondary | ICD-10-CM | POA: Diagnosis not present

## 2015-07-13 DIAGNOSIS — L89620 Pressure ulcer of left heel, unstageable: Secondary | ICD-10-CM | POA: Diagnosis not present

## 2015-07-13 DIAGNOSIS — F0390 Unspecified dementia without behavioral disturbance: Secondary | ICD-10-CM | POA: Diagnosis not present

## 2015-07-13 DIAGNOSIS — E1140 Type 2 diabetes mellitus with diabetic neuropathy, unspecified: Secondary | ICD-10-CM | POA: Diagnosis not present

## 2015-07-13 DIAGNOSIS — R339 Retention of urine, unspecified: Secondary | ICD-10-CM | POA: Diagnosis not present

## 2015-07-13 DIAGNOSIS — J449 Chronic obstructive pulmonary disease, unspecified: Secondary | ICD-10-CM | POA: Diagnosis not present

## 2015-07-14 DIAGNOSIS — E1140 Type 2 diabetes mellitus with diabetic neuropathy, unspecified: Secondary | ICD-10-CM | POA: Diagnosis not present

## 2015-07-14 DIAGNOSIS — R339 Retention of urine, unspecified: Secondary | ICD-10-CM | POA: Diagnosis not present

## 2015-07-14 DIAGNOSIS — J449 Chronic obstructive pulmonary disease, unspecified: Secondary | ICD-10-CM | POA: Diagnosis not present

## 2015-07-14 DIAGNOSIS — F0390 Unspecified dementia without behavioral disturbance: Secondary | ICD-10-CM | POA: Diagnosis not present

## 2015-07-14 DIAGNOSIS — I5032 Chronic diastolic (congestive) heart failure: Secondary | ICD-10-CM | POA: Diagnosis not present

## 2015-07-14 DIAGNOSIS — L89620 Pressure ulcer of left heel, unstageable: Secondary | ICD-10-CM | POA: Diagnosis not present

## 2015-07-15 DIAGNOSIS — J449 Chronic obstructive pulmonary disease, unspecified: Secondary | ICD-10-CM | POA: Diagnosis not present

## 2015-07-15 DIAGNOSIS — L89620 Pressure ulcer of left heel, unstageable: Secondary | ICD-10-CM | POA: Diagnosis not present

## 2015-07-15 DIAGNOSIS — E1140 Type 2 diabetes mellitus with diabetic neuropathy, unspecified: Secondary | ICD-10-CM | POA: Diagnosis not present

## 2015-07-15 DIAGNOSIS — R339 Retention of urine, unspecified: Secondary | ICD-10-CM | POA: Diagnosis not present

## 2015-07-15 DIAGNOSIS — F0390 Unspecified dementia without behavioral disturbance: Secondary | ICD-10-CM | POA: Diagnosis not present

## 2015-07-15 DIAGNOSIS — I5032 Chronic diastolic (congestive) heart failure: Secondary | ICD-10-CM | POA: Diagnosis not present

## 2015-07-16 ENCOUNTER — Ambulatory Visit (INDEPENDENT_AMBULATORY_CARE_PROVIDER_SITE_OTHER): Payer: Medicare Other | Admitting: Internal Medicine

## 2015-07-16 ENCOUNTER — Encounter: Payer: Self-pay | Admitting: Internal Medicine

## 2015-07-16 VITALS — BP 134/46 | HR 90 | Ht 64.0 in | Wt 124.0 lb

## 2015-07-16 DIAGNOSIS — E1122 Type 2 diabetes mellitus with diabetic chronic kidney disease: Secondary | ICD-10-CM

## 2015-07-16 DIAGNOSIS — I480 Paroxysmal atrial fibrillation: Secondary | ICD-10-CM | POA: Diagnosis not present

## 2015-07-16 DIAGNOSIS — J449 Chronic obstructive pulmonary disease, unspecified: Secondary | ICD-10-CM

## 2015-07-16 DIAGNOSIS — F419 Anxiety disorder, unspecified: Secondary | ICD-10-CM | POA: Diagnosis not present

## 2015-07-16 DIAGNOSIS — N181 Chronic kidney disease, stage 1: Secondary | ICD-10-CM

## 2015-07-16 DIAGNOSIS — F0150 Vascular dementia without behavioral disturbance: Secondary | ICD-10-CM | POA: Diagnosis not present

## 2015-07-16 DIAGNOSIS — R339 Retention of urine, unspecified: Secondary | ICD-10-CM | POA: Diagnosis not present

## 2015-07-16 DIAGNOSIS — L89629 Pressure ulcer of left heel, unspecified stage: Secondary | ICD-10-CM | POA: Diagnosis not present

## 2015-07-16 DIAGNOSIS — R251 Tremor, unspecified: Secondary | ICD-10-CM

## 2015-07-16 MED ORDER — ALBUTEROL SULFATE HFA 108 (90 BASE) MCG/ACT IN AERS: 2.0000 | INHALATION_SPRAY | Freq: Three times a day (TID) | RESPIRATORY_TRACT | Status: DC

## 2015-07-16 MED ORDER — MOMETASONE FURO-FORMOTEROL FUM 100-5 MCG/ACT IN AERO: 2.0000 | INHALATION_SPRAY | Freq: Two times a day (BID) | RESPIRATORY_TRACT | Status: AC

## 2015-07-16 NOTE — Progress Notes (Signed)
Date:  07/16/2015   Name:  [name]   DOB:  1936/05/03   MRN:  [id number]   Chief Complaint: Hospitalization Follow-up and Dementia  Patient is here for hospital follow-up.  She was hospitalized on 07/01/2015 and discharged on 07/03/2015 from [facility].  She was noted with weakness, and confusion felt secondary to multi-infarct dementia.   CT of the head showed no evidence of new stroke.  EEG showed slow waves consistent with dementia.   Currently being taken care of by her family and her husband.  Different family members are taking turns being with her at all times.  They also have an aide from an agency coming 4 hours a day Monday through Friday. She was also noted to have urinary retention. A Foley catheter was placed.  She was discharged with the catheter and followed up with urology.   The catheter was removed on January 30 and she had successful voiding afterwards. Home health is visiting twice a week - checking vital signs and reviewing issues. Daughter reports the metoprolol was not on her discharge summary. There is no mention of this being discontinued. Patient will see her cardiologist in 1 week. I recommend resuming metoprolol at this time.  Tremors -  She is having increasing tremors in both upper extremities and sometimes in her legs as well.  Says they're not painful but the make it hard for her to do usual activities.  She denies a family history of tremor.  The tremor does seem to come and go and is worse when she is cold. She is scheduled to follow-up with neurology in the near future.  diabetes -  Sugars have been higher since leaving the hospital.  Ranging around  200-250.  She denies polyuria, blurred vision , or fatigue.  Her energy level is actually much improved.  Her appetite is good according to her daughter.  She is on Januvia 100 mg daily. Heel ulcer -  A dry ulcer was noted on her heel last visit. She was treated with cephalexin. In the hospital they noted the  ulcer and obtained an x-ray to rule out osteomyelitis.  No definitive treatment was done for the area.  Her daughter is doing a wet to dry dressing.  Patient says that the heel is slightly tender but not severe. She is able to wear closed back issue. There's been no drainage or odor.  Review of Systems  Constitutional: Negative for fever, chills, appetite change and fatigue.  HENT: Negative for hearing loss, tinnitus and trouble swallowing.   Respiratory: Positive for shortness of breath (wearing O2 all the time) and wheezing. Negative for cough and chest tightness.   Cardiovascular: Negative for chest pain, palpitations and leg swelling.  Gastrointestinal: Negative for abdominal pain and constipation.  Endocrine: Positive for cold intolerance.  Genitourinary: Positive for frequency. Negative for dysuria and hematuria.  Musculoskeletal: Positive for gait problem. Negative for myalgias, joint swelling and arthralgias.  Skin: Positive for wound.  Neurological: Positive for tremors. Negative for dizziness, speech difficulty, numbness and headaches.  Hematological: Negative for adenopathy.  Psychiatric/Behavioral: Positive for confusion. Negative for hallucinations and dysphoric mood. The patient is nervous/anxious.     Patient Active Problem List   Diagnosis Date Noted  . Type 2 diabetes mellitus with stage 1 chronic kidney disease, without long-term current use of insulin ([place]) 07/16/2015  . Urinary retention 07/12/2015  . Pressure ulcer of left heel 07/12/2015  . Malnutrition of moderate degree 07/03/2015  .  Multi-infarct dementia 07/03/2015  . 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 Paroxysmal atrial fibrillation ([place]) 06/25/2015  . Chronic diastolic CHF (congestive heart failure) ([place])   . Left displaced femoral neck fracture ([place])   . Fall   . GERD (gastroesophageal reflux disease) 03/26/2015  . Anxiety 03/26/2015  . Gastroduodenal ulcer 01/05/2015  . Difficulty swallowing solids   .  Esophageal candidiasis ([place])   . Acquired hypothyroidism 08/18/2014  . Big thyroid 04/06/2014  . Systemic lupus erythematosus ([place]) 04/03/2014  . On home oxygen therapy - prn 04/19/2013  . COPD, severe ([place]) 04/19/2013    Prior to Admission medications   Medication Sig Start Date End Date Taking? Authorizing Provider  acidophilus (RISAQUAD) CAPS capsule Take 1 capsule by mouth daily. 07/03/15  Yes Sital Mody, MD  albuterol (PROVENTIL HFA;VENTOLIN HFA) 108 (90 BASE) MCG/ACT inhaler Inhale into the lungs 4 (four) times daily - after meals and at bedtime.    Yes Historical Provider, MD  albuterol (PROVENTIL) (2.5 MG/3ML) 0.083% nebulizer solution Take 2.5 mg by nebulization every 6 (six) hours as needed for wheezing or shortness of breath. Reported on 07/09/2015   Yes Historical Provider, MD  amiodarone (PACERONE) 200 MG tablet Take 1 tablet (200 mg total) by mouth daily. 06/22/15  Yes Glean Hess, MD  cephALEXin (KEFLEX) 500 MG capsule TAKE 1 CAPSULE (500 MG TOTAL) BY MOUTH 4 (FOUR) TIMES DAILY. 06/25/15  Yes Historical Provider, MD  [employer] SENNA 8.6 MG tablet TAKE 1 TABLET (8.6 MG TOTAL) BY MOUTH 2 (TWO) TIMES DAILY. 04/20/15  Yes Historical Provider, MD  DUREZOL 0.05 % EMUL BEGINNING AFTER SURGERY, USE ONE DROP TWICE A DAY IN OPERATIVE EYE. 05/19/15  Yes Historical Provider, MD  feeding supplement, ENSURE ENLIVE, (ENSURE ENLIVE) LIQD Take 237 mLs by mouth 3 (three) times daily between meals. 07/03/15  Yes Sital Mody, MD  JANUVIA 100 MG tablet Take 0.5 tablets (50 mg total) by mouth daily. 06/22/15  Yes Glean Hess, MD  mometasone-formoterol (DULERA) 100-5 MCG/ACT AERO Inhale 2 puffs into the lungs 2 (two) times daily.   Yes Historical Provider, MD  Probiotic Product (ALIGN) 4 MG CAPS Take 1 capsule (4 mg total) by mouth daily. 06/22/15  Yes Glean Hess, MD  ranitidine (ZANTAC) 150 MG tablet Take 1 tablet (150 mg total) by mouth 2 (two) times daily. 06/22/15  Yes Glean Hess, MD    rivaroxaban (XARELTO) 20 MG TABS tablet Take 1 tablet (20 mg total) by mouth daily. 06/22/15  Yes Glean Hess, MD  sucralfate (CARAFATE) 1 G tablet Take 1 g by mouth 4 (four) times daily. Reported on 07/12/2015   Yes Historical Provider, MD  tamsulosin (FLOMAX) 0.4 MG CAPS capsule Take 1 capsule (0.4 mg total) by mouth daily. 07/03/15  Yes Bettey Costa, MD  tiotropium (SPIRIVA) 18 MCG inhalation capsule Place 1 capsule (18 mcg total) into inhaler and inhale daily. 06/22/15  Yes Glean Hess, MD  metoprolol tartrate (LOPRESSOR) 25 MG tablet Reported on 07/16/2015 06/01/15   Historical Provider, MD    Allergies  Allergen Reactions  . Codeine Nausea And Vomiting  . Penicillins Hives  . Sulfa Antibiotics   . Doxycycline Rash  . Erythromycin Rash  . Latex Rash    Past Surgical History  Procedure Laterality Date  . Esophagogastroduodenoscopy  multiple  . Colonoscopy  multiple  . Total abdominal hysterectomy w/ bilateral salpingoophorectomy  1990  . Breast biopsy    . Biopsy thyroid    .  Esophagogastroduodenoscopy (egd) with propofol N/A 12/11/2014    Procedure: ESOPHAGOGASTRODUODENOSCOPY (EGD) WITH PROPOFOL;  Surgeon: [name], MD;  Location: [facility];  Service: Endoscopy;  Laterality: N/A;  cytology brushing  . Cataract extraction w/phaco Left 03/23/2015    Procedure: CATARACT EXTRACTION PHACO AND INTRAOCULAR LENS PLACEMENT (IOC);  Surgeon: Birder Robson, MD;  Location: [facility];  Service: Ophthalmology;  Laterality: Left;  [place] 00:51   . Hip arthroplasty Left 03/26/2015    Procedure: ARTHROPLASTY BIPOLAR HIP (HEMIARTHROPLASTY);  Surgeon: Earnestine Leys, MD;  Location: [facility];  Service: Orthopedics;  Laterality: Left;  . Cataract extraction w/phaco Right 05/25/2015    Procedure: CATARACT EXTRACTION PHACO AND INTRAOCULAR LENS PLACEMENT (IOC);  Surgeon: Birder Robson, MD;  Location: [facility];  Service: Ophthalmology;  Laterality: Right;  [place] 00:57     Social History   Substance Use Topics  . Smoking status: Former Smoker -- 0.25 packs/day for 40 years    Types: Cigarettes    Quit date: 10/11/2011  . Smokeless tobacco: Never Used     Comment: Quit 3 years  . Alcohol Use: No   Depression screen [facility] 2/9 04/12/2015 02/24/2015 02/24/2015  Decreased Interest 0 1 1  Down, Depressed, Hopeless 0 1 1  PHQ - 2 Score 0 2 2  Altered sleeping - 1 1  Tired, decreased energy - 2 1  Change in appetite - 0 0  Feeling bad or failure about yourself  - 0 0  Trouble concentrating - 0 0  Moving slowly or fidgety/restless - 0 0  Suicidal thoughts - 0 0  PHQ-9 Score - 5 4  Difficult doing work/chores - Somewhat difficult Somewhat difficult   Fall Risk  04/12/2015 02/24/2015 02/24/2015  Falls in the past year? Yes No No  Number falls in past yr: 1 - -  Injury with Fall? Yes - -  Risk for fall due to : Impaired balance/gait;Impaired mobility - -  Follow up Falls prevention discussed - -    Medication list has been reviewed and updated.   Physical Exam  Constitutional: She is oriented to person, place, and time. She appears well-developed. No distress.  HENT:  Head: Normocephalic and atraumatic.  Right Ear: No decreased hearing is noted.  Left Ear: No decreased hearing is noted.  Cardiovascular: Normal rate, regular rhythm and normal heart sounds.   Pulses:      Dorsalis pedis pulses are 1+ on the left side.       Posterior tibial pulses are 1+ on the left side.  Pulmonary/Chest: No accessory muscle usage. No respiratory distress. She has decreased breath sounds. She has no wheezes. She has no rhonchi.  Musculoskeletal: Normal range of motion.  Lymphadenopathy:    She has no cervical adenopathy.  Neurological: She is alert and oriented to person, place, and time. She displays tremor.  Course tremor in both hands - right more pronounced at rest  Skin: Skin is warm and dry. No rash noted.     Psychiatric: She has a normal mood and affect. Her speech is normal  and behavior is normal.    BP 134/46 mmHg  Pulse 90  Ht 5\' 4"  (1.626 m)  Wt 124 lb (56.246 kg)  BMI 21.27 kg/m2  SpO2 94%  Assessment and Plan: 1. Type 2 diabetes mellitus with stage 1 chronic kidney disease, without long-term current use of insulin (HCC)  continue oral agents  If blood sugars continue to be elevated call for additional medication  2. Pressure ulcer of  left heel, unspecified pressure ulcer stage Need surgical debridement and more aggressive wound care - AMB referral to wound care center  3. Multi-infarct dementia, without behavioral disturbance Continue supportive care with family members Condition discussed other appropriate interventions with neurology  4. Tremor observed on examination Further evaluation at neurology appointment  5. COPD, severe ([place]) Continue oxygen and other chronic medications Written prescriptions are given for 2 inhalers that have not been received yet from mail order - mometasone-formoterol (DULERA) 100-5 MCG/ACT AERO; Inhale 2 puffs into the lungs 2 (two) times daily.  Dispense: 13 g; Refill: 5 - albuterol (PROVENTIL HFA;VENTOLIN HFA) 108 (90 Base) MCG/ACT inhaler; Inhale 2 puffs into the lungs 4 (four) times daily - after meals and at bedtime.  Dispense: 18 g; Refill: 0  6. Anxiety Would like to avoid use of benzodiazepines if possible  7. Urinary retention Successful voiding trial performed Follow-up with urology as scheduled in March  8. Paroxysmal atrial fibrillation (HCC) Stable rate Resume metoprolol inadvertently discontinued at hospital discharge [place], MD [facility]  07/16/2015

## 2015-07-19 ENCOUNTER — Other Ambulatory Visit: Payer: Medicare Other

## 2015-07-19 DIAGNOSIS — R339 Retention of urine, unspecified: Secondary | ICD-10-CM | POA: Diagnosis not present

## 2015-07-19 DIAGNOSIS — L89620 Pressure ulcer of left heel, unstageable: Secondary | ICD-10-CM | POA: Diagnosis not present

## 2015-07-19 DIAGNOSIS — I5032 Chronic diastolic (congestive) heart failure: Secondary | ICD-10-CM | POA: Diagnosis not present

## 2015-07-19 DIAGNOSIS — F0390 Unspecified dementia without behavioral disturbance: Secondary | ICD-10-CM | POA: Diagnosis not present

## 2015-07-19 DIAGNOSIS — E1140 Type 2 diabetes mellitus with diabetic neuropathy, unspecified: Secondary | ICD-10-CM | POA: Diagnosis not present

## 2015-07-19 DIAGNOSIS — J449 Chronic obstructive pulmonary disease, unspecified: Secondary | ICD-10-CM | POA: Diagnosis not present

## 2015-07-21 ENCOUNTER — Other Ambulatory Visit
Admission: RE | Admit: 2015-07-21 | Discharge: 2015-07-21 | Disposition: A | Discharge status: Home / Self Care | Payer: Medicare Other | Source: Ambulatory Visit | Attending: Cardiovascular Disease | Admitting: Cardiovascular Disease

## 2015-07-21 DIAGNOSIS — I480 Paroxysmal atrial fibrillation: Secondary | ICD-10-CM | POA: Diagnosis not present

## 2015-07-21 LAB — COMPREHENSIVE METABOLIC PANEL
ALT: 15 U/L (ref 14–54)
ANION GAP: 3 — AB (ref 5–15)
AST: 14 U/L — ABNORMAL LOW (ref 15–41)
Albumin: 3.3 g/dL — ABNORMAL LOW (ref 3.5–5.0)
Alkaline Phosphatase: 103 U/L (ref 38–126)
BUN: 16 mg/dL (ref 6–20)
CHLORIDE: 99 mmol/L — AB (ref 101–111)
CO2: 34 mmol/L — ABNORMAL HIGH (ref 22–32)
CREATININE: 0.76 mg/dL (ref 0.44–1.00)
Calcium: 9.2 mg/dL (ref 8.9–10.3)
Glucose, Bld: 235 mg/dL — ABNORMAL HIGH (ref 65–99)
POTASSIUM: 4.2 mmol/L (ref 3.5–5.1)
Sodium: 136 mmol/L (ref 135–145)
Total Bilirubin: 0.4 mg/dL (ref 0.3–1.2)
Total Protein: 7 g/dL (ref 6.5–8.1)

## 2015-07-21 LAB — CBC
HCT: 32.3 % — ABNORMAL LOW (ref 35.0–47.0)
HEMOGLOBIN: 10.6 g/dL — AB (ref 12.0–16.0)
MCH: 28.9 pg (ref 26.0–34.0)
MCHC: 32.8 g/dL (ref 32.0–36.0)
MCV: 88 fL (ref 80.0–100.0)
PLATELETS: 228 10*3/uL (ref 150–440)
RBC: 3.67 MIL/uL — AB (ref 3.80–5.20)
RDW: 14.4 % (ref 11.5–14.5)
WBC: 8.4 10*3/uL (ref 3.6–11.0)

## 2015-07-22 ENCOUNTER — Ambulatory Visit: Payer: Medicare Other | Admitting: Surgery

## 2015-07-23 ENCOUNTER — Other Ambulatory Visit: Payer: Medicare Other

## 2015-07-23 ENCOUNTER — Encounter: Payer: Medicare Other | Attending: Surgery | Admitting: Surgery

## 2015-07-23 DIAGNOSIS — I7300 Raynaud's syndrome without gangrene: Secondary | ICD-10-CM | POA: Insufficient documentation

## 2015-07-23 DIAGNOSIS — E1122 Type 2 diabetes mellitus with diabetic chronic kidney disease: Secondary | ICD-10-CM | POA: Diagnosis not present

## 2015-07-23 DIAGNOSIS — J449 Chronic obstructive pulmonary disease, unspecified: Secondary | ICD-10-CM | POA: Diagnosis not present

## 2015-07-23 DIAGNOSIS — F0390 Unspecified dementia without behavioral disturbance: Secondary | ICD-10-CM | POA: Diagnosis not present

## 2015-07-23 DIAGNOSIS — N181 Chronic kidney disease, stage 1: Secondary | ICD-10-CM | POA: Diagnosis not present

## 2015-07-23 DIAGNOSIS — E11621 Type 2 diabetes mellitus with foot ulcer: Secondary | ICD-10-CM | POA: Insufficient documentation

## 2015-07-23 DIAGNOSIS — Z880 Allergy status to penicillin: Secondary | ICD-10-CM | POA: Insufficient documentation

## 2015-07-23 DIAGNOSIS — Z87891 Personal history of nicotine dependence: Secondary | ICD-10-CM | POA: Insufficient documentation

## 2015-07-23 DIAGNOSIS — Z7901 Long term (current) use of anticoagulants: Secondary | ICD-10-CM | POA: Insufficient documentation

## 2015-07-23 DIAGNOSIS — I480 Paroxysmal atrial fibrillation: Secondary | ICD-10-CM | POA: Insufficient documentation

## 2015-07-23 DIAGNOSIS — L89622 Pressure ulcer of left heel, stage 2: Secondary | ICD-10-CM | POA: Diagnosis not present

## 2015-07-23 DIAGNOSIS — I509 Heart failure, unspecified: Secondary | ICD-10-CM | POA: Insufficient documentation

## 2015-07-23 DIAGNOSIS — M329 Systemic lupus erythematosus, unspecified: Secondary | ICD-10-CM | POA: Diagnosis not present

## 2015-07-23 DIAGNOSIS — Z992 Dependence on renal dialysis: Secondary | ICD-10-CM | POA: Diagnosis not present

## 2015-07-23 DIAGNOSIS — L89623 Pressure ulcer of left heel, stage 3: Secondary | ICD-10-CM | POA: Insufficient documentation

## 2015-07-24 NOTE — Progress Notes (Addendum)
[name], [name] ([id number]) Visit Report for 07/23/2015 Chief Complaint Document Details Patient Name: [name], [name] 07/23/2015 2:15 Date of Service: PM Medical Record [id number] Number: Patient Account Number: [id number] Dec 19, 1935 (80 y.o. Treating RN: [name] Date of Birth/Sex: Female) Other Clinician: Primary Care Physician: Halina Maidens Treating [name] Referring Physician: Halina Maidens Physician/Extender: Suella Grove in Treatment: 0 Information Obtained from: Patient Chief Complaint Patients presents for treatment of an open diabetic ulcer which is also sure related and has been there for about a month Electronic Signature(s) Signed: 07/23/2015 3:49:37 PM By: [name], FACS Previous Signature: 07/23/2015 2:51:28 PM Version By: [name], FACS Entered By: [name] on 07/23/2015 15:49:37 [name] ([id number]) -------------------------------------------------------------------------------- Debridement Details Patient Name: [name], [name] 07/23/2015 2:15 Date of Service: PM Medical Record [id number] Number: Patient Account Number: [id number] 05/24/1936 (80 y.o. Treating RN: [name] Date of Birth/Sex: Female) Other Clinician: Primary Care Physician: Halina Maidens Treating [name] Referring Physician: Halina Maidens Physician/Extender: Suella Grove in Treatment: 0 Debridement Performed for Wound #1 Left Calcaneus Assessment: Performed By: Physician [name], MD Debridement: Debridement Pre-procedure Yes Verification/Time Out Taken: Start Time: 15:40 Pain Control: Lidocaine 4% Topical Solution Level: Skin/Subcutaneous Tissue Total Area Debrided (L x 0.7 (cm) x 2.2 (cm) = 1.54 (cm) W): Tissue and other Viable, Non-Viable, Eschar, Fibrin/Slough, Skin, Subcutaneous material debrided: Instrument: Forceps, Scissors Bleeding: Minimum Hemostasis Achieved: Pressure End Time: 15:43 Procedural Pain: 6 Post  Procedural Pain: 0 Response to Treatment: Procedure was tolerated well Post Debridement Measurements of Total Wound Length: (cm) 0.7 Stage: Category/Stage III Width: (cm) 2.2 Depth: (cm) 0.2 Volume: (cm) 0.242 Post Procedure Diagnosis Same as Pre-procedure Electronic Signature(s) Signed: 07/23/2015 3:49:03 PM By: [name], FACS Signed: 07/23/2015 5:06:47 PM By: [name] Entered By: [name] on 07/23/2015 15:49:03 [name] ([id number]) -------------------------------------------------------------------------------- HPI Details Patient Name: [name], [name] 07/23/2015 2:15 Date of Service: PM Medical Record [id number] Number: Patient Account Number: [id number] 08-Apr-1936 (80 y.o. Treating RN: [name] Date of Birth/Sex: Female) Other Clinician: Primary Care Physician: Halina Maidens Treating [name] Referring Physician: Halina Maidens Physician/Extender: Suella Grove in Treatment: 0 History of Present Illness Location: left posterior heel Quality: Patient reports experiencing a sharp pain to affected area(s). Severity: Patient states wound (s) are getting better. Duration: Patient has had the wound for < 4 weeks prior to presenting for treatment Timing: Pain in wound is Intermittent (comes and goes Context: The wound appeared gradually over time Modifying Factors: Consults to this date include:was seen by her PCP and put on some antibiotics a while ago Associated Signs and Symptoms: Patient reports having difficulty standing for long periods. HPI Description: 80 year old patient referred to [place] by her PCP Dr. Halina Maidens for a pressure ulcer to the left heel which she's had for about a month. She has a past medical history of COPD, diabetes mellitus type 2 with chronic kidney disease, multi-infarct dementia, anxiety, urinary retention, paroxysmal atrial fibrillation. She has also had moderate malnutrition, multi-infarct dementia,  congestive heart failure, left displaced femoral neck fracture, systemic lupus erythematosus. he is also status total abdominal hysterectomy with bilateral salpingo-oophorectomy, EGDs, hip arthroplasty on the left, cataract surgery. She has been a from a smoker and quit in May 2013 and she smoked for about 40 years. recently admitted to [facility] between January 19 and 07/03/2015. x-ray of the left foot done on 07/02/2015 showed no acute abnormality. the dry ulcer on the left heel was treated with Keflex and in  the past but there was no specific treatment given and there was no drainage. Electronic Signature(s) Signed: 07/23/2015 3:50:50 PM By: [name], FACS Previous Signature: 07/23/2015 2:57:07 PM Version By: [name], FACS Previous Signature: 07/23/2015 2:50:14 PM Version By: [name], FACS Entered By: [name] on 07/23/2015 15:50:50 [name] ([id number]) -------------------------------------------------------------------------------- Physical Exam Details Patient Name: [name], [name] 07/23/2015 2:15 Date of Service: PM Medical Record [id number] Number: Patient Account Number: [id number] 02-Sep-1935 (80 y.o. Treating RN: [name] Date of Birth/Sex: Female) Other Clinician: Primary Care Physician: Halina Maidens Treating [name] Referring Physician: Halina Maidens Physician/Extender: Suella Grove in Treatment: 0 Constitutional . Pulse regular. Respirations normal and unlabored. Afebrile. . Eyes Nonicteric. Reactive to light. Ears, Nose, Mouth, and Throat Lips, teeth, and gums WNL.Marland Kitchen Moist mucosa without lesions. Neck supple and nontender. No palpable supraclavicular or cervical adenopathy. Normal sized without goiter. Respiratory WNL. No retractions.. Cardiovascular Pedal Pulses WNL. No clubbing, cyanosis or edema. Gastrointestinal (GI) Abdomen without masses or tenderness.. No liver or spleen enlargement or  tenderness.. Lymphatic No adneopathy. No adenopathy. No adenopathy. Musculoskeletal Adexa without tenderness or enlargement.. Digits and nails w/o clubbing, cyanosis, infection, petechiae, ischemia, or inflammatory conditions.. Integumentary (Hair, Skin) No suspicious lesions. No crepitus or fluctuance. No peri-wound warmth or erythema. No masses.Marland Kitchen Psychiatric Judgement and insight Intact.. No evidence of depression, anxiety, or agitation.. Notes the left heel has a fairly thick eschar with a bleb and some seropurulent material once this was sharply debrided with forceps and scissors. Electronic Signature(s) Signed: 07/23/2015 3:51:23 PM By: [name], FACS Entered By: [name] on 07/23/2015 15:51:23 [name] ([id number]) -------------------------------------------------------------------------------- Physician Orders Details Patient Name: [name], [name] 07/23/2015 2:15 Date of Service: PM Medical Record [id number] Number: Patient Account Number: [id number] [birth year] (80 y.o. Treating RN: [name] Date of Birth/Sex: Female) Other Clinician: Primary Care Physician: Halina Maidens Treating [name] Referring Physician: Halina Maidens Physician/Extender: Suella Grove in Treatment: 0 Verbal / Phone Orders: Yes Clinician: [name] Read Back and Verified: Yes Diagnosis Coding ICD-10 Coding Code Description E11.621 Type 2 diabetes mellitus with foot ulcer L89.623 Pressure ulcer of left heel, stage 3 Wound Cleansing Wound #1 Left Calcaneus o Clean wound with Normal Saline. Anesthetic Wound #1 Left Calcaneus o Topical Lidocaine 4% cream applied to wound bed prior to debridement Skin Barriers/Peri-Wound Care Wound #1 Left Calcaneus o Skin Prep Primary Wound Dressing Wound #1 Left Calcaneus o Medihoney gel Secondary Dressing Wound #1 Left Calcaneus o Dry Gauze o Boardered Foam Dressing Dressing Change Frequency Wound #1 Left  Calcaneus o Change Dressing Monday, Wednesday, Friday Follow-up Appointments Wound #1 Left Calcaneus o Return Appointment in 1 week. [name], [name] ([id number]) Home Health Wound #1 Left Calcaneus o [place] Visits - [place] Nurse may visit PRN to address patientos wound care needs. o FACE TO FACE ENCOUNTER: MEDICARE and MEDICAID PATIENTS: I certify that this patient is under my care and that I had a face-to-face encounter that meets the physician face-to-face encounter requirements with this patient on this date. The encounter with the patient was in whole or in part for the following MEDICAL CONDITION: (primary reason for [place]) MEDICAL NECESSITY: I certify, that based on my findings, NURSING services are a medically necessary home health service. HOME BOUND STATUS: I certify that my clinical findings support that this patient is homebound (i.e., Due to illness or injury, pt requires aid of supportive devices such as crutches, cane, wheelchairs, walkers, the  use of special transportation or the assistance of another person to leave their place of residence. There is a normal inability to leave the home and doing so requires considerable and taxing effort. Other absences are for medical reasons / religious services and are infrequent or of short duration when for other reasons). o If current dressing causes regression in wound condition, may D/C ordered dressing product/s and apply Normal Saline Moist Dressing daily until next [place] / Other MD appointment. [place] of regression in wound condition at [phone]. o Please direct any NON-WOUND related issues/requests for orders to patient's Primary Care Physician Electronic Signature(s) Signed: 07/23/2015 4:42:12 PM By: [name], FACS Signed: 07/23/2015 5:06:47 PM By: [name] Entered By: [name] on 07/23/2015 16:00:35 [name] ([id number]) -------------------------------------------------------------------------------- Problem List Details Patient Name: [name], [name] 07/23/2015 2:15 Date of Service: PM Medical Record [id number] Number: Patient Account Number: [id number] [birth year] (80 y.o. Treating RN: [name] Date of Birth/Sex: Female) Other Clinician: Primary Care Physician: Halina Maidens Treating [name] Referring Physician: Halina Maidens Physician/Extender: Suella Grove in Treatment: 0 Active Problems ICD-10 Encounter Code Description Active Date Diagnosis E11.621 Type 2 diabetes mellitus with foot ulcer 07/23/2015 Yes L89.623 Pressure ulcer of left heel, stage 3 07/23/2015 Yes Z79.01 Long term (current) use of anticoagulants 07/23/2015 Yes Inactive Problems Resolved Problems Electronic Signature(s) Signed: 07/23/2015 3:48:47 PM By: [name], FACS Previous Signature: 07/23/2015 2:51:15 PM Version By: [name], FACS Entered By: [name] on 07/23/2015 15:48:47 [name] ([id number]) -------------------------------------------------------------------------------- Progress Note Details Patient Name: [name] 07/23/2015 2:15 Date of Service: PM Medical Record [id number] Number: Patient Account Number: [id number] Aug 04, 1935 (80 y.o. Treating RN: [name] Date of Birth/Sex: Female) Other Clinician: Primary Care Physician: Halina Maidens Treating [name] Referring Physician: Halina Maidens Physician/Extender: Suella Grove in Treatment: 0 Subjective Chief Complaint Information obtained from Patient Patients presents for treatment of an open diabetic ulcer which is also sure related and has been there for about a month History of Present Illness (HPI) The following HPI elements were documented for the patient's wound: Location: left posterior heel Quality: Patient reports experiencing a sharp pain to affected area(s). Severity:  Patient states wound (s) are getting better. Duration: Patient has had the wound for < 4 weeks prior to presenting for treatment Timing: Pain in wound is Intermittent (comes and goes Context: The wound appeared gradually over time Modifying Factors: Consults to this date include:was seen by her PCP and put on some antibiotics a while ago Associated Signs and Symptoms: Patient reports having difficulty standing for long periods. 80 year old patient referred to [place] by her PCP Dr. Halina Maidens for a pressure ulcer to the left heel which she's had for about a month. She has a past medical history of COPD, diabetes mellitus type 2 with chronic kidney disease, multi-infarct dementia, anxiety, urinary retention, paroxysmal atrial fibrillation. She has also had moderate malnutrition, multi-infarct dementia, congestive heart failure, left displaced femoral neck fracture, systemic lupus erythematosus. he is also status total abdominal hysterectomy with bilateral salpingo-oophorectomy, EGDs, hip arthroplasty on the left, cataract surgery. She has been a from a smoker and quit in May 2013 and she smoked for about 40 years. recently admitted to [facility] between January 19 and 07/03/2015. x-ray of the left foot done on 07/02/2015 showed no acute abnormality. the dry ulcer on the left heel was treated with Keflex and in the past but there was no specific treatment given  and there was no drainage. Wound History Patient presents with 1 open wound that has been present for approximately 4 weeks. Patient has been treating wound in the following manner: saline. Laboratory tests have not been performed in the last month. Patient reportedly has not tested positive for an antibiotic resistant organism. Patient reportedly has not tested positive for osteomyelitis. Patient reportedly has not had testing performed to evaluate circulation in [place]. ([id number]) the legs. Patient  History Information obtained from Patient. Allergies codeine, penicillin, Sulfa (Sulfonamide Antibiotics), doxycycline, erythromycin base, latex Social History Former smoker - 4 years ago, Marital Status - Married, Alcohol Use - Never, Drug Use - No History, Caffeine Use - Never. Medical History Respiratory Patient has history of Chronic Obstructive Pulmonary Disease (COPD) Cardiovascular Patient has history of Angina - a fib, Congestive Heart Failure Endocrine Patient has history of Type II Diabetes Immunological Patient has history of Lupus Erythematosus - SLE Patient is treated with Oral Agents. Medical And Surgical History Notes Neurologic raynauds Review of Systems (ROS) Constitutional Symptoms (General Health) The patient has no complaints or symptoms. Eyes The patient has no complaints or symptoms. Ear/Nose/Mouth/Throat The patient has no complaints or symptoms. Hematologic/Lymphatic The patient has no complaints or symptoms. Gastrointestinal The patient has no complaints or symptoms. Endocrine The patient has no complaints or symptoms. Genitourinary Complains or has symptoms of Kidney failure/ Dialysis - CKD stage 1. Immunological The patient has no complaints or symptoms. Integumentary (Skin) The patient has no complaints or symptoms. Musculoskeletal The patient has no complaints or symptoms. Neurologic [name], [name] ([id number]) The patient has no complaints or symptoms. Oncologic The patient has no complaints or symptoms. Medications amiodarone 200 mg tablet oral tablet oral Spiriva with HandiHaler 18 mcg and inhalation capsules inhalation capsule, w/inhalation device inhalation Acidophilus capsule oral capsule oral Align 4 mg capsule oral capsule oral Januvia 50 mg tablet oral tablet oral tamsulosin 0.4 mg capsule oral capsule,extended release 24hr oral tamsulosin 0.4 mg capsule oral capsule,extended release 24hr oral albuterol sulfate 2.5 mg/3 mL  (0.083 %) solution for nebulization inhalation solution for nebulization inhalation Proventil HFA 90 mcg/actuation aerosol inhaler inhalation HFA aerosol inhaler inhalation Dulera 100 mcg-5 mcg/actuation HFA aerosol inhaler inhalation HFA aerosol inhaler inhalation metoprolol tartrate 25 mg tablet oral tablet oral Ensure Enlive 0.08 gram-1.5 kcal/mL oral liquid oral liquid oral Xarelto 20 mg tablet oral tablet oral Durezol 0.05 % eye drops ophthalmic drops ophthalmic ranitidine 150 mg tablet oral tablet oral senna 8.6 mg tablet oral tablet oral Objective Constitutional Pulse regular. Respirations normal and unlabored. Afebrile. Vitals Time Taken: 3:07 PM, Height: 64 in, Source: Stated, Weight: 124 lbs, Source: Stated, BMI: 21.3, Temperature: 98.1 F, Pulse: 58 bpm, Respiratory Rate: 16 breaths/min, Blood Pressure: 126/58 mmHg. Eyes Nonicteric. Reactive to light. Ears, Nose, Mouth, and Throat Lips, teeth, and gums WNL.Marland Kitchen Moist mucosa without lesions. Neck supple and nontender. No palpable supraclavicular or cervical adenopathy. Normal sized without goiter. Respiratory [name], [name] ([id number]) WNL. No retractions.. Cardiovascular Pedal Pulses WNL. No clubbing, cyanosis or edema. Gastrointestinal (GI) Abdomen without masses or tenderness.. No liver or spleen enlargement or tenderness.. Lymphatic No adneopathy. No adenopathy. No adenopathy. Musculoskeletal Adexa without tenderness or enlargement.. Digits and nails w/o clubbing, cyanosis, infection, petechiae, ischemia, or inflammatory conditions.Marland Kitchen Psychiatric Judgement and insight Intact.. No evidence of depression, anxiety, or agitation.. General Notes: the left heel has a fairly thick eschar with a bleb and some seropurulent material once this was sharply debrided with forceps and scissors. Integumentary (Hair, Skin)  No suspicious lesions. No crepitus or fluctuance. No peri-wound warmth or erythema. No masses.. Wound #1  status is Open. Original cause of wound was Pressure Injury. The wound is located on the Left Calcaneus. The wound measures 0.7cm length x 2.2cm width x 0.1cm depth; 1.21cm^2 area and 0.121cm^3 volume. The wound is limited to skin breakdown. There is no tunneling or undermining noted. There is a medium amount of serous drainage noted. The wound margin is flat and intact. There is no granulation within the wound bed. There is a large (67-100%) amount of necrotic tissue within the wound bed including Eschar and Adherent Slough. The periwound skin appearance did not exhibit: Callus, Crepitus, Excoriation, Fluctuance, Friable, Induration, Localized Edema, Rash, Scarring, Dry/Scaly, Maceration, Moist, Atrophie Blanche, Cyanosis, Ecchymosis, Hemosiderin Staining, Mottled, Pallor, Rubor, Erythema. The periwound has tenderness on palpation. Assessment Active Problems ICD-10 E11.621 - Type 2 diabetes mellitus with foot ulcer L89.623 - Pressure ulcer of left heel, stage 3 Z79.01 - Long term (current) use of anticoagulants [name], [name] ([id number]) 80 year old patient who has a pressure injury to the left heel along with diabetes mellitus which is fairly well controlled. I have recommended: 1. Aquacel Ag with a foam heel protector. 2. Constant off loading and this has been discussed in great detail with the patient and her caregiver 3. Good control of her diabetes mellitus 4. Proper nutritional support with protein and also vitamin C and zinc Your visits to the wound care center so that we can appropriately change her management and all questions answered Procedures Wound #1 Wound #1 is a Pressure Ulcer located on the Left Calcaneus . There was a Skin/Subcutaneous Tissue Debridement [id number]) debridement with total area of 1.54 sq cm performed by [name], MD. with the following instrument(s): Forceps and Scissors to remove Viable and Non-Viable tissue/material including  Fibrin/Slough, Eschar, Skin, and Subcutaneous after achieving pain control using Lidocaine 4% Topical Solution. A time out was conducted prior to the start of the procedure. A Minimum amount of bleeding was controlled with Pressure. The procedure was tolerated well with a pain level of 6 throughout and a pain level of 0 following the procedure. Post Debridement Measurements: 0.7cm length x 2.2cm width x 0.2cm depth; 0.242cm^3 volume. Post debridement Stage noted as Category/Stage III. Post procedure Diagnosis Wound #1: Same as Pre-Procedure Plan Wound Cleansing: Wound #1 Left Calcaneus: Clean wound with Normal Saline. Anesthetic: Wound #1 Left Calcaneus: Topical Lidocaine 4% cream applied to wound bed prior to debridement Skin Barriers/Peri-Wound Care: Wound #1 Left Calcaneus: Skin Prep Primary Wound Dressing: Wound #1 Left Calcaneus: Medihoney gel Secondary Dressing: [name], [name] ([id number]) Wound #1 Left Calcaneus: Dry Gauze Boardered Foam Dressing Dressing Change Frequency: Wound #1 Left Calcaneus: Change Dressing Monday, Wednesday, Friday Follow-up Appointments: Wound #1 Left Calcaneus: Return Appointment in 1 week. Home Health: Wound #1 Left Calcaneus: Continue Home Health Visits - [place] Nurse may visit PRN to address patient s wound care needs. FACE TO FACE ENCOUNTER: MEDICARE and MEDICAID PATIENTS: I certify that this patient is under my care and that I had a face-to-face encounter that meets the physician face-to-face encounter requirements with this patient on this date. The encounter with the patient was in whole or in part for the following MEDICAL CONDITION: (primary reason for [place]) MEDICAL NECESSITY: I certify, that based on my findings, NURSING services are a medically necessary home health service. HOME BOUND STATUS: I certify that my clinical findings support that this patient is homebound (i.e., Due  to illness or injury, pt  requires aid of supportive devices such as crutches, cane, wheelchairs, walkers, the use of special transportation or the assistance of another person to leave their place of residence. There is a normal inability to leave the home and doing so requires considerable and taxing effort. Other absences are for medical reasons / religious services and are infrequent or of short duration when for other reasons). If current dressing causes regression in wound condition, may D/C ordered dressing product/s and apply Normal Saline Moist Dressing daily until next [place] / Other MD appointment. [place] of regression in wound condition at [phone]. Please direct any NON-WOUND related issues/requests for orders to patient's Primary Care Physician 80 year old patient who has a pressure injury to the left heel along with diabetes mellitus which is fairly well controlled. I have recommended: 1. Aquacel Ag with a foam heel protector. 2. Constant off loading and this has been discussed in great detail with the patient and her caregiver 3. Good control of her diabetes mellitus 4. Proper nutritional support with protein and also vitamin C and zinc Your visits to the wound care center so that we can appropriately change her management and all questions answered Electronic Signature(s) Signed: 07/23/2015 4:43:01 PM By: [name], FACS Previous Signature: 07/23/2015 3:53:42 PM Version By: [name], FACS [name] ([id number]) Entered By: [name] on 07/23/2015 16:43:01 [name] ([id number]) -------------------------------------------------------------------------------- ROS/PFSH Details Patient Name: [name], JEFFORDS 07/23/2015 2:15 Date of Service: PM Medical Record [id number] Number: Patient Account Number: [id number] [birth year] (80 y.o. Treating RN: [name] Date of Birth/Sex: Female) Other Clinician: Primary Care Physician:  Halina Maidens Treating [name] Referring Physician: Halina Maidens Physician/Extender: Suella Grove in Treatment: 0 Information Obtained From Patient Wound History Do you currently have one or more open woundso Yes How many open wounds do you currently haveo 1 Approximately how long have you had your woundso 4 weeks How have you been treating your wound(s) until nowo saline Has your wound(s) ever healed and then re-openedo No Have you had any lab work done in the past montho No Have you tested positive for an antibiotic resistant organism (MRSA, VRE)o No Have you tested positive for osteomyelitis (bone infection)o No Have you had any tests for circulation on your legso No Genitourinary Complaints and Symptoms: Positive for: Kidney failure/ Dialysis - CKD stage 1 Constitutional Symptoms (General Health) Complaints and Symptoms: No Complaints or Symptoms Eyes Complaints and Symptoms: No Complaints or Symptoms Ear/Nose/Mouth/Throat Complaints and Symptoms: No Complaints or Symptoms Hematologic/Lymphatic Complaints and Symptoms: No Complaints or Symptoms Respiratory RANIM, HERTLING ([id number]) Medical History: Positive for: Chronic Obstructive Pulmonary Disease (COPD) Cardiovascular Medical History: Positive for: Angina - a fib; Congestive Heart Failure Gastrointestinal Complaints and Symptoms: No Complaints or Symptoms Endocrine Complaints and Symptoms: No Complaints or Symptoms Medical History: Positive for: Type II Diabetes Treated with: Oral agents Immunological Complaints and Symptoms: No Complaints or Symptoms Medical History: Positive for: Lupus Erythematosus - SLE Integumentary (Skin) Complaints and Symptoms: No Complaints or Symptoms Musculoskeletal Complaints and Symptoms: No Complaints or Symptoms Neurologic Complaints and Symptoms: No Complaints or Symptoms Medical History: Past Medical History Notes: raynauds Oncologic TIFFANNI, BRICCO  ([id number]) Complaints and Symptoms: No Complaints or Symptoms Family and Social History Former smoker - 4 years ago; Marital Status - Married; Alcohol Use: Never; Drug Use: No History; Caffeine Use: Never; Financial Concerns: No; Food, Clothing or Shelter Needs: No; Support System Lacking: No; Transportation Concerns:  No; Advanced Directives: Yes (Not Provided); Patient does not want information on Advanced Directives; Living Will: Yes (Not Provided); Medical Power of Attorney: Yes (Not Provided) Physician Affirmation I have reviewed and agree with the above information. Electronic Signature(s) Signed: 07/23/2015 3:34:59 PM By: [name], FACS Signed: 07/23/2015 5:06:47 PM By: [name] Entered By: [name] on 07/23/2015 15:34:59 [name] ([id number]) -------------------------------------------------------------------------------- SuperBill Details Patient Name: [name] Date of Service: 07/23/2015 Medical Record Number: [id number] Patient Account Number: [id number] Date of Birth/Sex: 10/10/1935 (80 y.o. Female) Treating RN: [name] Primary Care Physician: Halina Maidens Other Clinician: Referring Physician: Halina Maidens Treating Physician/Extender: Frann Rider in Treatment: 0 Diagnosis Coding ICD-10 Codes Code Description [phone] Type 2 diabetes mellitus with foot ulcer L89.623 Pressure ulcer of left heel, stage 3 Z79.01 Long term (current) use of anticoagulants Facility Procedures CPT4 Code: [id number] Description: [id number] - WOUND CARE VISIT-LEV 3 EST PT Modifier: Quantity: 1 CPT4 Code: [id number] Description: [id number] - DEB SUBQ TISSUE 20 SQ CM/< ICD-10 Description Diagnosis E11.621 Type 2 diabetes mellitus with foot ulcer L89.623 Pressure ulcer of left heel, stage 3 Z79.01 Long term (current) use of anticoagulants Modifier: Quantity: 1 Physician Procedures CPT4 Code: [id number] Description: [id number] - WC PHYS LEVEL 4 - NEW PT ICD-10  Description Diagnosis E11.621 Type 2 diabetes mellitus with foot ulcer L89.623 Pressure ulcer of left heel, stage 3 Z79.01 Long term (current) use of anticoagulants Modifier: 25 Quantity: 1 CPT4 Code: [id number] [name] Description: 11042 - WC PHYS SUBQ TISS 20 SQ CM ICD-10 Description Diagnosis E11.621 Type 2 diabetes mellitus with foot ulcer L89.623 Pressure ulcer of left heel, stage 3 Z79.01 Long term (current) use of anticoagulants . ([id number]) Modifier: Quantity: 1 Electronic Signature(s) Signed: 07/23/2015 4:42:12 PM By: [name], FACS Signed: 07/23/2015 5:06:47 PM By: [name] Previous Signature: 07/23/2015 3:54:32 PM Version By: [name], FACS Previous Signature: 07/23/2015 3:53:52 PM Version By: [name], FACS Entered By: [name] on 07/23/2015 16:01:10

## 2015-07-24 NOTE — Progress Notes (Signed)
UNKNOWN, KUSCHEL ([id number]) Visit Report for 07/23/2015 Abuse/Suicide Risk Screen Details Patient Name: [name], [name] 07/23/2015 2:15 Date of Service: PM Medical Record [id number] Number: Patient Account Number: [id number] Apr 08, 1936 (80 y.o. Treating RN: [name] Date of Birth/Sex: Female) Other Clinician: Primary Care Physician: Halina Maidens Treating [name] Referring Physician: Halina Maidens Physician/Extender: Suella Grove in Treatment: 0 Abuse/Suicide Risk Screen Items Answer ABUSE/SUICIDE RISK SCREEN: Has anyone close to you tried to hurt or harm you recentlyo No Do you feel uncomfortable with anyone in your familyo No Has anyone forced you do things that you didnot want to doo No Do you have any thoughts of harming yourselfo No Patient displays signs or symptoms of abuse and/or neglect. No Electronic Signature(s) Signed: 07/23/2015 5:06:47 PM By: [name] Entered By: [name] on 07/23/2015 15:05:21 [name] ([id number]) -------------------------------------------------------------------------------- Activities of Daily Living Details Patient Name: [name], [name] 07/23/2015 2:15 Date of Service: PM Medical Record [id number] Number: Patient Account Number: [id number] 03/21/1936 (80 y.o. Treating RN: [name] Date of Birth/Sex: Female) Other Clinician: Primary Care Physician: Halina Maidens Treating [name] Referring Physician: Halina Maidens Physician/Extender: Suella Grove in Treatment: 0 Activities of Daily Living Items Answer Activities of Daily Living (Please select one for each item) Drive Automobile Not Able Take Medications Completely Able Use Telephone Completely Able Care for Appearance Completely Able Use Toilet Completely Able Bath / Shower Need Assistance Dress Self Completely Able Feed Self Completely Able Walk Completely Able Get In / Out Bed Completely Able Housework Need Assistance Prepare Meals  Completely [place] [address] for Self Need Assistance Electronic Signature(s) Signed: 07/23/2015 5:06:47 PM By: [name] Entered By: [name] on 07/23/2015 15:05:49 [name] ([id number]) -------------------------------------------------------------------------------- Education Assessment Details Patient Name: [name] 07/23/2015 2:15 Date of Service: PM Medical Record [id number] Number: Patient Account Number: [id number] [birth year] (80 y.o. Treating RN: [name] Date of Birth/Sex: Female) Other Clinician: Primary Care Physician: Halina Maidens Treating [name] Referring Physician: Halina Maidens Physician/Extender: Suella Grove in Treatment: 0 Primary Learner Assessed: Patient Learning Preferences/Education Level/Primary Language Learning Preference: Explanation, Demonstration Highest Education Level: College or Above Preferred Language: English Cognitive Barrier Assessment/Beliefs Language Barrier: No Translator Needed: No Memory Deficit: No Emotional Barrier: No Cultural/Religious Beliefs Affecting Medical No Care: Physical Barrier Assessment Impaired Vision: No Impaired Hearing: No Decreased Hand dexterity: No Knowledge/Comprehension Assessment Knowledge Level: Medium Comprehension Level: Medium Ability to understand written Medium instructions: Ability to understand verbal Medium instructions: Motivation Assessment Anxiety Level: Calm Cooperation: Cooperative Education Importance: Acknowledges Need Interest in Health Problems: Asks Questions Perception: Coherent Willingness to Engage in Self- Medium Management Activities: Readiness to Engage in Self- Medium Management Activities: [name], [name] ([id number]) Electronic Signature(s) Signed: 07/23/2015 5:06:47 PM By: [name] Entered By: [name] on 07/23/2015 15:06:17 [name]  ([id number]) -------------------------------------------------------------------------------- Fall Risk Assessment Details Patient Name: [name] 07/23/2015 2:15 Date of Service: PM Medical Record [id number] Number: Patient Account Number: [id number] Mar 18, 1936 (80 y.o. Treating RN: [name] Date of Birth/Sex: Female) Other Clinician: Primary Care Physician: Halina Maidens Treating [name] Referring Physician: Halina Maidens Physician/Extender: Suella Grove in Treatment: 0 Fall Risk Assessment Items Have you had 2 or more falls in the last 12 monthso 0 No Have you had any fall that resulted in injury in the last 12 monthso 0 No FALL RISK ASSESSMENT: History of falling - immediate or within 3 months 0 No Secondary diagnosis 0 No Ambulatory aid None/bed rest/wheelchair/nurse 0 No Crutches/cane/walker 15 Yes Furniture  0 No IV Access/Saline Lock 0 No Gait/Training Normal/bed rest/immobile 0 No Weak 10 Yes Impaired 0 No Mental Status Oriented to own ability 0 Yes Electronic Signature(s) Signed: 07/23/2015 5:06:47 PM By: [name] Entered By: [name] on 07/23/2015 15:06:27 [name] ([id number]) -------------------------------------------------------------------------------- Foot Assessment Details Patient Name: [name], [name] 07/23/2015 2:15 Date of Service: PM Medical Record [id number] Number: Patient Account Number: [id number] 11-30-1935 (80 y.o. Treating RN: [name] Date of Birth/Sex: Female) Other Clinician: Primary Care Physician: Halina Maidens Treating [name] Referring Physician: Halina Maidens Physician/Extender: Suella Grove in Treatment: 0 Foot Assessment Items Site Locations + = Sensation present, - = Sensation absent, C = Callus, U = Ulcer R = Redness, W = Warmth, M = Maceration, PU = Pre-ulcerative lesion F = Fissure, S = Swelling, D = Dryness Assessment Right: Left: Other Deformity: No No Prior Foot Ulcer:  No No Prior Amputation: No No Charcot Joint: No No Ambulatory Status: Ambulatory Without Help Gait: Unsteady Electronic Signature(s) Signed: 07/23/2015 5:06:47 PM By: [name] Entered By: [name] on 07/23/2015 15:06:54 [name] ([id number]) -------------------------------------------------------------------------------- Nutrition Risk Assessment Details Patient Name: [name] 07/23/2015 2:15 Date of Service: PM Medical Record [id number] Number: Patient Account Number: [id number] [birth year] (80 y.o. Treating RN: [name] Date of Birth/Sex: Female) Other Clinician: Primary Care Physician: Halina Maidens Treating [name] Referring Physician: Halina Maidens Physician/Extender: Suella Grove in Treatment: 0 Height (in): Weight (lbs): Body Mass Index (BMI): Nutrition Risk Assessment Items NUTRITION RISK SCREEN: I have an illness or condition that made me change the kind and/or 0 No amount of food I eat I eat fewer than two meals per day 0 No I eat few fruits and vegetables, or milk products 0 No I have three or more drinks of beer, liquor or wine almost every day 0 No I have tooth or mouth problems that make it hard for me to eat 0 No I don't always have enough money to buy the food I need 0 No I eat alone most of the time 0 No I take three or more different prescribed or over-the-counter drugs a 1 Yes day Without wanting to, I have lost or gained 10 pounds in the last six 0 No months I am not always physically able to shop, cook and/or feed myself 0 No Nutrition Protocols Good Risk Protocol 0 No interventions needed Moderate Risk Protocol Electronic Signature(s) Signed: 07/23/2015 5:06:47 PM By: [name] Entered By: [name] on 07/23/2015 15:06:33

## 2015-07-24 NOTE — Progress Notes (Addendum)
CHARL, HERROLD ([id number]) Visit Report for 07/23/2015 Allergy List Details Patient Name: [name], [name]. Date of Service: 07/23/2015 2:15 PM Medical Record Number: [id number] Patient Account Number: [id number] Date of Birth/Sex: 06-10-1936 (80 y.o. Female) Treating RN: [name] Primary Care Physician: Halina Maidens Other Clinician: Referring Physician: Halina Maidens Treating Physician/Extender: Frann Rider in Treatment: 0 Allergies Active Allergies codeine penicillin Sulfa (Sulfonamide Antibiotics) doxycycline erythromycin base latex Allergy Notes Electronic Signature(s) Signed: 07/23/2015 5:06:47 PM By: [name] Entered By: [name] on 07/23/2015 14:59:55 [name] ([id number]) -------------------------------------------------------------------------------- [place] Information Details Patient Name: [name] Date of Service: 07/23/2015 2:15 PM Medical Record Number: [id number] Patient Account Number: [id number] Date of Birth/Sex: 1935-11-27 (80 y.o. Female) Treating RN: [name] Primary Care Physician: Halina Maidens Other Clinician: Referring Physician: Halina Maidens Treating Physician/Extender: Frann Rider in Treatment: 0 Visit Information Patient Arrived: Ambulatory Arrival Time: 14:58 Accompanied By: Mady Gemma Transfer Assistance: None Patient Identification Verified: Yes Secondary Verification Process Yes Completed: Patient Has Alerts: Yes Patient Alerts: Patient on Blood Thinner DMII xarelto Electronic Signature(s) Signed: 07/23/2015 5:06:47 PM By: [name] Entered By: [name] on 07/23/2015 14:58:56 [name] ([id number]) -------------------------------------------------------------------------------- Clinic Level of Care Assessment Details Patient Name: [name] Date of Service: 07/23/2015 2:15 PM Medical Record Number: [id number] Patient Account Number: [id number] Date  of Birth/Sex: 1935/09/04 (80 y.o. Female) Treating RN: [name] Primary Care Physician: Halina Maidens Other Clinician: Referring Physician: Halina Maidens Treating Physician/Extender: Frann Rider in Treatment: 0 Clinic Level of Care Assessment Items TOOL 1 Quantity Score []  - Use when EandM and Procedure is performed on INITIAL visit 0 ASSESSMENTS - Nursing Assessment / Reassessment X - General Physical Exam (combine w/ comprehensive assessment (listed just 1 20 below) when performed on new pt. evals) X - Comprehensive Assessment (HX, ROS, Risk Assessments, Wounds Hx, etc.) 1 25 ASSESSMENTS - Wound and Skin Assessment / Reassessment []  - Dermatologic / Skin Assessment (not related to wound area) 0 ASSESSMENTS - Ostomy and/or Continence Assessment and Care []  - Incontinence Assessment and Management 0 []  - Ostomy Care Assessment and Management (repouching, etc.) 0 PROCESS - Coordination of Care X - Simple Patient / Family Education for ongoing care 1 15 []  - Complex (extensive) Patient / Family Education for ongoing care 0 X - Staff obtains Consents, Records, Test Results / Process Orders 1 10 []  - Staff telephones HHA, Nursing Homes / Clarify orders / etc 0 []  - Routine Transfer to another Facility (non-emergent condition) 0 []  - Routine Hospital Admission (non-emergent condition) 0 X - New Admissions / Biomedical engineer / Ordering NPWT, Apligraf, etc. 1 15 []  - Emergency Hospital Admission (emergent condition) 0 PROCESS - Special Needs []  - Pediatric / Minor Patient Management 0 []  - Isolation Patient Management 0 [name], [name] ([id number]) []  - Hearing / Language / Visual special needs 0 []  - Assessment of Community assistance (transportation, D/C planning, etc.) 0 []  - Additional assistance / Altered mentation 0 []  - Support Surface(s) Assessment (bed, cushion, seat, etc.) 0 INTERVENTIONS - Miscellaneous []  - External ear exam 0 []  - Patient  Transfer (multiple staff / Civil Service fast streamer / Similar devices) 0 []  - Simple Staple / Suture removal (25 or less) 0 []  - Complex Staple / Suture removal (26 or more) 0 []  - Hypo/Hyperglycemic Management (do not check if billed separately) 0 X - Ankle / Brachial Index (ABI) - do not check if billed separately 1 15 Has the patient been seen at  the hospital within the last three years: Yes Total Score: 100 Level Of Care: New/Established - Level 3 Electronic Signature(s) Signed: 07/23/2015 5:06:47 PM By: [name] Entered By: [name] on 07/23/2015 16:01:02 [name] ([id number]) -------------------------------------------------------------------------------- Encounter Discharge Information Details Patient Name: [name]. Date of Service: 07/23/2015 2:15 PM Medical Record Number: [id number] Patient Account Number: [id number] Date of Birth/Sex: 17-Mar-1936 (80 y.o. Female) Treating RN: [name] Primary Care Physician: Halina Maidens Other Clinician: Referring Physician: Halina Maidens Treating Physician/Extender: Frann Rider in Treatment: 0 Encounter Discharge Information Items Discharge Pain Level: 0 Discharge Condition: Stable Ambulatory Status: Ambulatory Discharge Destination: Home Transportation: Private Auto Accompanied By: Mady Gemma Schedule Follow-up Appointment: Yes Medication Reconciliation completed and provided to Patient/Care No [name]: Provided on Clinical Summary of Care: 07/23/2015 Form Type Recipient Paper Patient LK Electronic Signature(s) Signed: 07/23/2015 4:36:38 PM By: [name] Previous Signature: 07/23/2015 4:01:28 PM Version By: Ruthine Dose Entered By: [name] on 07/23/2015 16:36:38 [name] ([id number]) -------------------------------------------------------------------------------- Lower Extremity Assessment Details Patient Name: [name] Date of Service: 07/23/2015 2:15 PM Medical Record  Number: [id number] Patient Account Number: [id number] Date of Birth/Sex: 1936-04-19 (80 y.o. Female) Treating RN: [name] Primary Care Physician: Halina Maidens Other Clinician: Referring Physician: Halina Maidens Treating Physician/Extender: Frann Rider in Treatment: 0 Edema Assessment Assessed: [name]: No] [Right: No] Edema: [Left: Yes] [Right: Yes] Calf Left: Right: Point of Measurement: 32 cm From Medial Instep 29.7 cm 29.6 cm Ankle Left: Right: Point of Measurement: 10 cm From Medial Instep 21.1 cm 20 cm Vascular Assessment Pulses: Posterior Tibial Palpable: [Left:Yes] [Right:Yes] Doppler: [Left:Monophasic] [Right:Monophasic] Dorsalis Pedis Palpable: [Left:Yes] [Right:Yes] Doppler: [Left:Monophasic] [Right:Monophasic] Extremity colors, hair growth, and conditions: Extremity Color: [Left:Normal] [Right:Normal] Hair Growth on Extremity: [Left:No] [Right:No] Temperature of Extremity: [Left:Cool] [Right:Cool] Capillary Refill: [Left:< 3 seconds] [Right:< 3 seconds] Blood Pressure: Brachial: [Left:126] Dorsalis Pedis: 132 [Left:Dorsalis Pedis: [id number] Ankle: Posterior Tibial: 140 [Left:Posterior Tibial: 138 1.11] [Right:1.10] Toe Nail Assessment Left: Right: Thick: No No Discolored: No No Deformed: No No Improper Length and Hygiene: No No [name], [name] ([id number]) Electronic Signature(s) Signed: 07/23/2015 5:06:47 PM By: [name] Entered By: [name] on 07/23/2015 15:20:59 [name] ([id number]) -------------------------------------------------------------------------------- Multi Wound Chart Details Patient Name: [name] Date of Service: 07/23/2015 2:15 PM Medical Record Number: [id number] Patient Account Number: [id number] Date of Birth/Sex: [birth year] (80 y.o. Female) Treating RN: [name] Primary Care Physician: Halina Maidens Other Clinician: Referring Physician: Halina Maidens Treating  Physician/Extender: Frann Rider in Treatment: 0 Vital Signs Height(in): 64 Pulse(bpm): 58 Weight(lbs): 124 Blood Pressure 126/58 (mmHg): Body Mass Index(BMI): 21 Temperature(F): 98.1 Respiratory Rate 16 (breaths/min): Photos: [1:No Photos] [N/A:N/A] Wound Location: [1:Left Calcaneus] [N/A:N/A] Wounding Event: [1:Pressure Injury] [N/A:N/A] Primary Etiology: [1:Pressure Ulcer] [N/A:N/A] Comorbid History: [1:Chronic Obstructive Pulmonary Disease (COPD), Angina, Congestive Heart Failure, Type II Diabetes, Lupus Erythematosus] [N/A:N/A] Date Acquired: [1:06/21/2015] [N/A:N/A] Weeks of Treatment: [1:0] [N/A:N/A] Wound Status: [1:Open] [N/A:N/A] Measurements L x W x D 0.7x2.2x0.1 [N/A:N/A] (cm) Area (cm) : [1:1.21] [N/A:N/A] Volume (cm) : [1:0.121] [N/A:N/A] Classification: [1:Category/Stage II] [N/A:N/A] HBO Classification: [1:Grade 1] [N/A:N/A] Exudate Amount: [1:Medium] [N/A:N/A] Exudate Type: [1:Serous] [N/A:N/A] Exudate Color: [1:amber] [N/A:N/A] Wound Margin: [1:Flat and Intact] [N/A:N/A] Granulation Amount: [1:None Present (0%)] [N/A:N/A] Necrotic Amount: [1:Large (67-100%)] [N/A:N/A] Necrotic Tissue: [1:Eschar, Adherent Slough] [N/A:N/A] Exposed Structures: [1:Fascia: No Fat: No Tendon: No Muscle: No] [N/A:N/A] Joint: No Bone: No Limited to Skin Breakdown Epithelialization: None N/A N/A Periwound Skin Texture: Edema: No N/A N/A Excoriation: No Induration:  No Callus: No Crepitus: No Fluctuance: No Friable: No Rash: No Scarring: No Periwound Skin Maceration: No N/A N/A Moisture: Moist: No Dry/Scaly: No Periwound Skin Color: Atrophie Blanche: No N/A N/A Cyanosis: No Ecchymosis: No Erythema: No Hemosiderin Staining: No Mottled: No Pallor: No Rubor: No Tenderness on Yes N/A N/A Palpation: Wound Preparation: Ulcer Cleansing: N/A N/A Rinsed/Irrigated with Saline Topical Anesthetic Applied: Other: lidocaine 4% Treatment Notes Electronic  Signature(s) Signed: 07/23/2015 5:06:47 PM By: [name] Entered By: [name] on 07/23/2015 15:37:51 [name] ([id number]) -------------------------------------------------------------------------------- [place] Details Patient Name: [name] Date of Service: 07/23/2015 2:15 PM Medical Record Number: [id number] Patient Account Number: [id number] Date of Birth/Sex: 1936/01/06 (80 y.o. Female) Treating RN: [name] Primary Care Physician: Halina Maidens Other Clinician: Referring Physician: Halina Maidens Treating Physician/Extender: Frann Rider in Treatment: 0 Active Inactive Abuse / Safety / Falls / Self Care Management Nursing Diagnoses: Impaired physical mobility Potential for falls Goals: Patient will remain injury free Date Initiated: 07/23/2015 Goal Status: Active Interventions: Assess fall risk on admission and as needed Notes: Orientation to the Wound Care Program Nursing Diagnoses: Knowledge deficit related to the wound healing center program Goals: Patient/caregiver will verbalize understanding of the [facility] Date Initiated: 07/23/2015 Goal Status: Active Interventions: Provide education on orientation to the wound center Notes: Pressure Nursing Diagnoses: Knowledge deficit related to causes and risk factors for pressure ulcer development Goals: Patient will remain free of pressure ulcers [name], [name] ([id number]) Date Initiated: 07/23/2015 Goal Status: Active Interventions: Assess potential for pressure ulcer upon admission and as needed Notes: Wound/Skin Impairment Nursing Diagnoses: Impaired tissue integrity Goals: Patient/caregiver will verbalize understanding of skin care regimen Date Initiated: 07/23/2015 Goal Status: Active Ulcer/skin breakdown will have a volume reduction of 30% by week 4 Date Initiated: 07/23/2015 Goal Status: Active Ulcer/skin breakdown will  have a volume reduction of 50% by week 8 Date Initiated: 07/23/2015 Goal Status: Active Ulcer/skin breakdown will have a volume reduction of 80% by week 12 Date Initiated: 07/23/2015 Goal Status: Active Ulcer/skin breakdown will heal within 14 weeks Date Initiated: 07/23/2015 Goal Status: Active Interventions: Assess ulceration(s) every visit Notes: Electronic Signature(s) Signed: 07/23/2015 5:06:47 PM By: [name] Entered By: [name] on 07/23/2015 15:37:26 [name] ([id number]) -------------------------------------------------------------------------------- Patient/Caregiver Education Details Patient Name: [name] Date of Service: 07/23/2015 2:15 PM Medical Record Number: [id number] Patient Account Number: [id number] Date of Birth/Gender: 1936/02/16 (80 y.o. Female) Treating RN: [name] Primary Care Physician: Halina Maidens Other Clinician: Referring Physician: Halina Maidens Treating Physician/Extender: Frann Rider in Treatment: 0 Education Assessment Education Provided To: Patient and Caregiver Education Topics Provided Wound/Skin Impairment: Handouts: Other: wound care as ordered Methods: Demonstration, Explain/Verbal Responses: State content correctly Electronic Signature(s) Signed: 07/23/2015 4:36:58 PM By: [name] Entered By: [name] on 07/23/2015 16:36:58 [name] ([id number]) -------------------------------------------------------------------------------- Wound Assessment Details Patient Name: [name] Date of Service: 07/23/2015 2:15 PM Medical Record Number: [id number] Patient Account Number: [id number] Date of Birth/Sex: March 23, 1936 (80 y.o. Female) Treating RN: [name] Primary Care Physician: Halina Maidens Other Clinician: Referring Physician: Halina Maidens Treating Physician/Extender: Frann Rider in Treatment: 0 Wound Status Wound Number: 1 Primary Pressure  Ulcer Etiology: Wound Location: Left Calcaneus Wound Open Wounding Event: Pressure Injury Status: Date Acquired: 06/21/2015 Comorbid Chronic Obstructive Pulmonary Disease Weeks Of Treatment: 0 History: (COPD), Angina, Congestive Heart Clustered Wound: No Failure, Type II Diabetes, Lupus Erythematosus Photos Photo Uploaded By: [name] on 07/23/2015 16:49:22 Wound  Measurements Length: (cm) 0.7 Width: (cm) 2.2 Depth: (cm) 0.1 Area: (cm) 1.21 Volume: (cm) 0.121 % Reduction in Area: % Reduction in Volume: Epithelialization: None Tunneling: No Undermining: No Wound Description Classification: Category/Stage II Diabetic Severity [name]): Grade 1 Wound Margin: Flat and Intact Exudate Amount: Medium Exudate Type: Serous Exudate Color: amber Foul Odor After Cleansing: No Wound Bed Granulation Amount: None Present (0%) Exposed Structure Necrotic Amount: Large (67-100%) Fascia Exposed: No [name], [name] ([id number]) Necrotic Quality: Eschar, Adherent Slough Fat Layer Exposed: No Tendon Exposed: No Muscle Exposed: No Joint Exposed: No Bone Exposed: No Limited to Skin Breakdown Periwound Skin Texture Texture Color No Abnormalities Noted: No No Abnormalities Noted: No Callus: No Atrophie Blanche: No Crepitus: No Cyanosis: No Excoriation: No Ecchymosis: No Fluctuance: No Erythema: No Friable: No Hemosiderin Staining: No Induration: No Mottled: No Localized Edema: No Pallor: No Rash: No Rubor: No Scarring: No Temperature / Pain Moisture Tenderness on Palpation: Yes No Abnormalities Noted: No Dry / Scaly: No Maceration: No Moist: No Wound Preparation Ulcer Cleansing: Rinsed/Irrigated with Saline Topical Anesthetic Applied: Other: lidocaine 4%, Treatment Notes Wound #1 (Left Calcaneus) 1. Cleansed with: Clean wound with Normal Saline 2. Anesthetic Topical Lidocaine 4% cream to wound bed prior to debridement 4. Dressing Applied: Medihoney  Gel 5. Secondary Dressing Applied Bordered Foam Dressing Dry Gauze Electronic Signature(s) Signed: 07/23/2015 5:06:47 PM By: [name] Entered By: [name] on 07/23/2015 15:13:39 [name] ([id number]) -------------------------------------------------------------------------------- [place] Details Patient Name: [name] Date of Service: 07/23/2015 2:15 PM Medical Record Number: [id number] Patient Account Number: [id number] Date of Birth/Sex: [birth year] (80 y.o. Female) Treating RN: [name] Primary Care Physician: Halina Maidens Other Clinician: Referring Physician: Halina Maidens Treating Physician/Extender: Frann Rider in Treatment: 0 Vital Signs Time Taken: 15:07 Temperature (F): 98.1 Height (in): 64 Pulse (bpm): 58 Source: Stated Respiratory Rate (breaths/min): 16 Weight (lbs): 124 Blood Pressure (mmHg): 126/58 Source: Stated Reference Range: 80 - 120 mg / dl Body Mass Index (BMI): 21.3 Electronic Signature(s) Signed: 07/23/2015 5:06:47 PM By: [name] Entered By: [name] on 07/23/2015 15:07:27

## 2015-07-26 DIAGNOSIS — L89620 Pressure ulcer of left heel, unstageable: Secondary | ICD-10-CM | POA: Diagnosis not present

## 2015-07-26 DIAGNOSIS — J449 Chronic obstructive pulmonary disease, unspecified: Secondary | ICD-10-CM | POA: Diagnosis not present

## 2015-07-26 DIAGNOSIS — E1140 Type 2 diabetes mellitus with diabetic neuropathy, unspecified: Secondary | ICD-10-CM | POA: Diagnosis not present

## 2015-07-26 DIAGNOSIS — F0390 Unspecified dementia without behavioral disturbance: Secondary | ICD-10-CM | POA: Diagnosis not present

## 2015-07-26 DIAGNOSIS — I5032 Chronic diastolic (congestive) heart failure: Secondary | ICD-10-CM | POA: Diagnosis not present

## 2015-07-26 DIAGNOSIS — R339 Retention of urine, unspecified: Secondary | ICD-10-CM | POA: Diagnosis not present

## 2015-07-29 DIAGNOSIS — R339 Retention of urine, unspecified: Secondary | ICD-10-CM | POA: Diagnosis not present

## 2015-07-29 DIAGNOSIS — F0150 Vascular dementia without behavioral disturbance: Secondary | ICD-10-CM | POA: Insufficient documentation

## 2015-07-29 DIAGNOSIS — E1140 Type 2 diabetes mellitus with diabetic neuropathy, unspecified: Secondary | ICD-10-CM | POA: Diagnosis not present

## 2015-07-29 DIAGNOSIS — F0280 Dementia in other diseases classified elsewhere without behavioral disturbance: Secondary | ICD-10-CM | POA: Diagnosis not present

## 2015-07-29 DIAGNOSIS — J449 Chronic obstructive pulmonary disease, unspecified: Secondary | ICD-10-CM | POA: Diagnosis not present

## 2015-07-29 DIAGNOSIS — F0390 Unspecified dementia without behavioral disturbance: Secondary | ICD-10-CM | POA: Diagnosis not present

## 2015-07-29 DIAGNOSIS — G309 Alzheimer's disease, unspecified: Secondary | ICD-10-CM | POA: Diagnosis not present

## 2015-07-29 DIAGNOSIS — R262 Difficulty in walking, not elsewhere classified: Secondary | ICD-10-CM | POA: Diagnosis not present

## 2015-07-29 DIAGNOSIS — I5032 Chronic diastolic (congestive) heart failure: Secondary | ICD-10-CM | POA: Diagnosis not present

## 2015-07-29 DIAGNOSIS — L89620 Pressure ulcer of left heel, unstageable: Secondary | ICD-10-CM | POA: Diagnosis not present

## 2015-07-30 ENCOUNTER — Ambulatory Visit
Admission: RE | Admit: 2015-07-30 | Discharge: 2015-07-30 | Disposition: A | Discharge status: Home / Self Care | Payer: Medicare Other | Source: Ambulatory Visit | Attending: Specialist | Admitting: Specialist

## 2015-07-30 ENCOUNTER — Ambulatory Visit: Payer: Medicare Other | Admitting: General Surgery

## 2015-07-30 DIAGNOSIS — M329 Systemic lupus erythematosus, unspecified: Secondary | ICD-10-CM | POA: Diagnosis not present

## 2015-07-30 DIAGNOSIS — J439 Emphysema, unspecified: Secondary | ICD-10-CM | POA: Insufficient documentation

## 2015-07-30 DIAGNOSIS — R911 Solitary pulmonary nodule: Secondary | ICD-10-CM | POA: Diagnosis not present

## 2015-07-30 DIAGNOSIS — D3502 Benign neoplasm of left adrenal gland: Secondary | ICD-10-CM | POA: Insufficient documentation

## 2015-08-02 ENCOUNTER — Encounter: Payer: Medicare Other | Admitting: Surgery

## 2015-08-02 DIAGNOSIS — Z7901 Long term (current) use of anticoagulants: Secondary | ICD-10-CM | POA: Diagnosis not present

## 2015-08-02 DIAGNOSIS — I5032 Chronic diastolic (congestive) heart failure: Secondary | ICD-10-CM | POA: Diagnosis not present

## 2015-08-02 DIAGNOSIS — R339 Retention of urine, unspecified: Secondary | ICD-10-CM | POA: Diagnosis not present

## 2015-08-02 DIAGNOSIS — F0390 Unspecified dementia without behavioral disturbance: Secondary | ICD-10-CM | POA: Diagnosis not present

## 2015-08-02 DIAGNOSIS — L89620 Pressure ulcer of left heel, unstageable: Secondary | ICD-10-CM | POA: Diagnosis not present

## 2015-08-02 DIAGNOSIS — E11621 Type 2 diabetes mellitus with foot ulcer: Secondary | ICD-10-CM | POA: Diagnosis not present

## 2015-08-02 DIAGNOSIS — E1140 Type 2 diabetes mellitus with diabetic neuropathy, unspecified: Secondary | ICD-10-CM | POA: Diagnosis not present

## 2015-08-02 DIAGNOSIS — J449 Chronic obstructive pulmonary disease, unspecified: Secondary | ICD-10-CM | POA: Diagnosis not present

## 2015-08-02 DIAGNOSIS — E1122 Type 2 diabetes mellitus with diabetic chronic kidney disease: Secondary | ICD-10-CM | POA: Diagnosis not present

## 2015-08-02 DIAGNOSIS — L89623 Pressure ulcer of left heel, stage 3: Secondary | ICD-10-CM | POA: Diagnosis not present

## 2015-08-03 NOTE — Progress Notes (Addendum)
Madison, [name] ([id number]) Visit Report for 08/02/2015 Chief Complaint Document Details Patient Name: [name], [name] 08/02/2015 2:00 Date of Service: PM Medical Record [id number] Number: Patient Account Number: [id number] February 06, 1936 (80 y.o. Treating RN: Macarthur Critchley Date of Birth/Sex: Female) Other Clinician: Primary Care Physician: Halina Maidens Treating [name] Referring Physician: Halina Maidens Physician/Extender: Suella Grove in Treatment: 1 Information Obtained from: Patient Chief Complaint Patients presents for treatment of an open diabetic ulcer which is also sure related and has been there for about a month Electronic Signature(s) Signed: 08/02/2015 3:18:19 PM By: [name], FACS Entered By: [name] on 08/02/2015 15:18:19 [name] ([id number]) -------------------------------------------------------------------------------- HPI Details Patient Name: Madison, [name] 08/02/2015 2:00 Date of Service: PM Medical Record [id number] Number: Patient Account Number: [id number] [birth year] (80 y.o. Treating RN: Macarthur Critchley Date of Birth/Sex: Female) Other Clinician: Primary Care Physician: Halina Maidens Treating [name] Referring Physician: Halina Maidens Physician/Extender: Suella Grove in Treatment: 1 History of Present Illness Location: left posterior heel Quality: Patient reports experiencing a sharp pain to affected area(s). Severity: Patient states wound (s) are getting better. Duration: Patient has had the wound for < 4 weeks prior to presenting for treatment Timing: Pain in wound is Intermittent (comes and goes Context: The wound appeared gradually over time Modifying Factors: Consults to this date include:was seen by her PCP and put on some antibiotics a while ago Associated Signs and Symptoms: Patient reports having difficulty standing for long periods. HPI Description: 80 year old patient referred to [place] by her PCP Dr.  Halina Maidens for a pressure ulcer to the left heel which she's had for about a month. She has a past medical history of COPD, diabetes mellitus type 2 with chronic kidney disease, multi-infarct dementia, anxiety, urinary retention, paroxysmal atrial fibrillation. She has also had moderate malnutrition, multi-infarct dementia, congestive heart failure, left displaced femoral neck fracture, systemic lupus erythematosus. he is also status total abdominal hysterectomy with bilateral salpingo-oophorectomy, EGDs, hip arthroplasty on the left, cataract surgery. She has been a from a smoker and quit in May 2013 and she smoked for about 40 years. recently admitted to [facility] between January 19 and 07/03/2015. x-ray of the left foot done on 07/02/2015 showed no acute abnormality. the dry ulcer on the left heel was treated with Keflex and in the past but there was no specific treatment given and there was no drainage. 08/02/2015 -- patient says this heel is extremely tender and last week after her debridement was done she had a lot of tenderness and compares it to worse than childbirth and kidney stones. Electronic Signature(s) Signed: 08/02/2015 3:18:52 PM By: [name], FACS Entered By: [name] on 08/02/2015 15:18:52 [name] ([id number]) -------------------------------------------------------------------------------- Physical Exam Details Patient Name: Madison, [name] 08/02/2015 2:00 Date of Service: PM Medical Record [id number] Number: Patient Account Number: [id number] [birth year] (80 y.o. Treating RN: Macarthur Critchley Date of Birth/Sex: Female) Other Clinician: Primary Care Physician: Halina Maidens Treating [name] Referring Physician: Halina Maidens Physician/Extender: Suella Grove in Treatment: 1 Constitutional . Pulse regular. Respirations normal and unlabored. Afebrile. . Eyes Nonicteric. Reactive to light. Ears, Nose, Mouth, and  Throat Lips, teeth, and gums WNL.Marland Kitchen Moist mucosa without lesions. Neck supple and nontender. No palpable supraclavicular or cervical adenopathy. Normal sized without goiter. Respiratory WNL. No retractions.. Cardiovascular Pedal Pulses WNL. No clubbing, cyanosis or edema. Lymphatic No adneopathy. No adenopathy. No adenopathy. Musculoskeletal Adexa without tenderness or enlargement.. Digits and nails w/o clubbing, cyanosis, infection, petechiae, ischemia, or inflammatory  conditions.. Integumentary (Hair, Skin) No suspicious lesions. No crepitus or fluctuance. No peri-wound warmth or erythema. No masses.Marland Kitchen Psychiatric Judgement and insight Intact.. No evidence of depression, anxiety, or agitation.. Notes the left heel has subcutaneous debris and is extremely tender and no curettage was possible today. Of note the pain is way out of proportion and even touching her heel seems to send her into a spasm of pain. Electronic Signature(s) Signed: 08/02/2015 3:19:44 PM By: [name], FACS Entered By: [name] on 08/02/2015 15:19:44 [name] ([id number]) -------------------------------------------------------------------------------- Physician Orders Details Patient Name: Madison, [name] 08/02/2015 2:00 Date of Service: PM Medical Record [id number] Number: Patient Account Number: [id number] Jan 13, 1936 (80 y.o. Treating RN: Macarthur Critchley Date of Birth/Sex: Female) Other Clinician: Primary Care Physician: Halina Maidens Treating [name] Referring Physician: Halina Maidens Physician/Extender: Suella Grove in Treatment: 1 Verbal / Phone Orders: Yes Clinician: Macarthur Critchley Read Back and Verified: Yes Diagnosis Coding Wound Cleansing Wound #1 Left Calcaneus o Cleanse wound with mild soap and water Primary Wound Dressing Wound #1 Left Calcaneus o Santyl Ointment Secondary Dressing Wound #1 Left Calcaneus o Gauze and Kerlix/Conform o Boardered Foam  Dressing Dressing Change Frequency Wound #1 Left Calcaneus o Change dressing every day. Follow-up Appointments Wound #1 Left Calcaneus o Return Appointment in 1 week. Home Health Wound #1 Left [place] Visits - [place] Nurse may visit PRN to address patientos wound care needs. o FACE TO FACE ENCOUNTER: MEDICARE and MEDICAID PATIENTS: I certify that this patient is under my care and that I had a face-to-face encounter that meets the physician face-to-face encounter requirements with this patient on this date. The encounter with the patient was in whole or in part for the following MEDICAL CONDITION: (primary reason for [place]) MEDICAL NECESSITY: I certify, that based on my findings, NURSING services are a medically necessary home health service. HOME BOUND STATUS: I certify that my clinical findings support that this patient is homebound (i.e., Due to illness or injury, pt requires aid of supportive devices such as crutches, cane, wheelchairs, walkers, the use of special transportation or the assistance of another person to leave their place of residence. There is a MILLER, REFFNER ([id number]) normal inability to leave the home and doing so requires considerable and taxing effort. Other absences are for medical reasons / religious services and are infrequent or of short duration when for other reasons). o If current dressing causes regression in wound condition, may D/C ordered dressing product/s and apply Normal Saline Moist Dressing daily until next [place] / Other MD appointment. [place] of regression in wound condition at [phone]. o Please direct any NON-WOUND related issues/requests for orders to patient's Primary Care Physician Notes may have to refer to general surgery, patient unable to tolerate debridement Electronic Signature(s) Signed: 08/02/2015 4:54:11 PM By: Ardean Larsen Signed: 08/02/2015 5:00:32 PM By: [name], FACS Previous Signature: 08/02/2015 3:16:56 PM Version By: [name], FACS Entered By: Rebecca Eaton RN, [place] on 08/02/2015 15:22:55 [name] ([id number]) -------------------------------------------------------------------------------- Problem List Details Patient Name: MAIRANI, CLYNE 08/02/2015 2:00 Date of Service: PM Medical Record [id number] Number: Patient Account Number: [id number] May 30, 1936 (80 y.o. Treating RN: Macarthur Critchley Date of Birth/Sex: Female) Other Clinician: Primary Care Physician: Halina Maidens Treating [name] Referring Physician: Halina Maidens Physician/Extender: Suella Grove in Treatment: 1 Active Problems ICD-10 Encounter Code Description Active Date Diagnosis E11.621 Type 2 diabetes mellitus with foot ulcer 07/23/2015 Yes  [id number] Pressure ulcer of left heel, stage 3 07/23/2015 Yes Z79.01 Long term (current) use of anticoagulants 07/23/2015 Yes Inactive Problems Resolved Problems Electronic Signature(s) Signed: 08/02/2015 3:18:13 PM By: [name], FACS Entered By: [name] on 08/02/2015 15:18:13 [name] ([id number]) -------------------------------------------------------------------------------- Progress Note Details Patient Name: KELSIE, STOVES 08/02/2015 2:00 Date of Service: PM Medical Record [id number] Number: Patient Account Number: [id number] March 30, 1936 (80 y.o. Treating RN: Macarthur Critchley Date of Birth/Sex: Female) Other Clinician: Primary Care Physician: Halina Maidens Treating [name] Referring Physician: Halina Maidens Physician/Extender: Suella Grove in Treatment: 1 Subjective Chief Complaint Information obtained from Patient Patients presents for treatment of an open diabetic ulcer which is also sure related and has been there for about a month History of Present Illness (HPI) The following HPI elements were documented for the  patient's wound: Location: left posterior heel Quality: Patient reports experiencing a sharp pain to affected area(s). Severity: Patient states wound (s) are getting better. Duration: Patient has had the wound for < 4 weeks prior to presenting for treatment Timing: Pain in wound is Intermittent (comes and goes Context: The wound appeared gradually over time Modifying Factors: Consults to this date include:was seen by her PCP and put on some antibiotics a while ago Associated Signs and Symptoms: Patient reports having difficulty standing for long periods. 80 year old patient referred to [place] by her PCP Dr. Halina Maidens for a pressure ulcer to the left heel which she's had for about a month. She has a past medical history of COPD, diabetes mellitus type 2 with chronic kidney disease, multi-infarct dementia, anxiety, urinary retention, paroxysmal atrial fibrillation. She has also had moderate malnutrition, multi-infarct dementia, congestive heart failure, left displaced femoral neck fracture, systemic lupus erythematosus. he is also status total abdominal hysterectomy with bilateral salpingo-oophorectomy, EGDs, hip arthroplasty on the left, cataract surgery. She has been a from a smoker and quit in May 2013 and she smoked for about 40 years. recently admitted to [facility] between January 19 and 07/03/2015. x-ray of the left foot done on 07/02/2015 showed no acute abnormality. the dry ulcer on the left heel was treated with Keflex and in the past but there was no specific treatment given and there was no drainage. 08/02/2015 -- patient says this heel is extremely tender and last week after her debridement was done she had a lot of tenderness and compares it to worse than childbirth and kidney stones. DELSEY, FIEDOR ([id number]) Objective Constitutional Pulse regular. Respirations normal and unlabored. Afebrile. Vitals Time Taken: 2:42 PM, Height: 64 in, Weight: 124  lbs, BMI: 21.3, Temperature: 97.8 F, Pulse: 54 bpm, Blood Pressure: 110/49 mmHg. Eyes Nonicteric. Reactive to light. Ears, Nose, Mouth, and Throat Lips, teeth, and gums WNL.Marland Kitchen Moist mucosa without lesions. Neck supple and nontender. No palpable supraclavicular or cervical adenopathy. Normal sized without goiter. Respiratory WNL. No retractions.. Cardiovascular Pedal Pulses WNL. No clubbing, cyanosis or edema. Lymphatic No adneopathy. No adenopathy. No adenopathy. Musculoskeletal Adexa without tenderness or enlargement.. Digits and nails w/o clubbing, cyanosis, infection, petechiae, ischemia, or inflammatory conditions.Marland Kitchen Psychiatric Judgement and insight Intact.. No evidence of depression, anxiety, or agitation.. General Notes: the left heel has subcutaneous debris and is extremely tender and no curettage was possible today. Of note the pain is way out of proportion and even touching her heel seems to send her into a spasm of pain. Integumentary (Hair, Skin) No suspicious lesions. No crepitus or fluctuance. No peri-wound warmth or erythema. No masses.. Wound #1 status is Open.  Original cause of wound was Pressure Injury. The wound is located on the Left Calcaneus. The wound measures 0.8cm length x 2.5cm width x 0.1cm depth; 1.571cm^2 area and 0.157cm^3 volume. The wound is limited to skin breakdown. There is a medium amount of serous drainage noted. The wound margin is flat and intact. There is no granulation within the wound bed. There is a large (67-100%) amount of necrotic tissue within the wound bed including Adherent Slough. The periwound skin KALIE, WEINHEIMER ([id number]) appearance did not exhibit: Callus, Crepitus, Excoriation, Fluctuance, Friable, Induration, Localized Edema, Rash, Scarring, Dry/Scaly, Maceration, Moist, Atrophie Blanche, Cyanosis, Ecchymosis, Hemosiderin Staining, Mottled, Pallor, Rubor, Erythema. The periwound has tenderness on palpation. General Notes:  scab covering Assessment Active Problems ICD-10 E11.621 - Type 2 diabetes mellitus with foot ulcer L89.623 - Pressure ulcer of left heel, stage 3 Z79.01 - Long term (current) use of anticoagulants In view of the significant pain which is out of proportion with her physical findings I have recommended Santyl ointment locally to be changed daily. I have also recommended: 1. Constant off loading and this has been discussed in great detail with the patient and her caregiver 2. Good control of her diabetes mellitus 3. Proper nutritional support with protein and also vitamin C and zinc. 4. If the pain continues to be significant and I am unable to debride in the outpatient setting she may need the procedure to be done under IV sedation or anesthesia. Your visits to the wound care center so that we can appropriately change her management and all questions answered Plan Wound Cleansing: Wound #1 Left Calcaneus: Cleanse wound with mild soap and water Primary Wound Dressing: Wound #1 Left Calcaneus: Santyl Ointment Secondary Dressing: Wound #1 Left Calcaneus: Gauze and Kerlix/Conform SARRINAH, HENN ([id number]) Boardered Foam Dressing Dressing Change Frequency: Wound #1 Left Calcaneus: Change dressing every day. Follow-up Appointments: Wound #1 Left Calcaneus: Return Appointment in 1 week. Home Health: Wound #1 Left Calcaneus: Continue Home Health Visits - [place] Nurse may visit PRN to address patient s wound care needs. FACE TO FACE ENCOUNTER: MEDICARE and MEDICAID PATIENTS: I certify that this patient is under my care and that I had a face-to-face encounter that meets the physician face-to-face encounter requirements with this patient on this date. The encounter with the patient was in whole or in part for the following MEDICAL CONDITION: (primary reason for [place]) MEDICAL NECESSITY: I certify, that based on my findings, NURSING services are a medically  necessary home health service. HOME BOUND STATUS: I certify that my clinical findings support that this patient is homebound (i.e., Due to illness or injury, pt requires aid of supportive devices such as crutches, cane, wheelchairs, walkers, the use of special transportation or the assistance of another person to leave their place of residence. There is a normal inability to leave the home and doing so requires considerable and taxing effort. Other absences are for medical reasons / religious services and are infrequent or of short duration when for other reasons). If current dressing causes regression in wound condition, may D/C ordered dressing product/s and apply Normal Saline Moist Dressing daily until next [place] / Other MD appointment. [place] of regression in wound condition at [phone]. Please direct any NON-WOUND related issues/requests for orders to patient's Primary Care Physician General Notes: may have to refer to general surgery, patient unable to tolerate debridement In view of the significant pain which is out of proportion with her physical  findings I have recommended Santyl ointment locally to be changed daily. I have also recommended: 1. Constant off loading and this has been discussed in great detail with the patient and her caregiver 2. Good control of her diabetes mellitus 3. Proper nutritional support with protein and also vitamin C and zinc. 4. If the pain continues to be significant and I am unable to debride in the outpatient setting she may need the procedure to be done under IV sedation or anesthesia. Your visits to the wound care center so that we can appropriately change her management and all questions answered CHELSAE, MCGLOIN ([id number]) Electronic Signature(s) Signed: 08/02/2015 5:03:55 PM By: [name], FACS Previous Signature: 08/02/2015 5:03:47 PM Version By: [name], FACS Previous Signature: 08/02/2015  3:21:05 PM Version By: [name], FACS Entered By: [name] on 08/02/2015 17:03:55 [name] ([id number]) -------------------------------------------------------------------------------- SuperBill Details Patient Name: [name] Date of Service: 08/02/2015 Medical Record Number: [id number] Patient Account Number: [id number] Date of Birth/Sex: [birth year] (80 y.o. Female) Treating RN: Macarthur Critchley Primary Care Physician: Halina Maidens Other Clinician: Referring Physician: Halina Maidens Treating Physician/Extender: Frann Rider in Treatment: 1 Diagnosis Coding ICD-10 Codes Code Description [phone] Type 2 diabetes mellitus with foot ulcer L89.623 Pressure ulcer of left heel, stage 3 Z79.01 Long term (current) use of anticoagulants Physician Procedures CPT4 Code: [id number] Description: [id number] - WC PHYS LEVEL 3 - EST PT ICD-10 Description Diagnosis E11.621 Type 2 diabetes mellitus with foot ulcer L89.623 Pressure ulcer of left heel, stage 3 Z79.01 Long term (current) use of anticoagulants Modifier: Quantity: 1 Electronic Signature(s) Signed: 08/02/2015 3:21:25 PM By: [name], FACS Entered By: [name] on 08/02/2015 15:21:25

## 2015-08-04 DIAGNOSIS — E1140 Type 2 diabetes mellitus with diabetic neuropathy, unspecified: Secondary | ICD-10-CM | POA: Diagnosis not present

## 2015-08-04 DIAGNOSIS — Z96642 Presence of left artificial hip joint: Secondary | ICD-10-CM | POA: Diagnosis not present

## 2015-08-04 DIAGNOSIS — I5032 Chronic diastolic (congestive) heart failure: Secondary | ICD-10-CM | POA: Diagnosis not present

## 2015-08-04 DIAGNOSIS — J449 Chronic obstructive pulmonary disease, unspecified: Secondary | ICD-10-CM | POA: Diagnosis not present

## 2015-08-04 DIAGNOSIS — R339 Retention of urine, unspecified: Secondary | ICD-10-CM | POA: Diagnosis not present

## 2015-08-04 DIAGNOSIS — F0390 Unspecified dementia without behavioral disturbance: Secondary | ICD-10-CM | POA: Diagnosis not present

## 2015-08-04 DIAGNOSIS — L89620 Pressure ulcer of left heel, unstageable: Secondary | ICD-10-CM | POA: Diagnosis not present

## 2015-08-06 DIAGNOSIS — E1140 Type 2 diabetes mellitus with diabetic neuropathy, unspecified: Secondary | ICD-10-CM | POA: Diagnosis not present

## 2015-08-06 DIAGNOSIS — J449 Chronic obstructive pulmonary disease, unspecified: Secondary | ICD-10-CM | POA: Diagnosis not present

## 2015-08-06 DIAGNOSIS — F0390 Unspecified dementia without behavioral disturbance: Secondary | ICD-10-CM | POA: Diagnosis not present

## 2015-08-06 DIAGNOSIS — L89620 Pressure ulcer of left heel, unstageable: Secondary | ICD-10-CM | POA: Diagnosis not present

## 2015-08-06 DIAGNOSIS — R339 Retention of urine, unspecified: Secondary | ICD-10-CM | POA: Diagnosis not present

## 2015-08-06 DIAGNOSIS — I5032 Chronic diastolic (congestive) heart failure: Secondary | ICD-10-CM | POA: Diagnosis not present

## 2015-08-09 ENCOUNTER — Ambulatory Visit: Payer: Medicare Other | Admitting: Surgery

## 2015-08-09 DIAGNOSIS — I5032 Chronic diastolic (congestive) heart failure: Secondary | ICD-10-CM | POA: Diagnosis not present

## 2015-08-09 DIAGNOSIS — F0390 Unspecified dementia without behavioral disturbance: Secondary | ICD-10-CM | POA: Diagnosis not present

## 2015-08-09 DIAGNOSIS — L89620 Pressure ulcer of left heel, unstageable: Secondary | ICD-10-CM | POA: Diagnosis not present

## 2015-08-09 DIAGNOSIS — R339 Retention of urine, unspecified: Secondary | ICD-10-CM | POA: Diagnosis not present

## 2015-08-09 DIAGNOSIS — E1140 Type 2 diabetes mellitus with diabetic neuropathy, unspecified: Secondary | ICD-10-CM | POA: Diagnosis not present

## 2015-08-09 DIAGNOSIS — J449 Chronic obstructive pulmonary disease, unspecified: Secondary | ICD-10-CM | POA: Diagnosis not present

## 2015-08-11 DIAGNOSIS — F0390 Unspecified dementia without behavioral disturbance: Secondary | ICD-10-CM | POA: Diagnosis not present

## 2015-08-11 DIAGNOSIS — L89620 Pressure ulcer of left heel, unstageable: Secondary | ICD-10-CM | POA: Diagnosis not present

## 2015-08-11 DIAGNOSIS — E1140 Type 2 diabetes mellitus with diabetic neuropathy, unspecified: Secondary | ICD-10-CM | POA: Diagnosis not present

## 2015-08-11 DIAGNOSIS — J449 Chronic obstructive pulmonary disease, unspecified: Secondary | ICD-10-CM | POA: Diagnosis not present

## 2015-08-11 DIAGNOSIS — R339 Retention of urine, unspecified: Secondary | ICD-10-CM | POA: Diagnosis not present

## 2015-08-11 DIAGNOSIS — I5032 Chronic diastolic (congestive) heart failure: Secondary | ICD-10-CM | POA: Diagnosis not present

## 2015-08-13 DIAGNOSIS — L89620 Pressure ulcer of left heel, unstageable: Secondary | ICD-10-CM | POA: Diagnosis not present

## 2015-08-13 DIAGNOSIS — R339 Retention of urine, unspecified: Secondary | ICD-10-CM | POA: Diagnosis not present

## 2015-08-13 DIAGNOSIS — I5032 Chronic diastolic (congestive) heart failure: Secondary | ICD-10-CM | POA: Diagnosis not present

## 2015-08-13 DIAGNOSIS — E1140 Type 2 diabetes mellitus with diabetic neuropathy, unspecified: Secondary | ICD-10-CM | POA: Diagnosis not present

## 2015-08-13 DIAGNOSIS — J449 Chronic obstructive pulmonary disease, unspecified: Secondary | ICD-10-CM | POA: Diagnosis not present

## 2015-08-13 DIAGNOSIS — F0390 Unspecified dementia without behavioral disturbance: Secondary | ICD-10-CM | POA: Diagnosis not present

## 2015-08-16 DIAGNOSIS — L89620 Pressure ulcer of left heel, unstageable: Secondary | ICD-10-CM | POA: Diagnosis not present

## 2015-08-16 DIAGNOSIS — F0390 Unspecified dementia without behavioral disturbance: Secondary | ICD-10-CM | POA: Diagnosis not present

## 2015-08-16 DIAGNOSIS — E1140 Type 2 diabetes mellitus with diabetic neuropathy, unspecified: Secondary | ICD-10-CM | POA: Diagnosis not present

## 2015-08-16 DIAGNOSIS — J449 Chronic obstructive pulmonary disease, unspecified: Secondary | ICD-10-CM | POA: Diagnosis not present

## 2015-08-16 DIAGNOSIS — I5032 Chronic diastolic (congestive) heart failure: Secondary | ICD-10-CM | POA: Diagnosis not present

## 2015-08-16 DIAGNOSIS — R339 Retention of urine, unspecified: Secondary | ICD-10-CM | POA: Diagnosis not present

## 2015-08-18 ENCOUNTER — Encounter: Payer: Self-pay | Admitting: Obstetrics and Gynecology

## 2015-08-18 ENCOUNTER — Ambulatory Visit
Admission: RE | Admit: 2015-08-18 | Discharge: 2015-08-18 | Disposition: A | Discharge status: Home / Self Care | Payer: Medicare Other | Source: Ambulatory Visit | Attending: Urology | Admitting: Urology

## 2015-08-18 ENCOUNTER — Ambulatory Visit (INDEPENDENT_AMBULATORY_CARE_PROVIDER_SITE_OTHER): Payer: Medicare Other | Admitting: Obstetrics and Gynecology

## 2015-08-18 VITALS — BP 155/73 | HR 58 | Resp 16 | Ht 64.0 in | Wt 114.7 lb

## 2015-08-18 DIAGNOSIS — Z9889 Other specified postprocedural states: Secondary | ICD-10-CM | POA: Diagnosis not present

## 2015-08-18 DIAGNOSIS — I639 Cerebral infarction, unspecified: Secondary | ICD-10-CM | POA: Diagnosis not present

## 2015-08-18 DIAGNOSIS — R3129 Other microscopic hematuria: Secondary | ICD-10-CM | POA: Insufficient documentation

## 2015-08-18 DIAGNOSIS — K5641 Fecal impaction: Secondary | ICD-10-CM | POA: Diagnosis not present

## 2015-08-18 DIAGNOSIS — R339 Retention of urine, unspecified: Secondary | ICD-10-CM

## 2015-08-18 LAB — URINALYSIS, COMPLETE
BILIRUBIN UA: NEGATIVE
Ketones, UA: NEGATIVE
LEUKOCYTES UA: NEGATIVE
Nitrite, UA: NEGATIVE
PH UA: 5.5 (ref 5.0–7.5)
SPEC GRAV UA: 1.02 (ref 1.005–1.030)
Urobilinogen, Ur: 0.2 mg/dL (ref 0.2–1.0)

## 2015-08-18 LAB — MICROSCOPIC EXAMINATION
BACTERIA UA: NONE SEEN
RBC, UA: >30 /hpf — AB (ref 0–?)

## 2015-08-18 MED ORDER — ESTRADIOL 0.1 MG/GM VA CREA: TOPICAL_CREAM | VAGINAL | Status: DC

## 2015-08-18 NOTE — Progress Notes (Signed)
2:36 PM   [name] 1936/03/10 [id number]  Referring provider: Glean Hess, MD [address] [place], [place] [address]  Chief Complaint  Patient presents with  . Urinary Retention  . Results    KUB    HPI: Patient is a 80 year old female with a history of kidney stones presenting today for yearly follow-up visit. Microscopic hematuria including CT urogram and cystoscopy negative for GU pathology last year.  She was scheduled to have a KUB prior to this visit but did not get it because she recently broke her hip and did not feel that she could go.  Urinary complaints today include urinary urgency, occasional dysuria and intermittent difficulty initiating stream. She does experience occasional right-sided flank pain.  She was previously prescribed vaginal shortening cream but states that she used it for a few weeks and did not feel it wasn't working so she discontinued use. Has not been using vaginal estrogen cream as directed.    Interval History:  Recently admitted 07/03/15 for frequent falls and advancing dementia.  Developed urinary retention.   Foley catheter was placed with 800cc initial output.  Patient denies any complaints today but is only able to answer simple questions and is unable to provide an accurate history d/t advanced dementia.  She returned last visit for voiding trial and was successful. She reports that she has been voiding well since. She denies any urinary symptoms. She reports that she has been feeling well.  KUB was performed on 08/18/15 for follow-up on renal stones. No acute findings were seen. No evidence of bowel obstruction.  PMH: Past Medical History  Diagnosis Date  . DM (diabetes mellitus) ([place])   . Mitral valve prolapse     a. Not noted on 03/2015 Echo.  Marland Kitchen COPD (chronic obstructive pulmonary disease) ([place])   . Palpitations   . Osteoarthritis   . Systemic lupus erythematosus ([place])   . Lumbar spondylolysis   . Raynaud's  disease   . Anxiety   . IBS (irritable bowel syndrome)   . Uveitis, anterior     ????  . Rheumatic fever   . Ankylosing spondylitis ([place])     ? how diagnosed  . Insomnia, unspecified   . Chronic peptic ulcer, unspecified site, without mention of hemorrhage, perforation, or obstruction   . History of kidney stones   . Chronic kidney infection   . Asthma     as child  . Hypothyroidism     mass, U/S 6/21 - nodules  . Motion sickness     car - back seat  . GERD (gastroesophageal reflux disease)   . Wears dentures     full upper, partial lower  . Wears contact lenses   . COPD (chronic obstructive pulmonary disease) ([place])     a.Uses 2l prn - followed by Dr. Raul Del.  . Raynaud disease   . Headache   . Neuropathy ([place])   . Wheezing   . Chronic diastolic CHF (congestive heart failure) ([place])     a. 03/2015 Echo: EF 55-60%, Gr 1 DD.  Marland Kitchen PAF (paroxysmal atrial fibrillation) ([place])     a. 03/2015 in setting of hip Fx->Amio/Xarelo;  b. CHA2DS2VASc= 4.  . Left displaced femoral neck fracture ([place])     a. 03/2015 s/p L hemiarthroplasty.    Surgical History: Past Surgical History  Procedure Laterality Date  . Esophagogastroduodenoscopy  multiple  . Colonoscopy  multiple  . Total abdominal hysterectomy w/ bilateral salpingoophorectomy  1990  . Breast  biopsy    . Biopsy thyroid    . Esophagogastroduodenoscopy (egd) with propofol N/A 12/11/2014    Procedure: ESOPHAGOGASTRODUODENOSCOPY (EGD) WITH PROPOFOL;  Surgeon: [name], MD;  Location: [facility];  Service: Endoscopy;  Laterality: N/A;  cytology brushing  . Cataract extraction w/phaco Left 03/23/2015    Procedure: CATARACT EXTRACTION PHACO AND INTRAOCULAR LENS PLACEMENT (IOC);  Surgeon: Birder Robson, MD;  Location: [facility];  Service: Ophthalmology;  Laterality: Left;  [place] 00:51   . Hip arthroplasty Left 03/26/2015    Procedure: ARTHROPLASTY BIPOLAR HIP (HEMIARTHROPLASTY);  Surgeon: Earnestine Leys, MD;  Location: [facility]  ORS;  Service: Orthopedics;  Laterality: Left;  . Cataract extraction w/phaco Right 05/25/2015    Procedure: CATARACT EXTRACTION PHACO AND INTRAOCULAR LENS PLACEMENT (IOC);  Surgeon: Birder Robson, MD;  Location: [facility];  Service: Ophthalmology;  Laterality: Right;  [place] 00:57     Home Medications:    Medication List       This list is accurate as of: 08/18/15 11:59 PM.  Always use your most recent med list.               albuterol (2.5 MG/3ML) 0.083% nebulizer solution  Commonly known as:  PROVENTIL  Take 2.5 mg by nebulization every 6 (six) hours as needed for wheezing or shortness of breath. Reported on 07/09/2015     albuterol 108 (90 Base) MCG/ACT inhaler  Commonly known as:  PROVENTIL HFA;VENTOLIN HFA  Inhale 2 puffs into the lungs 4 (four) times daily - after meals and at bedtime.     ALIGN 4 MG Caps  Take 1 capsule (4 mg total) by mouth daily.     amiodarone 200 MG tablet  Commonly known as:  PACERONE  Take 1 tablet (200 mg total) by mouth daily.     [facility] SENNA 8.6 MG tablet  Generic drug:  senna  TAKE 1 TABLET (8.6 MG TOTAL) BY MOUTH 2 (TWO) TIMES DAILY.     donepezil 5 MG tablet  Commonly known as:  ARICEPT  Take by mouth. Reported on 08/18/2015     estradiol 0.1 MG/GM vaginal cream  Commonly known as:  ESTRACE  Vaginal Estrogen Cream apply a blueberry sized amount to vaginal opening using finger tip nightly for 2 weeks then use 3 times weekly before bed.     feeding supplement (ENSURE ENLIVE) Liqd  Take 237 mLs by mouth 3 (three) times daily between meals.     JANUVIA 100 MG tablet  Generic drug:  sitaGLIPtin  Take 0.5 tablets (50 mg total) by mouth daily.     metoprolol tartrate 25 MG tablet  Commonly known as:  LOPRESSOR  Reported on 07/16/2015     mometasone-formoterol 100-5 MCG/ACT Aero  Commonly known as:  DULERA  Inhale 2 puffs into the lungs 2 (two) times daily.     ranitidine 150 MG tablet  Commonly known as:  ZANTAC  Take 1 tablet (150 mg  total) by mouth 2 (two) times daily.     rivaroxaban 20 MG Tabs tablet  Commonly known as:  XARELTO  Take 1 tablet (20 mg total) by mouth daily.     tamsulosin 0.4 MG Caps capsule  Commonly known as:  FLOMAX  Take 1 capsule (0.4 mg total) by mouth daily.     tiotropium 18 MCG inhalation capsule  Commonly known as:  SPIRIVA  Place 1 capsule (18 mcg total) into inhaler and inhale daily.        Allergies:  Allergies  Allergen Reactions  . Codeine Nausea And Vomiting  . Penicillins Hives  . Sulfa Antibiotics   . Doxycycline Rash  . Erythromycin Rash  . Latex Rash    Family History: Family History  Problem Relation Age of Onset  . Kidney disease Brother   . Heart disease Mother   . Hypothyroidism Mother   . Diabetes Mother   . Heart attack Mother   . Hypertension Mother   . Heart disease Father   . Heart attack Father   . Breast cancer Sister   . Hypothyroidism Sister   . Diabetes Sister   . Ovarian cancer Sister   . Diabetes Brother   . Prostate cancer Brother   . Hypothyroidism Daughter     Social History:  reports that she quit smoking about 3 years ago. Her smoking use included Cigarettes. She has a 10 pack-year smoking history. She has never used smokeless tobacco. She reports that she does not drink alcohol or use illicit drugs.  ROS: UROLOGY Frequent Urination?: No Hard to postpone urination?: No Burning/pain with urination?: Yes Get up at night to urinate?: Yes Leakage of urine?: Yes Urine stream starts and stops?: Yes Trouble starting stream?: Yes Do you have to strain to urinate?: No Blood in urine?: Yes Urinary tract infection?: No Sexually transmitted disease?: No Injury to kidneys or bladder?: No Painful intercourse?: No Weak stream?: No Currently pregnant?: No Vaginal bleeding?: No Last menstrual period?: n  Gastrointestinal Nausea?: No Vomiting?: No Indigestion/heartburn?: Yes Diarrhea?: No Constipation?:  No  Constitutional Fever: No Night sweats?: No Weight loss?: Yes Fatigue?: No  Skin Skin rash/lesions?: No Itching?: No  Eyes Blurred vision?: No Double vision?: No  Ears/Nose/Throat Sore throat?: No Sinus problems?: No  Hematologic/Lymphatic Swollen glands?: No Easy bruising?: Yes  Cardiovascular Leg swelling?: No Chest pain?: No  Respiratory Cough?: Yes Shortness of breath?: Yes  Endocrine Excessive thirst?: No  Musculoskeletal Back pain?: Yes Joint pain?: Yes  Neurological Headaches?: Yes Dizziness?: Yes  Psychologic Depression?: No Anxiety?: No  Physical Exam: BP 155/73 mmHg  Pulse 58  Resp 16  Ht 5\' 4"  (1.626 m)  Wt 114 lb 11.2 oz (52.028 kg)  BMI 19.68 kg/m2  Constitutional:  Alert and oriented, No acute distress, ambulating using a walker HEENT: [place] AT, moist mucus membranes.  Trachea midline, no masses. Cardiovascular: No clubbing, cyanosis, or edema. Respiratory: Normal respiratory effort, no increased work of breathing. Skin: No rashes, bruises or suspicious lesions. Neurologic: Grossly intact, no focal deficits, moving all 4 extremities. Psychiatric: Normal mood and affect.  Assessment & Plan:   Results for orders placed or performed in visit on 08/18/15  Microscopic Examination  Result Value Ref Range   WBC, UA 0-5 0 -  5 /hpf   RBC, UA >30 (A) 0 -  2 /hpf   Epithelial Cells (non renal) 0-10 0 - 10 /hpf   Bacteria, UA None seen None seen/Few  Urinalysis, Complete  Result Value Ref Range   Specific Gravity, UA 1.020 1.005 - 1.030   pH, UA 5.5 5.0 - 7.5   Color, UA Yellow Yellow   Appearance Ur Hazy (A) Clear   Leukocytes, UA Negative Negative   Protein, UA 1+ (A) Negative/Trace   Glucose, UA 2+ (A) Negative   Ketones, UA Negative Negative   RBC, UA 3+ (A) Negative   Bilirubin, UA Negative Negative   Urobilinogen, Ur 0.2 0.2 - 1.0 mg/dL   Nitrite, UA Negative Negative   Microscopic Examination See below:  1. Urinary  retention-  S/p fall and advancing dementia.  Voiding trial successful today. Patient returned to the office in the afternoon and stated that she was able to void twice since catheter removal.   PVR 59ml.  patient advised to notify our office if she develops any further difficulty voiding.   2. Vaginal Atrophy- Continue vaginal estrogen cream  3. Kidney Stones- KUB performed on 08/18/15 demonstrating no renal calculi.   Return in about 6 months (around 02/18/2016) for with [facility].  These notes generated with voice recognition software. I apologize for typographical errors.  Herbert Moors, [facility] [address], [place], [place] [address] [phone]

## 2015-08-20 DIAGNOSIS — F0390 Unspecified dementia without behavioral disturbance: Secondary | ICD-10-CM | POA: Diagnosis not present

## 2015-08-20 DIAGNOSIS — J449 Chronic obstructive pulmonary disease, unspecified: Secondary | ICD-10-CM | POA: Diagnosis not present

## 2015-08-20 DIAGNOSIS — R339 Retention of urine, unspecified: Secondary | ICD-10-CM | POA: Diagnosis not present

## 2015-08-20 DIAGNOSIS — E1140 Type 2 diabetes mellitus with diabetic neuropathy, unspecified: Secondary | ICD-10-CM | POA: Diagnosis not present

## 2015-08-20 DIAGNOSIS — L89620 Pressure ulcer of left heel, unstageable: Secondary | ICD-10-CM | POA: Diagnosis not present

## 2015-08-20 DIAGNOSIS — I5032 Chronic diastolic (congestive) heart failure: Secondary | ICD-10-CM | POA: Diagnosis not present

## 2015-08-23 ENCOUNTER — Encounter: Payer: Medicare Other | Attending: Surgery | Admitting: Surgery

## 2015-08-23 DIAGNOSIS — L89623 Pressure ulcer of left heel, stage 3: Secondary | ICD-10-CM | POA: Insufficient documentation

## 2015-08-23 DIAGNOSIS — Z7901 Long term (current) use of anticoagulants: Secondary | ICD-10-CM | POA: Diagnosis not present

## 2015-08-23 DIAGNOSIS — F0390 Unspecified dementia without behavioral disturbance: Secondary | ICD-10-CM | POA: Diagnosis not present

## 2015-08-23 DIAGNOSIS — I7300 Raynaud's syndrome without gangrene: Secondary | ICD-10-CM | POA: Diagnosis not present

## 2015-08-23 DIAGNOSIS — Z880 Allergy status to penicillin: Secondary | ICD-10-CM | POA: Insufficient documentation

## 2015-08-23 DIAGNOSIS — I509 Heart failure, unspecified: Secondary | ICD-10-CM | POA: Diagnosis not present

## 2015-08-23 DIAGNOSIS — E1122 Type 2 diabetes mellitus with diabetic chronic kidney disease: Secondary | ICD-10-CM | POA: Insufficient documentation

## 2015-08-23 DIAGNOSIS — M329 Systemic lupus erythematosus, unspecified: Secondary | ICD-10-CM | POA: Diagnosis not present

## 2015-08-23 DIAGNOSIS — Z992 Dependence on renal dialysis: Secondary | ICD-10-CM | POA: Insufficient documentation

## 2015-08-23 DIAGNOSIS — I480 Paroxysmal atrial fibrillation: Secondary | ICD-10-CM | POA: Diagnosis not present

## 2015-08-23 DIAGNOSIS — Z87891 Personal history of nicotine dependence: Secondary | ICD-10-CM | POA: Insufficient documentation

## 2015-08-23 DIAGNOSIS — J449 Chronic obstructive pulmonary disease, unspecified: Secondary | ICD-10-CM | POA: Insufficient documentation

## 2015-08-23 DIAGNOSIS — N181 Chronic kidney disease, stage 1: Secondary | ICD-10-CM | POA: Diagnosis not present

## 2015-08-23 DIAGNOSIS — L89622 Pressure ulcer of left heel, stage 2: Secondary | ICD-10-CM | POA: Diagnosis not present

## 2015-08-23 DIAGNOSIS — E11621 Type 2 diabetes mellitus with foot ulcer: Secondary | ICD-10-CM | POA: Diagnosis not present

## 2015-08-23 NOTE — Progress Notes (Signed)
SENAI, LEBOVITS ([id number]) Visit Report for 08/23/2015 Arrival Information Details Patient Name: [name], [name] 08/23/2015 1:30 Date of Service: PM Medical Record [id number] Number: Patient Account Number: [id number] Sep 22, 1935 (80 y.o. Treating RN: Macarthur Critchley Date of Birth/Sex: Female) Other Clinician: Primary Care Physician: Halina Maidens Treating [name] Referring Physician: Halina Maidens Physician/Extender: Suella Grove in Treatment: 4 Visit Information History Since Last Visit All ordered tests and consults were completed: No Patient Arrived: Gilford Rile Added or deleted any medications: No Arrival Time: 13:39 Any new allergies or adverse reactions: No Accompanied By: caregiver Had a fall or experienced change in No Transfer Assistance: None activities of daily living that may affect Patient Has Alerts: Yes risk of falls: Patient Alerts: Patient on Blood Thinner Signs or symptoms of abuse/neglect since last No visito Hospitalized since last visit: No Has Dressing in Place as Prescribed: Yes Pain Present Now: Yes Electronic Signature(s) Signed: 08/23/2015 2:46:25 PM By: Rebecca Eaton RN, Sendra Entered By: Rebecca Eaton RN, Sendra on 08/23/2015 13:40:00 [name] ([id number]) -------------------------------------------------------------------------------- Clinic Level of Care Assessment Details Patient Name: RICHELL, MAROTZ 08/23/2015 1:30 Date of Service: PM Medical Record [id number] Number: Patient Account Number: [id number] 08/02/1935 (80 y.o. Treating RN: Macarthur Critchley Date of Birth/Sex: Female) Other Clinician: Primary Care Physician: Halina Maidens Treating [name] Referring Physician: Halina Maidens Physician/Extender: Suella Grove in Treatment: 4 Clinic Level of Care Assessment Items TOOL 4 Quantity Score X - Use when only an EandM is performed on FOLLOW-UP visit 1 0 ASSESSMENTS - Nursing Assessment / Reassessment X - Reassessment of  Co-morbidities (includes updates in patient status) 1 10 X - Reassessment of Adherence to Treatment Plan 1 5 ASSESSMENTS - Wound and Skin Assessment / Reassessment []  - Simple Wound Assessment / Reassessment - one wound 0 []  - Complex Wound Assessment / Reassessment - multiple wounds 0 []  - Dermatologic / Skin Assessment (not related to wound area) 0 ASSESSMENTS - Focused Assessment []  - Circumferential Edema Measurements - multi extremities 0 []  - Nutritional Assessment / Counseling / Intervention 0 X - Lower Extremity Assessment (monofilament, tuning fork, pulses) 1 5 []  - Peripheral Arterial Disease Assessment (using hand held doppler) 0 ASSESSMENTS - Ostomy and/or Continence Assessment and Care []  - Incontinence Assessment and Management 0 []  - Ostomy Care Assessment and Management (repouching, etc.) 0 PROCESS - Coordination of Care X - Simple Patient / Family Education for ongoing care 1 15 []  - Complex (extensive) Patient / Family Education for ongoing care 0 X - Staff obtains Programmer, systems, Records, Test Results / Process Orders 1 10 X - Staff telephones HHA, Nursing Homes / Clarify orders / etc 1 10 [name], [name] ([id number]) []  - Routine Transfer to another Facility (non-emergent condition) 0 []  - Routine Hospital Admission (non-emergent condition) 0 []  - New Admissions / Biomedical engineer / Ordering NPWT, Apligraf, etc. 0 []  - Emergency Hospital Admission (emergent condition) 0 X - Simple Discharge Coordination 1 10 []  - Complex (extensive) Discharge Coordination 0 PROCESS - Special Needs []  - Pediatric / Minor Patient Management 0 []  - Isolation Patient Management 0 []  - Hearing / Language / Visual special needs 0 []  - Assessment of Community assistance (transportation, D/C planning, etc.) 0 []  - Additional assistance / Altered mentation 0 []  - Support Surface(s) Assessment (bed, cushion, seat, etc.) 0 INTERVENTIONS - Wound Cleansing / Measurement X - Simple Wound  Cleansing - one wound 1 5 []  - Complex Wound Cleansing - multiple wounds 0 X - Wound Imaging (photographs - any number of wounds)  1 5 []  - Wound Tracing (instead of photographs) 0 X - Simple Wound Measurement - one wound 1 5 []  - Complex Wound Measurement - multiple wounds 0 INTERVENTIONS - Wound Dressings X - Small Wound Dressing one or multiple wounds 1 10 []  - Medium Wound Dressing one or multiple wounds 0 []  - Large Wound Dressing one or multiple wounds 0 []  - Application of Medications - topical 0 []  - Application of Medications - injection 0 [name], [name] ([id number]) INTERVENTIONS - Miscellaneous []  - External ear exam 0 []  - Specimen Collection (cultures, biopsies, blood, body fluids, etc.) 0 []  - Specimen(s) / Culture(s) sent or taken to Lab for analysis 0 []  - Patient Transfer (multiple staff / Harrel Lemon Lift / Similar devices) 0 []  - Simple Staple / Suture removal (25 or less) 0 []  - Complex Staple / Suture removal (26 or more) 0 []  - Hypo / Hyperglycemic Management (close monitor of Blood Glucose) 0 []  - Ankle / Brachial Index (ABI) - do not check if billed separately 0 X - Vital Signs 1 5 Has the patient been seen at the hospital within the last three years: Yes Total Score: 95 Level Of Care: New/Established - Level 3 Electronic Signature(s) Signed: 08/23/2015 2:46:25 PM By: Rebecca Eaton, RN, Sendra Entered By: Rebecca Eaton RN, Sendra on 08/23/2015 14:06:34 [name] ([id number]) -------------------------------------------------------------------------------- Encounter Discharge Information Details Patient Name: [name], [name] 08/23/2015 1:30 Date of Service: PM Medical Record [id number] Number: Patient Account Number: [id number] 09-Sep-1935 (80 y.o. Treating RN: Macarthur Critchley Date of Birth/Sex: Female) Other Clinician: Primary Care Physician: Halina Maidens Treating [name] Referring Physician: Halina Maidens Physician/Extender: Suella Grove in Treatment:  4 Encounter Discharge Information Items Facility Notification Discharge Pain Level: 0 Facility Type: Home Health Discharge Condition: Stable Orders Sent: Yes Ambulatory Status: Walker Discharge Destination: Home Private Transportation: Auto Accompanied By: caregiver Schedule Follow-up Appointment: Yes Medication Reconciliation completed and Yes provided to Patient/Care Lameeka Schleifer: Clinical Summary of Care: Electronic Signature(s) Signed: 08/23/2015 2:46:25 PM By: Rebecca Eaton RN, Sendra Entered By: Rebecca Eaton RN, Sendra on 08/23/2015 13:59:56 [name] ([id number]) -------------------------------------------------------------------------------- Lower Extremity Assessment Details Patient Name: [name], [name] 08/23/2015 1:30 Date of Service: PM Medical Record [id number] Number: Patient Account Number: [id number] 08-20-1935 (80 y.o. Treating RN: Macarthur Critchley Date of Birth/Sex: Female) Other Clinician: Primary Care Physician: Halina Maidens Treating [name] Referring Physician: Halina Maidens Physician/Extender: Suella Grove in Treatment: 4 Edema Assessment Assessed: [Left: No] [Right: No] Edema: [Left: N] [Right: o] Vascular Assessment Pulses: Posterior Tibial Dorsalis Pedis Palpable: [Left:Yes] Extremity colors, hair growth, and conditions: Extremity Color: [Left:Normal] Hair Growth on Extremity: [Left:Yes] Temperature of Extremity: [Left:Warm] Capillary Refill: [Left:< 3 seconds] Toe Nail Assessment Left: Right: Thick: No Discolored: No Deformed: No Improper Length and Hygiene: No Electronic Signature(s) Signed: 08/23/2015 2:46:25 PM By: Rebecca Eaton, RN, Sendra Entered By: Rebecca Eaton RN, Sendra on 08/23/2015 13:46:39 [name] ([id number]) -------------------------------------------------------------------------------- Pain Assessment Details Patient Name: [name], [name] 08/23/2015 1:30 Date of Service: PM Medical  Record [id number] Number: Patient Account Number: [id number] [birth year] (80 y.o. Treating RN: Macarthur Critchley Date of Birth/Sex: Female) Other Clinician: Primary Care Physician: Halina Maidens Treating [name] Referring Physician: Halina Maidens Physician/Extender: Suella Grove in Treatment: 4 Active Problems Location of Pain Severity and Description of Pain Patient Has Paino Yes Site Locations Pain Location: Pain in Ulcers With Dressing Change: Yes Duration of the Pain. Constant / Intermittento Intermittent Rate the pain. Current Pain Level: 5 Pain Management and Medication Current Pain Management: Electronic Signature(s) Signed: 08/23/2015 2:46:25 PM  By: Rebecca Eaton, RN, Sendra Entered By: Rebecca Eaton RN, Sendra on 08/23/2015 13:40:14 [name] ([id number]) -------------------------------------------------------------------------------- Patient/Caregiver Education Details Patient Name: [name], [name] 08/23/2015 1:30 Date of Service: PM Medical Record [id number] Number: Patient Account Number: [id number] 10-09-1935 (80 y.o. Treating RN: Macarthur Critchley Date of Birth/Gender: Female) Other Clinician: Primary Care Physician: Halina Maidens Treating [name] Referring Physician: Halina Maidens Physician/Extender: Suella Grove in Treatment: 4 Education Assessment Education Provided To: Patient and Caregiver Education Topics Provided Offloading: Handouts: What is Offloadingo, Other: offloading pad Methods: Explain/Verbal Responses: State content correctly Wound/Skin Impairment: Handouts: Caring for Your Ulcer, Skin Care Do's and Dont's Methods: Explain/Verbal Responses: State content correctly Electronic Signature(s) Signed: 08/23/2015 2:46:25 PM By: Rebecca Eaton, RN, Sendra Entered By: Rebecca Eaton RN, Sendra on 08/23/2015 14:00:23 [name] ([id number]) -------------------------------------------------------------------------------- Wound Assessment  Details Patient Name: [name], [name] 08/23/2015 1:30 Date of Service: PM Medical Record [id number] Number: Patient Account Number: [id number] [birth year] (80 y.o. Treating RN: Macarthur Critchley Date of Birth/Sex: Female) Other Clinician: Primary Care Physician: Halina Maidens Treating [name] Referring Physician: Halina Maidens Physician/Extender: Suella Grove in Treatment: 4 Wound Status Wound Number: 1 Primary Pressure Ulcer Etiology: Wound Location: Left Calcaneus Wound Open Wounding Event: Pressure Injury Status: Date Acquired: 06/21/2015 Comorbid Chronic Obstructive Pulmonary Weeks Of Treatment: 4 History: Disease (COPD), Angina, Congestive Clustered Wound: No Heart Failure, Type II Diabetes, Lupus Erythematosus Photos Photo Uploaded By: Rebecca Eaton, RN, Roslynn Amble on 08/23/2015 14:25:21 Wound Measurements Length: (cm) 0.2 Width: (cm) 0.2 Depth: (cm) 0.1 Area: (cm) 0.031 Volume: (cm) 0.003 % Reduction in Area: 97.4% % Reduction in Volume: 97.5% Epithelialization: None Tunneling: No Undermining: No Wound Description Classification: Category/Stage II Foul Odor Afte Diabetic Severity (Wagner): Grade 1 Wound Margin: Flat and Intact Exudate Amount: None Present r Cleansing: No Wound Bed Granulation Amount: None Present (0%) Exposed Structure Necrotic Amount: None Present (0%) Fascia Exposed: No [name], [name] ([id number]) Fat Layer Exposed: No Tendon Exposed: No Muscle Exposed: No Joint Exposed: No Bone Exposed: No Limited to Skin Breakdown Periwound Skin Texture Texture Color No Abnormalities Noted: No No Abnormalities Noted: No Callus: Yes Atrophie Blanche: No Crepitus: No Cyanosis: No Excoriation: No Ecchymosis: No Fluctuance: No Erythema: No Friable: No Hemosiderin Staining: No Induration: No Mottled: No Localized Edema: No Pallor: No Rash: No Rubor: No Scarring: No Temperature / Pain Moisture Tenderness on Palpation: Yes No  Abnormalities Noted: No Dry / Scaly: Yes Maceration: No Moist: No Wound Preparation Ulcer Cleansing: Rinsed/Irrigated with Saline Topical Anesthetic Applied: Other: lidocaine 4%, Treatment Notes Wound #1 (Left Calcaneus) 1. Cleansed with: Clean wound with Normal Saline 4. Dressing Applied: Other dressing (specify in notes) Notes offloading pad (felt) Electronic Signature(s) Signed: 08/23/2015 2:46:25 PM By: Rebecca Eaton RN, Sendra Entered By: Rebecca Eaton RN, Sendra on 08/23/2015 14:01:16 [name] ([id number]) -------------------------------------------------------------------------------- [place] Details Patient Name: [name], [name] 08/23/2015 1:30 Date of Service: PM Medical Record [id number] Number: Patient Account Number: [id number] 04/01/1936 (80 y.o. Treating RN: Macarthur Critchley Date of Birth/Sex: Female) Other Clinician: Primary Care Physician: Halina Maidens Treating [name] Referring Physician: Halina Maidens Physician/Extender: Suella Grove in Treatment: 4 Vital Signs Time Taken: 13:41 Temperature (F): 98.3 Height (in): 64 Pulse (bpm): 63 Weight (lbs): 124 Blood Pressure (mmHg): 141/58 Body Mass Index (BMI): 21.3 Reference Range: 80 - 120 mg / dl Electronic Signature(s) Signed: 08/23/2015 2:46:25 PM By: Rebecca Eaton RN, Sendra Entered By: Rebecca Eaton RN, Sendra on 08/23/2015 13:42:12

## 2015-08-23 NOTE — Progress Notes (Addendum)
[name], [name] ([id number]) Visit Report for 08/23/2015 Chief Complaint Document Details Patient Name: [name] 08/23/2015 1:30 Date of Service: PM Medical Record [id number] Number: Patient Account Number: [id number] July 30, 1935 (80 y.o. Treating RN: Macarthur Critchley Date of Birth/Sex: Female) Other Clinician: Primary Care Physician: Halina Maidens Treating [name] Referring Physician: Halina Maidens Physician/Extender: Suella Grove in Treatment: 4 Information Obtained from: Patient Chief Complaint Patients presents for treatment of an open diabetic ulcer which is also sure related and has been there for about a month Electronic Signature(s) Signed: 08/23/2015 1:55:31 PM By: [name], FACS Entered By: [name] on 08/23/2015 13:55:30 [name] ([id number]) -------------------------------------------------------------------------------- HPI Details Patient Name: [name], [name] 08/23/2015 1:30 Date of Service: PM Medical Record [id number] Number: Patient Account Number: [id number] 1935-10-02 (80 y.o. Treating RN: Macarthur Critchley Date of Birth/Sex: Female) Other Clinician: Primary Care Physician: Halina Maidens Treating [name] Referring Physician: Halina Maidens Physician/Extender: Suella Grove in Treatment: 4 History of Present Illness Location: left posterior heel Quality: Patient reports experiencing a sharp pain to affected area(s). Severity: Patient states wound (s) are getting better. Duration: Patient has had the wound for < 4 weeks prior to presenting for treatment Timing: Pain in wound is Intermittent (comes and goes Context: The wound appeared gradually over time Modifying Factors: Consults to this date include:was seen by her PCP and put on some antibiotics a while ago Associated Signs and Symptoms: Patient reports having difficulty standing for long periods. HPI Description: 80 year old patient referred to [place] by her PCP Dr.  Halina Maidens for a pressure ulcer to the left heel which she's had for about a month. She has a past medical history of COPD, diabetes mellitus type 2 with chronic kidney disease, multi-infarct dementia, anxiety, urinary retention, paroxysmal atrial fibrillation. She has also had moderate malnutrition, multi-infarct dementia, congestive heart failure, left displaced femoral neck fracture, systemic lupus erythematosus. he is also status total abdominal hysterectomy with bilateral salpingo-oophorectomy, EGDs, hip arthroplasty on the left, cataract surgery. She has been a from a smoker and quit in May 2013 and she smoked for about 40 years. recently admitted to [facility] between January 19 and 07/03/2015. x-ray of the left foot done on 07/02/2015 showed no acute abnormality. the dry ulcer on the left heel was treated with Keflex and in the past but there was no specific treatment given and there was no drainage. 08/02/2015 -- patient says this heel is extremely tender and last week after her debridement was done she had a lot of tenderness and compares it to worse than childbirth and kidney stones. 08/23/2015 -- the patient has not been seen here for about 3 weeks but is in a much better state of mind today and seems to be having less pain. He has a caregiver today who says the patient was down with significant upper respiratory infection. Electronic Signature(s) Signed: 08/23/2015 1:56:19 PM By: [name], FACS Entered By: [name] on 08/23/2015 13:56:19 [name] ([id number]) -------------------------------------------------------------------------------- Physical Exam Details Patient Name: [name], [name] 08/23/2015 1:30 Date of Service: PM Medical Record [id number] Number: Patient Account Number: [id number] 1935/06/15 (80 y.o. Treating RN: Macarthur Critchley Date of Birth/Sex: Female) Other Clinician: Primary Care Physician: Halina Maidens  Treating [name] Referring Physician: Halina Maidens Physician/Extender: Suella Grove in Treatment: 4 Constitutional . Pulse regular. Respirations normal and unlabored. Afebrile. . Eyes Nonicteric. Reactive to light. Ears, Nose, Mouth, and Throat Lips, teeth, and gums WNL.Marland Kitchen Moist mucosa without lesions. Neck supple and nontender. No  palpable supraclavicular or cervical adenopathy. Normal sized without goiter. Respiratory WNL. No retractions.. Cardiovascular Pedal Pulses WNL. No clubbing, cyanosis or edema. Chest Breasts symmetical and no nipple discharge.. Breast tissue WNL, no masses, lumps, or tenderness.. Lymphatic No adneopathy. No adenopathy. No adenopathy. Musculoskeletal Adexa without tenderness or enlargement.. Digits and nails w/o clubbing, cyanosis, infection, petechiae, ischemia, or inflammatory conditions.. Integumentary (Hair, Skin) No suspicious lesions. No crepitus or fluctuance. No peri-wound warmth or erythema. No masses.Marland Kitchen Psychiatric Judgement and insight Intact.. No evidence of depression, anxiety, or agitation.. Notes the patient is in a much better frame of mind today and the area of ulceration on her left heel is now closed with no open ulceration. The tenderness is also much less today. Electronic Signature(s) Signed: 08/23/2015 1:57:11 PM By: [name], FACS Entered By: [name] on 08/23/2015 13:57:10 [name] ([id number]) -------------------------------------------------------------------------------- Physician Orders Details Patient Name: [name], [name] 08/23/2015 1:30 Date of Service: PM Medical Record [id number] Number: Patient Account Number: [id number] Aug 16, 1935 (80 y.o. Treating RN: Macarthur Critchley Date of Birth/Sex: Female) Other Clinician: Primary Care Physician: Halina Maidens Treating [name] Referring Physician: Halina Maidens Physician/Extender: Suella Grove in Treatment: 4 Verbal / Phone Orders:  Yes Clinician: Macarthur Critchley Read Back and Verified: Yes Diagnosis Coding Wound Cleansing Wound #1 Left Calcaneus o Cleanse wound with mild soap and water Primary Wound Dressing Wound #1 Left Calcaneus o Other: - heel protector Follow-up Appointments Wound #1 Left Calcaneus o Return Appointment in 1 week. Off-Loading Wound #1 Left Calcaneus o Other: - keep pressure off heel Home Health Wound #1 Left Calcaneus o Continue Home Health Visits - [place] Nurse may visit PRN to address patientos wound care needs. o FACE TO FACE ENCOUNTER: MEDICARE and MEDICAID PATIENTS: I certify that this patient is under my care and that I had a face-to-face encounter that meets the physician face-to-face encounter requirements with this patient on this date. The encounter with the patient was in whole or in part for the following MEDICAL CONDITION: (primary reason for [place]) MEDICAL NECESSITY: I certify, that based on my findings, NURSING services are a medically necessary home health service. HOME BOUND STATUS: I certify that my clinical findings support that this patient is homebound (i.e., Due to illness or injury, pt requires aid of supportive devices such as crutches, cane, wheelchairs, walkers, the use of special transportation or the assistance of another person to leave their place of residence. There is a normal inability to leave the home and doing so requires considerable and taxing effort. Other absences are for medical reasons / religious services and are infrequent or of short duration when for other reasons). [name] ([id number]) o If current dressing causes regression in wound condition, may D/C ordered dressing product/s and apply Normal Saline Moist Dressing daily until next [place] / Other MD appointment. [place] of regression in wound condition at [phone]. o Please direct any NON-WOUND  related issues/requests for orders to patient's Primary Care Physician Electronic Signature(s) Signed: 08/23/2015 2:46:25 PM By: Ardean Larsen Signed: 08/23/2015 4:11:43 PM By: [name], FACS Entered By: Rebecca Eaton RN, [place] on 08/23/2015 13:54:51 [name] ([id number]) -------------------------------------------------------------------------------- Problem List Details Patient Name: [name], [name] 08/23/2015 1:30 Date of Service: PM Medical Record [id number] Number: Patient Account Number: [id number] 1935/10/04 (80 y.o. Treating RN: Macarthur Critchley Date of Birth/Sex: Female) Other Clinician: Primary Care Physician: Halina Maidens Treating [name] Referring Physician: Halina Maidens Physician/Extender: Suella Grove in Treatment:  4 Active Problems ICD-10 Encounter Code Description Active Date Diagnosis E11.621 Type 2 diabetes mellitus with foot ulcer 07/23/2015 Yes L89.623 Pressure ulcer of left heel, stage 3 07/23/2015 Yes Z79.01 Long term (current) use of anticoagulants 07/23/2015 Yes Inactive Problems Resolved Problems Electronic Signature(s) Signed: 08/23/2015 1:55:18 PM By: [name], FACS Entered By: [name] on 08/23/2015 13:55:17 [name] ([id number]) -------------------------------------------------------------------------------- Progress Note Details Patient Name: [name] 08/23/2015 1:30 Date of Service: PM Medical Record [id number] Number: Patient Account Number: [id number] 1935/07/09 (80 y.o. Treating RN: Macarthur Critchley Date of Birth/Sex: Female) Other Clinician: Primary Care Physician: Halina Maidens Treating [name] Referring Physician: Halina Maidens Physician/Extender: Suella Grove in Treatment: 4 Subjective Chief Complaint Information obtained from Patient Patients presents for treatment of an open diabetic ulcer which is also sure related and has been there for about a month History of Present  Illness (HPI) The following HPI elements were documented for the patient's wound: Location: left posterior heel Quality: Patient reports experiencing a sharp pain to affected area(s). Severity: Patient states wound (s) are getting better. Duration: Patient has had the wound for < 4 weeks prior to presenting for treatment Timing: Pain in wound is Intermittent (comes and goes Context: The wound appeared gradually over time Modifying Factors: Consults to this date include:was seen by her PCP and put on some antibiotics a while ago Associated Signs and Symptoms: Patient reports having difficulty standing for long periods. 80 year old patient referred to [place] by her PCP Dr. Halina Maidens for a pressure ulcer to the left heel which she's had for about a month. She has a past medical history of COPD, diabetes mellitus type 2 with chronic kidney disease, multi-infarct dementia, anxiety, urinary retention, paroxysmal atrial fibrillation. She has also had moderate malnutrition, multi-infarct dementia, congestive heart failure, left displaced femoral neck fracture, systemic lupus erythematosus. he is also status total abdominal hysterectomy with bilateral salpingo-oophorectomy, EGDs, hip arthroplasty on the left, cataract surgery. She has been a from a smoker and quit in May 2013 and she smoked for about 40 years. recently admitted to [facility] between January 19 and 07/03/2015. x-ray of the left foot done on 07/02/2015 showed no acute abnormality. the dry ulcer on the left heel was treated with Keflex and in the past but there was no specific treatment given and there was no drainage. 08/02/2015 -- patient says this heel is extremely tender and last week after her debridement was done she had a lot of tenderness and compares it to worse than childbirth and kidney stones. 08/23/2015 -- the patient has not been seen here for about 3 weeks but is in a much better state of  mind today and seems to be having less pain. He has a caregiver today who says the patient was down with significant upper respiratory infection. ZALYNN, SIZEMORE ([id number]) Objective Constitutional Pulse regular. Respirations normal and unlabored. Afebrile. Vitals Time Taken: 1:41 PM, Height: 64 in, Weight: 124 lbs, BMI: 21.3, Temperature: 98.3 F, Pulse: 63 bpm, Blood Pressure: 141/58 mmHg. Eyes Nonicteric. Reactive to light. Ears, Nose, Mouth, and Throat Lips, teeth, and gums WNL.Marland Kitchen Moist mucosa without lesions. Neck supple and nontender. No palpable supraclavicular or cervical adenopathy. Normal sized without goiter. Respiratory WNL. No retractions.. Cardiovascular Pedal Pulses WNL. No clubbing, cyanosis or edema. Chest Breasts symmetical and no nipple discharge.. Breast tissue WNL, no masses, lumps, or tenderness.. Lymphatic No adneopathy. No adenopathy. No adenopathy. Musculoskeletal Adexa without tenderness or enlargement.. Digits and nails w/o clubbing, cyanosis,  infection, petechiae, ischemia, or inflammatory conditions.Marland Kitchen Psychiatric Judgement and insight Intact.. No evidence of depression, anxiety, or agitation.. General Notes: the patient is in a much better frame of mind today and the area of ulceration on her left heel is now closed with no open ulceration. The tenderness is also much less today. Integumentary (Hair, Skin) No suspicious lesions. No crepitus or fluctuance. No peri-wound warmth or erythema. No masses.Marland Kitchen EDIA, CIRIGLIANO ([id number]) Wound #1 status is Open. Original cause of wound was Pressure Injury. The wound is located on the Left Calcaneus. The wound measures 0.2cm length x 0.2cm width x 0.1cm depth; 0.031cm^2 area and 0.003cm^3 volume. The wound is limited to skin breakdown. There is no tunneling or undermining noted. There is a none present amount of drainage noted. The wound margin is flat and intact. There is no granulation within the wound  bed. There is no necrotic tissue within the wound bed. The periwound skin appearance exhibited: Callus, Dry/Scaly. The periwound skin appearance did not exhibit: Crepitus, Excoriation, Fluctuance, Friable, Induration, Localized Edema, Rash, Scarring, Maceration, Moist, Atrophie Blanche, Cyanosis, Ecchymosis, Hemosiderin Staining, Mottled, Pallor, Rubor, Erythema. The periwound has tenderness on palpation. Assessment Active Problems ICD-10 E11.621 - Type 2 diabetes mellitus with foot ulcer L89.623 - Pressure ulcer of left heel, stage 3 Z79.01 - Long term (current) use of anticoagulants Plan Wound Cleansing: Wound #1 Left Calcaneus: Cleanse wound with mild soap and water Primary Wound Dressing: Wound #1 Left Calcaneus: Other: - heel protector Follow-up Appointments: Wound #1 Left Calcaneus: Return Appointment in 1 week. Off-Loading: Wound #1 Left Calcaneus: Other: - keep pressure off heel Home Health: Wound #1 Left Calcaneus: Continue Home Health Visits - [place] Nurse may visit PRN to address patient s wound care needs. FACE TO FACE ENCOUNTER: MEDICARE and MEDICAID PATIENTS: I certify that this patient is under my care and that I had a face-to-face encounter that meets the physician face-to-face encounter requirements with this patient on this date. The encounter with the patient was in whole or in part for the following MEDICAL CONDITION: (primary reason for [place]) MEDICAL NECESSITY: I certify, that based on my findings, NURSING services are a medically necessary home health service. HOME JANAEA, [name] ([id number]) BOUND STATUS: I certify that my clinical findings support that this patient is homebound (i.e., Due to illness or injury, pt requires aid of supportive devices such as crutches, cane, wheelchairs, walkers, the use of special transportation or the assistance of another person to leave their place of residence. There is a normal inability to  leave the home and doing so requires considerable and taxing effort. Other absences are for medical reasons / religious services and are infrequent or of short duration when for other reasons). If current dressing causes regression in wound condition, may D/C ordered dressing product/s and apply Normal Saline Moist Dressing daily until next [place] / Other MD appointment. [place] of regression in wound condition at [phone]. Please direct any NON-WOUND related issues/requests for orders to patient's Primary Care Physician I have also recommended: 1. Constant off loading and this has been discussed in great detail with the patient and her caregiver 2. Good control of her diabetes mellitus 3. Proper nutritional support with protein and also vitamin C and zinc. 4. heel protector to be applied and constant offloading discussed with her. 5. If the wound remains dry and there is no further issues B will probably discharge her from the wound care services the next  week Electronic Signature(s) Signed: 08/23/2015 4:13:28 PM By: [name], FACS Previous Signature: 08/23/2015 1:58:07 PM Version By: [name], FACS Entered By: [name] on 08/23/2015 16:13:28 [name] ([id number]) -------------------------------------------------------------------------------- SuperBill Details Patient Name: [name] Date of Service: 08/23/2015 Medical Record Number: [id number] Patient Account Number: [id number] Date of Birth/Sex: Sep 17, 1935 (80 y.o. Female) Treating RN: Macarthur Critchley Primary Care Physician: Halina Maidens Other Clinician: Referring Physician: Halina Maidens Treating Physician/Extender: Frann Rider in Treatment: 4 Diagnosis Coding ICD-10 Codes Code Description [phone] Type 2 diabetes mellitus with foot ulcer L89.623 Pressure ulcer of left heel, stage 3 Z79.01 Long term (current) use of anticoagulants Facility  Procedures CPT4 Code: [id number] Description: 99213 - WOUND CARE VISIT-LEV 3 EST PT Modifier: Quantity: 1 Physician Procedures CPT4 Code: [id number] Description: [id number] - WC PHYS LEVEL 3 - EST PT ICD-10 Description Diagnosis E11.621 Type 2 diabetes mellitus with foot ulcer L89.623 Pressure ulcer of left heel, stage 3 Z79.01 Long term (current) use of anticoagulants Modifier: Quantity: 1 Electronic Signature(s) Signed: 08/23/2015 2:46:25 PM By: Ardean Larsen Signed: 08/23/2015 4:11:43 PM By: [name], FACS Previous Signature: 08/23/2015 1:58:25 PM Version By: [name], FACS Entered By: Rebecca Eaton RN, [place] on 08/23/2015 14:06:43

## 2015-08-25 DIAGNOSIS — I5032 Chronic diastolic (congestive) heart failure: Secondary | ICD-10-CM | POA: Diagnosis not present

## 2015-08-25 DIAGNOSIS — L89620 Pressure ulcer of left heel, unstageable: Secondary | ICD-10-CM | POA: Diagnosis not present

## 2015-08-25 DIAGNOSIS — E1140 Type 2 diabetes mellitus with diabetic neuropathy, unspecified: Secondary | ICD-10-CM | POA: Diagnosis not present

## 2015-08-25 DIAGNOSIS — J449 Chronic obstructive pulmonary disease, unspecified: Secondary | ICD-10-CM | POA: Diagnosis not present

## 2015-08-25 DIAGNOSIS — R339 Retention of urine, unspecified: Secondary | ICD-10-CM | POA: Diagnosis not present

## 2015-08-25 DIAGNOSIS — F0390 Unspecified dementia without behavioral disturbance: Secondary | ICD-10-CM | POA: Diagnosis not present

## 2015-08-30 ENCOUNTER — Ambulatory Visit: Payer: Medicare Other | Admitting: Surgery

## 2015-09-01 DIAGNOSIS — J449 Chronic obstructive pulmonary disease, unspecified: Secondary | ICD-10-CM | POA: Diagnosis not present

## 2015-09-01 DIAGNOSIS — E1140 Type 2 diabetes mellitus with diabetic neuropathy, unspecified: Secondary | ICD-10-CM | POA: Diagnosis not present

## 2015-09-01 DIAGNOSIS — F0390 Unspecified dementia without behavioral disturbance: Secondary | ICD-10-CM | POA: Diagnosis not present

## 2015-09-01 DIAGNOSIS — R339 Retention of urine, unspecified: Secondary | ICD-10-CM | POA: Diagnosis not present

## 2015-09-01 DIAGNOSIS — I5032 Chronic diastolic (congestive) heart failure: Secondary | ICD-10-CM | POA: Diagnosis not present

## 2015-09-01 DIAGNOSIS — L89620 Pressure ulcer of left heel, unstageable: Secondary | ICD-10-CM | POA: Diagnosis not present

## 2015-09-03 DIAGNOSIS — Z9981 Dependence on supplemental oxygen: Secondary | ICD-10-CM | POA: Diagnosis not present

## 2015-09-03 DIAGNOSIS — Z87891 Personal history of nicotine dependence: Secondary | ICD-10-CM | POA: Diagnosis not present

## 2015-09-03 DIAGNOSIS — R339 Retention of urine, unspecified: Secondary | ICD-10-CM | POA: Diagnosis not present

## 2015-09-03 DIAGNOSIS — J449 Chronic obstructive pulmonary disease, unspecified: Secondary | ICD-10-CM | POA: Diagnosis not present

## 2015-09-03 DIAGNOSIS — E1140 Type 2 diabetes mellitus with diabetic neuropathy, unspecified: Secondary | ICD-10-CM | POA: Diagnosis not present

## 2015-09-03 DIAGNOSIS — I5032 Chronic diastolic (congestive) heart failure: Secondary | ICD-10-CM | POA: Diagnosis not present

## 2015-09-03 DIAGNOSIS — I480 Paroxysmal atrial fibrillation: Secondary | ICD-10-CM | POA: Diagnosis not present

## 2015-09-03 DIAGNOSIS — F0390 Unspecified dementia without behavioral disturbance: Secondary | ICD-10-CM | POA: Diagnosis not present

## 2015-09-03 DIAGNOSIS — L89622 Pressure ulcer of left heel, stage 2: Secondary | ICD-10-CM | POA: Diagnosis not present

## 2015-09-03 DIAGNOSIS — M329 Systemic lupus erythematosus, unspecified: Secondary | ICD-10-CM | POA: Diagnosis not present

## 2015-09-03 DIAGNOSIS — F419 Anxiety disorder, unspecified: Secondary | ICD-10-CM | POA: Diagnosis not present

## 2015-09-06 ENCOUNTER — Other Ambulatory Visit: Payer: Self-pay | Admitting: Internal Medicine

## 2015-09-07 DIAGNOSIS — I5032 Chronic diastolic (congestive) heart failure: Secondary | ICD-10-CM | POA: Diagnosis not present

## 2015-09-07 DIAGNOSIS — J449 Chronic obstructive pulmonary disease, unspecified: Secondary | ICD-10-CM | POA: Diagnosis not present

## 2015-09-07 DIAGNOSIS — L89622 Pressure ulcer of left heel, stage 2: Secondary | ICD-10-CM | POA: Diagnosis not present

## 2015-09-07 DIAGNOSIS — E1140 Type 2 diabetes mellitus with diabetic neuropathy, unspecified: Secondary | ICD-10-CM | POA: Diagnosis not present

## 2015-09-07 DIAGNOSIS — F0390 Unspecified dementia without behavioral disturbance: Secondary | ICD-10-CM | POA: Diagnosis not present

## 2015-09-07 DIAGNOSIS — R339 Retention of urine, unspecified: Secondary | ICD-10-CM | POA: Diagnosis not present

## 2015-09-09 DIAGNOSIS — L89622 Pressure ulcer of left heel, stage 2: Secondary | ICD-10-CM | POA: Diagnosis not present

## 2015-09-09 DIAGNOSIS — I5032 Chronic diastolic (congestive) heart failure: Secondary | ICD-10-CM | POA: Diagnosis not present

## 2015-09-09 DIAGNOSIS — E1140 Type 2 diabetes mellitus with diabetic neuropathy, unspecified: Secondary | ICD-10-CM | POA: Diagnosis not present

## 2015-09-09 DIAGNOSIS — F0390 Unspecified dementia without behavioral disturbance: Secondary | ICD-10-CM | POA: Diagnosis not present

## 2015-09-09 DIAGNOSIS — R339 Retention of urine, unspecified: Secondary | ICD-10-CM | POA: Diagnosis not present

## 2015-09-09 DIAGNOSIS — J449 Chronic obstructive pulmonary disease, unspecified: Secondary | ICD-10-CM | POA: Diagnosis not present

## 2015-09-10 ENCOUNTER — Encounter: Payer: Medicare Other | Admitting: Surgery

## 2015-09-10 DIAGNOSIS — J449 Chronic obstructive pulmonary disease, unspecified: Secondary | ICD-10-CM | POA: Diagnosis not present

## 2015-09-10 DIAGNOSIS — L89622 Pressure ulcer of left heel, stage 2: Secondary | ICD-10-CM | POA: Diagnosis not present

## 2015-09-10 DIAGNOSIS — E1122 Type 2 diabetes mellitus with diabetic chronic kidney disease: Secondary | ICD-10-CM | POA: Diagnosis not present

## 2015-09-10 DIAGNOSIS — F0390 Unspecified dementia without behavioral disturbance: Secondary | ICD-10-CM | POA: Diagnosis not present

## 2015-09-10 DIAGNOSIS — E11621 Type 2 diabetes mellitus with foot ulcer: Secondary | ICD-10-CM | POA: Diagnosis not present

## 2015-09-10 DIAGNOSIS — Z7901 Long term (current) use of anticoagulants: Secondary | ICD-10-CM | POA: Diagnosis not present

## 2015-09-10 DIAGNOSIS — L89623 Pressure ulcer of left heel, stage 3: Secondary | ICD-10-CM | POA: Diagnosis not present

## 2015-09-11 NOTE — Progress Notes (Signed)
KELCY, ALLREAD ([id number]) Visit Report for 09/10/2015 Arrival Information Details Patient Name: [name], [name]. Date of Service: 09/10/2015 1:30 PM Medical Record Number: [id number] Patient Account Number: [id number] Date of Birth/Sex: 12-17-1935 (79 y.o. Female) Treating RN: Baruch Gouty, RN, BSN, [name] Primary Care Physician: Halina Maidens Other Clinician: Referring Physician: Halina Maidens Treating Physician/Extender: Frann Rider in Treatment: 7 Visit Information History Since Last Visit Added or deleted any medications: No Patient Arrived: Wheel Chair Any new allergies or adverse reactions: No Arrival Time: 13:27 Had a fall or experienced change in No Accompanied By: caregiver activities of daily living that may affect Transfer Assistance: None risk of falls: Patient Identification Verified: Yes Signs or symptoms of abuse/neglect since last No Secondary Verification Process Yes visito Completed: Hospitalized since last visit: No Patient Has Alerts: Yes Has Dressing in Place as Prescribed: Yes Patient Alerts: Patient on Blood Pain Present Now: No Thinner Electronic Signature(s) Signed: 09/10/2015 2:07:24 PM By: Regan Lemming BSN, RN Entered By: Regan Lemming on 09/10/2015 13:27:49 [name] ([id number]) -------------------------------------------------------------------------------- Clinic Level of Care Assessment Details Patient Name: [name] Date of Service: 09/10/2015 1:30 PM Medical Record Number: [id number] Patient Account Number: [id number] Date of Birth/Sex: [birth year] (79 y.o. Female) Treating RN: Afful, RN, BSN, [place] Primary Care Physician: Halina Maidens Other Clinician: Referring Physician: Halina Maidens Treating Physician/Extender: Frann Rider in Treatment: 7 Clinic Level of Care Assessment Items TOOL 4 Quantity Score []  - Use when only an EandM is performed on FOLLOW-UP visit 0 ASSESSMENTS - Nursing Assessment /  Reassessment X - Reassessment of Co-morbidities (includes updates in patient status) 1 10 X - Reassessment of Adherence to Treatment Plan 1 5 ASSESSMENTS - Wound and Skin Assessment / Reassessment X - Simple Wound Assessment / Reassessment - one wound 1 5 []  - Complex Wound Assessment / Reassessment - multiple wounds 0 []  - Dermatologic / Skin Assessment (not related to wound area) 0 ASSESSMENTS - Focused Assessment []  - Circumferential Edema Measurements - multi extremities 0 []  - Nutritional Assessment / Counseling / Intervention 0 []  - Lower Extremity Assessment (monofilament, tuning fork, pulses) 0 []  - Peripheral Arterial Disease Assessment (using hand held doppler) 0 ASSESSMENTS - Ostomy and/or Continence Assessment and Care []  - Incontinence Assessment and Management 0 []  - Ostomy Care Assessment and Management (repouching, etc.) 0 PROCESS - Coordination of Care []  - Simple Patient / Family Education for ongoing care 0 []  - Complex (extensive) Patient / Family Education for ongoing care 0 []  - Staff obtains Programmer, systems, Records, Test Results / Process Orders 0 []  - Staff telephones HHA, Nursing Homes / Clarify orders / etc 0 []  - Routine Transfer to another Facility (non-emergent condition) 0 BRYNDAL, BLANKENBAKER ([id number]) []  - Routine Hospital Admission (non-emergent condition) 0 []  - New Admissions / Biomedical engineer / Ordering NPWT, Apligraf, etc. 0 []  - Emergency Hospital Admission (emergent condition) 0 []  - Simple Discharge Coordination 0 []  - Complex (extensive) Discharge Coordination 0 PROCESS - Special Needs []  - Pediatric / Minor Patient Management 0 []  - Isolation Patient Management 0 []  - Hearing / Language / Visual special needs 0 []  - Assessment of Community assistance (transportation, D/C planning, etc.) 0 []  - Additional assistance / Altered mentation 0 []  - Support Surface(s) Assessment (bed, cushion, seat, etc.) 0 INTERVENTIONS - Wound Cleansing /  Measurement X - Simple Wound Cleansing - one wound 1 5 []  - Complex Wound Cleansing - multiple wounds 0 X - Wound Imaging (photographs - any number of  wounds) 1 5 X - Wound Tracing (instead of photographs) 1 5 X - Simple Wound Measurement - one wound 1 5 []  - Complex Wound Measurement - multiple wounds 0 INTERVENTIONS - Wound Dressings X - Small Wound Dressing one or multiple wounds 1 10 []  - Medium Wound Dressing one or multiple wounds 0 []  - Large Wound Dressing one or multiple wounds 0 []  - Application of Medications - topical 0 []  - Application of Medications - injection 0 INTERVENTIONS - Miscellaneous []  - External ear exam 0 KYNLEIGH, BONN ([id number]) []  - Specimen Collection (cultures, biopsies, blood, body fluids, etc.) 0 []  - Specimen(s) / Culture(s) sent or taken to Lab for analysis 0 []  - Patient Transfer (multiple staff / Harrel Lemon Lift / Similar devices) 0 []  - Simple Staple / Suture removal (25 or less) 0 []  - Complex Staple / Suture removal (26 or more) 0 []  - Hypo / Hyperglycemic Management (close monitor of Blood Glucose) 0 []  - Ankle / Brachial Index (ABI) - do not check if billed separately 0 X - Vital Signs 1 5 Has the patient been seen at the hospital within the last three years: Yes Total Score: 55 Level Of Care: New/Established - Level 2 Electronic Signature(s) Signed: 09/10/2015 2:07:24 PM By: Regan Lemming BSN, RN Entered By: Regan Lemming on 09/10/2015 13:43:11 [name] ([id number]) -------------------------------------------------------------------------------- Encounter Discharge Information Details Patient Name: [name] Date of Service: 09/10/2015 1:30 PM Medical Record Number: [id number] Patient Account Number: [id number] Date of Birth/Sex: 04-06-1936 (79 y.o. Female) Treating RN: Baruch Gouty, RN, BSN, [name] Primary Care Physician: Halina Maidens Other Clinician: Referring Physician: Halina Maidens Treating Physician/Extender: Frann Rider in Treatment: 7 Encounter Discharge Information Items Discharge Pain Level: 0 Discharge Condition: Stable Ambulatory Status: Wheelchair Discharge Destination: Nursing Home Transportation: Other Accompanied By: caregiver Schedule Follow-up Appointment: No Medication Reconciliation completed and provided to Patient/Care No Ira Dougher: Provided on Clinical Summary of Care: 09/10/2015 Form Type Recipient Paper Patient LK Electronic Signature(s) Signed: 09/10/2015 1:48:16 PM By: Ruthine Dose Entered By: Ruthine Dose on 09/10/2015 13:48:16 [name] ([id number]) -------------------------------------------------------------------------------- Lower Extremity Assessment Details Patient Name: [name] Date of Service: 09/10/2015 1:30 PM Medical Record Number: [id number] Patient Account Number: [id number] Date of Birth/Sex: 1936-04-03 (79 y.o. Female) Treating RN: Baruch Gouty, RN, BSN, [name] Primary Care Physician: Halina Maidens Other Clinician: Referring Physician: Halina Maidens Treating Physician/Extender: Frann Rider in Treatment: 7 Vascular Assessment Pulses: Posterior Tibial Dorsalis Pedis Palpable: [Left:Yes] Extremity colors, hair growth, and conditions: Extremity Color: [Left:Normal] Hair Growth on Extremity: [Left:No] Temperature of Extremity: [Left:Warm] Capillary Refill: [Left:< 3 seconds] Toe Nail Assessment Left: Right: Thick: No Discolored: No Deformed: No Improper Length and Hygiene: No Electronic Signature(s) Signed: 09/10/2015 1:36:19 PM By: Regan Lemming BSN, RN Entered By: Regan Lemming on 09/10/2015 13:36:19 [name] ([id number]) -------------------------------------------------------------------------------- Multi Wound Chart Details Patient Name: [name] Date of Service: 09/10/2015 1:30 PM Medical Record Number: [id number] Patient Account Number: [id number] Date of Birth/Sex: [birth year] (79 y.o.  Female) Treating RN: Baruch Gouty, RN, BSN, [name] Primary Care Physician: Halina Maidens Other Clinician: Referring Physician: Halina Maidens Treating Physician/Extender: Frann Rider in Treatment: 7 Vital Signs Height(in): 64 Pulse(bpm): 54 Weight(lbs): 124 Blood Pressure 146/57 (mmHg): Body Mass Index(BMI): 21 Temperature(F): 98 Respiratory Rate 18 (breaths/min): Photos: [1:No Photos] [N/A:N/A] Wound Location: [1:Left Calcaneus] [N/A:N/A] Wounding Event: [1:Pressure Injury] [N/A:N/A] Primary Etiology: [1:Pressure Ulcer] [N/A:N/A] Comorbid History: [1:Chronic Obstructive Pulmonary Disease (COPD), Angina, Congestive Heart Failure, Type II Diabetes, Lupus Erythematosus] [N/A:N/A]  Date Acquired: [1:06/21/2015] [N/A:N/A] Weeks of Treatment: [1:7] [N/A:N/A] Wound Status: [1:Open] [N/A:N/A] Measurements L x W x D 0.1x0.1x0.1 [N/A:N/A] (cm) Area (cm) : [1:0.008] [N/A:N/A] Volume (cm) : [1:0.001] [N/A:N/A] % Reduction in Area: [1:99.30%] [N/A:N/A] % Reduction in Volume: 99.20% [N/A:N/A] Classification: [1:Category/Stage II] [N/A:N/A] HBO Classification: [1:Grade 1] [N/A:N/A] Exudate Amount: [1:None Present] [N/A:N/A] Wound Margin: [1:Flat and Intact] [N/A:N/A] Granulation Amount: [1:None Present (0%)] [N/A:N/A] Necrotic Amount: [1:None Present (0%)] [N/A:N/A] Exposed Structures: [1:Fascia: No Fat: No Tendon: No Muscle: No Joint: No] [N/A:N/A] Bone: No Limited to Skin Breakdown Epithelialization: None N/A N/A Periwound Skin Texture: Callus: Yes N/A N/A Edema: No Excoriation: No Induration: No Crepitus: No Fluctuance: No Friable: No Rash: No Scarring: No Periwound Skin Dry/Scaly: Yes N/A N/A Moisture: Maceration: No Moist: No Periwound Skin Color: Atrophie Blanche: No N/A N/A Cyanosis: No Ecchymosis: No Erythema: No Hemosiderin Staining: No Mottled: No Pallor: No Rubor: No Tenderness on Yes N/A N/A Palpation: Wound Preparation: Ulcer Cleansing: N/A  N/A Rinsed/Irrigated with Saline Topical Anesthetic Applied: Other: lidocaine 4% Treatment Notes Electronic Signature(s) Signed: 09/10/2015 2:07:24 PM By: Regan Lemming BSN, RN Entered By: Regan Lemming on 09/10/2015 13:42:06 [name] ([id number]) -------------------------------------------------------------------------------- [place] Details Patient Name: [name] Date of Service: 09/10/2015 1:30 PM Medical Record Number: [id number] Patient Account Number: [id number] Date of Birth/Sex: 1935-10-04 (79 y.o. Female) Treating RN: Baruch Gouty, RN, BSN, [name] Primary Care Physician: Halina Maidens Other Clinician: Referring Physician: Halina Maidens Treating Physician/Extender: Frann Rider in Treatment: 7 Active Inactive Abuse / Safety / Falls / Self Care Management Nursing Diagnoses: Impaired physical mobility Potential for falls Goals: Patient will remain injury free Date Initiated: 07/23/2015 Goal Status: Active Interventions: Assess fall risk on admission and as needed Notes: Orientation to the Wound Care Program Nursing Diagnoses: Knowledge deficit related to the wound healing center program Goals: Patient/caregiver will verbalize understanding of the [place] Program Date Initiated: 07/23/2015 Goal Status: Active Interventions: Provide education on orientation to the wound center Notes: Pressure Nursing Diagnoses: Knowledge deficit related to causes and risk factors for pressure ulcer development Goals: Patient will remain free of pressure ulcers DONNAJEAN, TAPP ([id number]) Date Initiated: 07/23/2015 Goal Status: Active Interventions: Assess potential for pressure ulcer upon admission and as needed Notes: Wound/Skin Impairment Nursing Diagnoses: Impaired tissue integrity Goals: Patient/caregiver will verbalize understanding of skin care regimen Date Initiated: 07/23/2015 Goal Status: Active Ulcer/skin  breakdown will have a volume reduction of 30% by week 4 Date Initiated: 07/23/2015 Goal Status: Active Ulcer/skin breakdown will have a volume reduction of 50% by week 8 Date Initiated: 07/23/2015 Goal Status: Active Ulcer/skin breakdown will have a volume reduction of 80% by week 12 Date Initiated: 07/23/2015 Goal Status: Active Ulcer/skin breakdown will heal within 14 weeks Date Initiated: 07/23/2015 Goal Status: Active Interventions: Assess ulceration(s) every visit Notes: Electronic Signature(s) Signed: 09/10/2015 2:07:24 PM By: Regan Lemming BSN, RN Entered By: Regan Lemming on 09/10/2015 13:42:00 [name] ([id number]) -------------------------------------------------------------------------------- Pain Assessment Details Patient Name: [name] Date of Service: 09/10/2015 1:30 PM Medical Record Number: [id number] Patient Account Number: [id number] Date of Birth/Sex: 1935-08-14 (79 y.o. Female) Treating RN: Baruch Gouty, RN, BSN, [name] Primary Care Physician: Halina Maidens Other Clinician: Referring Physician: Halina Maidens Treating Physician/Extender: Frann Rider in Treatment: 7 Active Problems Location of Pain Severity and Description of Pain Patient Has Paino No Site Locations Pain Management and Medication Current Pain Management: Electronic Signature(s) Signed: 09/10/2015 2:07:24 PM By: Regan Lemming BSN, RN Entered By: Regan Lemming on  09/10/2015 13:28:01 [name] ([id number]) -------------------------------------------------------------------------------- Patient/Caregiver Education Details Patient Name: [name] Date of Service: 09/10/2015 1:30 PM Medical Record Number: [id number] Patient Account Number: [id number] Date of Birth/Gender: January 02, 1936 (80 y.o. Female) Treating RN: Baruch Gouty, RN, BSN, [name] Primary Care Physician: Halina Maidens Other Clinician: Referring Physician: Halina Maidens Treating Physician/Extender: Frann Rider in Treatment: 7 Education Assessment Education Provided To: Patient Education Topics Provided Welcome To [facility]: Methods: Explain/Verbal Responses: State content correctly Electronic Signature(s) Signed: 09/10/2015 2:07:24 PM By: Regan Lemming BSN, RN Entered By: Regan Lemming on 09/10/2015 13:47:02 [name] ([id number]) -------------------------------------------------------------------------------- Wound Assessment Details Patient Name: [name] Date of Service: 09/10/2015 1:30 PM Medical Record Number: [id number] Patient Account Number: [id number] Date of Birth/Sex: [birth year] (79 y.o. Female) Treating RN: Afful, RN, BSN, [place] Primary Care Physician: Halina Maidens Other Clinician: Referring Physician: Halina Maidens Treating Physician/Extender: Frann Rider in Treatment: 7 Wound Status Wound Number: 1 Primary Pressure Ulcer Etiology: Wound Location: Left Calcaneus Wound Open Wounding Event: Pressure Injury Status: Date Acquired: 06/21/2015 Comorbid Chronic Obstructive Pulmonary Disease Weeks Of Treatment: 7 History: (COPD), Angina, Congestive Heart Clustered Wound: No Failure, Type II Diabetes, Lupus Erythematosus Photos Photo Uploaded By: Regan Lemming on 09/10/2015 15:37:50 Wound Measurements Length: (cm) 0.1 Width: (cm) 0.1 Depth: (cm) 0.1 Area: (cm) 0.008 Volume: (cm) 0.001 % Reduction in Area: 99.3% % Reduction in Volume: 99.2% Epithelialization: None Tunneling: No Undermining: No Wound Description Classification: Category/Stage II Foul Odor A Diabetic Severity (Wagner): Grade 1 Wound Margin: Flat and Intact Exudate Amount: None Present fter Cleansing: No Wound Bed Granulation Amount: None Present (0%) Exposed Structure Necrotic Amount: None Present (0%) Fascia Exposed: No Fat Layer Exposed: No Tendon Exposed: No DASHANAY, PITKIN ([id number]) Muscle Exposed: No Joint Exposed: No Bone Exposed:  No Limited to Skin Breakdown Periwound Skin Texture Texture Color No Abnormalities Noted: No No Abnormalities Noted: No Callus: Yes Atrophie Blanche: No Crepitus: No Cyanosis: No Excoriation: No Ecchymosis: No Fluctuance: No Erythema: No Friable: No Hemosiderin Staining: No Induration: No Mottled: No Localized Edema: No Pallor: No Rash: No Rubor: No Scarring: No Temperature / Pain Moisture Tenderness on Palpation: Yes No Abnormalities Noted: No Dry / Scaly: Yes Maceration: No Moist: No Wound Preparation Ulcer Cleansing: Rinsed/Irrigated with Saline Topical Anesthetic Applied: Other: lidocaine 4%, Treatment Notes Wound #1 (Left Calcaneus) 1. Cleansed with: Clean wound with Normal Saline 4. Dressing Applied: Prisma Ag 5. Secondary Dressing Applied Bordered Foam Dressing Notes offloading pad (felt) Electronic Signature(s) Signed: 09/10/2015 2:07:24 PM By: Regan Lemming BSN, RN Previous Signature: 09/10/2015 1:36:48 PM Version By: Regan Lemming BSN, RN Entered By: Regan Lemming on 09/10/2015 13:41:52 [name] ([id number]) -------------------------------------------------------------------------------- [place] Details Patient Name: [name] Date of Service: 09/10/2015 1:30 PM Medical Record Number: [id number] Patient Account Number: [id number] Date of Birth/Sex: 1936-03-30 (79 y.o. Female) Treating RN: Afful, RN, BSN, [place] Primary Care Physician: Halina Maidens Other Clinician: Referring Physician: Halina Maidens Treating Physician/Extender: Frann Rider in Treatment: 7 Vital Signs Time Taken: 13:31 Temperature (F): 98 Height (in): 64 Pulse (bpm): 54 Weight (lbs): 124 Respiratory Rate (breaths/min): 18 Body Mass Index (BMI): 21.3 Blood Pressure (mmHg): 146/57 Reference Range: 80 - 120 mg / dl Electronic Signature(s) Signed: 09/10/2015 2:07:24 PM By: Regan Lemming BSN, RN Entered By: Regan Lemming on 09/10/2015 13:31:09

## 2015-09-11 NOTE — Progress Notes (Signed)
TENEQUA, CONSTANCE ([id number]) Visit Report for 09/10/2015 Chief Complaint Document Details Patient Name: [name], DRESSMAN. Date of Service: 09/10/2015 1:30 PM Medical Record Patient Account Number: [id number] Number: Afful, RN, BSN, Treating RN: May 07, 1936 (80 y.o. [name] Date of Birth/Sex: Female) Other Clinician: Primary Care Physician: Halina Maidens Treating [name] Referring Physician: Halina Maidens Physician/Extender: Suella Grove in Treatment: 7 Information Obtained from: Patient Chief Complaint Patients presents for treatment of an open diabetic ulcer which is also sure related and has been there for about a month Electronic Signature(s) Signed: 09/10/2015 2:05:22 PM By: [name], FACS Entered By: [name] on 09/10/2015 14:05:22 [name] ([id number]) -------------------------------------------------------------------------------- HPI Details Patient Name: [name] Date of Service: 09/10/2015 1:30 PM Medical Record Patient Account Number: [id number] Number: Afful, RN, BSN, Treating RN: 1936/02/09 (80 y.o. [name] Date of Birth/Sex: Female) Other Clinician: Primary Care Physician: Halina Maidens Treating [name] Referring Physician: Halina Maidens Physician/Extender: Suella Grove in Treatment: 7 History of Present Illness Location: left posterior heel Quality: Patient reports experiencing a sharp pain to affected area(s). Severity: Patient states wound (s) are getting better. Duration: Patient has had the wound for < 4 weeks prior to presenting for treatment Timing: Pain in wound is Intermittent (comes and goes Context: The wound appeared gradually over time Modifying Factors: Consults to this date include:was seen by her PCP and put on some antibiotics a while ago Associated Signs and Symptoms: Patient reports having difficulty standing for long periods. HPI Description: 80 year old patient referred to [place] by her PCP  Dr. Halina Maidens for a pressure ulcer to the left heel which she's had for about a month. She has a past medical history of COPD, diabetes mellitus type 2 with chronic kidney disease, multi-infarct dementia, anxiety, urinary retention, paroxysmal atrial fibrillation. She has also had moderate malnutrition, multi-infarct dementia, congestive heart failure, left displaced femoral neck fracture, systemic lupus erythematosus. he is also status total abdominal hysterectomy with bilateral salpingo-oophorectomy, EGDs, hip arthroplasty on the left, cataract surgery. She has been a from a smoker and quit in May 2013 and she smoked for about 40 years. recently admitted to [facility] between January 19 and 07/03/2015. x-ray of the left foot done on 07/02/2015 showed no acute abnormality. the dry ulcer on the left heel was treated with Keflex and in the past but there was no specific treatment given and there was no drainage. 08/02/2015 -- patient says this heel is extremely tender and last week after her debridement was done she had a lot of tenderness and compares it to worse than childbirth and kidney stones. 08/23/2015 -- the patient has not been seen here for about 3 weeks but is in a much better state of mind today and seems to be having less pain. He has a caregiver today who says the patient was down with significant upper respiratory infection. Electronic Signature(s) Signed: 09/10/2015 2:05:28 PM By: [name], FACS Entered By: [name] on 09/10/2015 14:05:28 [name] ([id number]) -------------------------------------------------------------------------------- Physical Exam Details Patient Name: [name] Date of Service: 09/10/2015 1:30 PM Medical Record Patient Account Number: [id number] Number: Afful, RN, BSN, Treating RN: 12-27-1935 (80 y.o. [name] Date of Birth/Sex: Female) Other Clinician: Primary Care Physician: Halina Maidens Treating [name] Referring Physician: Halina Maidens Physician/Extender: Suella Grove in Treatment: 7 Constitutional . Pulse regular. Respirations normal and unlabored. Afebrile. . Eyes Nonicteric. Reactive to light. Ears, Nose, Mouth, and Throat Lips, teeth, and gums WNL.Marland Kitchen Moist mucosa without  lesions. Neck supple and nontender. No palpable supraclavicular or cervical adenopathy. Normal sized without goiter. Respiratory WNL. No retractions.. Breath sounds WNL, No rubs, rales, rhonchi, or wheeze.. Cardiovascular Heart rhythm and rate regular, no murmur or gallop.. Pedal Pulses WNL. No clubbing, cyanosis or edema. Lymphatic No adneopathy. No adenopathy. No adenopathy. Musculoskeletal Adexa without tenderness or enlargement.. Digits and nails w/o clubbing, cyanosis, infection, petechiae, ischemia, or inflammatory conditions.. Integumentary (Hair, Skin) No suspicious lesions. No crepitus or fluctuance. No peri-wound warmth or erythema. No masses.Marland Kitchen Psychiatric Judgement and insight Intact.. No evidence of depression, anxiety, or agitation.. Notes the ulceration on her left heel is now down to a tiny open area and there is minimal drainage from this. Her tenderness is within normal limits. No debridement was required today. Electronic Signature(s) Signed: 09/10/2015 2:06:02 PM By: [name], FACS Entered By: [name] on 09/10/2015 14:06:01 [name] ([id number]) -------------------------------------------------------------------------------- Physician Orders Details Patient Name: [name] Date of Service: 09/10/2015 1:30 PM Medical Record Patient Account Number: [id number] Number: Afful, RN, BSN, Treating RN: [birth year] (80 y.o. [name] Date of Birth/Sex: Female) Other Clinician: Primary Care Physician: Halina Maidens Treating [name] Referring Physician: Halina Maidens Physician/Extender: Suella Grove in Treatment: 7 Verbal / Phone  Orders: Yes Clinician: Afful, RN, BSN, Rita Read Back and Verified: Yes Diagnosis Coding Wound Cleansing Wound #1 Left Calcaneus o Cleanse wound with mild soap and water Primary Wound Dressing Wound #1 Left Calcaneus o Prisma Ag Secondary Dressing Wound #1 Left Calcaneus o Boardered Foam Dressing Follow-up Appointments Wound #1 Left Calcaneus o Return Appointment in 1 week. Off-Loading Wound #1 Left Calcaneus o Other: - keep pressure off heel Home Health Wound #1 Left Calcaneus o Continue Home Health Visits - [place] Nurse may visit PRN to address patientos wound care needs. o FACE TO FACE ENCOUNTER: MEDICARE and MEDICAID PATIENTS: I certify that this patient is under my care and that I had a face-to-face encounter that meets the physician face-to-face encounter requirements with this patient on this date. The encounter with the patient was in whole or in part for the following MEDICAL CONDITION: (primary reason for [place]) MEDICAL NECESSITY: I certify, that based on my findings, NURSING services are a medically necessary home health service. HOME BOUND STATUS: I certify that my clinical findings support that this patient is homebound (i.e., Due to illness or injury, pt requires aid of supportive devices such as crutches, cane, wheelchairs, walkers, the use of special transportation or the assistance of another person to leave their place of residence. There is a normal inability to leave the home and doing so requires considerable and taxing effort. HARLEI, BAAL ([id number]) absences are for medical reasons / religious services and are infrequent or of short duration when for other reasons). o If current dressing causes regression in wound condition, may D/C ordered dressing product/s and apply Normal Saline Moist Dressing daily until next [place] / Other MD appointment. [place] of regression  in wound condition at [phone]. o Please direct any NON-WOUND related issues/requests for orders to patient's Primary Care Physician Electronic Signature(s) Signed: 09/10/2015 2:07:24 PM By: Regan Lemming BSN, RN Signed: 09/10/2015 4:02:38 PM By: [name], FACS Entered By: Regan Lemming on 09/10/2015 13:42:45 [name] ([id number]) -------------------------------------------------------------------------------- Problem List Details Patient Name: ESTELLAR, GRAFF. Date of Service: 09/10/2015 1:30 PM Medical Record Patient Account Number: [id number] Number: Afful, RN, BSN, Treating RN: May 18, 1936 (80 y.o. [name] Date  of Birth/Sex: Female) Other Clinician: Primary Care Physician: Halina Maidens Treating [name] Referring Physician: Halina Maidens Physician/Extender: Suella Grove in Treatment: 7 Active Problems ICD-10 Encounter Code Description Active Date Diagnosis E11.621 Type 2 diabetes mellitus with foot ulcer 07/23/2015 Yes L89.623 Pressure ulcer of left heel, stage 3 07/23/2015 Yes Z79.01 Long term (current) use of anticoagulants 07/23/2015 Yes Inactive Problems Resolved Problems Electronic Signature(s) Signed: 09/10/2015 2:05:15 PM By: [name], FACS Entered By: [name] on 09/10/2015 14:05:15 [name] ([id number]) -------------------------------------------------------------------------------- Progress Note Details Patient Name: [name] Date of Service: 09/10/2015 1:30 PM Medical Record Patient Account Number: [id number] Number: Afful, RN, BSN, Treating RN: 01-11-1936 (80 y.o. [name] Date of Birth/Sex: Female) Other Clinician: Primary Care Physician: Halina Maidens Treating [name] Referring Physician: Halina Maidens Physician/Extender: Suella Grove in Treatment: 7 Subjective Chief Complaint Information obtained from Patient Patients presents for treatment of an open diabetic ulcer which is also sure  related and has been there for about a month History of Present Illness (HPI) The following HPI elements were documented for the patient's wound: Location: left posterior heel Quality: Patient reports experiencing a sharp pain to affected area(s). Severity: Patient states wound (s) are getting better. Duration: Patient has had the wound for < 4 weeks prior to presenting for treatment Timing: Pain in wound is Intermittent (comes and goes Context: The wound appeared gradually over time Modifying Factors: Consults to this date include:was seen by her PCP and put on some antibiotics a while ago Associated Signs and Symptoms: Patient reports having difficulty standing for long periods. 80 year old patient referred to [place] by her PCP Dr. Halina Maidens for a pressure ulcer to the left heel which she's had for about a month. She has a past medical history of COPD, diabetes mellitus type 2 with chronic kidney disease, multi-infarct dementia, anxiety, urinary retention, paroxysmal atrial fibrillation. She has also had moderate malnutrition, multi-infarct dementia, congestive heart failure, left displaced femoral neck fracture, systemic lupus erythematosus. he is also status total abdominal hysterectomy with bilateral salpingo-oophorectomy, EGDs, hip arthroplasty on the left, cataract surgery. She has been a from a smoker and quit in May 2013 and she smoked for about 40 years. recently admitted to [facility] between January 19 and 07/03/2015. x-ray of the left foot done on 07/02/2015 showed no acute abnormality. the dry ulcer on the left heel was treated with Keflex and in the past but there was no specific treatment given and there was no drainage. 08/02/2015 -- patient says this heel is extremely tender and last week after her debridement was done she had a lot of tenderness and compares it to worse than childbirth and kidney stones. 08/23/2015 -- the patient has not been seen  here for about 3 weeks but is in a much better state of mind today and seems to be having less pain. He has a caregiver today who says the patient was down with significant upper respiratory infection. AZAYLEA, DEMBY ([id number]) Objective Constitutional Pulse regular. Respirations normal and unlabored. Afebrile. Vitals Time Taken: 1:31 PM, Height: 64 in, Weight: 124 lbs, BMI: 21.3, Temperature: 98 F, Pulse: 54 bpm, Respiratory Rate: 18 breaths/min, Blood Pressure: 146/57 mmHg. Eyes Nonicteric. Reactive to light. Ears, Nose, Mouth, and Throat Lips, teeth, and gums WNL.Marland Kitchen Moist mucosa without lesions. Neck supple and nontender. No palpable supraclavicular or cervical adenopathy. Normal sized without goiter. Respiratory WNL. No retractions.. Breath sounds WNL, No rubs, rales, rhonchi, or wheeze.. Cardiovascular Heart rhythm and rate regular, no  murmur or gallop.. Pedal Pulses WNL. No clubbing, cyanosis or edema. Lymphatic No adneopathy. No adenopathy. No adenopathy. Musculoskeletal Adexa without tenderness or enlargement.. Digits and nails w/o clubbing, cyanosis, infection, petechiae, ischemia, or inflammatory conditions.Marland Kitchen Psychiatric Judgement and insight Intact.. No evidence of depression, anxiety, or agitation.. General Notes: the ulceration on her left heel is now down to a tiny open area and there is minimal drainage from this. Her tenderness is within normal limits. No debridement was required today. Integumentary (Hair, Skin) No suspicious lesions. No crepitus or fluctuance. No peri-wound warmth or erythema. No masses.. Wound #1 status is Open. Original cause of wound was Pressure Injury. The wound is located on the Left Calcaneus. The wound measures 0.1cm length x 0.1cm width x 0.1cm depth; 0.008cm^2 area and 0.001cm^3 volume. The wound is limited to skin breakdown. There is no tunneling or undermining noted. [name] ([id number]) There is a none present amount of  drainage noted. The wound margin is flat and intact. There is no granulation within the wound bed. There is no necrotic tissue within the wound bed. The periwound skin appearance exhibited: Callus, Dry/Scaly. The periwound skin appearance did not exhibit: Crepitus, Excoriation, Fluctuance, Friable, Induration, Localized Edema, Rash, Scarring, Maceration, Moist, Atrophie Blanche, Cyanosis, Ecchymosis, Hemosiderin Staining, Mottled, Pallor, Rubor, Erythema. The periwound has tenderness on palpation. Assessment Active Problems ICD-10 E11.621 - Type 2 diabetes mellitus with foot ulcer L89.623 - Pressure ulcer of left heel, stage 3 Z79.01 - Long term (current) use of anticoagulants Plan Wound Cleansing: Wound #1 Left Calcaneus: Cleanse wound with mild soap and water Primary Wound Dressing: Wound #1 Left Calcaneus: Prisma Ag Secondary Dressing: Wound #1 Left Calcaneus: Boardered Foam Dressing Follow-up Appointments: Wound #1 Left Calcaneus: Return Appointment in 1 week. Off-Loading: Wound #1 Left Calcaneus: Other: - keep pressure off heel Home Health: Wound #1 Left Calcaneus: Continue Home Health Visits - [place] Nurse may visit PRN to address patient s wound care needs. FACE TO FACE ENCOUNTER: MEDICARE and MEDICAID PATIENTS: I certify that this patient is under my care and that I had a face-to-face encounter that meets the physician face-to-face encounter requirements with this patient on this date. The encounter with the patient was in whole or in part for the following MEDICAL CONDITION: (primary reason for [place]) MEDICAL NECESSITY: I certify, that based on my findings, NURSING services are a medically necessary home health service. HOME HAISLEE, EICHENBERG ([id number]) BOUND STATUS: I certify that my clinical findings support that this patient is homebound (i.e., Due to illness or injury, pt requires aid of supportive devices such as crutches, cane,  wheelchairs, walkers, the use of special transportation or the assistance of another person to leave their place of residence. There is a normal inability to leave the home and doing so requires considerable and taxing effort. Other absences are for medical reasons / religious services and are infrequent or of short duration when for other reasons). If current dressing causes regression in wound condition, may D/C ordered dressing product/s and apply Normal Saline Moist Dressing daily until next [place] / Other MD appointment. [place] of regression in wound condition at [phone]. Please direct any NON-WOUND related issues/requests for orders to patient's Primary Care Physician I have recommended: 1. Constant off loading and this has been discussed in great detail with the patient and her caregiver 2. some Prisma AG to be placed over this wound and have a bordered foam heel cup. 3. Proper nutritional support  with protein and also vitamin C and zinc. 4. heel protector to be applied and constant offloading discussed with her. 5. If the wound remains dry and there is no further issues she will probably be discharge from the wound care services the next week Electronic Signature(s) Signed: 09/10/2015 2:07:34 PM By: [name], FACS Entered By: [name] on 09/10/2015 14:07:34 [name] ([id number]) -------------------------------------------------------------------------------- SuperBill Details Patient Name: [name]. Date of Service: 09/10/2015 Medical Record Patient Account Number: [id number] Number: Afful, RN, BSN, Treating RN: 11-Jun-1936 (80 y.o. [name] Date of Birth/Sex: Female) Other Clinician: Primary Care Physician: Halina Maidens Treating [name] Referring Physician: Halina Maidens Physician/Extender: Suella Grove in Treatment: 7 Diagnosis Coding ICD-10 Codes Code Description E11.621 Type 2 diabetes  mellitus with foot ulcer L89.623 Pressure ulcer of left heel, stage 3 Z79.01 Long term (current) use of anticoagulants Facility Procedures CPT4 Code: [id number] Description: [id number] - WOUND CARE VISIT-LEV 2 EST PT Modifier: Quantity: 1 Physician Procedures CPT4 Code: [id number] Description: [id number] - WC PHYS LEVEL 3 - EST PT ICD-10 Description Diagnosis E11.621 Type 2 diabetes mellitus with foot ulcer L89.623 Pressure ulcer of left heel, stage 3 Z79.01 Long term (current) use of anticoagulants Modifier: Quantity: 1 Electronic Signature(s) Signed: 09/10/2015 2:07:57 PM By: [name], FACS Entered By: [name] on 09/10/2015 14:07:57

## 2015-09-13 ENCOUNTER — Encounter (INDEPENDENT_AMBULATORY_CARE_PROVIDER_SITE_OTHER): Payer: Medicare Other | Admitting: Internal Medicine

## 2015-09-13 DIAGNOSIS — L89622 Pressure ulcer of left heel, stage 2: Secondary | ICD-10-CM

## 2015-09-13 DIAGNOSIS — I5032 Chronic diastolic (congestive) heart failure: Secondary | ICD-10-CM

## 2015-09-13 DIAGNOSIS — R339 Retention of urine, unspecified: Secondary | ICD-10-CM

## 2015-09-13 DIAGNOSIS — F0150 Vascular dementia without behavioral disturbance: Secondary | ICD-10-CM

## 2015-09-13 NOTE — Progress Notes (Signed)
Patient ID: [name], female   DOB: [birth year], 80 y.o.   MRN: [id number]  Home health orders from [place]. Start of care 07/05/15.  Recertification of care 09/03/15 through 11/01/15.  Orders are reviewed, signed and faxed.

## 2015-09-15 DIAGNOSIS — F0390 Unspecified dementia without behavioral disturbance: Secondary | ICD-10-CM | POA: Diagnosis not present

## 2015-09-15 DIAGNOSIS — E1140 Type 2 diabetes mellitus with diabetic neuropathy, unspecified: Secondary | ICD-10-CM | POA: Diagnosis not present

## 2015-09-15 DIAGNOSIS — L89622 Pressure ulcer of left heel, stage 2: Secondary | ICD-10-CM | POA: Diagnosis not present

## 2015-09-15 DIAGNOSIS — J449 Chronic obstructive pulmonary disease, unspecified: Secondary | ICD-10-CM | POA: Diagnosis not present

## 2015-09-15 DIAGNOSIS — R339 Retention of urine, unspecified: Secondary | ICD-10-CM | POA: Diagnosis not present

## 2015-09-15 DIAGNOSIS — I5032 Chronic diastolic (congestive) heart failure: Secondary | ICD-10-CM | POA: Diagnosis not present

## 2015-09-17 ENCOUNTER — Encounter: Payer: Medicare Other | Attending: Surgery | Admitting: Surgery

## 2015-09-17 DIAGNOSIS — I509 Heart failure, unspecified: Secondary | ICD-10-CM | POA: Diagnosis not present

## 2015-09-17 DIAGNOSIS — Z87891 Personal history of nicotine dependence: Secondary | ICD-10-CM | POA: Diagnosis not present

## 2015-09-17 DIAGNOSIS — L89623 Pressure ulcer of left heel, stage 3: Secondary | ICD-10-CM | POA: Diagnosis not present

## 2015-09-17 DIAGNOSIS — Z992 Dependence on renal dialysis: Secondary | ICD-10-CM | POA: Diagnosis not present

## 2015-09-17 DIAGNOSIS — R339 Retention of urine, unspecified: Secondary | ICD-10-CM | POA: Insufficient documentation

## 2015-09-17 DIAGNOSIS — E1122 Type 2 diabetes mellitus with diabetic chronic kidney disease: Secondary | ICD-10-CM | POA: Insufficient documentation

## 2015-09-17 DIAGNOSIS — F419 Anxiety disorder, unspecified: Secondary | ICD-10-CM | POA: Insufficient documentation

## 2015-09-17 DIAGNOSIS — E11621 Type 2 diabetes mellitus with foot ulcer: Secondary | ICD-10-CM | POA: Insufficient documentation

## 2015-09-17 DIAGNOSIS — F0390 Unspecified dementia without behavioral disturbance: Secondary | ICD-10-CM | POA: Insufficient documentation

## 2015-09-17 DIAGNOSIS — Z880 Allergy status to penicillin: Secondary | ICD-10-CM | POA: Insufficient documentation

## 2015-09-17 DIAGNOSIS — Z7901 Long term (current) use of anticoagulants: Secondary | ICD-10-CM | POA: Insufficient documentation

## 2015-09-17 DIAGNOSIS — J449 Chronic obstructive pulmonary disease, unspecified: Secondary | ICD-10-CM | POA: Insufficient documentation

## 2015-09-17 DIAGNOSIS — I480 Paroxysmal atrial fibrillation: Secondary | ICD-10-CM | POA: Diagnosis not present

## 2015-09-17 DIAGNOSIS — N181 Chronic kidney disease, stage 1: Secondary | ICD-10-CM | POA: Diagnosis not present

## 2015-09-17 DIAGNOSIS — I7300 Raynaud's syndrome without gangrene: Secondary | ICD-10-CM | POA: Diagnosis not present

## 2015-09-17 DIAGNOSIS — L89622 Pressure ulcer of left heel, stage 2: Secondary | ICD-10-CM | POA: Diagnosis not present

## 2015-09-17 DIAGNOSIS — E46 Unspecified protein-calorie malnutrition: Secondary | ICD-10-CM | POA: Diagnosis not present

## 2015-09-17 DIAGNOSIS — M329 Systemic lupus erythematosus, unspecified: Secondary | ICD-10-CM | POA: Diagnosis not present

## 2015-09-17 DIAGNOSIS — Z6821 Body mass index (BMI) 21.0-21.9, adult: Secondary | ICD-10-CM | POA: Diagnosis not present

## 2015-09-18 NOTE — Progress Notes (Signed)
CARREY, VITTITOE ([id number]) Visit Report for 09/17/2015 Arrival Information Details Patient Name: [name], [name]. Date of Service: 09/17/2015 1:30 PM Medical Record Number: [id number] Patient Account Number: [id number] Date of Birth/Sex: [birth year] (79 y.o. Female) Treating RN: Baruch Gouty, RN, BSN, [name] Primary Care Physician: Halina Maidens Other Clinician: Referring Physician: Halina Maidens Treating Physician/Extender: Frann Rider in Treatment: 8 Visit Information History Since Last Visit Added or deleted any medications: No Patient Arrived: Wheel Chair Any new allergies or adverse reactions: No Arrival Time: 13:27 Had a fall or experienced change in No Accompanied By: caregiver activities of daily living that may affect Transfer Assistance: None risk of falls: Patient Identification Verified: Yes Signs or symptoms of abuse/neglect since last No Secondary Verification Process Yes visito Completed: Has Dressing in Place as Prescribed: Yes Patient Has Alerts: Yes Pain Present Now: No Patient Alerts: Patient on Blood Thinner Electronic Signature(s) Signed: 09/17/2015 4:31:50 PM By: Regan Lemming BSN, RN Entered By: Regan Lemming on 09/17/2015 13:27:46 [name] ([id number]) -------------------------------------------------------------------------------- Encounter Discharge Information Details Patient Name: [name] Date of Service: 09/17/2015 1:30 PM Medical Record Number: [id number] Patient Account Number: [id number] Date of Birth/Sex: Oct 06, 1935 (79 y.o. Female) Treating RN: Baruch Gouty, RN, BSN, [name] Primary Care Physician: Halina Maidens Other Clinician: Referring Physician: Halina Maidens Treating Physician/Extender: Frann Rider in Treatment: 8 Encounter Discharge Information Items Discharge Pain Level: 0 Discharge Condition: Stable Ambulatory Status: Wheelchair Discharge Destination: Home Transportation: Private Auto Accompanied By:  caregiver Schedule Follow-up Appointment: No Medication Reconciliation completed No and provided to Patient/Care Karletta Millay: Provided on Clinical Summary of Care: 09/17/2015 Form Type Recipient Paper Patient LK Electronic Signature(s) Signed: 09/17/2015 1:47:49 PM By: Ruthine Dose Entered By: Ruthine Dose on 09/17/2015 13:47:49 [name] ([id number]) -------------------------------------------------------------------------------- Lower Extremity Assessment Details Patient Name: [name] Date of Service: 09/17/2015 1:30 PM Medical Record Number: [id number] Patient Account Number: [id number] Date of Birth/Sex: March 11, 1936 (79 y.o. Female) Treating RN: Baruch Gouty, RN, BSN, [name] Primary Care Physician: Halina Maidens Other Clinician: Referring Physician: Halina Maidens Treating Physician/Extender: Frann Rider in Treatment: 8 Vascular Assessment Pulses: Posterior Tibial Dorsalis Pedis Palpable: [Left:Yes] Extremity colors, hair growth, and conditions: Extremity Color: [Left:Normal] Hair Growth on Extremity: [Left:No] Temperature of Extremity: [Left:Warm] Capillary Refill: [Left:< 3 seconds] Toe Nail Assessment Left: Right: Thick: No Discolored: No Deformed: No Improper Length and Hygiene: No Electronic Signature(s) Signed: 09/17/2015 4:31:50 PM By: Regan Lemming BSN, RN Entered By: Regan Lemming on 09/17/2015 13:30:15 [name] ([id number]) -------------------------------------------------------------------------------- Multi Wound Chart Details Patient Name: [name] Date of Service: 09/17/2015 1:30 PM Medical Record Number: [id number] Patient Account Number: [id number] Date of Birth/Sex: 1935/12/10 (79 y.o. Female) Treating RN: Baruch Gouty, RN, BSN, [name] Primary Care Physician: Halina Maidens Other Clinician: Referring Physician: Halina Maidens Treating Physician/Extender: Frann Rider in Treatment: 8 Vital Signs Height(in):  64 Pulse(bpm): 51 Weight(lbs): 124 Blood Pressure 164/53 (mmHg): Body Mass Index(BMI): 21 Temperature(F): 97.5 Respiratory Rate 18 (breaths/min): Photos: [1:No Photos] [N/A:N/A] Wound Location: [1:Left Calcaneus] [N/A:N/A] Wounding Event: [1:Pressure Injury] [N/A:N/A] Primary Etiology: [1:Pressure Ulcer] [N/A:N/A] Comorbid History: [1:Chronic Obstructive Pulmonary Disease (COPD), Angina, Congestive Heart Failure, Type II Diabetes, Lupus Erythematosus] [N/A:N/A] Date Acquired: [1:06/21/2015] [N/A:N/A] Weeks of Treatment: [1:8] [N/A:N/A] Wound Status: [1:Open] [N/A:N/A] Measurements L x W x D 0.2x0.2x0.2 [N/A:N/A] (cm) Area (cm) : [1:0.031] [N/A:N/A] Volume (cm) : [1:0.006] [N/A:N/A] % Reduction in Area: [1:97.40%] [N/A:N/A] % Reduction in Volume: 95.00% [N/A:N/A] Classification: [1:Category/Stage II] [N/A:N/A] HBO Classification: [1:Grade 1] [N/A:N/A] Exudate Amount: [1:Small] [N/A:N/A] Wound Margin: [1:Flat  and Intact] [N/A:N/A] Granulation Amount: [1:Large (67-100%)] [N/A:N/A] Granulation Quality: [1:Pink, Pale] [N/A:N/A] Necrotic Amount: [1:Small (1-33%)] [N/A:N/A] Exposed Structures: [1:Fascia: No Fat: No Tendon: No Muscle: No] [N/A:N/A] Joint: No Bone: No Limited to Skin Breakdown Epithelialization: Large (67-100%) N/A N/A Periwound Skin Texture: Callus: Yes N/A N/A Edema: No Excoriation: No Induration: No Crepitus: No Fluctuance: No Friable: No Rash: No Scarring: No Periwound Skin Moist: Yes N/A N/A Moisture: Dry/Scaly: Yes Maceration: No Periwound Skin Color: Atrophie Blanche: No N/A N/A Cyanosis: No Ecchymosis: No Erythema: No Hemosiderin Staining: No Mottled: No Pallor: No Rubor: No Tenderness on Yes N/A N/A Palpation: Wound Preparation: Ulcer Cleansing: N/A N/A Rinsed/Irrigated with Saline Topical Anesthetic Applied: Other: lidocaine 4% Treatment Notes Electronic Signature(s) Signed: 09/17/2015 4:31:50 PM By: Regan Lemming BSN,  RN Entered By: Regan Lemming on 09/17/2015 13:39:44 [name] ([id number]) -------------------------------------------------------------------------------- [place] Details Patient Name: [name] Date of Service: 09/17/2015 1:30 PM Medical Record Number: [id number] Patient Account Number: [id number] Date of Birth/Sex: [birth year] (79 y.o. Female) Treating RN: Baruch Gouty, RN, BSN, [name] Primary Care Physician: Halina Maidens Other Clinician: Referring Physician: Halina Maidens Treating Physician/Extender: Frann Rider in Treatment: 8 Active Inactive Abuse / Safety / Falls / Self Care Management Nursing Diagnoses: Impaired physical mobility Potential for falls Goals: Patient will remain injury free Date Initiated: 07/23/2015 Goal Status: Active Interventions: Assess fall risk on admission and as needed Notes: Orientation to the Wound Care Program Nursing Diagnoses: Knowledge deficit related to the wound healing center program Goals: Patient/caregiver will verbalize understanding of the [facility] Date Initiated: 07/23/2015 Goal Status: Active Interventions: Provide education on orientation to the wound center Notes: Pressure Nursing Diagnoses: Knowledge deficit related to causes and risk factors for pressure ulcer development Goals: Patient will remain free of pressure ulcers LARAVEN, [place] ([id number]) Date Initiated: 07/23/2015 Goal Status: Active Interventions: Assess potential for pressure ulcer upon admission and as needed Notes: Wound/Skin Impairment Nursing Diagnoses: Impaired tissue integrity Goals: Patient/caregiver will verbalize understanding of skin care regimen Date Initiated: 07/23/2015 Goal Status: Active Ulcer/skin breakdown will have a volume reduction of 30% by week 4 Date Initiated: 07/23/2015 Goal Status: Active Ulcer/skin breakdown will have a volume reduction of 50% by week 8 Date Initiated:  07/23/2015 Goal Status: Active Ulcer/skin breakdown will have a volume reduction of 80% by week 12 Date Initiated: 07/23/2015 Goal Status: Active Ulcer/skin breakdown will heal within 14 weeks Date Initiated: 07/23/2015 Goal Status: Active Interventions: Assess ulceration(s) every visit Notes: Electronic Signature(s) Signed: 09/17/2015 4:31:50 PM By: Regan Lemming BSN, RN Entered By: Regan Lemming on 09/17/2015 13:36:45 [name] ([id number]) -------------------------------------------------------------------------------- Pain Assessment Details Patient Name: [name] Date of Service: 09/17/2015 1:30 PM Medical Record Number: [id number] Patient Account Number: [id number] Date of Birth/Sex: March 13, 1936 (79 y.o. Female) Treating RN: Baruch Gouty, RN, BSN, [name] Primary Care Physician: Halina Maidens Other Clinician: Referring Physician: Halina Maidens Treating Physician/Extender: Frann Rider in Treatment: 8 Active Problems Location of Pain Severity and Description of Pain Patient Has Paino No Site Locations Pain Management and Medication Current Pain Management: Electronic Signature(s) Signed: 09/17/2015 4:31:50 PM By: Regan Lemming BSN, RN Entered By: Regan Lemming on 09/17/2015 13:27:54 [name] ([id number]) -------------------------------------------------------------------------------- Patient/Caregiver Education Details Patient Name: [name] Date of Service: 09/17/2015 1:30 PM Medical Record Number: [id number] Patient Account Number: [id number] Date of Birth/Gender: 1935/12/04 (80 y.o. Female) Treating RN: Baruch Gouty, RN, BSN, [name] Primary Care Physician: Halina Maidens Other Clinician: Referring Physician: Halina Maidens Treating Physician/Extender: [name]  Weeks in Treatment: 8 Education Assessment Education Provided To: Patient and Caregiver Education Topics Provided Welcome To [facility]: Methods: Explain/Verbal Responses:  State content correctly Wound Debridement: Methods: Explain/Verbal Responses: State content correctly Wound/Skin Impairment: Methods: Explain/Verbal Responses: State content correctly Electronic Signature(s) Signed: 09/17/2015 4:31:50 PM By: Regan Lemming BSN, RN Entered By: Regan Lemming on 09/17/2015 13:46:52 [name] ([id number]) -------------------------------------------------------------------------------- Wound Assessment Details Patient Name: [name] Date of Service: 09/17/2015 1:30 PM Medical Record Number: [id number] Patient Account Number: [id number] Date of Birth/Sex: April 13, 1936 (79 y.o. Female) Treating RN: Afful, RN, BSN, [place] Primary Care Physician: Halina Maidens Other Clinician: Referring Physician: Halina Maidens Treating Physician/Extender: Frann Rider in Treatment: 8 Wound Status Wound Number: 1 Primary Pressure Ulcer Etiology: Wound Location: Left Calcaneus Wound Open Wounding Event: Pressure Injury Status: Date Acquired: 06/21/2015 Comorbid Chronic Obstructive Pulmonary Disease Weeks Of Treatment: 8 History: (COPD), Angina, Congestive Heart Clustered Wound: No Failure, Type II Diabetes, Lupus Erythematosus Photos Photo Uploaded By: Regan Lemming on 09/17/2015 16:28:30 Wound Measurements Length: (cm) 0.2 Width: (cm) 0.2 Depth: (cm) 0.2 Area: (cm) 0.031 Volume: (cm) 0.006 % Reduction in Area: 97.4% % Reduction in Volume: 95% Epithelialization: Large (67-100%) Tunneling: No Undermining: No Wound Description Classification: Category/Stage II Foul Odor A Diabetic Severity (Wagner): Grade 1 Wound Margin: Flat and Intact Exudate Amount: Small fter Cleansing: No Wound Bed Granulation Amount: Large (67-100%) Exposed Structure Granulation Quality: Pink, Pale Fascia Exposed: No Necrotic Amount: Small (1-33%) Fat Layer Exposed: No Tendon Exposed: No SHANDEE, [place] ([id number]) Muscle Exposed: No Joint Exposed: No Bone  Exposed: No Limited to Skin Breakdown Periwound Skin Texture Texture Color No Abnormalities Noted: No No Abnormalities Noted: No Callus: Yes Atrophie Blanche: No Crepitus: No Cyanosis: No Excoriation: No Ecchymosis: No Fluctuance: No Erythema: No Friable: No Hemosiderin Staining: No Induration: No Mottled: No Localized Edema: No Pallor: No Rash: No Rubor: No Scarring: No Temperature / Pain Moisture Tenderness on Palpation: Yes No Abnormalities Noted: No Dry / Scaly: Yes Maceration: No Moist: Yes Wound Preparation Ulcer Cleansing: Rinsed/Irrigated with Saline Topical Anesthetic Applied: Other: lidocaine 4%, Treatment Notes Wound #1 (Left Calcaneus) 1. Cleansed with: Clean wound with Normal Saline 4. Dressing Applied: Prisma Ag 5. Secondary Dressing Applied Bordered Foam Dressing Notes offloading pad (felt) Electronic Signature(s) Signed: 09/17/2015 4:31:50 PM By: Regan Lemming BSN, RN Entered By: Regan Lemming on 09/17/2015 13:35:22 [name] ([id number]) -------------------------------------------------------------------------------- [place] Details Patient Name: [name] Date of Service: 09/17/2015 1:30 PM Medical Record Number: [id number] Patient Account Number: [id number] Date of Birth/Sex: 1936-02-03 (79 y.o. Female) Treating RN: Afful, RN, BSN, [place] Primary Care Physician: Halina Maidens Other Clinician: Referring Physician: Halina Maidens Treating Physician/Extender: Frann Rider in Treatment: 8 Vital Signs Time Taken: 13:30 Temperature (F): 97.5 Height (in): 64 Pulse (bpm): 51 Weight (lbs): 124 Respiratory Rate (breaths/min): 18 Body Mass Index (BMI): 21.3 Blood Pressure (mmHg): 164/53 Reference Range: 80 - 120 mg / dl Electronic Signature(s) Signed: 09/17/2015 4:31:50 PM By: Regan Lemming BSN, RN Entered By: Regan Lemming on 09/17/2015 13:33:54

## 2015-09-18 NOTE — Progress Notes (Signed)
CHENDA, ARTUS ([id number]) Visit Report for 09/17/2015 Chief Complaint Document Details Patient Name: [name], [name]. Date of Service: 09/17/2015 1:30 PM Medical Record Patient Account Number: [id number] Number: Afful, RN, BSN, Treating RN: March 28, 1936 (80 y.o. [name] Date of Birth/Sex: Female) Other Clinician: Primary Care Physician: Halina Maidens Treating [name] Referring Physician: Halina Maidens Physician/Extender: Suella Grove in Treatment: 8 Information Obtained from: Patient Chief Complaint Patients presents for treatment of an open diabetic ulcer which is also sure related and has been there for about a month Electronic Signature(s) Signed: 09/17/2015 1:45:38 PM By: [name], FACS Entered By: [name] on 09/17/2015 13:45:38 [name] ([id number]) -------------------------------------------------------------------------------- Debridement Details Patient Name: [name]. Date of Service: 09/17/2015 1:30 PM Medical Record Patient Account Number: [id number] Number: Afful, RN, BSN, Treating RN: 04/05/1936 (80 y.o. [name] Date of Birth/Sex: Female) Other Clinician: Primary Care Physician: Halina Maidens Treating [name] Referring Physician: Halina Maidens Physician/Extender: Suella Grove in Treatment: 8 Debridement Performed for Wound #1 Left Calcaneus Assessment: Performed By: Physician [name], MD Debridement: Debridement Pre-procedure Yes Verification/Time Out Taken: Start Time: 13:36 Pain Control: Lidocaine 4% Topical Solution Level: Skin/Subcutaneous Tissue Total Area Debrided (L x 0.2 (cm) x 0.2 (cm) = 0.04 (cm) W): Tissue and other Viable, Non-Viable, Callus, Fibrin/Slough, Subcutaneous material debrided: Instrument: Forceps, Scissors Bleeding: Minimum Hemostasis Achieved: Pressure End Time: 13:40 Procedural Pain: 3 Post Procedural Pain: 3 Response to Treatment: Procedure was tolerated well Post  Debridement Measurements of Total Wound Length: (cm) 0.6 Stage: Category/Stage II Width: (cm) 0.7 Depth: (cm) 0.2 Volume: (cm) 0.066 Post Procedure Diagnosis Same as Pre-procedure Electronic Signature(s) Signed: 09/17/2015 1:45:26 PM By: [name], FACS Signed: 09/17/2015 4:31:50 PM By: Regan Lemming BSN, RN Entered By: [name] on 09/17/2015 13:45:26 [name] ([id number]) -------------------------------------------------------------------------------- HPI Details Patient Name: [name] Date of Service: 09/17/2015 1:30 PM Medical Record Patient Account Number: [id number] Number: Afful, RN, BSN, Treating RN: [birth year] (80 y.o. [name] Date of Birth/Sex: Female) Other Clinician: Primary Care Physician: Halina Maidens Treating [name] Referring Physician: Halina Maidens Physician/Extender: Suella Grove in Treatment: 8 History of Present Illness Location: left posterior heel Quality: Patient reports experiencing a sharp pain to affected area(s). Severity: Patient states wound (s) are getting better. Duration: Patient has had the wound for < 4 weeks prior to presenting for treatment Timing: Pain in wound is Intermittent (comes and goes Context: The wound appeared gradually over time Modifying Factors: Consults to this date include:was seen by her PCP and put on some antibiotics a while ago Associated Signs and Symptoms: Patient reports having difficulty standing for long periods. HPI Description: 80 year old patient referred to [place] by her PCP Dr. Halina Maidens for a pressure ulcer to the left heel which she's had for about a month. She has a past medical history of COPD, diabetes mellitus type 2 with chronic kidney disease, multi-infarct dementia, anxiety, urinary retention, paroxysmal atrial fibrillation. She has also had moderate malnutrition, multi-infarct dementia, congestive heart failure, left displaced femoral neck fracture, systemic  lupus erythematosus. he is also status total abdominal hysterectomy with bilateral salpingo-oophorectomy, EGDs, hip arthroplasty on the left, cataract surgery. She has been a from a smoker and quit in May 2013 and she smoked for about 40 years. recently admitted to [facility] between January 19 and 07/03/2015. x-ray of the left foot done on 07/02/2015 showed no acute abnormality. the dry ulcer on the left heel was treated with Keflex and in the past but there  was no specific treatment given and there was no drainage. 08/02/2015 -- patient says this heel is extremely tender and last week after her debridement was done she had a lot of tenderness and compares it to worse than childbirth and kidney stones. 08/23/2015 -- the patient has not been seen here for about 3 weeks but is in a much better state of mind today and seems to be having less pain. He has a caregiver today who says the patient was down with significant upper respiratory infection. Electronic Signature(s) Signed: 09/17/2015 1:45:43 PM By: [name], FACS Entered By: [name] on 09/17/2015 13:45:43 [name] ([id number]) -------------------------------------------------------------------------------- Physical Exam Details Patient Name: [name] Date of Service: 09/17/2015 1:30 PM Medical Record Patient Account Number: [id number] Number: Afful, RN, BSN, Treating RN: Mar 13, 1936 (80 y.o. [name] Date of Birth/Sex: Female) Other Clinician: Primary Care Physician: Halina Maidens Treating [name] Referring Physician: Halina Maidens Physician/Extender: Suella Grove in Treatment: 8 Constitutional . Pulse regular. Respirations normal and unlabored. Afebrile. . Eyes Nonicteric. Reactive to light. Ears, Nose, Mouth, and Throat Lips, teeth, and gums WNL.Marland Kitchen Moist mucosa without lesions. Neck supple and nontender. No palpable supraclavicular or cervical adenopathy. Normal sized  without goiter. Respiratory WNL. No retractions.. Breath sounds WNL, No rubs, rales, rhonchi, or wheeze.. Cardiovascular Heart rhythm and rate regular, no murmur or gallop.. Pedal Pulses WNL. No clubbing, cyanosis or edema. Lymphatic No adneopathy. No adenopathy. No adenopathy. Musculoskeletal Adexa without tenderness or enlargement.. Digits and nails w/o clubbing, cyanosis, infection, petechiae, ischemia, or inflammatory conditions.. Integumentary (Hair, Skin) No suspicious lesions. No crepitus or fluctuance. No peri-wound warmth or erythema. No masses.Marland Kitchen Psychiatric Judgement and insight Intact.. No evidence of depression, anxiety, or agitation.. Notes the patient continued to have drainage and I sharply removed the surrounding skin and subcutaneous tissue and there is a significant ulceration which is still there with clean granulation tissue at the depth. Bleeding controlled with pressure. Electronic Signature(s) Signed: 09/17/2015 1:46:20 PM By: [name], FACS Entered By: [name] on 09/17/2015 13:46:20 [name] ([id number]) -------------------------------------------------------------------------------- Physician Orders Details Patient Name: [name] Date of Service: 09/17/2015 1:30 PM Medical Record Patient Account Number: [id number] Number: Afful, RN, BSN, Treating RN: 11/24/1935 (80 y.o. [name] Date of Birth/Sex: Female) Other Clinician: Primary Care Physician: Halina Maidens Treating [name] Referring Physician: Halina Maidens Physician/Extender: Suella Grove in Treatment: 8 Verbal / Phone Orders: Yes Clinician: Afful, RN, BSN, Rita Read Back and Verified: Yes Diagnosis Coding Wound Cleansing Wound #1 Left Calcaneus o Cleanse wound with mild soap and water Primary Wound Dressing Wound #1 Left Calcaneus o Prisma Ag Secondary Dressing Wound #1 Left Calcaneus o Boardered Foam Dressing Follow-up Appointments Wound #1  Left Calcaneus o Return Appointment in 1 week. Off-Loading Wound #1 Left Calcaneus o Other: - keep pressure off heel Home Health Wound #1 Left Calcaneus o Continue Home Health Visits - [place] Nurse may visit PRN to address patientos wound care needs. o FACE TO FACE ENCOUNTER: MEDICARE and MEDICAID PATIENTS: I certify that this patient is under my care and that I had a face-to-face encounter that meets the physician face-to-face encounter requirements with this patient on this date. The encounter with the patient was in whole or in part for the following MEDICAL CONDITION: (primary reason for [place]) MEDICAL NECESSITY: I certify, that based on my findings, NURSING services are a medically necessary home health service. HOME BOUND STATUS: I certify that my clinical findings support that  this patient is homebound (i.e., Due to illness or injury, pt requires aid of supportive devices such as crutches, cane, wheelchairs, walkers, the use of special transportation or the assistance of another person to leave their place of residence. There is a normal inability to leave the home and doing so requires considerable and taxing effort. DEVITA, HEWATT ([id number]) absences are for medical reasons / religious services and are infrequent or of short duration when for other reasons). o If current dressing causes regression in wound condition, may D/C ordered dressing product/s and apply Normal Saline Moist Dressing daily until next [place] / Other MD appointment. [place] of regression in wound condition at [phone]. o Please direct any NON-WOUND related issues/requests for orders to patient's Primary Care Physician Electronic Signature(s) Signed: 09/17/2015 4:30:51 PM By: [name], FACS Signed: 09/17/2015 4:31:50 PM By: Regan Lemming BSN, RN Entered By: Regan Lemming on 09/17/2015 13:42:39 [name]  ([id number]) -------------------------------------------------------------------------------- Problem List Details Patient Name: [name] Date of Service: 09/17/2015 1:30 PM Medical Record Patient Account Number: [id number] Number: Afful, RN, BSN, Treating RN: 24-Aug-1935 (80 y.o. [name] Date of Birth/Sex: Female) Other Clinician: Primary Care Physician: Halina Maidens Treating [name] Referring Physician: Halina Maidens Physician/Extender: Suella Grove in Treatment: 8 Active Problems ICD-10 Encounter Code Description Active Date Diagnosis E11.621 Type 2 diabetes mellitus with foot ulcer 07/23/2015 Yes L89.623 Pressure ulcer of left heel, stage 3 07/23/2015 Yes Z79.01 Long term (current) use of anticoagulants 07/23/2015 Yes Inactive Problems Resolved Problems Electronic Signature(s) Signed: 09/17/2015 1:45:13 PM By: [name], FACS Entered By: [name] on 09/17/2015 13:45:13 [name] ([id number]) -------------------------------------------------------------------------------- Progress Note Details Patient Name: [name] Date of Service: 09/17/2015 1:30 PM Medical Record Patient Account Number: [id number] Number: Afful, RN, BSN, Treating RN: December 04, 1935 (80 y.o. [name] Date of Birth/Sex: Female) Other Clinician: Primary Care Physician: Halina Maidens Treating [name] Referring Physician: Halina Maidens Physician/Extender: Suella Grove in Treatment: 8 Subjective Chief Complaint Information obtained from Patient Patients presents for treatment of an open diabetic ulcer which is also sure related and has been there for about a month History of Present Illness (HPI) The following HPI elements were documented for the patient's wound: Location: left posterior heel Quality: Patient reports experiencing a sharp pain to affected area(s). Severity: Patient states wound (s) are getting better. Duration: Patient has had the wound  for < 4 weeks prior to presenting for treatment Timing: Pain in wound is Intermittent (comes and goes Context: The wound appeared gradually over time Modifying Factors: Consults to this date include:was seen by her PCP and put on some antibiotics a while ago Associated Signs and Symptoms: Patient reports having difficulty standing for long periods. 80 year old patient referred to [place] by her PCP Dr. Halina Maidens for a pressure ulcer to the left heel which she's had for about a month. She has a past medical history of COPD, diabetes mellitus type 2 with chronic kidney disease, multi-infarct dementia, anxiety, urinary retention, paroxysmal atrial fibrillation. She has also had moderate malnutrition, multi-infarct dementia, congestive heart failure, left displaced femoral neck fracture, systemic lupus erythematosus. he is also status total abdominal hysterectomy with bilateral salpingo-oophorectomy, EGDs, hip arthroplasty on the left, cataract surgery. She has been a from a smoker and quit in May 2013 and she smoked for about 40 years. recently admitted to [facility] between January 19 and 07/03/2015. x-ray of the left foot done on 07/02/2015 showed no acute abnormality.  the dry ulcer on the left heel was treated with Keflex and in the past but there was no specific treatment given and there was no drainage. 08/02/2015 -- patient says this heel is extremely tender and last week after her debridement was done she had a lot of tenderness and compares it to worse than childbirth and kidney stones. 08/23/2015 -- the patient has not been seen here for about 3 weeks but is in a much better state of mind today and seems to be having less pain. He has a caregiver today who says the patient was down with significant upper respiratory infection. [name], [name] ([id number]) Objective Constitutional Pulse regular. Respirations normal and unlabored. Afebrile. Vitals Time Taken:  1:30 PM, Height: 64 in, Weight: 124 lbs, BMI: 21.3, Temperature: 97.5 F, Pulse: 51 bpm, Respiratory Rate: 18 breaths/min, Blood Pressure: 164/53 mmHg. Eyes Nonicteric. Reactive to light. Ears, Nose, Mouth, and Throat Lips, teeth, and gums WNL.Marland Kitchen Moist mucosa without lesions. Neck supple and nontender. No palpable supraclavicular or cervical adenopathy. Normal sized without goiter. Respiratory WNL. No retractions.. Breath sounds WNL, No rubs, rales, rhonchi, or wheeze.. Cardiovascular Heart rhythm and rate regular, no murmur or gallop.. Pedal Pulses WNL. No clubbing, cyanosis or edema. Lymphatic No adneopathy. No adenopathy. No adenopathy. Musculoskeletal Adexa without tenderness or enlargement.. Digits and nails w/o clubbing, cyanosis, infection, petechiae, ischemia, or inflammatory conditions.Marland Kitchen Psychiatric Judgement and insight Intact.. No evidence of depression, anxiety, or agitation.. General Notes: the patient continued to have drainage and I sharply removed the surrounding skin and subcutaneous tissue and there is a significant ulceration which is still there with clean granulation tissue at the depth. Bleeding controlled with pressure. Integumentary (Hair, Skin) No suspicious lesions. No crepitus or fluctuance. No peri-wound warmth or erythema. No masses.. Wound #1 status is Open. Original cause of wound was Pressure Injury. The wound is located on the Left Calcaneus. The wound measures 0.2cm length x 0.2cm width x 0.2cm depth; 0.031cm^2 area and [name], [name]. ([id number]) 0.006cm^3 volume. The wound is limited to skin breakdown. There is no tunneling or undermining noted. There is a small amount of drainage noted. The wound margin is flat and intact. There is large (67-100%) pink, pale granulation within the wound bed. There is a small (1-33%) amount of necrotic tissue within the wound bed. The periwound skin appearance exhibited: Callus, Dry/Scaly, Moist. The periwound  skin appearance did not exhibit: Crepitus, Excoriation, Fluctuance, Friable, Induration, Localized Edema, Rash, Scarring, Maceration, Atrophie Blanche, Cyanosis, Ecchymosis, Hemosiderin Staining, Mottled, Pallor, Rubor, Erythema. The periwound has tenderness on palpation. Assessment Active Problems ICD-10 E11.621 - Type 2 diabetes mellitus with foot ulcer L89.623 - Pressure ulcer of left heel, stage 3 Z79.01 - Long term (current) use of anticoagulants Procedures Wound #1 Wound #1 is a Pressure Ulcer located on the Left Calcaneus . There was a Skin/Subcutaneous Tissue Debridement [id number]) debridement with total area of 0.04 sq cm performed by [name], MD. with the following instrument(s): Forceps and Scissors to remove Viable and Non-Viable tissue/material including Fibrin/Slough, Callus, and Subcutaneous after achieving pain control using Lidocaine 4% Topical Solution. A time out was conducted prior to the start of the procedure. A Minimum amount of bleeding was controlled with Pressure. The procedure was tolerated well with a pain level of 3 throughout and a pain level of 3 following the procedure. Post Debridement Measurements: 0.6cm length x 0.7cm width x 0.2cm depth; 0.066cm^3 volume. Post debridement Stage noted as Category/Stage II. Post procedure Diagnosis Wound #1: Same as  Pre-Procedure Plan Wound Cleansing: Wound #1 Left Calcaneus: Cleanse wound with mild soap and water Primary Wound Dressing: [name], [name] ([id number]) Wound #1 Left Calcaneus: Prisma Ag Secondary Dressing: Wound #1 Left Calcaneus: Boardered Foam Dressing Follow-up Appointments: Wound #1 Left Calcaneus: Return Appointment in 1 week. Off-Loading: Wound #1 Left Calcaneus: Other: - keep pressure off heel Home Health: Wound #1 Left Calcaneus: Continue Home Health Visits - [place] Nurse may visit PRN to address patient s wound care needs. FACE TO FACE ENCOUNTER: MEDICARE and  MEDICAID PATIENTS: I certify that this patient is under my care and that I had a face-to-face encounter that meets the physician face-to-face encounter requirements with this patient on this date. The encounter with the patient was in whole or in part for the following MEDICAL CONDITION: (primary reason for [place]) MEDICAL NECESSITY: I certify, that based on my findings, NURSING services are a medically necessary home health service. HOME BOUND STATUS: I certify that my clinical findings support that this patient is homebound (i.e., Due to illness or injury, pt requires aid of supportive devices such as crutches, cane, wheelchairs, walkers, the use of special transportation or the assistance of another person to leave their place of residence. There is a normal inability to leave the home and doing so requires considerable and taxing effort. Other absences are for medical reasons / religious services and are infrequent or of short duration when for other reasons). If current dressing causes regression in wound condition, may D/C ordered dressing product/s and apply Normal Saline Moist Dressing daily until next [place] / Other MD appointment. [place] of regression in wound condition at [phone]. Please direct any NON-WOUND related issues/requests for orders to patient's Primary Care Physician We will continue with Prisma AG and a bordered foam and we have again discussed offloading and appropriate nutrition. The wound continues to be clean with healthy granulation tissue and we will need to see her for a few more visits. Electronic Signature(s) Signed: 09/17/2015 1:47:10 PM By: [name], FACS Entered By: [name] on 09/17/2015 13:47:09 [name] ([id number]) -------------------------------------------------------------------------------- SuperBill Details Patient Name: [name] Date of Service: 09/17/2015 Medical Record  Patient Account Number: [id number] Number: Afful, RN, BSN, Treating RN: 09-07-1935 (80 y.o. [name] Date of Birth/Sex: Female) Other Clinician: Primary Care Physician: Halina Maidens Treating [name] Referring Physician: Halina Maidens Physician/Extender: Suella Grove in Treatment: 8 Diagnosis Coding ICD-10 Codes Code Description E11.621 Type 2 diabetes mellitus with foot ulcer L89.623 Pressure ulcer of left heel, stage 3 Z79.01 Long term (current) use of anticoagulants Facility Procedures CPT4 Code: [id number] Description: [id number] - DEB SUBQ TISSUE 20 SQ CM/< ICD-10 Description Diagnosis E11.621 Type 2 diabetes mellitus with foot ulcer L89.623 Pressure ulcer of left heel, stage 3 Z79.01 Long term (current) use of anticoagulants Modifier: Quantity: 1 Physician Procedures CPT4 Code: [id number] Description: [id number] - WC PHYS SUBQ TISS 20 SQ CM ICD-10 Description Diagnosis E11.621 Type 2 diabetes mellitus with foot ulcer L89.623 Pressure ulcer of left heel, stage 3 Z79.01 Long term (current) use of anticoagulants Modifier: Quantity: 1 Electronic Signature(s) Signed: 09/17/2015 1:47:34 PM By: [name], FACS Entered By: [name] on 09/17/2015 13:47:33

## 2015-09-21 DIAGNOSIS — R339 Retention of urine, unspecified: Secondary | ICD-10-CM | POA: Diagnosis not present

## 2015-09-21 DIAGNOSIS — I5032 Chronic diastolic (congestive) heart failure: Secondary | ICD-10-CM | POA: Diagnosis not present

## 2015-09-21 DIAGNOSIS — F0390 Unspecified dementia without behavioral disturbance: Secondary | ICD-10-CM | POA: Diagnosis not present

## 2015-09-21 DIAGNOSIS — J449 Chronic obstructive pulmonary disease, unspecified: Secondary | ICD-10-CM | POA: Diagnosis not present

## 2015-09-21 DIAGNOSIS — L89622 Pressure ulcer of left heel, stage 2: Secondary | ICD-10-CM | POA: Diagnosis not present

## 2015-09-21 DIAGNOSIS — E1140 Type 2 diabetes mellitus with diabetic neuropathy, unspecified: Secondary | ICD-10-CM | POA: Diagnosis not present

## 2015-09-22 ENCOUNTER — Other Ambulatory Visit: Payer: Self-pay | Admitting: Specialist

## 2015-09-22 DIAGNOSIS — R0902 Hypoxemia: Secondary | ICD-10-CM | POA: Diagnosis not present

## 2015-09-22 DIAGNOSIS — R918 Other nonspecific abnormal finding of lung field: Secondary | ICD-10-CM | POA: Diagnosis not present

## 2015-09-22 DIAGNOSIS — J449 Chronic obstructive pulmonary disease, unspecified: Secondary | ICD-10-CM | POA: Diagnosis not present

## 2015-09-22 DIAGNOSIS — R0609 Other forms of dyspnea: Secondary | ICD-10-CM | POA: Diagnosis not present

## 2015-09-24 ENCOUNTER — Encounter (HOSPITAL_BASED_OUTPATIENT_CLINIC_OR_DEPARTMENT_OTHER): Payer: Medicare Other | Admitting: General Surgery

## 2015-09-24 DIAGNOSIS — L89623 Pressure ulcer of left heel, stage 3: Secondary | ICD-10-CM

## 2015-09-24 DIAGNOSIS — J449 Chronic obstructive pulmonary disease, unspecified: Secondary | ICD-10-CM | POA: Diagnosis not present

## 2015-09-24 DIAGNOSIS — E1122 Type 2 diabetes mellitus with diabetic chronic kidney disease: Secondary | ICD-10-CM | POA: Diagnosis not present

## 2015-09-24 DIAGNOSIS — E11621 Type 2 diabetes mellitus with foot ulcer: Secondary | ICD-10-CM | POA: Diagnosis not present

## 2015-09-24 DIAGNOSIS — Z7901 Long term (current) use of anticoagulants: Secondary | ICD-10-CM | POA: Diagnosis not present

## 2015-09-24 DIAGNOSIS — F0390 Unspecified dementia without behavioral disturbance: Secondary | ICD-10-CM | POA: Diagnosis not present

## 2015-09-24 DIAGNOSIS — I639 Cerebral infarction, unspecified: Secondary | ICD-10-CM | POA: Diagnosis not present

## 2015-09-24 NOTE — Progress Notes (Signed)
[name], [name] ([id number]) Visit Report for 09/24/2015 Arrival Information Details Patient Name: [name], [name]. Date of Service: 09/24/2015 10:45 AM Medical Record Number: [id number] Patient Account Number: [id number] Date of Birth/Sex: October 08, 1935 (80 y.o. Female) Treating RN: Baruch Gouty, RN, BSN, [name] Primary Care Physician: Halina Maidens Other Clinician: Referring Physician: Halina Maidens Treating Physician/Extender: Benjaman Pott in Treatment: 9 Visit Information History Since Last Visit Added or deleted any medications: No Patient Arrived: Wheel Chair Any new allergies or adverse reactions: No Arrival Time: 10:29 Had a fall or experienced change in No Accompanied By: dtr activities of daily living that may affect Transfer Assistance: None risk of falls: Patient Identification Verified: Yes Signs or symptoms of abuse/neglect since last No Secondary Verification Process Yes visito Completed: Hospitalized since last visit: No Patient Has Alerts: Yes Has Dressing in Place as Prescribed: Yes Patient Alerts: Patient on Blood Pain Present Now: No Thinner Electronic Signature(s) Signed: 09/24/2015 1:41:16 PM By: Regan Lemming BSN, RN Entered By: Regan Lemming on 09/24/2015 10:29:21 [name] ([id number]) -------------------------------------------------------------------------------- Clinic Level of Care Assessment Details Patient Name: [name] Date of Service: 09/24/2015 10:45 AM Medical Record Number: [id number] Patient Account Number: [id number] Date of Birth/Sex: 10/20/1935 (80 y.o. Female) Treating RN: [name] Primary Care Physician: Halina Maidens Other Clinician: Referring Physician: Halina Maidens Treating Physician/Extender: Benjaman Pott in Treatment: 9 Clinic Level of Care Assessment Items TOOL 4 Quantity Score []  - Use when only an EandM is performed on FOLLOW-UP visit 0 ASSESSMENTS - Nursing Assessment / Reassessment X -  Reassessment of Co-morbidities (includes updates in patient status) 1 10 X - Reassessment of Adherence to Treatment Plan 1 5 ASSESSMENTS - Wound and Skin Assessment / Reassessment X - Simple Wound Assessment / Reassessment - one wound 1 5 []  - Complex Wound Assessment / Reassessment - multiple wounds 0 []  - Dermatologic / Skin Assessment (not related to wound area) 0 ASSESSMENTS - Focused Assessment []  - Circumferential Edema Measurements - multi extremities 0 []  - Nutritional Assessment / Counseling / Intervention 0 X - Lower Extremity Assessment (monofilament, tuning fork, pulses) 1 5 []  - Peripheral Arterial Disease Assessment (using hand held doppler) 0 ASSESSMENTS - Ostomy and/or Continence Assessment and Care []  - Incontinence Assessment and Management 0 []  - Ostomy Care Assessment and Management (repouching, etc.) 0 PROCESS - Coordination of Care X - Simple Patient / Family Education for ongoing care 1 15 []  - Complex (extensive) Patient / Family Education for ongoing care 0 []  - Staff obtains Programmer, systems, Records, Test Results / Process Orders 0 []  - Staff telephones HHA, Nursing Homes / Clarify orders / etc 0 []  - Routine Transfer to another Facility (non-emergent condition) 0 [name], [name] ([id number]) []  - Routine Hospital Admission (non-emergent condition) 0 []  - New Admissions / Biomedical engineer / Ordering NPWT, Apligraf, etc. 0 []  - Emergency Hospital Admission (emergent condition) 0 X - Simple Discharge Coordination 1 10 []  - Complex (extensive) Discharge Coordination 0 PROCESS - Special Needs []  - Pediatric / Minor Patient Management 0 []  - Isolation Patient Management 0 []  - Hearing / Language / Visual special needs 0 []  - Assessment of Community assistance (transportation, D/C planning, etc.) 0 []  - Additional assistance / Altered mentation 0 []  - Support Surface(s) Assessment (bed, cushion, seat, etc.) 0 INTERVENTIONS - Wound Cleansing / Measurement X -  Simple Wound Cleansing - one wound 1 5 []  - Complex Wound Cleansing - multiple wounds 0 X - Wound Imaging (photographs - any number  of wounds) 1 5 []  - Wound Tracing (instead of photographs) 0 X - Simple Wound Measurement - one wound 1 5 []  - Complex Wound Measurement - multiple wounds 0 INTERVENTIONS - Wound Dressings X - Small Wound Dressing one or multiple wounds 1 10 []  - Medium Wound Dressing one or multiple wounds 0 []  - Large Wound Dressing one or multiple wounds 0 []  - Application of Medications - topical 0 []  - Application of Medications - injection 0 INTERVENTIONS - Miscellaneous []  - External ear exam 0 [name], [name] ([id number]) []  - Specimen Collection (cultures, biopsies, blood, body fluids, etc.) 0 []  - Specimen(s) / Culture(s) sent or taken to Lab for analysis 0 []  - Patient Transfer (multiple staff / Harrel Lemon Lift / Similar devices) 0 []  - Simple Staple / Suture removal (25 or less) 0 []  - Complex Staple / Suture removal (26 or more) 0 []  - Hypo / Hyperglycemic Management (close monitor of Blood Glucose) 0 []  - Ankle / Brachial Index (ABI) - do not check if billed separately 0 X - Vital Signs 1 5 Has the patient been seen at the hospital within the last three years: Yes Total Score: 80 Level Of Care: New/Established - Level 3 Electronic Signature(s) Signed: 09/24/2015 11:23:19 AM By: [name] Entered By: [name] on 09/24/2015 11:23:19 [name] ([id number]) -------------------------------------------------------------------------------- Encounter Discharge Information Details Patient Name: [name] Date of Service: 09/24/2015 10:45 AM Medical Record Number: [id number] Patient Account Number: [id number] Date of Birth/Sex: 12/12/1935 (80 y.o. Female) Treating RN: Baruch Gouty, RN, BSN, [name] Primary Care Physician: Halina Maidens Other Clinician: Referring Physician: Halina Maidens Treating Physician/Extender: Benjaman Pott in  Treatment: 9 Encounter Discharge Information Items Schedule Follow-up Appointment: No Medication Reconciliation completed No and provided to Patient/Care Alyanah Elliott: Provided on Clinical Summary of Care: 09/24/2015 Form Type Recipient Paper Patient LK Electronic Signature(s) Signed: 09/24/2015 11:37:28 AM By: Judene Companion MD Previous Signature: 09/24/2015 10:51:26 AM Version By: Ruthine Dose Entered By: Judene Companion on 09/24/2015 11:37:28 [name] ([id number]) -------------------------------------------------------------------------------- Lower Extremity Assessment Details Patient Name: [name]. Date of Service: 09/24/2015 10:45 AM Medical Record Number: [id number] Patient Account Number: [id number] Date of Birth/Sex: 04/14/1936 (80 y.o. Female) Treating RN: Baruch Gouty, RN, BSN, [name] Primary Care Physician: Halina Maidens Other Clinician: Referring Physician: Halina Maidens Treating Physician/Extender: Benjaman Pott in Treatment: 9 Vascular Assessment Claudication: Claudication Assessment [Left:None] Pulses: Posterior Tibial Dorsalis Pedis Palpable: [Left:Yes] Extremity colors, hair growth, and conditions: Extremity Color: [Left:Normal] Hair Growth on Extremity: [Left:No] Temperature of Extremity: [Left:Warm] Capillary Refill: [Left:< 3 seconds] Toe Nail Assessment Left: Right: Thick: No Discolored: No Deformed: No Improper Length and Hygiene: No Electronic Signature(s) Signed: 09/24/2015 1:41:16 PM By: Regan Lemming BSN, RN Entered By: Regan Lemming on 09/24/2015 10:31:17 [name] ([id number]) -------------------------------------------------------------------------------- Multi Wound Chart Details Patient Name: [name] Date of Service: 09/24/2015 10:45 AM Medical Record Number: [id number] Patient Account Number: [id number] Date of Birth/Sex: [birth year] (80 y.o. Female) Treating RN: [name] Primary Care Physician: Halina Maidens Other Clinician: Referring Physician: Halina Maidens Treating Physician/Extender: Benjaman Pott in Treatment: 9 Vital Signs Height(in): 64 Pulse(bpm): 63 Weight(lbs): 124 Blood Pressure 129/47 (mmHg): Body Mass Index(BMI): 21 Temperature(F): 97.9 Respiratory Rate 17 (breaths/min): Photos: [1:No Photos] [N/A:N/A] Wound Location: [1:Left Calcaneus] [N/A:N/A] Wounding Event: [1:Pressure Injury] [N/A:N/A] Primary Etiology: [1:Pressure Ulcer] [N/A:N/A] Comorbid History: [1:Chronic Obstructive Pulmonary Disease (COPD), Angina, Congestive Heart Failure, Type II Diabetes, Lupus Erythematosus] [N/A:N/A] Date Acquired: [1:06/21/2015] [N/A:N/A] Weeks of Treatment: [1:9] [N/A:N/A]  Wound Status: [1:Open] [N/A:N/A] Measurements L x W x D 0.7x0.8x0.2 [N/A:N/A] (cm) Area (cm) : [1:0.44] [N/A:N/A] Volume (cm) : [1:0.088] [N/A:N/A] % Reduction in Area: [1:63.60%] [N/A:N/A] % Reduction in Volume: 27.30% [N/A:N/A] Classification: [1:Category/Stage II] [N/A:N/A] HBO Classification: [1:Grade 1] [N/A:N/A] Exudate Amount: [1:Small] [N/A:N/A] Wound Margin: [1:Flat and Intact] [N/A:N/A] Granulation Amount: [1:Large (67-100%)] [N/A:N/A] Granulation Quality: [1:Pink, Pale] [N/A:N/A] Necrotic Amount: [1:Small (1-33%)] [N/A:N/A] Exposed Structures: [1:Fascia: No Fat: No Tendon: No Muscle: No] [N/A:N/A] Joint: No Bone: No Limited to Skin Breakdown Epithelialization: None N/A N/A Periwound Skin Texture: Callus: Yes N/A N/A Edema: No Excoriation: No Induration: No Crepitus: No Fluctuance: No Friable: No Rash: No Scarring: No Periwound Skin Maceration: Yes N/A N/A Moisture: Moist: Yes Dry/Scaly: No Periwound Skin Color: Atrophie Blanche: No N/A N/A Cyanosis: No Ecchymosis: No Erythema: No Hemosiderin Staining: No Mottled: No Pallor: No Rubor: No Tenderness on Yes N/A N/A Palpation: Wound Preparation: Ulcer Cleansing: N/A N/A Rinsed/Irrigated with Saline Topical  Anesthetic Applied: Other: lidocaine 4% Treatment Notes Electronic Signature(s) Signed: 09/24/2015 1:41:43 PM By: [name] Entered By: [name] on 09/24/2015 10:45:31 [name] ([name]) -------------------------------------------------------------------------------- [place] Details Patient Name: [name] Date of Service: 09/24/2015 10:45 AM Medical Record Number: [name] Patient Account Number: [id number] Date of Birth/Sex: 1935-11-20 (80 y.o. Female) Treating RN: [name] Primary Care Physician: Halina Maidens Other Clinician: Referring Physician: Halina Maidens Treating Physician/Extender: Benjaman Pott in Treatment: 9 Active Inactive Abuse / Safety / Falls / Self Care Management Nursing Diagnoses: Impaired physical mobility Potential for falls Goals: Patient will remain injury free Date Initiated: 07/23/2015 Goal Status: Active Interventions: Assess fall risk on admission and as needed Notes: Orientation to the Wound Care Program Nursing Diagnoses: Knowledge deficit related to the wound healing center program Goals: Patient/caregiver will verbalize understanding of the [place] Program Date Initiated: 07/23/2015 Goal Status: Active Interventions: Provide education on orientation to the wound center Notes: Pressure Nursing Diagnoses: Knowledge deficit related to causes and risk factors for pressure ulcer development Goals: Patient will remain free of pressure ulcers [name], [name] ([name]) Date Initiated: 07/23/2015 Goal Status: Active Interventions: Assess potential for pressure ulcer upon admission and as needed Notes: Wound/Skin Impairment Nursing Diagnoses: Impaired tissue integrity Goals: Patient/caregiver will verbalize understanding of skin care regimen Date Initiated: 07/23/2015 Goal Status: Active Ulcer/skin breakdown will have a volume reduction of 30% by week  4 Date Initiated: 07/23/2015 Goal Status: Active Ulcer/skin breakdown will have a volume reduction of 50% by week 8 Date Initiated: 07/23/2015 Goal Status: Active Ulcer/skin breakdown will have a volume reduction of 80% by week 12 Date Initiated: 07/23/2015 Goal Status: Active Ulcer/skin breakdown will heal within 14 weeks Date Initiated: 07/23/2015 Goal Status: Active Interventions: Assess ulceration(s) every visit Notes: Electronic Signature(s) Signed: 09/24/2015 1:41:43 PM By: [name] Entered By: [name] on 09/24/2015 10:44:24 [name] ([name]) -------------------------------------------------------------------------------- Pain Assessment Details Patient Name: [name] Date of Service: 09/24/2015 10:45 AM Medical Record Number: [name] Patient Account Number: [id number] Date of Birth/Sex: [birth year] (80 y.o. Female) Treating RN: Baruch Gouty, RN, BSN, [name] Primary Care Physician: Halina Maidens Other Clinician: Referring Physician: Halina Maidens Treating Physician/Extender: Benjaman Pott in Treatment: 9 Active Problems Location of Pain Severity and Description of Pain Patient Has Paino No Site Locations Pain Management and Medication Current Pain Management: Electronic Signature(s) Signed: 09/24/2015 1:41:16 PM By: Regan Lemming BSN, RN Entered By: Regan Lemming on 09/24/2015 10:31:01 [name] ([name]) -------------------------------------------------------------------------------- Patient/Caregiver Education Details Patient Name: Madison Obey  Teresa Coombs. Date of Service: 09/24/2015 10:45 AM Medical Record Number: [id number] Patient Account Number: [id number] Date of Birth/Gender: Mar 30, 1936 (80 y.o. Female) Treating RN: Afful, RN, BSN, [name] Primary Care Physician: Halina Maidens Other Clinician: Referring Physician: Halina Maidens Treating Physician/Extender: Benjaman Pott in Treatment: 9 Education Assessment Education  Provided To: Patient Education Topics Provided Electronic Signature(s) Signed: 09/24/2015 11:37:40 AM By: Judene Companion MD Entered By: Judene Companion on 09/24/2015 11:37:40 [name] ([id number]) -------------------------------------------------------------------------------- Wound Assessment Details Patient Name: [name] Date of Service: 09/24/2015 10:45 AM Medical Record Number: [id number] Patient Account Number: [id number] Date of Birth/Sex: October 05, 1935 (80 y.o. Female) Treating RN: Afful, RN, BSN, [place] Primary Care Physician: Halina Maidens Other Clinician: Referring Physician: Halina Maidens Treating Physician/Extender: Judene Companion Weeks in Treatment: 9 Wound Status Wound Number: 1 Primary Pressure Ulcer Etiology: Wound Location: Left Calcaneus Wound Open Wounding Event: Pressure Injury Status: Date Acquired: 06/21/2015 Comorbid Chronic Obstructive Pulmonary Disease Weeks Of Treatment: 9 History: (COPD), Angina, Congestive Heart Clustered Wound: No Failure, Type II Diabetes, Lupus Erythematosus Photos Photo Uploaded By: Regan Lemming on 09/24/2015 13:34:26 Wound Measurements Length: (cm) 0.7 Width: (cm) 0.8 Depth: (cm) 0.2 Area: (cm) 0.44 Volume: (cm) 0.088 % Reduction in Area: 63.6% % Reduction in Volume: 27.3% Epithelialization: None Tunneling: No Undermining: No Wound Description Classification: Category/Stage II Foul Odor A Diabetic Severity (Wagner): Grade 1 Wound Margin: Flat and Intact Exudate Amount: Small fter Cleansing: No Wound Bed Granulation Amount: Large (67-100%) Exposed Structure Granulation Quality: Pink, Pale Fascia Exposed: No Necrotic Amount: Small (1-33%) Fat Layer Exposed: No Necrotic Quality: Adherent Slough Tendon Exposed: No BATHSHEBA, BONANNO ([id number]) Muscle Exposed: No Joint Exposed: No Bone Exposed: No Limited to Skin Breakdown Periwound Skin Texture Texture Color No Abnormalities Noted: No No  Abnormalities Noted: No Callus: Yes Atrophie Blanche: No Crepitus: No Cyanosis: No Excoriation: No Ecchymosis: No Fluctuance: No Erythema: No Friable: No Hemosiderin Staining: No Induration: No Mottled: No Localized Edema: No Pallor: No Rash: No Rubor: No Scarring: No Temperature / Pain Moisture Tenderness on Palpation: Yes No Abnormalities Noted: No Dry / Scaly: No Maceration: Yes Moist: Yes Wound Preparation Ulcer Cleansing: Rinsed/Irrigated with Saline Topical Anesthetic Applied: Other: lidocaine 4%, Electronic Signature(s) Signed: 09/24/2015 1:41:16 PM By: Regan Lemming BSN, RN Entered By: Regan Lemming on 09/24/2015 10:35:24 [name] ([id number]) -------------------------------------------------------------------------------- Vitals Details Patient Name: [name] Date of Service: 09/24/2015 10:45 AM Medical Record Number: [id number] Patient Account Number: [id number] Date of Birth/Sex: 06-Dec-1935 (80 y.o. Female) Treating RN: Afful, RN, BSN, [place] Primary Care Physician: Halina Maidens Other Clinician: Referring Physician: Halina Maidens Treating Physician/Extender: Benjaman Pott in Treatment: 9 Vital Signs Time Taken: 10:32 Temperature (F): 97.9 Height (in): 64 Pulse (bpm): 63 Weight (lbs): 124 Respiratory Rate (breaths/min): 17 Body Mass Index (BMI): 21.3 Blood Pressure (mmHg): 129/47 Reference Range: 80 - 120 mg / dl Electronic Signature(s) Signed: 09/24/2015 1:41:16 PM By: Regan Lemming BSN, RN Entered By: Regan Lemming on 09/24/2015 10:31:52

## 2015-09-24 NOTE — Progress Notes (Signed)
See I heal note 

## 2015-09-27 NOTE — Progress Notes (Signed)
Madison, [name] ([id number]) Visit Report for 09/24/2015 Chief Complaint Document Details Patient Name: [name], [name] 09/24/2015 10:45 Date of Service: AM Medical Record [id number] Number: Patient Account Number: [id number] 1935-11-21 (80 y.o. Afful, RN, BSN, Date of Birth/Sex: Treating RN: Female) [name] Primary Care Physician: Halina Maidens Other Clinician: Referring Physician: Halina Maidens Treating Jerline Pain, Tabbetha Kutscher Physician/Extender: Suella Grove in Treatment: 9 Information Obtained from: Patient Chief Complaint Patients presents for treatment of an open diabetic ulcer which is also sure related and has been there for about a month Electronic Signature(s) Signed: 09/24/2015 11:33:32 AM By: Judene Companion MD Entered By: Judene Companion on 09/24/2015 11:33:31 [name] ([id number]) -------------------------------------------------------------------------------- HPI Details Patient Name: [name], [name] 09/24/2015 10:45 Date of Service: AM Medical Record [id number] Number: Patient Account Number: [id number] Aug 25, 1935 (80 y.o. Afful, RN, BSN, Date of Birth/Sex: Treating RN: Female) [name] Primary Care Physician: Halina Maidens Other Clinician: Referring Physician: Halina Maidens Treating Jerline Pain, Legacy [place] Physician/Extender: Suella Grove in Treatment: 9 History of Present Illness Location: left posterior heel Quality: Patient reports experiencing a sharp pain to affected area(s). Severity: Patient states wound (s) are getting better. Duration: Patient has had the wound for < 4 weeks prior to presenting for treatment Timing: Pain in wound is Intermittent (comes and goes Context: The wound appeared gradually over time Modifying Factors: Consults to this date include:was seen by her PCP and put on some antibiotics a while ago Associated Signs and Symptoms: Patient reports having difficulty standing for long periods. HPI Description: 80 year old patient referred to [place] by her  PCP Dr. Halina Maidens for a pressure ulcer to the left heel which she's had for about a month. She has a past medical history of COPD, diabetes mellitus type 2 with chronic kidney disease, multi-infarct dementia, anxiety, urinary retention, paroxysmal atrial fibrillation. She has also had moderate malnutrition, multi-infarct dementia, congestive heart failure, left displaced femoral neck fracture, systemic lupus erythematosus. he is also status total abdominal hysterectomy with bilateral salpingo-oophorectomy, EGDs, hip arthroplasty on the left, cataract surgery. She has been a from a smoker and quit in May 2013 and she smoked for about 40 years. recently admitted to [facility] between January 19 and 07/03/2015. x-ray of the left foot done on 07/02/2015 showed no acute abnormality. the dry ulcer on the left heel was treated with Keflex and in the past but there was no specific treatment given and there was no drainage. 08/02/2015 -- patient says this heel is extremely tender and last week after her debridement was done she had a lot of tenderness and compares it to worse than childbirth and kidney stones. 08/23/2015 -- the patient has not been seen here for about 3 weeks but is in a much better state of mind today and seems to be having less pain. He has a caregiver today who says the patient was down with significant upper respiratory infection. Electronic Signature(s) Signed: 09/24/2015 11:33:49 AM By: Judene Companion MD Entered By: Judene Companion on 09/24/2015 11:33:49 [name] ([id number]) -------------------------------------------------------------------------------- Physical Exam Details Patient Name: [name], [name] 09/24/2015 10:45 Date of Service: AM Medical Record [id number] Number: Patient Account Number: [id number] 11-09-1935 (80 y.o. Afful, RN, BSN, Date of Birth/Sex: Treating RN: Female) [name] Primary Care Physician: Halina Maidens Other  Clinician: Referring Physician: Manuella Ghazi, Brooks Stotz Physician/Extender: Suella Grove in Treatment: 9 Electronic Signature(s) Signed: 09/24/2015 11:33:58 AM By: Judene Companion MD Entered By: Judene Companion on 09/24/2015 11:33:58 [name] ([id number]) -------------------------------------------------------------------------------- Physician Orders Details Patient Name: [name]  09/24/2015 10:45 Date of Service: AM Medical Record [id number] Number: Patient Account Number: [id number] [birth year] (80 y.o. Treating RN: [name] Date of Birth/Sex: Female) Other Clinician: Primary Care Physician: Manuella Ghazi, [place] Referring Physician: Halina Maidens Physician/Extender: Suella Grove in Treatment: 9 Verbal / Phone Orders: Yes Clinician: [name] Read Back and Verified: Yes Diagnosis Coding Wound Cleansing Wound #1 Left Calcaneus o Cleanse wound with mild soap and water Primary Wound Dressing Wound #1 Left Calcaneus o Prisma Ag Secondary Dressing Wound #1 Left Calcaneus o Boardered Foam Dressing Dressing Change Frequency Wound #1 Left Calcaneus o Change dressing every other day. Follow-up Appointments Wound #1 Left Calcaneus o Return Appointment in 2 weeks. Off-Loading Wound #1 Left Calcaneus o Other: - keep pressure off heel Home Health Wound #1 Left Calcaneus o Continue Home Health Visits - [place] Nurse may visit PRN to address patientos wound care needs. o FACE TO FACE ENCOUNTER: MEDICARE and MEDICAID PATIENTS: I certify that this patient is under my care and that I had a face-to-face encounter that meets the physician face-to-face encounter requirements with this patient on this date. The encounter with the patient was in whole or in part for the following MEDICAL CONDITION: (primary reason for [place]) MEDICAL NECESSITY: I certify, that based on my findings, NURSING services  are a medically TRESIA, FRONEK ([id number]) necessary home health service. HOME BOUND STATUS: I certify that my clinical findings support that this patient is homebound (i.e., Due to illness or injury, pt requires aid of supportive devices such as crutches, cane, wheelchairs, walkers, the use of special transportation or the assistance of another person to leave their place of residence. There is a normal inability to leave the home and doing so requires considerable and taxing effort. Other absences are for medical reasons / religious services and are infrequent or of short duration when for other reasons). o If current dressing causes regression in wound condition, may D/C ordered dressing product/s and apply Normal Saline Moist Dressing daily until next [place] / Other MD appointment. [place] of regression in wound condition at [phone]. o Please direct any NON-WOUND related issues/requests for orders to patient's Primary Care Physician Electronic Signature(s) Signed: 09/24/2015 1:41:43 PM By: [name] Signed: 09/27/2015 3:38:49 PM By: Judene Companion MD Entered By: [name] on 09/24/2015 10:48:23 [name] ([id number]) -------------------------------------------------------------------------------- Problem List Details Patient Name: [name], [name] 09/24/2015 10:45 Date of Service: AM Medical Record [id number] Number: Patient Account Number: [id number] July 08, 1935 (80 y.o. Afful, RN, BSN, Date of Birth/Sex: Treating RN: Female) [name] Primary Care Physician: Halina Maidens Other Clinician: Referring Physician: Halina Maidens Treating Jerline Pain, Bentlee Drier Physician/Extender: Suella Grove in Treatment: 9 Active Problems ICD-10 Encounter Code Description Active Date Diagnosis E11.621 Type 2 diabetes mellitus with foot ulcer 07/23/2015 Yes L89.623 Pressure ulcer of left heel, stage 3 07/23/2015 Yes Z79.01 Long term (current) use of  anticoagulants 07/23/2015 Yes Inactive Problems Resolved Problems Electronic Signature(s) Signed: 09/24/2015 11:33:24 AM By: Judene Companion MD Entered By: Judene Companion on 09/24/2015 11:33:23 [name] ([id number]) -------------------------------------------------------------------------------- Progress Note Details Patient Name: [name], [name] 09/24/2015 10:45 Date of Service: AM Medical Record [id number] Number: Patient Account Number: [id number] October 07, 1935 (80 y.o. Afful, RN, BSN, Date of Birth/Sex: Treating RN: Female) [name] Primary Care Physician: Halina Maidens Other Clinician: Referring Physician: Halina Maidens Treating Jerline Pain, Aamiyah Derrick Physician/Extender: Suella Grove in Treatment: 9 Subjective Chief Complaint Information obtained from Patient Patients presents for treatment of an open diabetic ulcer which is  also sure related and has been there for about a month History of Present Illness (HPI) The following HPI elements were documented for the patient's wound: Location: left posterior heel Quality: Patient reports experiencing a sharp pain to affected area(s). Severity: Patient states wound (s) are getting better. Duration: Patient has had the wound for < 4 weeks prior to presenting for treatment Timing: Pain in wound is Intermittent (comes and goes Context: The wound appeared gradually over time Modifying Factors: Consults to this date include:was seen by her PCP and put on some antibiotics a while ago Associated Signs and Symptoms: Patient reports having difficulty standing for long periods. 80 year old patient referred to [place] by her PCP Dr. Halina Maidens for a pressure ulcer to the left heel which she's had for about a month. She has a past medical history of COPD, diabetes mellitus type 2 with chronic kidney disease, multi-infarct dementia, anxiety, urinary retention, paroxysmal atrial fibrillation. She has also had moderate malnutrition, multi-infarct  dementia, congestive heart failure, left displaced femoral neck fracture, systemic lupus erythematosus. he is also status total abdominal hysterectomy with bilateral salpingo-oophorectomy, EGDs, hip arthroplasty on the left, cataract surgery. She has been a from a smoker and quit in May 2013 and she smoked for about 40 years. recently admitted to [facility] between January 19 and 07/03/2015. x-ray of the left foot done on 07/02/2015 showed no acute abnormality. the dry ulcer on the left heel was treated with Keflex and in the past but there was no specific treatment given and there was no drainage. 08/02/2015 -- patient says this heel is extremely tender and last week after her debridement was done she had a lot of tenderness and compares it to worse than childbirth and kidney stones. 08/23/2015 -- the patient has not been seen here for about 3 weeks but is in a much better state of mind today and seems to be having less pain. He has a caregiver today who says the patient was down with significant upper respiratory infection. [name] ([id number]) Objective Constitutional Vitals Time Taken: 10:32 AM, Height: 64 in, Weight: 124 lbs, BMI: 21.3, Temperature: 97.9 F, Pulse: 63 bpm, Respiratory Rate: 17 breaths/min, Blood Pressure: 129/47 mmHg. Integumentary (Hair, Skin) Wound #1 status is Open. Original cause of wound was Pressure Injury. The wound is located on the Left Calcaneus. The wound measures 0.7cm length x 0.8cm width x 0.2cm depth; 0.44cm^2 area and 0.088cm^3 volume. The wound is limited to skin breakdown. There is no tunneling or undermining noted. There is a small amount of drainage noted. The wound margin is flat and intact. There is large (67-100%) pink, pale granulation within the wound bed. There is a small (1-33%) amount of necrotic tissue within the wound bed including Adherent Slough. The periwound skin appearance exhibited: Callus,  Maceration, Moist. The periwound skin appearance did not exhibit: Crepitus, Excoriation, Fluctuance, Friable, Induration, Localized Edema, Rash, Scarring, Dry/Scaly, Atrophie Blanche, Cyanosis, Ecchymosis, Hemosiderin Staining, Mottled, Pallor, Rubor, Erythema. The periwound has tenderness on palpation. Assessment Active Problems ICD-10 E11.621 - Type 2 diabetes mellitus with foot ulcer L89.623 - Pressure ulcer of left heel, stage 3 Z79.01 - Long term (current) use of anticoagulants Plan Wound Cleansing: Wound #1 Left Calcaneus: Cleanse wound with mild soap and water Primary Wound Dressing: Wound #1 Left Calcaneus: Prisma Ag [name] ([id number]) Secondary Dressing: Wound #1 Left Calcaneus: Boardered Foam Dressing Dressing Change Frequency: Wound #1 Left Calcaneus: Change dressing every other day. Follow-up Appointments: Wound #1 Left  Calcaneus: Return Appointment in 2 weeks. Off-Loading: Wound #1 Left Calcaneus: Other: - keep pressure off heel Home Health: Wound #1 Left Calcaneus: Continue Home Health Visits - [place] Nurse may visit PRN to address patient s wound care needs. FACE TO FACE ENCOUNTER: MEDICARE and MEDICAID PATIENTS: I certify that this patient is under my care and that I had a face-to-face encounter that meets the physician face-to-face encounter requirements with this patient on this date. The encounter with the patient was in whole or in part for the following MEDICAL CONDITION: (primary reason for [place]) MEDICAL NECESSITY: I certify, that based on my findings, NURSING services are a medically necessary home health service. HOME BOUND STATUS: I certify that my clinical findings support that this patient is homebound (i.e., Due to illness or injury, pt requires aid of supportive devices such as crutches, cane, wheelchairs, walkers, the use of special transportation or the assistance of another person to leave their place of  residence. There is a normal inability to leave the home and doing so requires considerable and taxing effort. Other absences are for medical reasons / religious services and are infrequent or of short duration when for other reasons). If current dressing causes regression in wound condition, may D/C ordered dressing product/s and apply Normal Saline Moist Dressing daily until next [place] / Other MD appointment. [place] of regression in wound condition at [phone]. Please direct any NON-WOUND related issues/requests for orders to patient's Primary Care Physician Follow-Up Appointments: A Patient Clinical Summary of Care was provided to [facility] Left heel with stage 3 pressure ulcer. Continue with Santyldaily. dressings . 4 cm size Electronic Signature(s) Signed: 09/24/2015 11:36:29 AM By: Judene Companion MD Entered By: Judene Companion on 09/24/2015 11:36:29 [name] ([id number]) -------------------------------------------------------------------------------- SuperBill Details Patient Name: [name] Date of Service: 09/24/2015 Medical Record Patient Account Number: [id number] Number: Afful, RN, BSN, Treating RN: June 04, 1936 (80 y.o. [name] Date of Birth/Sex: Female) Other Clinician: Primary Care Physician: Halina Maidens Treating Jerline Pain, [place] Referring Physician: Halina Maidens Physician/Extender: Suella Grove in Treatment: 9 Diagnosis Coding ICD-10 Codes Code Description E11.621 Type 2 diabetes mellitus with foot ulcer L89.623 Pressure ulcer of left heel, stage 3 Z79.01 Long term (current) use of anticoagulants Facility Procedures CPT4 Code: [id number] Description: 99213 - WOUND CARE VISIT-LEV 3 EST PT Modifier: Quantity: 1 Physician Procedures CPT4 Code: [id number] Description: 99213 - WC PHYS LEVEL 3 - EST PT ICD-10 Description Diagnosis L89.623 Pressure ulcer of left heel, stage 3 Modifier: Quantity: 1 Electronic  Signature(s) Signed: 09/27/2015 3:38:49 PM By: Judene Companion MD Previous Signature: 09/24/2015 11:37:01 AM Version By: Judene Companion MD Entered By: Sharon Mt on 09/27/2015 15:37:14

## 2015-09-28 DIAGNOSIS — Z96642 Presence of left artificial hip joint: Secondary | ICD-10-CM | POA: Diagnosis not present

## 2015-09-29 DIAGNOSIS — I5032 Chronic diastolic (congestive) heart failure: Secondary | ICD-10-CM | POA: Diagnosis not present

## 2015-09-29 DIAGNOSIS — L89622 Pressure ulcer of left heel, stage 2: Secondary | ICD-10-CM | POA: Diagnosis not present

## 2015-09-29 DIAGNOSIS — J449 Chronic obstructive pulmonary disease, unspecified: Secondary | ICD-10-CM | POA: Diagnosis not present

## 2015-09-29 DIAGNOSIS — E1140 Type 2 diabetes mellitus with diabetic neuropathy, unspecified: Secondary | ICD-10-CM | POA: Diagnosis not present

## 2015-09-29 DIAGNOSIS — F0390 Unspecified dementia without behavioral disturbance: Secondary | ICD-10-CM | POA: Diagnosis not present

## 2015-09-29 DIAGNOSIS — R339 Retention of urine, unspecified: Secondary | ICD-10-CM | POA: Diagnosis not present

## 2015-10-01 DIAGNOSIS — E1140 Type 2 diabetes mellitus with diabetic neuropathy, unspecified: Secondary | ICD-10-CM | POA: Diagnosis not present

## 2015-10-01 DIAGNOSIS — L89622 Pressure ulcer of left heel, stage 2: Secondary | ICD-10-CM | POA: Diagnosis not present

## 2015-10-01 DIAGNOSIS — J449 Chronic obstructive pulmonary disease, unspecified: Secondary | ICD-10-CM | POA: Diagnosis not present

## 2015-10-01 DIAGNOSIS — F0390 Unspecified dementia without behavioral disturbance: Secondary | ICD-10-CM | POA: Diagnosis not present

## 2015-10-01 DIAGNOSIS — I5032 Chronic diastolic (congestive) heart failure: Secondary | ICD-10-CM | POA: Diagnosis not present

## 2015-10-01 DIAGNOSIS — R339 Retention of urine, unspecified: Secondary | ICD-10-CM | POA: Diagnosis not present

## 2015-10-05 DIAGNOSIS — L89622 Pressure ulcer of left heel, stage 2: Secondary | ICD-10-CM | POA: Diagnosis not present

## 2015-10-05 DIAGNOSIS — F0390 Unspecified dementia without behavioral disturbance: Secondary | ICD-10-CM | POA: Diagnosis not present

## 2015-10-05 DIAGNOSIS — I5032 Chronic diastolic (congestive) heart failure: Secondary | ICD-10-CM | POA: Diagnosis not present

## 2015-10-05 DIAGNOSIS — J449 Chronic obstructive pulmonary disease, unspecified: Secondary | ICD-10-CM | POA: Diagnosis not present

## 2015-10-05 DIAGNOSIS — R339 Retention of urine, unspecified: Secondary | ICD-10-CM | POA: Diagnosis not present

## 2015-10-05 DIAGNOSIS — E1140 Type 2 diabetes mellitus with diabetic neuropathy, unspecified: Secondary | ICD-10-CM | POA: Diagnosis not present

## 2015-10-06 ENCOUNTER — Ambulatory Visit (INDEPENDENT_AMBULATORY_CARE_PROVIDER_SITE_OTHER): Payer: Medicare Other | Admitting: Internal Medicine

## 2015-10-06 ENCOUNTER — Encounter: Payer: Self-pay | Admitting: Internal Medicine

## 2015-10-06 VITALS — BP 142/78 | HR 76 | Ht 64.0 in | Wt 109.4 lb

## 2015-10-06 DIAGNOSIS — N181 Chronic kidney disease, stage 1: Secondary | ICD-10-CM | POA: Diagnosis not present

## 2015-10-06 DIAGNOSIS — I639 Cerebral infarction, unspecified: Secondary | ICD-10-CM

## 2015-10-06 DIAGNOSIS — E1122 Type 2 diabetes mellitus with diabetic chronic kidney disease: Secondary | ICD-10-CM

## 2015-10-06 MED ORDER — METFORMIN HCL ER 500 MG PO TB24: 500.0000 mg | ORAL_TABLET | Freq: Every day | ORAL | Status: DC

## 2015-10-06 NOTE — Progress Notes (Signed)
Date:  10/06/2015   Name:  [name]   DOB:  09-Sep-1935   MRN:  [id number]   Chief Complaint: Diabetes Diabetes She presents for her follow-up diabetic visit. She has type 2 diabetes mellitus. Her disease course has been worsening. Hypoglycemia symptoms include confusion. Pertinent negatives for diabetes include no chest pain and no fatigue.  She continues [place] alone.  Her diet is good.  She has not taken any steroids.  Currently not using any supplements. Her weight is down 14 lbs.    Lab Results  Component Value Date   HGBA1C 7.4* 07/01/2015   Wt Readings from Last 3 Encounters:  10/06/15 109 lb 6.4 oz (49.624 kg)  08/18/15 114 lb 11.2 oz (52.028 kg)  07/16/15 124 lb (56.246 kg)    Review of Systems  Constitutional: Positive for unexpected weight change. Negative for fever, chills and fatigue.  Respiratory: Positive for shortness of breath. Negative for chest tightness and wheezing.   Cardiovascular: Negative for chest pain, palpitations and leg swelling.  Psychiatric/Behavioral: Positive for confusion. Negative for sleep disturbance.    Patient Active Problem List   Diagnosis Date Noted  . Type 2 diabetes mellitus with stage 1 chronic kidney disease, without long-term current use of insulin ([place]) 07/16/2015  . Tremor observed on examination 07/16/2015  . Urinary retention 07/12/2015  . Pressure ulcer of left heel 07/12/2015  . Malnutrition of moderate degree 07/03/2015  . Multi-infarct dementia 07/03/2015  . 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 Paroxysmal atrial fibrillation ([place]) 06/25/2015  . Chronic diastolic CHF (congestive heart failure) ([place])   . Left displaced femoral neck fracture ([place])   . Fall   . GERD (gastroesophageal reflux disease) 03/26/2015  . Anxiety 03/26/2015  . Gastroduodenal ulcer 01/05/2015  . Difficulty swallowing solids   . Esophageal candidiasis ([place])   . Acquired hypothyroidism 08/18/2014  . Systemic lupus erythematosus  ([place]) 04/03/2014  . On home oxygen therapy - prn 04/19/2013  . COPD, severe ([place]) 04/19/2013    Prior to Admission medications   Medication Sig Start Date End Date Taking? Authorizing Provider  albuterol (PROVENTIL) (2.5 MG/3ML) 0.083% nebulizer solution Take 2.5 mg by nebulization every 6 (six) hours as needed for wheezing or shortness of breath. Reported on 07/09/2015   Yes Historical Provider, MD  amiodarone (PACERONE) 200 MG tablet Take 1 tablet (200 mg total) by mouth daily. 06/22/15  Yes Glean Hess, MD  [facility] SENNA 8.6 MG tablet TAKE 1 TABLET (8.6 MG TOTAL) BY MOUTH 2 (TWO) TIMES DAILY. 04/20/15  Yes Historical Provider, MD  donepezil (ARICEPT) 5 MG tablet Take by mouth. Reported on 08/18/2015 07/29/15  Yes Historical Provider, MD  JANUVIA 100 MG tablet Take 0.5 tablets (50 mg total) by mouth daily. 06/22/15  Yes Glean Hess, MD  metoprolol tartrate (LOPRESSOR) 25 MG tablet Reported on 07/16/2015 06/01/15  Yes Historical Provider, MD  mometasone-formoterol (DULERA) 100-5 MCG/ACT AERO Inhale 2 puffs into the lungs 2 (two) times daily. 07/16/15  Yes Glean Hess, MD  Probiotic Product (ALIGN) 4 MG CAPS Take 1 capsule (4 mg total) by mouth daily. 06/22/15  Yes Glean Hess, MD  ranitidine (ZANTAC) 150 MG tablet Take 1 tablet (150 mg total) by mouth 2 (two) times daily. 06/22/15  Yes Glean Hess, MD  rivaroxaban (XARELTO) 20 MG TABS tablet Take 1 tablet (20 mg total) by mouth daily. 06/22/15  Yes Glean Hess, MD  tiotropium (SPIRIVA) 18 MCG inhalation capsule Place 1  capsule (18 mcg total) into inhaler and inhale daily. 06/22/15  Yes Glean Hess, MD  VENTOLIN HFA 108 [phone] Base) MCG/ACT inhaler INHALE 2 PUFFS INTO THE LUNGS 4 TIMES DAILY AFTER MEALS AND AT BEDTIME 09/06/15  Yes Glean Hess, MD    Allergies  Allergen Reactions  . Codeine Nausea And Vomiting  . Penicillins Hives  . Sulfa Antibiotics   . Doxycycline Rash  . Erythromycin Rash  . Latex Rash    Past  Surgical History  Procedure Laterality Date  . Esophagogastroduodenoscopy  multiple  . Colonoscopy  multiple  . Total abdominal hysterectomy w/ bilateral salpingoophorectomy  1990  . Breast biopsy    . Biopsy thyroid    . Esophagogastroduodenoscopy (egd) with propofol N/A 12/11/2014    Procedure: ESOPHAGOGASTRODUODENOSCOPY (EGD) WITH PROPOFOL;  Surgeon: [name], MD;  Location: [facility];  Service: Endoscopy;  Laterality: N/A;  cytology brushing  . Cataract extraction w/phaco Left 03/23/2015    Procedure: CATARACT EXTRACTION PHACO AND INTRAOCULAR LENS PLACEMENT (IOC);  Surgeon: Birder Robson, MD;  Location: [facility];  Service: Ophthalmology;  Laterality: Left;  [place] 00:51   . Hip arthroplasty Left 03/26/2015    Procedure: ARTHROPLASTY BIPOLAR HIP (HEMIARTHROPLASTY);  Surgeon: Earnestine Leys, MD;  Location: [facility];  Service: Orthopedics;  Laterality: Left;  . Cataract extraction w/phaco Right 05/25/2015    Procedure: CATARACT EXTRACTION PHACO AND INTRAOCULAR LENS PLACEMENT (IOC);  Surgeon: Birder Robson, MD;  Location: [facility];  Service: Ophthalmology;  Laterality: Right;  [place] 00:57     Social History  Substance Use Topics  . Smoking status: Former Smoker -- 0.25 packs/day for 40 years    Types: Cigarettes    Quit date: 10/11/2011  . Smokeless tobacco: Never Used     Comment: Quit 3 years  . Alcohol Use: No    Medication list has been reviewed and updated.  Physical Exam  Constitutional: She appears well-developed. No distress.  Neck: Normal range of motion. Neck supple. No tracheal tenderness present. No erythema present.  Cardiovascular: Normal rate, regular rhythm, S1 normal and normal heart sounds.   Pulmonary/Chest: She has decreased breath sounds. She has no wheezes. She has no rhonchi.  Abdominal: Soft. Normal appearance. There is no tenderness.  Lymphadenopathy:    She has no cervical adenopathy.  Neurological: She is alert.  Skin: Skin is warm and dry.    Psychiatric: She has a normal mood and affect. Cognition and memory are impaired. She exhibits abnormal recent memory.    BP 142/78 mmHg  Pulse 76  Ht 5\' 4"  (1.626 m)  Wt 109 lb 6.4 oz (49.624 kg)  BMI 18.77 kg/m2  Assessment and Plan: 1. Type 2 diabetes mellitus with stage 1 chronic kidney disease, without long-term current use of insulin ([place]) I suspect she will need insulin but her husband requests a trial of metformin He will call in 2-3 weeks to report blood sugars - metFORMIN (GLUCOPHAGE-XR) 500 MG 24 hr tablet; Take 1 tablet (500 mg total) by mouth daily before supper.  Dispense: 30 tablet; Refill: 1 - Hemoglobin [id number] - Basic metabolic panel   Halina Maidens, MD [facility]  10/06/2015

## 2015-10-06 NOTE — Patient Instructions (Signed)
Call in 2-3 weeks to report progress.

## 2015-10-07 ENCOUNTER — Telehealth: Payer: Self-pay

## 2015-10-07 DIAGNOSIS — I5032 Chronic diastolic (congestive) heart failure: Secondary | ICD-10-CM | POA: Diagnosis not present

## 2015-10-07 DIAGNOSIS — R339 Retention of urine, unspecified: Secondary | ICD-10-CM | POA: Diagnosis not present

## 2015-10-07 DIAGNOSIS — F0390 Unspecified dementia without behavioral disturbance: Secondary | ICD-10-CM | POA: Diagnosis not present

## 2015-10-07 DIAGNOSIS — E1140 Type 2 diabetes mellitus with diabetic neuropathy, unspecified: Secondary | ICD-10-CM | POA: Diagnosis not present

## 2015-10-07 DIAGNOSIS — L89622 Pressure ulcer of left heel, stage 2: Secondary | ICD-10-CM | POA: Diagnosis not present

## 2015-10-07 DIAGNOSIS — J449 Chronic obstructive pulmonary disease, unspecified: Secondary | ICD-10-CM | POA: Diagnosis not present

## 2015-10-07 LAB — BASIC METABOLIC PANEL
BUN / CREAT RATIO: 21 (ref 12–28)
BUN: 19 mg/dL (ref 8–27)
CO2: 26 mmol/L (ref 18–29)
CREATININE: 0.92 mg/dL (ref 0.57–1.00)
Calcium: 9.7 mg/dL (ref 8.7–10.3)
Chloride: 94 mmol/L — ABNORMAL LOW (ref 96–106)
GFR calc Af Amer: 69 mL/min/{1.73_m2} (ref >59)
GFR, EST NON AFRICAN AMERICAN: 60 mL/min/{1.73_m2} (ref >59)
GLUCOSE: 200 mg/dL — AB (ref 65–99)
Potassium: 4.5 mmol/L (ref 3.5–5.2)
Sodium: 139 mmol/L (ref 134–144)

## 2015-10-07 LAB — HEMOGLOBIN A1C
Est. average glucose Bld gHb Est-mCnc: 200 mg/dL
Hgb A1c MFr Bld: 8.6 % — ABNORMAL HIGH (ref 4.8–5.6)

## 2015-10-07 NOTE — Telephone Encounter (Signed)
Spoke with patient. Patient advised of all results and verbalized understanding. Will call back with any future questions or concerns. MAH  

## 2015-10-07 NOTE — Telephone Encounter (Signed)
-----   Message from Glean Hess, MD sent at 10/07/2015  1:55 PM EDT ----- DM is up some to A1C 8.6 from 7.4.  Kidney function is normal.

## 2015-10-08 ENCOUNTER — Encounter: Payer: Medicare Other | Admitting: Surgery

## 2015-10-08 DIAGNOSIS — J449 Chronic obstructive pulmonary disease, unspecified: Secondary | ICD-10-CM | POA: Diagnosis not present

## 2015-10-08 DIAGNOSIS — Z7901 Long term (current) use of anticoagulants: Secondary | ICD-10-CM | POA: Diagnosis not present

## 2015-10-08 DIAGNOSIS — E11621 Type 2 diabetes mellitus with foot ulcer: Secondary | ICD-10-CM | POA: Diagnosis not present

## 2015-10-08 DIAGNOSIS — Z09 Encounter for follow-up examination after completed treatment for conditions other than malignant neoplasm: Secondary | ICD-10-CM | POA: Diagnosis not present

## 2015-10-08 DIAGNOSIS — L89623 Pressure ulcer of left heel, stage 3: Secondary | ICD-10-CM | POA: Diagnosis not present

## 2015-10-08 DIAGNOSIS — F0390 Unspecified dementia without behavioral disturbance: Secondary | ICD-10-CM | POA: Diagnosis not present

## 2015-10-08 DIAGNOSIS — Z872 Personal history of diseases of the skin and subcutaneous tissue: Secondary | ICD-10-CM | POA: Diagnosis not present

## 2015-10-08 DIAGNOSIS — E1122 Type 2 diabetes mellitus with diabetic chronic kidney disease: Secondary | ICD-10-CM | POA: Diagnosis not present

## 2015-10-09 NOTE — Progress Notes (Signed)
[name], [name] ([id number]) Visit Report for 10/08/2015 Arrival Information Details Patient Name: [name], [name]. Date of Service: 10/08/2015 1:30 PM Medical Record Number: [id number] Patient Account Number: [id number] Date of Birth/Sex: August 10, 1935 (80 y.o. Female) Treating RN: Baruch Gouty, RN, BSN, [name] Primary Care Physician: Halina Maidens Other Clinician: Referring Physician: Halina Maidens Treating Physician/Extender: Frann Rider in Treatment: 11 Visit Information History Since Last Visit Added or deleted any medications: No Patient Arrived: Wheel Chair Any new allergies or adverse reactions: No Arrival Time: 13:23 Had a fall or experienced change in No Accompanied By: caregiver activities of daily living that may affect Transfer Assistance: None risk of falls: Patient Identification Verified: Yes Signs or symptoms of abuse/neglect since last No Secondary Verification Process Yes visito Completed: Has Dressing in Place as Prescribed: Yes Patient Has Alerts: Yes Pain Present Now: No Patient Alerts: Patient on Blood Thinner Electronic Signature(s) Signed: 10/08/2015 4:56:55 PM By: Regan Lemming BSN, RN Entered By: Regan Lemming on 10/08/2015 13:23:30 [name] ([id number]) -------------------------------------------------------------------------------- Clinic Level of Care Assessment Details Patient Name: [name] Date of Service: 10/08/2015 1:30 PM Medical Record Number: [id number] Patient Account Number: [id number] Date of Birth/Sex: 1936/02/06 (80 y.o. Female) Treating RN: Afful, RN, BSN, [place] Primary Care Physician: Halina Maidens Other Clinician: Referring Physician: Halina Maidens Treating Physician/Extender: Frann Rider in Treatment: 11 Clinic Level of Care Assessment Items TOOL 4 Quantity Score []  - Use when only an EandM is performed on FOLLOW-UP visit 0 ASSESSMENTS - Nursing Assessment / Reassessment X - Reassessment of  Co-morbidities (includes updates in patient status) 1 10 X - Reassessment of Adherence to Treatment Plan 1 5 ASSESSMENTS - Wound and Skin Assessment / Reassessment X - Simple Wound Assessment / Reassessment - one wound 1 5 []  - Complex Wound Assessment / Reassessment - multiple wounds 0 []  - Dermatologic / Skin Assessment (not related to wound area) 0 ASSESSMENTS - Focused Assessment []  - Circumferential Edema Measurements - multi extremities 0 []  - Nutritional Assessment / Counseling / Intervention 0 X - Lower Extremity Assessment (monofilament, tuning fork, pulses) 1 5 []  - Peripheral Arterial Disease Assessment (using hand held doppler) 0 ASSESSMENTS - Ostomy and/or Continence Assessment and Care []  - Incontinence Assessment and Management 0 []  - Ostomy Care Assessment and Management (repouching, etc.) 0 PROCESS - Coordination of Care X - Simple Patient / Family Education for ongoing care 1 15 []  - Complex (extensive) Patient / Family Education for ongoing care 0 []  - Staff obtains Programmer, systems, Records, Test Results / Process Orders 0 []  - Staff telephones HHA, Nursing Homes / Clarify orders / etc 0 []  - Routine Transfer to another Facility (non-emergent condition) 0 [name], [name] ([id number]) []  - Routine Hospital Admission (non-emergent condition) 0 []  - New Admissions / Biomedical engineer / Ordering NPWT, Apligraf, etc. 0 []  - Emergency Hospital Admission (emergent condition) 0 []  - Simple Discharge Coordination 0 []  - Complex (extensive) Discharge Coordination 0 PROCESS - Special Needs []  - Pediatric / Minor Patient Management 0 []  - Isolation Patient Management 0 []  - Hearing / Language / Visual special needs 0 []  - Assessment of Community assistance (transportation, D/C planning, etc.) 0 []  - Additional assistance / Altered mentation 0 []  - Support Surface(s) Assessment (bed, cushion, seat, etc.) 0 INTERVENTIONS - Wound Cleansing / Measurement []  - Simple Wound  Cleansing - one wound 0 []  - Complex Wound Cleansing - multiple wounds 0 X - Wound Imaging (photographs - any number of wounds) 1 5 []  -  Wound Tracing (instead of photographs) 0 X - Simple Wound Measurement - one wound 1 5 []  - Complex Wound Measurement - multiple wounds 0 INTERVENTIONS - Wound Dressings []  - Small Wound Dressing one or multiple wounds 0 []  - Medium Wound Dressing one or multiple wounds 0 []  - Large Wound Dressing one or multiple wounds 0 []  - Application of Medications - topical 0 []  - Application of Medications - injection 0 INTERVENTIONS - Miscellaneous []  - External ear exam 0 [name], [name] ([id number]) []  - Specimen Collection (cultures, biopsies, blood, body fluids, etc.) 0 []  - Specimen(s) / Culture(s) sent or taken to Lab for analysis 0 []  - Patient Transfer (multiple staff / Harrel Lemon Lift / Similar devices) 0 []  - Simple Staple / Suture removal (25 or less) 0 []  - Complex Staple / Suture removal (26 or more) 0 []  - Hypo / Hyperglycemic Management (close monitor of Blood Glucose) 0 []  - Ankle / Brachial Index (ABI) - do not check if billed separately 0 X - Vital Signs 1 5 Has the patient been seen at the hospital within the last three years: Yes Total Score: 55 Level Of Care: New/Established - Level 2 Electronic Signature(s) Signed: 10/08/2015 1:46:51 PM By: Regan Lemming BSN, RN Entered By: Regan Lemming on 10/08/2015 13:46:51 [name] ([id number]) -------------------------------------------------------------------------------- Encounter Discharge Information Details Patient Name: [name] Date of Service: 10/08/2015 1:30 PM Medical Record Number: [id number] Patient Account Number: [id number] Date of Birth/Sex: 1935-07-13 (80 y.o. Female) Treating RN: Baruch Gouty, RN, BSN, [name] Primary Care Physician: Halina Maidens Other Clinician: Referring Physician: Halina Maidens Treating Physician/Extender: Frann Rider in Treatment: 11 Encounter  Discharge Information Items Discharge Pain Level: 0 Discharge Condition: Stable Ambulatory Status: Wheelchair Discharge Destination: Home Transportation: Private Auto Accompanied By: caregiver Schedule Follow-up Appointment: No Medication Reconciliation completed and provided to Patient/Care No [name]: Provided on Clinical Summary of Care: 10/08/2015 Form Type Recipient Paper Patient LK Electronic Signature(s) Signed: 10/08/2015 1:49:22 PM By: Regan Lemming BSN, RN Previous Signature: 10/08/2015 1:42:16 PM Version By: Ruthine Dose Entered By: Regan Lemming on 10/08/2015 13:49:22 [name] ([id number]) -------------------------------------------------------------------------------- Lower Extremity Assessment Details Patient Name: [name] Date of Service: 10/08/2015 1:30 PM Medical Record Number: [id number] Patient Account Number: [id number] Date of Birth/Sex: December 05, 1935 (79 y.o. Female) Treating RN: Baruch Gouty, RN, BSN, [name] Primary Care Physician: Halina Maidens Other Clinician: Referring Physician: Halina Maidens Treating Physician/Extender: Frann Rider in Treatment: 11 Vascular Assessment Pulses: Posterior Tibial Dorsalis Pedis Palpable: [Left:Yes] Extremity colors, hair growth, and conditions: Extremity Color: [Left:Normal] Hair Growth on Extremity: [Left:No] Temperature of Extremity: [Left:Warm] Capillary Refill: [Left:< 3 seconds] Toe Nail Assessment Left: Right: Thick: No Discolored: No Deformed: No Improper Length and Hygiene: No Electronic Signature(s) Signed: 10/08/2015 4:56:55 PM By: Regan Lemming BSN, RN Entered By: Regan Lemming on 10/08/2015 13:23:56 [name] ([id number]) -------------------------------------------------------------------------------- Multi Wound Chart Details Patient Name: [name] Date of Service: 10/08/2015 1:30 PM Medical Record Number: [id number] Patient Account Number: [id number] Date of Birth/Sex:  [birth year] (79 y.o. Female) Treating RN: Baruch Gouty, RN, BSN, [name] Primary Care Physician: Halina Maidens Other Clinician: Referring Physician: Halina Maidens Treating Physician/Extender: Frann Rider in Treatment: 11 Vital Signs Height(in): 64 Pulse(bpm): 73 Weight(lbs): 124 Blood Pressure 118/60 (mmHg): Body Mass Index(BMI): 21 Temperature(F): 97.7 Respiratory Rate 17 (breaths/min): Photos: [1:No Photos] [N/A:N/A] Wound Location: [1:Left Calcaneus] [N/A:N/A] Wounding Event: [1:Pressure Injury] [N/A:N/A] Primary Etiology: [1:Pressure Ulcer] [N/A:N/A] Comorbid History: [1:Chronic Obstructive Pulmonary Disease (COPD), Angina, Congestive Heart Failure, Type II  Diabetes, Lupus Erythematosus] [N/A:N/A] Date Acquired: [1:06/21/2015] [N/A:N/A] Weeks of Treatment: [1:11] [N/A:N/A] Wound Status: [1:Healed - Epithelialized] [N/A:N/A] Measurements L x W x D 0x0x0 [N/A:N/A] (cm) Area (cm) : [1:0] [N/A:N/A] Volume (cm) : [1:0] [N/A:N/A] % Reduction in Area: [1:100.00%] [N/A:N/A] % Reduction in Volume: 100.00% [N/A:N/A] Classification: [1:Category/Stage II] [N/A:N/A] HBO Classification: [1:Grade 1] [N/A:N/A] Exudate Amount: [1:None Present] [N/A:N/A] Wound Margin: [1:Flat and Intact] [N/A:N/A] Granulation Amount: [1:None Present (0%)] [N/A:N/A] Necrotic Amount: [1:None Present (0%)] [N/A:N/A] Exposed Structures: [1:Fascia: No Fat: No Tendon: No Muscle: No Joint: No] [N/A:N/A] Bone: No Limited to Skin Breakdown Epithelialization: Large (67-100%) N/A N/A Periwound Skin Texture: Callus: Yes N/A N/A Edema: No Excoriation: No Induration: No Crepitus: No Fluctuance: No Friable: No Rash: No Scarring: No Periwound Skin Dry/Scaly: Yes N/A N/A Moisture: Maceration: No Moist: No Periwound Skin Color: Atrophie Blanche: No N/A N/A Cyanosis: No Ecchymosis: No Erythema: No Hemosiderin Staining: No Mottled: No Pallor: No Rubor: No Tenderness on Yes N/A  N/A Palpation: Wound Preparation: Ulcer Cleansing: N/A N/A Rinsed/Irrigated with Saline Topical Anesthetic Applied: None Treatment Notes Electronic Signature(s) Signed: 10/08/2015 4:56:55 PM By: Regan Lemming BSN, RN Entered By: Regan Lemming on 10/08/2015 13:40:13 [name] ([id number]) -------------------------------------------------------------------------------- [place] Details Patient Name: [name]. Date of Service: 10/08/2015 1:30 PM Medical Record Number: [id number] Patient Account Number: [id number] Date of Birth/Sex: Oct 31, 1935 (79 y.o. Female) Treating RN: Afful, RN, BSN, [name] Primary Care Physician: Halina Maidens Other Clinician: Referring Physician: Halina Maidens Treating Physician/Extender: Frann Rider in Treatment: 11 Active Inactive Electronic Signature(s) Signed: 10/08/2015 1:45:52 PM By: Regan Lemming BSN, RN Entered By: Regan Lemming on 10/08/2015 13:45:51 [name] ([id number]) -------------------------------------------------------------------------------- Pain Assessment Details Patient Name: [name] Date of Service: 10/08/2015 1:30 PM Medical Record Number: [id number] Patient Account Number: [id number] Date of Birth/Sex: 09-26-1935 (79 y.o. Female) Treating RN: Baruch Gouty, RN, BSN, [name] Primary Care Physician: Halina Maidens Other Clinician: Referring Physician: Halina Maidens Treating Physician/Extender: Frann Rider in Treatment: 11 Active Problems Location of Pain Severity and Description of Pain Patient Has Paino No Site Locations Pain Management and Medication Current Pain Management: Electronic Signature(s) Signed: 10/08/2015 4:56:55 PM By: Regan Lemming BSN, RN Entered By: Regan Lemming on 10/08/2015 13:23:35 [name] ([id number]) -------------------------------------------------------------------------------- Patient/Caregiver Education Details Patient Name: [name] Date of Service: 10/08/2015 1:30 PM Medical Record Number: [id number] Patient Account Number: [id number] Date of Birth/Gender: [birth year] (80 y.o. Female) Treating RN: Baruch Gouty, RN, BSN, [name] Primary Care Physician: Halina Maidens Other Clinician: Referring Physician: Halina Maidens Treating Physician/Extender: Frann Rider in Treatment: 11 Education Assessment Education Provided To: Patient Education Topics Provided Wound/Skin Impairment: Methods: Explain/Verbal Responses: State content correctly [employer]) Signed: 10/08/2015 1:49:38 PM By: Regan Lemming BSN, RN Entered By: Regan Lemming on 10/08/2015 13:49:38 [name] ([id number]) -------------------------------------------------------------------------------- Wound Assessment Details Patient Name: [name] Date of Service: 10/08/2015 1:30 PM Medical Record Number: [id number] Patient Account Number: [id number] Date of Birth/Sex: 04/04/1936 (79 y.o. Female) Treating RN: Baruch Gouty, RN, BSN, [name] Primary Care Physician: Halina Maidens Other Clinician: Referring Physician: Halina Maidens Treating Physician/Extender: Frann Rider in Treatment: 11 Wound Status Wound Number: 1 Primary Pressure Ulcer Etiology: Wound Location: Left Calcaneus Wound Healed - Epithelialized Wounding Event: Pressure Injury Status: Date Acquired: 06/21/2015 Comorbid Chronic Obstructive Pulmonary Disease Weeks Of Treatment: 11 History: (COPD), Angina, Congestive Heart Clustered Wound: No Failure, Type II Diabetes, Lupus Erythematosus Photos Photo Uploaded By: Regan Lemming on 10/08/2015 16:46:05 Wound Measurements Length: (cm) 0 %  Reduction Width: (cm) 0 % Reduction Depth: (cm) 0 Epitheliali Area: (cm) 0 Tunneling: Volume: (cm) 0 Underminin in Area: 100% in Volume: 100% zation: Large (67-100%) No g: No Wound Description Classification: Category/Stage II Foul Odor Diabetic Severity (Wagner): Grade  1 Wound Margin: Flat and Intact Exudate Amount: None Present After Cleansing: No Wound Bed Granulation Amount: None Present (0%) Exposed Structure Necrotic Amount: None Present (0%) Fascia Exposed: No Fat Layer Exposed: No Tendon Exposed: No AYELENE, BULLINGER ([id number]) Muscle Exposed: No Joint Exposed: No Bone Exposed: No Limited to Skin Breakdown Periwound Skin Texture Texture Color No Abnormalities Noted: No No Abnormalities Noted: No Callus: Yes Atrophie Blanche: No Crepitus: No Cyanosis: No Excoriation: No Ecchymosis: No Fluctuance: No Erythema: No Friable: No Hemosiderin Staining: No Induration: No Mottled: No Localized Edema: No Pallor: No Rash: No Rubor: No Scarring: No Temperature / Pain Moisture Tenderness on Palpation: Yes No Abnormalities Noted: No Dry / Scaly: Yes Maceration: No Moist: No Wound Preparation Ulcer Cleansing: Rinsed/Irrigated with Saline Topical Anesthetic Applied: None Electronic Signature(s) Signed: 10/08/2015 4:56:55 PM By: Regan Lemming BSN, RN Entered By: Regan Lemming on 10/08/2015 13:40:03 [name] ([id number]) -------------------------------------------------------------------------------- Vitals Details Patient Name: [name] Date of Service: 10/08/2015 1:30 PM Medical Record Number: [id number] Patient Account Number: [id number] Date of Birth/Sex: 07/11/1935 (79 y.o. Female) Treating RN: Afful, RN, BSN, [place] Primary Care Physician: Halina Maidens Other Clinician: Referring Physician: Halina Maidens Treating Physician/Extender: Frann Rider in Treatment: 11 Vital Signs Time Taken: 13:27 Temperature (F): 97.7 Height (in): 64 Pulse (bpm): 73 Weight (lbs): 124 Respiratory Rate (breaths/min): 17 Body Mass Index (BMI): 21.3 Blood Pressure (mmHg): 118/60 Reference Range: 80 - 120 mg / dl Electronic Signature(s) Signed: 10/08/2015 4:56:55 PM By: Regan Lemming BSN, RN Entered By: Regan Lemming on  10/08/2015 13:27:21

## 2015-10-09 NOTE — Progress Notes (Signed)
RIHA, BRACKER ([id number]) Visit Report for 10/08/2015 Chief Complaint Document Details Patient Name: [name], [name]. Date of Service: 10/08/2015 1:30 PM Medical Record Patient Account Number: [id number] Number: Afful, RN, BSN, Treating RN: [birth year] (80 y.o. [name] Date of Birth/Sex: Female) Other Clinician: Primary Care Physician: Halina Maidens Treating [name] Referring Physician: Halina Maidens Physician/Extender: Suella Grove in Treatment: 11 Information Obtained from: Patient Chief Complaint Patients presents for treatment of an open diabetic ulcer which is also sure related and has been there for about a month Electronic Signature(s) Signed: 10/08/2015 1:47:22 PM By: [name], FACS Entered By: [name] on 10/08/2015 13:47:22 [name] ([id number]) -------------------------------------------------------------------------------- HPI Details Patient Name: [name] Date of Service: 10/08/2015 1:30 PM Medical Record Patient Account Number: [id number] Number: Afful, RN, BSN, Treating RN: 02-17-1936 (80 y.o. [name] Date of Birth/Sex: Female) Other Clinician: Primary Care Physician: Halina Maidens Treating [name] Referring Physician: Halina Maidens Physician/Extender: Suella Grove in Treatment: 11 History of Present Illness Location: left posterior heel Quality: Patient reports experiencing a sharp pain to affected area(s). Severity: Patient states wound (s) are getting better. Duration: Patient has had the wound for < 4 weeks prior to presenting for treatment Timing: Pain in wound is Intermittent (comes and goes Context: The wound appeared gradually over time Modifying Factors: Consults to this date include:was seen by her PCP and put on some antibiotics a while ago Associated Signs and Symptoms: Patient reports having difficulty standing for long periods. HPI Description: 80 year old patient referred to [place] by her PCP  Dr. Halina Maidens for a pressure ulcer to the left heel which she's had for about a month. She has a past medical history of COPD, diabetes mellitus type 2 with chronic kidney disease, multi-infarct dementia, anxiety, urinary retention, paroxysmal atrial fibrillation. She has also had moderate malnutrition, multi-infarct dementia, congestive heart failure, left displaced femoral neck fracture, systemic lupus erythematosus. he is also status total abdominal hysterectomy with bilateral salpingo-oophorectomy, EGDs, hip arthroplasty on the left, cataract surgery. She has been a from a smoker and quit in May 2013 and she smoked for about 40 years. recently admitted to [facility] between January 19 and 07/03/2015. x-ray of the left foot done on 07/02/2015 showed no acute abnormality. the dry ulcer on the left heel was treated with Keflex and in the past but there was no specific treatment given and there was no drainage. 08/02/2015 -- patient says this heel is extremely tender and last week after her debridement was done she had a lot of tenderness and compares it to worse than childbirth and kidney stones. 08/23/2015 -- the patient has not been seen here for about 3 weeks but is in a much better state of mind today and seems to be having less pain. He has a caregiver today who says the patient was down with significant upper respiratory infection. Electronic Signature(s) Signed: 10/08/2015 1:47:26 PM By: [name], FACS Entered By: [name] on 10/08/2015 13:47:26 [name] ([id number]) -------------------------------------------------------------------------------- Physical Exam Details Patient Name: [name] Date of Service: 10/08/2015 1:30 PM Medical Record Patient Account Number: [id number] Number: Afful, RN, BSN, Treating RN: 1936/01/29 (80 y.o. [name] Date of Birth/Sex: Female) Other Clinician: Primary Care Physician: Halina Maidens Treating [name] Referring Physician: Halina Maidens Physician/Extender: Suella Grove in Treatment: 11 Constitutional . Pulse regular. Respirations normal and unlabored. Afebrile. . Eyes Nonicteric. Reactive to light. Ears, Nose, Mouth, and Throat Lips, teeth, and gums WNL.Marland Kitchen Moist mucosa without  lesions. Neck supple and nontender. No palpable supraclavicular or cervical adenopathy. Normal sized without goiter. Respiratory WNL. No retractions.. Breath sounds WNL, No rubs, rales, rhonchi, or wheeze.. Cardiovascular Heart rhythm and rate regular, no murmur or gallop.. Pedal Pulses WNL. No clubbing, cyanosis or edema. Lymphatic No adneopathy. No adenopathy. No adenopathy. Musculoskeletal Adexa without tenderness or enlargement.. Digits and nails w/o clubbing, cyanosis, infection, petechiae, ischemia, or inflammatory conditions.. Integumentary (Hair, Skin) No suspicious lesions. No crepitus or fluctuance. No peri-wound warmth or erythema. No masses.Marland Kitchen Psychiatric Judgement and insight Intact.. No evidence of depression, anxiety, or agitation.. Notes on the left heel she has a supple scar and there is no evidence of any open wound or surrounding cellulitis. This is considered healed Electronic Signature(s) Signed: 10/08/2015 1:47:55 PM By: [name], FACS Entered By: [name] on 10/08/2015 13:47:54 [name] ([id number]) -------------------------------------------------------------------------------- Physician Orders Details Patient Name: [name] Date of Service: 10/08/2015 1:30 PM Medical Record Patient Account Number: [id number] Number: Afful, RN, BSN, Treating RN: May 07, 1936 (80 y.o. [name] Date of Birth/Sex: Female) Other Clinician: Primary Care Physician: Halina Maidens Treating [name] Referring Physician: Halina Maidens Physician/Extender: Suella Grove in Treatment: 11 Verbal / Phone Orders: Yes Clinician: Afful, RN, BSN,  Rita Read Back and Verified: Yes Diagnosis Coding Discharge From [facility] Services o Discharge from [place] - treatment Completed. patient understands to come back as needed for wound care needs. Electronic Signature(s) Signed: 10/08/2015 4:23:46 PM By: [name], FACS Signed: 10/08/2015 4:56:55 PM By: Regan Lemming BSN, RN Entered By: Regan Lemming on 10/08/2015 13:41:10 [name] ([id number]) -------------------------------------------------------------------------------- Problem List Details Patient Name: UDELL, HASPEL. Date of Service: 10/08/2015 1:30 PM Medical Record Patient Account Number: [id number] Number: Afful, RN, BSN, Treating RN: 1936-05-18 (80 y.o. [name] Date of Birth/Sex: Female) Other Clinician: Primary Care Physician: Halina Maidens Treating [name] Referring Physician: Halina Maidens Physician/Extender: Suella Grove in Treatment: 11 Active Problems ICD-10 Encounter Code Description Active Date Diagnosis E11.621 Type 2 diabetes mellitus with foot ulcer 07/23/2015 Yes L89.623 Pressure ulcer of left heel, stage 3 07/23/2015 Yes Z79.01 Long term (current) use of anticoagulants 07/23/2015 Yes Inactive Problems Resolved Problems Electronic Signature(s) Signed: 10/08/2015 1:47:16 PM By: [name], FACS Entered By: [name] on 10/08/2015 13:47:16 [name] ([id number]) -------------------------------------------------------------------------------- Progress Note Details Patient Name: [name] Date of Service: 10/08/2015 1:30 PM Medical Record Patient Account Number: [id number] Number: Afful, RN, BSN, Treating RN: 11-Nov-1935 (80 y.o. [name] Date of Birth/Sex: Female) Other Clinician: Primary Care Physician: Halina Maidens Treating [name] Referring Physician: Halina Maidens Physician/Extender: Suella Grove in Treatment: 11 Subjective Chief Complaint Information obtained from Patient Patients  presents for treatment of an open diabetic ulcer which is also sure related and has been there for about a month History of Present Illness (HPI) The following HPI elements were documented for the patient's wound: Location: left posterior heel Quality: Patient reports experiencing a sharp pain to affected area(s). Severity: Patient states wound (s) are getting better. Duration: Patient has had the wound for < 4 weeks prior to presenting for treatment Timing: Pain in wound is Intermittent (comes and goes Context: The wound appeared gradually over time Modifying Factors: Consults to this date include:was seen by her PCP and put on some antibiotics a while ago Associated Signs and Symptoms: Patient reports having difficulty standing for long periods. 80 year old patient referred to [place] by her PCP Dr. Halina Maidens for a pressure ulcer to the left heel which she's  had for about a month. She has a past medical history of COPD, diabetes mellitus type 2 with chronic kidney disease, multi-infarct dementia, anxiety, urinary retention, paroxysmal atrial fibrillation. She has also had moderate malnutrition, multi-infarct dementia, congestive heart failure, left displaced femoral neck fracture, systemic lupus erythematosus. he is also status total abdominal hysterectomy with bilateral salpingo-oophorectomy, EGDs, hip arthroplasty on the left, cataract surgery. She has been a from a smoker and quit in May 2013 and she smoked for about 40 years. recently admitted to [facility] between January 19 and 07/03/2015. x-ray of the left foot done on 07/02/2015 showed no acute abnormality. the dry ulcer on the left heel was treated with Keflex and in the past but there was no specific treatment given and there was no drainage. 08/02/2015 -- patient says this heel is extremely tender and last week after her debridement was done she had a lot of tenderness and compares it to worse than  childbirth and kidney stones. 08/23/2015 -- the patient has not been seen here for about 3 weeks but is in a much better state of mind today and seems to be having less pain. He has a caregiver today who says the patient was down with significant upper respiratory infection. AUDELIA, LESSO ([id number]) Objective Constitutional Pulse regular. Respirations normal and unlabored. Afebrile. Vitals Time Taken: 1:27 PM, Height: 64 in, Weight: 124 lbs, BMI: 21.3, Temperature: 97.7 F, Pulse: 73 bpm, Respiratory Rate: 17 breaths/min, Blood Pressure: 118/60 mmHg. Eyes Nonicteric. Reactive to light. Ears, Nose, Mouth, and Throat Lips, teeth, and gums WNL.Marland Kitchen Moist mucosa without lesions. Neck supple and nontender. No palpable supraclavicular or cervical adenopathy. Normal sized without goiter. Respiratory WNL. No retractions.. Breath sounds WNL, No rubs, rales, rhonchi, or wheeze.. Cardiovascular Heart rhythm and rate regular, no murmur or gallop.. Pedal Pulses WNL. No clubbing, cyanosis or edema. Lymphatic No adneopathy. No adenopathy. No adenopathy. Musculoskeletal Adexa without tenderness or enlargement.. Digits and nails w/o clubbing, cyanosis, infection, petechiae, ischemia, or inflammatory conditions.Marland Kitchen Psychiatric Judgement and insight Intact.. No evidence of depression, anxiety, or agitation.. General Notes: on the left heel she has a supple scar and there is no evidence of any open wound or surrounding cellulitis. This is considered healed Integumentary (Hair, Skin) No suspicious lesions. No crepitus or fluctuance. No peri-wound warmth or erythema. No masses.. Wound #1 status is Healed - Epithelialized. Original cause of wound was Pressure Injury. The wound is located on the Left Calcaneus. The wound measures 0cm length x 0cm width x 0cm depth; 0cm^2 area and 0cm^3 volume. The wound is limited to skin breakdown. There is no tunneling or undermining noted. There ROSSELIN, GUERRIERO  ([id number]) is a none present amount of drainage noted. The wound margin is flat and intact. There is no granulation within the wound bed. There is no necrotic tissue within the wound bed. The periwound skin appearance exhibited: Callus, Dry/Scaly. The periwound skin appearance did not exhibit: Crepitus, Excoriation, Fluctuance, Friable, Induration, Localized Edema, Rash, Scarring, Maceration, Moist, Atrophie Blanche, Cyanosis, Ecchymosis, Hemosiderin Staining, Mottled, Pallor, Rubor, Erythema. The periwound has tenderness on palpation. Assessment Active Problems ICD-10 E11.621 - Type 2 diabetes mellitus with foot ulcer L89.623 - Pressure ulcer of left heel, stage 3 Z79.01 - Long term (current) use of anticoagulants Having reviewed her left heel I am recommending a protective foam heel cup to be applied for a few more weeks to make sure that this supple scar remains completely healed. She is otherwise discharged from the wound  care services and will be seen back as needed. Plan Discharge From [facility] Services: Discharge from [place] - treatment Completed. patient understands to come back as needed for wound care needs. Having reviewed her left heel I am recommending a protective foam heel cup to be applied for a few more weeks to make sure that this supple scar remains completely healed. She is otherwise discharged from the wound care services and will be seen back as needed. Electronic Signature(s) Signed: 10/08/2015 1:48:44 PM By: [name], FACS Entered By: [name] on 10/08/2015 13:48:44 [name] ([name] ([id number]) -------------------------------------------------------------------------------- SuperBill Details Patient Name: [name] Date of Service: 10/08/2015 Medical Record Patient Account Number: [id number] Number: Afful, RN, BSN, Treating RN: [birth year] (80 y.o. [name] Date of Birth/Sex: Female) Other  Clinician: Primary Care Physician: Halina Maidens Treating [name] Referring Physician: Halina Maidens Physician/Extender: Suella Grove in Treatment: 11 Diagnosis Coding ICD-10 Codes Code Description E11.621 Type 2 diabetes mellitus with foot ulcer L89.623 Pressure ulcer of left heel, stage 3 Z79.01 Long term (current) use of anticoagulants Facility Procedures CPT4 Code: [id number] Description: [id number] - WOUND CARE VISIT-LEV 2 EST PT Modifier: Quantity: 1 Physician Procedures CPT4 Code: [id number] Description: [id number] - WC PHYS LEVEL 2 - EST PT ICD-10 Description Diagnosis E11.621 Type 2 diabetes mellitus with foot ulcer L89.623 Pressure ulcer of left heel, stage 3 Z79.01 Long term (current) use of anticoagulants Modifier: Quantity: 1 Electronic Signature(s) Signed: 10/08/2015 1:49:05 PM By: [name], FACS Entered By: [name] on 10/08/2015 13:49:04

## 2015-10-15 ENCOUNTER — Ambulatory Visit: Payer: Medicare Other | Admitting: Surgery

## 2015-10-21 DIAGNOSIS — R339 Retention of urine, unspecified: Secondary | ICD-10-CM | POA: Diagnosis not present

## 2015-10-21 DIAGNOSIS — J449 Chronic obstructive pulmonary disease, unspecified: Secondary | ICD-10-CM | POA: Diagnosis not present

## 2015-10-21 DIAGNOSIS — L89622 Pressure ulcer of left heel, stage 2: Secondary | ICD-10-CM | POA: Diagnosis not present

## 2015-10-21 DIAGNOSIS — F0390 Unspecified dementia without behavioral disturbance: Secondary | ICD-10-CM | POA: Diagnosis not present

## 2015-10-21 DIAGNOSIS — I5032 Chronic diastolic (congestive) heart failure: Secondary | ICD-10-CM | POA: Diagnosis not present

## 2015-10-21 DIAGNOSIS — E1140 Type 2 diabetes mellitus with diabetic neuropathy, unspecified: Secondary | ICD-10-CM | POA: Diagnosis not present

## 2015-10-27 ENCOUNTER — Encounter: Payer: Self-pay | Admitting: Internal Medicine

## 2015-10-27 DIAGNOSIS — J9611 Chronic respiratory failure with hypoxia: Secondary | ICD-10-CM | POA: Insufficient documentation

## 2015-10-28 ENCOUNTER — Telehealth: Payer: Self-pay

## 2015-10-28 DIAGNOSIS — F0150 Vascular dementia without behavioral disturbance: Secondary | ICD-10-CM | POA: Diagnosis not present

## 2015-10-28 DIAGNOSIS — G309 Alzheimer's disease, unspecified: Secondary | ICD-10-CM | POA: Diagnosis not present

## 2015-10-28 DIAGNOSIS — R296 Repeated falls: Secondary | ICD-10-CM | POA: Diagnosis not present

## 2015-10-28 DIAGNOSIS — R262 Difficulty in walking, not elsewhere classified: Secondary | ICD-10-CM | POA: Diagnosis not present

## 2015-10-28 DIAGNOSIS — F0280 Dementia in other diseases classified elsewhere without behavioral disturbance: Secondary | ICD-10-CM | POA: Diagnosis not present

## 2015-10-28 NOTE — Telephone Encounter (Signed)
-----   Message from Glean Hess, MD sent at 10/27/2015  7:56 AM EDT ----- Please call patient's husband to get recent blood sugar readings.  He was supposed to call last week and I did not hear from him.

## 2015-10-28 NOTE — Telephone Encounter (Signed)
Called and advised mother to have husband call us with readings. Marland Kitchen

## 2015-11-01 ENCOUNTER — Telehealth: Payer: Self-pay

## 2015-11-01 NOTE — Telephone Encounter (Signed)
-----   Message from Glean Hess, MD sent at 10/27/2015  7:56 AM EDT ----- Please call patient's husband to get recent blood sugar readings.  He was supposed to call last week and I did not hear from him.

## 2015-11-01 NOTE — Telephone Encounter (Signed)
Called twice and patient states husband outside. She gets confused and begins discussing Wii [employer]. I will call and ask her to go get husband next time.

## 2015-11-03 ENCOUNTER — Telehealth: Payer: Self-pay | Admitting: Internal Medicine

## 2015-11-03 NOTE — Telephone Encounter (Signed)
Husband could not return phone calls so came to the office with her meter for review.  On review, her blood sugars are averaging about 50 points lower.  She is advised to continue current medication.

## 2015-11-04 ENCOUNTER — Emergency Department: Payer: Medicare Other

## 2015-11-04 ENCOUNTER — Encounter: Payer: Self-pay | Admitting: *Deleted

## 2015-11-04 ENCOUNTER — Inpatient Hospital Stay
Admission: EM | Admit: 2015-11-04 | Discharge: 2015-11-10 | DRG: 469 | Disposition: A | Discharge status: Skilled Nursing Facility | Payer: Medicare Other | Attending: Internal Medicine | Admitting: Internal Medicine

## 2015-11-04 DIAGNOSIS — I7300 Raynaud's syndrome without gangrene: Secondary | ICD-10-CM | POA: Diagnosis present

## 2015-11-04 DIAGNOSIS — Z885 Allergy status to narcotic agent status: Secondary | ICD-10-CM | POA: Diagnosis not present

## 2015-11-04 DIAGNOSIS — R1312 Dysphagia, oropharyngeal phase: Secondary | ICD-10-CM | POA: Diagnosis not present

## 2015-11-04 DIAGNOSIS — Z7401 Bed confinement status: Secondary | ICD-10-CM | POA: Diagnosis not present

## 2015-11-04 DIAGNOSIS — J449 Chronic obstructive pulmonary disease, unspecified: Secondary | ICD-10-CM | POA: Diagnosis not present

## 2015-11-04 DIAGNOSIS — M6281 Muscle weakness (generalized): Secondary | ICD-10-CM | POA: Diagnosis not present

## 2015-11-04 DIAGNOSIS — S72001A Fracture of unspecified part of neck of right femur, initial encounter for closed fracture: Secondary | ICD-10-CM | POA: Diagnosis present

## 2015-11-04 DIAGNOSIS — Z8711 Personal history of peptic ulcer disease: Secondary | ICD-10-CM

## 2015-11-04 DIAGNOSIS — R296 Repeated falls: Secondary | ICD-10-CM | POA: Diagnosis present

## 2015-11-04 DIAGNOSIS — K219 Gastro-esophageal reflux disease without esophagitis: Secondary | ICD-10-CM | POA: Diagnosis present

## 2015-11-04 DIAGNOSIS — Z96641 Presence of right artificial hip joint: Secondary | ICD-10-CM | POA: Diagnosis not present

## 2015-11-04 DIAGNOSIS — Z741 Need for assistance with personal care: Secondary | ICD-10-CM | POA: Diagnosis not present

## 2015-11-04 DIAGNOSIS — F419 Anxiety disorder, unspecified: Secondary | ICD-10-CM | POA: Diagnosis present

## 2015-11-04 DIAGNOSIS — S72001D Fracture of unspecified part of neck of right femur, subsequent encounter for closed fracture with routine healing: Secondary | ICD-10-CM | POA: Diagnosis not present

## 2015-11-04 DIAGNOSIS — Z882 Allergy status to sulfonamides status: Secondary | ICD-10-CM | POA: Diagnosis not present

## 2015-11-04 DIAGNOSIS — J9691 Respiratory failure, unspecified with hypoxia: Secondary | ICD-10-CM | POA: Diagnosis not present

## 2015-11-04 DIAGNOSIS — M1990 Unspecified osteoarthritis, unspecified site: Secondary | ICD-10-CM | POA: Diagnosis present

## 2015-11-04 DIAGNOSIS — Z7901 Long term (current) use of anticoagulants: Secondary | ICD-10-CM | POA: Diagnosis not present

## 2015-11-04 DIAGNOSIS — Z87891 Personal history of nicotine dependence: Secondary | ICD-10-CM

## 2015-11-04 DIAGNOSIS — Z7984 Long term (current) use of oral hypoglycemic drugs: Secondary | ICD-10-CM | POA: Diagnosis not present

## 2015-11-04 DIAGNOSIS — Z9181 History of falling: Secondary | ICD-10-CM | POA: Diagnosis not present

## 2015-11-04 DIAGNOSIS — R1030 Lower abdominal pain, unspecified: Secondary | ICD-10-CM | POA: Diagnosis not present

## 2015-11-04 DIAGNOSIS — S299XXA Unspecified injury of thorax, initial encounter: Secondary | ICD-10-CM | POA: Diagnosis not present

## 2015-11-04 DIAGNOSIS — I10 Essential (primary) hypertension: Secondary | ICD-10-CM | POA: Diagnosis present

## 2015-11-04 DIAGNOSIS — R4182 Altered mental status, unspecified: Secondary | ICD-10-CM | POA: Diagnosis not present

## 2015-11-04 DIAGNOSIS — L97419 Non-pressure chronic ulcer of right heel and midfoot with unspecified severity: Secondary | ICD-10-CM | POA: Diagnosis present

## 2015-11-04 DIAGNOSIS — N390 Urinary tract infection, site not specified: Secondary | ICD-10-CM | POA: Diagnosis present

## 2015-11-04 DIAGNOSIS — F0390 Unspecified dementia without behavioral disturbance: Secondary | ICD-10-CM | POA: Diagnosis present

## 2015-11-04 DIAGNOSIS — Z9229 Personal history of other drug therapy: Secondary | ICD-10-CM

## 2015-11-04 DIAGNOSIS — Z91040 Latex allergy status: Secondary | ICD-10-CM | POA: Diagnosis not present

## 2015-11-04 DIAGNOSIS — I341 Nonrheumatic mitral (valve) prolapse: Secondary | ICD-10-CM | POA: Diagnosis present

## 2015-11-04 DIAGNOSIS — Z880 Allergy status to penicillin: Secondary | ICD-10-CM | POA: Diagnosis not present

## 2015-11-04 DIAGNOSIS — W010XXA Fall on same level from slipping, tripping and stumbling without subsequent striking against object, initial encounter: Secondary | ICD-10-CM | POA: Diagnosis present

## 2015-11-04 DIAGNOSIS — I4891 Unspecified atrial fibrillation: Secondary | ICD-10-CM | POA: Diagnosis not present

## 2015-11-04 DIAGNOSIS — S72009A Fracture of unspecified part of neck of unspecified femur, initial encounter for closed fracture: Secondary | ICD-10-CM | POA: Diagnosis not present

## 2015-11-04 DIAGNOSIS — E875 Hyperkalemia: Secondary | ICD-10-CM | POA: Diagnosis present

## 2015-11-04 DIAGNOSIS — R0902 Hypoxemia: Secondary | ICD-10-CM

## 2015-11-04 DIAGNOSIS — J441 Chronic obstructive pulmonary disease with (acute) exacerbation: Secondary | ICD-10-CM | POA: Diagnosis not present

## 2015-11-04 DIAGNOSIS — R269 Unspecified abnormalities of gait and mobility: Secondary | ICD-10-CM | POA: Diagnosis not present

## 2015-11-04 DIAGNOSIS — I480 Paroxysmal atrial fibrillation: Secondary | ICD-10-CM | POA: Diagnosis present

## 2015-11-04 DIAGNOSIS — M84459A Pathological fracture, hip, unspecified, initial encounter for fracture: Secondary | ICD-10-CM | POA: Diagnosis not present

## 2015-11-04 DIAGNOSIS — M25551 Pain in right hip: Secondary | ICD-10-CM | POA: Diagnosis not present

## 2015-11-04 DIAGNOSIS — Y92003 Bedroom of unspecified non-institutional (private) residence as the place of occurrence of the external cause: Secondary | ICD-10-CM | POA: Diagnosis not present

## 2015-11-04 DIAGNOSIS — R262 Difficulty in walking, not elsewhere classified: Secondary | ICD-10-CM | POA: Diagnosis not present

## 2015-11-04 DIAGNOSIS — J9611 Chronic respiratory failure with hypoxia: Secondary | ICD-10-CM | POA: Diagnosis present

## 2015-11-04 DIAGNOSIS — S72091A Other fracture of head and neck of right femur, initial encounter for closed fracture: Secondary | ICD-10-CM | POA: Diagnosis not present

## 2015-11-04 DIAGNOSIS — Z471 Aftercare following joint replacement surgery: Secondary | ICD-10-CM | POA: Diagnosis not present

## 2015-11-04 DIAGNOSIS — R41841 Cognitive communication deficit: Secondary | ICD-10-CM | POA: Diagnosis not present

## 2015-11-04 DIAGNOSIS — R001 Bradycardia, unspecified: Secondary | ICD-10-CM | POA: Diagnosis not present

## 2015-11-04 DIAGNOSIS — S72031A Displaced midcervical fracture of right femur, initial encounter for closed fracture: Secondary | ICD-10-CM | POA: Diagnosis not present

## 2015-11-04 DIAGNOSIS — E11621 Type 2 diabetes mellitus with foot ulcer: Secondary | ICD-10-CM | POA: Diagnosis present

## 2015-11-04 DIAGNOSIS — G8918 Other acute postprocedural pain: Secondary | ICD-10-CM | POA: Diagnosis not present

## 2015-11-04 DIAGNOSIS — E039 Hypothyroidism, unspecified: Secondary | ICD-10-CM | POA: Diagnosis not present

## 2015-11-04 DIAGNOSIS — E119 Type 2 diabetes mellitus without complications: Secondary | ICD-10-CM | POA: Diagnosis not present

## 2015-11-04 DIAGNOSIS — M329 Systemic lupus erythematosus, unspecified: Secondary | ICD-10-CM | POA: Diagnosis present

## 2015-11-04 DIAGNOSIS — Z7951 Long term (current) use of inhaled steroids: Secondary | ICD-10-CM

## 2015-11-04 DIAGNOSIS — R292 Abnormal reflex: Secondary | ICD-10-CM | POA: Diagnosis not present

## 2015-11-04 HISTORY — DX: Type 2 diabetes mellitus without complications: E11.9

## 2015-11-04 LAB — CBC WITH DIFFERENTIAL/PLATELET
BASOS ABS: 0.1 10*3/uL (ref 0–0.1)
BASOS ABS: 0 10*3/uL (ref 0–0.1)
Eosinophils Absolute: 0 10*3/uL (ref 0–0.7)
Eosinophils Absolute: 0 10*3/uL (ref 0–0.7)
Eosinophils Relative: 1 %
Eosinophils Relative: 0 %
HEMATOCRIT: 38 % (ref 35.0–47.0)
HEMATOCRIT: 37.1 % (ref 35.0–47.0)
HEMOGLOBIN: 12.5 g/dL (ref 12.0–16.0)
HEMOGLOBIN: 11.9 g/dL — AB (ref 12.0–16.0)
Lymphocytes Relative: 5 %
Lymphs Abs: 1 10*3/uL (ref 1.0–3.6)
Lymphs Abs: 0.7 10*3/uL — ABNORMAL LOW (ref 1.0–3.6)
MCH: 28.6 pg (ref 26.0–34.0)
MCH: 28.3 pg (ref 26.0–34.0)
MCHC: 32.8 g/dL (ref 32.0–36.0)
MCHC: 32 g/dL (ref 32.0–36.0)
MCV: 87.1 fL (ref 80.0–100.0)
MCV: 88.3 fL (ref 80.0–100.0)
Monocytes Absolute: 0.7 10*3/uL (ref 0.2–0.9)
Monocytes Absolute: 1 10*3/uL — ABNORMAL HIGH (ref 0.2–0.9)
Monocytes Relative: 7 %
NEUTROS ABS: 6.1 10*3/uL (ref 1.4–6.5)
NEUTROS ABS: 11.9 10*3/uL — AB (ref 1.4–6.5)
Neutrophils Relative %: 88 %
Platelets: 289 10*3/uL (ref 150–440)
Platelets: 229 10*3/uL (ref 150–440)
RBC: 4.37 MIL/uL (ref 3.80–5.20)
RBC: 4.21 MIL/uL (ref 3.80–5.20)
RDW: 15.2 % — ABNORMAL HIGH (ref 11.5–14.5)
RDW: 15.1 % — ABNORMAL HIGH (ref 11.5–14.5)
WBC: 7.9 10*3/uL (ref 3.6–11.0)
WBC: 13.7 10*3/uL — AB (ref 3.6–11.0)

## 2015-11-04 LAB — ALBUMIN: Albumin: 3.6 g/dL (ref 3.5–5.0)

## 2015-11-04 LAB — PROTIME-INR
INR: 2.08
Prothrombin Time: 23.2 seconds — ABNORMAL HIGH (ref 11.4–15.0)

## 2015-11-04 LAB — MAGNESIUM: Magnesium: 1.8 mg/dL (ref 1.7–2.4)

## 2015-11-04 LAB — TROPONIN I

## 2015-11-04 LAB — COMPREHENSIVE METABOLIC PANEL
ALBUMIN: 4 g/dL (ref 3.5–5.0)
ALK PHOS: 102 U/L (ref 38–126)
ALT: 22 U/L (ref 14–54)
AST: 29 U/L (ref 15–41)
Anion gap: 7 (ref 5–15)
BILIRUBIN TOTAL: 0.5 mg/dL (ref 0.3–1.2)
BUN: 21 mg/dL — AB (ref 6–20)
CALCIUM: 9.7 mg/dL (ref 8.9–10.3)
CO2: 31 mmol/L (ref 22–32)
CREATININE: 0.86 mg/dL (ref 0.44–1.00)
Chloride: 100 mmol/L — ABNORMAL LOW (ref 101–111)
GFR calc Af Amer: >60 mL/min (ref >60)
GFR calc non Af Amer: >60 mL/min (ref >60)
GLUCOSE: 191 mg/dL — AB (ref 65–99)
Potassium: 5 mmol/L (ref 3.5–5.1)
Sodium: 138 mmol/L (ref 135–145)
TOTAL PROTEIN: 8.2 g/dL — AB (ref 6.5–8.1)

## 2015-11-04 LAB — GLUCOSE, CAPILLARY
Glucose-Capillary: 105 mg/dL — ABNORMAL HIGH (ref 65–99)
Glucose-Capillary: 218 mg/dL — ABNORMAL HIGH (ref 65–99)

## 2015-11-04 LAB — APTT: aPTT: 43 seconds — ABNORMAL HIGH (ref 24–36)

## 2015-11-04 LAB — TYPE AND SCREEN
ABO/RH(D): O POS
Antibody Screen: NEGATIVE

## 2015-11-04 MED ORDER — METOPROLOL TARTRATE 25 MG PO TABS
25.0000 mg | ORAL_TABLET | Freq: Two times a day (BID) | ORAL | Status: DC
  Administered 2015-11-04 – 2015-11-06 (×4): 25 mg via ORAL
  Filled 2015-11-04 (×4): qty 1

## 2015-11-04 MED ORDER — OXYCODONE HCL 5 MG PO TABS
10.0000 mg | ORAL_TABLET | ORAL | Status: DC | PRN
  Administered 2015-11-05: 10 mg via ORAL
  Filled 2015-11-04: qty 2

## 2015-11-04 MED ORDER — POLYETHYLENE GLYCOL 3350 17 G PO PACK
17.0000 g | PACK | Freq: Every day | ORAL | Status: DC | PRN
Start: 2015-11-04 — End: 2015-11-10
  Administered 2015-11-07: 17 g via ORAL
  Filled 2015-11-04: qty 1

## 2015-11-04 MED ORDER — TIOTROPIUM BROMIDE MONOHYDRATE 18 MCG IN CAPS
18.0000 ug | ORAL_CAPSULE | Freq: Every day | RESPIRATORY_TRACT | Status: DC
  Administered 2015-11-05 – 2015-11-10 (×4): 18 ug via RESPIRATORY_TRACT
  Filled 2015-11-04: qty 5

## 2015-11-04 MED ORDER — HYDRALAZINE HCL 20 MG/ML IJ SOLN
10.0000 mg | Freq: Four times a day (QID) | INTRAMUSCULAR | Status: DC | PRN
  Administered 2015-11-04: 10 mg via INTRAVENOUS
  Filled 2015-11-04: qty 1

## 2015-11-04 MED ORDER — RISAQUAD PO CAPS
1.0000 | ORAL_CAPSULE | Freq: Every day | ORAL | Status: DC
  Administered 2015-11-05 – 2015-11-10 (×3): 1 via ORAL
  Filled 2015-11-04 (×4): qty 1

## 2015-11-04 MED ORDER — SODIUM CHLORIDE 0.9 % IV SOLN
INTRAVENOUS | Status: DC
  Administered 2015-11-04 – 2015-11-05 (×3): via INTRAVENOUS

## 2015-11-04 MED ORDER — DOCUSATE SODIUM 100 MG PO CAPS
100.0000 mg | ORAL_CAPSULE | Freq: Two times a day (BID) | ORAL | Status: DC
  Administered 2015-11-04 – 2015-11-05 (×3): 100 mg via ORAL
  Filled 2015-11-04 (×3): qty 1

## 2015-11-04 MED ORDER — METFORMIN HCL ER 500 MG PO TB24
500.0000 mg | ORAL_TABLET | Freq: Every day | ORAL | Status: DC
  Administered 2015-11-05: 500 mg via ORAL
  Filled 2015-11-04: qty 1

## 2015-11-04 MED ORDER — LINAGLIPTIN 5 MG PO TABS
5.0000 mg | ORAL_TABLET | Freq: Every day | ORAL | Status: DC
  Administered 2015-11-05: 5 mg via ORAL
  Filled 2015-11-04: qty 1

## 2015-11-04 MED ORDER — ACETAMINOPHEN 325 MG PO TABS
650.0000 mg | ORAL_TABLET | Freq: Four times a day (QID) | ORAL | Status: DC | PRN
  Administered 2015-11-05: 650 mg via ORAL
  Filled 2015-11-04: qty 2

## 2015-11-04 MED ORDER — HYDROMORPHONE HCL 1 MG/ML IJ SOLN
0.5000 mg | INTRAMUSCULAR | Status: DC | PRN
  Administered 2015-11-06: 0.5 mg via INTRAVENOUS
  Filled 2015-11-04: qty 1

## 2015-11-04 MED ORDER — ALIGN 4 MG PO CAPS: 1.0000 | ORAL_CAPSULE | Freq: Every day | ORAL | Status: DC

## 2015-11-04 MED ORDER — INSULIN ASPART 100 UNIT/ML ~~LOC~~ SOLN
0.0000 [IU] | Freq: Three times a day (TID) | SUBCUTANEOUS | Status: DC
  Administered 2015-11-05: 2 [IU] via SUBCUTANEOUS
  Administered 2015-11-05: 3 [IU] via SUBCUTANEOUS
  Administered 2015-11-05: 2 [IU] via SUBCUTANEOUS
  Filled 2015-11-04 (×2): qty 3
  Filled 2015-11-04 (×3): qty 2

## 2015-11-04 MED ORDER — ONDANSETRON HCL 4 MG PO TABS: 4.0000 mg | ORAL_TABLET | Freq: Four times a day (QID) | ORAL | Status: DC | PRN

## 2015-11-04 MED ORDER — MOMETASONE FURO-FORMOTEROL FUM 100-5 MCG/ACT IN AERO
2.0000 | INHALATION_SPRAY | Freq: Two times a day (BID) | RESPIRATORY_TRACT | Status: DC
  Administered 2015-11-04 – 2015-11-10 (×8): 2 via RESPIRATORY_TRACT
  Filled 2015-11-04: qty 8.8

## 2015-11-04 MED ORDER — HYDROCODONE-ACETAMINOPHEN 5-325 MG PO TABS
1.0000 | ORAL_TABLET | ORAL | Status: DC | PRN
Start: 2015-11-04 — End: 2015-11-06
  Administered 2015-11-05 (×2): 1 via ORAL
  Filled 2015-11-04 (×3): qty 1

## 2015-11-04 MED ORDER — MORPHINE SULFATE (PF) 4 MG/ML IV SOLN: 4.0000 mg | Freq: Once | INTRAVENOUS | Status: DC

## 2015-11-04 MED ORDER — MORPHINE SULFATE (PF) 2 MG/ML IV SOLN
INTRAVENOUS | Status: AC
  Administered 2015-11-04: 2 mg via INTRAVENOUS
  Filled 2015-11-04: qty 1

## 2015-11-04 MED ORDER — AMIODARONE HCL 200 MG PO TABS
200.0000 mg | ORAL_TABLET | Freq: Every day | ORAL | Status: DC
  Administered 2015-11-05 – 2015-11-10 (×4): 200 mg via ORAL
  Filled 2015-11-04 (×5): qty 1

## 2015-11-04 MED ORDER — FAMOTIDINE 20 MG PO TABS
20.0000 mg | ORAL_TABLET | Freq: Every day | ORAL | Status: DC
  Administered 2015-11-05 – 2015-11-10 (×4): 20 mg via ORAL
  Filled 2015-11-04 (×5): qty 1

## 2015-11-04 MED ORDER — ONDANSETRON HCL 4 MG/2ML IJ SOLN
4.0000 mg | INTRAMUSCULAR | Status: AC
  Administered 2015-11-04: 4 mg via INTRAVENOUS
  Filled 2015-11-04: qty 2

## 2015-11-04 MED ORDER — ALBUTEROL SULFATE (2.5 MG/3ML) 0.083% IN NEBU: 2.5000 mg | INHALATION_SOLUTION | Freq: Four times a day (QID) | RESPIRATORY_TRACT | Status: DC | PRN

## 2015-11-04 MED ORDER — VANCOMYCIN HCL IN DEXTROSE 1-5 GM/200ML-% IV SOLN
1000.0000 mg | Freq: Once | INTRAVENOUS | Status: AC
  Administered 2015-11-06: 500 mg via INTRAVENOUS
  Administered 2015-11-06: 1000 mg via INTRAVENOUS
  Filled 2015-11-04: qty 200

## 2015-11-04 MED ORDER — CETYLPYRIDINIUM CHLORIDE 0.05 % MT LIQD
7.0000 mL | Freq: Two times a day (BID) | OROMUCOSAL | Status: DC
  Administered 2015-11-04 – 2015-11-07 (×5): 7 mL via OROMUCOSAL

## 2015-11-04 MED ORDER — ONDANSETRON HCL 4 MG/2ML IJ SOLN
4.0000 mg | Freq: Four times a day (QID) | INTRAMUSCULAR | Status: DC | PRN
  Administered 2015-11-04: 4 mg via INTRAVENOUS
  Filled 2015-11-04: qty 2

## 2015-11-04 MED ORDER — HYDROMORPHONE HCL 1 MG/ML IJ SOLN
2.0000 mg | Freq: Once | INTRAMUSCULAR | Status: AC
  Administered 2015-11-04: 2 mg via INTRAVENOUS
  Filled 2015-11-04: qty 2

## 2015-11-04 MED ORDER — MORPHINE SULFATE (PF) 2 MG/ML IV SOLN
2.0000 mg | INTRAVENOUS | Status: DC | PRN
  Administered 2015-11-04: 2 mg via INTRAVENOUS
  Filled 2015-11-04 (×2): qty 1

## 2015-11-04 MED ORDER — MORPHINE SULFATE (PF) 2 MG/ML IV SOLN
2.0000 mg | Freq: Once | INTRAVENOUS | Status: AC
  Administered 2015-11-04: 2 mg via INTRAVENOUS

## 2015-11-04 MED ORDER — INSULIN ASPART 100 UNIT/ML ~~LOC~~ SOLN
0.0000 [IU] | Freq: Every day | SUBCUTANEOUS | Status: DC
  Administered 2015-11-04: 2 [IU] via SUBCUTANEOUS
  Administered 2015-11-06: 3 [IU] via SUBCUTANEOUS
  Administered 2015-11-07 – 2015-11-09 (×2): 2 [IU] via SUBCUTANEOUS
  Filled 2015-11-04 (×2): qty 2
  Filled 2015-11-04: qty 3

## 2015-11-04 MED ORDER — ACETAMINOPHEN 650 MG RE SUPP: 650.0000 mg | Freq: Four times a day (QID) | RECTAL | Status: DC | PRN

## 2015-11-04 NOTE — Progress Notes (Signed)
Patient's BP 180/88, Hydralazine given per MD orders.

## 2015-11-04 NOTE — H&P (Signed)
[facility] [place] NAME: [name]    MR#:  [id number]  DATE OF BIRTH:  11-08-1935  DATE OF ADMISSION:  11/04/2015  PRIMARY CARE PHYSICIAN: Halina Maidens, MD   REQUESTING/REFERRING PHYSICIAN: Dr. Hinda Kehr  CHIEF COMPLAINT:   Chief Complaint  Patient presents with  . Fall  . Hip Pain    HISTORY OF PRESENT ILLNESS:  [name]  is a 80 y.o. female with a known history of Multiple medical problems including diabetes mellitus, COPD not on home oxygen, SLE, Raynaud, rheumatic fever, hypertension, peptic ulcer disease, paroxysmal atrial fibrillation on xarelto, recent admission for fall and left hip fracture 6 months ago admitted after a fall and right femoral neck fracture. Patient recently having memory issues, so most of the history obtained from her as well as her husband. She has been having multiple falls lately at home. She has even seen a neurologist for the same - falls due to balance problems and also worsening memory. She is awaiting a second opinion from [facility]. Today she was walking in her bedroom and her bedroom slippers slipped away and she tripped on the fur carpet and fell. Right hip pain since then- X rays revealing femoral neck fracture on right side. Ortho consulted. Patient denies being sick, no nausea, vomiting. No loss of consciousness, lightheadedness or dizziness today.    PAST MEDICAL HISTORY:   Past Medical History  Diagnosis Date  . DM (diabetes mellitus) ([place])   . Mitral valve prolapse     a. Not noted on 03/2015 Echo.  Marland Kitchen COPD (chronic obstructive pulmonary disease) ([place])   . Palpitations   . Osteoarthritis   . Systemic lupus erythematosus ([place])   . Lumbar spondylolysis   . Raynaud's disease   . Anxiety   . IBS (irritable bowel syndrome)   . Uveitis, anterior     ????  . Rheumatic fever   . Ankylosing spondylitis ([place])     ? how diagnosed  . Insomnia, unspecified   . Chronic  peptic ulcer, unspecified site, without mention of hemorrhage, perforation, or obstruction   . History of kidney stones   . Chronic kidney infection   . Asthma     as child  . Hypothyroidism     mass, U/S 6/21 - nodules  . Motion sickness     car - back seat  . GERD (gastroesophageal reflux disease)   . Wears dentures     full upper, partial lower  . Wears contact lenses   . COPD (chronic obstructive pulmonary disease) ([place])     a.Uses 2l prn - followed by Dr. Raul Del.  . Raynaud disease   . Headache   . Neuropathy ([place])   . Wheezing   . Chronic diastolic CHF (congestive heart failure) ([place])     a. 03/2015 Echo: EF 55-60%, Gr 1 DD.  Marland Kitchen PAF (paroxysmal atrial fibrillation) ([place])     a. 03/2015 in setting of hip Fx->Amio/Xarelo;  b. CHA2DS2VASc= 4.  . Left displaced femoral neck fracture ([place])     a. 03/2015 s/p L hemiarthroplasty.    PAST SURGICAL HISTORY:   Past Surgical History  Procedure Laterality Date  . Esophagogastroduodenoscopy  multiple  . Colonoscopy  multiple  . Total abdominal hysterectomy w/ bilateral salpingoophorectomy  1990  . Breast biopsy    . Biopsy thyroid    . Esophagogastroduodenoscopy (egd) with propofol N/A 12/11/2014    Procedure: ESOPHAGOGASTRODUODENOSCOPY (EGD) WITH PROPOFOL;  Surgeon:  [name], MD;  Location: [facility];  Service: Endoscopy;  Laterality: N/A;  cytology brushing  . Cataract extraction w/phaco Left 03/23/2015    Procedure: CATARACT EXTRACTION PHACO AND INTRAOCULAR LENS PLACEMENT (IOC);  Surgeon: Birder Robson, MD;  Location: [facility];  Service: Ophthalmology;  Laterality: Left;  [place] 00:51   . Hip arthroplasty Left 03/26/2015    Procedure: ARTHROPLASTY BIPOLAR HIP (HEMIARTHROPLASTY);  Surgeon: Earnestine Leys, MD;  Location: [facility];  Service: Orthopedics;  Laterality: Left;  . Cataract extraction w/phaco Right 05/25/2015    Procedure: CATARACT EXTRACTION PHACO AND INTRAOCULAR LENS PLACEMENT (IOC);  Surgeon: Birder Robson, MD;  Location: [facility];  Service: Ophthalmology;  Laterality: Right;  [place] 00:57     SOCIAL HISTORY:   Social History  Substance Use Topics  . Smoking status: Former Smoker -- 0.25 packs/day for 40 years    Types: Cigarettes    Quit date: 10/11/2011  . Smokeless tobacco: Never Used     Comment: Quit 3 years  . Alcohol Use: No    FAMILY HISTORY:   Family History  Problem Relation Age of Onset  . Kidney disease Brother   . Heart disease Mother   . Hypothyroidism Mother   . Diabetes Mother   . Heart attack Mother   . Hypertension Mother   . Heart disease Father   . Heart attack Father   . Breast cancer Sister   . Hypothyroidism Sister   . Diabetes Sister   . Ovarian cancer Sister   . Diabetes Brother   . Prostate cancer Brother   . Hypothyroidism Daughter     DRUG ALLERGIES:   Allergies  Allergen Reactions  . Codeine Nausea And Vomiting  . Penicillins Hives  . Sulfa Antibiotics   . Doxycycline Rash  . Erythromycin Rash  . Latex Rash    REVIEW OF SYSTEMS:   Review of Systems  Constitutional: Positive for malaise/fatigue. Negative for fever, chills and weight loss.  HENT: Negative for ear discharge, ear pain, hearing loss and nosebleeds.   Eyes: Negative for blurred vision, double vision and photophobia.  Respiratory: Negative for cough, hemoptysis, shortness of breath and wheezing.   Cardiovascular: Negative for chest pain, palpitations, orthopnea and leg swelling.  Gastrointestinal: Negative for heartburn, nausea, vomiting, abdominal pain, diarrhea, constipation and melena.  Genitourinary: Negative for dysuria and urgency.  Musculoskeletal: Positive for myalgias, joint pain and falls. Negative for back pain and neck pain.  Skin: Negative for rash.  Neurological: Negative for dizziness, tingling, sensory change, speech change, focal weakness and headaches.  Endo/Heme/Allergies: Does not bruise/bleed easily.  Psychiatric/Behavioral: Positive for  memory loss. Negative for depression.    MEDICATIONS AT HOME:   Prior to Admission medications   Medication Sig Start Date End Date Taking? Authorizing Provider  albuterol (PROVENTIL) (2.5 MG/3ML) 0.083% nebulizer solution Take 2.5 mg by nebulization every 6 (six) hours as needed for wheezing or shortness of breath. Reported on 07/09/2015    Historical Provider, MD  amiodarone (PACERONE) 200 MG tablet Take 1 tablet (200 mg total) by mouth daily. 06/22/15   Glean Hess, MD  [facility] SENNA 8.6 MG tablet TAKE 1 TABLET (8.6 MG TOTAL) BY MOUTH 2 (TWO) TIMES DAILY. 04/20/15   Historical Provider, MD  donepezil (ARICEPT) 5 MG tablet Take by mouth. Reported on 08/18/2015 07/29/15   Historical Provider, MD  JANUVIA 100 MG tablet Take 0.5 tablets (50 mg total) by mouth daily. 06/22/15   Glean Hess, MD  metFORMIN ([place]) 500  MG 24 hr tablet Take 1 tablet (500 mg total) by mouth daily before supper. 10/06/15   Glean Hess, MD  metoprolol tartrate (LOPRESSOR) 25 MG tablet Reported on 07/16/2015 06/01/15   Historical Provider, MD  mometasone-formoterol (DULERA) 100-5 MCG/ACT AERO Inhale 2 puffs into the lungs 2 (two) times daily. 07/16/15   Glean Hess, MD  Probiotic Product (ALIGN) 4 MG CAPS Take 1 capsule (4 mg total) by mouth daily. 06/22/15   Glean Hess, MD  ranitidine (ZANTAC) 150 MG tablet Take 1 tablet (150 mg total) by mouth 2 (two) times daily. 06/22/15   Glean Hess, MD  rivaroxaban (XARELTO) 20 MG TABS tablet Take 1 tablet (20 mg total) by mouth daily. 06/22/15   Glean Hess, MD  tiotropium (SPIRIVA) 18 MCG inhalation capsule Place 1 capsule (18 mcg total) into inhaler and inhale daily. 06/22/15   Glean Hess, MD  VENTOLIN HFA 108 [phone] Base) MCG/ACT inhaler INHALE 2 PUFFS INTO THE LUNGS 4 TIMES DAILY AFTER MEALS AND AT BEDTIME 09/06/15   Glean Hess, MD      VITAL SIGNS:  Blood pressure 182/78, pulse 58, temperature 97.8 F (36.6 C), temperature source Oral,  resp. rate 18, height 5\' 4"  (1.626 m), weight 51.71 kg (114 lb), SpO2 100 %.  PHYSICAL EXAMINATION:   Physical Exam  GENERAL:  80 y.o.-year-old patient lying in the bed with no acute distress.  EYES: Pupils equal, round, reactive to light and accommodation. No scleral icterus. Extraocular muscles intact.  HEENT: Head atraumatic, normocephalic. Oropharynx and nasopharynx clear.  NECK:  Supple, no jugular venous distention. No thyroid enlargement, no tenderness.  LUNGS: Normal breath sounds bilaterally, no wheezing, rales,rhonchi or crepitation. No use of accessory muscles of respiration. Decreased bibasilar breath sounds CARDIOVASCULAR: S1, S2 normal. No  rubs, or gallops. 2/6 systolic murmur is present ABDOMEN: Soft, nontender, nondistended. Bowel sounds present. No organomegaly or mass.  EXTREMITIES: No pedal edema, cyanosis, or clubbing.  Right lower extremity is abducted and externally rotated. NEUROLOGIC: Cranial nerves II through XII are intact. Muscle strength 5/5 in all extremities except right leg due to fracture. Sensation intact. Gait not checked.  PSYCHIATRIC: The patient is alert and oriented x 2. Some confusion noted. SKIN: No obvious rash, lesion, or ulcer.  LABORATORY PANEL:   CBC  Recent Labs Lab 11/04/15 1204  WBC 7.9  HGB 12.5  HCT 38.0  PLT 289   ------------------------------------------------------------------------------------------------------------------  Chemistries   Recent Labs Lab 11/04/15 1204  NA 138  K 5.0  CL 100*  CO2 31  GLUCOSE 191*  BUN 21*  CREATININE 0.86  CALCIUM 9.7  MG 1.8  AST 29  ALT 22  ALKPHOS 102  BILITOT 0.5   ------------------------------------------------------------------------------------------------------------------  Cardiac Enzymes  Recent Labs Lab 11/04/15 1204  TROPONINI <0.03    ------------------------------------------------------------------------------------------------------------------  RADIOLOGY:  Dg Chest Portable 1 View  11/04/2015  CLINICAL DATA:  [place] at home today, RIGHT hip pain, RIGHT hip fracture EXAM: PORTABLE CHEST 1 VIEW COMPARISON:  07/01/2015 FINDINGS: Normal heart size, mediastinal contours, and pulmonary vascularity. Atherosclerotic calcification aorta. Lungs clear. No pleural effusion or pneumothorax. Bones demineralized. IMPRESSION: No acute abnormalities. Electronically Signed   By: Lavonia Dana M.D.   On: 11/04/2015 13:13   Dg Hip Unilat  With Pelvis 2-3 Views Right  11/04/2015  CLINICAL DATA:  [place] at home today landing on RIGHT hip, RIGHT hip pain, external rotation and shortening noted, initially encounter EXAM: DG HIP (WITH OR WITHOUT PELVIS) 2-3V  RIGHT COMPARISON:  03/26/2015 FINDINGS: Diffuse osseous demineralization. LEFT hip prosthesis identified. New displaced RIGHT femoral neck fracture without dislocation. Pelvis intact. Degenerative changes at visualized lower lumbar spine. IMPRESSION: Displaced RIGHT femoral neck fracture. Electronically Signed   By: Lavonia Dana M.D.   On: 11/04/2015 13:12    EKG:   Orders placed or performed during the hospital encounter of 11/04/15  . EKG 12-Lead  . EKG 12-Lead  . ED EKG  . ED EKG    IMPRESSION AND PLAN:   [name]  is a 80 y.o. female with a known history of Multiple medical problems including diabetes mellitus, COPD not on home oxygen, SLE, Raynaud, rheumatic fever, hypertension, peptic ulcer disease, paroxysmal atrial fibrillation on xarelto, recent admission for fall and left hip fracture 6 months ago admitted after a fall and right femoral neck fracture.  #1 right femoral neck fracture- due to mechanical fall. Multiple falls history Ortho consulted, moderate risk for surgery- hold xarelto, wait 24hrs from last dose - cont beta blocker, EKG and CXR reviewed -pain control - post  op DVT prophylaxis and physical therapy recommended  #2 paroxysmal atrial fibrillation- on amiodarone. Now in NSR Due to her falls history- not a good candidate for anticoagulation- will need to check with her cardiologist For now hold xarelto as will need hip surgery Also on metoprolol  #3 hyperkalemia- not on potassium supplements or meds that can cause increase in potassium Monitor and recheck  #4 multiple falls and memory loss- seeing Dr. Melrose Nakayama, couldn't tolerate aricept.awaiting second opinion from [facility] Physical therapy  #5 diabetes mellitus- tradjenta and metformin, add ssi  #6 DVT prophylaxis- TEDs and SCDs, no heparin products as surgery will be scheduled   All the records are reviewed and case discussed with ED provider. Management plans discussed with the patient, family and they are in agreement.  CODE STATUS: Full Code  TOTAL TIME TAKING CARE OF THIS PATIENT: 50 minutes.    [name] M.D on 11/04/2015 at 2:15 PM  Between 7am to 6pm - Pager - [phone]  After 6pm go to [email or web address] - password [email or web address] [facility]  Office  [phone]  CC: Primary care physician; Halina Maidens, MD

## 2015-11-04 NOTE — ED Notes (Signed)
Pt given swabs to wet lips and mouth at this time.

## 2015-11-04 NOTE — Consult Note (Signed)
ORTHOPAEDIC CONSULTATION  REQUESTING PHYSICIAN: Gladstone Lighter, MD  Chief Complaint: Right hip status post fall  HPI: [name] is a 80 y.o. female who complains of  right hip pain status post fall at home. The mechanism of this fall is unclear. Patient is unable to stand or ambulate following this injury. She is brought to the [facility] emergency Department respiratory diagnosed with a displaced right femoral neck hip fracture. Patient has a prior left hip hemiarthroplasty. She denies any loss of consciousness or other injuries. She is seen in her hospital room with her husband at the bedside.  Past Medical History  Diagnosis Date  . DM (diabetes mellitus) ([place])   . Mitral valve prolapse     a. Not noted on 03/2015 Echo.  Marland Kitchen COPD (chronic obstructive pulmonary disease) ([place])   . Palpitations   . Osteoarthritis   . Systemic lupus erythematosus ([place])   . Lumbar spondylolysis   . Raynaud's disease   . Anxiety   . IBS (irritable bowel syndrome)   . Uveitis, anterior     ????  . Rheumatic fever   . Ankylosing spondylitis ([place])     ? how diagnosed  . Insomnia, unspecified   . Chronic peptic ulcer, unspecified site, without mention of hemorrhage, perforation, or obstruction   . History of kidney stones   . Chronic kidney infection   . Asthma     as child  . Hypothyroidism     mass, U/S 6/21 - nodules  . Motion sickness     car - back seat  . GERD (gastroesophageal reflux disease)   . Wears dentures     full upper, partial lower  . Wears contact lenses   . COPD (chronic obstructive pulmonary disease) ([place])     a.Uses 2l prn - followed by Dr. Raul Del.  . Raynaud disease   . Headache   . Neuropathy ([place])   . Wheezing   . Chronic diastolic CHF (congestive heart failure) ([place])     a. 03/2015 Echo: EF 55-60%, Gr 1 DD.  Marland Kitchen PAF (paroxysmal atrial fibrillation) ([place])     a. 03/2015 in setting of hip Fx->Amio/Xarelo;  b. CHA2DS2VASc= 4.  . Left displaced  femoral neck fracture ([place])     a. 03/2015 s/p L hemiarthroplasty.  . Diabetes mellitus [facility])    Past Surgical History  Procedure Laterality Date  . Esophagogastroduodenoscopy  multiple  . Colonoscopy  multiple  . Total abdominal hysterectomy w/ bilateral salpingoophorectomy  1990  . Breast biopsy    . Biopsy thyroid    . Esophagogastroduodenoscopy (egd) with propofol N/A 12/11/2014    Procedure: ESOPHAGOGASTRODUODENOSCOPY (EGD) WITH PROPOFOL;  Surgeon: [name], MD;  Location: [facility];  Service: Endoscopy;  Laterality: N/A;  cytology brushing  . Cataract extraction w/phaco Left 03/23/2015    Procedure: CATARACT EXTRACTION PHACO AND INTRAOCULAR LENS PLACEMENT (IOC);  Surgeon: Birder Robson, MD;  Location: [facility];  Service: Ophthalmology;  Laterality: Left;  [place] 00:51   . Hip arthroplasty Left 03/26/2015    Procedure: ARTHROPLASTY BIPOLAR HIP (HEMIARTHROPLASTY);  Surgeon: Earnestine Leys, MD;  Location: [facility];  Service: Orthopedics;  Laterality: Left;  . Cataract extraction w/phaco Right 05/25/2015    Procedure: CATARACT EXTRACTION PHACO AND INTRAOCULAR LENS PLACEMENT (IOC);  Surgeon: Birder Robson, MD;  Location: [facility];  Service: Ophthalmology;  Laterality: Right;  [place] 00:57   . Joint replacement Left    Social History   Social History  . Marital Status: Married  Spouse Name: N/A  . Number of Children: 4  . Years of Education: N/A   Occupational History  . retired    Social History Main Topics  . Smoking status: Former Smoker -- 0.25 packs/day for 40 years    Types: Cigarettes    Quit date: 10/11/2011  . Smokeless tobacco: Never Used     Comment: Quit 3 years  . Alcohol Use: No  . Drug Use: No  . Sexual Activity: Not Asked   Other Topics Concern  . None   Social History Narrative   Married (#2)   Moved from [place] in Summer 2014 - Lives in [place]   1 son, 3 daughters (one lives in this area)   2 caffeinated beverages daily   04/18/2013  update   Family History  Problem Relation Age of Onset  . Kidney disease Brother   . Heart disease Mother   . Hypothyroidism Mother   . Diabetes Mother   . Heart attack Mother   . Hypertension Mother   . Heart disease Father   . Heart attack Father   . Breast cancer Sister   . Hypothyroidism Sister   . Diabetes Sister   . Ovarian cancer Sister   . Diabetes Brother   . Prostate cancer Brother   . Hypothyroidism Daughter    Allergies  Allergen Reactions  . Codeine Nausea And Vomiting  . Penicillins Hives    Has patient had a PCN reaction causing immediate rash, facial/tongue/throat swelling, SOB or lightheadedness with hypotension: no  Has patient had a PCN reaction causing severe rash involving mucus membranes or skin necrosis: no  Has patient had a PCN reaction that required hospitalization: no  Has patient had a PCN reaction occurring within the last 10 years: no  If all of the above answers are "NO", then may proceed with Cephalosporin use.   . Sulfa Antibiotics   . Doxycycline Rash  . Erythromycin Rash  . Latex Rash   Prior to Admission medications   Medication Sig Start Date End Date Taking? Authorizing Provider  albuterol (PROVENTIL HFA;VENTOLIN HFA) 108 (90 Base) MCG/ACT inhaler Inhale 2 puffs into the lungs 4 (four) times daily as needed for wheezing or shortness of breath.   Yes Historical Provider, MD  albuterol (PROVENTIL) (2.5 MG/3ML) 0.083% nebulizer solution Take 2.5 mg by nebulization every 6 (six) hours as needed for wheezing or shortness of breath. Reported on 07/09/2015   Yes Historical Provider, MD  amiodarone (PACERONE) 200 MG tablet Take 1 tablet (200 mg total) by mouth daily. 06/22/15  Yes Glean Hess, MD  JANUVIA 100 MG tablet Take 0.5 tablets (50 mg total) by mouth daily. 06/22/15  Yes Glean Hess, MD  memantine (NAMENDA) 5 MG tablet Take 5 mg by mouth 2 (two) times daily.   Yes Historical Provider, MD  metFORMIN (GLUCOPHAGE-XR) 500 MG 24 hr  tablet Take 1 tablet (500 mg total) by mouth daily before supper. 10/06/15  Yes Glean Hess, MD  metoprolol tartrate (LOPRESSOR) 25 MG tablet Take 25 mg by mouth 2 (two) times daily.   Yes Historical Provider, MD  mometasone-formoterol (DULERA) 100-5 MCG/ACT AERO Inhale 2 puffs into the lungs 2 (two) times daily. 07/16/15  Yes Glean Hess, MD  Probiotic Product (ALIGN) 4 MG CAPS Take 1 capsule (4 mg total) by mouth daily. 06/22/15  Yes Glean Hess, MD  ranitidine (ZANTAC) 150 MG tablet Take 1 tablet (150 mg total) by mouth 2 (two) times  daily. 06/22/15  Yes Glean Hess, MD  rivaroxaban (XARELTO) 20 MG TABS tablet Take 1 tablet (20 mg total) by mouth daily. 06/22/15  Yes Glean Hess, MD  senna (SENOKOT) 8.6 MG TABS tablet Take 1 tablet by mouth 2 (two) times daily.   Yes Historical Provider, MD  tiotropium (SPIRIVA) 18 MCG inhalation capsule Place 1 capsule (18 mcg total) into inhaler and inhale daily. 06/22/15  Yes Glean Hess, MD   Dg Chest Portable 1 View  11/04/2015  CLINICAL DATA:  [place] at home today, RIGHT hip pain, RIGHT hip fracture EXAM: PORTABLE CHEST 1 VIEW COMPARISON:  07/01/2015 FINDINGS: Normal heart size, mediastinal contours, and pulmonary vascularity. Atherosclerotic calcification aorta. Lungs clear. No pleural effusion or pneumothorax. Bones demineralized. IMPRESSION: No acute abnormalities. Electronically Signed   By: Lavonia Dana M.D.   On: 11/04/2015 13:13   Dg Hip Unilat  With Pelvis 2-3 Views Right  11/04/2015  CLINICAL DATA:  [place] at home today landing on RIGHT hip, RIGHT hip pain, external rotation and shortening noted, initially encounter EXAM: DG HIP (WITH OR WITHOUT PELVIS) 2-3V RIGHT COMPARISON:  03/26/2015 FINDINGS: Diffuse osseous demineralization. LEFT hip prosthesis identified. New displaced RIGHT femoral neck fracture without dislocation. Pelvis intact. Degenerative changes at visualized lower lumbar spine. IMPRESSION: Displaced RIGHT femoral neck  fracture. Electronically Signed   By: Lavonia Dana M.D.   On: 11/04/2015 13:12    Positive ROS: All other systems have been reviewed and were otherwise negative with the exception of those mentioned in the HPI and as above.  Physical Exam: General: Very drowsy, but arousable   MUSCULOSKELETAL: Right lower extremity:  Skin is intact. Patient is no erythema or ecchymosis or significant swelling. Her thigh and leg compartments are soft and compressible. She is palpable pedal pulses, intact sensation light touch intact motor function throughout the right lower extremity.  Assessment: Right displaced femoral neck hip fracture  Plan: Patient is very drowsy. I explained to her husband that she has a displaced right femoral neck hip fracture. She will have to undergo the same operation she had undergone left hip fracture. I recommended a right hip hemiarthroplasty as definitive treatment for this fracture. Patient is aware the risks benefits of this procedure having been through it before. She takes Xarelto for atrial fibrillation. Her last dose was last night. Patient will need a minimum 48 hours off this medication before surgery to be performed. I am hopeful the patient can undergo surgery on Saturday morning.    [place]ton Park, MD    11/04/2015 5:52 PM

## 2015-11-04 NOTE — ED Notes (Signed)
Patient transported to X-ray 

## 2015-11-04 NOTE — ED Notes (Signed)
Pt to ED from home after mechanical fall today resulting in right hip pain. Pt landed on right hip, external rotation and shortening noted. Pedal pulses present and intact bilaterally. Pt with hx of Afib and diabetes, BGL 256 per EMS. Pt AAOx4 upon arrival, vitals stable, NAD.

## 2015-11-04 NOTE — ED Provider Notes (Signed)
[facility] Emergency Department Provider Note  ____________________________________________  Time seen: Approximately 12:06 PM  I have reviewed the triage vital signs and the nursing notes.   HISTORY  Chief Complaint Fall and Hip Pain    HPI [name] is a 80 y.o. female with a history of a left-sided hip fracture approximately 7 months ago repaired by Dr. Earnestine Leys presents by EMS today for evaluation of right hip pain, acute in onset and moderate in severity after a mechanical fall earlier this morning.  She reports that she was walking in her bedroom and "got tripped up" over her bedroom slippers, twisting her leg and falling to the ground.  Her husband found her about 30 minutes later and called EMS.  She was not able to move during that time because of the pain in her hip but she did not strike her head, did not lose consciousness, and has no pain in her head or her neck.  She did not sustain any other injuries and complains only of moderate sharp stabbing pain in her right hip that is made worse with any sort of movement including transfer from EMS stretcher to the ED bed.  She has no numbness or tingling in the affected limb and is able to wiggle her toes and move her foot.  She reports that she had atrial fibrillation during her last hospitalization and she takes Xarelto.    Past Medical History  Diagnosis Date  . DM (diabetes mellitus) ([place])   . Mitral valve prolapse     a. Not noted on 03/2015 Echo.  Marland Kitchen COPD (chronic obstructive pulmonary disease) ([place])   . Palpitations   . Osteoarthritis   . Systemic lupus erythematosus ([place])   . Lumbar spondylolysis   . Raynaud's disease   . Anxiety   . IBS (irritable bowel syndrome)   . Uveitis, anterior     ????  . Rheumatic fever   . Ankylosing spondylitis ([place])     ? how diagnosed  . Insomnia, unspecified   . Chronic peptic ulcer, unspecified site, without mention of hemorrhage,  perforation, or obstruction   . History of kidney stones   . Chronic kidney infection   . Asthma     as child  . Hypothyroidism     mass, U/S 6/21 - nodules  . Motion sickness     car - back seat  . GERD (gastroesophageal reflux disease)   . Wears dentures     full upper, partial lower  . Wears contact lenses   . COPD (chronic obstructive pulmonary disease) ([place])     a.Uses 2l prn - followed by Dr. Raul Del.  . Raynaud disease   . Headache   . Neuropathy ([place])   . Wheezing   . Chronic diastolic CHF (congestive heart failure) ([place])     a. 03/2015 Echo: EF 55-60%, Gr 1 DD.  Marland Kitchen PAF (paroxysmal atrial fibrillation) ([place])     a. 03/2015 in setting of hip Fx->Amio/Xarelo;  b. CHA2DS2VASc= 4.  . Left displaced femoral neck fracture ([place])     a. 03/2015 s/p L hemiarthroplasty.  . Diabetes mellitus [facility])     Patient Active Problem List   Diagnosis Date Noted  . Hip fracture ([place]) 11/04/2015  . Chronic respiratory failure with hypoxia ([place]) 10/27/2015  . Mixed Alzheimer's and vascular dementia 07/29/2015  . Difficulty in walking 07/29/2015  . Type 2 diabetes mellitus with stage 1 chronic kidney disease, without long-term current use of  insulin ([place]) 07/16/2015  . Tremor observed on examination 07/16/2015  . Urinary retention 07/12/2015  . Pressure ulcer of left heel 07/12/2015  . Malnutrition of moderate degree 07/03/2015  . Multi-infarct dementia 07/03/2015  . 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 Paroxysmal atrial fibrillation ([place]) 06/25/2015  . Chronic diastolic CHF (congestive heart failure) ([place])   . Left displaced femoral neck fracture ([place])   . Fall   . GERD (gastroesophageal reflux disease) 03/26/2015  . Anxiety 03/26/2015  . Gastroduodenal ulcer 01/05/2015  . Difficulty swallowing solids   . Esophageal candidiasis ([place])   . Acquired hypothyroidism 08/18/2014  . Systemic lupus erythematosus ([place]) 04/03/2014  . Severe chronic obstructive pulmonary disease ([place])  10/28/2013    Past Surgical History  Procedure Laterality Date  . Esophagogastroduodenoscopy  multiple  . Colonoscopy  multiple  . Total abdominal hysterectomy w/ bilateral salpingoophorectomy  1990  . Breast biopsy    . Biopsy thyroid    . Esophagogastroduodenoscopy (egd) with propofol N/A 12/11/2014    Procedure: ESOPHAGOGASTRODUODENOSCOPY (EGD) WITH PROPOFOL;  Surgeon: [name], MD;  Location: [facility];  Service: Endoscopy;  Laterality: N/A;  cytology brushing  . Cataract extraction w/phaco Left 03/23/2015    Procedure: CATARACT EXTRACTION PHACO AND INTRAOCULAR LENS PLACEMENT (IOC);  Surgeon: Birder Robson, MD;  Location: [facility];  Service: Ophthalmology;  Laterality: Left;  [place] 00:51   . Hip arthroplasty Left 03/26/2015    Procedure: ARTHROPLASTY BIPOLAR HIP (HEMIARTHROPLASTY);  Surgeon: Earnestine Leys, MD;  Location: [facility];  Service: Orthopedics;  Laterality: Left;  . Cataract extraction w/phaco Right 05/25/2015    Procedure: CATARACT EXTRACTION PHACO AND INTRAOCULAR LENS PLACEMENT (IOC);  Surgeon: Birder Robson, MD;  Location: [facility];  Service: Ophthalmology;  Laterality: Right;  [place] 00:57   . Joint replacement Left     No current outpatient prescriptions on file.  Allergies Codeine; Penicillins; Sulfa antibiotics; Doxycycline; Erythromycin; and Latex  Family History  Problem Relation Age of Onset  . Kidney disease Brother   . Heart disease Mother   . Hypothyroidism Mother   . Diabetes Mother   . Heart attack Mother   . Hypertension Mother   . Heart disease Father   . Heart attack Father   . Breast cancer Sister   . Hypothyroidism Sister   . Diabetes Sister   . Ovarian cancer Sister   . Diabetes Brother   . Prostate cancer Brother   . Hypothyroidism Daughter     Social History Social History  Substance Use Topics  . Smoking status: Former Smoker -- 0.25 packs/day for 40 years    Types: Cigarettes    Quit date: 10/11/2011  . Smokeless  tobacco: Never Used     Comment: Quit 3 years  . Alcohol Use: No    Review of Systems Constitutional: No fever/chills Eyes: No visual changes. ENT: No sore throat. Cardiovascular: Denies chest pain. Respiratory: Denies shortness of breath. Gastrointestinal: No abdominal pain.  No nausea, no vomiting.  No diarrhea.  No constipation. Genitourinary: Negative for dysuria. Musculoskeletal: Acute onset right-sided hip pain after mechanical fall Skin: Negative for rash. Neurological: Negative for headaches, focal weakness or numbness.  10-point ROS otherwise negative.  ____________________________________________   PHYSICAL EXAM:  VITAL SIGNS: ED Triage Vitals  Enc Vitals Group     BP 11/04/15 1204 182/78 mmHg     Pulse Rate 11/04/15 1204 58     Resp 11/04/15 1204 18     Temp 11/04/15 1204 97.8 F (36.6 C)  Temp Source 11/04/15 1204 Oral     SpO2 11/04/15 1204 100 %     Weight 11/04/15 1204 114 lb (51.71 kg)     Height 11/04/15 1204 5\' 4"  (1.626 m)     Head Cir --      Peak Flow --      Pain Score 11/04/15 1202 5     Pain Loc --      Pain Edu? --      Excl. in [place]? --     Constitutional: Alert and oriented. Well appearing and in no acute distress. Eyes: Conjunctivae are normal. PERRL. EOMI. Head: Atraumatic. Nose: No congestion/rhinnorhea. Mouth/Throat: Mucous membranes are moist.  Oropharynx non-erythematous. Neck: No stridor.  No meningeal signs.  No cervical spine tenderness to palpation. Cardiovascular: Normal rate, regular rhythm. Good peripheral circulation. Grossly normal heart sounds.   Respiratory: Normal respiratory effort.  No retractions. Lungs CTAB. Gastrointestinal: Soft and nontender. No distention.  Musculoskeletal: Right leg is externally rotated and shortened.  Neurovascularly intact to the foot.  Able to wiggle toes.  Does not appear to be in acute distress until she moves in the bed and then she winces in pain.  Tenderness with attempted gentle  range of motion of the hip.  Pelvis is stable and nontender. Neurologic:  Normal speech and language. No gross focal neurologic deficits are appreciated.  Skin:  Skin is warm, dry and intact. No rash noted. Psychiatric: Mood and affect are normal. Speech and behavior are normal.  ____________________________________________   LABS (all labs ordered are listed, but only abnormal results are displayed)  Labs Reviewed  CBC WITH DIFFERENTIAL/PLATELET - Abnormal; Notable for the following:    RDW 15.2 (*)    All other components within normal limits  COMPREHENSIVE METABOLIC PANEL - Abnormal; Notable for the following:    Chloride 100 (*)    Glucose, Bld 191 (*)    BUN 21 (*)    Total Protein 8.2 (*)    All other components within normal limits  PROTIME-INR - Abnormal; Notable for the following:    Prothrombin Time 23.2 (*)    All other components within normal limits  APTT - Abnormal; Notable for the following:    aPTT 43 (*)    All other components within normal limits  GLUCOSE, CAPILLARY - Abnormal; Notable for the following:    Glucose-Capillary 105 (*)    All other components within normal limits  CBC WITH DIFFERENTIAL/PLATELET - Abnormal; Notable for the following:    WBC 13.7 (*)    Hemoglobin 11.9 (*)    RDW 15.1 (*)    Neutro Abs 11.9 (*)    Lymphs Abs 0.7 (*)    Monocytes Absolute 1.0 (*)    All other components within normal limits  GLUCOSE, CAPILLARY - Abnormal; Notable for the following:    Glucose-Capillary 218 (*)    All other components within normal limits  URINE CULTURE  TROPONIN I  MAGNESIUM  ALBUMIN  CBC  BASIC METABOLIC PANEL  TYPE AND SCREEN   ____________________________________________  EKG  ED ECG REPORT I, Hildegard Hlavac, the attending physician, personally viewed and interpreted this ECG.  Date: 11/04/2015 EKG Time: 12:05 Rate: 57 Rhythm: Borderline bradycardia QRS Axis: normal Intervals: normal ST/T Wave abnormalities:  normal Conduction Disturbances: none Narrative Interpretation: unremarkable  ____________________________________________  RADIOLOGY   Dg Chest Portable 1 View  11/04/2015  CLINICAL DATA:  [place] at home today, RIGHT hip pain, RIGHT hip fracture EXAM: PORTABLE CHEST 1 VIEW COMPARISON:  07/01/2015 FINDINGS: Normal heart size, mediastinal contours, and pulmonary vascularity. Atherosclerotic calcification aorta. Lungs clear. No pleural effusion or pneumothorax. Bones demineralized. IMPRESSION: No acute abnormalities. Electronically Signed   By: Lavonia Dana M.D.   On: 11/04/2015 13:13   Dg Hip Unilat  With Pelvis 2-3 Views Right  11/04/2015  CLINICAL DATA:  [place] at home today landing on RIGHT hip, RIGHT hip pain, external rotation and shortening noted, initially encounter EXAM: DG HIP (WITH OR WITHOUT PELVIS) 2-3V RIGHT COMPARISON:  03/26/2015 FINDINGS: Diffuse osseous demineralization. LEFT hip prosthesis identified. New displaced RIGHT femoral neck fracture without dislocation. Pelvis intact. Degenerative changes at visualized lower lumbar spine. IMPRESSION: Displaced RIGHT femoral neck fracture. Electronically Signed   By: Lavonia Dana M.D.   On: 11/04/2015 13:12    ____________________________________________   PROCEDURES  Procedure(s) performed: None  Critical Care performed: No ____________________________________________   INITIAL IMPRESSION / ASSESSMENT AND PLAN / ED COURSE  Pertinent labs & imaging results that were available during my care of the patient were reviewed by me and considered in my medical decision making (see chart for details).  Probable hip fracture.  Ordering standard pre-op workup including CXR and ECG and appropriate blood work.  Patient is on Xarelto.  No indication of head injury.    Discussed with Dr. Mack Guise, and admitted to hospitalist.  Patient stable.   ____________________________________________  FINAL CLINICAL IMPRESSION(S) / ED  DIAGNOSES  Final diagnoses:  Femoral neck fracture, right, closed, initial encounter  Hx of long term use of blood thinners       NEW OUTPATIENT MEDICATIONS STARTED DURING THIS VISIT:  Current Discharge Medication List        Note:  This document was prepared using Dragon voice recognition software and may include unintentional dictation errors.   Hinda Kehr, MD 11/04/15 2140

## 2015-11-04 NOTE — Progress Notes (Signed)
Patient is nauseous from pain medicines. RN spoke with Dr. Mack Guise, PO meds held. Patient BP has been 155/88. MD notified Hydralazine ordered.   Deri Fuelling, RN

## 2015-11-05 LAB — CBC
HCT: 34.8 % — ABNORMAL LOW (ref 35.0–47.0)
Hemoglobin: 11.3 g/dL — ABNORMAL LOW (ref 12.0–16.0)
MCH: 28.2 pg (ref 26.0–34.0)
MCHC: 32.4 g/dL (ref 32.0–36.0)
MCV: 87.2 fL (ref 80.0–100.0)
PLATELETS: 230 10*3/uL (ref 150–440)
RBC: 3.99 MIL/uL (ref 3.80–5.20)
RDW: 14.8 % — ABNORMAL HIGH (ref 11.5–14.5)
WBC: 13.1 10*3/uL — AB (ref 3.6–11.0)

## 2015-11-05 LAB — URINALYSIS COMPLETE WITH MICROSCOPIC (ARMC ONLY)
BILIRUBIN URINE: NEGATIVE
Glucose, UA: 150 mg/dL — AB
LEUKOCYTES UA: NEGATIVE
NITRITE: NEGATIVE
PH: 5 (ref 5.0–8.0)
Protein, ur: 30 mg/dL — AB
Specific Gravity, Urine: 1.024 (ref 1.005–1.030)

## 2015-11-05 LAB — BASIC METABOLIC PANEL
ANION GAP: 7 (ref 5–15)
BUN: 18 mg/dL (ref 6–20)
CALCIUM: 8.7 mg/dL — AB (ref 8.9–10.3)
CO2: 29 mmol/L (ref 22–32)
Chloride: 100 mmol/L — ABNORMAL LOW (ref 101–111)
Creatinine, Ser: 0.59 mg/dL (ref 0.44–1.00)
Glucose, Bld: 194 mg/dL — ABNORMAL HIGH (ref 65–99)
POTASSIUM: 4.1 mmol/L (ref 3.5–5.1)
SODIUM: 136 mmol/L (ref 135–145)

## 2015-11-05 LAB — GLUCOSE, CAPILLARY
GLUCOSE-CAPILLARY: 227 mg/dL — AB (ref 65–99)
Glucose-Capillary: 187 mg/dL — ABNORMAL HIGH (ref 65–99)
Glucose-Capillary: 193 mg/dL — ABNORMAL HIGH (ref 65–99)
Glucose-Capillary: 150 mg/dL — ABNORMAL HIGH (ref 65–99)

## 2015-11-05 NOTE — Progress Notes (Signed)
[facility] NAME: [name]    MR#:  [id number]  DATE OF BIRTH:  [birth year]  SUBJECTIVE:  CHIEF COMPLAINT:   Chief Complaint  Patient presents with  . Fall  . Hip Pain   Right hip pain. REVIEW OF SYSTEMS:  CONSTITUTIONAL: No fever, Has generalized weakness.  EYES: No blurred or double vision.  EARS, NOSE, AND THROAT: No tinnitus or ear pain.  RESPIRATORY: No cough, shortness of breath, wheezing or hemoptysis.  CARDIOVASCULAR: No chest pain, orthopnea, edema.  GASTROINTESTINAL: No nausea, vomiting, diarrhea or abdominal pain.  GENITOURINARY: No dysuria, hematuria.  ENDOCRINE: No polyuria, nocturia,  HEMATOLOGY: No anemia, easy bruising or bleeding SKIN: No rash or lesion. MUSCULOSKELETAL: Right hip pain.   NEUROLOGIC: No tingling, numbness, weakness.  PSYCHIATRY: No anxiety or depression.   DRUG ALLERGIES:   Allergies  Allergen Reactions  . Codeine Nausea And Vomiting  . Penicillins Hives    Has patient had a PCN reaction causing immediate rash, facial/tongue/throat swelling, SOB or lightheadedness with hypotension: no  Has patient had a PCN reaction causing severe rash involving mucus membranes or skin necrosis: no  Has patient had a PCN reaction that required hospitalization: no  Has patient had a PCN reaction occurring within the last 10 years: no  If all of the above answers are "NO", then may proceed with Cephalosporin use.   . Sulfa Antibiotics   . Doxycycline Rash  . Erythromycin Rash  . Latex Rash    VITALS:  Blood pressure 139/51, pulse 65, temperature 98 F (36.7 C), temperature source Oral, resp. rate 18, height 5\' 4"  (1.626 m), weight 114 lb (51.71 kg), SpO2 98 %.  PHYSICAL EXAMINATION:  GENERAL:  80 y.o.-year-old patient lying in the bed with no acute distress.  EYES: Pupils equal, round, reactive to light and accommodation. No scleral icterus. Extraocular muscles intact.  HEENT: Head  atraumatic, normocephalic. Oropharynx and nasopharynx clear.  NECK:  Supple, no jugular venous distention. No thyroid enlargement, no tenderness.  LUNGS: Normal breath sounds bilaterally, no wheezing, rales,rhonchi or crepitation. No use of accessory muscles of respiration.  CARDIOVASCULAR: S1, S2 normal. No murmurs, rubs, or gallops.  ABDOMEN: Soft, nontender, nondistended. Bowel sounds present. No organomegaly or mass.  EXTREMITIES: No pedal edema, cyanosis, or clubbing.  NEUROLOGIC: Cranial nerves II through XII are intact. Muscle strength 5/5 in all extremities except right leg. Sensation intact. Gait not checked.  PSYCHIATRIC: The patient is alert and oriented x 3.  SKIN: No obvious rash, lesion, or ulcer.    LABORATORY PANEL:   CBC  Recent Labs Lab 11/05/15 0655  WBC 13.1*  HGB 11.3*  HCT 34.8*  PLT 230   ------------------------------------------------------------------------------------------------------------------  Chemistries   Recent Labs Lab 11/04/15 1204 11/05/15 0655  NA 138 136  K 5.0 4.1  CL 100* 100*  CO2 31 29  GLUCOSE 191* 194*  BUN 21* 18  CREATININE 0.86 0.59  CALCIUM 9.7 8.7*  MG 1.8  --   AST 29  --   ALT 22  --   ALKPHOS 102  --   BILITOT 0.5  --    ------------------------------------------------------------------------------------------------------------------  Cardiac Enzymes  Recent Labs Lab 11/04/15 1204  TROPONINI <0.03   ------------------------------------------------------------------------------------------------------------------  RADIOLOGY:  Dg Chest Portable 1 View  11/04/2015  CLINICAL DATA:  [place] at home today, RIGHT hip pain, RIGHT hip fracture EXAM: PORTABLE CHEST 1 VIEW COMPARISON:  07/01/2015 FINDINGS: Normal heart size, mediastinal contours, and pulmonary vascularity.  Atherosclerotic calcification aorta. Lungs clear. No pleural effusion or pneumothorax. Bones demineralized. IMPRESSION: No acute abnormalities.  Electronically Signed   By: Lavonia Dana M.D.   On: 11/04/2015 13:13   Dg Hip Unilat  With Pelvis 2-3 Views Right  11/04/2015  CLINICAL DATA:  [place] at home today landing on RIGHT hip, RIGHT hip pain, external rotation and shortening noted, initially encounter EXAM: DG HIP (WITH OR WITHOUT PELVIS) 2-3V RIGHT COMPARISON:  03/26/2015 FINDINGS: Diffuse osseous demineralization. LEFT hip prosthesis identified. New displaced RIGHT femoral neck fracture without dislocation. Pelvis intact. Degenerative changes at visualized lower lumbar spine. IMPRESSION: Displaced RIGHT femoral neck fracture. Electronically Signed   By: Lavonia Dana M.D.   On: 11/04/2015 13:12    EKG:   Orders placed or performed during the hospital encounter of 11/04/15  . EKG 12-Lead  . EKG 12-Lead  . ED EKG  . ED EKG    ASSESSMENT AND PLAN:   [name] is a 80 y.o. female with a known history of Multiple medical problems including diabetes mellitus, COPD not on home oxygen, SLE, Raynaud, rheumatic fever, hypertension, peptic ulcer disease, paroxysmal atrial fibrillation on xarelto, recent admission for fall and left hip fracture 6 months ago admitted after a fall and right femoral neck fracture.  #1 right femoral neck fracture- due to mechanical fall. Multiple falls history Ortho consulted, moderate risk for surgery- hold xarelto, wait 48 hrs from last dose continue metoprolol,  pain control - post op DVT prophylaxis and physical therapy.  #2 paroxysmal atrial fibrillation- on amiodarone. Now in NSR Due to her falls history- not a good candidate for anticoagulation- will need to check with her cardiologist For now hold [place] for hip surgery. continue metoprolol  #3 hyperkalemia. Improved.  #4 multiple falls and memory loss- seeing Dr. Melrose Nakayama, couldn't tolerate aricept.awaiting second opinion from [place] neurology Physical therapy  #5 diabetes mellitus- hold tradjenta and metformin, continuessi   I discussed with  Dr. Mack Guise. All the records are reviewed and case discussed with Care Management/Social Workerr. Management plans discussed with the patient, Her daughter and they are in agreement.  CODE STATUS: Full code  TOTAL TIME TAKING CARE OF THIS PATIENT: 37 minutes.  Greater than 50% time was spent on coordination of care and face-to-face counseling.  POSSIBLE D/C IN 3 DAYS, DEPENDING ON CLINICAL CONDITION.   Demetrios Loll M.D on 11/05/2015 at 2:08 PM  Between 7am to 6pm - Pager - [phone]  After 6pm go to [email or web address] - password [email or web address] [facility]  Office  [phone]  CC: Primary care physician; Halina Maidens, MD

## 2015-11-05 NOTE — Care Management Note (Signed)
Case Management Note  Patient Details  Name: [name] MRN: [id number] Date of Birth: 03-Dec-1935  Subjective/Objective:                  Met with patient prior to surgery to discuss discharge planning. Both patient and her husband want her to go to [facility] for SNf at discharge. She is on chronic Xarelto. She in only on O2 at night through [employer] and will need new orders if needed continuous- please contact CM. She states she is falling more frequently and has been referred to [facility] as they do not know why she is falling. Her PCP is in [place]- Dr. Santa Lighter. She uses [facility] for Rx. She has a front-wheeled walker available at home.  Action/Plan: List of home health agencies left with patient. RNCM will continue to follow. Updated CSW.   Expected Discharge Date:                  Expected Discharge Plan:     In-House Referral:     Discharge planning Services  CM Consult  Post Acute Care Choice:  Home Health Choice offered to:  Patient  DME Arranged:    DME Agency:     HH Arranged:    [place] Agency:     Status of Service:  In process, will continue to follow  Medicare Important Message Given:    Date Medicare IM Given:    Medicare IM give by:    Date Additional Medicare IM Given:    Additional Medicare Important Message give by:     If discussed at [place] of Stay Meetings, dates discussed:    Additional Comments:  Marshell Garfinkel, RN 11/05/2015, 11:07 AM

## 2015-11-05 NOTE — Clinical Social Work Note (Signed)
Clinical Social Work Assessment  Patient Details  Name: [name] MRN: [id number] Date of Birth: Apr 19, 1936  Date of referral:  11/05/15               Reason for consult:  Facility Placement                Permission sought to share information with:  Family Supports Permission granted to share information::  Yes, Verbal Permission Granted  Name::     Cledith Kamiya, husband   Housing/Transportation Living arrangements for the past 2 months:  [place] of Information:  Patient, Adult Children, Spouse Patient Interpreter Needed:  None Criminal Activity/Legal Involvement Pertinent to Current Situation/Hospitalization:  No - Comment as needed Significant Relationships:  Adult Children, Spouse Lives with:  Spouse Do you feel safe going back to the place where you live?  Yes Need for family participation in patient care:  Yes (Comment)  Care giving concerns:  No care giving concerns identified.   Social Worker assessment / plan:  CSW was consulted for New SNF as pt may need SNF at discharge. CSW met with pt and husband to address consult. CSW introduced herself and explained role of social work. CSW also explained the process of discharging to SNF for STR. Pt has a hip fracture and will have surgery tomorrow, per Ortho note. PT consult is pending. Pt and husband would like for pt to go to [facility] as she has been there before. CSW initiated SNF search and will follow up with bed offers. CSW also followed up with [facility] as it is pt's first choice. CSW will continue to follow.   Employment status:  Retired Forensic scientist:  Medicare PT Recommendations:  Not assessed at this time (Surgery 5/27) Information / Referral to community resources:  [place]  Patient/Family's Response to care:  Pt and husband were appreciative of CSW support.   Patient/Family's Understanding of and Emotional Response to Diagnosis, Current Treatment, and  Prognosis:  Pt understands that she may need to go to SNF for STR.   Emotional Assessment Appearance:  Appears stated age Attitude/Demeanor/Rapport:  Other Affect (typically observed):  Accepting, Pleasant Orientation:  Oriented to Self, Oriented to Place, Fluctuating Orientation (Suspected and/or reported [place]) Alcohol / Substance use:  Never Used Psych involvement (Current and /or in the community):  No (Comment)  Discharge Needs  Concerns to be addressed:  Adjustment to Illness Readmission within the last 30 days:  No Current discharge risk:  Chronically ill Barriers to Discharge:  Continued Medical Work up   [employer], LCSW 11/05/2015, 3:37 PM

## 2015-11-05 NOTE — NC FL2 (Signed)
[place] LEVEL OF CARE SCREENING TOOL     IDENTIFICATION  Patient Name: [name] Birthdate: 12-Aug-1935 Sex: female Admission Date (Current Location): 11/04/2015  [place] and [place] Number:  Engineering geologist and Address:  [facility], [address] [place], [place] [address]      Provider Number: [id number]  Attending Physician Name and Address:  Demetrios Loll, MD  Relative Name and Phone Number:       Current Level of Care: Hospital Recommended Level of Care: [place] Prior Approval Number:    Date Approved/Denied:   PASRR Number: [id number]  Discharge Plan: SNF    Current Diagnoses: Patient Active Problem List   Diagnosis Date Noted  . Hip fracture ([place]) 11/04/2015  . Chronic respiratory failure with hypoxia ([place]) 10/27/2015  . Mixed Alzheimer's and vascular dementia 07/29/2015  . Difficulty in walking 07/29/2015  . Type 2 diabetes mellitus with stage 1 chronic kidney disease, without long-term current use of insulin ([place]) 07/16/2015  . Tremor observed on examination 07/16/2015  . Urinary retention 07/12/2015  . Pressure ulcer of left heel 07/12/2015  . Malnutrition of moderate degree 07/03/2015  . Multi-infarct dementia 07/03/2015  . 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 Paroxysmal atrial fibrillation ([place]) 06/25/2015  . Chronic diastolic CHF (congestive heart failure) ([place])   . Left displaced femoral neck fracture ([place])   . Fall   . GERD (gastroesophageal reflux disease) 03/26/2015  . Anxiety 03/26/2015  . Gastroduodenal ulcer 01/05/2015  . Difficulty swallowing solids   . Esophageal candidiasis ([place])   . Acquired hypothyroidism 08/18/2014  . Systemic lupus erythematosus ([place]) 04/03/2014  . Severe chronic obstructive pulmonary disease ([place]) 10/28/2013    Orientation RESPIRATION BLADDER Height & Weight     Self, Time, Situation, Place  O2 (2 liters) Continent Weight: 114 lb (51.71  kg) Height:  5\' 4"  (162.6 cm)  BEHAVIORAL SYMPTOMS/MOOD NEUROLOGICAL BOWEL NUTRITION STATUS   (none)   Continent Diet (heart healthy)  AMBULATORY STATUS COMMUNICATION OF NEEDS Skin   Limited Assist   Surgical wounds                       Personal Care Assistance Level of Assistance  Bathing, Dressing Bathing Assistance: Limited assistance   Dressing Assistance: Limited assistance     Functional Limitations Info             SPECIAL CARE FACTORS FREQUENCY  PT (By licensed PT)                    Contractures Contractures Info: Not present    Additional Factors Info  Code Status, Allergies Code Status Info: full Allergies Info: codeine;pcn;sulfa abx;doxy;erythromycin;latex           Current Medications (11/05/2015):  This is the current hospital active medication list Current Facility-Administered Medications  Medication Dose Route Frequency Provider Last Rate Last Dose  . 0.9 %  sodium chloride infusion   Intravenous Continuous Gladstone Lighter, MD 50 mL/hr at 11/05/15 0252    . acetaminophen (TYLENOL) tablet 650 mg  650 mg Oral Q6H PRN Gladstone Lighter, MD   650 mg at 11/05/15 1341   Or  . acetaminophen (TYLENOL) suppository 650 mg  650 mg Rectal Q6H PRN Gladstone Lighter, MD      . acidophilus (RISAQUAD) capsule 1 capsule  1 capsule Oral Daily Lenis Noon, RPH   1 capsule at 11/05/15 1025  . albuterol (PROVENTIL) (2.5  MG/3ML) 0.083% nebulizer solution 2.5 mg  2.5 mg Nebulization Q6H PRN Gladstone Lighter, MD      . amiodarone (PACERONE) tablet 200 mg  200 mg Oral Daily Gladstone Lighter, MD   200 mg at 11/05/15 1039  . antiseptic oral rinse (CPC / CETYLPYRIDINIUM CHLORIDE 0.05%) solution 7 mL  7 mL Mouth Rinse BID Gladstone Lighter, MD   7 mL at 11/04/15 2225  . docusate sodium (COLACE) capsule 100 mg  100 mg Oral BID Gladstone Lighter, MD   100 mg at 11/05/15 1025  . famotidine (PEPCID) tablet 20 mg  20 mg Oral Daily Gladstone Lighter, MD   20 mg at  11/05/15 1025  . hydrALAZINE (APRESOLINE) injection 10 mg  10 mg Intravenous Q6H PRN Gladstone Lighter, MD   10 mg at 11/04/15 1906  . HYDROcodone-acetaminophen (NORCO/VICODIN) 5-325 MG per tablet 1-2 tablet  1-2 tablet Oral Q4H PRN Thornton Park, MD   1 tablet at 11/05/15 0426  . HYDROmorphone (DILAUDID) injection 0.5 mg  0.5 mg Intravenous Q2H PRN Thornton Park, MD      . insulin aspart (novoLOG) injection 0-5 Units  0-5 Units Subcutaneous QHS Gladstone Lighter, MD   2 Units at 11/04/15 2221  . insulin aspart (novoLOG) injection 0-9 Units  0-9 Units Subcutaneous TID WC Gladstone Lighter, MD   2 Units at 11/05/15 1201  . metFORMIN (GLUCOPHAGE-XR) 24 hr tablet 500 mg  500 mg Oral QAC supper Gladstone Lighter, MD   Stopped at 11/04/15 1750  . metoprolol tartrate (LOPRESSOR) tablet 25 mg  25 mg Oral BID Gladstone Lighter, MD   25 mg at 11/05/15 1038  . mometasone-formoterol (DULERA) 100-5 MCG/ACT inhaler 2 puff  2 puff Inhalation BID Gladstone Lighter, MD   2 puff at 11/05/15 1039  . ondansetron (ZOFRAN) tablet 4 mg  4 mg Oral Q6H PRN Gladstone Lighter, MD       Or  . ondansetron (ZOFRAN) injection 4 mg  4 mg Intravenous Q6H PRN Gladstone Lighter, MD   4 mg at 11/04/15 1550  . oxyCODONE (Oxy IR/ROXICODONE) immediate release tablet 10-15 mg  10-15 mg Oral Q4H PRN Thornton Park, MD   10 mg at 11/05/15 0006  . polyethylene glycol (MIRALAX / GLYCOLAX) packet 17 g  17 g Oral Daily PRN Gladstone Lighter, MD      . tiotropium (SPIRIVA) inhalation capsule 18 mcg  18 mcg Inhalation Daily Gladstone Lighter, MD   18 mcg at 11/05/15 1039  . vancomycin (VANCOCIN) IVPB 1000 mg/200 mL premix  1,000 mg Intravenous Once Thornton Park, MD         Discharge Medications: Please see discharge summary for a list of discharge medications.  Relevant Imaging Results:  Relevant Lab Results:   Additional Information SS: [id number]  Shela Leff, LCSW

## 2015-11-05 NOTE — Progress Notes (Signed)
Plan of care discussed with patient and family. Patient to be NPO pending surgical day. Pain controlled with PRN medication.

## 2015-11-05 NOTE — Progress Notes (Signed)
Inpatient Diabetes Program Recommendations  AACE/ADA: New Consensus Statement on Inpatient Glycemic Control (2015)  Target Ranges:  Prepandial:   less than 140 mg/dL      Peak postprandial:   less than 180 mg/dL (1-2 hours)      Critically ill patients:  140 - 180 mg/dL   Review of Glycemic Control:  Results for [name], [name] (MRN [id number]) as of 11/05/2015 09:42  Ref. Range 11/04/2015 16:14 11/04/2015 21:39 11/05/2015 07:25  Glucose-Capillary Latest Ref Range: 65-99 mg/dL 105 (H) 218 (H) 187 (H)   Diabetes history:  Outpatient Diabetes medications: Januvia 50 mg daily, Metformin 500 mg with supper Current orders for Inpatient glycemic control:  Novolog sensitive tid with meals and HS, Metformin XR 500 mg with supper, Tradjenta 5 mg daily  Inpatient Diabetes Program Recommendations:    Consider holding oral diabetes medications while patient is in the hospital. Further may consider adding Levemir 8 units daily to meet basal insulin needs.   Thanks, Adah Perl, RN, BC-ADM Inpatient Diabetes Coordinator Pager [phone] (8a-5p)

## 2015-11-05 NOTE — Clinical Social Work Placement (Signed)
   CLINICAL SOCIAL WORK PLACEMENT  NOTE  Date:  11/05/2015  Patient Details  Name: [name] MRN: [id number] Date of Birth: 1935/08/14  Clinical Social Work is seeking post-discharge placement for this patient at the [place] level of care (*CSW will initial, date and re-position this form in  chart as items are completed):  Yes   Patient/family provided with [place] Work Department's list of facilities offering this level of care within the geographic area requested by the patient (or if unable, by the patient's family).  Yes   Patient/family informed of their freedom to choose among providers that offer the needed level of care, that participate in Medicare, Medicaid or managed care program needed by the patient, have an available bed and are willing to accept the patient.  Yes   Patient/family informed of [facility]'s ownership interest in [facility] and [facility], as well as of the fact that they are under no obligation to receive care at these facilities.  PASRR submitted to EDS on       PASRR number received on       Existing PASRR number confirmed on 11/05/15     FL2 transmitted to all facilities in geographic area requested by pt/family on 11/05/15     FL2 transmitted to all facilities within larger geographic area on       Patient informed that his/her managed care company has contracts with or will negotiate with certain facilities, including the following:            Patient/family informed of bed offers received.  Patient chooses bed at       Physician recommends and patient chooses bed at      Patient to be transferred to   on  .  Patient to be transferred to facility by       Patient family notified on   of transfer.  Name of family member notified:        PHYSICIAN       Additional Comment:    _______________________________________________ Darden Dates, LCSW 11/05/2015, 3:28 PM

## 2015-11-05 NOTE — Progress Notes (Signed)
Subjective:  Patient is seen in her hospital bed today. She has a right femoral neck hip fracture which is displaced. Patient came in and Xarelto for atrial fibrillation. Due to this blood thinning medication her surgery has been postponed until tomorrow morning. Patient is much more alert today. Her pain is currently controlled. Her husband and children at the bedside.   Objective:   VITALS:   Filed Vitals:   11/05/15 1026 11/05/15 1038 11/05/15 1153 11/05/15 1526  BP: 121/44 126/58 139/51 115/42  Pulse: 60  65 59  Temp:    97.8 F (36.6 C)  TempSrc:    Oral  Resp:    18  Height:      Weight:      SpO2:   98% 100%    PHYSICAL EXAM:  Right lower extremity: Patient's skin remains intact. She remains neurovascularly intact. Buck's traction in place.  LABS  Results for orders placed or performed during the hospital encounter of 11/04/15 (from the past 24 hour(s))  CBC WITH DIFFERENTIAL     Status: Abnormal   Collection Time: 11/04/15  6:28 PM  Result Value Ref Range   WBC 13.7 (H) 3.6 - 11.0 K/uL   RBC 4.21 3.80 - 5.20 MIL/uL   Hemoglobin 11.9 (L) 12.0 - 16.0 g/dL   HCT 37.1 35.0 - 47.0 %   MCV 88.3 80.0 - 100.0 fL   MCH 28.3 26.0 - 34.0 pg   MCHC 32.0 32.0 - 36.0 g/dL   RDW 15.1 (H) 11.5 - 14.5 %   Platelets 229 150 - 440 K/uL   Neutrophils Relative % 88% %   Neutro Abs 11.9 (H) 1.4 - 6.5 K/uL   Lymphocytes Relative 5% %   Lymphs Abs 0.7 (L) 1.0 - 3.6 K/uL   Monocytes Relative 7% %   Monocytes Absolute 1.0 (H) 0.2 - 0.9 K/uL   Eosinophils Relative 0% %   Eosinophils Absolute 0.0 0 - 0.7 K/uL   Basophils Relative 0% %   Basophils Absolute 0.0 0 - 0.1 K/uL  Albumin     Status: None   Collection Time: 11/04/15  6:28 PM  Result Value Ref Range   Albumin 3.6 3.5 - 5.0 g/dL  Glucose, capillary     Status: Abnormal   Collection Time: 11/04/15  9:39 PM  Result Value Ref Range   Glucose-Capillary 218 (H) 65 - 99 mg/dL   Comment 1 Notify RN   CBC     Status: Abnormal    Collection Time: 11/05/15  6:55 AM  Result Value Ref Range   WBC 13.1 (H) 3.6 - 11.0 K/uL   RBC 3.99 3.80 - 5.20 MIL/uL   Hemoglobin 11.3 (L) 12.0 - 16.0 g/dL   HCT 34.8 (L) 35.0 - 47.0 %   MCV 87.2 80.0 - 100.0 fL   MCH 28.2 26.0 - 34.0 pg   MCHC 32.4 32.0 - 36.0 g/dL   RDW 14.8 (H) 11.5 - 14.5 %   Platelets 230 150 - 440 K/uL  Basic metabolic panel     Status: Abnormal   Collection Time: 11/05/15  6:55 AM  Result Value Ref Range   Sodium 136 135 - 145 mmol/L   Potassium 4.1 3.5 - 5.1 mmol/L   Chloride 100 (L) 101 - 111 mmol/L   CO2 29 22 - 32 mmol/L   Glucose, Bld 194 (H) 65 - 99 mg/dL   BUN 18 6 - 20 mg/dL   Creatinine, Ser 0.59 0.44 - 1.00 mg/dL  Calcium 8.7 (L) 8.9 - 10.3 mg/dL   GFR calc non Af Amer >60 >60 mL/min   GFR calc Af Amer >60 >60 mL/min   Anion gap 7 5 - 15  Glucose, capillary     Status: Abnormal   Collection Time: 11/05/15  7:25 AM  Result Value Ref Range   Glucose-Capillary 187 (H) 65 - 99 mg/dL   Comment 1 Notify RN   Glucose, capillary     Status: Abnormal   Collection Time: 11/05/15 11:31 AM  Result Value Ref Range   Glucose-Capillary 193 (H) 65 - 99 mg/dL  Urinalysis complete, with microscopic ([facility] only)     Status: Abnormal   Collection Time: 11/05/15 11:59 AM  Result Value Ref Range   Color, Urine YELLOW (A) YELLOW   APPearance HAZY (A) CLEAR   Glucose, UA 150 (A) NEGATIVE mg/dL   Bilirubin Urine NEGATIVE NEGATIVE   Ketones, ur 1+ (A) NEGATIVE mg/dL   Specific Gravity, Urine 1.024 1.005 - 1.030   Hgb urine dipstick 2+ (A) NEGATIVE   pH 5.0 5.0 - 8.0   Protein, ur 30 (A) NEGATIVE mg/dL   Nitrite NEGATIVE NEGATIVE   Leukocytes, UA NEGATIVE NEGATIVE   RBC / HPF TOO NUMEROUS TO COUNT 0 - 5 RBC/hpf   WBC, UA 6-30 0 - 5 WBC/hpf   Bacteria, UA RARE (A) NONE SEEN   Squamous Epithelial / LPF 0-5 (A) NONE SEEN   Mucous PRESENT     Dg Chest Portable 1 View  11/04/2015  CLINICAL DATA:  [place] at home today, RIGHT hip pain, RIGHT hip fracture  EXAM: PORTABLE CHEST 1 VIEW COMPARISON:  07/01/2015 FINDINGS: Normal heart size, mediastinal contours, and pulmonary vascularity. Atherosclerotic calcification aorta. Lungs clear. No pleural effusion or pneumothorax. Bones demineralized. IMPRESSION: No acute abnormalities. Electronically Signed   By: Lavonia Dana M.D.   On: 11/04/2015 13:13   Dg Hip Unilat  With Pelvis 2-3 Views Right  11/04/2015  CLINICAL DATA:  [place] at home today landing on RIGHT hip, RIGHT hip pain, external rotation and shortening noted, initially encounter EXAM: DG HIP (WITH OR WITHOUT PELVIS) 2-3V RIGHT COMPARISON:  03/26/2015 FINDINGS: Diffuse osseous demineralization. LEFT hip prosthesis identified. New displaced RIGHT femoral neck fracture without dislocation. Pelvis intact. Degenerative changes at visualized lower lumbar spine. IMPRESSION: Displaced RIGHT femoral neck fracture. Electronically Signed   By: Lavonia Dana M.D.   On: 11/04/2015 13:12    Assessment/Plan:     Active Problems:   Hip fracture [facility])  Patient is scheduled for surgery tomorrow at 8 AM. This will be greater than 48 hours off of Xarelto. Continue current pain management in the meantime. I explained to the patient my recommendation for right hip hemiarthroplasty. She had the same procedure performed on the left hip by my partner Dr. Sabra Heck. Patient is aware of the surgery and the postoperative course. Patient and family were in agreement with the plan for surgery.    [place] , MD 11/05/2015, 5:10 PM

## 2015-11-05 NOTE — Care Management (Addendum)
Potentially a MEDICARE BUNDLE PATIENT. Patient was here in January 2017 and went home with [facility]. She has a walker and bedside commode. She received urinary cath support at that time. O2. Chronic Xarelto. RNCM will continue to follow.

## 2015-11-06 ENCOUNTER — Encounter: Payer: Self-pay | Admitting: Anesthesiology

## 2015-11-06 ENCOUNTER — Inpatient Hospital Stay: Payer: Medicare Other | Admitting: Anesthesiology

## 2015-11-06 ENCOUNTER — Encounter
Admission: EM | Disposition: A | Discharge status: Skilled Nursing Facility | Payer: Self-pay | Source: Home / Self Care | Attending: Internal Medicine

## 2015-11-06 ENCOUNTER — Inpatient Hospital Stay: Payer: Medicare Other

## 2015-11-06 HISTORY — PX: HIP ARTHROPLASTY: SHX981

## 2015-11-06 LAB — CBC
HEMATOCRIT: 32.7 % — AB (ref 35.0–47.0)
Hemoglobin: 10.6 g/dL — ABNORMAL LOW (ref 12.0–16.0)
MCH: 28.8 pg (ref 26.0–34.0)
MCHC: 32.6 g/dL (ref 32.0–36.0)
MCV: 88.3 fL (ref 80.0–100.0)
Platelets: 204 10*3/uL (ref 150–440)
RBC: 3.7 MIL/uL — ABNORMAL LOW (ref 3.80–5.20)
RDW: 15 % — AB (ref 11.5–14.5)
WBC: 11.7 10*3/uL — ABNORMAL HIGH (ref 3.6–11.0)

## 2015-11-06 LAB — GLUCOSE, CAPILLARY
GLUCOSE-CAPILLARY: 284 mg/dL — AB (ref 65–99)
Glucose-Capillary: 157 mg/dL — ABNORMAL HIGH (ref 65–99)
Glucose-Capillary: 278 mg/dL — ABNORMAL HIGH (ref 65–99)

## 2015-11-06 SURGERY — HEMIARTHROPLASTY, HIP, DIRECT ANTERIOR APPROACH, FOR FRACTURE
Anesthesia: General | Laterality: Right

## 2015-11-06 MED ORDER — OXYCODONE HCL 5 MG PO TABS: 5.0000 mg | ORAL_TABLET | Freq: Once | ORAL | Status: DC | PRN

## 2015-11-06 MED ORDER — ROCURONIUM BROMIDE 100 MG/10ML IV SOLN
INTRAVENOUS | Status: DC | PRN
  Administered 2015-11-06: 15 mg via INTRAVENOUS
  Administered 2015-11-06: 5 mg via INTRAVENOUS

## 2015-11-06 MED ORDER — ACETAMINOPHEN 325 MG PO TABS
650.0000 mg | ORAL_TABLET | Freq: Four times a day (QID) | ORAL | Status: DC | PRN
  Administered 2015-11-07 – 2015-11-09 (×4): 650 mg via ORAL
  Filled 2015-11-06 (×4): qty 2

## 2015-11-06 MED ORDER — BISACODYL 10 MG RE SUPP
10.0000 mg | Freq: Every day | RECTAL | Status: DC | PRN
  Administered 2015-11-08 – 2015-11-09 (×2): 10 mg via RECTAL
  Filled 2015-11-06 (×2): qty 1

## 2015-11-06 MED ORDER — FENTANYL CITRATE (PF) 100 MCG/2ML IJ SOLN
INTRAMUSCULAR | Status: AC
  Filled 2015-11-06: qty 2

## 2015-11-06 MED ORDER — SENNA 8.6 MG PO TABS: 1.0000 | ORAL_TABLET | Freq: Two times a day (BID) | ORAL | Status: DC

## 2015-11-06 MED ORDER — MAGNESIUM CITRATE PO SOLN
1.0000 | Freq: Once | ORAL | Status: DC | PRN
  Filled 2015-11-06: qty 296

## 2015-11-06 MED ORDER — METHOCARBAMOL 1000 MG/10ML IJ SOLN
500.0000 mg | Freq: Four times a day (QID) | INTRAVENOUS | Status: DC | PRN
  Filled 2015-11-06: qty 5

## 2015-11-06 MED ORDER — ALUM & MAG HYDROXIDE-SIMETH 200-200-20 MG/5ML PO SUSP: 30.0000 mL | ORAL | Status: DC | PRN

## 2015-11-06 MED ORDER — DEXAMETHASONE SODIUM PHOSPHATE 10 MG/ML IJ SOLN
INTRAMUSCULAR | Status: DC | PRN
  Administered 2015-11-06: 5 mg via INTRAVENOUS

## 2015-11-06 MED ORDER — METOPROLOL TARTRATE 25 MG PO TABS
25.0000 mg | ORAL_TABLET | Freq: Two times a day (BID) | ORAL | Status: DC
  Administered 2015-11-06 – 2015-11-10 (×8): 25 mg via ORAL
  Filled 2015-11-06 (×8): qty 1

## 2015-11-06 MED ORDER — OXYCODONE HCL 5 MG/5ML PO SOLN: 5.0000 mg | Freq: Once | ORAL | Status: DC | PRN

## 2015-11-06 MED ORDER — RIVAROXABAN 10 MG PO TABS
10.0000 mg | ORAL_TABLET | Freq: Every day | ORAL | Status: DC
  Administered 2015-11-07: 10 mg via ORAL
  Filled 2015-11-06: qty 1

## 2015-11-06 MED ORDER — VANCOMYCIN HCL 1000 MG IV SOLR
INTRAVENOUS | Status: AC
  Filled 2015-11-06: qty 1000

## 2015-11-06 MED ORDER — HYDROMORPHONE HCL 1 MG/ML IJ SOLN
0.5000 mg | INTRAMUSCULAR | Status: DC | PRN
  Administered 2015-11-07: 0.5 mg via INTRAVENOUS
  Filled 2015-11-06 (×2): qty 1

## 2015-11-06 MED ORDER — LACTATED RINGERS IV SOLN
INTRAVENOUS | Status: DC | PRN
  Administered 2015-11-06: 09:00:00 via INTRAVENOUS

## 2015-11-06 MED ORDER — PHENOL 1.4 % MT LIQD
1.0000 | OROMUCOSAL | Status: DC | PRN
Start: 2015-11-06 — End: 2015-11-10
  Filled 2015-11-06: qty 177

## 2015-11-06 MED ORDER — ACETAMINOPHEN 650 MG RE SUPP: 650.0000 mg | Freq: Four times a day (QID) | RECTAL | Status: DC | PRN

## 2015-11-06 MED ORDER — ONDANSETRON HCL 4 MG/2ML IJ SOLN
INTRAMUSCULAR | Status: DC | PRN
  Administered 2015-11-06: 4 mg via INTRAVENOUS

## 2015-11-06 MED ORDER — NEOMYCIN-POLYMYXIN B GU 40-200000 IR SOLN
Status: AC
  Filled 2015-11-06: qty 20

## 2015-11-06 MED ORDER — METHOCARBAMOL 500 MG PO TABS
500.0000 mg | ORAL_TABLET | Freq: Four times a day (QID) | ORAL | Status: DC | PRN
  Administered 2015-11-07: 500 mg via ORAL
  Filled 2015-11-06: qty 1

## 2015-11-06 MED ORDER — EPHEDRINE SULFATE 50 MG/ML IJ SOLN
INTRAMUSCULAR | Status: DC | PRN
  Administered 2015-11-06 (×4): 5 mg via INTRAVENOUS

## 2015-11-06 MED ORDER — SUGAMMADEX SODIUM 200 MG/2ML IV SOLN
INTRAVENOUS | Status: DC | PRN
  Administered 2015-11-06: 100 mg via INTRAVENOUS

## 2015-11-06 MED ORDER — CLINDAMYCIN PHOSPHATE 600 MG/50ML IV SOLN
600.0000 mg | Freq: Four times a day (QID) | INTRAVENOUS | Status: AC
  Administered 2015-11-06 (×2): 600 mg via INTRAVENOUS
  Filled 2015-11-06 (×2): qty 50

## 2015-11-06 MED ORDER — PROPOFOL 10 MG/ML IV BOLUS
INTRAVENOUS | Status: DC | PRN
  Administered 2015-11-06: 60 mg via INTRAVENOUS

## 2015-11-06 MED ORDER — LINAGLIPTIN 5 MG PO TABS
5.0000 mg | ORAL_TABLET | Freq: Every day | ORAL | Status: DC
  Administered 2015-11-06 – 2015-11-10 (×3): 5 mg via ORAL
  Filled 2015-11-06 (×4): qty 1

## 2015-11-06 MED ORDER — ONDANSETRON HCL 4 MG PO TABS: 4.0000 mg | ORAL_TABLET | Freq: Four times a day (QID) | ORAL | Status: DC | PRN

## 2015-11-06 MED ORDER — ALBUTEROL SULFATE HFA 108 (90 BASE) MCG/ACT IN AERS: 2.0000 | INHALATION_SPRAY | Freq: Four times a day (QID) | RESPIRATORY_TRACT | Status: DC | PRN

## 2015-11-06 MED ORDER — DOCUSATE SODIUM 100 MG PO CAPS
100.0000 mg | ORAL_CAPSULE | Freq: Two times a day (BID) | ORAL | Status: DC
  Administered 2015-11-06 – 2015-11-10 (×6): 100 mg via ORAL
  Filled 2015-11-06 (×7): qty 1

## 2015-11-06 MED ORDER — FENTANYL CITRATE (PF) 100 MCG/2ML IJ SOLN
INTRAMUSCULAR | Status: DC | PRN
  Administered 2015-11-06: 50 ug via INTRAVENOUS

## 2015-11-06 MED ORDER — FERROUS SULFATE 325 (65 FE) MG PO TABS
325.0000 mg | ORAL_TABLET | Freq: Three times a day (TID) | ORAL | Status: DC
  Administered 2015-11-06 – 2015-11-10 (×6): 325 mg via ORAL
  Filled 2015-11-06 (×7): qty 1

## 2015-11-06 MED ORDER — MEMANTINE HCL 10 MG PO TABS: 5.0000 mg | ORAL_TABLET | Freq: Two times a day (BID) | ORAL | Status: DC

## 2015-11-06 MED ORDER — ACETAMINOPHEN 10 MG/ML IV SOLN
INTRAVENOUS | Status: AC
  Filled 2015-11-06: qty 100

## 2015-11-06 MED ORDER — ACETAMINOPHEN 10 MG/ML IV SOLN
INTRAVENOUS | Status: DC | PRN
  Administered 2015-11-06: 1000 mg via INTRAVENOUS

## 2015-11-06 MED ORDER — FENTANYL CITRATE (PF) 100 MCG/2ML IJ SOLN
25.0000 ug | INTRAMUSCULAR | Status: DC | PRN
  Administered 2015-11-06: 12.5 ug via INTRAVENOUS

## 2015-11-06 MED ORDER — SODIUM CHLORIDE 0.9 % IV SOLN
75.0000 mL/h | INTRAVENOUS | Status: DC
  Administered 2015-11-06 (×2): 75 mL/h via INTRAVENOUS

## 2015-11-06 MED ORDER — SUCCINYLCHOLINE CHLORIDE 20 MG/ML IJ SOLN
INTRAMUSCULAR | Status: DC | PRN
  Administered 2015-11-06: 80 mg via INTRAVENOUS

## 2015-11-06 MED ORDER — MENTHOL 3 MG MT LOZG
1.0000 | LOZENGE | OROMUCOSAL | Status: DC | PRN
  Filled 2015-11-06: qty 9

## 2015-11-06 MED ORDER — INSULIN ASPART 100 UNIT/ML ~~LOC~~ SOLN
0.0000 [IU] | Freq: Three times a day (TID) | SUBCUTANEOUS | Status: DC
  Administered 2015-11-07: 3 [IU] via SUBCUTANEOUS
  Administered 2015-11-08 (×2): 2 [IU] via SUBCUTANEOUS
  Administered 2015-11-09: 5 [IU] via SUBCUTANEOUS
  Administered 2015-11-09 – 2015-11-10 (×3): 2 [IU] via SUBCUTANEOUS
  Administered 2015-11-10: 3 [IU] via SUBCUTANEOUS
  Filled 2015-11-06 (×3): qty 2
  Filled 2015-11-06: qty 3
  Filled 2015-11-06: qty 2
  Filled 2015-11-06 (×2): qty 3
  Filled 2015-11-06: qty 5

## 2015-11-06 MED ORDER — OXYCODONE HCL 5 MG PO TABS
5.0000 mg | ORAL_TABLET | ORAL | Status: DC | PRN
  Administered 2015-11-06: 5 mg via ORAL
  Administered 2015-11-07: 10 mg via ORAL
  Administered 2015-11-09: 5 mg via ORAL
  Administered 2015-11-10: 10 mg via ORAL
  Filled 2015-11-06 (×2): qty 1
  Filled 2015-11-06 (×2): qty 2

## 2015-11-06 MED ORDER — MIDAZOLAM HCL 2 MG/2ML IJ SOLN
INTRAMUSCULAR | Status: DC | PRN
  Administered 2015-11-06: 1 mg via INTRAVENOUS

## 2015-11-06 MED ORDER — LIDOCAINE HCL (CARDIAC) 20 MG/ML IV SOLN
INTRAVENOUS | Status: DC | PRN
Start: 2015-11-06 — End: 2015-11-06
  Administered 2015-11-06: 50 mg via INTRAVENOUS

## 2015-11-06 MED ORDER — SENNA 8.6 MG PO TABS
1.0000 | ORAL_TABLET | Freq: Two times a day (BID) | ORAL | Status: DC
  Administered 2015-11-06 – 2015-11-10 (×8): 8.6 mg via ORAL
  Filled 2015-11-06 (×8): qty 1

## 2015-11-06 MED ORDER — NEOMYCIN-POLYMYXIN B GU 40-200000 IR SOLN
Status: DC | PRN
  Administered 2015-11-06: 16 mL

## 2015-11-06 MED ORDER — ONDANSETRON HCL 4 MG/2ML IJ SOLN: 4.0000 mg | Freq: Four times a day (QID) | INTRAMUSCULAR | Status: DC | PRN

## 2015-11-06 SURGICAL SUPPLY — 49 items
BLADE SAGITTAL WIDE XTHICK NO (BLADE) ×3 IMPLANT
BLADE SURG SZ10 CARB STEEL (BLADE) ×3 IMPLANT
CANISTER SUCT 1200ML W/VALVE (MISCELLANEOUS) ×3 IMPLANT
CANISTER SUCT 3000ML PPV (MISCELLANEOUS) ×6 IMPLANT
CAPT HIP HEMI 1 ×3 IMPLANT
DRAPE IMP U-DRAPE 54X76 (DRAPES) ×3 IMPLANT
DRAPE INCISE IOBAN 66X60 STRL (DRAPES) ×3 IMPLANT
DRAPE SHEET LG 3/4 BI-LAMINATE (DRAPES) ×6 IMPLANT
DRAPE SURG 17X11 SM STRL (DRAPES) ×3 IMPLANT
DRAPE TABLE BACK 80X90 (DRAPES) ×3 IMPLANT
DRSG OPSITE POSTOP 4X10 (GAUZE/BANDAGES/DRESSINGS) ×6 IMPLANT
DURAPREP 26ML APPLICATOR (WOUND CARE) ×12 IMPLANT
ELECT BLADE 6.5 EXT (BLADE) ×3 IMPLANT
ELECT CAUTERY BLADE 6.4 (BLADE) ×3 IMPLANT
ELECT REM PT RETURN 9FT ADLT (ELECTROSURGICAL) ×3
ELECTRODE REM PT RTRN 9FT ADLT (ELECTROSURGICAL) ×1 IMPLANT
GAUZE PETRO XEROFOAM 1X8 (MISCELLANEOUS) IMPLANT
GAUZE SPONGE 4X4 12PLY STRL (GAUZE/BANDAGES/DRESSINGS) IMPLANT
GLOVE BIOGEL PI IND STRL 9 (GLOVE) ×3 IMPLANT
GLOVE BIOGEL PI INDICATOR 9 (GLOVE) ×6
GLOVE SKINSENSE NS SZ7.5 (GLOVE) ×4
GLOVE SKINSENSE STRL SZ7.5 (GLOVE) ×2 IMPLANT
GLOVE SURG 9.0 ORTHO LTXF (GLOVE) IMPLANT
GOWN STRL REUS TWL 2XL XL LVL4 (GOWN DISPOSABLE) ×3 IMPLANT
GOWN STRL REUS W/ TWL LRG LVL3 (GOWN DISPOSABLE) ×1 IMPLANT
GOWN STRL REUS W/TWL LRG LVL3 (GOWN DISPOSABLE) ×2
HANDPIECE SUCTION TUBG SURGILV (MISCELLANEOUS) ×3 IMPLANT
HEMOVAC 400ML (MISCELLANEOUS)
HIP CAPITATED HEMI 1 ×1 IMPLANT
KIT DRAIN HEMOVAC JP 7FR 400ML (MISCELLANEOUS) IMPLANT
KIT RM TURNOVER STRD PROC AR (KITS) ×3 IMPLANT
NDL SAFETY 18GX1.5 (NEEDLE) ×3 IMPLANT
NEEDLE FILTER BLUNT 18X 1/2SAF (NEEDLE) ×2
NEEDLE FILTER BLUNT 18X1 1/2 (NEEDLE) ×1 IMPLANT
NEEDLE MAYO CATGUT SZ4 (NEEDLE) ×3 IMPLANT
NS IRRIG 1000ML POUR BTL (IV SOLUTION) ×3 IMPLANT
PACK HIP PROSTHESIS (MISCELLANEOUS) ×3 IMPLANT
PILLOW ABDUC SM (MISCELLANEOUS) ×3 IMPLANT
RETRIEVER SUT HEWSON (MISCELLANEOUS) IMPLANT
SOL .9 NS 3000ML IRR  AL (IV SOLUTION) ×2
SOL .9 NS 3000ML IRR UROMATIC (IV SOLUTION) ×1 IMPLANT
STAPLER SKIN PROX 35W (STAPLE) ×3 IMPLANT
SUT MNCRL 3 0 RB1 (SUTURE) ×1 IMPLANT
SUT MONOCRYL 3 0 RB1 (SUTURE) ×2
SUT TICRON 2-0 30IN 311381 (SUTURE) ×12 IMPLANT
SUT VIC AB 0 CT1 36 (SUTURE) ×3 IMPLANT
SUT VIC AB 2-0 CT2 27 (SUTURE) ×6 IMPLANT
SYRINGE 10CC LL (SYRINGE) ×3 IMPLANT
TAPE MICROFOAM 4IN (TAPE) IMPLANT

## 2015-11-06 NOTE — Anesthesia Postprocedure Evaluation (Signed)
Anesthesia Post Note  Patient: [name]  Procedure(s) Performed: Procedure(s) (LRB): ARTHROPLASTY BIPOLAR HIP (HEMIARTHROPLASTY) (Right)  Patient location during evaluation: PACU Anesthesia Type: General Level of consciousness: awake and alert Pain management: pain level controlled Vital Signs Assessment: post-procedure vital signs reviewed and stable Respiratory status: spontaneous breathing, nonlabored ventilation, respiratory function stable and patient connected to nasal cannula oxygen Cardiovascular status: blood pressure returned to baseline and stable Postop Assessment: no signs of nausea or vomiting Anesthetic complications: no    Last Vitals:  Filed Vitals:   11/06/15 1245 11/06/15 1257  BP: 124/59   Pulse: 62   Temp:  36.3 C  Resp: 18     Last Pain:  Filed Vitals:   11/06/15 1258  PainSc: 0-No pain                 Precious Haws Hattye Siegfried

## 2015-11-06 NOTE — Progress Notes (Signed)
[facility] [facility] [place] NAME: [name]    MR#:  [id number]  DATE OF BIRTH:  Oct 17, 1935  SUBJECTIVE:  Doing well post opjust got back from surgery  REVIEW OF SYSTEMS:  CONSTITUTIONAL: No fever, Has generalized weakness.  EYES: No blurred or double vision.  EARS, NOSE, AND THROAT: No tinnitus or ear pain.  RESPIRATORY: No cough, shortness of breath, wheezing or hemoptysis.  CARDIOVASCULAR: No chest pain, orthopnea, edema.  GASTROINTESTINAL: No nausea, vomiting, diarrhea or abdominal pain.  GENITOURINARY: No dysuria, hematuria.  ENDOCRINE: No polyuria, nocturia,  HEMATOLOGY: No anemia, easy bruising or bleeding SKIN: No rash or lesion. MUSCULOSKELETAL: Right hip pain mild.   NEUROLOGIC: No tingling, numbness, weakness.  PSYCHIATRY: No anxiety or depression.   DRUG ALLERGIES:   Allergies  Allergen Reactions  . Codeine Nausea And Vomiting  . Penicillins Hives    Has patient had a PCN reaction causing immediate rash, facial/tongue/throat swelling, SOB or lightheadedness with hypotension: no  Has patient had a PCN reaction causing severe rash involving mucus membranes or skin necrosis: no  Has patient had a PCN reaction that required hospitalization: no  Has patient had a PCN reaction occurring within the last 10 years: no  If all of the above answers are "NO", then may proceed with Cephalosporin use.   . Sulfa Antibiotics   . Doxycycline Rash  . Erythromycin Rash  . Latex Rash    VITALS:  Blood pressure 142/56, pulse 64, temperature 97.3 F (36.3 C), temperature source Tympanic, resp. rate 16, height 5\' 4"  (1.626 m), weight 51.71 kg (114 lb), SpO2 100 %.  PHYSICAL EXAMINATION:  GENERAL:  80 y.o.-year-old patient lying in the bed with no acute distress.  EYES: Pupils equal, round, reactive to light and accommodation. No scleral icterus. Extraocular muscles intact.  HEENT: Head atraumatic, normocephalic. Oropharynx and  nasopharynx clear.  NECK:  Supple, no jugular venous distention. No thyroid enlargement, no tenderness.  LUNGS: Normal breath sounds bilaterally, no wheezing, rales,rhonchi or crepitation. No use of accessory muscles of respiration.  CARDIOVASCULAR: S1, S2 normal. No murmurs, rubs, or gallops.  ABDOMEN: Soft, nontender, nondistended. Bowel sounds present. No organomegaly or mass.  EXTREMITIES: No pedal edema, cyanosis, or clubbing. She has leg brace placed NEUROLOGIC: Cranial nerves II through XII are intact. Muscle strength 5/5 in all extremities except right leg. Sensation intact. Gait not checked.  PSYCHIATRIC: The patient is alert and oriented x 3.  SKIN: she has healed heel ulcer and some skin tears LE   LABORATORY PANEL:   CBC  Recent Labs Lab 11/06/15 0413  WBC 11.7*  HGB 10.6*  HCT 32.7*  PLT 204   ------------------------------------------------------------------------------------------------------------------  Chemistries   Recent Labs Lab 11/04/15 1204 11/05/15 0655  NA 138 136  K 5.0 4.1  CL 100* 100*  CO2 31 29  GLUCOSE 191* 194*  BUN 21* 18  CREATININE 0.86 0.59  CALCIUM 9.7 8.7*  MG 1.8  --   AST 29  --   ALT 22  --   ALKPHOS 102  --   BILITOT 0.5  --    ------------------------------------------------------------------------------------------------------------------  Cardiac Enzymes  Recent Labs Lab 11/04/15 1204  TROPONINI <0.03   ------------------------------------------------------------------------------------------------------------------  RADIOLOGY:  Dg Hip Port Unilat With Pelvis 1v Right  11/06/2015  CLINICAL DATA:  Status post right hemiarthroplasty. EXAM: DG HIP (WITH OR WITHOUT PELVIS) 1V PORT RIGHT COMPARISON:  Nov 04, 2015. FINDINGS: Right hip prosthesis appears to be well position. Expected  postoperative findings are seen in the surrounding soft tissues. No fracture or dislocation is noted. Previously placed left hip prosthesis  is again noted. IMPRESSION: Status post right hemiarthroplasty. Electronically Signed   By: Marijo Conception, M.D.   On: 11/06/2015 12:42    EKG:   Orders placed or performed during the hospital encounter of 11/04/15  . EKG 12-Lead  . EKG 12-Lead  . ED EKG  . ED EKG    ASSESSMENT AND PLAN:   [name] is a 80 y.o. female with a known history of Multiple medical problems including diabetes mellitus, COPD not on home oxygen, SLE, Rayna[place], rheumatic fever, hypertension, peptic ulcer disease, paroxysmal atrial fibrillation on xarelto, recent admission for fall and left hip fracture 6 months ago admitted after a fall and right femoral neck fracture.  #1 right femoral neck fracture- due to mechanical fall.  POD #0  Restart XARELTO as per ORTHO Management as per ORTHO  #2 paroxysmal atrial fibrillation- on amiodarone. Now in NSR Due to her falls history- not a good candidate for anticoagulation- will need to check with her cardiologist Restart xarelto as per ORTHO Continue Metoprolol for HR control  #3 hyperkalemia. Improved.  #4 multiple falls and memory loss- seeing Dr. Melrose Nakayama. She could not tolerated Namenda They are awaiting second opinion from [facility] neurology Physical therapy consult tomorrow  #5 diabetes mellitus- Restart tradjenta in am and continue SSI on clear liquid diet today. Hold metformin until discharge    Management plans discussed with the patient and family and they are in agreement  CODE STATUS: Full code  TOTAL TIME TAKING CARE OF THIS PATIENT: 29 minutes.  Greater than 50% time was spent on coordination of care and face-to-face counseling.  POSSIBLE D/C IN 2 DAYS, DEPENDING ON CLINICAL CONDITION.   [name] M.D on 11/06/2015 at 1:21 PM  Between 7am to 6pm - Pager - [phone]  After 6pm go to [email or web address] - password [email or web address] [facility]  Office  [phone]  CC: Primary care physician; Halina Maidens, MD

## 2015-11-06 NOTE — Transfer of Care (Signed)
Immediate Anesthesia Transfer of Care Note  Patient: [name]  Procedure(s) Performed: Procedure(s): ARTHROPLASTY BIPOLAR HIP (HEMIARTHROPLASTY) (Right)  Patient Location: PACU  Anesthesia Type:General  Level of Consciousness: sedated  Airway & Oxygen Therapy: Patient Spontanous Breathing and Patient connected to face mask oxygen  Post-op Assessment: Report given to RN and Post -op Vital signs reviewed and stable  Post vital signs: Reviewed and stable  Last Vitals:  Filed Vitals:   11/06/15 0409 11/06/15 0744  BP: 125/98 137/59  Pulse: 67 65  Temp: 36.9 C 36.8 C  Resp: 17 18    Last Pain:  Filed Vitals:   11/06/15 0745  PainSc: 0-No pain      Patients Stated Pain Goal: 0 ([id number])  Complications: No apparent anesthesia complications

## 2015-11-06 NOTE — Anesthesia Preprocedure Evaluation (Addendum)
Anesthesia Evaluation  Patient identified by MRN, date of birth, ID band Patient confused    Reviewed: Allergy & Precautions, H&P , NPO status , Patient's Chart, lab work & pertinent test results  History of Anesthesia Complications Negative for: history of anesthetic complications  Airway Mallampati: III  TM Distance: <3 FB Neck ROM: limited    Dental  (+) Poor Dentition, Chipped, Missing, Partial Lower, Partial Upper   Pulmonary shortness of breath and with exertion, asthma , COPD,  COPD inhaler and oxygen dependent, former smoker,    Pulmonary exam normal breath sounds clear to auscultation       Cardiovascular Exercise Tolerance: Poor (-) angina+ Peripheral Vascular Disease, +CHF and + DOE  (-) Past MI Normal cardiovascular exam+ dysrhythmias Atrial Fibrillation  Rhythm:regular Rate:Normal     Neuro/Psych  Headaches, PSYCHIATRIC DISORDERS Anxiety    GI/Hepatic Neg liver ROS, PUD, GERD  Controlled,  Endo/Other  diabetes, Type 2Hypothyroidism   Renal/GU Renal disease  negative genitourinary   Musculoskeletal  (+) Arthritis ,   Abdominal   Peds  Hematology negative hematology ROS (+)   Anesthesia Other Findings Past Medical History:   DM (diabetes mellitus) ([place])                                 Mitral valve prolapse                                          Comment:a. Not noted on 03/2015 Echo.   COPD (chronic obstructive pulmonary disease) (*              Palpitations                                                 Osteoarthritis                                               Systemic lupus erythematosus (HCC)                           Lumbar spondylolysis                                         Raynaud's disease                                            Anxiety                                                      IBS (irritable bowel syndrome)  Uveitis, anterior                                               Comment:????   Rheumatic fever                                              Ankylosing spondylitis (HCC)                                   Comment:? how diagnosed   Insomnia, unspecified                                        Chronic peptic ulcer, unspecified site, withou*              History of kidney stones                                     Chronic kidney infection                                     Asthma                                                         Comment:as child   Hypothyroidism                                                 Comment:mass, U/S 6/21 - nodules   Motion sickness                                                Comment:car - back seat   GERD (gastroesophageal reflux disease)                       Wears dentures                                                 Comment:full upper, partial lower   Wears contact lenses                                         COPD (chronic obstructive pulmonary disease) (*                Comment:a.Uses 2l prn -  followed by Dr. Raul Del.   Raynaud disease                                              Headache                                                     Neuropathy (HCC)                                             Wheezing                                                     Chronic diastolic CHF (congestive heart failur*                Comment:a. 03/2015 Echo: EF 55-60%, Gr 1 DD.   PAF (paroxysmal atrial fibrillation) ([place])                     Comment:a. 03/2015 in setting of hip Fx->Amio/Xarelo;                b. CHA2DS2VASc= 4.   Left displaced femoral neck fracture ([place])                     Comment:a. 03/2015 s/p L hemiarthroplasty.   Diabetes mellitus ([place])                                     Past Surgical History:   ESOPHAGOGASTRODUODENOSCOPY                       multiple     COLONOSCOPY                                      multiple     TOTAL ABDOMINAL HYSTERECTOMY W/ BILATERAL SALP*   1990         BREAST BIOPSY                                                 BIOPSY THYROID                                                ESOPHAGOGASTRODUODENOSCOPY (EGD) WITH PROPOFOL  N/A 12/11/2014       Comment:Procedure: ESOPHAGOGASTRODUODENOSCOPY (EGD)               WITH PROPOFOL;  Surgeon: [name], MD;  Location: [facility];  Service:               Endoscopy;  Laterality: N/A;  cytology brushing   CATARACT EXTRACTION W/PHACO                     Left 03/23/2015     Comment:Procedure: CATARACT EXTRACTION PHACO AND               INTRAOCULAR LENS PLACEMENT (IOC);  Surgeon:               Birder Robson, MD;  Location: [facility];                Service: Ophthalmology;  Laterality: Left;  [place]               00:51    HIP ARTHROPLASTY                                Left 03/26/2015     Comment:Procedure: ARTHROPLASTY BIPOLAR HIP               (HEMIARTHROPLASTY);  Surgeon: Earnestine Leys,               MD;  Location: [facility];  Service: Orthopedics;              Laterality: Left;   CATARACT EXTRACTION W/PHACO                     Right 05/25/2015     Comment:Procedure: CATARACT EXTRACTION PHACO AND               INTRAOCULAR LENS PLACEMENT (IOC);  Surgeon:               Birder Robson, MD;  Location: [facility];                Service: Ophthalmology;  Laterality: Right;  [place]              00:57    JOINT REPLACEMENT                               Left             BMI    Body Mass Index   19.55 kg/m 2    Patient reports no problems with oxycodone  Reproductive/Obstetrics negative OB ROS                           Anesthesia Physical Anesthesia Plan  ASA: III  Anesthesia Plan: General ETT   Post-op Pain Management:    Induction:   Airway Management Planned:   Additional Equipment:   Intra-op Plan:   Post-operative Plan:   Informed Consent: I have reviewed the patients History and Physical, chart, labs and discussed the procedure  including the risks, benefits and alternatives for the proposed anesthesia with the patient or authorized representative who has indicated his/her understanding and acceptance.   Dental Advisory Given  Plan Discussed with: Anesthesiologist, CRNA and Surgeon  Anesthesia Plan Comments: (Patient somewhat sedated, patient and family members consented together  Patient is only 48 hours out from last rivaroxaban dose so plan for GA  Patient informed that they are higher risk for complications from anesthesia during this procedure due to their medical history including but not  limited to prolonged intubation.  Patient voiced understanding. )      Anesthesia Quick Evaluation

## 2015-11-06 NOTE — Progress Notes (Signed)
Subjective:  POST-OP CHECK:  Patient is doing well postop. She is back in her room and her family is at the bedside. She states that her pain is controlled currently. She has no complaints.  Objective:   VITALS:   Filed Vitals:   11/06/15 1243 11/06/15 1245 11/06/15 1257 11/06/15 1311  BP: 141/57 124/59  142/56  Pulse: 68 62  64  Temp:   97.3 F (36.3 C)   TempSrc:      Resp: 12 18  16   Height:      Weight:      SpO2: 92% 100%  100%    PHYSICAL EXAM:  Right lower extremity: Patient's dressing is clean dry and intact. She has intact sensation to light touch throughout the right lower extremity. She has palpable pedal pulses and can flex and extend her toes and dorsiflex and plantarflex her ankle. Patient has been noted to have a posterior heel ulcer on the right side. She also has some skin irritation over the anterior right ankle.   LABS  Results for orders placed or performed during the hospital encounter of 11/04/15 (from the past 24 hour(s))  Glucose, capillary     Status: Abnormal   Collection Time: 11/05/15  4:53 PM  Result Value Ref Range   Glucose-Capillary 227 (H) 65 - 99 mg/dL   Comment 1 Notify RN   Glucose, capillary     Status: Abnormal   Collection Time: 11/05/15  9:41 PM  Result Value Ref Range   Glucose-Capillary 150 (H) 65 - 99 mg/dL  CBC     Status: Abnormal   Collection Time: 11/06/15  4:13 AM  Result Value Ref Range   WBC 11.7 (H) 3.6 - 11.0 K/uL   RBC 3.70 (L) 3.80 - 5.20 MIL/uL   Hemoglobin 10.6 (L) 12.0 - 16.0 g/dL   HCT 32.7 (L) 35.0 - 47.0 %   MCV 88.3 80.0 - 100.0 fL   MCH 28.8 26.0 - 34.0 pg   MCHC 32.6 32.0 - 36.0 g/dL   RDW 15.0 (H) 11.5 - 14.5 %   Platelets 204 150 - 440 K/uL  Glucose, capillary     Status: Abnormal   Collection Time: 11/06/15  7:47 AM  Result Value Ref Range   Glucose-Capillary 157 (H) 65 - 99 mg/dL   Comment 1 Notify RN     Dg Hip Port Unilat With Pelvis 1v Right  11/06/2015  CLINICAL DATA:  Status post right  hemiarthroplasty. EXAM: DG HIP (WITH OR WITHOUT PELVIS) 1V PORT RIGHT COMPARISON:  Nov 04, 2015. FINDINGS: Right hip prosthesis appears to be well position. Expected postoperative findings are seen in the surrounding soft tissues. No fracture or dislocation is noted. Previously placed left hip prosthesis is again noted. IMPRESSION: Status post right hemiarthroplasty. Electronically Signed   By: Marijo Conception, M.D.   On: 11/06/2015 12:42    Assessment/Plan: Day of Surgery   Active Problems:   Hip fracture [facility])   Patient is doing well postop. She will restart her Xarelto tomorrow. Patient is ordered for her heel boots and the wound consult for her right foot and ankle. She'll be partial weightbearing on the right lower extremity and will observe posterior hip precautions. Physical and occupational therapy will start tomorrow. Labs will be redrawn in the morning. Patient will have her Foley catheter removed tomorrow. She should use her incentive spirometry every hour while awake. She will complete 24 hours postop antibiotics.   Thornton Park , MD 11/06/2015, 1:35  PM       

## 2015-11-06 NOTE — Anesthesia Procedure Notes (Signed)
Procedure Name: Intubation Date/Time: 11/06/2015 9:42 AM Performed by: Johnna Acosta Pre-anesthesia Checklist: Patient identified, Emergency Drugs available, Suction available, Patient being monitored and Timeout performed Patient Re-evaluated:Patient Re-evaluated prior to inductionOxygen Delivery Method: Circle system utilized Preoxygenation: Pre-oxygenation with 100% oxygen Intubation Type: IV induction Ventilation: Mask ventilation without difficulty Laryngoscope Size: McGraph and 3 Grade View: Grade I Tube type: Oral Tube size: 7.0 mm Number of attempts: 1 Airway Equipment and Method: Stylet and Video-laryngoscopy Placement Confirmation: ETT inserted through vocal cords under direct vision,  positive ETCO2 and breath sounds checked- equal and bilateral Secured at: 21 cm Tube secured with: Tape Dental Injury: Teeth and Oropharynx as per pre-operative assessment  Difficulty Due To: Difficulty was anticipated and Difficult Airway- due to limited oral opening

## 2015-11-06 NOTE — Op Note (Signed)
11/04/2015 - 11/06/2015  11:36 AM  PATIENT:  [name]   MRN: [id number]  PRE-OPERATIVE DIAGNOSIS:  Right hip fracture  POST-OPERATIVE DIAGNOSIS:  same  PROCEDURE:  Procedure(s): RIGHT ARTHROPLASTY BIPOLAR HIP (HEMIARTHROPLASTY)  PREOPERATIVE INDICATIONS:    [name] is an 80 y.o. female who has a diagnosis of right hip fracture and agreed with surgical management of his fracture.  The risks benefits and alternatives were discussed with the patient and their family including but not limited to the risks of  infection requiring removal of the prosthesis, bleeding requiring blood transfusion, nerve injury especially to the sciatic nerve leading to foot drop or lower extremity numbness, periprosthetic fracture, dislocation leg length discrepancy, change in lower extremity rotation persistent hip pain, loosening or failure of the components and the need for revision surgery. Medical risks include but are not limited to DVT and pulmonary wasn't, myocardial infarction, stroke, pneumonia, respiratory failure and death.  OPERATIVE REPORT     SURGEON:  Thornton Park, MD    ANESTHESIA:  General    COMPLICATIONS:  None.   SPECIMEN: Femoral head to pathology    COMPONENTS:  [employer]ker Accolade HFx 127 degree femoral component size 2 with a Unitrax size 47 mm head component -4 neck adjustment sleeve.    PROCEDURE IN DETAIL:   The patient was met in the holding area and  identified.  The appropriate hip was identified and marked at the operative site after verbally confirming with the patient that this was the correct site of surgery.  The patient was then transported to the OR  and  underwent general anesthesia.  The patient was then placed in the lateral decubitus position with the operative side up and secured on the operating room table with a pegboard and all bony prominences were adequately padded. This included an axillary roll and additional padding around the  nonoperative leg to prevent compression to the common peroneal nerve.    The operative lower extremity was prepped and draped in a sterile fashion.  A time out was performed prior to incision to verify patient's name, date of birth, medical record number, correct site of surgery correct procedure to be performed. Was also used to verify the patient received antibiotics now appropriate instruments, implants and radiographic studies were available in the room. Once all in attendance were in agreement case began.    A posterolateral approach was utilized via sharp dissection  carried down to the subcutaneous tissue.  Bleeding vessels were coagulated using electrocautery.  The fascia lata was identified and incised along the length of the skin incision.  The gluteus maximus muscle was then split in line with its fibers. Self-retaining retractors were  inserted.  With the hip internally rotated, the short external rotators  were identified and removed from the posterior attachment from the greater trochanter. The piriformis was tagged for later repair. The capsule was identified and a T-shaped capsulotomy was performed. The capsule was tagged with #2 Tycron for later repair.  The femoral neck fracture was exposed, and the femoral head was removed using a corkscrew device. This was measured to be 47 mm in diameter. The attention was then turned to proximal femur preparation.  An oscillating saw was used to perform a proximal femoral osteotomy 1 fingerbreadth above the lesser trochanter. The trial 47 mm femoral head was placed into the acetabulum and had an excellent suction fit. The attention was then turned back to femoral preparation.    A femoral skid and Cobra  retractor were placed under the femoral neck to allow for adequate visualization. A box osteotome was used to make the initial entry into the proximal femur. A single hand reamer was used to prepare the femoral canal. A T-shaped femoral canal sounder was  then used to ensure no penetration femoral cortex had occurred during reaming. The proximal femur was then sequentially broached by hand. A size 2 femoral trial was found to have best medial to lateral canal fit. A calcar planar was used to remove excess bone around the femoral broach. Once adequate mediolateral canal fill was achieved the trial femoral broach, neck, and head was assembled and the hip was reduced. It was found to have excellent stability, equivalent leg lengths with functional range of motion. The trial components were then removed.  I copiously irrigated the femoral canal and then impacted the real femoral prosthesis into place into the appropriate version, anteverted 15 degrees to approximate the normal anatomy, and I impacted the actual 47 mm  Unitrax femoral component with a -4 neck adjustment sleeve into place. The hip was then reduced and taken through functional range of motion and found to have excellent stability. Leg lengths were restored.   The hip joint was copiously irrigated.   A soft tissue repair of the capsule and piriformis was performed using #2 Tycron Excellent posterior capsular repair was achieved. The fascia lata was then closed with interrupted 0 Vicryl suture. The subcutaneous tissues were closed with 2-0 Vicryl and the skin approximated with staples.   The patient was then placed supine on the operative table. Leg lengths were checked clinically and found to be equivalent. An abduction pillow was placed between the lower extremities. The patient was then transferred to a hospital bed and brought to the PACU in stable condition. I was scrubbed and present the entire case and all sharp and instrument counts were correct at the conclusion of the case. I spoke with the patient's family in the postop consultation room to let the case was completed without complication patient was stable in recovery room.   Timoteo Gaul, MD Orthopedic Surgeon

## 2015-11-06 NOTE — Progress Notes (Signed)
Patient alert x2.NPO since midnight. Foam pad intact on sacrum. Pain controlled with PRN medication. No acute distress noted. Resting between care.

## 2015-11-07 LAB — BASIC METABOLIC PANEL
ANION GAP: 8 (ref 5–15)
BUN: 14 mg/dL (ref 6–20)
CHLORIDE: 101 mmol/L (ref 101–111)
CO2: 27 mmol/L (ref 22–32)
Calcium: 8.8 mg/dL — ABNORMAL LOW (ref 8.9–10.3)
Creatinine, Ser: 0.61 mg/dL (ref 0.44–1.00)
GFR calc Af Amer: >60 mL/min (ref >60)
GFR calc non Af Amer: >60 mL/min (ref >60)
GLUCOSE: 195 mg/dL — AB (ref 65–99)
POTASSIUM: 3.8 mmol/L (ref 3.5–5.1)
Sodium: 136 mmol/L (ref 135–145)

## 2015-11-07 LAB — GLUCOSE, CAPILLARY
GLUCOSE-CAPILLARY: 206 mg/dL — AB (ref 65–99)
Glucose-Capillary: 231 mg/dL — ABNORMAL HIGH (ref 65–99)

## 2015-11-07 LAB — URINE CULTURE

## 2015-11-07 LAB — CBC
HEMATOCRIT: 32.3 % — AB (ref 35.0–47.0)
HEMOGLOBIN: 10.4 g/dL — AB (ref 12.0–16.0)
MCH: 28.6 pg (ref 26.0–34.0)
MCHC: 32.2 g/dL (ref 32.0–36.0)
MCV: 88.8 fL (ref 80.0–100.0)
Platelets: 215 10*3/uL (ref 150–440)
RBC: 3.64 MIL/uL — AB (ref 3.80–5.20)
RDW: 14.9 % — ABNORMAL HIGH (ref 11.5–14.5)
WBC: 10.9 10*3/uL (ref 3.6–11.0)

## 2015-11-07 MED ORDER — LORAZEPAM 1 MG PO TABS
1.0000 mg | ORAL_TABLET | Freq: Every day | ORAL | Status: DC
  Administered 2015-11-07 – 2015-11-09 (×3): 1 mg via ORAL
  Filled 2015-11-07 (×3): qty 1

## 2015-11-07 MED ORDER — LORAZEPAM 1 MG PO TABS
1.0000 mg | ORAL_TABLET | Freq: Four times a day (QID) | ORAL | Status: DC | PRN
  Administered 2015-11-07 – 2015-11-09 (×2): 1 mg via ORAL
  Filled 2015-11-07 (×2): qty 1

## 2015-11-07 MED ORDER — LORAZEPAM 2 MG/ML IJ SOLN
0.5000 mg | Freq: Once | INTRAMUSCULAR | Status: AC
  Administered 2015-11-07: 0.5 mg via INTRAMUSCULAR

## 2015-11-07 MED ORDER — RIVAROXABAN 15 MG PO TABS
15.0000 mg | ORAL_TABLET | Freq: Every day | ORAL | Status: DC
  Administered 2015-11-08 – 2015-11-09 (×2): 15 mg via ORAL
  Filled 2015-11-07 (×2): qty 1

## 2015-11-07 MED ORDER — LORAZEPAM 2 MG/ML IJ SOLN
0.5000 mg | Freq: Once | INTRAMUSCULAR | Status: AC
  Administered 2015-11-08: 0.5 mg via INTRAVENOUS
  Filled 2015-11-07: qty 1

## 2015-11-07 MED ORDER — LORAZEPAM 2 MG/ML IJ SOLN
0.5000 mg | Freq: Once | INTRAMUSCULAR | Status: AC
  Administered 2015-11-08: 0.5 mg via INTRAMUSCULAR
  Filled 2015-11-07: qty 1

## 2015-11-07 NOTE — Progress Notes (Signed)
[place] [facility] [place] NAME: [name]    MR#:  [id number]  DATE OF BIRTH:  [birth year]  SUBJECTIVE:  Patient agitated and confused.  REVIEW OF SYSTEMS:  Unable to abtain because of confusion  DRUG ALLERGIES:   Allergies  Allergen Reactions  . Codeine Nausea And Vomiting  . Penicillins Hives    Has patient had a PCN reaction causing immediate rash, facial/tongue/throat swelling, SOB or lightheadedness with hypotension: no  Has patient had a PCN reaction causing severe rash involving mucus membranes or skin necrosis: no  Has patient had a PCN reaction that required hospitalization: no  Has patient had a PCN reaction occurring within the last 10 years: no  If all of the above answers are "NO", then may proceed with Cephalosporin use.   . Sulfa Antibiotics   . Doxycycline Rash  . Erythromycin Rash  . Latex Rash    VITALS:  Blood pressure 126/42, pulse 68, temperature 97.8 F (36.6 C), temperature source Oral, resp. rate 18, height 5\' 4"  (1.626 m), weight 51.71 kg (114 lb), SpO2 90 %.  PHYSICAL EXAMINATION:  GENERAL: Patient is alert but agitated. Does not appear to be in any other physical distress. Unable to perform the rest of physical exam because of agitation.   LABORATORY PANEL:   CBC  Recent Labs Lab 11/07/15 0416  WBC 10.9  HGB 10.4*  HCT 32.3*  PLT 215   ------------------------------------------------------------------------------------------------------------------  Chemistries   Recent Labs Lab 11/04/15 1204  11/07/15 0416  NA 138  < > 136  K 5.0  < > 3.8  CL 100*  < > 101  CO2 31  < > 27  GLUCOSE 191*  < > 195*  BUN 21*  < > 14  CREATININE 0.86  < > 0.61  CALCIUM 9.7  < > 8.8*  MG 1.8  --   --   AST 29  --   --   ALT 22  --   --   ALKPHOS 102  --   --   BILITOT 0.5  --   --   < > = values in this interval not  displayed. ------------------------------------------------------------------------------------------------------------------  Cardiac Enzymes  Recent Labs Lab 11/04/15 1204  TROPONINI <0.03   ------------------------------------------------------------------------------------------------------------------  RADIOLOGY:  Dg Hip Port Unilat With Pelvis 1v Right  11/06/2015  CLINICAL DATA:  Status post right hemiarthroplasty. EXAM: DG HIP (WITH OR WITHOUT PELVIS) 1V PORT RIGHT COMPARISON:  Nov 04, 2015. FINDINGS: Right hip prosthesis appears to be well position. Expected postoperative findings are seen in the surrounding soft tissues. No fracture or dislocation is noted. Previously placed left hip prosthesis is again noted. IMPRESSION: Status post right hemiarthroplasty. Electronically Signed   By: Marijo Conception, M.D.   On: 11/06/2015 12:42    EKG:   Orders placed or performed during the hospital encounter of 11/04/15  . EKG 12-Lead  . EKG 12-Lead  . ED EKG  . ED EKG    ASSESSMENT AND PLAN:    #1 right femoral neck fracture- due to mechanical fall.  POD #1 Management as per ORTHO  #2 AMS: Found out from daughter she has been on nightly ativan for yrs. Has not had ativan in about 48 hrs. Suspect she maybe withdrawing some. Will order scheduled ativan at night and prn during the day.  #2 paroxysmal atrial fibrillation- on amiodarone. Now in NSR Restart xeralto  #3 hyperkalemia.Resolved.  #4 multiple falls and memory loss- seeing  Dr. Melrose Nakayama. She could not tolerated Namenda They are awaiting second opinion from [facility] neurology Physical therapy consult tomorrow  #5 diabetes mellitus- Restart tradjenta in am and continue SSI on clear liquid diet today. Hold metformin until discharge    Management plans discussed with the patient and family and they are in agreement  CODE STATUS: Full code  TOTAL TIME TAKING CARE OF THIS PATIENT:66minutes.  Greater than 50% time was spent  on coordination of care and face-to-face counseling.  POSSIBLE D/C IN 2 DAYS, DEPENDING ON CLINICAL CONDITION.   Dana Allan.D on 11/07/2015 at 2:05 PM  Between 7am to 6pm - Pager - [phone]  After 6pm go to [email or web address] - password [email or web address] [facility]  Office  [phone]  CC: Primary care physician; Halina Maidens, MD

## 2015-11-07 NOTE — Progress Notes (Signed)
Subjective:  Postoperative day #1 status post right hip hemiarthroplasty. Patient is up out of bed to a chair. Patient reports pain as mild.  Her husband is at the bedside. Patient appears more confused today.  Objective:   VITALS:   Filed Vitals:   11/06/15 1837 11/06/15 1944 11/07/15 0344 11/07/15 0733  BP: 107/73 117/55 151/62 126/42  Pulse: 61 60 65 68  Temp: 98.1 F (36.7 C) 97.6 F (36.4 C) 97.8 F (36.6 C) 97.8 F (36.6 C)  TempSrc: Axillary Oral Oral Oral  Resp: 16 17 18 18   Height:      Weight:      SpO2: 100% 100% 100% 90%    PHYSICAL EXAM:  Gen.: Patient is alert but is confused to time and place.  Right hip: Patient has moderate serosanguineous drainage on her dressing. There is no active drainage seen. She has mild swelling in her right thigh and her thigh compartments are soft and compressible. She has intact sensation throughout the right lower extremity. She has palpable pedal pulses and intact sensation to light touch throughout the right lower extremity. She can flex and extend her toes and dorsiflex and plantarflex her ankle.  LABS  Results for orders placed or performed during the hospital encounter of 11/04/15 (from the past 24 hour(s))  Glucose, capillary     Status: Abnormal   Collection Time: 11/06/15  3:55 PM  Result Value Ref Range   Glucose-Capillary 278 (H) 65 - 99 mg/dL   Comment 1 Notify RN   Glucose, capillary     Status: Abnormal   Collection Time: 11/06/15  9:22 PM  Result Value Ref Range   Glucose-Capillary 284 (H) 65 - 99 mg/dL   Comment 1 Notify RN   CBC     Status: Abnormal   Collection Time: 11/07/15  4:16 AM  Result Value Ref Range   WBC 10.9 3.6 - 11.0 K/uL   RBC 3.64 (L) 3.80 - 5.20 MIL/uL   Hemoglobin 10.4 (L) 12.0 - 16.0 g/dL   HCT 32.3 (L) 35.0 - 47.0 %   MCV 88.8 80.0 - 100.0 fL   MCH 28.6 26.0 - 34.0 pg   MCHC 32.2 32.0 - 36.0 g/dL   RDW 14.9 (H) 11.5 - 14.5 %   Platelets 215 150 - 440 K/uL  Basic metabolic panel      Status: Abnormal   Collection Time: 11/07/15  4:16 AM  Result Value Ref Range   Sodium 136 135 - 145 mmol/L   Potassium 3.8 3.5 - 5.1 mmol/L   Chloride 101 101 - 111 mmol/L   CO2 27 22 - 32 mmol/L   Glucose, Bld 195 (H) 65 - 99 mg/dL   BUN 14 6 - 20 mg/dL   Creatinine, Ser 0.61 0.44 - 1.00 mg/dL   Calcium 8.8 (L) 8.9 - 10.3 mg/dL   GFR calc non Af Amer >60 >60 mL/min   GFR calc Af Amer >60 >60 mL/min   Anion gap 8 5 - 15  Glucose, capillary     Status: Abnormal   Collection Time: 11/07/15 11:12 AM  Result Value Ref Range   Glucose-Capillary 231 (H) 65 - 99 mg/dL   Comment 1 Notify RN     Dg Hip Port Unilat With Pelvis 1v Right  11/06/2015  CLINICAL DATA:  Status post right hemiarthroplasty. EXAM: DG HIP (WITH OR WITHOUT PELVIS) 1V PORT RIGHT COMPARISON:  Nov 04, 2015. FINDINGS: Right hip prosthesis appears to be well position. Expected postoperative findings  are seen in the surrounding soft tissues. No fracture or dislocation is noted. Previously placed left hip prosthesis is again noted. IMPRESSION: Status post right hemiarthroplasty. Electronically Signed   By: Marijo Conception, M.D.   On: 11/06/2015 12:42    Assessment/Plan: 1 Day Post-Op   Active Problems:   Hip fracture ([place])   Patient is stable postop. She has a hemoglobin and hematocrit which are within acceptable limits. No transfusion is required today. I spoke with Dr. Wynetta Emery from the hospital service. Patient will be restarted on her baseline ativan.   Thornton Park , MD 11/07/2015, 2:38 PM

## 2015-11-07 NOTE — Progress Notes (Signed)
Pt unable to void this shift after foley cath removed. Bladder scan showed 261mL urine in bladder. Pt placed on BSC with no results. In/out cath performed with output of 225 mL. Pt continues with confusion, delusions but is less combative and not threatening staff at this time. Family at bedside. Oncoming shift aware, will continue to monitor.

## 2015-11-07 NOTE — Progress Notes (Signed)
Clinical Education officer, museum (CSW) presented bed offers to patient and her daughter Lattie Haw. Daughter chose [facility]. CSW will continue to follow and assist as needed.   Blima Rich, LCSW [phone]

## 2015-11-07 NOTE — Progress Notes (Addendum)
Pt becoming increasingly agitated, stating the family and staff are out to get her, that her husband is having an affair with daughter and nursing statff, and that her appointment was scheduled at the wrong time and she needs to leave. Pt refusing all PO medications. Refused lunchtime meal. Paged MD. Per Dr. Wynetta Emery, order for 0.5mg  Ativan. Pt has removed IV access, IM injection given to Right deltoid with assistance from orderly due to pt becoming combative, threatening staff. Insulin given SQ at that time due to BG of 231.  Daughter at bedside, pt siting in chair. Will continue to monitor.

## 2015-11-07 NOTE — Progress Notes (Signed)
ANTICOAGULATION CONSULT NOTE - Initial Consult  Pharmacy Consult for rivaroxaban  Indication: atrial fibrillation  Allergies  Allergen Reactions  . Codeine Nausea And Vomiting  . Penicillins Hives    Has patient had a PCN reaction causing immediate rash, facial/tongue/throat swelling, SOB or lightheadedness with hypotension: no  Has patient had a PCN reaction causing severe rash involving mucus membranes or skin necrosis: no  Has patient had a PCN reaction that required hospitalization: no  Has patient had a PCN reaction occurring within the last 10 years: no  If all of the above answers are "NO", then may proceed with Cephalosporin use.   . Sulfa Antibiotics   . Doxycycline Rash  . Erythromycin Rash  . Latex Rash    Patient Measurements: Height: 5\' 4"  (162.6 cm) Weight: 114 lb (51.71 kg) IBW/kg (Calculated) : 54.7   Vital Signs: Temp: 97.8 F (36.6 C) (05/28 0733) Temp Source: Oral (05/28 0733) BP: 126/42 mmHg (05/28 0733) Pulse Rate: 68 (05/28 0733)  Labs:  Recent Labs  11/05/15 0655 11/06/15 0413 11/07/15 0416  HGB 11.3* 10.6* 10.4*  HCT 34.8* 32.7* 32.3*  PLT 230 204 215  CREATININE 0.59  --  0.61    Estimated Creatinine Clearance: 48.1 mL/min (by C-G formula based on Cr of 0.61).   Medical History: Past Medical History  Diagnosis Date  . DM (diabetes mellitus) ([place])   . Mitral valve prolapse     a. Not noted on 03/2015 Echo.  Marland Kitchen COPD (chronic obstructive pulmonary disease) ([place])   . Palpitations   . Osteoarthritis   . Systemic lupus erythematosus ([place])   . Lumbar spondylolysis   . Raynaud's disease   . Anxiety   . IBS (irritable bowel syndrome)   . Uveitis, anterior     ????  . Rheumatic fever   . Ankylosing spondylitis ([place])     ? how diagnosed  . Insomnia, unspecified   . Chronic peptic ulcer, unspecified site, without mention of hemorrhage, perforation, or obstruction   . History of kidney stones   . Chronic kidney infection   . Asthma      as child  . Hypothyroidism     mass, U/S 6/21 - nodules  . Motion sickness     car - back seat  . GERD (gastroesophageal reflux disease)   . Wears dentures     full upper, partial lower  . Wears contact lenses   . COPD (chronic obstructive pulmonary disease) ([place])     a.Uses 2l prn - followed by Dr. Raul Del.  . Raynaud disease   . Headache   . Neuropathy ([place])   . Wheezing   . Chronic diastolic CHF (congestive heart failure) ([place])     a. 03/2015 Echo: EF 55-60%, Gr 1 DD.  Marland Kitchen PAF (paroxysmal atrial fibrillation) ([place])     a. 03/2015 in setting of hip Fx->Amio/Xarelo;  b. CHA2DS2VASc= 4.  . Left displaced femoral neck fracture ([place])     a. 03/2015 s/p L hemiarthroplasty.  . Diabetes mellitus ([facility])     Medications:  Scheduled:  . acidophilus  1 capsule Oral Daily  . amiodarone  200 mg Oral Daily  . antiseptic oral rinse  7 mL Mouth Rinse BID  . docusate sodium  100 mg Oral BID  . famotidine  20 mg Oral Daily  . ferrous sulfate  325 mg Oral TID PC  . insulin aspart  0-5 Units Subcutaneous QHS  . insulin aspart  0-9 Units Subcutaneous TID WC  .  linagliptin  5 mg Oral Daily  . LORazepam  0.5 mg Intravenous Once  . LORazepam  0.5 mg Intramuscular Once  . LORazepam  1 mg Oral QHS  . metoprolol tartrate  25 mg Oral BID  . mometasone-formoterol  2 puff Inhalation BID  . [START ON 11/08/2015] rivaroxaban  15 mg Oral Q supper  . senna  1 tablet Oral BID  . tiotropium  18 mcg Inhalation Daily   Infusions:  . sodium chloride Stopped (11/07/15 0430)    Assessment: Pharmacy consulted to restart patient's home dose of Xarelto.  Patient takes Xarelto 20 mg po qsupper as an outpatient.  Patient received Xarelto 10 mg po this morning (ordered for DVT prophylaxis after hip fracture).   Est CrCl~48 mL/min  Goal of Therapy:  Monitor SCr and CBC per policy   Plan:  Will restart patient on Xarelto 15 mg po daily based on CrCl<50 mL/min.  Will order next dose to start on 5/29 as she  received a 10 mg dose today.   Pharmacy will continue to follow.  Murrell Converse, PharmD Clinical Pharmacist 11/07/2015

## 2015-11-07 NOTE — Evaluation (Signed)
Physical Therapy Evaluation Patient Details Name: [name] MRN: [id number] DOB: [birth year] Today's Date: 11/07/2015   History of Present Illness  [name] is a 80 y.o. female with a known history of Multiple medical problems including diabetes mellitus, COPD not on home oxygen, SLE, Raynaud, rheumatic fever, hypertension, peptic ulcer disease, paroxysmal atrial fibrillation on xarelto, recent admission for fall and left hip fracture in October 2016; She is admitted after a fall and right femoral neck fracture. Patient is s/p right hemiarthroplasty on 11/06/15; She is PWB in RLE and has posterior hip precautions;   Clinical Impression  80 yo Female fell at home and sustained a right hip fracture. She is s/p right hemiarthroplasty on 11/06/15; Patient was experiencing increased fall history leading up to recent hospitalization. She was seeing a neurologist regarding impaired balance and impaired memory/cognitive issues. Patient lives at home with her husband. She reports having a home health nurse come 4 days a week, 1/2 day duration to assist with ADLs and light housework. Currently patient is mod A for bed mobility, transfers and gait tasks. She is limited to 3-4 feet of ambulation to bedside chair due to LE weakness, impaired balance. Patient demonstrates significant left sided trunk lean due to increased right hip pain. She would benefit from additional skilled PT intervention to improve LE strength, balance/gait safety. Patient's clinical impairments affecting rehab including, impaired cognition, high fall risk, limited mobility at baseline; Her clinical presentation is evolving as she has a recent right hip fracture, but co-morbidities including impaired memory limit her ability to remember precautions and weight bearing restrictions.     Follow Up Recommendations SNF    Equipment Recommendations  None recommended by PT    Recommendations for Other Services       Precautions /  Restrictions Precautions Precautions: Posterior Hip Precaution Comments: RLE Restrictions Weight Bearing Restrictions: Yes RLE Weight Bearing: Partial weight bearing Other Position/Activity Restrictions: patient on posterior hip precautions for RLE      Mobility  Bed Mobility Overal bed mobility: Needs Assistance Bed Mobility: Supine to Sit     Supine to sit: Mod assist;HOB elevated     General bed mobility comments: with cues for hand placement, using bed rails; required mod A to move RLE and to improve scoot forward to sitting edge of bed;   Transfers Overall transfer level: Needs assistance Equipment used: Rolling walker (2 wheeled) Transfers: Sit to/from Stand Sit to Stand: Mod assist         General transfer comment: With mod VCs for hand placement; also instructed patient to advance RLE forward to adhere to WB restrictions (PWB);   Ambulation/Gait Ambulation/Gait assistance: Mod assist Ambulation Distance (Feet): 3 Feet Assistive device: Rolling walker (2 wheeled) Gait Pattern/deviations: Step-to pattern;Decreased step length - right;Decreased step length - left;Decreased stance time - right;Decreased dorsiflexion - right;Decreased dorsiflexion - left;Shuffle;Narrow base of support;Trunk flexed Gait velocity: decreased   General Gait Details: patient ambulated to bedside chair with short shuffled steps, quick movement, decreased weight shift to RLE; forward flexed posture;   Stairs            Wheelchair Mobility    Modified Rankin (Stroke Patients Only)       Balance Overall balance assessment: Needs assistance Sitting-balance support: Bilateral upper extremity supported Sitting balance-Leahy Scale: Fair Sitting balance - Comments: demonstrates left lean requiring CGA to right self to midline; Postural control: Left lateral lean Standing balance support: Bilateral upper extremity supported Standing balance-Leahy Scale: Fair Standing balance  comment: static standing balance: fair, dynamic standing balance is fair -                             Pertinent Vitals/Pain Pain Assessment: 0-10 Pain Score: 8  Pain Location: right hip Pain Descriptors / Indicators: Aching;Sore;Tender Pain Intervention(s): Limited activity within patient's tolerance;Monitored during session;Repositioned;Ice applied    Home Living Family/patient expects to be discharged to:: Skilled nursing facility Living Arrangements: Spouse/significant other Available Help at Discharge: Family Type of Home: House Home Access: Stairs to enter Entrance Stairs-Rails: None Entrance Stairs-Number of Steps: 1 Home Layout: Two level;Able to live on main level with bedroom/bathroom Home Equipment: Bedside commode;Walker - 2 wheels;Transport chair;Shower seat      Prior Function Level of Independence: Independent with assistive device(s);Needs assistance   Gait / Transfers Assistance Needed: used RW for most gait tasks;   ADL's / Homemaking Assistance Needed: had home health nurse 4 days a week for 1/2 a day to assist with cooking, cleaning and bathing;         Hand Dominance        Extremity/Trunk Assessment   Upper Extremity Assessment: Overall WFL for tasks assessed;Generalized weakness           Lower Extremity Assessment: RLE deficits/detail;LLE deficits/detail RLE Deficits / Details: hip not assessed; knee: 4-/5, ankle 4/5; numbness around right hip but otherwise intact light touch sensation;  LLE Deficits / Details: LLE: hip 4/5, knee 4/5, ankle 4/5; intact light touch sensation;   Cervical / Trunk Assessment: Kyphotic  Communication   Communication: No difficulties  Cognition Arousal/Alertness: Awake/alert Behavior During Therapy: Restless;Anxious Overall Cognitive Status: History of cognitive impairments - at baseline       Memory: Decreased recall of precautions;Decreased short-term memory (oriented to situation, place and  person; not oriented to date, )              General Comments General comments (skin integrity, edema, etc.): minimal swelling noted;     Exercises Other Exercises Other Exercises: Instructed patient in sitting exercise: ankle pumps x10, quad sets and glute squeeze x10 bilaterally with mod VCs to increase ROM and increase hold time; assessed understanding and independence through teach back; Patient required increased cues for correct technique with learning due to poor memory;       Assessment/Plan    PT Assessment Patient needs continued PT services  PT Diagnosis Difficulty walking;Generalized weakness;Acute pain   PT Problem List Decreased strength;Decreased range of motion;Decreased safety awareness;Decreased activity tolerance;Decreased balance;Pain;Decreased knowledge of precautions;Decreased mobility  PT Treatment Interventions DME instruction;Gait training;Stair training;Functional mobility training;Therapeutic activities;Therapeutic exercise;Patient/family education;Neuromuscular re-education;Balance training   PT Goals (Current goals can be found in the Care Plan section) Acute Rehab PT Goals Patient Stated Goal: "I want to be able to care for myself again." PT Goal Formulation: With patient Time For Goal Achievement: 11/21/15 Potential to Achieve Goals: Good    Frequency BID   Barriers to discharge Inaccessible home environment impaired cognition, high fall risk; decreased mobility;     Co-evaluation               End of Session Equipment Utilized During Treatment: Gait belt Activity Tolerance: Patient limited by pain Patient left: in chair;with call bell/phone within reach;with chair alarm set Nurse Communication: Mobility status;Patient requests pain meds         Time: 0912-0937 PT Time Calculation (min) (ACUTE ONLY): 25 min   Charges:   PT Evaluation $  PT Eval Moderate Complexity: 1 Procedure PT Treatments $Therapeutic Exercise: 8-22 mins    PT G Codes:        Mina Carlisi PT, DPT 11/07/2015, 9:54 AM

## 2015-11-07 NOTE — Care Management Important Message (Signed)
Important Message  Patient Details  Name: [name] MRN: [id number] Date of Birth: April 18, 1936   Medicare Important Message Given:  Yes    Aylin Rhoads A, RN 11/07/2015, 12:05 PM

## 2015-11-08 LAB — CBC
HCT: 27.2 % — ABNORMAL LOW (ref 35.0–47.0)
Hemoglobin: 9.2 g/dL — ABNORMAL LOW (ref 12.0–16.0)
MCH: 29.3 pg (ref 26.0–34.0)
MCHC: 33.8 g/dL (ref 32.0–36.0)
MCV: 86.5 fL (ref 80.0–100.0)
PLATELETS: 197 10*3/uL (ref 150–440)
RBC: 3.14 MIL/uL — AB (ref 3.80–5.20)
RDW: 15.2 % — AB (ref 11.5–14.5)
WBC: 7.4 10*3/uL (ref 3.6–11.0)

## 2015-11-08 LAB — BASIC METABOLIC PANEL
Anion gap: 4 — ABNORMAL LOW (ref 5–15)
BUN: 13 mg/dL (ref 6–20)
CO2: 33 mmol/L — ABNORMAL HIGH (ref 22–32)
Calcium: 8.3 mg/dL — ABNORMAL LOW (ref 8.9–10.3)
Chloride: 102 mmol/L (ref 101–111)
Creatinine, Ser: 0.65 mg/dL (ref 0.44–1.00)
GFR calc Af Amer: >60 mL/min (ref >60)
GLUCOSE: 203 mg/dL — AB (ref 65–99)
POTASSIUM: 3.8 mmol/L (ref 3.5–5.1)
Sodium: 139 mmol/L (ref 135–145)

## 2015-11-08 LAB — GLUCOSE, CAPILLARY
GLUCOSE-CAPILLARY: 212 mg/dL — AB (ref 65–99)
Glucose-Capillary: 177 mg/dL — ABNORMAL HIGH (ref 65–99)
Glucose-Capillary: 194 mg/dL — ABNORMAL HIGH (ref 65–99)
Glucose-Capillary: 151 mg/dL — ABNORMAL HIGH (ref 65–99)

## 2015-11-08 MED ORDER — BISACODYL 5 MG PO TBEC
5.0000 mg | DELAYED_RELEASE_TABLET | Freq: Once | ORAL | Status: AC
  Administered 2015-11-08: 5 mg via ORAL
  Filled 2015-11-08: qty 1

## 2015-11-08 MED ORDER — ENSURE ENLIVE PO LIQD
237.0000 mL | Freq: Two times a day (BID) | ORAL | Status: DC
  Administered 2015-11-08 – 2015-11-10 (×4): 237 mL via ORAL

## 2015-11-08 NOTE — Progress Notes (Signed)
Physical Therapy Treatment Patient Details Name: [name] MRN: [id number] DOB: 06/09/1936 Today's Date: 11/08/2015    History of Present Illness [name] is a 80 y.o. female with a known history of Multiple medical problems including diabetes mellitus, COPD not on home oxygen, SLE, Raynaud, rheumatic fever, hypertension, peptic ulcer disease, paroxysmal atrial fibrillation on xarelto, recent admission for fall and left hip fracture in October 2016; She is admitted after a fall and right femoral neck fracture. Patient is s/p right hemiarthroplasty on 11/06/15; She is PWB in RLE and has posterior hip precautions;     PT Comments    Patient is progressing with therapy, however progress is slow. She was very lethargic and confused today; She was able to take a few steps which was improvement from yesterday. Patient does require increased assistance and frequent cues verbal and tactile to improve gait ability; Patient had difficulty participating in LE strengthening due to fatigue. She would benefit from additional skilled PT intervention to improve balance/gait safety and reduce fall risk;   Follow Up Recommendations  SNF     Equipment Recommendations  None recommended by PT    Recommendations for Other Services       Precautions / Restrictions Precautions Precautions: Posterior Hip Precaution Comments: RLE Restrictions Weight Bearing Restrictions: Yes RLE Weight Bearing: Partial weight bearing Other Position/Activity Restrictions: patient on posterior hip precautions for RLE    Mobility  Bed Mobility Overal bed mobility: Needs Assistance Bed Mobility: Sit to Supine       Sit to supine: Mod assist   General bed mobility comments: with cues for hand placement, using bed rails; required mod A to lift LE into bed; max A for repositioning in bed;   Transfers Overall transfer level: Needs assistance Equipment used: Rolling walker (2 wheeled) Transfers: Sit  to/from Stand Sit to Stand: Mod assist         General transfer comment: With mod VCs for hand placement; also instructed patient to advance RLE forward to adhere to WB restrictions (PWB) x2 reps;   Ambulation/Gait Ambulation/Gait assistance: Max assist Ambulation Distance (Feet): 5 Feet Assistive device: Rolling walker (2 wheeled) Gait Pattern/deviations: Step-through pattern;Step-to pattern;Decreased weight shift to right;Wide base of support;Shuffle;Trunk flexed Gait velocity: decreased Gait velocity interpretation: Below normal speed for age/gender General Gait Details: Patient ambulated 5 feet forward with RW, max A with verbal and tactile cues to move LE; Patient had difficulty initiating gait which husband reports is her baseline. She also ambulated stand pivot to bed, max A with max VCs and tactile cues for foot placement and walker position for safety;    Stairs            Wheelchair Mobility    Modified Rankin (Stroke Patients Only)       Balance   Sitting-balance support: Bilateral upper extremity supported Sitting balance-Leahy Scale: Fair Sitting balance - Comments: demonstrates left lean requiring CGA to right self to midline; Postural control: Left lateral lean Standing balance support: Bilateral upper extremity supported Standing balance-Leahy Scale: Poor Standing balance comment: requires mod A to keep balance is standing with RW; and is max A for gait tasks;                     Cognition Arousal/Alertness: Lethargic Behavior During Therapy: Flat affect Overall Cognitive Status: Difficult to assess (patient very confused; she has history of memory problems, but this may be more severe than at baseline; )  Memory: Decreased recall of precautions;Decreased short-term memory              Exercises Total Joint Exercises Ankle Circles/Pumps: AROM;Both;10 reps;Seated Long Arc Quad: AROM;Both;10 reps;Seated Other Exercises Other  Exercises: Instructed patient in sitting exercise. She was very lethargic and required increased cues for participation; Also required cues verbal and tactile to increase ROM for better strengthening;     General Comments        Pertinent Vitals/Pain Pain Assessment: 0-10 Pain Score: 8  Pain Location: right hip Pain Descriptors / Indicators: Aching;Sore Pain Intervention(s): Limited activity within patient's tolerance;Monitored during session;Repositioned;Patient requesting pain meds-RN notified    Home Living                      Prior Function            PT Goals (current goals can now be found in the care plan section) Acute Rehab PT Goals Patient Stated Goal: "I want to be able to care for myself again." PT Goal Formulation: With patient Time For Goal Achievement: 11/21/15 Potential to Achieve Goals: Good Progress towards PT goals: Progressing toward goals    Frequency  BID    PT Plan Current plan remains appropriate    Co-evaluation             End of Session Equipment Utilized During Treatment: Gait belt Activity Tolerance: Patient limited by pain;Patient limited by fatigue;Patient limited by lethargy Patient left: with call bell/phone within reach;in bed;with bed alarm set;with nursing/sitter in room     Time: [id number] PT Time Calculation (min) (ACUTE ONLY): 29 min  Charges:  $Gait Training: 8-22 mins $Therapeutic Exercise: 8-22 mins                    G Codes:      Trotter,[name] PT, DPT 11/08/2015, 10:07 AM

## 2015-11-08 NOTE — Progress Notes (Signed)
  Subjective:  Postoperative day #2 status post right hip hemiarthroplasty. Patient is sleeping soundly. Her husband is at the bedside. I did not attempt wake the patient.  Objective:   VITALS:   Filed Vitals:   11/07/15 1539 11/07/15 1932 11/08/15 0410 11/08/15 0733  BP: 126/46 162/70 149/59 148/59  Pulse: 81 82 95 72  Temp: 98.5 F (36.9 C) 98 F (36.7 C) 99 F (37.2 C) 99.2 F (37.3 C)  TempSrc: Oral Oral Oral Axillary  Resp: 18 19 19 17   Height:      Weight:      SpO2: 99% 99% 85% 100%    PHYSICAL EXAM:  Right hip: Patient's dressing was changed by me today. She had moderate serosanguineous drainage on the dressing. There is no active drainage from her incision. I changed her bandage to a new honeycomb dressing.  LABS  Results for orders placed or performed during the hospital encounter of 11/04/15 (from the past 24 hour(s))  Glucose, capillary     Status: Abnormal   Collection Time: 11/07/15  3:39 PM  Result Value Ref Range   Glucose-Capillary 206 (H) 65 - 99 mg/dL   Comment 1 Notify RN   CBC     Status: Abnormal   Collection Time: 11/08/15  7:12 AM  Result Value Ref Range   WBC 7.4 3.6 - 11.0 K/uL   RBC 3.14 (L) 3.80 - 5.20 MIL/uL   Hemoglobin 9.2 (L) 12.0 - 16.0 g/dL   HCT 27.2 (L) 35.0 - 47.0 %   MCV 86.5 80.0 - 100.0 fL   MCH 29.3 26.0 - 34.0 pg   MCHC 33.8 32.0 - 36.0 g/dL   RDW 15.2 (H) 11.5 - 14.5 %   Platelets 197 150 - 440 K/uL  Basic metabolic panel     Status: Abnormal   Collection Time: 11/08/15  7:12 AM  Result Value Ref Range   Sodium 139 135 - 145 mmol/L   Potassium 3.8 3.5 - 5.1 mmol/L   Chloride 102 101 - 111 mmol/L   CO2 33 (H) 22 - 32 mmol/L   Glucose, Bld 203 (H) 65 - 99 mg/dL   BUN 13 6 - 20 mg/dL   Creatinine, Ser 0.65 0.44 - 1.00 mg/dL   Calcium 8.3 (L) 8.9 - 10.3 mg/dL   GFR calc non Af Amer >60 >60 mL/min   GFR calc Af Amer >60 >60 mL/min   Anion gap 4 (L) 5 - 15  Glucose, capillary     Status: Abnormal   Collection Time:  11/08/15  7:27 AM  Result Value Ref Range   Glucose-Capillary 177 (H) 65 - 99 mg/dL  Glucose, capillary     Status: Abnormal   Collection Time: 11/08/15 11:23 AM  Result Value Ref Range   Glucose-Capillary 212 (H) 65 - 99 mg/dL    No results found.  Assessment/Plan: 2 Days Post-Op   Active Problems:   Hip fracture ([place])  Patient is stable. Her hemoglobin and hematocrit remained stable postop. Patient will be going to [facility] upon discharge from [facility].  She will remain partial weightbearing on the right lower extremity. She will need a rolling walker for one she stands or ambulate. She will need continued physical and occupational therapy. She should observe posterior hip precautions and follow up with Dr. Mack Guise ([facility])  in 10-14 days following discharge.    Thornton Park , MD 11/08/2015, 1:45 PM

## 2015-11-08 NOTE — Progress Notes (Signed)
OT Cancellation Note  Patient Details Name: [name] MRN: [id number] DOB: 1935-09-28   Cancelled Treatment:    Reason Eval/Treat Not Completed: Patient's level of consciousness. Unable to wake pt for OT eval. PT and spouse report pt has been asleep since receiving medications this am. Will attempt to evaluate at later date/time once pt more appropriate for therapy.  Corky Sox, OTR/L 11/08/2015, 2:45 PM

## 2015-11-08 NOTE — Progress Notes (Signed)
Clinical Education officer, museum (CSW) attempted to call Bill admissions coordinator at [facility] to make him aware of accepted bed offer however he was not in the office today. Plan is for patient to D/C to [facility] tomorrow 11/09/15 pending medical clearance. CSW will continue to follow and assist as needed.   Blima Rich, LCSW [phone]

## 2015-11-08 NOTE — Discharge Instructions (Addendum)
Orthopedic discharge instructions: Patient has had a right hip hemiarthroplasty. She should remain partial weightbearing on the right lower extremity 4 weeks. Patient needs to observe posterior hip precautions. Patient should continue to elevate the right lower extremity and may apply ice to the surgical site prn.  The patient should continue using incentive spirometry every hour while awake. Patient should also continue TED stockings until their orthopedic follow-up.   Patient should continue physical and occupational therapy and the SNF to work on hip range of motion, lower extremity strengthening and gait training. She should use a walker for assistance with ambulation at all times.  Patient will follow up with Dr. Thornton Park at [facility], [facility] office in 10-14 days for wound check, staple removal and x-ray.  Diabetic diet.  2 L O2 continuous thru Nasal canula

## 2015-11-08 NOTE — Progress Notes (Signed)
Bladder scan 72 cc. No void since cath done at 0630. rn spoke with dr Edwina Barth. md orders i/o q8hr prn bladder scan >500. Pt not eating . Ensure ordered bid

## 2015-11-08 NOTE — Consult Note (Signed)
WOC wound consult note Reason for Consult: Deep tissue injury to right heel, present on admission.  Bruising to right dorsal foot, present on admission.  Wound type:Deep tissue injury Pressure Ulcer POA: Yes Measurement:Right dorsal foot 1 cm x 6 cm intact maroon discoloration.   Right heel 1 cm in diameter maroon discoloration.  Wound bed:Intact Drainage (amount, consistency, odor)None  Periwound:Intact Blanchable redness to bilateral heels.  Dressing procedure/placement/frequency:Silicone border foam to right dorsal foot dressing. Change every 5 days and PRN soilage.  Float bilateral heels with a pillow under the calves.  Will not follow at this time.  Please re-consult if needed.  Domenic Moras RN BSN [place] Pager [phone]

## 2015-11-08 NOTE — Progress Notes (Signed)
[facility] [place] NAME: [name]    MR#:  [id number]  DATE OF BIRTH:  10-23-1935  SUBJECTIVE:  Patient still agitated but mild improvement from yesterday. Had difficulty voiding overnight.  REVIEW OF SYSTEMS:  Unable to abtain because of confusion  DRUG ALLERGIES:   Allergies  Allergen Reactions  . Codeine Nausea And Vomiting  . Penicillins Hives    Has patient had a PCN reaction causing immediate rash, facial/tongue/throat swelling, SOB or lightheadedness with hypotension: no  Has patient had a PCN reaction causing severe rash involving mucus membranes or skin necrosis: no  Has patient had a PCN reaction that required hospitalization: no  Has patient had a PCN reaction occurring within the last 10 years: no  If all of the above answers are "NO", then may proceed with Cephalosporin use.   . Sulfa Antibiotics   . Doxycycline Rash  . Erythromycin Rash  . Latex Rash    VITALS:  Blood pressure 148/59, pulse 72, temperature 99.2 F (37.3 C), temperature source Axillary, resp. rate 17, height 5\' 4"  (1.626 m), weight 51.71 kg (114 lb), SpO2 100 %.  PHYSICAL EXAMINATION:  GENERAL:Mildly agitated CV: RRR No mumurs. Lungs: CTA Abd: Soft nontender, BS+ Ext: No edema   LABORATORY PANEL:   CBC  Recent Labs Lab 11/08/15 0712  WBC 7.4  HGB 9.2*  HCT 27.2*  PLT 197   ------------------------------------------------------------------------------------------------------------------  Chemistries   Recent Labs Lab 11/04/15 1204  11/08/15 0712  NA 138  < > 139  K 5.0  < > 3.8  CL 100*  < > 102  CO2 31  < > 33*  GLUCOSE 191*  < > 203*  BUN 21*  < > 13  CREATININE 0.86  < > 0.65  CALCIUM 9.7  < > 8.3*  MG 1.8  --   --   AST 29  --   --   ALT 22  --   --   ALKPHOS 102  --   --   BILITOT 0.5  --   --   < > = values in this interval not  displayed. ------------------------------------------------------------------------------------------------------------------  Cardiac Enzymes  Recent Labs Lab 11/04/15 1204  TROPONINI <0.03   ------------------------------------------------------------------------------------------------------------------  RADIOLOGY:  Dg Hip Port Unilat With Pelvis 1v Right  11/06/2015  CLINICAL DATA:  Status post right hemiarthroplasty. EXAM: DG HIP (WITH OR WITHOUT PELVIS) 1V PORT RIGHT COMPARISON:  Nov 04, 2015. FINDINGS: Right hip prosthesis appears to be well position. Expected postoperative findings are seen in the surrounding soft tissues. No fracture or dislocation is noted. Previously placed left hip prosthesis is again noted. IMPRESSION: Status post right hemiarthroplasty. Electronically Signed   By: Marijo Conception, M.D.   On: 11/06/2015 12:42    EKG:   Orders placed or performed during the hospital encounter of 11/04/15  . EKG 12-Lead  . EKG 12-Lead  . ED EKG  . ED EKG    ASSESSMENT AND PLAN:    #1 right femoral neck fracture- due to mechanical fall.  POD #2 Management as per ORTHO  #2 AMS: Found out from daughter she has been on nightly ativan for yrs. Has not had ativan in about 48 hrs. Suspect she maybe withdrawing some. Will order scheduled ativan at night and prn during the day. Did better overnight with nightly scheduled ativan.  #2 paroxysmal atrial fibrillation- on amiodarone. Now in NSR Restarted xeralto  #3 hyperkalemia.Resolved.  #4 multiple falls and  memory loss- seeing Dr. Melrose Nakayama. She could not tolerated Namenda They are awaiting second opinion from [facility] neurology Physical therapy consult tomorrow  #5 diabetes mellitus- Restart tradjenta in am and continue SSI on clear liquid diet today. Hold metformin until discharge    Management plans discussed with the patient and family and they are in agreement  CODE STATUS: Full code  TOTAL TIME TAKING CARE OF  THIS PATIENT:32minutes.   POSSIBLE D/C IN 2 DAYS, DEPENDING ON CLINICAL CONDITION.   Dana Allan.D on 11/08/2015 at 8:35 AM  Between 7am to 6pm - Pager - [phone]  After 6pm go to [email or web address] - password [email or web address] [place] [facility]  Office  [phone]  CC: Primary care physician; Halina Maidens, MD

## 2015-11-08 NOTE — Progress Notes (Signed)
Mrs Shreeves still unable to void post Foley removal, bladder scan >517mL. MD notified, order to in and out cath. 539mL output with in and out cath. Nursing continues to monitor.

## 2015-11-08 NOTE — Progress Notes (Signed)
Mrs Reichling unable to void, bladder scan 352mL. Dr Marcille Blanco, MD on-call notified, advised to continue to monitor for now.

## 2015-11-09 ENCOUNTER — Inpatient Hospital Stay: Payer: Medicare Other

## 2015-11-09 ENCOUNTER — Encounter: Payer: Self-pay | Admitting: Orthopedic Surgery

## 2015-11-09 LAB — GLUCOSE, CAPILLARY
GLUCOSE-CAPILLARY: 177 mg/dL — AB (ref 65–99)
GLUCOSE-CAPILLARY: 262 mg/dL — AB (ref 65–99)
GLUCOSE-CAPILLARY: 239 mg/dL — AB (ref 65–99)
GLUCOSE-CAPILLARY: 173 mg/dL — AB (ref 65–99)
Glucose-Capillary: 266 mg/dL — ABNORMAL HIGH (ref 65–99)

## 2015-11-09 LAB — CBC
HEMATOCRIT: 29.3 % — AB (ref 35.0–47.0)
HEMOGLOBIN: 9.5 g/dL — AB (ref 12.0–16.0)
MCH: 28.3 pg (ref 26.0–34.0)
MCHC: 32.5 g/dL (ref 32.0–36.0)
MCV: 87 fL (ref 80.0–100.0)
Platelets: 206 10*3/uL (ref 150–440)
RBC: 3.36 MIL/uL — AB (ref 3.80–5.20)
RDW: 15.1 % — ABNORMAL HIGH (ref 11.5–14.5)
WBC: 6.8 10*3/uL (ref 3.6–11.0)

## 2015-11-09 MED ORDER — IPRATROPIUM-ALBUTEROL 0.5-2.5 (3) MG/3ML IN SOLN
3.0000 mL | RESPIRATORY_TRACT | Status: DC
  Administered 2015-11-09 – 2015-11-10 (×3): 3 mL via RESPIRATORY_TRACT
  Filled 2015-11-09 (×4): qty 3

## 2015-11-09 MED ORDER — SUCRALFATE 1 GM/10ML PO SUSP
1.0000 g | Freq: Three times a day (TID) | ORAL | Status: DC
  Administered 2015-11-09 – 2015-11-10 (×4): 1 g via ORAL
  Filled 2015-11-09 (×6): qty 10

## 2015-11-09 MED ORDER — IPRATROPIUM-ALBUTEROL 0.5-2.5 (3) MG/3ML IN SOLN: 3.0000 mL | RESPIRATORY_TRACT | Status: DC | PRN

## 2015-11-09 MED ORDER — BISACODYL 5 MG PO TBEC
10.0000 mg | DELAYED_RELEASE_TABLET | Freq: Every day | ORAL | Status: DC
  Administered 2015-11-10: 10 mg via ORAL
  Filled 2015-11-09: qty 2

## 2015-11-09 MED ORDER — HYDROMORPHONE HCL 1 MG/ML IJ SOLN: 0.5000 mg | INTRAMUSCULAR | Status: DC | PRN

## 2015-11-09 MED ORDER — LORAZEPAM 0.5 MG PO TABS: 0.5000 mg | ORAL_TABLET | Freq: Two times a day (BID) | ORAL | Status: DC | PRN

## 2015-11-09 NOTE — Progress Notes (Addendum)
Inpatient Diabetes Program Recommendations  AACE/ADA: New Consensus Statement on Inpatient Glycemic Control (2015)  Target Ranges:  Prepandial:   less than 140 mg/dL      Peak postprandial:   less than 180 mg/dL (1-2 hours)      Critically ill patients:  140 - 180 mg/dL   Results for [name], [name] (MRN [id number]) as of 11/09/2015 10:37  Ref. Range 11/08/2015 07:27 11/08/2015 11:23 11/08/2015 15:32 11/08/2015 22:23  Glucose-Capillary Latest Ref Range: 65-99 mg/dL 177 (H) 212 (H) 194 (H) 151 (H)   Results for [name], [name] (MRN [id number]) as of 11/09/2015 10:37  Ref. Range 11/09/2015 07:34  Glucose-Capillary Latest Ref Range: 65-99 mg/dL 262 (H)    Home DM Meds: Januvia 50 mg daily       Metformin 500 mg QPM  Current Insulin Orders: Tradjenta 5 mg daily        Novolog Sensitive Correction Scale/ SSI (0-9 units) TID AC + HS       MD- Note patient having issues with elevated fasting glucose levels.  AM CBG today: 262 mg/dl.  Please consider starting Levemir 7 units daily (0.15 units/kg dosing) while home Metformin on hold.     --Will follow patient during hospitalization--  Wyn Quaker RN, MSN, CDE Diabetes Coordinator Inpatient Glycemic Control Team Team Pager: [phone] (8a-5p)

## 2015-11-09 NOTE — Progress Notes (Signed)
[facility] at [place] NAME: Chauntae Bulley    MR#:  [id number]  DATE OF BIRTH:  1935-07-31  SUBJECTIVE:   Confused. Complains of multiple sites of pain. Has not slept well in 3 days. Audible wheezing.  REVIEW OF SYSTEMS:  Unable to abtain because of confusion  DRUG ALLERGIES:   Allergies  Allergen Reactions  . Codeine Nausea And Vomiting  . Penicillins Hives    Has patient had a PCN reaction causing immediate rash, facial/tongue/throat swelling, SOB or lightheadedness with hypotension: no  Has patient had a PCN reaction causing severe rash involving mucus membranes or skin necrosis: no  Has patient had a PCN reaction that required hospitalization: no  Has patient had a PCN reaction occurring within the last 10 years: no  If all of the above answers are "NO", then may proceed with Cephalosporin use.   . Sulfa Antibiotics   . Doxycycline Rash  . Erythromycin Rash  . Latex Rash    VITALS:  Blood pressure 143/64, pulse 75, temperature 98.9 F (37.2 C), temperature source Oral, resp. rate 22, height 5\' 4"  (1.626 m), weight 51.71 kg (114 lb), SpO2 100 %.  PHYSICAL EXAMINATION:   GENERAL:Mildly agitated heent - atraumatic. No pallor. No icterus. Pupils bilaterally equal and reactive CV: RRR,  No mumurs. Lungs: Bilateral wheezing. Poor air entry. Abd: Soft nontender, BS+ Ext: No edema, right hip dressing Psychiatry - anxious.  LABORATORY PANEL:   CBC  Recent Labs Lab 11/09/15 0322  WBC 6.8  HGB 9.5*  HCT 29.3*  PLT 206   ------------------------------------------------------------------------------------------------------------------  Chemistries   Recent Labs Lab 11/04/15 1204  11/08/15 0712  NA 138  < > 139  K 5.0  < > 3.8  CL 100*  < > 102  CO2 31  < > 33*  GLUCOSE 191*  < > 203*  BUN 21*  < > 13  CREATININE 0.86  < > 0.65  CALCIUM 9.7  < > 8.3*  MG 1.8  --   --   AST 29  --   --   ALT 22  --    --   ALKPHOS 102  --   --   BILITOT 0.5  --   --   < > = values in this interval not displayed. ------------------------------------------------------------------------------------------------------------------  Cardiac Enzymes  Recent Labs Lab 11/04/15 1204  TROPONINI <0.03   ------------------------------------------------------------------------------------------------------------------  RADIOLOGY:  Dg Chest Port 1 View  11/09/2015  CLINICAL DATA:  Hypoxia EXAM: PORTABLE CHEST 1 VIEW COMPARISON:  11/04/2015 FINDINGS: Chronic hyperinflation correlating with COPD history. There is no edema, consolidation, effusion, or pneumothorax. Normal heart size and mediastinal contours. IMPRESSION: COPD without acute superimposed finding. Electronically Signed   By: Monte Fantasia M.D.   On: 11/09/2015 12:58    EKG:   Orders placed or performed during the hospital encounter of 11/04/15  . EKG 12-Lead  . EKG 12-Lead  . ED EKG  . ED EKG    ASSESSMENT AND PLAN:   # Acute COPD exacerbation with hypoxic respiratory failure Start DuoNeb every 4 hours. Daughter requests we do not start steroids due to her blood sugars being uncontrolled with it. Will need steroids if no improvement in 24 hours. Stat chest x-ray ordered and showed no acute changes.  # Right femoral neck fracture- due to mechanical fall.  POD #3 Management as per ORTHO Pain medications as needed  # paroxysmal atrial fibrillation on amiodarone. Now in NSR Restarted xaralto  #  hyperkalemia. Resolved.  # Dementia  Patient is following with Dr. Melrose Nakayama with neurology. Now she has inpatient delirium over dementia.  # diabetes mellitus- Restart tradjenta in am and continue SSI  Metformin on hold.  Management plans discussed with the patient and family and they are in agreement  CODE STATUS: Full code  TOTAL TIME TAKING CARE OF THIS PATIENT:110minutes.   POSSIBLE D/C IN 2 DAYS, DEPENDING ON CLINICAL  CONDITION.  Hillary Bow R M.D on 11/09/2015 at 4:00 PM  Between 7am to 6pm - Pager - [phone]  After 6pm go to [email or web address] - password [email or web address] [place] [facility]  Office  [phone]  CC: Primary care physician; Halina Maidens, MD

## 2015-11-09 NOTE — Care Management Important Message (Signed)
Important Message  Patient Details  Name: [name] MRN: [id number] Date of Birth: [birth year]   Medicare Important Message Given:  Yes    [name] 11/09/2015, 12:39 PM

## 2015-11-09 NOTE — Progress Notes (Signed)
Pt unable to void, bladder scan done 716ml. Prime doc [facility] paged and made aware, ordered for in and out catheter. In and out cath done aseptically, drained 742ml. Will continue to monitor.

## 2015-11-09 NOTE — Progress Notes (Signed)
Physical Therapy Treatment Patient Details Name: [name] MRN: [id number] DOB: [birth year] Today's Date: 11/09/2015    History of Present Illness [name] is a 80 y.o. female with a known history of Multiple medical problems including diabetes mellitus, COPD not on home oxygen, SLE, Raynaud, rheumatic fever, hypertension, peptic ulcer disease, paroxysmal atrial fibrillation on xarelto, recent admission for fall and left hip fracture in October 2016; She is admitted after a fall and right femoral neck fracture. Patient is s/p right hemiarthroplasty on 11/06/15; She is PWB in RLE and has posterior hip precautions. OT Evaluation not completed due to decreased arousal/alertness. PT and spouse report pt unable to wake up after receiving medications this am. Will attempt to eval at later date when pt is more appropriate.    PT Comments    Pt is making slow progress towards goals. Pt very confused this PM session and insistent about going home. Pt becomes tearful when she realizes she is in the hospital. Pt requires heavy assist for all mobility, +2 for OOB. Pt takes increased encouragement to work with therapy and is limited by pain.  Follow Up Recommendations  SNF     Equipment Recommendations       Recommendations for Other Services       Precautions / Restrictions Precautions Precautions: Posterior Hip Precaution Comments: RLE Restrictions Weight Bearing Restrictions: Yes RLE Weight Bearing: Partial weight bearing Other Position/Activity Restrictions: patient on posterior hip precautions for RLE    Mobility  Bed Mobility Overal bed mobility: Needs Assistance Bed Mobility: Supine to Sit     Supine to sit: Mod assist;HOB elevated;+2 for safety/equipment     General bed mobility comments: Heavy assist for mobility with +2 for initiating movement. Once seated at EOB, pt able to sit with min assist progressing to cga after a few minutes. Pt complains of pain with all  movement  Transfers Overall transfer level: Needs assistance Equipment used: Rolling walker (2 wheeled) Transfers: Sit to/from Stand Sit to Stand: Mod assist;+2 safety/equipment         General transfer comment: Cues for hand placement. Pt with heavy post lean once standing with manual assistance for anterior translation. Once standing, able to stand with min assist + 2.  Ambulation/Gait Ambulation/Gait assistance: Max assist;+2 physical assistance Ambulation Distance (Feet): 3 Feet Assistive device: Rolling walker (2 wheeled) Gait Pattern/deviations: Step-to pattern     General Gait Details: Slow ambulation with pt sliding R foot as she is unable to pick off floor. Heavy R side leaning, cues given for pushing on RW. Pt able to take short steps to [facility].   Stairs            Wheelchair Mobility    Modified Rankin (Stroke Patients Only)       Balance                                    Cognition Arousal/Alertness: Awake/alert Behavior During Therapy: Flat affect Overall Cognitive Status: Impaired/Different from baseline                      Exercises Total Joint Exercises Ankle Circles/Pumps: Other (comment) Other Exercises Other Exercises: Supine ther-ex performed on B LE x 15 reps including ankle pumps, SAQ, SLRs, heel slides, and hip abd/add. All ther-ex performed x max assist on R LE and min assist on L LE. Cues for correct technique given. Other  Exercises: Pt with positive BM and needs heavy assist of +2 for hygiene. Safe technique performed. Pt fatigues quickly with all endurance    General Comments        Pertinent Vitals/Pain Pain Assessment: Faces Faces Pain Scale: Hurts whole lot Pain Location: R hip Pain Descriptors / Indicators: Operative site guarding Pain Intervention(s): Limited activity within patient's tolerance    Home Living                      Prior Function            PT Goals (current goals can  now be found in the care plan section) Acute Rehab PT Goals Patient Stated Goal: To get better. PT Goal Formulation: With patient Time For Goal Achievement: 11/21/15 Potential to Achieve Goals: Good Progress towards PT goals: Progressing toward goals    Frequency  BID    PT Plan Current plan remains appropriate    Co-evaluation             End of Session Equipment Utilized During Treatment: Gait belt Activity Tolerance: Patient limited by pain;Patient limited by fatigue;Patient limited by lethargy Patient left: with call bell/phone within reach;in bed;with bed alarm set;with nursing/sitter in room     Time: 1341-1414 PT Time Calculation (min) (ACUTE ONLY): 33 min  Charges:  $Gait Training: 8-22 mins $Therapeutic Exercise: 8-22 mins                    G Codes:      Kalis Friese 11/11/15, 4:40 PM Greggory Stallion, PT, DPT [phone]

## 2015-11-09 NOTE — Progress Notes (Signed)
Physical Therapy Treatment Patient Details Name: [name] MRN: [id number] DOB: 1935/07/17 Today's Date: 11/09/2015    History of Present Illness [name] is a 80 y.o. female with a known history of Multiple medical problems including diabetes mellitus, COPD not on home oxygen, SLE, Raynaud, rheumatic fever, hypertension, peptic ulcer disease, paroxysmal atrial fibrillation on xarelto, recent admission for fall and left hip fracture in October 2016; She is admitted after a fall and right femoral neck fracture. Patient is s/p right hemiarthroplasty on 11/06/15; She is PWB in RLE and has posterior hip precautions. OT Evaluation not completed due to decreased arousal/alertness. PT and spouse report pt unable to wake up after receiving medications this am. Will attempt to eval at later date when pt is more appropriate.    PT Comments    Pt is making limited progress towards goals with increased mobility this date. Pt with limited WB on R LE and needs cues for maintaining hip precautions. +2 assist for ambulation and transfer to recliner. Pt lethargic, however able to participate with larger movements. Pt performed mobility on room air with sats decreasing to 80%, improving to 94% at rest with 2L of O2 applied.  Follow Up Recommendations  SNF     Equipment Recommendations  None recommended by PT    Recommendations for Other Services       Precautions / Restrictions Precautions Precautions: Posterior Hip Precaution Comments: RLE Restrictions Weight Bearing Restrictions: Yes RLE Weight Bearing: Partial weight bearing Other Position/Activity Restrictions: patient on posterior hip precautions for RLE    Mobility  Bed Mobility Overal bed mobility: Needs Assistance Bed Mobility: Supine to Sit     Supine to sit: Mod assist;HOB elevated     General bed mobility comments: Heavy cues for mobility including using rails and initiating movement. Pt very lethargic, needs min  assist to maintain sitting balance. Able to progress to cga after a few minutes.  Transfers Overall transfer level: Needs assistance Equipment used: Rolling walker (2 wheeled) Transfers: Sit to/from Stand Sit to Stand: Mod assist         General transfer comment: Cues for hand placement. Pt with heavy post lean once standing with manual assistance for anterior translation. Once standing, able to stand with min assist + 2.  Ambulation/Gait Ambulation/Gait assistance: Mod assist;+2 physical assistance Ambulation Distance (Feet): 5 Feet Assistive device: Rolling walker (2 wheeled) Gait Pattern/deviations: Step-to pattern     General Gait Details: Small short gait pattern noted with shuffling technique. Heavy manual cues for faciliation of larger steps with B LE. Heavy post L side leaning. Recliner brought up behind patient in order to sit down.   Stairs            Wheelchair Mobility    Modified Rankin (Stroke Patients Only)       Balance                                    Cognition Arousal/Alertness: Lethargic Behavior During Therapy: Flat affect Overall Cognitive Status: Difficult to assess       Memory: Decreased recall of precautions;Decreased short-term memory              Exercises Other Exercises Other Exercises: Supine ther-ex performed on B LE x 15 reps including ankle pumps, quad sets, SLRs, heel slides, and hip abd/add. All ther-ex performed x max assist on R LE and min assist on L LE.  Cues for correct technique given.    General Comments        Pertinent Vitals/Pain Pain Assessment: Faces Faces Pain Scale: Hurts little more Pain Location: right hip with movement Pain Descriptors / Indicators: Discomfort;Dull Pain Intervention(s): Ice applied;Limited activity within patient's tolerance    Home Living                      Prior Function            PT Goals (current goals can now be found in the care plan  section) Acute Rehab PT Goals Patient Stated Goal: "I want to be able to care for myself again." PT Goal Formulation: With patient Time For Goal Achievement: 11/21/15 Potential to Achieve Goals: Good Progress towards PT goals: Progressing toward goals    Frequency  BID    PT Plan Current plan remains appropriate    Co-evaluation             End of Session Equipment Utilized During Treatment: Gait belt Activity Tolerance: Patient limited by pain;Patient limited by fatigue;Patient limited by lethargy Patient left: with call bell/phone within reach;in bed;with bed alarm set;with nursing/sitter in room     Time: 0851-0919 PT Time Calculation (min) (ACUTE ONLY): 28 min  Charges:  $Gait Training: 8-22 mins $Therapeutic Exercise: 8-22 mins                    G Codes:      Fausto Sampedro Dec 08, 2015, 10:44 AM  Greggory Stallion, PT, DPT [phone]

## 2015-11-09 NOTE — Progress Notes (Addendum)
  Subjective:  Patient up out of bed to a chair. Her family is at the bedside. Patient is postop day #3 status post right hip hemiarthroplasty. Patient reports pain as mild to moderate.  Patient complains of lower abdominal pain and feels she needs to use the bathroom.  Objective:   VITALS:   Filed Vitals:   11/08/15 1522 11/08/15 2005 11/09/15 0629 11/09/15 0812  BP: 121/49 145/54 133/56 152/69  Pulse: 68 74 73 71  Temp:  98 F (36.7 C) 98.1 F (36.7 C) 98.9 F (37.2 C)  TempSrc:  Axillary Oral Axillary  Resp:  19  20  Height:      Weight:      SpO2: 97% 98% 95% 100%    PHYSICAL EXAM:  Right lower extremity: Patient has mild 0 seconds drainage on the dressing that I changed yesterday. Her thigh compartments are soft and compressible. She has no calf tenderness. Her heels are elevated off the chair. She has palpable pedal pulses and intact sensation light touch. Patient flex and extend her toes and dorsiflex and plantarflex her ankle.  LABS  Results for orders placed or performed during the hospital encounter of 11/04/15 (from the past 24 hour(s))  Glucose, capillary     Status: Abnormal   Collection Time: 11/08/15 11:23 AM  Result Value Ref Range   Glucose-Capillary 212 (H) 65 - 99 mg/dL  Glucose, capillary     Status: Abnormal   Collection Time: 11/08/15  3:32 PM  Result Value Ref Range   Glucose-Capillary 194 (H) 65 - 99 mg/dL  Glucose, capillary     Status: Abnormal   Collection Time: 11/08/15 10:23 PM  Result Value Ref Range   Glucose-Capillary 151 (H) 65 - 99 mg/dL   Comment 1 Notify RN   CBC     Status: Abnormal   Collection Time: 11/09/15  3:22 AM  Result Value Ref Range   WBC 6.8 3.6 - 11.0 K/uL   RBC 3.36 (L) 3.80 - 5.20 MIL/uL   Hemoglobin 9.5 (L) 12.0 - 16.0 g/dL   HCT 29.3 (L) 35.0 - 47.0 %   MCV 87.0 80.0 - 100.0 fL   MCH 28.3 26.0 - 34.0 pg   MCHC 32.5 32.0 - 36.0 g/dL   RDW 15.1 (H) 11.5 - 14.5 %   Platelets 206 150 - 440 K/uL  Glucose, capillary      Status: Abnormal   Collection Time: 11/09/15  7:34 AM  Result Value Ref Range   Glucose-Capillary 262 (H) 65 - 99 mg/dL   Comment 1 Notify RN     No results found.  Assessment/Plan: 3 Days Post-Op   Active Problems:   Hip fracture [facility])  Patient continues to improve an orthopedic standpoint. Patient presented issues with confusion postop and has baseline dementia.  I spoke with Dr. Darvin Neighbours who is ordering a chest x-ray due to wheezing he has auscultated. Patient is ready for an orthopedic standpoint for discharge to skilled nursing facility when cleared by medicine. She will remain partial weightbearing on the right lower extremity for 4 weeks postop. She will continue physical and occupational therapy.  Patient has heel ulcers.  She has not yet received her heel protector boots. I am really ordering these as well as a wound care consult.   Thornton Park , MD 11/09/2015, 10:56 AM

## 2015-11-09 NOTE — Progress Notes (Signed)
ANTICOAGULATION CONSULT NOTE - Initial Consult  Pharmacy Consult for rivaroxaban  Indication: atrial fibrillation  Allergies  Allergen Reactions  . Codeine Nausea And Vomiting  . Penicillins Hives    Has patient had a PCN reaction causing immediate rash, facial/tongue/throat swelling, SOB or lightheadedness with hypotension: no  Has patient had a PCN reaction causing severe rash involving mucus membranes or skin necrosis: no  Has patient had a PCN reaction that required hospitalization: no  Has patient had a PCN reaction occurring within the last 10 years: no  If all of the above answers are "NO", then may proceed with Cephalosporin use.   . Sulfa Antibiotics   . Doxycycline Rash  . Erythromycin Rash  . Latex Rash    Patient Measurements: Height: 5\' 4"  (162.6 cm) Weight: 114 lb (51.71 kg) IBW/kg (Calculated) : 54.7   Vital Signs: Temp: 98.9 F (37.2 C) (05/30 0812) Temp Source: Axillary (05/30 0812) BP: 152/69 mmHg (05/30 0812) Pulse Rate: 71 (05/30 0812)  Labs:  Recent Labs  11/07/15 0416 11/08/15 0712 11/09/15 0322  HGB 10.4* 9.2* 9.5*  HCT 32.3* 27.2* 29.3*  PLT 215 197 206  CREATININE 0.61 0.65  --     Estimated Creatinine Clearance: 48.1 mL/min (by C-G formula based on Cr of 0.65).   Medical History: Past Medical History  Diagnosis Date  . DM (diabetes mellitus) ([place])   . Mitral valve prolapse     a. Not noted on 03/2015 Echo.  Marland Kitchen COPD (chronic obstructive pulmonary disease) ([place])   . Palpitations   . Osteoarthritis   . Systemic lupus erythematosus ([place])   . Lumbar spondylolysis   . Raynaud's disease   . Anxiety   . IBS (irritable bowel syndrome)   . Uveitis, anterior     ????  . Rheumatic fever   . Ankylosing spondylitis ([place])     ? how diagnosed  . Insomnia, unspecified   . Chronic peptic ulcer, unspecified site, without mention of hemorrhage, perforation, or obstruction   . History of kidney stones   . Chronic kidney infection   . Asthma      as child  . Hypothyroidism     mass, U/S 6/21 - nodules  . Motion sickness     car - back seat  . GERD (gastroesophageal reflux disease)   . Wears dentures     full upper, partial lower  . Wears contact lenses   . COPD (chronic obstructive pulmonary disease) ([place])     a.Uses 2l prn - followed by Dr. Raul Del.  . Raynaud disease   . Headache   . Neuropathy ([place])   . Wheezing   . Chronic diastolic CHF (congestive heart failure) ([place])     a. 03/2015 Echo: EF 55-60%, Gr 1 DD.  Marland Kitchen PAF (paroxysmal atrial fibrillation) ([place])     a. 03/2015 in setting of hip Fx->Amio/Xarelo;  b. CHA2DS2VASc= 4.  . Left displaced femoral neck fracture ([place])     a. 03/2015 s/p L hemiarthroplasty.  . Diabetes mellitus ([facility])     Medications:  Scheduled:  . acidophilus  1 capsule Oral Daily  . amiodarone  200 mg Oral Daily  . docusate sodium  100 mg Oral BID  . famotidine  20 mg Oral Daily  . feeding supplement (ENSURE ENLIVE)  237 mL Oral BID BM  . ferrous sulfate  325 mg Oral TID PC  . insulin aspart  0-5 Units Subcutaneous QHS  . insulin aspart  0-9 Units Subcutaneous TID WC  .  linagliptin  5 mg Oral Daily  . LORazepam  1 mg Oral QHS  . metoprolol tartrate  25 mg Oral BID  . mometasone-formoterol  2 puff Inhalation BID  . rivaroxaban  15 mg Oral Q supper  . senna  1 tablet Oral BID  . tiotropium  18 mcg Inhalation Daily   Infusions:     Assessment: Pharmacy consulted to restart patient's home dose of Xarelto.  Patient takes Xarelto 20 mg po qsupper as an outpatient.  Patient received Xarelto 10 mg po this morning (ordered for DVT prophylaxis after hip fracture).   Est CrCl~48 mL/min  Goal of Therapy:  Monitor SCr and CBC per policy   Plan:  Will continue Xarelto 15 mg po daily based on CrCl<50 mL/min.    Pharmacy will continue to monitor and adjust per consult.   Currie Paris  11/09/2015

## 2015-11-09 NOTE — Evaluation (Signed)
Occupational Therapy Evaluation Patient Details Name: [name] MRN: [id number] DOB: 30-Dec-1935 Today's Date: 11/09/2015    History of Present Illness Sharnice Klauck is a 80 y.o. female with a known history of Multiple medical problems including diabetes mellitus, COPD not on home oxygen, SLE, Raynaud, rheumatic fever, hypertension, peptic ulcer disease, paroxysmal atrial fibrillation on xarelto, recent admission for fall and left hip fracture in October 2016; She is admitted after a fall and right femoral neck fracture. Patient is s/p right hemiarthroplasty on 11/06/15; She is PWB in RLE and has posterior hip precautions.   Clinical Impression   Pt. Is a 80 y.o. female who was admitted to [facility] for surgical intervention following a Right Fracture s/p Hemiarthroplasty Pt. Presents with pain, limited ROM, weakness, lethargy, and limited activity tolerance which hinder her ability to complete ADL tasks. Pt. Could benefit from skilled OT services to review adaptive equipment training for LE ADLs, review posterior hip precautions review work simplification techniques, and to improve ADLfunctioning and work towards returning to her PLOF.    Follow Up Recommendations  SNF    Equipment Recommendations       Recommendations for Other Services PT consult     Precautions / Restrictions Precautions Precautions: Posterior Hip Precaution Comments: RLE Restrictions Weight Bearing Restrictions: Yes RLE Weight Bearing: Partial weight bearing Other Position/Activity Restrictions: patient on posterior hip precautions for RLE      Mobility Bed Mobility  Pt. Up in chair upon arrival. Lethargic.  Balance                                            ADL Overall ADL's : Needs assistance/impaired     Grooming: Set up                   Toilet Transfer: Maximal assistance             General ADL Comments: Pt. is extensive Assist for LE  ADL tasks secondary  to lethargy. Pt. education was provided about A/E use for LE ADL/IADLs.     Vision     Perception     Praxis      Pertinent Vitals/Pain Pain Assessment: 0-10 Pain Score: 5  Faces Pain Scale: Hurts little more Pain Location: right hip with movement Pain Descriptors / Indicators: Discomfort;Dull Pain Intervention(s): Limited activity within patient's tolerance     Hand Dominance Right   Extremity/Trunk Assessment Upper Extremity Assessment Upper Extremity Assessment: Generalized weakness           Communication Communication Communication: No difficulties   Cognition Arousal/Alertness: Lethargic Behavior During Therapy: Flat affect Overall Cognitive Status: Difficult to assess       Memory: Decreased recall of precautions;Decreased short-term memory             General Comments       Exercises   Shoulder Instructions      Home Living Family/patient expects to be discharged to:: Skilled nursing facility Living Arrangements: Spouse/significant other Available Help at Discharge: Family Type of Home: House Home Access: Stairs to enter [employer] of Steps: 1 Entrance Stairs-Rails: None Home Layout: Two level;Able to live on main level with bedroom/bathroom     Bathroom Shower/Tub: Walk-in shower         Home Equipment: Bedside commode;Walker - 2 wheels;Transport chair;Shower seat  Prior Functioning/Environment Level of Independence: Independent with assistive device(s);Needs assistance  Gait / Transfers Assistance Needed: used RW for most gait tasks;  ADL's / Homemaking Assistance Needed: had home health nurse 4 days a week for 1/2 a day to assist with cooking, cleaning and bathing;         OT Diagnosis: Generalized weakness   OT Problem List: Decreased strength;Pain;Decreased activity tolerance;Decreased knowledge of use of DME or AE   OT Treatment/Interventions:      OT Goals(Current goals can be found in the care  plan section) Acute Rehab OT Goals Patient Stated Goal: To get better.  OT Frequency:     Barriers to D/C:            Co-evaluation              End of Session    Activity Tolerance: Patient tolerated treatment well Patient left: in chair;with call bell/phone within reach;with chair alarm set;with family/visitor present   Time: [id number] OT Time Calculation (min): 15 min Charges:  OT General Charges $OT Visit: 1 Procedure OT Evaluation $OT Eval Low Complexity: 1 Procedure G-Codes:    Harrel Carina, MS, OTR/L Harrel Carina 11/09/2015, 10:54 AM

## 2015-11-10 DIAGNOSIS — M84459A Pathological fracture, hip, unspecified, initial encounter for fracture: Secondary | ICD-10-CM | POA: Diagnosis not present

## 2015-11-10 DIAGNOSIS — S72001D Fracture of unspecified part of neck of right femur, subsequent encounter for closed fracture with routine healing: Secondary | ICD-10-CM | POA: Diagnosis not present

## 2015-11-10 DIAGNOSIS — Z7901 Long term (current) use of anticoagulants: Secondary | ICD-10-CM | POA: Diagnosis not present

## 2015-11-10 DIAGNOSIS — R4182 Altered mental status, unspecified: Secondary | ICD-10-CM | POA: Diagnosis not present

## 2015-11-10 DIAGNOSIS — Z7401 Bed confinement status: Secondary | ICD-10-CM | POA: Diagnosis not present

## 2015-11-10 DIAGNOSIS — G8918 Other acute postprocedural pain: Secondary | ICD-10-CM | POA: Diagnosis not present

## 2015-11-10 DIAGNOSIS — I10 Essential (primary) hypertension: Secondary | ICD-10-CM | POA: Diagnosis not present

## 2015-11-10 DIAGNOSIS — Z741 Need for assistance with personal care: Secondary | ICD-10-CM | POA: Diagnosis not present

## 2015-11-10 DIAGNOSIS — M6281 Muscle weakness (generalized): Secondary | ICD-10-CM | POA: Diagnosis not present

## 2015-11-10 DIAGNOSIS — Z96641 Presence of right artificial hip joint: Secondary | ICD-10-CM | POA: Diagnosis not present

## 2015-11-10 DIAGNOSIS — J449 Chronic obstructive pulmonary disease, unspecified: Secondary | ICD-10-CM | POA: Diagnosis not present

## 2015-11-10 DIAGNOSIS — R262 Difficulty in walking, not elsewhere classified: Secondary | ICD-10-CM | POA: Diagnosis not present

## 2015-11-10 DIAGNOSIS — F0390 Unspecified dementia without behavioral disturbance: Secondary | ICD-10-CM | POA: Diagnosis not present

## 2015-11-10 DIAGNOSIS — L89610 Pressure ulcer of right heel, unstageable: Secondary | ICD-10-CM | POA: Diagnosis not present

## 2015-11-10 DIAGNOSIS — S72009A Fracture of unspecified part of neck of unspecified femur, initial encounter for closed fracture: Secondary | ICD-10-CM | POA: Diagnosis not present

## 2015-11-10 DIAGNOSIS — R269 Unspecified abnormalities of gait and mobility: Secondary | ICD-10-CM | POA: Diagnosis not present

## 2015-11-10 DIAGNOSIS — I4891 Unspecified atrial fibrillation: Secondary | ICD-10-CM | POA: Diagnosis not present

## 2015-11-10 DIAGNOSIS — L97312 Non-pressure chronic ulcer of right ankle with fat layer exposed: Secondary | ICD-10-CM | POA: Diagnosis not present

## 2015-11-10 DIAGNOSIS — E119 Type 2 diabetes mellitus without complications: Secondary | ICD-10-CM | POA: Diagnosis not present

## 2015-11-10 DIAGNOSIS — K279 Peptic ulcer, site unspecified, unspecified as acute or chronic, without hemorrhage or perforation: Secondary | ICD-10-CM | POA: Diagnosis not present

## 2015-11-10 DIAGNOSIS — R1312 Dysphagia, oropharyngeal phase: Secondary | ICD-10-CM | POA: Diagnosis not present

## 2015-11-10 DIAGNOSIS — R41841 Cognitive communication deficit: Secondary | ICD-10-CM | POA: Diagnosis not present

## 2015-11-10 DIAGNOSIS — R292 Abnormal reflex: Secondary | ICD-10-CM | POA: Diagnosis not present

## 2015-11-10 LAB — URINALYSIS COMPLETE WITH MICROSCOPIC (ARMC ONLY)
Bilirubin Urine: NEGATIVE
Glucose, UA: >500 mg/dL — AB
Nitrite: NEGATIVE
PH: 7 (ref 5.0–8.0)
PROTEIN: 30 mg/dL — AB
SPECIFIC GRAVITY, URINE: 1.009 (ref 1.005–1.030)

## 2015-11-10 LAB — GLUCOSE, CAPILLARY
GLUCOSE-CAPILLARY: 198 mg/dL — AB (ref 65–99)
Glucose-Capillary: 247 mg/dL — ABNORMAL HIGH (ref 65–99)

## 2015-11-10 LAB — SURGICAL PATHOLOGY

## 2015-11-10 MED ORDER — OXYCODONE HCL 5 MG PO TABS: 5.0000 mg | ORAL_TABLET | ORAL | Status: AC | PRN

## 2015-11-10 MED ORDER — LORAZEPAM 1 MG PO TABS
1.0000 mg | ORAL_TABLET | Freq: Every day | ORAL | Status: DC
Start: 2015-11-10 — End: 2016-07-03

## 2015-11-10 MED ORDER — CIPROFLOXACIN HCL 500 MG PO TABS
500.0000 mg | ORAL_TABLET | Freq: Once | ORAL | Status: AC
  Administered 2015-11-10: 500 mg via ORAL
  Filled 2015-11-10: qty 1

## 2015-11-10 MED ORDER — IPRATROPIUM-ALBUTEROL 0.5-2.5 (3) MG/3ML IN SOLN: 3.0000 mL | RESPIRATORY_TRACT | Status: DC | PRN

## 2015-11-10 MED ORDER — FOLIC ACID 1 MG PO TABS
ORAL_TABLET | ORAL | Status: AC
  Filled 2015-11-10: qty 1

## 2015-11-10 MED ORDER — FERROUS SULFATE 325 (65 FE) MG PO TABS: 325.0000 mg | ORAL_TABLET | Freq: Two times a day (BID) | ORAL | Status: AC

## 2015-11-10 MED ORDER — CIPROFLOXACIN HCL 500 MG PO TABS: 500.0000 mg | ORAL_TABLET | Freq: Once | ORAL | Status: DC

## 2015-11-10 MED ORDER — ENSURE ENLIVE PO LIQD: 237.0000 mL | Freq: Two times a day (BID) | ORAL | Status: AC

## 2015-11-10 MED ORDER — DOCUSATE SODIUM 100 MG PO CAPS: 100.0000 mg | ORAL_CAPSULE | Freq: Two times a day (BID) | ORAL | Status: AC

## 2015-11-10 NOTE — Progress Notes (Signed)
Subjective:  Patient doing well from an orthopedic standpoint. Patient reports pain as mild.  Patient complains of mild nausea. She is up out of bed to a chair. Her husband is at the bedside.  Objective:   VITALS:   Filed Vitals:   11/10/15 0047 11/10/15 0417 11/10/15 0753 11/10/15 0836  BP:  150/59 153/59   Pulse:  76 80   Temp:  98.2 F (36.8 C) 99.7 F (37.6 C)   TempSrc:  Oral Oral   Resp:  16 22   Height:      Weight:      SpO2: 93% 90% 97% 100%    PHYSICAL EXAM:  Right lower extremity: Patient's dressing has moderate serous drainage.  Her thigh compartments are soft and compressible. She has intact sensation light touch and has intact motor function distally in the right lower extremity.  LABS  Results for orders placed or performed during the hospital encounter of 11/04/15 (from the past 24 hour(s))  Glucose, capillary     Status: Abnormal   Collection Time: 11/09/15  4:33 PM  Result Value Ref Range   Glucose-Capillary 173 (H) 65 - 99 mg/dL  Glucose, capillary     Status: Abnormal   Collection Time: 11/09/15  8:43 PM  Result Value Ref Range   Glucose-Capillary 266 (H) 65 - 99 mg/dL  Glucose, capillary     Status: Abnormal   Collection Time: 11/10/15  7:47 AM  Result Value Ref Range   Glucose-Capillary 198 (H) 65 - 99 mg/dL  Glucose, capillary     Status: Abnormal   Collection Time: 11/10/15 11:34 AM  Result Value Ref Range   Glucose-Capillary 247 (H) 65 - 99 mg/dL   Comment 1 Notify RN   Urinalysis complete, with microscopic ([facility] only)     Status: Abnormal   Collection Time: 11/10/15 11:35 AM  Result Value Ref Range   Color, Urine YELLOW (A) YELLOW   APPearance CLOUDY (A) CLEAR   Glucose, UA >500 (A) NEGATIVE mg/dL   Bilirubin Urine NEGATIVE NEGATIVE   Ketones, ur 1+ (A) NEGATIVE mg/dL   Specific Gravity, Urine 1.009 1.005 - 1.030   Hgb urine dipstick 1+ (A) NEGATIVE   pH 7.0 5.0 - 8.0   Protein, ur 30 (A) NEGATIVE mg/dL   Nitrite NEGATIVE NEGATIVE    Leukocytes, UA 2+ (A) NEGATIVE   RBC / HPF 6-30 0 - 5 RBC/hpf   WBC, UA TOO NUMEROUS TO COUNT 0 - 5 WBC/hpf   Bacteria, UA FEW (A) NONE SEEN   Squamous Epithelial / LPF 0-5 (A) NONE SEEN   Mucous PRESENT     Dg Chest Port 1 View  11/09/2015  CLINICAL DATA:  Hypoxia EXAM: PORTABLE CHEST 1 VIEW COMPARISON:  11/04/2015 FINDINGS: Chronic hyperinflation correlating with COPD history. There is no edema, consolidation, effusion, or pneumothorax. Normal heart size and mediastinal contours. IMPRESSION: COPD without acute superimposed finding. Electronically Signed   By: Monte Fantasia M.D.   On: 11/09/2015 12:58    Assessment/Plan: 4 Days Post-Op   Active Problems:   Hip fracture [facility])  Patient should have her right hip dressing changed before discharge to rehabilitation. She is okay to be discharged from an orthopedic standpoint. She'll follow up with me in 10-14 days for wound check, staple removal and x-ray. Patient will remain partial weightbearing on the right lower extremity 4 weeks postop. She must continue to observe hip precautions. She should continue to wear her heel protector boots at rehabilitation. Patient will need  to have her heel ulcers frequently checked at rehabilitation and may need wound care there.    Thornton Park , MD 11/10/2015, 1:30 PM

## 2015-11-10 NOTE — Clinical Social Work Placement (Signed)
   CLINICAL SOCIAL WORK PLACEMENT  NOTE  Date:  11/10/2015  Patient Details  Name: [name] MRN: [id number] Date of Birth: 07/21/1935  Clinical Social Work is seeking post-discharge placement for this patient at the [place] level of care (*CSW will initial, date and re-position this form in  chart as items are completed):  Yes   Patient/family provided with [facility] Work Department's list of facilities offering this level of care within the geographic area requested by the patient (or if unable, by the patient's family).  Yes   Patient/family informed of their freedom to choose among providers that offer the needed level of care, that participate in Medicare, Medicaid or managed care program needed by the patient, have an available bed and are willing to accept the patient.  Yes   Patient/family informed of [facility]'s ownership interest in [facility] and [facility], as well as of the fact that they are under no obligation to receive care at these facilities.  PASRR submitted to EDS on       PASRR number received on       Existing PASRR number confirmed on 11/05/15     FL2 transmitted to all facilities in geographic area requested by pt/family on 11/05/15     FL2 transmitted to all facilities within larger geographic area on       Patient informed that his/her managed care company has contracts with or will negotiate with certain facilities, including the following:        Yes   Patient/family informed of bed offers received.  Patient chooses bed at  [facility] )     Physician recommends and patient chooses bed at      Patient to be transferred to  [facility] ) on 11/10/15.  Patient to be transferred to facility by  [facility] )     Patient family notified on 11/10/15 of transfer.  Name of family member notified:   ([name] is at bedside and aware of D/C today. )     PHYSICIAN        Additional Comment:    _______________________________________________ Loralyn Freshwater, LCSW 11/10/2015, 1:46 PM

## 2015-11-10 NOTE — Discharge Summary (Addendum)
[facility] [place] NAME: [name]    MR#:  [id number]  DATE OF BIRTH:  1936-04-06  DATE OF ADMISSION:  11/04/2015 ADMITTING PHYSICIAN: Gladstone Lighter, MD  DATE OF DISCHARGE: 11/10/2015  PRIMARY CARE PHYSICIAN: Halina Maidens, MD   ADMISSION DIAGNOSIS:  Hx of long term use of blood thinners [Z79.01] Femoral neck fracture, right, closed, initial encounter [S72.001A]  DISCHARGE DIAGNOSIS:  Active Problems:   Hip fracture ([place])   SECONDARY DIAGNOSIS:   Past Medical History  Diagnosis Date  . DM (diabetes mellitus) ([place])   . Mitral valve prolapse     a. Not noted on 03/2015 Echo.  Marland Kitchen COPD (chronic obstructive pulmonary disease) ([place])   . Palpitations   . Osteoarthritis   . Systemic lupus erythematosus ([place])   . Lumbar spondylolysis   . Raynaud's disease   . Anxiety   . IBS (irritable bowel syndrome)   . Uveitis, anterior     ????  . Rheumatic fever   . Ankylosing spondylitis ([place])     ? how diagnosed  . Insomnia, unspecified   . Chronic peptic ulcer, unspecified site, without mention of hemorrhage, perforation, or obstruction   . History of kidney stones   . Chronic kidney infection   . Asthma     as child  . Hypothyroidism     mass, U/S 6/21 - nodules  . Motion sickness     car - back seat  . GERD (gastroesophageal reflux disease)   . Wears dentures     full upper, partial lower  . Wears contact lenses   . COPD (chronic obstructive pulmonary disease) ([place])     a.Uses 2l prn - followed by Dr. Raul Del.  . Raynaud disease   . Headache   . Neuropathy ([place])   . Wheezing   . Chronic diastolic CHF (congestive heart failure) ([place])     a. 03/2015 Echo: EF 55-60%, Gr 1 DD.  Marland Kitchen PAF (paroxysmal atrial fibrillation) ([place])     a. 03/2015 in setting of hip Fx->Amio/Xarelo;  b. CHA2DS2VASc= 4.  . Left displaced femoral neck fracture ([place])     a. 03/2015 s/p L hemiarthroplasty.  . Diabetes mellitus ([place])       ADMITTING HISTORY  [name] is a 80 y.o. female with a known history of Multiple medical problems including diabetes mellitus, COPD not on home oxygen, SLE, Raynaud, rheumatic fever, hypertension, peptic ulcer disease, paroxysmal atrial fibrillation on xarelto, recent admission for fall and left hip fracture 6 months ago admitted after a fall and right femoral neck fracture. Patient recently having memory issues, so most of the history obtained from her as well as her husband. She has been having multiple falls lately at home. She has even seen a neurologist for the same - falls due to balance problems and also worsening memory. She is awaiting a second opinion from [facility]. Today she was walking in her bedroom and her bedroom slippers slipped away and she tripped on the fur carpet and fell. Right hip pain since then- X rays revealing femoral neck fracture on right side. Ortho consulted. Patient denies being sick, no nausea, vomiting. No loss of consciousness, lightheadedness or dizziness today.   HOSPITAL COURSE:   # Acute COPD exacerbation with hypoxic respiratory failure Improved well with Scheduled nebs. Change to PRN at d/c. chest x-ray ordered and showed no acute changes other than COPD  # Right femoral neck fracture- due to mechanical fall.  POD #4 F/U with ortho as OP in 10-14 days Pain medications as needed She is On Xarelto for atrial fibrillation.  # paroxysmal atrial fibrillation on amiodarone. Now in NSR Restarted xaralto  # hyperkalemia. Resolved.  # Dementia  Patient is following with Dr. Melrose Nakayama with neurology. She had significant inpatient delirium over dementia. Much improved Continue Ativan at bedtime  # diabetes mellitus Continue home medications.  # UTI On ciprofloxcin for 4 more days  Stable for discharge to skilled nursing facility.  CONSULTS OBTAINED:     DRUG ALLERGIES:   Allergies  Allergen Reactions  . Codeine Nausea And  Vomiting  . Penicillins Hives    Has patient had a PCN reaction causing immediate rash, facial/tongue/throat swelling, SOB or lightheadedness with hypotension: no  Has patient had a PCN reaction causing severe rash involving mucus membranes or skin necrosis: no  Has patient had a PCN reaction that required hospitalization: no  Has patient had a PCN reaction occurring within the last 10 years: no  If all of the above answers are "NO", then may proceed with Cephalosporin use.   . Sulfa Antibiotics   . Doxycycline Rash  . Erythromycin Rash  . Latex Rash    DISCHARGE MEDICATIONS:   Current Discharge Medication List    START taking these medications   Details  ciprofloxacin (CIPRO) 500 MG tablet Take 1 tablet (500 mg total) by mouth once. Qty: 9 tablet, Refills: 0    docusate sodium (COLACE) 100 MG capsule Take 1 capsule (100 mg total) by mouth 2 (two) times daily. Qty: 10 capsule, Refills: 0    feeding supplement, ENSURE ENLIVE, (ENSURE ENLIVE) LIQD Take 237 mLs by mouth 2 (two) times daily between meals. Qty: 237 mL, Refills: 12    ferrous sulfate 325 (65 FE) MG tablet Take 1 tablet (325 mg total) by mouth 2 (two) times daily with a meal. Refills: 3    LORazepam (ATIVAN) 1 MG tablet Take 1 tablet (1 mg total) by mouth at bedtime. Qty: 30 tablet, Refills: 0    oxyCODONE (OXY IR/ROXICODONE) 5 MG immediate release tablet Take 1 tablet (5 mg total) by mouth every 4 (four) hours as needed for severe pain ((for MODERATE breakthrough pain)). Qty: 30 tablet, Refills: 0      CONTINUE these medications which have NOT CHANGED   Details  albuterol (PROVENTIL HFA;VENTOLIN HFA) 108 (90 Base) MCG/ACT inhaler Inhale 2 puffs into the lungs 4 (four) times daily as needed for wheezing or shortness of breath.    albuterol (PROVENTIL) (2.5 MG/3ML) 0.083% nebulizer solution Take 2.5 mg by nebulization every 6 (six) hours as needed for wheezing or shortness of breath. Reported on 07/09/2015     amiodarone (PACERONE) 200 MG tablet Take 1 tablet (200 mg total) by mouth daily. Qty: 90 tablet, Refills: 1    JANUVIA 100 MG tablet Take 0.5 tablets (50 mg total) by mouth daily. Qty: 45 tablet, Refills: 1    memantine (NAMENDA) 5 MG tablet Take 5 mg by mouth 2 (two) times daily.    metFORMIN (GLUCOPHAGE-XR) 500 MG 24 hr tablet Take 1 tablet (500 mg total) by mouth daily before supper. Qty: 30 tablet, Refills: 1   Associated Diagnoses: Type 2 diabetes mellitus with stage 1 chronic kidney disease, without long-term current use of insulin (HCC)    metoprolol tartrate (LOPRESSOR) 25 MG tablet Take 25 mg by mouth 2 (two) times daily.    mometasone-formoterol (DULERA) 100-5 MCG/ACT AERO Inhale 2 puffs into the lungs  2 (two) times daily. Qty: 13 g, Refills: 5   Associated Diagnoses: COPD, severe (HCC)    Probiotic Product (ALIGN) 4 MG CAPS Take 1 capsule (4 mg total) by mouth daily. Qty: 90 capsule, Refills: 1    ranitidine (ZANTAC) 150 MG tablet Take 1 tablet (150 mg total) by mouth 2 (two) times daily. Qty: 180 tablet, Refills: 1    rivaroxaban (XARELTO) 20 MG TABS tablet Take 1 tablet (20 mg total) by mouth daily. Qty: 90 tablet, Refills: 1    senna (SENOKOT) 8.6 MG TABS tablet Take 1 tablet by mouth 2 (two) times daily.    tiotropium (SPIRIVA) 18 MCG inhalation capsule Place 1 capsule (18 mcg total) into inhaler and inhale daily. Qty: 90 capsule, Refills: 1        Today   VITAL SIGNS:  Blood pressure 153/59, pulse 80, temperature 99.7 F (37.6 C), temperature source Oral, resp. rate 22, height 5\' 4"  (1.626 m), weight 51.71 kg (114 lb), SpO2 100 %.  I/O:   Intake/Output Summary (Last 24 hours) at 11/10/15 1219 Last data filed at 11/10/15 [id number]  Gross per 24 hour  Intake      0 ml  Output    300 ml  Net   -300 ml    PHYSICAL EXAMINATION:  Physical Exam  GENERAL:  80 y.o.-year-old patient lying in the bed with no acute distress.  LUNGS: Normal breath sounds  bilaterally, no wheezing, rales,rhonchi or crepitation. No use of accessory muscles of respiration.  CARDIOVASCULAR: S1, S2 normal. No murmurs, rubs, or gallops.  ABDOMEN: Soft, non-tender, non-distended. Bowel sounds present. No organomegaly or mass.  NEUROLOGIC: Moves all 4 extremities. PSYCHIATRIC: The patient is alert and awake.  DATA REVIEW:   CBC  Recent Labs Lab 11/09/15 0322  WBC 6.8  HGB 9.5*  HCT 29.3*  PLT 206    Chemistries   Recent Labs Lab 11/04/15 1204  11/08/15 0712  NA 138  < > 139  K 5.0  < > 3.8  CL 100*  < > 102  CO2 31  < > 33*  GLUCOSE 191*  < > 203*  BUN 21*  < > 13  CREATININE 0.86  < > 0.65  CALCIUM 9.7  < > 8.3*  MG 1.8  --   --   AST 29  --   --   ALT 22  --   --   ALKPHOS 102  --   --   BILITOT 0.5  --   --   < > = values in this interval not displayed.  Cardiac Enzymes  Recent Labs Lab 11/04/15 1204  TROPONINI <0.03    Microbiology Results  Results for orders placed or performed during the hospital encounter of 11/04/15  Urine culture     Status: Abnormal   Collection Time: 11/05/15 11:59 AM  Result Value Ref Range Status   Specimen Description URINE, RANDOM  Final   Special Requests NONE  Final   Culture (A)  Final    >=100,000 COLONIES/mL AEROCOCCUS URINAE Standardized susceptibility testing for this organism is not available. Performed at [facility]    Report Status 11/07/2015 FINAL  Final    RADIOLOGY:  Dg Chest Port 1 View  11/09/2015  CLINICAL DATA:  Hypoxia EXAM: PORTABLE CHEST 1 VIEW COMPARISON:  11/04/2015 FINDINGS: Chronic hyperinflation correlating with COPD history. There is no edema, consolidation, effusion, or pneumothorax. Normal heart size and mediastinal contours. IMPRESSION: COPD without acute superimposed finding. Electronically Signed  By: Monte Fantasia M.D.   On: 11/09/2015 12:58    Follow up with PCP in 1 week.  Management plans discussed with the patient, family and they are in  agreement.  CODE STATUS:  Code Status History    Date Active Date Inactive Code Status Order ID Comments User Context   11/04/2015  5:49 PM 11/06/2015  1:32 PM Full Code [id number]  Thornton Park, MD Inpatient   11/04/2015  3:35 PM 11/04/2015  5:49 PM Full Code [id number]  Gladstone Lighter, MD Inpatient   07/01/2015  1:47 PM 07/03/2015  4:26 PM Full Code [id number]  Aldean Jewett, MD Inpatient   03/26/2015  4:59 PM 04/01/2015  6:34 PM Full Code [id number]  Earnestine Leys, MD Inpatient   03/26/2015  3:21 AM 03/26/2015  4:59 PM Full Code [id number]  Lance Coon, MD Inpatient   03/26/2015  2:35 AM 03/26/2015  3:21 AM Full Code [id number]  Earnestine Leys, MD ED      TOTAL TIME TAKING CARE OF THIS PATIENT ON DAY OF DISCHARGE: more than 30 minutes.   Hillary Bow R M.D on 11/10/2015 at 12:19 PM  Between 7am to 6pm - Pager - [phone]  After 6pm go to [email or web address] - password [email or web address] [facility]  Office  [phone]  CC: Primary care physician; Halina Maidens, MD  Note: This dictation was prepared with Dragon dictation along with smaller phrase technology. Any transcriptional errors that result from this process are unintentional.

## 2015-11-10 NOTE — Progress Notes (Signed)
Inpatient Diabetes Program Recommendations  AACE/ADA: New Consensus Statement on Inpatient Glycemic Control (2015)  Target Ranges:  Prepandial:   less than 140 mg/dL      Peak postprandial:   less than 180 mg/dL (1-2 hours)      Critically ill patients:  140 - 180 mg/dL   Results for YASHA, URENA (MRN [id number]) as of 11/10/2015 09:02  Ref. Range 11/08/2015 07:27 11/08/2015 11:23 11/08/2015 15:32 11/08/2015 22:23  Glucose-Capillary Latest Ref Range: 65-99 mg/dL 177 (H) 212 (H) 194 (H) 151 (H)   Results for TAYLORANNE, ZIMBELMAN (MRN [id number]) as of 11/10/2015 09:02  Ref. Range 11/09/2015 07:34 11/09/2015 11:26 11/09/2015 16:33 11/09/2015 20:43  Glucose-Capillary Latest Ref Range: 65-99 mg/dL 262 (H) 239 (H) 173 (H) 266 (H)   Results for KIMBRLY, FERWERDA (MRN [id number]) as of 11/10/2015 09:02  Ref. Range 11/10/2015 07:47  Glucose-Capillary Latest Ref Range: 65-99 mg/dL 198 (H)     Home DM Meds: Januvia 50 mg daily  Metformin 500 mg QPM  Current Insulin Orders: Tradjenta 5 mg daily   Novolog Sensitive Correction Scale/ SSI (0-9 units) TID AC + HS      MD- Note patient having issues with elevated fasting glucose levels. AM CBG today: 198 mg/dl.  Please consider starting Levemir 7 units daily (0.15 units/kg dosing) while home Metformin on hold.     --Will follow patient during hospitalization--  Wyn Quaker RN, MSN, CDE Diabetes Coordinator Inpatient Glycemic Control Team Team Pager: [phone] (8a-5p)

## 2015-11-10 NOTE — Progress Notes (Signed)
Patient is medically stable for D/C to [facility] today. Per Bill admissions coordinator at [facility] patient will go to room E-17. RN will call report and arrange EMS for transport. Clinical Education officer, museum (CSW) sent D/C Summary, FL2 and D/C Packet to [facility] via [employer]. Patient's husband Mr. Rysavy was at bedside and aware of D/C today. Please reconsult if future social work needs arise. CSW signing off.   Blima Rich, LCSW [phone]

## 2015-11-10 NOTE — Progress Notes (Signed)
Physical Therapy Treatment Patient Details Name: Arlesia Zaccaria MRN: [id number] DOB: 18-Jan-1936 Today's Date: 11/10/2015    History of Present Illness Rosene Arranaga is a 80 y.o. female with a known history of Multiple medical problems including diabetes mellitus, COPD not on home oxygen, SLE, Raynaud, rheumatic fever, hypertension, peptic ulcer disease, paroxysmal atrial fibrillation on xarelto, recent admission for fall and left hip fracture in October 2016; She is admitted after a fall and right femoral neck fracture. Patient is s/p right hemiarthroplasty on 11/06/15; She is PWB in RLE and has posterior hip precautions. OT Evaluation not completed due to decreased arousal/alertness. PT and spouse report pt unable to wake up after receiving medications this am. Will attempt to eval at later date when pt is more appropriate.    PT Comments    Patient requesting to transfer to [facility], initially she is somewhat lethargic until after transfer to [facility] after which she becomes more alert and responsive. She requires heavy verbal cuing, and is quite anxious with weight bearing and advancing her RLE. She does increase her gait distance after time on [facility], though fatigues quite quickly. She is progressing slowly towards her goals, it appears due to both pain/weakness and alertness.   Follow Up Recommendations  SNF     Equipment Recommendations  None recommended by PT    Recommendations for Other Services       Precautions / Restrictions Precautions Precautions: Posterior Hip Precaution Comments: RLE Restrictions Weight Bearing Restrictions: Yes RLE Weight Bearing: Partial weight bearing Other Position/Activity Restrictions: patient on posterior hip precautions for RLE    Mobility  Bed Mobility Overal bed mobility: Needs Assistance Bed Mobility: Supine to Sit     Supine to sit: Mod assist     General bed mobility comments: Patient requires near total assist for RLE and trunk to bring  over edge of bed.   Transfers Overall transfer level: Needs assistance Equipment used: Rolling walker (2 wheeled) Transfers: Sit to/from Stand Sit to Stand: Min assist;Mod assist;+2 safety/equipment         General transfer comment: Patient demonstrates poor hand placement and sequencing, PT assisted physically though once midway through transfer patient becomes much more independent.   Ambulation/Gait Ambulation/Gait assistance: Mod assist;+2 physical assistance Ambulation Distance (Feet): 8 Feet Assistive device: Rolling walker (2 wheeled) Gait Pattern/deviations: Step-to pattern;Decreased step length - left;Decreased step length - right;Shuffle;Decreased stance time - right   Gait velocity interpretation: Below normal speed for age/gender General Gait Details: Patient requires significant cuing and PT assistance to advance RLE. She is able to take several longer steps with heavy encouragement, but ultimately fatigues rather quickly.    Stairs            Wheelchair Mobility    Modified Rankin (Stroke Patients Only)       Balance Overall balance assessment: Needs assistance;History of Falls Sitting-balance support: Bilateral upper extremity supported;Feet supported Sitting balance-Leahy Scale: Fair   Postural control: Posterior lean Standing balance support: Bilateral upper extremity supported Standing balance-Leahy Scale: Poor                      Cognition Arousal/Alertness: Awake/alert Behavior During Therapy: Flat affect Overall Cognitive Status: Impaired/Different from baseline (Patient initially somewhat delayed/groggy with responses, slow to follow commands with sitting. )                      Exercises Total Joint Exercises Ankle Circles/Pumps: AROM;5 reps;Both Long Arc Quad: AAROM;Both;10  reps Other Exercises Other Exercises: Assisted patient to bedside commode for toilet transfer with cuing for hand placement, turning with RW,  required mod-max A throughout transfer for physical balance assistance, hand placement, sequencing.     General Comments        Pertinent Vitals/Pain Pain Assessment: Faces Faces Pain Scale: Hurts little more Pain Location: R hip Pain Descriptors / Indicators: Aching;Operative site guarding Pain Intervention(s): Limited activity within patient's tolerance;Monitored during session;Repositioned    Home Living                      Prior Function            PT Goals (current goals can now be found in the care plan section) Acute Rehab PT Goals Patient Stated Goal: To get better. PT Goal Formulation: With patient Time For Goal Achievement: 11/21/15 Potential to Achieve Goals: Good Progress towards PT goals: Progressing toward goals    Frequency  BID    PT Plan Current plan remains appropriate    Co-evaluation             End of Session Equipment Utilized During Treatment: Gait belt Activity Tolerance: Patient limited by fatigue;Patient limited by lethargy Patient left: in chair;with call bell/phone within reach;with chair alarm set     Time: [id number] PT Time Calculation (min) (ACUTE ONLY): 30 min  Charges:  $Gait Training: 8-22 mins $Therapeutic Activity: 8-22 mins (Assisted with transfer to [facility] )                    G Codes:      Kerman Passey, PT, DPT    11/10/2015, 2:10 PM

## 2015-11-10 NOTE — Progress Notes (Signed)
EMS here to transport pt. 

## 2015-11-10 NOTE — Progress Notes (Signed)
DISCHARGE NOTE:  Report called to [place] at [facility]. EMS called for transportation.

## 2015-11-11 ENCOUNTER — Encounter: Payer: Self-pay | Admitting: Orthopedic Surgery

## 2015-11-13 LAB — URINE CULTURE: Culture: >=100000 — AB

## 2015-11-15 ENCOUNTER — Ambulatory Visit: Payer: Medicare Other | Admitting: Internal Medicine

## 2015-11-16 DIAGNOSIS — J449 Chronic obstructive pulmonary disease, unspecified: Secondary | ICD-10-CM | POA: Diagnosis not present

## 2015-11-16 DIAGNOSIS — I10 Essential (primary) hypertension: Secondary | ICD-10-CM | POA: Diagnosis not present

## 2015-11-16 DIAGNOSIS — F0390 Unspecified dementia without behavioral disturbance: Secondary | ICD-10-CM | POA: Diagnosis not present

## 2015-11-16 DIAGNOSIS — S72001D Fracture of unspecified part of neck of right femur, subsequent encounter for closed fracture with routine healing: Secondary | ICD-10-CM | POA: Diagnosis not present

## 2015-11-16 DIAGNOSIS — I4891 Unspecified atrial fibrillation: Secondary | ICD-10-CM | POA: Diagnosis not present

## 2015-11-16 DIAGNOSIS — K279 Peptic ulcer, site unspecified, unspecified as acute or chronic, without hemorrhage or perforation: Secondary | ICD-10-CM | POA: Diagnosis not present

## 2015-11-16 DIAGNOSIS — Z7901 Long term (current) use of anticoagulants: Secondary | ICD-10-CM | POA: Diagnosis not present

## 2015-11-16 DIAGNOSIS — E119 Type 2 diabetes mellitus without complications: Secondary | ICD-10-CM | POA: Diagnosis not present

## 2015-11-24 DIAGNOSIS — L97312 Non-pressure chronic ulcer of right ankle with fat layer exposed: Secondary | ICD-10-CM | POA: Diagnosis not present

## 2015-11-24 DIAGNOSIS — L89610 Pressure ulcer of right heel, unstageable: Secondary | ICD-10-CM | POA: Diagnosis not present

## 2015-11-24 DIAGNOSIS — Z96641 Presence of right artificial hip joint: Secondary | ICD-10-CM | POA: Diagnosis not present

## 2015-11-27 DIAGNOSIS — L89890 Pressure ulcer of other site, unstageable: Secondary | ICD-10-CM | POA: Diagnosis not present

## 2015-11-27 DIAGNOSIS — I5032 Chronic diastolic (congestive) heart failure: Secondary | ICD-10-CM | POA: Diagnosis not present

## 2015-11-27 DIAGNOSIS — Z9981 Dependence on supplemental oxygen: Secondary | ICD-10-CM | POA: Diagnosis not present

## 2015-11-27 DIAGNOSIS — F419 Anxiety disorder, unspecified: Secondary | ICD-10-CM | POA: Diagnosis not present

## 2015-11-27 DIAGNOSIS — E1142 Type 2 diabetes mellitus with diabetic polyneuropathy: Secondary | ICD-10-CM | POA: Diagnosis not present

## 2015-11-27 DIAGNOSIS — L89620 Pressure ulcer of left heel, unstageable: Secondary | ICD-10-CM | POA: Diagnosis not present

## 2015-11-27 DIAGNOSIS — F0390 Unspecified dementia without behavioral disturbance: Secondary | ICD-10-CM | POA: Diagnosis not present

## 2015-11-27 DIAGNOSIS — J449 Chronic obstructive pulmonary disease, unspecified: Secondary | ICD-10-CM | POA: Diagnosis not present

## 2015-11-27 DIAGNOSIS — Z87891 Personal history of nicotine dependence: Secondary | ICD-10-CM | POA: Diagnosis not present

## 2015-11-27 DIAGNOSIS — M329 Systemic lupus erythematosus, unspecified: Secondary | ICD-10-CM | POA: Diagnosis not present

## 2015-11-27 DIAGNOSIS — S72001D Fracture of unspecified part of neck of right femur, subsequent encounter for closed fracture with routine healing: Secondary | ICD-10-CM | POA: Diagnosis not present

## 2015-11-27 DIAGNOSIS — I480 Paroxysmal atrial fibrillation: Secondary | ICD-10-CM | POA: Diagnosis not present

## 2015-11-27 DIAGNOSIS — L89610 Pressure ulcer of right heel, unstageable: Secondary | ICD-10-CM | POA: Diagnosis not present

## 2015-12-01 ENCOUNTER — Telehealth: Payer: Self-pay

## 2015-12-01 DIAGNOSIS — I5032 Chronic diastolic (congestive) heart failure: Secondary | ICD-10-CM | POA: Diagnosis not present

## 2015-12-01 DIAGNOSIS — L89610 Pressure ulcer of right heel, unstageable: Secondary | ICD-10-CM | POA: Diagnosis not present

## 2015-12-01 DIAGNOSIS — L89890 Pressure ulcer of other site, unstageable: Secondary | ICD-10-CM | POA: Diagnosis not present

## 2015-12-01 DIAGNOSIS — S72001D Fracture of unspecified part of neck of right femur, subsequent encounter for closed fracture with routine healing: Secondary | ICD-10-CM | POA: Diagnosis not present

## 2015-12-01 DIAGNOSIS — F0390 Unspecified dementia without behavioral disturbance: Secondary | ICD-10-CM | POA: Diagnosis not present

## 2015-12-01 DIAGNOSIS — L89620 Pressure ulcer of left heel, unstageable: Secondary | ICD-10-CM | POA: Diagnosis not present

## 2015-12-01 NOTE — Telephone Encounter (Signed)
Last OV in April. Left message but do you agree they need OV?

## 2015-12-01 NOTE — Telephone Encounter (Signed)
She has already been seeing the wound care center.  Not sure what he really asking for.

## 2015-12-01 NOTE — Telephone Encounter (Signed)
Patient's husband would like a referral to wound care for right heel/leg and left leg. Patient's husband would like you to return his phone call.

## 2015-12-02 ENCOUNTER — Encounter: Payer: Medicare Other | Attending: Surgery | Admitting: Surgery

## 2015-12-02 DIAGNOSIS — I480 Paroxysmal atrial fibrillation: Secondary | ICD-10-CM | POA: Diagnosis not present

## 2015-12-02 DIAGNOSIS — N189 Chronic kidney disease, unspecified: Secondary | ICD-10-CM | POA: Insufficient documentation

## 2015-12-02 DIAGNOSIS — L89613 Pressure ulcer of right heel, stage 3: Secondary | ICD-10-CM | POA: Diagnosis not present

## 2015-12-02 DIAGNOSIS — F419 Anxiety disorder, unspecified: Secondary | ICD-10-CM | POA: Insufficient documentation

## 2015-12-02 DIAGNOSIS — E1122 Type 2 diabetes mellitus with diabetic chronic kidney disease: Secondary | ICD-10-CM | POA: Insufficient documentation

## 2015-12-02 DIAGNOSIS — L97211 Non-pressure chronic ulcer of right calf limited to breakdown of skin: Secondary | ICD-10-CM | POA: Diagnosis not present

## 2015-12-02 DIAGNOSIS — J449 Chronic obstructive pulmonary disease, unspecified: Secondary | ICD-10-CM | POA: Insufficient documentation

## 2015-12-02 DIAGNOSIS — Z87891 Personal history of nicotine dependence: Secondary | ICD-10-CM | POA: Insufficient documentation

## 2015-12-02 DIAGNOSIS — E11621 Type 2 diabetes mellitus with foot ulcer: Secondary | ICD-10-CM | POA: Insufficient documentation

## 2015-12-02 DIAGNOSIS — I509 Heart failure, unspecified: Secondary | ICD-10-CM | POA: Diagnosis not present

## 2015-12-02 DIAGNOSIS — L97821 Non-pressure chronic ulcer of other part of left lower leg limited to breakdown of skin: Secondary | ICD-10-CM | POA: Diagnosis not present

## 2015-12-02 DIAGNOSIS — F0390 Unspecified dementia without behavioral disturbance: Secondary | ICD-10-CM | POA: Diagnosis not present

## 2015-12-02 DIAGNOSIS — L97212 Non-pressure chronic ulcer of right calf with fat layer exposed: Secondary | ICD-10-CM | POA: Diagnosis not present

## 2015-12-02 DIAGNOSIS — Z7901 Long term (current) use of anticoagulants: Secondary | ICD-10-CM | POA: Diagnosis not present

## 2015-12-02 DIAGNOSIS — M329 Systemic lupus erythematosus, unspecified: Secondary | ICD-10-CM | POA: Insufficient documentation

## 2015-12-02 DIAGNOSIS — R339 Retention of urine, unspecified: Secondary | ICD-10-CM | POA: Diagnosis not present

## 2015-12-02 DIAGNOSIS — L97811 Non-pressure chronic ulcer of other part of right lower leg limited to breakdown of skin: Secondary | ICD-10-CM | POA: Diagnosis not present

## 2015-12-02 NOTE — Telephone Encounter (Signed)
Can not reach but Dr. B said they already follow wound care. Please get more info on what they need.

## 2015-12-03 DIAGNOSIS — L89610 Pressure ulcer of right heel, unstageable: Secondary | ICD-10-CM | POA: Diagnosis not present

## 2015-12-03 DIAGNOSIS — S72001D Fracture of unspecified part of neck of right femur, subsequent encounter for closed fracture with routine healing: Secondary | ICD-10-CM | POA: Diagnosis not present

## 2015-12-03 DIAGNOSIS — I5032 Chronic diastolic (congestive) heart failure: Secondary | ICD-10-CM | POA: Diagnosis not present

## 2015-12-03 DIAGNOSIS — L89620 Pressure ulcer of left heel, unstageable: Secondary | ICD-10-CM | POA: Diagnosis not present

## 2015-12-03 DIAGNOSIS — L89890 Pressure ulcer of other site, unstageable: Secondary | ICD-10-CM | POA: Diagnosis not present

## 2015-12-03 DIAGNOSIS — F0390 Unspecified dementia without behavioral disturbance: Secondary | ICD-10-CM | POA: Diagnosis not present

## 2015-12-03 NOTE — Progress Notes (Signed)
Madison, [name] ([id number]) Visit Report for 12/02/2015 Chief Complaint Document Details Patient Name: [name], [name] 12/02/2015 3:00 Date of Service: PM Medical Record [id number] Number: Patient Account Number: [id number] 30-Aug-1935 (80 y.o. Treating RN: Alanda Amass Date of Birth/Sex: Female) Other Clinician: Primary Care Physician: Halina Maidens Treating [name] Referring Physician: Halina Maidens Physician/Extender: Suella Grove in Treatment: 0 Information Obtained from: Patient Chief Complaint Patient is at the clinic for treatment of an open pressure ulcer to her right calcaneum which has been there for about 4 weeks Electronic Signature(s) Signed: 12/02/2015 4:49:29 PM By: [name], FACS Entered By: [name] on 12/02/2015 16:49:28 [name] ([id number]) -------------------------------------------------------------------------------- HPI Details Patient Name: [name], [name] 12/02/2015 3:00 Date of Service: PM Medical Record [id number] Number: Patient Account Number: [id number] 1935/07/30 (80 y.o. Treating RN: Alanda Amass Date of Birth/Sex: Female) Other Clinician: Primary Care Physician: Halina Maidens Treating [name] Referring Physician: Halina Maidens Physician/Extender: Suella Grove in Treatment: 0 History of Present Illness Location: right posterior heel, and both lower extremities Quality: Patient reports experiencing a sharp pain to affected area(s). Severity: Patient states wound (s) are getting better. Duration: Patient has had the wound for < 4 weeks prior to presenting for treatment Timing: Pain in wound is Intermittent (comes and goes Context: The wound appeared gradually over time while she was in hospital with a right hip fracture Modifying Factors: Consults to this date include:was seen by her PCP and put on some antibiotics a while ago Associated Signs and Symptoms: Patient reports having difficulty standing for long  periods. HPI Description: 80 year old patient referred who was recently discharged in April of this year with a left heel ulcer now comes with a right heel ulcer and some ulcerations in both lower extremities which have all come along during her recent hospitalization which she had a right hip fracture. She has a past medical history of COPD, diabetes mellitus type 2 with chronic kidney disease, multi-infarct dementia, anxiety, urinary retention, paroxysmal atrial fibrillation. She has also had moderate malnutrition, multi-infarct dementia, congestive heart failure, left displaced femoral neck fracture, systemic lupus erythematosus. She is also status total abdominal hysterectomy with bilateral salpingo-oophorectomy, EGDs, hip arthroplasty on the left, cataract surgery. She has been a from a smoker and quit in May 2013 and she smoked for about 40 years. recently admitted to [facility] between January 19 and 07/03/2015. Electronic Signature(s) Signed: 12/02/2015 4:53:02 PM By: [name], FACS Previous Signature: 12/02/2015 4:51:14 PM Version By: [name], FACS Entered By: [name] on 12/02/2015 16:53:02 [name] ([id number]) -------------------------------------------------------------------------------- Physical Exam Details Patient Name: [name], [name] 12/02/2015 3:00 Date of Service: PM Medical Record [id number] Number: Patient Account Number: [id number] 09/21/1935 (80 y.o. Treating RN: Alanda Amass Date of Birth/Sex: Female) Other Clinician: Primary Care Physician: Halina Maidens Treating [name] Referring Physician: Halina Maidens Physician/Extender: Suella Grove in Treatment: 0 Constitutional . Pulse regular. Respirations normal and unlabored. Afebrile. . Eyes Nonicteric. Reactive to light. Ears, Nose, Mouth, and Throat Lips, teeth, and gums WNL.Marland Kitchen Moist mucosa without lesions. Neck supple and nontender. No palpable  supraclavicular or cervical adenopathy. Normal sized without goiter. Respiratory WNL. No retractions.. Cardiovascular . Pedal Pulses WNL. left ABI was 0.64 and right was 0.56. No clubbing, cyanosis or edema. Chest Breasts symmetical and no nipple discharge.. Breast tissue WNL, no masses, lumps, or tenderness.. Gastrointestinal (GI) Abdomen without masses or tenderness.. No liver or spleen enlargement or tenderness.. Lymphatic No adneopathy. No adenopathy. No adenopathy. Musculoskeletal Adexa without  tenderness or enlargement.. Digits and nails w/o clubbing, cyanosis, infection, petechiae, ischemia, or inflammatory conditions.. Integumentary (Hair, Skin) No suspicious lesions. No crepitus or fluctuance. No peri-wound warmth or erythema. No masses.Marland Kitchen Psychiatric Judgement and insight Intact.. No evidence of depression, anxiety, or agitation.. Notes the patient has a stage III pressure injury to the right heel and some of it is covered with thick eschar which is tender. On the right lower extremity she's got some areas which may have been bruises or lacerations but they are now superficial ulcers down to the subcutaneous tissue. On the left lower extremity she's got a superficial ulcer which is only breakdown of skin. No debridement was possible today. Electronic Signature(s) Signed: 12/02/2015 4:54:19 PM By: [name], FACS [name] ([id number]) Entered By: [name] on 12/02/2015 16:54:19 [name] ([id number]) -------------------------------------------------------------------------------- Physician Orders Details Patient Name: [name], [name] 12/02/2015 3:00 Date of Service: PM Medical Record [id number] Number: Patient Account Number: [id number] 1935-10-14 (80 y.o. Treating RN: Alanda Amass Date of Birth/Sex: Female) Other Clinician: Primary Care Physician: Halina Maidens Treating [name] Referring Physician: Halina Maidens Physician/Extender: Suella Grove in Treatment: 0 Verbal / Phone Orders: Yes Clinician: Alanda Amass Read Back and Verified: Yes Diagnosis Coding Wound Cleansing Wound #2 Left,Anterior Lower Leg o Clean wound with Normal Saline. - in clinic o Cleanse wound with mild soap and water Wound #3 Right,Anterior Lower Leg o Clean wound with Normal Saline. - in clinic o Cleanse wound with mild soap and water Wound #4 Right,Medial Lower Leg o Clean wound with Normal Saline. - in clinic o Cleanse wound with mild soap and water Wound #5 Right,Posterior Calcaneus o Clean wound with Normal Saline. - in clinic o Cleanse wound with mild soap and water Anesthetic Wound #2 Left,Anterior Lower Leg o Topical Lidocaine 4% cream applied to wound bed prior to debridement Wound #3 Right,Anterior Lower Leg o Topical Lidocaine 4% cream applied to wound bed prior to debridement Wound #4 Right,Medial Lower Leg o Topical Lidocaine 4% cream applied to wound bed prior to debridement Wound #5 Right,Posterior Calcaneus o Topical Lidocaine 4% cream applied to wound bed prior to debridement Primary Wound Dressing Wound #2 Left,Anterior Lower Leg o Boardered Foam Dressing Wound #3 Right,Anterior Lower Leg KHERINGTON, ESKER ([id number]) o Santyl Ointment Wound #4 Right,Medial Lower Leg o Santyl Ointment Wound #5 Right,Posterior Calcaneus o Santyl Ointment Secondary Dressing Wound #3 Right,Anterior Lower Leg o Boardered Foam Dressing Wound #4 Right,Medial Lower Leg o Boardered Foam Dressing Wound #5 Right,Posterior Calcaneus o Dry Gauze o Foam - foam heel cup Dressing Change Frequency Wound #2 Left,Anterior Lower Leg o Change dressing every day. Wound #3 Right,Anterior Lower Leg o Change dressing every day. Wound #4 Right,Medial Lower Leg o Change dressing every day. Wound #5 Right,Posterior Calcaneus o Change dressing every day. Follow-up  Appointments o Return Appointment in 1 week. Off-Loading o Other: - float heels with pillow under calves when in bed; wear pressure relieving boots while in bed Radiology o X-ray, right foot - x ray rt foot; non-healing DFU Services and Therapies o Arterial Studies- Bilateral with doppler - Bilateral arterial studies with doppler; Abnormal ABIs in clinic Patient Medications Allergies: codeine, penicillin, Sulfa (Sulfonamide Antibiotics), doxycycline, erythromycin base, latex OWYN, WILBUR ([id number]) Notifications Medication Indication Start End Santyl 12/02/2015 DOSE topical 250 unit/gram ointment - ointment topical as directed Electronic Signature(s) Signed: 12/02/2015 4:52:21 PM By: [name], FACS Entered By: [name] on 12/02/2015 16:52:21 [name] ([id number]) -------------------------------------------------------------------------------- Prescription  12/02/2015 Patient Name: [name], BREVIG. Physician: [name] Date of Birth: 1935/11/14 NPI#: [id number] Sex: F DEA#: [id number] Phone #: [id number] License #: Patient Address: [place], [place] [address] [facility] [address], [place], [place] [address] [phone] Allergies codeine penicillin Sulfa (Sulfonamide Antibiotics) doxycycline erythromycin base latex Physician's Orders o X-ray, right foot - x ray rt foot; non-healing DFU Signature(s): Date(s): Electronic Signature(s) Signed: 12/02/2015 4:59:18 PM By: [name], FACS Entered By: [name] on 12/02/2015 16:52:22 [name] ([name] ([id number]) --------------------------------------------------------------------------------  Problem List Details Patient Name: [name], [name] 12/02/2015 3:00 Date of Service: PM Medical Record [id number] Number: Patient Account Number:  [id number] [birth year] (81 y.o. 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 layer 12/02/2015 Yes exposed L97.211 Non-pressure chronic ulcer of right calf limited to 12/02/2015 Yes breakdown of skin Z79.01 Long term (current) use of anticoagulants 12/02/2015 Yes Inactive Problems Resolved Problems Electronic Signature(s) Signed: 12/02/2015 4:48:54 PM By: [name], FACS Entered By: [name] on 12/02/2015 16:48:54 [name] ([id number]) -------------------------------------------------------------------------------- Progress Note Details Patient Name: [name] 12/02/2015 3:00 Date of Service: PM Medical Record [id number] Number: Patient Account Number: [id number] 1936-01-11 (80 y.o. Treating RN: Alanda Amass Date of Birth/Sex: Female) Other Clinician: Primary Care Physician: Halina Maidens Treating [name] Referring Physician: Halina Maidens Physician/Extender: Suella Grove in Treatment: 0 Subjective Chief Complaint Information obtained from Patient Patient is at the clinic for treatment of an open pressure ulcer to her right calcaneum which has been there for about 4 weeks History of Present Illness (HPI) The following HPI elements were documented for the patient's wound: Location: right posterior heel, and both lower extremities Quality: Patient reports experiencing a sharp pain to affected area(s). Severity: Patient states wound (s) are getting better. Duration: Patient has had the wound for < 4 weeks prior to presenting for  treatment Timing: Pain in wound is Intermittent (comes and goes Context: The wound appeared gradually over time while she was in hospital with a right hip fracture Modifying Factors: Consults to this date include:was seen by her PCP and put on some antibiotics a while ago Associated Signs and Symptoms: Patient reports having difficulty standing for long periods. 80 year old patient referred who was recently discharged in April of this year with a left heel ulcer now comes with a right heel ulcer and some ulcerations in both lower extremities which have all come along during her recent hospitalization which she had a right hip fracture. She has a past medical history of COPD, diabetes mellitus type 2 with chronic kidney disease, multi-infarct dementia, anxiety, urinary retention, paroxysmal atrial fibrillation. She has also had moderate malnutrition, multi-infarct dementia, congestive heart failure, left displaced femoral neck fracture, systemic lupus erythematosus. She is also status total abdominal hysterectomy with bilateral salpingo-oophorectomy, EGDs, hip arthroplasty on the left, cataract surgery. She has been a from a smoker and quit in May 2013 and she smoked for about 40 years. recently admitted to [facility] between January 19 and 07/03/2015. Wound History Patient presents with 1 open wound. Patient has been treating wound  in the following manner: dry dressing. Laboratory tests have not been performed in the last month. Patient reportedly has not tested positive for an antibiotic resistant organism. Patient reportedly has not tested positive for osteomyelitis. Patient reportedly has not had testing performed to evaluate circulation in the legs. Patient experiences the following problems associated with their wounds: swelling. NAYRA, [name] ([id number]) Patient History Information obtained from Patient. Allergies codeine, penicillin, Sulfa (Sulfonamide  Antibiotics), doxycycline, erythromycin base, latex Social History Former smoker - 4 years ago, Marital Status - Married, Alcohol Use - Never, Drug Use - No History, Caffeine Use - Never. Medical History Eyes Denies history of Cataracts, Glaucoma, Optic Neuritis Ear/Nose/Mouth/Throat Denies history of Chronic sinus problems/congestion, Middle ear problems Hematologic/Lymphatic Denies history of Anemia, Hemophilia, Human Immunodeficiency Virus, Lymphedema, Sickle Cell Disease Gastrointestinal Denies history of Cirrhosis , Colitis, Crohn s, Hepatitis A, Hepatitis B, Hepatitis C Genitourinary Denies history of End Stage Renal Disease Integumentary (Skin) Denies history of History of Burn Musculoskeletal Patient has history of Osteoarthritis Denies history of Gout, Rheumatoid Arthritis, Osteomyelitis Neurologic Patient has history of Dementia - infarct Denies history of Neuropathy, Quadriplegia, Paraplegia, Seizure Disorder Oncologic Denies history of Received Chemotherapy, Received Radiation Psychiatric Denies history of Anorexia/bulimia, Confinement Anxiety Medical And Surgical History Notes Neurologic raynauds Review of Systems (ROS) Constitutional Symptoms (General Health) Complains or has symptoms of Fatigue. Eyes The patient has no complaints or symptoms. Ear/Nose/Mouth/Throat The patient has no complaints or symptoms. Hematologic/Lymphatic The patient has no complaints or symptoms. Respiratory The patient has no complaints or symptoms. KENITHA, BRUMMETT ([id number]) Cardiovascular The patient has no complaints or symptoms. Gastrointestinal The patient has no complaints or symptoms. Genitourinary The patient has no complaints or symptoms. Immunological The patient has no complaints or symptoms. Integumentary (Skin) Complains or has symptoms of Wounds, Breakdown. Musculoskeletal The patient has no complaints or symptoms. Neurologic The patient has no complaints  or symptoms. Oncologic The patient has no complaints or symptoms. Psychiatric The patient has no complaints or symptoms. Medications Santyl 250 unit/gram topical ointment topical ointment topical as directed I reviewed a list of her other medications which includes albuterol nebulizer, Pacerone, Januvia, Durezol, Align, Zantac, Xarelto, Flomax, Spiriva, Lopressor. Objective Constitutional Pulse regular. Respirations normal and unlabored. Afebrile. Vitals Time Taken: 3:22 PM, Height: 64 in, Source: Stated, Weight: 110 lbs, Source: Stated, BMI: 18.9, Temperature: 98.2 F, Pulse: 60 bpm, Respiratory Rate: 16 breaths/min, Blood Pressure: 125/53 mmHg. General Notes: Pt/cg state fasting BG= 222 this am. Eyes Nonicteric. Reactive to light. Ears, Nose, Mouth, and Throat Lips, teeth, and gums WNL.Marland Kitchen Moist mucosa without lesions. Neck supple and nontender. No palpable supraclavicular or cervical adenopathy. Normal sized without goiter. [name] ([id number]) Respiratory WNL. No retractions.. Cardiovascular Pedal Pulses WNL. left ABI was 0.64 and right was 0.56. No clubbing, cyanosis or edema. Chest Breasts symmetical and no nipple discharge.. Breast tissue WNL, no masses, lumps, or tenderness.. Gastrointestinal (GI) Abdomen without masses or tenderness.. No liver or spleen enlargement or tenderness.. Lymphatic No adneopathy. No adenopathy. No adenopathy. Musculoskeletal Adexa without tenderness or enlargement.. Digits and nails w/o clubbing, cyanosis, infection, petechiae, ischemia, or inflammatory conditions.Marland Kitchen Psychiatric Judgement and insight Intact.. No evidence of depression, anxiety, or agitation.. General Notes: the patient has a stage III pressure injury to the right heel and some of it is covered with thick eschar which is tender. On the right lower extremity she's got some areas which may have been bruises or lacerations but they are now superficial ulcers down to the  subcutaneous tissue. On the left lower extremity she's got a superficial ulcer which is only breakdown of skin. No debridement was possible today. Integumentary (Hair, Skin) No suspicious lesions. No crepitus or fluctuance. No peri-wound warmth or erythema. No masses.. Wound #2 status is Open. Original cause of wound was Gradually Appeared. The wound is located on the Left,Anterior Lower Leg. The wound measures 0.3cm length x 0.3cm width x 0.1cm depth; 0.071cm^2 area and 0.007cm^3 volume. The wound is limited to skin breakdown. There is no tunneling or undermining noted. There is a medium amount of serosanguineous drainage noted. The wound margin is distinct with the outline attached to the wound base. There is large (67-100%) granulation within the wound bed. There is no necrotic tissue within the wound bed. The periwound skin appearance exhibited: Localized Edema, Moist. The periwound skin appearance did not exhibit: Callus, Crepitus, Excoriation, Fluctuance, Friable, Induration, Rash, Scarring, Dry/Scaly, Maceration, Atrophie Blanche, Cyanosis, Ecchymosis, Hemosiderin Staining, Mottled, Pallor, Rubor, Erythema. Periwound temperature was noted as No Abnormality. Wound #3 status is Open. Original cause of wound was Gradually Appeared. The wound is located on the Right,Anterior Lower Leg. The wound measures 0.9cm length x 0.6cm width x 0.1cm depth; 0.424cm^2 area and 0.042cm^3 volume. The wound is limited to skin breakdown. There is no tunneling or undermining noted. There is a medium amount of serosanguineous drainage noted. The wound margin is distinct with the outline attached to the wound base. There is small (1-33%) pink, pale granulation within the wound bed. There is a large (67-100%) amount of necrotic tissue within the wound bed including Adherent Slough. The periwound skin appearance exhibited: Localized Edema, Moist. The periwound skin appearance did not exhibit: Callus, Crepitus,  Excoriation, Fluctuance, Friable, Induration, Rash, Scarring, Dry/Scaly, Maceration, Atrophie Blanche, Cyanosis, Ecchymosis, Hemosiderin Staining, Mottled, Pallor, Rubor, ESMI, EVERTSEN. ([id number]) Erythema. Periwound temperature was noted as No Abnormality. Wound #4 status is Open. Original cause of wound was Gradually Appeared. The wound is located on the Right,Medial Lower Leg. The wound measures 1.2cm length x 2.6cm width x 0.1cm depth; 2.45cm^2 area and 0.245cm^3 volume. The wound is limited to skin breakdown. There is no tunneling or undermining noted. There is a medium amount of serosanguineous drainage noted. The wound margin is distinct with the outline attached to the wound base. There is small (1-33%) pink, pale granulation within the wound bed. There is a large (67-100%) amount of necrotic tissue within the wound bed including Adherent Slough. The periwound skin appearance exhibited: Localized Edema, Moist. The periwound skin appearance did not exhibit: Callus, Crepitus, Excoriation, Fluctuance, Friable, Induration, Rash, Scarring, Dry/Scaly, Maceration, Atrophie Blanche, Cyanosis, Ecchymosis, Hemosiderin Staining, Mottled, Pallor, Rubor, Erythema. Periwound temperature was noted as No Abnormality. Wound #5 status is Open. Original cause of wound was Gradually Appeared. The wound is located on the Right,Posterior Calcaneus. The wound measures 3.8cm length x 3.2cm width x 0.1cm depth; 9.55cm^2 area and 0.955cm^3 volume. The wound is limited to skin breakdown. There is no tunneling or undermining noted. There is a medium amount of serosanguineous drainage noted. The wound margin is distinct with the outline attached to the wound base. There is no granulation within the wound bed. There is a large (67- 100%) amount of necrotic tissue within the wound bed including Eschar and Adherent Slough. The periwound skin appearance exhibited: Moist, Mottled. The periwound skin appearance did not  exhibit: Callus, Crepitus, Excoriation, Fluctuance, Friable, Induration, Localized Edema, Rash, Scarring, Dry/Scaly, Maceration, Atrophie Blanche, Cyanosis, Ecchymosis, Hemosiderin Staining, Pallor, Rubor, Erythema. Periwound temperature was  noted as No Abnormality. Assessment Active Problems ICD-10 E11.621 - Type 2 diabetes mellitus with foot ulcer L89.613 - Pressure ulcer of right heel, stage 3 L97.212 - Non-pressure chronic ulcer of right calf with fat layer exposed L97.211 - Non-pressure chronic ulcer of right calf limited to breakdown of skin Z79.01 - Long term (current) use of anticoagulants this 80 year old patient who is a diabetic now has a stage III pressure ulcer of the right heel after recent hip fracture and some associated lower extremity ulcerations. I have recommended: 1. Santyl ointment to the wounds on the right heel and the right lower extremity to be covered with a bordered foam. ANACELIA, MEIKLE. ([id number]) 2. left lower extremity wound just to be covered with a bordered foam 3. Arterial duplex studies of both lower extremities as her ABI has been low during this visit 4. Good control of her diabetes 5. Adequate nutrition with protein supplements, vitamin A, vitamin C and zinc 6. Regular visits to the wound center The patient and her husband have read all questions answered and will be compliant Plan Wound Cleansing: Wound #2 Left,Anterior Lower Leg: Clean wound with Normal Saline. - in clinic Cleanse wound with mild soap and water Wound #3 Right,Anterior Lower Leg: Clean wound with Normal Saline. - in clinic Cleanse wound with mild soap and water Wound #4 Right,Medial Lower Leg: Clean wound with Normal Saline. - in clinic Cleanse wound with mild soap and water Wound #5 Right,Posterior Calcaneus: Clean wound with Normal Saline. - in clinic Cleanse wound with mild soap and water Anesthetic: Wound #2 Left,Anterior Lower Leg: Topical Lidocaine 4% cream  applied to wound bed prior to debridement Wound #3 Right,Anterior Lower Leg: Topical Lidocaine 4% cream applied to wound bed prior to debridement Wound #4 Right,Medial Lower Leg: Topical Lidocaine 4% cream applied to wound bed prior to debridement Wound #5 Right,Posterior Calcaneus: Topical Lidocaine 4% cream applied to wound bed prior to debridement Primary Wound Dressing: Wound #2 Left,Anterior Lower Leg: Boardered Foam Dressing Wound #3 Right,Anterior Lower Leg: Santyl Ointment Wound #4 Right,Medial Lower Leg: Santyl Ointment Wound #5 Right,Posterior Calcaneus: Santyl Ointment Secondary Dressing: Wound #3 Right,Anterior Lower Leg: Boardered Foam Dressing Wound #4 Right,Medial Lower Leg: Boardered Foam Dressing Wound #5 Right,Posterior Calcaneus: Dry Gauze Foam - foam heel cup AZUMI, ISAKSON ([id number]) Dressing Change Frequency: Wound #2 Left,Anterior Lower Leg: Change dressing every day. Wound #3 Right,Anterior Lower Leg: Change dressing every day. Wound #4 Right,Medial Lower Leg: Change dressing every day. Wound #5 Right,Posterior Calcaneus: Change dressing every day. Follow-up Appointments: Return Appointment in 1 week. Off-Loading: Other: - float heels with pillow under calves when in bed; wear pressure relieving boots while in bed Radiology ordered were: X-ray, right foot - x ray rt foot; non-healing DFU Services and Therapies ordered were: Arterial Studies- Bilateral with doppler - Bilateral arterial studies with doppler; Abnormal ABIs in clinic The following medication(s) was prescribed: Santyl topical 250 unit/gram ointment ointment topical as directed starting 12/02/2015 this 80 year old patient who is a diabetic now has a stage III pressure ulcer of the right heel after recent hip fracture and some associated lower extremity ulcerations. I have recommended: 1. Santyl ointment to the wounds on the right heel and the right lower extremity to be covered with  a bordered foam. 2. left lower extremity wound just to be covered with a bordered foam 3. Arterial duplex studies of both lower extremities as her ABI has been low during this visit 4. Good control of her diabetes 5.  Adequate nutrition with protein supplements, vitamin A, vitamin C and zinc 6. Regular visits to the wound center The patient and her husband have read all questions answered and will be compliant Electronic Signature(s) Signed: 12/02/2015 4:58:23 PM By: [name], FACS Entered By: [name] on 12/02/2015 16:58:22 [name] ([id number]) -------------------------------------------------------------------------------- ROS/PFSH Details Patient Name: [name] Date of Service: 12/02/2015 3:00 PM Medical Record Patient Account Number: [id number] Number: Afful, RN, BSN, Treating RN: 1935/07/11 (80 y.o. [name] Date of Birth/Sex: Female) Other Clinician: Primary Care Physician: Halina Maidens Treating [name] Referring Physician: Halina Maidens Physician/Extender: Suella Grove in Treatment: 0 Information Obtained From Patient Wound History Do you currently have one or more open woundso Yes How many open wounds do you currently haveo 1 How have you been treating your wound(s) until nowo dry dressing Has your wound(s) ever healed and then re-openedo No Have you had any lab work done in the past montho No Have you tested positive for an antibiotic resistant organism (MRSA, VRE)o No Have you tested positive for osteomyelitis (bone infection)o No Have you had any tests for circulation on your legso No Have you had other problems associated with your woundso Swelling Constitutional Symptoms (General Health) Complaints and Symptoms: Positive for: Fatigue Eyes Complaints and Symptoms: No Complaints or Symptoms Complaints and Symptoms: Negative for: Dry Eyes Medical History: Negative for: Cataracts; Glaucoma; Optic Neuritis Integumentary  (Skin) Complaints and Symptoms: Positive for: Wounds; Breakdown Medical History: Negative for: History of Burn Ear/Nose/Mouth/Throat NOBIA, [name] ([id number]) Complaints and Symptoms: No Complaints or Symptoms Medical History: Negative for: Chronic sinus problems/congestion; Middle ear problems Hematologic/Lymphatic Complaints and Symptoms: No Complaints or Symptoms Medical History: Negative for: Anemia; Hemophilia; Human Immunodeficiency Virus; Lymphedema; Sickle Cell Disease Respiratory Complaints and Symptoms: No Complaints or Symptoms Medical History: Positive for: Chronic Obstructive Pulmonary Disease (COPD) Cardiovascular Complaints and Symptoms: No Complaints or Symptoms Medical History: Positive for: Angina - a fib; Congestive Heart Failure Gastrointestinal Complaints and Symptoms: No Complaints or Symptoms Medical History: Negative for: Cirrhosis ; Colitis; Crohnos; Hepatitis A; Hepatitis B; Hepatitis C Endocrine Medical History: Positive for: Type II Diabetes Treated with: Oral agents Genitourinary Complaints and Symptoms: No Complaints or Symptoms Medical HistoryTAMEEKA, [name] ([id number]) Negative for: End Stage Renal Disease Immunological Complaints and Symptoms: No Complaints or Symptoms Medical History: Positive for: Lupus Erythematosus - SLE Musculoskeletal Complaints and Symptoms: No Complaints or Symptoms Medical History: Positive for: Osteoarthritis Negative for: Gout; Rheumatoid Arthritis; Osteomyelitis Neurologic Complaints and Symptoms: No Complaints or Symptoms Medical History: Positive for: Dementia - infarct Negative for: Neuropathy; Quadriplegia; Paraplegia; Seizure Disorder Past Medical History Notes: raynauds Oncologic Complaints and Symptoms: No Complaints or Symptoms Medical History: Negative for: Received Chemotherapy; Received Radiation Psychiatric Complaints and Symptoms: No Complaints or Symptoms Medical  History: Negative for: Anorexia/bulimia; Confinement Anxiety Family and Social History Former smoker - 4 years ago; Marital Status - Married; Alcohol Use: Never; Drug Use: No History; Caffeine Use: Never; Financial Concerns: No; Food, Clothing or Shelter Needs: No; Support System Lacking: No; Transportation Concerns: No; Advanced Directives: Yes (Not Provided); Patient does not want information on Advanced Directives; Living Will: Yes (Not Provided); Medical Power of Attorney: Yes (Not Provided) [name], [name] ([id number]) Physician Affirmation I have reviewed and agree with the above information. Electronic Signature(s) Signed: 12/02/2015 4:47:00 PM By: [name], FACS Signed: 12/02/2015 5:30:03 PM By: Regan Lemming BSN, RN Previous Signature: 12/02/2015 4:07:17 PM Version By: Regan Lemming BSN, RN Previous Signature: 12/02/2015 4:05:04 PM  Version By: Regan Lemming BSN, RN Entered By: [name] on 12/02/2015 16:47:00 [name] ([id number]) -------------------------------------------------------------------------------- [place] Details Patient Name: [name] Date of Service: 12/02/2015 Medical Record Number: [id number] Patient Account Number: [id number] Date of Birth/Sex: Sep 01, 1935 (80 y.o. Female) Treating RN: Alanda Amass Primary Care Physician: Halina Maidens Other Clinician: Referring Physician: Halina Maidens Treating Physician/Extender: Frann Rider in Treatment: 0 Diagnosis Coding ICD-10 Codes Code Description [phone] Type 2 diabetes mellitus with foot ulcer L89.613 Pressure ulcer of right heel, stage 3 L97.212 Non-pressure chronic ulcer of right calf with fat layer exposed L97.211 Non-pressure chronic ulcer of right calf limited to breakdown of skin Z79.01 Long term (current) use of anticoagulants Facility Procedures CPT4 Code: [id number] Description: [phone] - WOUND CARE VISIT-LEV 5 EST PT Modifier: 25 Quantity: 1 Physician Procedures CPT4  Code Description: [id number] - WC PHYS LEVEL 4 - EST PT ICD-10 Description Diagnosis E11.621 Type 2 diabetes mellitus with foot ulcer L89.613 Pressure ulcer of right heel, stage 3 L97.212 Non-pressure chronic ulcer of right calf with fat L97.211  Non-pressure chronic ulcer of right calf limited t Modifier: layer exposed o breakdown of Quantity: 1 skin Electronic Signature(s) Signed: 12/02/2015 5:30:07 PM By: Alanda Amass Previous Signature: 12/02/2015 4:58:38 PM Version By: [name], FACS Entered By: Alanda Amass on 12/02/2015 17:05:47

## 2015-12-03 NOTE — Progress Notes (Signed)
[name], [name] ([id number]) Visit Report for 12/02/2015 Allergy List Details Patient Name: [name], [name]. Date of Service: 12/02/2015 3:00 PM Medical Record Patient Account Number: [id number] Number: Afful, RN, BSN, Treating RN: [birth year] (80 y.o. [name] Date of Birth/Sex: Female) Other Clinician: Primary Care Physician: Halina Maidens Treating [name] Referring Physician: Halina Maidens Physician/Extender: Suella Grove in Treatment: 0 Allergies Active Allergies codeine penicillin Sulfa (Sulfonamide Antibiotics) doxycycline erythromycin base latex Allergy Notes Electronic Signature(s) Signed: 12/02/2015 4:00:30 PM By: Regan Lemming BSN, RN Entered By: Regan Lemming on 12/02/2015 16:00:30 [name] ([id number]) -------------------------------------------------------------------------------- Arrival Information Details Patient Name: [name], [name] 12/02/2015 3:00 Date of Service: PM Medical Record [id number] Number: Patient Account Number: [id number] 1935-10-05 (80 y.o. Treating RN: Alanda Amass Date of Birth/Sex: Female) Other Clinician: Primary Care Physician: Halina Maidens Treating [name] Referring Physician: Halina Maidens Physician/Extender: Suella Grove in Treatment: 0 Visit Information Patient Arrived: Wheel Chair Arrival Time: 15:20 Accompanied By: husband Transfer Assistance: Manual Patient Identification Verified: Yes Secondary Verification Process Yes Completed: Patient Requires Transmission- No Based Precautions: Patient Has Alerts: Yes Patient Alerts: Patient on Blood Thinner History Since Last Visit Added or deleted any medications: No Any new allergies or adverse reactions: No Had a fall or experienced change in activities of daily living that may affect risk of falls: No Signs or symptoms of abuse/neglect since last visito No Hospitalized since last visit: No Has Dressing in Place as Prescribed: Yes Electronic  Signature(s) Signed: 12/02/2015 4:20:29 PM By: Regan Lemming BSN, RN Entered By: Regan Lemming on 12/02/2015 16:20:29 [name] ([id number]) -------------------------------------------------------------------------------- Clinic Level of Care Assessment Details Patient Name: [name], [name] 12/02/2015 3:00 Date of Service: PM Medical Record [id number] Number: Patient Account Number: [id number] 11-May-1936 (80 y.o. Treating RN: Alanda Amass Date of Birth/Sex: Female) Other Clinician: Primary Care Physician: Halina Maidens Treating [name] Referring Physician: Halina Maidens Physician/Extender: Suella Grove in Treatment: 0 Clinic Level of Care Assessment Items TOOL 2 Quantity Score X - Use when only an EandM is performed on the INITIAL visit 1 0 ASSESSMENTS - Nursing Assessment / Reassessment X - General Physical Exam (combine w/ comprehensive assessment (listed just 1 20 below) when performed on new pt. evals) X - Comprehensive Assessment (HX, ROS, Risk Assessments, Wounds Hx, etc.) 1 25 ASSESSMENTS - Wound and Skin Assessment / Reassessment []  - Simple Wound Assessment / Reassessment - one wound 0 X - Complex Wound Assessment / Reassessment - multiple wounds 4 5 []  - Dermatologic / Skin Assessment (not related to wound area) 0 ASSESSMENTS - Ostomy and/or Continence Assessment and Care []  - Incontinence Assessment and Management 0 []  - Ostomy Care Assessment and Management (repouching, etc.) 0 PROCESS - Coordination of Care []  - Simple Patient / Family Education for ongoing care 0 X - Complex (extensive) Patient / Family Education for ongoing care 1 20 X - Staff obtains Programmer, systems, Records, Test Results / Process Orders 1 10 []  - Staff telephones HHA, Nursing Homes / Clarify orders / etc 0 []  - Routine Transfer to another Facility (non-emergent condition) 0 []  - Routine Hospital Admission (non-emergent condition) 0 []  - New Admissions / Biomedical engineer / Ordering  NPWT, Apligraf, etc. 0 X - Emergency Hospital Admission (emergent condition) 1 20 [name], CARDINALI. ([id number]) []  - Simple Discharge Coordination 0 X - Complex (extensive) Discharge Coordination 1 15 PROCESS - Special Needs []  - Pediatric / Minor Patient Management 0 []  - Isolation Patient Management 0 []  - Hearing / Language / Visual special needs 0 []  -  Assessment of Community assistance (transportation, D/C planning, etc.) 0 []  - Additional assistance / Altered mentation 0 []  - Support Surface(s) Assessment (bed, cushion, seat, etc.) 0 INTERVENTIONS - Wound Cleansing / Measurement X - Wound Imaging (photographs - any number of wounds) 1 5 []  - Wound Tracing (instead of photographs) 0 []  - Simple Wound Measurement - one wound 0 X - Complex Wound Measurement - multiple wounds 4 5 []  - Simple Wound Cleansing - one wound 0 []  - Complex Wound Cleansing - multiple wounds 0 INTERVENTIONS - Wound Dressings X - Small Wound Dressing one or multiple wounds 4 10 []  - Medium Wound Dressing one or multiple wounds 0 []  - Large Wound Dressing one or multiple wounds 0 []  - Application of Medications - injection 0 INTERVENTIONS - Miscellaneous []  - External ear exam 0 []  - Specimen Collection (cultures, biopsies, blood, body fluids, etc.) 0 []  - Specimen(s) / Culture(s) sent or taken to Lab for analysis 0 []  - Patient Transfer (multiple staff / Harrel Lemon Lift / Similar devices) 0 []  - Simple Staple / Suture removal (25 or less) 0 [name], [name] ([id number]) []  - Complex Staple / Suture removal (26 or more) 0 []  - Hypo / Hyperglycemic Management (close monitor of Blood Glucose) 0 X - Ankle / Brachial Index (ABI) - do not check if billed separately 1 15 Has the patient been seen at the hospital within the last three years: Yes Total Score: 210 Level Of Care: New/Established - Level 5 Electronic Signature(s) Signed: 12/02/2015 5:30:07 PM By: Alanda Amass Entered By: Alanda Amass on 12/02/2015  16:45:45 [name] ([id number]) -------------------------------------------------------------------------------- Encounter Discharge Information Details Patient Name: [name], [name] 12/02/2015 3:00 Date of Service: PM Medical Record [id number] Number: Patient Account Number: [id number] 28-Feb-1936 (80 y.o. Treating RN: Alanda Amass Date of Birth/Sex: Female) Other Clinician: Primary Care Physician: Halina Maidens Treating [name] Referring Physician: Halina Maidens Physician/Extender: Suella Grove in Treatment: 0 Encounter Discharge Information Items Schedule Follow-up Appointment: No Medication Reconciliation completed and provided to Patient/Care No Caira Poche: Provided on Clinical Summary of Care: 12/02/2015 Form Type Recipient Paper Patient LK Electronic Signature(s) Signed: 12/02/2015 4:46:17 PM By: Ruthine Dose Entered By: Ruthine Dose on 12/02/2015 16:46:17 [name] ([id number]) -------------------------------------------------------------------------------- Lower Extremity Assessment Details Patient Name: [name], [name] 12/02/2015 3:00 Date of Service: PM Medical Record [id number] Number: Patient Account Number: [id number] 03-05-1936 (79 y.o. Treating RN: Alanda Amass Date of Birth/Sex: Female) Other Clinician: Primary Care Physician: Halina Maidens Treating [name] Referring Physician: Halina Maidens Physician/Extender: Suella Grove in Treatment: 0 Edema Assessment Assessed: [Left: No] [Right: No] Edema: [Left: No] [Right: Yes] Calf Left: Right: Point of Measurement: 30 cm From Medial Instep 27.2 cm 27 cm Ankle Left: Right: Point of Measurement: 10 cm From Medial Instep 18.2 cm 21.2 cm Vascular Assessment Claudication: Claudication Assessment [Left:None] [Right:None] Pulses: Posterior Tibial Dorsalis Pedis Palpable: [Left:Yes] [Right:Yes] Extremity colors, hair growth, and conditions: Extremity Color: [Left:Normal]  [Right:Normal] Hair Growth on Extremity: [Left:Yes] [Right:Yes] Temperature of Extremity: [Left:Cool] [Right:Cool] Capillary Refill: [Left:< 3 seconds] [Right:< 3 seconds] Dependent Rubor: [Left:No] [Right:No] Blanched when Elevated: [Left:No] [Right:No] Lipodermatosclerosis: [Left:No] [Right:No] Blood Pressure: Brachial: [Left:125] [Right:125] Dorsalis Pedis: 80 [Left:Dorsalis Pedis: 70] Ankle: Posterior Tibial: [Left:Posterior Tibial: 0.64] [Right:0.56] Toe Nail Assessment Left: Right: TIMMEKA, TULLOS ([id number]) Thick: No No Discolored: No No Deformed: No No Improper Length and Hygiene: No No Electronic Signature(s) Signed: 12/02/2015 5:30:07 PM By: Alanda Amass Entered By: Alanda Amass on 12/02/2015 15:50:41 [name] ([id number]) -------------------------------------------------------------------------------- Multi-Disciplinary Care  Plan Details Patient Name: [name], [name] 12/02/2015 3:00 Date of Service: PM Medical Record [id number] Number: Patient Account Number: [id number] 07/21/1935 (80 y.o. Treating RN: Alanda Amass Date of Birth/Sex: Female) Other Clinician: Primary Care Physician: Halina Maidens Treating [name] Referring Physician: Halina Maidens Physician/Extender: Suella Grove in Treatment: 0 Active Inactive Abuse / Safety / Falls / Self Care Management Nursing Diagnoses: Potential for falls Goals: Patient/caregiver will verbalize understanding of skin care regimen Date Initiated: 12/02/2015 Goal Status: Active Patient/caregiver will verbalize/demonstrate measures taken to prevent injury and/or falls Date Initiated: 12/02/2015 Goal Status: Active Interventions: Assess fall risk on admission and as needed Assess: immobility, friction, shearing, incontinence upon admission and as needed Assess impairment of mobility on admission and as needed per policy Assess self care needs on admission and as needed Provide education on fall  prevention Provide education on safe transfers Notes: Wound/Skin Impairment Nursing Diagnoses: Impaired tissue integrity Goals: Patient/caregiver will verbalize understanding of skin care regimen Date Initiated: 12/02/2015 Goal Status: Active Interventions: Assess patient/caregiver ability to obtain necessary supplies BONNY, BELIN ([id number]) Assess patient/caregiver ability to perform ulcer/skin care regimen upon admission and as needed Assess ulceration(s) every visit Provide education on ulcer and skin care Notes: Electronic Signature(s) Signed: 12/02/2015 5:30:07 PM By: Alanda Amass Entered By: Alanda Amass on 12/02/2015 16:28:40 [name] ([id number]) -------------------------------------------------------------------------------- Pain Assessment Details Patient Name: [name] 12/02/2015 3:00 Date of Service: PM Medical Record [id number] Number: Patient Account Number: [id number] December 20, 1935 (80 y.o. Treating RN: Alanda Amass Date of Birth/Sex: Female) Other Clinician: Primary Care Physician: Halina Maidens Treating [name] Referring Physician: Halina Maidens Physician/Extender: Suella Grove in Treatment: 0 Active Problems Location of Pain Severity and Description of Pain Patient Has Paino Yes Site Locations Pain Location: Pain in Ulcers With Dressing Change: No Duration of the Pain. Constant / Intermittento Intermittent How Long Does it Lasto Hours: Minutes: 5 Rate the pain. Current Pain Level: 5 Worst Pain Level: 9 Least Pain Level: 0 Tolerable Pain Level: 5 Character of Pain Describe the Pain: Throbbing Pain Management and Medication Current Pain Management: Medication: No Cold Application: No Rest: Yes Massage: No Activity: No T.E.N.S.: No Heat Application: No Leg drop or elevation: No Is the Current Pain Management Adequate Adequate: How does your pain impact your activities of daily livingo Sleep: Yes Bathing:  No Appetite: No Relationship With Others: No Bladder Continence: No Emotions: No Bowel Continence: No Work: No [name], [name] ([id number]) Toileting: No Drive: No Dressing: No Hobbies: No Electronic Signature(s) Signed: 12/02/2015 5:30:07 PM By: Alanda Amass Entered By: Alanda Amass on 12/02/2015 15:25:12 [name] ([id number]) -------------------------------------------------------------------------------- Patient/Caregiver Education Details Patient Name: [name], [name] 12/02/2015 3:00 Date of Service: PM Medical Record [id number] Number: Patient Account Number: [id number] [birth year] (80 y.o. Treating RN: Alanda Amass Date of Birth/Gender: Female) Other Clinician: Primary Care Physician: Halina Maidens Treating [name] Referring Physician: Halina Maidens Physician/Extender: Suella Grove in Treatment: 0 Education Assessment Education Provided To: Patient and Caregiver Education Topics Provided Safety: Methods: Explain/Verbal Responses: State content correctly Wound/Skin Impairment: Methods: Explain/Verbal Responses: State content correctly Electronic Signature(s) Signed: 12/02/2015 5:30:07 PM By: Alanda Amass Entered By: Alanda Amass on 12/02/2015 16:50:07 [name] ([id number]) -------------------------------------------------------------------------------- Wound Assessment Details Patient Name: [name] Date of Service: 12/02/2015 3:00 PM Medical Record Patient Account Number: [id number] Number: Afful, RN, BSN, Treating RN: August 31, 1935 (80 y.o. [name] Date of Birth/Sex: Female) Other Clinician: Primary Care Physician: Halina Maidens Treating [name] Referring Physician: Halina Maidens Physician/Extender: Suella Grove in Treatment: 0  Wound Status Wound Number: 2 Primary Diabetic Wound/Ulcer of the Lower Etiology: Extremity Wound Location: Left Lower Leg - Anterior Wound Open Wounding Event: Gradually  Appeared Status: Date Acquired: 10/27/2015 Comorbid Chronic Obstructive Pulmonary Disease Weeks Of Treatment: 0 History: (COPD), Angina, Congestive Heart Clustered Wound: No Failure, Type II Diabetes, Lupus Erythematosus, Osteoarthritis, Dementia Wound Measurements Length: (cm) 0.3 Width: (cm) 0.3 Depth: (cm) 0.1 Area: (cm) 0.071 Volume: (cm) 0.007 % Reduction in Area: % Reduction in Volume: Epithelialization: None Tunneling: No Undermining: No Wound Description Classification: Grade 1 Wound Margin: Distinct, outline attached Exudate Amount: Medium Exudate Type: Serosanguineous Exudate Color: red, brown Foul Odor After Cleansing: No Wound Bed Granulation Amount: Large (67-100%) Exposed Structure Necrotic Amount: None Present (0%) Fascia Exposed: No Fat Layer Exposed: No Tendon Exposed: No Muscle Exposed: No Joint Exposed: No Bone Exposed: No Limited to Skin Breakdown Periwound Skin Texture Texture Color No Abnormalities Noted: No No Abnormalities Noted: No [name], [name] ([id number]) Callus: No Atrophie Blanche: No Crepitus: No Cyanosis: No Excoriation: No Ecchymosis: No Fluctuance: No Erythema: No Friable: No Hemosiderin Staining: No Induration: No Mottled: No Localized Edema: Yes Pallor: No Rash: No Rubor: No Scarring: No Temperature / Pain Moisture Temperature: No Abnormality No Abnormalities Noted: No Dry / Scaly: No Maceration: No Moist: Yes Wound Preparation Ulcer Cleansing: Rinsed/Irrigated with Saline Topical Anesthetic Applied: Other: lidocaine 4%, Treatment Notes Wound #2 (Left, Anterior Lower Leg) 1. Cleansed with: Clean wound with Normal Saline 4. Dressing Applied: Other dressing (specify in notes) Notes border foam Electronic Signature(s) Signed: 12/02/2015 4:12:44 PM By: Regan Lemming BSN, RN Entered By: Regan Lemming on 12/02/2015 16:12:44 [name]  ([id number]) -------------------------------------------------------------------------------- Wound Assessment Details Patient Name: [name] Date of Service: 12/02/2015 3:00 PM Medical Record Patient Account Number: [id number] Number: Afful, RN, BSN, Treating RN: Aug 12, 1935 (80 y.o. [name] Date of Birth/Sex: Female) Other Clinician: Primary Care Physician: Halina Maidens Treating [name] Referring Physician: Halina Maidens Physician/Extender: Suella Grove in Treatment: 0 Wound Status Wound Number: 3 Primary Diabetic Wound/Ulcer of the Lower Etiology: Extremity Wound Location: Right Lower Leg - Anterior Wound Open Wounding Event: Gradually Appeared Status: Date Acquired: 11/03/2015 Comorbid Chronic Obstructive Pulmonary Disease Weeks Of Treatment: 0 History: (COPD), Angina, Congestive Heart Clustered Wound: No Failure, Type II Diabetes, Lupus Erythematosus, Osteoarthritis, Dementia Wound Measurements Length: (cm) 0.9 Width: (cm) 0.6 Depth: (cm) 0.1 Area: (cm) 0.424 Volume: (cm) 0.042 % Reduction in Area: % Reduction in Volume: Epithelialization: None Tunneling: No Undermining: No Wound Description Classification: Grade 1 Wound Margin: Distinct, outline attached Exudate Amount: Medium Exudate Type: Serosanguineous Exudate Color: red, brown Foul Odor After Cleansing: No Wound Bed Granulation Amount: Small (1-33%) Exposed Structure Granulation Quality: Pink, Pale Fascia Exposed: No Necrotic Amount: Large (67-100%) Fat Layer Exposed: No Necrotic Quality: Adherent Slough Tendon Exposed: No Muscle Exposed: No Joint Exposed: No Bone Exposed: No Limited to Skin Breakdown Periwound Skin Texture Texture Color No Abnormalities Noted: No No Abnormalities Noted: No [name], [name] ([id number]) Callus: No Atrophie Blanche: No Crepitus: No Cyanosis: No Excoriation: No Ecchymosis: No Fluctuance: No Erythema: No Friable: No Hemosiderin  Staining: No Induration: No Mottled: No Localized Edema: Yes Pallor: No Rash: No Rubor: No Scarring: No Temperature / Pain Moisture Temperature: No Abnormality No Abnormalities Noted: No Dry / Scaly: No Maceration: No Moist: Yes Wound Preparation Ulcer Cleansing: Rinsed/Irrigated with Saline Topical Anesthetic Applied: Other: lidocaine 4%, Treatment Notes Wound #3 (Right, Anterior Lower Leg) 1. Cleansed with: Clean wound with Normal Saline 2. Anesthetic Topical Lidocaine  4% cream to wound bed prior to debridement 4. Dressing Applied: Santyl Ointment 5. Secondary Dressing Applied Bordered Foam Dressing Electronic Signature(s) Signed: 12/02/2015 4:14:03 PM By: Regan Lemming BSN, RN Entered By: Regan Lemming on 12/02/2015 16:14:02 [name] ([id number]) -------------------------------------------------------------------------------- Wound Assessment Details Patient Name: [name] Date of Service: 12/02/2015 3:00 PM Medical Record Patient Account Number: [id number] Number: Afful, RN, BSN, Treating RN: Nov 19, 1935 (80 y.o. [name] Date of Birth/Sex: Female) Other Clinician: Primary Care Physician: Halina Maidens Treating [name] Referring Physician: Halina Maidens Physician/Extender: Suella Grove in Treatment: 0 Wound Status Wound Number: 4 Primary Diabetic Wound/Ulcer of the Lower Etiology: Extremity Wound Location: Right Lower Leg - Medial Wound Open Wounding Event: Gradually Appeared Status: Date Acquired: 11/03/2015 Comorbid Chronic Obstructive Pulmonary Disease Weeks Of Treatment: 0 History: (COPD), Angina, Congestive Heart Clustered Wound: No Failure, Type II Diabetes, Lupus Erythematosus, Osteoarthritis, Dementia Wound Measurements Length: (cm) 1.2 Width: (cm) 2.6 Depth: (cm) 0.1 Area: (cm) 2.45 Volume: (cm) 0.245 % Reduction in Area: % Reduction in Volume: Epithelialization: None Tunneling: No Undermining: No Wound  Description Classification: Grade 1 Wound Margin: Distinct, outline attached Exudate Amount: Medium Exudate Type: Serosanguineous Exudate Color: red, brown Foul Odor After Cleansing: No Wound Bed Granulation Amount: Small (1-33%) Exposed Structure Granulation Quality: Pink, Pale Fascia Exposed: No Necrotic Amount: Large (67-100%) Fat Layer Exposed: No Necrotic Quality: Adherent Slough Tendon Exposed: No Muscle Exposed: No Joint Exposed: No Bone Exposed: No Limited to Skin Breakdown Periwound Skin Texture Texture Color No Abnormalities Noted: No No Abnormalities Noted: No LASHAI, STURGELL ([id number]) Callus: No Atrophie Blanche: No Crepitus: No Cyanosis: No Excoriation: No Ecchymosis: No Fluctuance: No Erythema: No Friable: No Hemosiderin Staining: No Induration: No Mottled: No Localized Edema: Yes Pallor: No Rash: No Rubor: No Scarring: No Temperature / Pain Moisture Temperature: No Abnormality No Abnormalities Noted: No Dry / Scaly: No Maceration: No Moist: Yes Wound Preparation Ulcer Cleansing: Rinsed/Irrigated with Saline Topical Anesthetic Applied: Other: lidocaine 4%, Treatment Notes Wound #4 (Right, Medial Lower Leg) 1. Cleansed with: Clean wound with Normal Saline 2. Anesthetic Topical Lidocaine 4% cream to wound bed prior to debridement 4. Dressing Applied: Santyl Ointment 5. Secondary Dressing Applied Bordered Foam Dressing Electronic Signature(s) Signed: 12/02/2015 4:15:27 PM By: Regan Lemming BSN, RN Entered By: Regan Lemming on 12/02/2015 16:15:27 [name] ([id number]) -------------------------------------------------------------------------------- Wound Assessment Details Patient Name: [name] Date of Service: 12/02/2015 3:00 PM Medical Record Patient Account Number: [id number] Number: Afful, RN, BSN, Treating RN: January 18, 1936 (80 y.o. [name] Date of Birth/Sex: Female) Other Clinician: Primary Care Physician:  Halina Maidens Treating [name] Referring Physician: Halina Maidens Physician/Extender: Suella Grove in Treatment: 0 Wound Status Wound Number: 5 Primary Diabetic Wound/Ulcer of the Lower Etiology: Extremity Wound Location: Right Calcaneus - Posterior Wound Open Wounding Event: Gradually Appeared Status: Date Acquired: 11/03/2015 Comorbid Chronic Obstructive Pulmonary Disease Weeks Of Treatment: 0 History: (COPD), Angina, Congestive Heart Clustered Wound: No Failure, Type II Diabetes, Lupus Erythematosus, Osteoarthritis, Dementia Wound Measurements Length: (cm) 3.8 Width: (cm) 3.2 Depth: (cm) 0.1 Area: (cm) 9.55 Volume: (cm) 0.955 % Reduction in Area: % Reduction in Volume: Epithelialization: None Tunneling: No Undermining: No Wound Description Classification: Grade 1 Wound Margin: Distinct, outline attached Exudate Amount: Medium Exudate Type: Serosanguineous Exudate Color: red, brown Foul Odor After Cleansing: No Wound Bed Granulation Amount: None Present (0%) Exposed Structure Necrotic Amount: Large (67-100%) Fascia Exposed: No Necrotic Quality: Eschar, Adherent Slough Fat Layer Exposed: No Tendon Exposed: No Muscle Exposed: No Joint Exposed: No  Bone Exposed: No Limited to Skin Breakdown Periwound Skin Texture Texture Color No Abnormalities Noted: No No Abnormalities Noted: No KYNADIE, ARNOUX ([id number]) Callus: No Atrophie Blanche: No Crepitus: No Cyanosis: No Excoriation: No Ecchymosis: No Fluctuance: No Erythema: No Friable: No Hemosiderin Staining: No Induration: No Mottled: Yes Localized Edema: No Pallor: No Rash: No Rubor: No Scarring: No Temperature / Pain Moisture Temperature: No Abnormality No Abnormalities Noted: No Dry / Scaly: No Maceration: No Moist: Yes Wound Preparation Ulcer Cleansing: Rinsed/Irrigated with Saline Topical Anesthetic Applied: Other: lidocaine 4%, Treatment Notes Wound #5 (Right, Posterior  Calcaneus) 1. Cleansed with: Clean wound with Normal Saline 2. Anesthetic Topical Lidocaine 4% cream to wound bed prior to debridement 4. Dressing Applied: Santyl Ointment 5. Secondary Dressing Applied Bordered Foam Dressing Electronic Signature(s) Signed: 12/02/2015 4:19:01 PM By: Regan Lemming BSN, RN Entered By: Regan Lemming on 12/02/2015 16:19:01 [name] ([id number]) -------------------------------------------------------------------------------- [place] Details Patient Name: AELA, DRYDEN 12/02/2015 3:00 Date of Service: PM Medical Record [id number] Number: Patient Account Number: [id number] [birth year] (80 y.o. Treating RN: Alanda Amass Date of Birth/Sex: Female) Other Clinician: Primary Care Physician: Halina Maidens Treating [name] Referring Physician: Halina Maidens Physician/Extender: Suella Grove in Treatment: 0 Vital Signs Time Taken: 15:22 Temperature (F): 98.2 Height (in): 64 Pulse (bpm): 60 Source: Stated Respiratory Rate (breaths/min): 16 Weight (lbs): 110 Blood Pressure (mmHg): 125/53 Source: Stated Reference Range: 80 - 120 mg / dl Body Mass Index (BMI): 18.9 Notes Pt/cg state fasting BG= 222 this am. Electronic Signature(s) Signed: 12/02/2015 5:30:07 PM By: Alanda Amass Entered By: Alanda Amass on 12/02/2015 15:27:03

## 2015-12-03 NOTE — Progress Notes (Signed)
RAIHANA, KOTEK ([id number]) Visit Report for 12/02/2015 Abuse/Suicide Risk Screen Details Patient Name: [name], [name]. Date of Service: 12/02/2015 3:00 PM Medical Record Patient Account Number: [id number] Number: Afful, RN, BSN, Treating RN: [birth year] (80 y.o. [name] Date of Birth/Sex: Female) Other Clinician: Primary Care Physician: Halina Maidens Treating [name] Referring Physician: Halina Maidens Physician/Extender: Suella Grove in Treatment: 0 Abuse/Suicide Risk Screen Items Answer ABUSE/SUICIDE RISK SCREEN: Has anyone close to you tried to hurt or harm you recentlyo No Do you feel uncomfortable with anyone in your familyo No Has anyone forced you do things that you didnot want to doo No Do you have any thoughts of harming yourselfo No Patient displays signs or symptoms of abuse and/or neglect. No Electronic Signature(s) Signed: 12/02/2015 4:00:42 PM By: Regan Lemming BSN, RN Entered By: Regan Lemming on 12/02/2015 16:00:41 [name] ([id number]) -------------------------------------------------------------------------------- Activities of Daily Living Details Patient Name: [name]. Date of Service: 12/02/2015 3:00 PM Medical Record Patient Account Number: [id number] Number: Afful, RN, BSN, Treating RN: [birth year] (80 y.o. [name] Date of Birth/Sex: Female) Other Clinician: Primary Care Physician: Halina Maidens Treating [name] Referring Physician: Halina Maidens Physician/Extender: Suella Grove in Treatment: 0 Activities of Daily Living Items Answer Activities of Daily Living (Please select one for each item) Drive Automobile Not Able Take Medications Need Assistance Use Telephone Need Assistance Care for Appearance Need Assistance Use Toilet Need Assistance Bath / Shower Need Assistance Dress Self Need Assistance Feed Self Completely Able Walk Need Assistance Get In / Out Bed Need Assistance Housework Need  Assistance Prepare Meals Need Assistance Handle Money Need Assistance Shop for Self Need Assistance Electronic Signature(s) Signed: 12/02/2015 4:02:34 PM By: Regan Lemming BSN, RN Previous Signature: 12/02/2015 4:02:19 PM Version By: Regan Lemming BSN, RN Entered By: Regan Lemming on 12/02/2015 16:02:33 [name] ([id number]) -------------------------------------------------------------------------------- Education Assessment Details Patient Name: [name] Date of Service: 12/02/2015 3:00 PM Medical Record Patient Account Number: [id number] Number: Afful, RN, BSN, Treating RN: 1935-12-14 (80 y.o. [name] Date of Birth/Sex: Female) Other Clinician: Primary Care Physician: Halina Maidens Treating [name] Referring Physician: Halina Maidens Physician/Extender: Suella Grove in Treatment: 0 Primary Learner Assessed: Patient Learning Preferences/Education Level/Primary Language Learning Preference: Explanation Highest Education Level: College or Above Preferred Language: English Cognitive Barrier Assessment/Beliefs Language Barrier: No Physical Barrier Assessment Impaired Vision: No Impaired Hearing: No Decreased Hand dexterity: No Knowledge/Comprehension Assessment Knowledge Level: Medium Comprehension Level: Medium Ability to understand written Medium instructions: Motivation Assessment Anxiety Level: Calm Cooperation: Cooperative Education Importance: Acknowledges Need Interest in Health Problems: Asks Questions Perception: Coherent Willingness to Engage in Self- Medium Management Activities: Readiness to Engage in Self- Medium Management Activities: Electronic Signature(s) Signed: 12/02/2015 4:01:50 PM By: Regan Lemming BSN, RN Entered By: Regan Lemming on 12/02/2015 16:01:50 [name] ([id number]) -------------------------------------------------------------------------------- Fall Risk Assessment Details Patient Name: [name] Date  of Service: 12/02/2015 3:00 PM Medical Record Patient Account Number: [id number] Number: Afful, RN, BSN, Treating RN: 12/14/1935 (80 y.o. [name] Date of Birth/Sex: Female) Other Clinician: Primary Care Physician: Halina Maidens Treating [name] Referring Physician: Halina Maidens Physician/Extender: Suella Grove in Treatment: 0 Fall Risk Assessment Items Have you had 2 or more falls in the last 12 monthso 0 Yes Have you had any fall that resulted in injury in the last 12 monthso 0 No FALL RISK ASSESSMENT: History of falling - immediate or within 3 months 25 Yes Secondary diagnosis 0 No Ambulatory aid None/bed rest/wheelchair/nurse 0 Yes Crutches/cane/walker 0 No Furniture 0  No IV Access/Saline Lock 0 No Gait/Training Normal/bed rest/immobile 0 Yes Weak 10 Yes Impaired 0 No Mental Status Oriented to own ability 0 Yes Electronic Signature(s) Signed: 12/02/2015 4:20:50 PM By: Regan Lemming BSN, RN Previous Signature: 12/02/2015 4:01:01 PM Version By: Regan Lemming BSN, RN Entered By: Regan Lemming on 12/02/2015 16:20:49 [name] ([id number]) -------------------------------------------------------------------------------- Foot Assessment Details Patient Name: [name]. Date of Service: 12/02/2015 3:00 PM Medical Record Patient Account Number: [id number] Number: Afful, RN, BSN, Treating RN: 06/28/1935 (80 y.o. [name] Date of Birth/Sex: Female) Other Clinician: Primary Care Physician: Halina Maidens Treating [name] Referring Physician: Halina Maidens Physician/Extender: Suella Grove in Treatment: 0 Foot Assessment Items Site Locations + = Sensation present, - = Sensation absent, C = Callus, U = Ulcer R = Redness, W = Warmth, M = Maceration, PU = Pre-ulcerative lesion F = Fissure, S = Swelling, D = Dryness Assessment Right: Left: Other Deformity: No No Prior Foot Ulcer: No No Prior Amputation: No No Charcot Joint: No No Ambulatory Status:  Ambulatory With Help Assistance Device: Wheelchair Gait: Administrator, arts) Signed: 12/02/2015 4:01:16 PM By: Regan Lemming BSN, RN Entered By: Regan Lemming on 12/02/2015 16:01:16 [name] ([name] ([id number]) -------------------------------------------------------------------------------- Nutrition Risk Assessment Details Patient Name: [name] Date of Service: 12/02/2015 3:00 PM Medical Record Patient Account Number: [id number] Number: Afful, RN, BSN, Treating RN: [birth year] (80 y.o. [name] Date of Birth/Sex: Female) Other Clinician: Primary Care Physician: Halina Maidens Treating [name] Referring Physician: Halina Maidens Physician/Extender: Suella Grove in Treatment: 0 Height (in): 64 Weight (lbs): 110 Body Mass Index (BMI): 18.9 Nutrition Risk Assessment Items NUTRITION RISK SCREEN: I have an illness or condition that made me change the kind and/or 0 No amount of food I eat I eat fewer than two meals per day 3 Yes I eat few fruits and vegetables, or milk products 0 No I have three or more drinks of beer, liquor or wine almost every day 0 No I have tooth or mouth problems that make it hard for me to eat 0 No I don't always have enough money to buy the food I need 0 No I eat alone most of the time 0 No I take three or more different prescribed or over-the-counter drugs a 0 No day Without wanting to, I have lost or gained 10 pounds in the last six 0 No months I am not always physically able to shop, cook and/or feed myself 2 Yes Nutrition Protocols Good Risk Protocol Provide education on Moderate Risk Protocol 0 nutrition Electronic Signature(s) Signed: 12/02/2015 4:07:50 PM By: Regan Lemming BSN, RN Previous Signature: 12/02/2015 4:01:24 PM Version By: Regan Lemming BSN, RN Entered By: Regan Lemming on 12/02/2015 16:07:49

## 2015-12-06 ENCOUNTER — Ambulatory Visit
Admission: RE | Admit: 2015-12-06 | Discharge: 2015-12-06 | Disposition: A | Discharge status: Home / Self Care | Payer: Medicare Other | Source: Ambulatory Visit | Attending: Surgery | Admitting: Surgery

## 2015-12-06 ENCOUNTER — Other Ambulatory Visit: Payer: Self-pay | Admitting: Surgery

## 2015-12-06 DIAGNOSIS — S81801A Unspecified open wound, right lower leg, initial encounter: Secondary | ICD-10-CM

## 2015-12-06 DIAGNOSIS — L97411 Non-pressure chronic ulcer of right heel and midfoot limited to breakdown of skin: Secondary | ICD-10-CM | POA: Diagnosis not present

## 2015-12-06 DIAGNOSIS — E11621 Type 2 diabetes mellitus with foot ulcer: Secondary | ICD-10-CM | POA: Insufficient documentation

## 2015-12-06 DIAGNOSIS — L97419 Non-pressure chronic ulcer of right heel and midfoot with unspecified severity: Secondary | ICD-10-CM | POA: Diagnosis not present

## 2015-12-06 DIAGNOSIS — F0390 Unspecified dementia without behavioral disturbance: Secondary | ICD-10-CM | POA: Diagnosis not present

## 2015-12-06 DIAGNOSIS — L89610 Pressure ulcer of right heel, unstageable: Secondary | ICD-10-CM | POA: Diagnosis not present

## 2015-12-06 DIAGNOSIS — L89890 Pressure ulcer of other site, unstageable: Secondary | ICD-10-CM | POA: Diagnosis not present

## 2015-12-06 DIAGNOSIS — I5032 Chronic diastolic (congestive) heart failure: Secondary | ICD-10-CM | POA: Diagnosis not present

## 2015-12-06 DIAGNOSIS — L89620 Pressure ulcer of left heel, unstageable: Secondary | ICD-10-CM | POA: Diagnosis not present

## 2015-12-06 DIAGNOSIS — S72001D Fracture of unspecified part of neck of right femur, subsequent encounter for closed fracture with routine healing: Secondary | ICD-10-CM | POA: Diagnosis not present

## 2015-12-07 DIAGNOSIS — L89620 Pressure ulcer of left heel, unstageable: Secondary | ICD-10-CM | POA: Diagnosis not present

## 2015-12-07 DIAGNOSIS — L89610 Pressure ulcer of right heel, unstageable: Secondary | ICD-10-CM | POA: Diagnosis not present

## 2015-12-07 DIAGNOSIS — I5032 Chronic diastolic (congestive) heart failure: Secondary | ICD-10-CM | POA: Diagnosis not present

## 2015-12-07 DIAGNOSIS — L89890 Pressure ulcer of other site, unstageable: Secondary | ICD-10-CM | POA: Diagnosis not present

## 2015-12-07 DIAGNOSIS — S72001D Fracture of unspecified part of neck of right femur, subsequent encounter for closed fracture with routine healing: Secondary | ICD-10-CM | POA: Diagnosis not present

## 2015-12-07 DIAGNOSIS — F0390 Unspecified dementia without behavioral disturbance: Secondary | ICD-10-CM | POA: Diagnosis not present

## 2015-12-08 DIAGNOSIS — F0390 Unspecified dementia without behavioral disturbance: Secondary | ICD-10-CM | POA: Diagnosis not present

## 2015-12-08 DIAGNOSIS — L89890 Pressure ulcer of other site, unstageable: Secondary | ICD-10-CM | POA: Diagnosis not present

## 2015-12-08 DIAGNOSIS — I5032 Chronic diastolic (congestive) heart failure: Secondary | ICD-10-CM | POA: Diagnosis not present

## 2015-12-08 DIAGNOSIS — S72001D Fracture of unspecified part of neck of right femur, subsequent encounter for closed fracture with routine healing: Secondary | ICD-10-CM | POA: Diagnosis not present

## 2015-12-08 DIAGNOSIS — L89620 Pressure ulcer of left heel, unstageable: Secondary | ICD-10-CM | POA: Diagnosis not present

## 2015-12-08 DIAGNOSIS — L89610 Pressure ulcer of right heel, unstageable: Secondary | ICD-10-CM | POA: Diagnosis not present

## 2015-12-09 ENCOUNTER — Encounter: Payer: Medicare Other | Admitting: Surgery

## 2015-12-09 DIAGNOSIS — L89613 Pressure ulcer of right heel, stage 3: Secondary | ICD-10-CM | POA: Diagnosis not present

## 2015-12-09 DIAGNOSIS — L89610 Pressure ulcer of right heel, unstageable: Secondary | ICD-10-CM | POA: Diagnosis not present

## 2015-12-09 DIAGNOSIS — E11621 Type 2 diabetes mellitus with foot ulcer: Secondary | ICD-10-CM | POA: Diagnosis not present

## 2015-12-09 DIAGNOSIS — I5032 Chronic diastolic (congestive) heart failure: Secondary | ICD-10-CM | POA: Diagnosis not present

## 2015-12-09 DIAGNOSIS — F0390 Unspecified dementia without behavioral disturbance: Secondary | ICD-10-CM | POA: Diagnosis not present

## 2015-12-09 DIAGNOSIS — L97811 Non-pressure chronic ulcer of other part of right lower leg limited to breakdown of skin: Secondary | ICD-10-CM | POA: Diagnosis not present

## 2015-12-09 DIAGNOSIS — L89890 Pressure ulcer of other site, unstageable: Secondary | ICD-10-CM | POA: Diagnosis not present

## 2015-12-09 DIAGNOSIS — E11622 Type 2 diabetes mellitus with other skin ulcer: Secondary | ICD-10-CM | POA: Diagnosis not present

## 2015-12-09 DIAGNOSIS — S72001D Fracture of unspecified part of neck of right femur, subsequent encounter for closed fracture with routine healing: Secondary | ICD-10-CM | POA: Diagnosis not present

## 2015-12-09 DIAGNOSIS — L89620 Pressure ulcer of left heel, unstageable: Secondary | ICD-10-CM | POA: Diagnosis not present

## 2015-12-09 DIAGNOSIS — L97821 Non-pressure chronic ulcer of other part of left lower leg limited to breakdown of skin: Secondary | ICD-10-CM | POA: Diagnosis not present

## 2015-12-10 DIAGNOSIS — S72001D Fracture of unspecified part of neck of right femur, subsequent encounter for closed fracture with routine healing: Secondary | ICD-10-CM | POA: Diagnosis not present

## 2015-12-10 DIAGNOSIS — L89610 Pressure ulcer of right heel, unstageable: Secondary | ICD-10-CM | POA: Diagnosis not present

## 2015-12-10 DIAGNOSIS — L89890 Pressure ulcer of other site, unstageable: Secondary | ICD-10-CM | POA: Diagnosis not present

## 2015-12-10 DIAGNOSIS — I5032 Chronic diastolic (congestive) heart failure: Secondary | ICD-10-CM | POA: Diagnosis not present

## 2015-12-10 DIAGNOSIS — F0390 Unspecified dementia without behavioral disturbance: Secondary | ICD-10-CM | POA: Diagnosis not present

## 2015-12-10 DIAGNOSIS — L89620 Pressure ulcer of left heel, unstageable: Secondary | ICD-10-CM | POA: Diagnosis not present

## 2015-12-10 NOTE — Progress Notes (Addendum)
VERSA, MARSON ([id number]) Visit Report for 12/09/2015 Arrival Information Details Patient Name: [name], [name]. Date of Service: 12/09/2015 2:15 PM Medical Record Number: [id number] Patient Account Number: [id number] Date of Birth/Sex: 02-Aug-1935 (80 y.o. Female) Treating RN: Baruch Gouty, RN, BSN, [name] Primary Care Physician: Halina Maidens Other Clinician: Referring Physician: Halina Maidens Treating Physician/Extender: Frann Rider in Treatment: 1 Visit Information History Since Last Visit Added or deleted any medications: No Patient Arrived: Wheel Chair Any new allergies or adverse reactions: No Arrival Time: 14:27 Had a fall or experienced change in No Accompanied By: caregiver activities of daily living that may affect Transfer Assistance: None risk of falls: Patient Identification Verified: Yes Signs or symptoms of abuse/neglect since last No Secondary Verification Process Yes visito Completed: Has Dressing in Place as Prescribed: Yes Patient Requires Transmission- No Pain Present Now: Yes Based Precautions: Patient Has Alerts: Yes Patient Alerts: Patient on Blood Thinner Electronic Signature(s) Signed: 12/09/2015 2:28:32 PM By: Regan Lemming BSN, RN Previous Signature: 12/09/2015 2:27:56 PM Version By: Regan Lemming BSN, RN Entered By: Regan Lemming on 12/09/2015 14:28:32 [name] ([id number]) -------------------------------------------------------------------------------- Encounter Discharge Information Details Patient Name: [name]. Date of Service: 12/09/2015 2:15 PM Medical Record Number: [id number] Patient Account Number: [id number] Date of Birth/Sex: 03/16/1936 (80 y.o. Female) Treating RN: Baruch Gouty, RN, BSN, [name] Primary Care Physician: Halina Maidens Other Clinician: Referring Physician: Halina Maidens Treating Physician/Extender: Frann Rider in Treatment: 1 Encounter Discharge Information Items Discharge Pain Level: 0 Discharge  Condition: Stable Ambulatory Status: Wheelchair Discharge Destination: Home Transportation: Private Auto husband, Accompanied By: caregiver Schedule Follow-up Appointment: No Medication Reconciliation completed and provided to Patient/Care No Montrice Gracey: Provided on Clinical Summary of Care: 12/09/2015 Form Type Recipient Paper Patient lk Electronic Signature(s) Signed: 12/09/2015 4:20:12 PM By: Regan Lemming BSN, RN Previous Signature: 12/09/2015 3:21:04 PM Version By: Lorine Bears RCP, RRT, CHT Entered By: Regan Lemming on 12/09/2015 16:20:12 [name] ([id number]) -------------------------------------------------------------------------------- Lower Extremity Assessment Details Patient Name: [name] Date of Service: 12/09/2015 2:15 PM Medical Record Number: [id number] Patient Account Number: [id number] Date of Birth/Sex: 07-02-1935 (80 y.o. Female) Treating RN: Afful, RN, BSN, [name] Primary Care Physician: Halina Maidens Other Clinician: Referring Physician: Halina Maidens Treating Physician/Extender: Frann Rider in Treatment: 1 Edema Assessment Assessed: Shirlyn Goltz: No] Patrice Paradise: No] E[Left: dema] [Right: :] Calf Left: Right: Point of Measurement: 30 cm From Medial Instep 27 cm 27 cm Ankle Left: Right: Point of Measurement: 10 cm From Medial Instep 18 cm 21 cm Vascular Assessment Pulses: Posterior Tibial Dorsalis Pedis Palpable: [Left:No] [Right:No] Doppler: [Left:Monophasic] [Right:Monophasic] Extremity colors, hair growth, and conditions: Extremity Color: [Left:Mottled] [Right:Mottled] Hair Growth on Extremity: [Left:No] [Right:No] Temperature of Extremity: [Left:Warm] [Right:Warm] Capillary Refill: [Left:< 3 seconds] [Right:< 3 seconds] Electronic Signature(s) Signed: 12/09/2015 2:29:15 PM By: Regan Lemming BSN, RN Entered By: Regan Lemming on 12/09/2015 14:29:15 [name]  ([id number]) -------------------------------------------------------------------------------- Multi Wound Chart Details Patient Name: [name] Date of Service: 12/09/2015 2:15 PM Medical Record Number: [id number] Patient Account Number: [id number] Date of Birth/Sex: [birth year] (80 y.o. Female) Treating RN: Baruch Gouty, RN, BSN, [name] Primary Care Physician: Halina Maidens Other Clinician: Referring Physician: Halina Maidens Treating Physician/Extender: Frann Rider in Treatment: 1 Vital Signs Height(in): 64 Pulse(bpm): 63 Weight(lbs): 110 Blood Pressure 139/59 (mmHg): Body Mass Index(BMI): 19 Temperature(F): 98.1 Respiratory Rate 16 (breaths/min): Photos: [2:No Photos] [3:No Photos] [4:No Photos] Wound Location: [2:Left Lower Leg - Anterior] [3:Right Lower Leg - Anterior Right Lower Leg - Medial] Wounding Event: [2:Gradually Appeared] [  3:Gradually Appeared] [4:Gradually Appeared] Primary Etiology: [2:Diabetic Wound/Ulcer of the Lower Extremity] [3:Diabetic Wound/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] [4:the Lower Extremity] Comorbid History: [2:Chronic Obstructive Pulmonary Disease (COPD), Angina, Congestive Heart Failure, Type II Diabetes, Lupus Erythematosus, Osteoarthritis, Dementia] [3:Chronic Obstructive Pulmonary Disease (COPD), Angina, Congestive Heart Failure,  Congestive Heart Failure, Type II Diabetes, Lupus Type II Diabetes, Lupus Erythematosus, Osteoarthritis, Dementia Osteoarthritis, Dementia] [4:Chronic Obstructive Pulmonary Disease (COPD), Angina, Erythematosus,] Date Acquired: [2:10/27/2015] [3:11/03/2015] [4:11/03/2015] Weeks of Treatment: [2:1] [3:1] [4:1] Wound Status: [2:Open] [3:Open] [4:Open] Measurements L x W x D 1x1x0.1 [3:0.8x0.7x0.1] [4:1x2.5x0.1] (cm) Area (cm) : [2:0.785] [3:0.44] [4:1.963] Volume (cm) : [2:0.079] [3:0.044] [4:0.196] % Reduction in Area: [2:-1005.60%] [3:-3.80%] [4:19.90%] % Reduction in Volume: -1028.60%  [3:-4.80%] [4:20.00%] Classification: [2:Grade 1] [3:Grade 1] [4:Grade 1] Exudate Amount: [2:Medium] [3:Medium] [4:Medium] Exudate Type: [2:Serosanguineous] [3:Serosanguineous] [4:Serosanguineous] Exudate Color: [2:red, brown] [3:red, brown] [4:red, brown] Foul Odor After [2:No] [3:No] [4:No] Cleansing: Odor Anticipated Due to N/A [3:N/A] [4:N/A] Product Use: Wound Margin: [2:Distinct, outline attached] [3:Distinct, outline attached Distinct, outline attached] Granulation Amount: Large (67-100%) Medium (34-66%) Small (1-33%) Granulation Quality: N/A Pink, Pale Pink, Pale Necrotic Amount: None Present (0%) Medium (34-66%) Large (67-100%) Necrotic Tissue: N/A Adherent Slough Adherent Slough Exposed Structures: Fascia: No Fascia: No Fascia: No Fat: No Fat: No Fat: No Tendon: No Tendon: No Tendon: No Muscle: No Muscle: No Muscle: No Joint: No Joint: No Joint: No Bone: No Bone: No Bone: No Limited to Skin Limited to Skin Limited to Skin Breakdown Breakdown Breakdown Epithelialization: None None None Periwound Skin Texture: Edema: Yes Edema: Yes Edema: Yes Excoriation: No Excoriation: No Excoriation: No Induration: No Induration: No Induration: No Callus: No Callus: No Callus: No Crepitus: No Crepitus: No Crepitus: No Fluctuance: No Fluctuance: No Fluctuance: No Friable: No Friable: No Friable: No Rash: No Rash: No Rash: No Scarring: No Scarring: No Scarring: No Periwound Skin Moist: Yes Moist: Yes Moist: Yes Moisture: Maceration: No Maceration: No Maceration: No Dry/Scaly: No Dry/Scaly: No Dry/Scaly: No Periwound Skin Color: Atrophie Blanche: No Atrophie Blanche: No Atrophie Blanche: No Cyanosis: No Cyanosis: No Cyanosis: No Ecchymosis: No Ecchymosis: No Ecchymosis: No Erythema: No Erythema: No Erythema: No Hemosiderin Staining: No Hemosiderin Staining: No Hemosiderin Staining: No Mottled: No Mottled: No Mottled: No Pallor: No Pallor:  No Pallor: No Rubor: No Rubor: No Rubor: No Temperature: No Abnormality No Abnormality No Abnormality Tenderness on Yes Yes Yes Palpation: Wound Preparation: Ulcer Cleansing: Ulcer Cleansing: Ulcer Cleansing: Rinsed/Irrigated with Rinsed/Irrigated with Rinsed/Irrigated with Saline Saline Saline Topical Anesthetic Topical Anesthetic Topical Anesthetic Applied: Other: lidocaine Applied: Other: lidocaine Applied: Other: lidocaine 4% 4% 4% Wound Number: 5 N/A N/A Photos: No Photos N/A N/A Wound Location: Right Calcaneus - N/A N/A Posterior Wounding Event: Gradually Appeared N/A N/A Primary Etiology: N/A N/A LURETTA, GAMMEL ([id number]) Diabetic Wound/Ulcer of the Lower Extremity Comorbid History: Chronic Obstructive N/A N/A Pulmonary Disease (COPD), Angina, Congestive Heart Failure, Type II Diabetes, Lupus Erythematosus, Osteoarthritis, Dementia Date Acquired: 11/03/2015 N/A N/A Weeks of Treatment: 1 N/A N/A Wound Status: Open N/A N/A Measurements L x W x D 3.5x2.5x0.1 N/A N/A (cm) Area (cm) : [id number] N/A N/A Volume (cm) : 0.687 N/A N/A % Reduction in Area: 28.00% N/A N/A % Reduction in Volume: 28.10% N/A N/A Classification: Grade 1 N/A N/A Exudate Amount: Medium N/A N/A Exudate Type: Serosanguineous N/A N/A Exudate Color: red, brown N/A N/A Foul Odor After Yes N/A N/A Cleansing: Odor Anticipated Due to No N/A N/A Product Use: Wound Margin: Distinct, outline  attached N/A N/A Granulation Amount: None Present (0%) N/A N/A Granulation Quality: N/A N/A N/A Necrotic Amount: Large (67-100%) N/A N/A Necrotic Tissue: Eschar, Adherent Slough N/A N/A Exposed Structures: Fascia: No N/A N/A Fat: No Tendon: No Muscle: No Joint: No Bone: No Limited to Skin Breakdown Epithelialization: None N/A N/A Periwound Skin Texture: Edema: No N/A N/A Excoriation: No Induration: No Callus: No Crepitus: No Fluctuance: No Friable: No Rash: No Scarring: No N/A N/A ENDIA, PRIER ([id number]) Periwound Skin Moist: Yes Moisture: Maceration: No Dry/Scaly: No Periwound Skin Color: Mottled: Yes N/A N/A Atrophie Blanche: No Cyanosis: No Ecchymosis: No Erythema: No Hemosiderin Staining: No Pallor: No Rubor: No Temperature: No Abnormality N/A N/A Tenderness on No N/A N/A Palpation: Wound Preparation: Ulcer Cleansing: N/A N/A Rinsed/Irrigated with Saline Topical Anesthetic Applied: Other: lidocaine 4% Treatment Notes Electronic Signature(s) Signed: 12/09/2015 2:48:50 PM By: Regan Lemming BSN, RN Entered By: Regan Lemming on 12/09/2015 14:48:50 [name] ([id number]) -------------------------------------------------------------------------------- [place] Details Patient Name: [name] Date of Service: 12/09/2015 2:15 PM Medical Record Number: [id number] Patient Account Number: [id number] Date of Birth/Sex: 02/12/1936 (80 y.o. Female) Treating RN: Baruch Gouty, RN, BSN, [name] Primary Care Physician: Halina Maidens Other Clinician: Referring Physician: Halina Maidens Treating Physician/Extender: Frann Rider in Treatment: 1 Active Inactive Abuse / Safety / Falls / Self Care Management Nursing Diagnoses: Potential for falls Goals: Patient/caregiver will verbalize understanding of skin care regimen Date Initiated: 12/02/2015 Goal Status: Active Patient/caregiver will verbalize/demonstrate measures taken to prevent injury and/or falls Date Initiated: 12/02/2015 Goal Status: Active Interventions: Assess fall risk on admission and as needed Assess: immobility, friction, shearing, incontinence upon admission and as needed Assess impairment of mobility on admission and as needed per policy Assess self care needs on admission and as needed Provide education on fall prevention Provide education on safe transfers Notes: Wound/Skin Impairment Nursing Diagnoses: Impaired tissue integrity Goals: Patient/caregiver  will verbalize understanding of skin care regimen Date Initiated: 12/02/2015 Goal Status: Active Interventions: Assess patient/caregiver ability to obtain necessary supplies Assess patient/caregiver ability to perform ulcer/skin care regimen upon admission and as needed TAWYNA, BETTERS ([id number]) Assess ulceration(s) every visit Provide education on ulcer and skin care Notes: Electronic Signature(s) Signed: 12/09/2015 2:48:41 PM By: Regan Lemming BSN, RN Entered By: Regan Lemming on 12/09/2015 14:48:41 [name] ([id number]) -------------------------------------------------------------------------------- Pain Assessment Details Patient Name: [name] Date of Service: 12/09/2015 2:15 PM Medical Record Number: [id number] Patient Account Number: [id number] Date of Birth/Sex: Jun 03, 1936 (80 y.o. Female) Treating RN: Baruch Gouty, RN, BSN, [name] Primary Care Physician: Halina Maidens Other Clinician: Referring Physician: Halina Maidens Treating Physician/Extender: Frann Rider in Treatment: 1 Active Problems Location of Pain Severity and Description of Pain Patient Has Paino Yes Site Locations Pain Location: Generalized Pain, Pain in Ulcers With Dressing Change: Yes Rate the pain. Current Pain Level: 5 Worst Pain Level: 6 Character of Pain Describe the Pain: Aching, Tender Pain Management and Medication Current Pain Management: How does your pain impact your activities of daily livingo Sleep: Yes Bathing: Yes Appetite: Yes Relationship With Others: Yes Bladder Continence: Yes Emotions: Yes Bowel Continence: Yes Work: Yes Toileting: Yes Drive: Yes Dressing: Yes Hobbies: Yes Electronic Signature(s) Signed: 12/09/2015 2:28:21 PM By: Regan Lemming BSN, RN Entered By: Regan Lemming on 12/09/2015 14:28:21 [name] ([id number]) -------------------------------------------------------------------------------- Patient/Caregiver Education Details Patient  Name: [name] Date of Service: 12/09/2015 2:15 PM Medical Record Number: [id number] Patient Account Number: [id number] Date of Birth/Gender: 1936/04/02 (80 y.o. Female) Treating RN:  Afful, RN, BSN, [name] Primary Care Physician: Halina Maidens Other Clinician: Referring Physician: Halina Maidens Treating Physician/Extender: Frann Rider in Treatment: 1 Education Assessment Education Provided To: Patient Education Topics Provided Safety: Methods: Explain/Verbal Responses: State content correctly Wound/Skin Impairment: Methods: Explain/Verbal Responses: State content correctly Electronic Signature(s) Signed: 12/09/2015 6:07:05 PM By: Regan Lemming BSN, RN Entered By: Regan Lemming on 12/09/2015 16:20:27 [name] ([id number]) -------------------------------------------------------------------------------- Wound Assessment Details Patient Name: [name] Date of Service: 12/09/2015 2:15 PM Medical Record Number: [id number] Patient Account Number: [id number] Date of Birth/Sex: February 18, 1936 (80 y.o. Female) Treating RN: Afful, RN, BSN, [place] Primary Care Physician: Halina Maidens Other Clinician: Referring Physician: Halina Maidens Treating Physician/Extender: Frann Rider in Treatment: 1 Wound Status Wound Number: 2 Primary Diabetic Wound/Ulcer of the Lower Etiology: Extremity Wound Location: Left Lower Leg - Anterior Wound Open Wounding Event: Gradually Appeared Status: Date Acquired: 10/27/2015 Comorbid Chronic Obstructive Pulmonary Disease Weeks Of Treatment: 1 History: (COPD), Angina, Congestive Heart Clustered Wound: No Failure, Type II Diabetes, Lupus Erythematosus, Osteoarthritis, Dementia Photos Photo Uploaded By: Regan Lemming on 12/09/2015 16:32:16 Wound Measurements Length: (cm) 1 Width: (cm) 1 Depth: (cm) 0.1 Area: (cm) 0.785 Volume: (cm) 0.079 % Reduction in Area: -1005.6% % Reduction in Volume:  -1028.6% Epithelialization: None Tunneling: No Undermining: No Wound Description Classification: Grade 1 Wound Margin: Distinct, outline attached Exudate Amount: Medium Exudate Type: Serosanguineous Exudate Color: red, brown Foul Odor After Cleansing: No Wound Bed Granulation Amount: Large (67-100%) Exposed Structure Necrotic Amount: None Present (0%) Fascia Exposed: No Fat Layer Exposed: No EMORY, PRASHAD ([id number]) Tendon Exposed: No Muscle Exposed: No Joint Exposed: No Bone Exposed: No Limited to Skin Breakdown Periwound Skin Texture Texture Color No Abnormalities Noted: No No Abnormalities Noted: No Callus: No Atrophie Blanche: No Crepitus: No Cyanosis: No Excoriation: No Ecchymosis: No Fluctuance: No Erythema: No Friable: No Hemosiderin Staining: No Induration: No Mottled: No Localized Edema: Yes Pallor: No Rash: No Rubor: No Scarring: No Temperature / Pain Moisture Temperature: No Abnormality No Abnormalities Noted: No Tenderness on Palpation: Yes Dry / Scaly: No Maceration: No Moist: Yes Wound Preparation Ulcer Cleansing: Rinsed/Irrigated with Saline Topical Anesthetic Applied: Other: lidocaine 4%, Treatment Notes Wound #2 (Left, Anterior Lower Leg) 1. Cleansed with: Clean wound with Normal Saline 4. Dressing Applied: Aquacel Ag 5. Secondary Dressing Applied Bordered Foam Dressing Notes border foam Electronic Signature(s) Signed: 12/09/2015 6:07:05 PM By: Regan Lemming BSN, RN Entered By: Regan Lemming on 12/09/2015 14:46:33 [name] ([id number]) -------------------------------------------------------------------------------- Wound Assessment Details Patient Name: [name] Date of Service: 12/09/2015 2:15 PM Medical Record Number: [id number] Patient Account Number: [id number] Date of Birth/Sex: Feb 25, 1936 (80 y.o. Female) Treating RN: Afful, RN, BSN, [place] Primary Care Physician: Halina Maidens Other Clinician: Referring  Physician: Halina Maidens Treating Physician/Extender: Frann Rider in Treatment: 1 Wound Status Wound Number: 3 Primary Diabetic Wound/Ulcer of the Lower Etiology: Extremity Wound Location: Right Lower Leg - Anterior Wound Open Wounding Event: Gradually Appeared Status: Date Acquired: 11/03/2015 Comorbid Chronic Obstructive Pulmonary Disease Weeks Of Treatment: 1 History: (COPD), Angina, Congestive Heart Clustered Wound: No Failure, Type II Diabetes, Lupus Erythematosus, Osteoarthritis, Dementia Photos Photo Uploaded By: Regan Lemming on 12/09/2015 16:32:17 Wound Measurements Length: (cm) 0.8 Width: (cm) 0.7 Depth: (cm) 0.1 Area: (cm) 0.44 Volume: (cm) 0.044 % Reduction in Area: -3.8% % Reduction in Volume: -4.8% Epithelialization: None Tunneling: No Undermining: No Wound Description Classification: Grade 1 Wound Margin: Distinct, outline attached Exudate Amount: Medium Exudate Type: Serosanguineous Exudate Color: red, brown Foul  Odor After Cleansing: No Wound Bed Granulation Amount: Medium (34-66%) Exposed Structure Granulation Quality: Pink, Pale Fascia Exposed: No Necrotic Amount: Medium (34-66%) Fat Layer Exposed: No AMBERDAWN, ROSENBURG ([id number]) Necrotic Quality: Adherent Slough Tendon Exposed: No Muscle Exposed: No Joint Exposed: No Bone Exposed: No Limited to Skin Breakdown Periwound Skin Texture Texture Color No Abnormalities Noted: No No Abnormalities Noted: No Callus: No Atrophie Blanche: No Crepitus: No Cyanosis: No Excoriation: No Ecchymosis: No Fluctuance: No Erythema: No Friable: No Hemosiderin Staining: No Induration: No Mottled: No Localized Edema: Yes Pallor: No Rash: No Rubor: No Scarring: No Temperature / Pain Moisture Temperature: No Abnormality No Abnormalities Noted: No Tenderness on Palpation: Yes Dry / Scaly: No Maceration: No Moist: Yes Wound Preparation Ulcer Cleansing: Rinsed/Irrigated with  Saline Topical Anesthetic Applied: Other: lidocaine 4%, Treatment Notes Wound #3 (Right, Anterior Lower Leg) 1. Cleansed with: Clean wound with Normal Saline 4. Dressing Applied: Santyl Ointment 5. Secondary Dressing Applied Bordered Foam Dressing Dry Gauze Electronic Signature(s) Signed: 12/09/2015 6:07:05 PM By: Regan Lemming BSN, RN Entered By: Regan Lemming on 12/09/2015 14:47:01 [name] ([id number]) -------------------------------------------------------------------------------- Wound Assessment Details Patient Name: [name] Date of Service: 12/09/2015 2:15 PM Medical Record Number: [id number] Patient Account Number: [id number] Date of Birth/Sex: [birth year] (80 y.o. Female) Treating RN: Afful, RN, BSN, [place] Primary Care Physician: Halina Maidens Other Clinician: Referring Physician: Halina Maidens Treating Physician/Extender: Frann Rider in Treatment: 1 Wound Status Wound Number: 4 Primary Diabetic Wound/Ulcer of the Lower Etiology: Extremity Wound Location: Right Lower Leg - Medial Wound Open Wounding Event: Gradually Appeared Status: Date Acquired: 11/03/2015 Comorbid Chronic Obstructive Pulmonary Disease Weeks Of Treatment: 1 History: (COPD), Angina, Congestive Heart Clustered Wound: No Failure, Type II Diabetes, Lupus Erythematosus, Osteoarthritis, Dementia Photos Photo Uploaded By: Regan Lemming on 12/09/2015 16:32:34 Wound Measurements Length: (cm) 1 Width: (cm) 2.5 Depth: (cm) 0.1 Area: (cm) 1.963 Volume: (cm) 0.196 % Reduction in Area: 19.9% % Reduction in Volume: 20% Epithelialization: None Tunneling: No Undermining: No Wound Description Classification: Grade 1 Wound Margin: Distinct, outline attached Exudate Amount: Medium MAJOR, HAEGELE ([id number]) Foul Odor After Cleansing: No Exudate Type: Serosanguineous Exudate Color: red, brown Wound Bed Granulation Amount: Small (1-33%) Exposed Structure Granulation  Quality: Pink, Pale Fascia Exposed: No Necrotic Amount: Large (67-100%) Fat Layer Exposed: No Necrotic Quality: Adherent Slough Tendon Exposed: No Muscle Exposed: No Joint Exposed: No Bone Exposed: No Limited to Skin Breakdown Periwound Skin Texture Texture Color No Abnormalities Noted: No No Abnormalities Noted: No Callus: No Atrophie Blanche: No Crepitus: No Cyanosis: No Excoriation: No Ecchymosis: No Fluctuance: No Erythema: No Friable: No Hemosiderin Staining: No Induration: No Mottled: No Localized Edema: Yes Pallor: No Rash: No Rubor: No Scarring: No Temperature / Pain Moisture Temperature: No Abnormality No Abnormalities Noted: No Tenderness on Palpation: Yes Dry / Scaly: No Maceration: No Moist: Yes Wound Preparation Ulcer Cleansing: Rinsed/Irrigated with Saline Topical Anesthetic Applied: Other: lidocaine 4%, Treatment Notes Wound #4 (Right, Medial Lower Leg) 1. Cleansed with: Clean wound with Normal Saline 4. Dressing Applied: Santyl Ointment 5. Secondary Dressing Applied Bordered Foam Dressing Dry Gauze Electronic Signature(s) Signed: 12/09/2015 6:07:05 PM By: Regan Lemming BSN, RN [name] ([id number]) Entered By: Regan Lemming on 12/09/2015 14:47:57 [name] ([id number]) -------------------------------------------------------------------------------- Wound Assessment Details Patient Name: [name] Date of Service: 12/09/2015 2:15 PM Medical Record Number: [id number] Patient Account Number: [id number] Date of Birth/Sex: 1935-07-16 (80 y.o. Female) Treating RN: Baruch Gouty, RN, BSN, [name] Primary Care Physician: Halina Maidens Other Clinician:  Referring Physician: Halina Maidens Treating Physician/Extender: Frann Rider in Treatment: 1 Wound Status Wound Number: 5 Primary Diabetic Wound/Ulcer of the Lower Etiology: Extremity Wound Location: Right Calcaneus - Posterior Wound Open Wounding Event: Gradually  Appeared Status: Date Acquired: 11/03/2015 Comorbid Chronic Obstructive Pulmonary Disease Weeks Of Treatment: 1 History: (COPD), Angina, Congestive Heart Clustered Wound: No Failure, Type II Diabetes, Lupus Erythematosus, Osteoarthritis, Dementia Photos Photo Uploaded By: Regan Lemming on 12/09/2015 16:32:35 Wound Measurements Length: (cm) 3.5 Width: (cm) 2.5 Depth: (cm) 0.1 Area: (cm) 6.872 Volume: (cm) 0.687 % Reduction in Area: 28% % Reduction in Volume: 28.1% Epithelialization: None Tunneling: No Undermining: No Wound Description Classification: Grade 1 Wound Margin: Distinct, outline attached Exudate Amount: Medium Exudate Type: Serosanguineous Exudate Color: red, brown Foul Odor After Cleansing: Yes Due to Product Use: No Wound Bed Granulation Amount: None Present (0%) Exposed Structure Necrotic Amount: Large (67-100%) Fascia Exposed: No Necrotic Quality: Eschar, Adherent Slough Fat Layer Exposed: No DESYREE, MEIDINGER ([id number]) Tendon Exposed: No Muscle Exposed: No Joint Exposed: No Bone Exposed: No Limited to Skin Breakdown Periwound Skin Texture Texture Color No Abnormalities Noted: No No Abnormalities Noted: No Callus: No Atrophie Blanche: No Crepitus: No Cyanosis: No Excoriation: No Ecchymosis: No Fluctuance: No Erythema: No Friable: No Hemosiderin Staining: No Induration: No Mottled: Yes Localized Edema: No Pallor: No Rash: No Rubor: No Scarring: No Temperature / Pain Moisture Temperature: No Abnormality No Abnormalities Noted: No Dry / Scaly: No Maceration: No Moist: Yes Wound Preparation Ulcer Cleansing: Rinsed/Irrigated with Saline Topical Anesthetic Applied: Other: lidocaine 4%, Treatment Notes Wound #5 (Right, Posterior Calcaneus) 1. Cleansed with: Clean wound with Normal Saline 4. Dressing Applied: Santyl Ointment 5. Secondary Dressing Applied Bordered Foam Dressing Dry Gauze Electronic Signature(s) Signed:  12/09/2015 6:07:05 PM By: Regan Lemming BSN, RN Entered By: Regan Lemming on 12/09/2015 14:48:31 [name] ([id number]) -------------------------------------------------------------------------------- [place] Details Patient Name: [name] Date of Service: 12/09/2015 2:15 PM Medical Record Number: [id number] Patient Account Number: [id number] Date of Birth/Sex: [birth year] (80 y.o. Female) Treating RN: Afful, RN, BSN, [place] Primary Care Physician: Halina Maidens Other Clinician: Referring Physician: Halina Maidens Treating Physician/Extender: Frann Rider in Treatment: 1 Vital Signs Time Taken: 14:35 Temperature (F): 98.1 Height (in): 64 Pulse (bpm): 63 Weight (lbs): 110 Respiratory Rate (breaths/min): 16 Body Mass Index (BMI): 18.9 Blood Pressure (mmHg): 139/59 Reference Range: 80 - 120 mg / dl Electronic Signature(s) Signed: 12/09/2015 6:07:05 PM By: Regan Lemming BSN, RN Entered By: Regan Lemming on 12/09/2015 14:35:34

## 2015-12-10 NOTE — Progress Notes (Signed)
JNAYA, MAGLEY ([id number]) Visit Report for 12/09/2015 Chief Complaint Document Details Patient Name: [name], [name]. Date of Service: 12/09/2015 2:15 PM Medical Record Patient Account Number: [id number] Number: Afful, RN, BSN, Treating RN: Apr 04, 1936 (80 y.o. [name] Date of Birth/Sex: Female) Other Clinician: Primary Care Physician: Halina Maidens Treating [name] Referring Physician: Halina Maidens Physician/Extender: Suella Grove in Treatment: 1 Information Obtained from: Patient Chief Complaint Patient is at the clinic for treatment of an open pressure ulcer to her right calcaneum which has been there for about 4 weeks Electronic Signature(s) Signed: 12/09/2015 3:14:51 PM By: [name], FACS Entered By: [name] on 12/09/2015 15:14:51 [name] ([id number]) -------------------------------------------------------------------------------- Debridement Details Patient Name: [name]. Date of Service: 12/09/2015 2:15 PM Medical Record Patient Account Number: [id number] Number: Afful, RN, BSN, Treating RN: [birth year] (80 y.o. [name] Date of Birth/Sex: Female) Other Clinician: Primary Care Physician: Halina Maidens Treating [name] Referring Physician: Halina Maidens Physician/Extender: Suella Grove in Treatment: 1 Debridement Performed for Wound #4 Right,Medial Lower Leg Assessment: Performed By: Physician [name], MD Debridement: Debridement Pre-procedure Yes Verification/Time Out Taken: Start Time: 15:05 Pain Control: Lidocaine 4% Topical Solution Level: Skin/Subcutaneous Tissue Total Area Debrided (L x 1 (cm) x 2.5 (cm) = 2.5 (cm) W): Tissue and other Viable, Non-Viable, Exudate, Fibrin/Slough, Skin, Subcutaneous material debrided: Instrument: Forceps, Scissors Bleeding: Minimum Hemostasis Achieved: Pressure End Time: 15:05 Procedural Pain: 0 Post Procedural Pain: 0 Response to Treatment: Procedure was  tolerated well Post Debridement Measurements of Total Wound Length: (cm) 1 Width: (cm) 2.5 Depth: (cm) 0.1 Volume: (cm) 0.196 Post Procedure Diagnosis Same as Pre-procedure Electronic Signature(s) Signed: 12/09/2015 3:14:32 PM By: [name], FACS Signed: 12/09/2015 6:07:05 PM By: Regan Lemming BSN, RN Entered By: [name] on 12/09/2015 15:14:32 [name] ([id number]) -------------------------------------------------------------------------------- Debridement Details Patient Name: [name]. Date of Service: 12/09/2015 2:15 PM Medical Record Patient Account Number: [id number] Number: Afful, RN, BSN, Treating RN: 26-Jul-1935 (80 y.o. [name] Date of Birth/Sex: Female) Other Clinician: Primary Care Physician: Halina Maidens Treating [name] Referring Physician: Halina Maidens Physician/Extender: Suella Grove in Treatment: 1 Debridement Performed for Wound #5 Right,Posterior Calcaneus Assessment: Performed By: Physician [name], MD Debridement: Debridement Pre-procedure Yes Verification/Time Out Taken: Start Time: 15:00 Pain Control: Lidocaine 4% Topical Solution Level: Skin/Subcutaneous Tissue Total Area Debrided (L x 3.5 (cm) x 2.5 (cm) = 8.75 (cm) W): Tissue and other Viable, Non-Viable, Exudate, Fibrin/Slough, Subcutaneous material debrided: Instrument: Curette, Forceps, Scissors Bleeding: Minimum Hemostasis Achieved: Pressure End Time: 15:05 Procedural Pain: 0 Post Procedural Pain: 0 Response to Treatment: Procedure was tolerated well Post Debridement Measurements of Total Wound Length: (cm) 3.5 Width: (cm) 2.5 Depth: (cm) 0.1 Volume: (cm) 0.687 Post Procedure Diagnosis Same as Pre-procedure Electronic Signature(s) Signed: 12/09/2015 3:14:44 PM By: [name], FACS Signed: 12/09/2015 6:07:05 PM By: Regan Lemming BSN, RN Entered By: [name] on 12/09/2015 15:14:44 [name]  ([id number]) -------------------------------------------------------------------------------- HPI Details Patient Name: [name] Date of Service: 12/09/2015 2:15 PM Medical Record Patient Account Number: [id number] Number: Afful, RN, BSN, Treating RN: Aug 29, 1935 (80 y.o. [name] Date of Birth/Sex: Female) Other Clinician: Primary Care Physician: Halina Maidens Treating [name] Referring Physician: Halina Maidens Physician/Extender: Suella Grove in Treatment: 1 History of Present Illness Location: right posterior heel, and both lower extremities Quality: Patient reports experiencing a sharp pain to affected area(s). Severity: Patient states wound (s) are getting better. Duration: Patient has had the wound for < 4 weeks prior to presenting for  treatment Timing: Pain in wound is Intermittent (comes and goes Context: The wound appeared gradually over time while she was in hospital with a right hip fracture Modifying Factors: Consults to this date include:was seen by her PCP and put on some antibiotics a while ago Associated Signs and Symptoms: Patient reports having difficulty standing for long periods. HPI Description: 80 year old patient referred who was recently discharged in April of this year with a left heel ulcer now comes with a right heel ulcer and some ulcerations in both lower extremities which have all come along during her recent hospitalization which she had a right hip fracture. She has a past medical history of COPD, diabetes mellitus type 2 with chronic kidney disease, multi-infarct dementia, anxiety, urinary retention, paroxysmal atrial fibrillation. She has also had moderate malnutrition, multi-infarct dementia, congestive heart failure, left displaced femoral neck fracture, systemic lupus erythematosus. She is also status total abdominal hysterectomy with bilateral salpingo-oophorectomy, EGDs, hip arthroplasty on the left, cataract surgery. She has been  a from a smoker and quit in May 2013 and she smoked for about 40 years. recently admitted to [facility] between January 19 and 07/03/2015 12/09/2015 -- o right foot x-ray -- IMPRESSION:Soft tissue ulceration of the heel. No underlying erosive bony abnormality. Diffuse degenerative change. No acute bony abnormality. Electronic Signature(s) Signed: 12/09/2015 3:19:14 PM By: [name], FACS Previous Signature: 12/09/2015 3:14:58 PM Version By: [name], FACS Entered By: [name] on 12/09/2015 15:19:14 [name] ([id number]) -------------------------------------------------------------------------------- Physical Exam Details Patient Name: [name] Date of Service: 12/09/2015 2:15 PM Medical Record Patient Account Number: [id number] Number: Afful, RN, BSN, Treating RN: March 19, 1936 (80 y.o. [name] Date of Birth/Sex: Female) Other Clinician: Primary Care Physician: Halina Maidens Treating [name] Referring Physician: Halina Maidens Physician/Extender: Suella Grove in Treatment: 1 Constitutional . Pulse regular. Respirations normal and unlabored. Afebrile. . Eyes Nonicteric. Reactive to light. Ears, Nose, Mouth, and Throat Lips, teeth, and gums WNL.Marland Kitchen Moist mucosa without lesions. Neck supple and nontender. No palpable supraclavicular or cervical adenopathy. Normal sized without goiter. Respiratory WNL. No retractions.. Cardiovascular Pedal Pulses WNL. No clubbing, cyanosis or edema. Lymphatic No adneopathy. No adenopathy. No adenopathy. Musculoskeletal Adexa without tenderness or enlargement.. Digits and nails w/o clubbing, cyanosis, infection, petechiae, ischemia, or inflammatory conditions.. Integumentary (Hair, Skin) No suspicious lesions. No crepitus or fluctuance. No peri-wound warmth or erythema. No masses.Marland Kitchen Psychiatric Judgement and insight Intact.. No evidence of depression, anxiety, or  agitation.. Notes using forceps and scissors I was able to sharply debride a lot of the debris from the right heel and the right anterior ankle area. Minimal bleeding controlled with pressure. Electronic Signature(s) Signed: 12/09/2015 3:15:39 PM By: [name], FACS Entered By: [name] on 12/09/2015 15:15:38 [name] ([id number]) -------------------------------------------------------------------------------- Physician Orders Details Patient Name: [name] Date of Service: 12/09/2015 2:15 PM Medical Record Patient Account Number: [id number] Number: Afful, RN, BSN, Treating RN: [birth year] (80 y.o. [name] Date of Birth/Sex: Female) Other Clinician: Primary Care Physician: Halina Maidens Treating [name] Referring Physician: Halina Maidens Physician/Extender: Suella Grove in Treatment: 1 Verbal / Phone Orders: Yes Clinician: Afful, RN, BSN, Rita Read Back and Verified: Yes Diagnosis Coding Wound Cleansing Wound #2 Left,Anterior Lower Leg o Clean wound with Normal Saline. - in clinic o Cleanse wound with mild soap and water Wound #3 Right,Anterior Lower Leg o Clean wound with Normal Saline. - in clinic o Cleanse wound with mild soap and water Wound #4 Right,Medial Lower Leg   o Clean wound with Normal Saline. - in clinic o Cleanse wound with mild soap and water Wound #5 Right,Posterior Calcaneus o Clean wound with Normal Saline. - in clinic o Cleanse wound with mild soap and water Anesthetic Wound #2 Left,Anterior Lower Leg o Topical Lidocaine 4% cream applied to wound bed prior to debridement Wound #3 Right,Anterior Lower Leg o Topical Lidocaine 4% cream applied to wound bed prior to debridement Wound #4 Right,Medial Lower Leg o Topical Lidocaine 4% cream applied to wound bed prior to debridement Wound #5 Right,Posterior Calcaneus o Topical Lidocaine 4% cream applied to wound bed prior to debridement Primary  Wound Dressing Wound #2 Left,Anterior Lower Leg o Aquacel Ag Wound #3 Right,Anterior Lower Leg [name], [name] ([id number]) o Santyl Ointment Wound #4 Right,Medial Lower Leg o Santyl Ointment Wound #5 Right,Posterior Calcaneus o Santyl Ointment Secondary Dressing Wound #3 Right,Anterior Lower Leg o Boardered Foam Dressing Wound #4 Right,Medial Lower Leg o Boardered Foam Dressing Wound #5 Right,Posterior Calcaneus o Dry Gauze o Foam - foam heel cup Dressing Change Frequency Wound #2 Left,Anterior Lower Leg o Change dressing every day. Wound #3 Right,Anterior Lower Leg o Change dressing every day. Wound #4 Right,Medial Lower Leg o Change dressing every day. Wound #5 Right,Posterior Calcaneus o Change dressing every day. Follow-up Appointments o Return Appointment in 1 week. Off-Loading o Other: - float heels with pillow under calves when in bed; wear pressure relieving boots while in bed Electronic Signature(s) Signed: 12/09/2015 4:10:50 PM By: [name], FACS Signed: 12/09/2015 6:07:05 PM By: Regan Lemming BSN, RN Entered By: Regan Lemming on 12/09/2015 15:10:33 [name] ([id number]) -------------------------------------------------------------------------------- Problem List Details Patient Name: [name]. Date of Service: 12/09/2015 2:15 PM Medical Record Patient Account Number: [id number] Number: Afful, RN, BSN, Treating RN: 1936/04/06 (79 y.o. [name] Date of Birth/Sex: Female) Other Clinician: Primary Care Physician: Halina Maidens Treating [name] Referring Physician: Halina Maidens Physician/Extender: Suella Grove in Treatment: 1 Active Problems ICD-10 Encounter Code Description Active Date Diagnosis E11.621 Type 2 diabetes mellitus with foot ulcer 12/02/2015 Yes L89.613 Pressure ulcer of right heel, stage 3 12/02/2015 Yes L97.212 Non-pressure chronic ulcer of right calf with fat layer 12/02/2015  Yes exposed L97.211 Non-pressure chronic ulcer of right calf limited to 12/02/2015 Yes breakdown of skin [name] Long term (current) use of anticoagulants 12/02/2015 Yes Inactive Problems Resolved Problems Electronic Signature(s) Signed: 12/09/2015 3:14:17 PM By: [name], FACS Entered By: [name] on 12/09/2015 15:14:16 [name] ([id number]) -------------------------------------------------------------------------------- Progress Note Details Patient Name: [name] Date of Service: 12/09/2015 2:15 PM Medical Record Patient Account Number: [id number] Number: Afful, RN, BSN, Treating RN: December 18, 1935 (80 y.o. [name] Date of Birth/Sex: Female) Other Clinician: Primary Care Physician: Halina Maidens Treating [name] Referring Physician: Halina Maidens Physician/Extender: Suella Grove in Treatment: 1 Subjective Chief Complaint Information obtained from Patient Patient is at the clinic for treatment of an open pressure ulcer to her right calcaneum which has been there for about 4 weeks History of Present Illness (HPI) The following HPI elements were documented for the patient's wound: Location: right posterior heel, and both lower extremities Quality: Patient reports experiencing a sharp pain to affected area(s). Severity: Patient states wound (s) are getting better. Duration: Patient has had the wound for < 4 weeks prior to presenting for treatment Timing: Pain in wound is Intermittent (comes and goes Context: The wound appeared gradually over time while she was in hospital with a right hip fracture Modifying Factors: Consults to this date  include:was seen by her PCP and put on some antibiotics a while ago Associated Signs and Symptoms: Patient reports having difficulty standing for long periods. 80 year old patient referred who was recently discharged in April of this year with a left heel ulcer now comes with a right heel ulcer and some  ulcerations in both lower extremities which have all come along during her recent hospitalization which she had a right hip fracture. She has a past medical history of COPD, diabetes mellitus type 2 with chronic kidney disease, multi-infarct dementia, anxiety, urinary retention, paroxysmal atrial fibrillation. She has also had moderate malnutrition, multi-infarct dementia, congestive heart failure, left displaced femoral neck fracture, systemic lupus erythematosus. She is also status total abdominal hysterectomy with bilateral salpingo-oophorectomy, EGDs, hip arthroplasty on the left, cataract surgery. She has been a from a smoker and quit in May 2013 and she smoked for about 40 years. recently admitted to [facility] between January 19 and 07/03/2015 12/09/2015 -- right foot x-ray -- IMPRESSION:Soft tissue ulceration of the heel. No underlying erosive bony abnormality. Diffuse degenerative change. No acute bony abnormality. [name], [name] ([id number]) Objective Constitutional Pulse regular. Respirations normal and unlabored. Afebrile. Vitals Time Taken: 2:35 PM, Height: 64 in, Weight: 110 lbs, BMI: 18.9, Temperature: 98.1 F, Pulse: 63 bpm, Respiratory Rate: 16 breaths/min, Blood Pressure: 139/59 mmHg. Eyes Nonicteric. Reactive to light. Ears, Nose, Mouth, and Throat Lips, teeth, and gums WNL.Marland Kitchen Moist mucosa without lesions. Neck supple and nontender. No palpable supraclavicular or cervical adenopathy. Normal sized without goiter. Respiratory WNL. No retractions.. Cardiovascular Pedal Pulses WNL. No clubbing, cyanosis or edema. Lymphatic No adneopathy. No adenopathy. No adenopathy. Musculoskeletal Adexa without tenderness or enlargement.. Digits and nails w/o clubbing, cyanosis, infection, petechiae, ischemia, or inflammatory conditions.Marland Kitchen Psychiatric Judgement and insight Intact.. No evidence of depression, anxiety, or agitation.. General Notes: using forceps  and scissors I was able to sharply debride a lot of the debris from the right heel and the right anterior ankle area. Minimal bleeding controlled with pressure. Integumentary (Hair, Skin) No suspicious lesions. No crepitus or fluctuance. No peri-wound warmth or erythema. No masses.. Wound #2 status is Open. Original cause of wound was Gradually Appeared. The wound is located on the Left,Anterior Lower Leg. The wound measures 1cm length x 1cm width x 0.1cm depth; 0.785cm^2 area and 0.079cm^3 volume. The wound is limited to skin breakdown. There is no tunneling or undermining noted. There is a medium amount of serosanguineous drainage noted. The wound margin is distinct with the outline attached to the wound base. There is large (67-100%) granulation within the wound bed. There is no necrotic tissue within the wound bed. The periwound skin appearance exhibited: Localized Edema, Moist. The periwound skin appearance did not exhibit: Callus, Crepitus, Excoriation, Fluctuance, Friable, Induration, Rash, Scarring, Dry/Scaly, Maceration, Atrophie Blanche, Cyanosis, Ecchymosis, Hemosiderin [name], [name]. ([id number]) Staining, Mottled, Pallor, Rubor, Erythema. Periwound temperature was noted as No Abnormality. The periwound has tenderness on palpation. Wound #3 status is Open. Original cause of wound was Gradually Appeared. The wound is located on the Right,Anterior Lower Leg. The wound measures 0.8cm length x 0.7cm width x 0.1cm depth; 0.44cm^2 area and 0.044cm^3 volume. The wound is limited to skin breakdown. There is no tunneling or undermining noted. There is a medium amount of serosanguineous drainage noted. The wound margin is distinct with the outline attached to the wound base. There is medium (34-66%) pink, pale granulation within the wound bed. There is a medium (34-66%) amount of necrotic tissue within the  wound bed including Adherent Slough. The periwound skin appearance exhibited:  Localized Edema, Moist. The periwound skin appearance did not exhibit: Callus, Crepitus, Excoriation, Fluctuance, Friable, Induration, Rash, Scarring, Dry/Scaly, Maceration, Atrophie Blanche, Cyanosis, Ecchymosis, Hemosiderin Staining, Mottled, Pallor, Rubor, Erythema. Periwound temperature was noted as No Abnormality. The periwound has tenderness on palpation. Wound #4 status is Open. Original cause of wound was Gradually Appeared. The wound is located on the Right,Medial Lower Leg. The wound measures 1cm length x 2.5cm width x 0.1cm depth; 1.963cm^2 area and 0.196cm^3 volume. The wound is limited to skin breakdown. There is no tunneling or undermining noted. There is a medium amount of serosanguineous drainage noted. The wound margin is distinct with the outline attached to the wound base. There is small (1-33%) pink, pale granulation within the wound bed. There is a large (67-100%) amount of necrotic tissue within the wound bed including Adherent Slough. The periwound skin appearance exhibited: Localized Edema, Moist. The periwound skin appearance did not exhibit: Callus, Crepitus, Excoriation, Fluctuance, Friable, Induration, Rash, Scarring, Dry/Scaly, Maceration, Atrophie Blanche, Cyanosis, Ecchymosis, Hemosiderin Staining, Mottled, Pallor, Rubor, Erythema. Periwound temperature was noted as No Abnormality. The periwound has tenderness on palpation. Wound #5 status is Open. Original cause of wound was Gradually Appeared. The wound is located on the Right,Posterior Calcaneus. The wound measures 3.5cm length x 2.5cm width x 0.1cm depth; 6.872cm^2 area and 0.687cm^3 volume. The wound is limited to skin breakdown. There is no tunneling or undermining noted. There is a medium amount of serosanguineous drainage noted. The wound margin is distinct with the outline attached to the wound base. There is no granulation within the wound bed. There is a large (67- 100%) amount of necrotic tissue within  the wound bed including Eschar and Adherent Slough. The periwound skin appearance exhibited: Moist, Mottled. The periwound skin appearance did not exhibit: Callus, Crepitus, Excoriation, Fluctuance, Friable, Induration, Localized Edema, Rash, Scarring, Dry/Scaly, Maceration, Atrophie Blanche, Cyanosis, Ecchymosis, Hemosiderin Staining, Pallor, Rubor, Erythema. Periwound temperature was noted as No Abnormality. Assessment Active Problems ICD-10 E11.621 - Type 2 diabetes mellitus with foot ulcer L89.613 - Pressure ulcer of right heel, stage 3 L97.212 - Non-pressure chronic ulcer of right calf with fat layer exposed L97.211 - Non-pressure chronic ulcer of right calf limited to breakdown of skin [name], [name] ([id number]) [name] - Long term (current) use of anticoagulants Procedures Wound #4 Wound #4 is a Diabetic Wound/Ulcer of the Lower Extremity located on the Right,Medial Lower Leg . There was a Skin/Subcutaneous Tissue Debridement [id number] debridement with total area of 2.5 sq cm performed by [name], MD. with the following instrument(s): Forceps and Scissors to remove Viable and Non-Viable tissue/material including Exudate, Fibrin/Slough, Skin, and Subcutaneous after achieving pain control using Lidocaine 4% Topical Solution. A time out was conducted prior to the start of the procedure. A Minimum amount of bleeding was controlled with Pressure. The procedure was tolerated well with a pain level of 0 throughout and a pain level of 0 following the procedure. Post Debridement Measurements: 1cm length x 2.5cm width x 0.1cm depth; 0.196cm^3 volume. Post procedure Diagnosis Wound #4: Same as Pre-Procedure Wound #5 Wound #5 is a Diabetic Wound/Ulcer of the Lower Extremity located on the Right,Posterior Calcaneus . There was a Skin/Subcutaneous Tissue Debridement [id number] debridement with total area of 8.75 sq cm performed by [name], MD. with the following  instrument(s): Curette, Forceps, and Scissors to remove Viable and Non-Viable tissue/material including Exudate, Fibrin/Slough, and Subcutaneous after achieving pain control using Lidocaine 4% Topical  Solution. A time out was conducted prior to the start of the procedure. A Minimum amount of bleeding was controlled with Pressure. The procedure was tolerated well with a pain level of 0 throughout and a pain level of 0 following the procedure. Post Debridement Measurements: 3.5cm length x 2.5cm width x 0.1cm depth; 0.687cm^3 volume. Post procedure Diagnosis Wound #5: Same as Pre-Procedure Plan Wound Cleansing: Wound #2 Left,Anterior Lower Leg: Clean wound with Normal Saline. - in clinic Cleanse wound with mild soap and water Wound #3 Right,Anterior Lower Leg: Clean wound with Normal Saline. - in clinic Cleanse wound with mild soap and water Wound #4 Right,Medial Lower Leg: Clean wound with Normal Saline. - in clinic Cleanse wound with mild soap and water Wound #5 Right,Posterior Calcaneus: [name], [name] ([id number]) Clean wound with Normal Saline. - in clinic Cleanse wound with mild soap and water Anesthetic: Wound #2 Left,Anterior Lower Leg: Topical Lidocaine 4% cream applied to wound bed prior to debridement Wound #3 Right,Anterior Lower Leg: Topical Lidocaine 4% cream applied to wound bed prior to debridement Wound #4 Right,Medial Lower Leg: Topical Lidocaine 4% cream applied to wound bed prior to debridement Wound #5 Right,Posterior Calcaneus: Topical Lidocaine 4% cream applied to wound bed prior to debridement Primary Wound Dressing: Wound #2 Left,Anterior Lower Leg: Aquacel Ag Wound #3 Right,Anterior Lower Leg: Santyl Ointment Wound #4 Right,Medial Lower Leg: Santyl Ointment Wound #5 Right,Posterior Calcaneus: Santyl Ointment Secondary Dressing: Wound #3 Right,Anterior Lower Leg: Boardered Foam Dressing Wound #4 Right,Medial Lower Leg: Boardered Foam Dressing Wound  #5 Right,Posterior Calcaneus: Dry Gauze Foam - foam heel cup Dressing Change Frequency: Wound #2 Left,Anterior Lower Leg: Change dressing every day. Wound #3 Right,Anterior Lower Leg: Change dressing every day. Wound #4 Right,Medial Lower Leg: Change dressing every day. Wound #5 Right,Posterior Calcaneus: Change dressing every day. Follow-up Appointments: Return Appointment in 1 week. Off-Loading: Other: - float heels with pillow under calves when in bed; wear pressure relieving boots while in bed I have recommended: [name], [name]. ([id number]) 1. Santyl ointment to the wounds on the right heel and the right lower extremity to be covered with a bordered foam. 2. left lower extremity wound just to be covered with a bordered foam 3. Arterial duplex studies of both lower extremities -- appointment still pending 4. Good control of her diabetes 5. Adequate nutrition with protein supplements, vitamin A, vitamin C and zinc 6. Regular visits to the wound center The patient and her husband have read all questions answered and will be compliant Electronic Signature(s) Signed: 12/09/2015 3:19:33 PM By: [name], FACS Previous Signature: 12/09/2015 3:16:20 PM Version By: [name], FACS Entered By: [name] on 12/09/2015 15:19:32 [name] ([id number]) -------------------------------------------------------------------------------- SuperBill Details Patient Name: [name]. Date of Service: 12/09/2015 Medical Record Patient Account Number: [id number] Number: Afful, RN, BSN, Treating RN: Oct 24, 1935 (80 y.o. [name] Date of Birth/Sex: Female) Other Clinician: Primary Care Physician: Halina Maidens Treating [name] Referring Physician: Halina Maidens Physician/Extender: Suella Grove in Treatment: 1 Diagnosis Coding ICD-10 Codes Code Description E11.621 Type 2 diabetes mellitus with foot ulcer L89.613 Pressure ulcer of right heel, stage  3 L97.212 Non-pressure chronic ulcer of right calf with fat layer exposed L97.211 Non-pressure chronic ulcer of right calf limited to breakdown of skin [name] Long term (current) use of anticoagulants Facility Procedures CPT4 Code Description: [id number] - DEB SUBQ TISSUE 20 SQ CM/< ICD-10 Description Diagnosis E11.621 Type 2 diabetes mellitus with foot ulcer L89.613 Pressure ulcer of right heel,  stage 3 L97.211 Non-pressure chronic ulcer of right calf limited to  L97.212 Non-pressure chronic ulcer of right calf with fat l Modifier: breakdown of s ayer exposed Quantity: 1 kin Physician Procedures CPT4 Code Description: [id number] - WC PHYS LEVEL 3 - EST PT ICD-10 Description Diagnosis E11.621 Type 2 diabetes mellitus with foot ulcer L89.613 Pressure ulcer of right heel, stage 3 L97.212 Non-pressure chronic ulcer of right calf with fat l  L97.211 Non-pressure chronic ulcer of right calf limited to Modifier: 25 ayer exposed breakdown of sk Quantity: 1 in CPT4 Code Description: [id number] - WC PHYS SUBQ TISS 20 SQ CM ICD-10 Description Diagnosis E11.621 Type 2 diabetes mellitus with foot ulcer [name], [name] ([id number]) Modifier: Quantity: 1 Electronic Signature(s) Signed: 12/09/2015 3:19:53 PM By: [name], FACS Previous Signature: 12/09/2015 3:16:31 PM Version By: [name], FACS Entered By: [name] on 12/09/2015 15:19:53

## 2015-12-14 DIAGNOSIS — F0390 Unspecified dementia without behavioral disturbance: Secondary | ICD-10-CM | POA: Diagnosis not present

## 2015-12-14 DIAGNOSIS — L89610 Pressure ulcer of right heel, unstageable: Secondary | ICD-10-CM | POA: Diagnosis not present

## 2015-12-14 DIAGNOSIS — I5032 Chronic diastolic (congestive) heart failure: Secondary | ICD-10-CM | POA: Diagnosis not present

## 2015-12-14 DIAGNOSIS — L89620 Pressure ulcer of left heel, unstageable: Secondary | ICD-10-CM | POA: Diagnosis not present

## 2015-12-14 DIAGNOSIS — S72001D Fracture of unspecified part of neck of right femur, subsequent encounter for closed fracture with routine healing: Secondary | ICD-10-CM | POA: Diagnosis not present

## 2015-12-14 DIAGNOSIS — L89890 Pressure ulcer of other site, unstageable: Secondary | ICD-10-CM | POA: Diagnosis not present

## 2015-12-15 DIAGNOSIS — L89610 Pressure ulcer of right heel, unstageable: Secondary | ICD-10-CM | POA: Diagnosis not present

## 2015-12-15 DIAGNOSIS — F0390 Unspecified dementia without behavioral disturbance: Secondary | ICD-10-CM | POA: Diagnosis not present

## 2015-12-15 DIAGNOSIS — L89620 Pressure ulcer of left heel, unstageable: Secondary | ICD-10-CM | POA: Diagnosis not present

## 2015-12-15 DIAGNOSIS — S72001D Fracture of unspecified part of neck of right femur, subsequent encounter for closed fracture with routine healing: Secondary | ICD-10-CM | POA: Diagnosis not present

## 2015-12-15 DIAGNOSIS — L89890 Pressure ulcer of other site, unstageable: Secondary | ICD-10-CM | POA: Diagnosis not present

## 2015-12-15 DIAGNOSIS — I5032 Chronic diastolic (congestive) heart failure: Secondary | ICD-10-CM | POA: Diagnosis not present

## 2015-12-16 ENCOUNTER — Other Ambulatory Visit: Payer: Self-pay | Admitting: Surgery

## 2015-12-16 DIAGNOSIS — I5032 Chronic diastolic (congestive) heart failure: Secondary | ICD-10-CM | POA: Diagnosis not present

## 2015-12-16 DIAGNOSIS — S72001D Fracture of unspecified part of neck of right femur, subsequent encounter for closed fracture with routine healing: Secondary | ICD-10-CM | POA: Diagnosis not present

## 2015-12-16 DIAGNOSIS — L89610 Pressure ulcer of right heel, unstageable: Secondary | ICD-10-CM | POA: Diagnosis not present

## 2015-12-16 DIAGNOSIS — L97909 Non-pressure chronic ulcer of unspecified part of unspecified lower leg with unspecified severity: Secondary | ICD-10-CM

## 2015-12-16 DIAGNOSIS — F0390 Unspecified dementia without behavioral disturbance: Secondary | ICD-10-CM | POA: Diagnosis not present

## 2015-12-16 DIAGNOSIS — L89620 Pressure ulcer of left heel, unstageable: Secondary | ICD-10-CM | POA: Diagnosis not present

## 2015-12-16 DIAGNOSIS — L89890 Pressure ulcer of other site, unstageable: Secondary | ICD-10-CM | POA: Diagnosis not present

## 2015-12-17 ENCOUNTER — Ambulatory Visit: Payer: Medicare Other | Admitting: Surgery

## 2015-12-17 ENCOUNTER — Encounter: Payer: Medicare Other | Attending: Surgery | Admitting: Surgery

## 2015-12-17 DIAGNOSIS — N189 Chronic kidney disease, unspecified: Secondary | ICD-10-CM | POA: Diagnosis not present

## 2015-12-17 DIAGNOSIS — I509 Heart failure, unspecified: Secondary | ICD-10-CM | POA: Diagnosis not present

## 2015-12-17 DIAGNOSIS — E11621 Type 2 diabetes mellitus with foot ulcer: Secondary | ICD-10-CM | POA: Diagnosis not present

## 2015-12-17 DIAGNOSIS — E11622 Type 2 diabetes mellitus with other skin ulcer: Secondary | ICD-10-CM | POA: Diagnosis not present

## 2015-12-17 DIAGNOSIS — M329 Systemic lupus erythematosus, unspecified: Secondary | ICD-10-CM | POA: Insufficient documentation

## 2015-12-17 DIAGNOSIS — E1122 Type 2 diabetes mellitus with diabetic chronic kidney disease: Secondary | ICD-10-CM | POA: Diagnosis not present

## 2015-12-17 DIAGNOSIS — F0390 Unspecified dementia without behavioral disturbance: Secondary | ICD-10-CM | POA: Insufficient documentation

## 2015-12-17 DIAGNOSIS — L97212 Non-pressure chronic ulcer of right calf with fat layer exposed: Secondary | ICD-10-CM | POA: Diagnosis not present

## 2015-12-17 DIAGNOSIS — Z7901 Long term (current) use of anticoagulants: Secondary | ICD-10-CM | POA: Diagnosis not present

## 2015-12-17 DIAGNOSIS — Z87891 Personal history of nicotine dependence: Secondary | ICD-10-CM | POA: Diagnosis not present

## 2015-12-17 DIAGNOSIS — J449 Chronic obstructive pulmonary disease, unspecified: Secondary | ICD-10-CM | POA: Insufficient documentation

## 2015-12-17 DIAGNOSIS — L97411 Non-pressure chronic ulcer of right heel and midfoot limited to breakdown of skin: Secondary | ICD-10-CM | POA: Diagnosis not present

## 2015-12-17 DIAGNOSIS — L97811 Non-pressure chronic ulcer of other part of right lower leg limited to breakdown of skin: Secondary | ICD-10-CM | POA: Diagnosis not present

## 2015-12-17 DIAGNOSIS — L97821 Non-pressure chronic ulcer of other part of left lower leg limited to breakdown of skin: Secondary | ICD-10-CM | POA: Diagnosis not present

## 2015-12-17 DIAGNOSIS — R339 Retention of urine, unspecified: Secondary | ICD-10-CM | POA: Diagnosis not present

## 2015-12-17 DIAGNOSIS — I480 Paroxysmal atrial fibrillation: Secondary | ICD-10-CM | POA: Insufficient documentation

## 2015-12-17 DIAGNOSIS — L97211 Non-pressure chronic ulcer of right calf limited to breakdown of skin: Secondary | ICD-10-CM | POA: Insufficient documentation

## 2015-12-17 DIAGNOSIS — F419 Anxiety disorder, unspecified: Secondary | ICD-10-CM | POA: Insufficient documentation

## 2015-12-17 DIAGNOSIS — L89613 Pressure ulcer of right heel, stage 3: Secondary | ICD-10-CM | POA: Diagnosis not present

## 2015-12-18 NOTE — Progress Notes (Addendum)
VENTURA, OGBURN ([id number]) Visit Report for 12/17/2015 Chief Complaint Document Details Patient Name: [name], [name]. Date of Service: 12/17/2015 1:30 PM Medical Record Number: [id number] Patient Account Number: [id number] Date of Birth/Sex: Mar 12, 1936 (80 y.o. Female) Treating RN: Leane Call Primary Care Physician: Halina Maidens Other Clinician: Referring Physician: Halina Maidens Treating Physician/Extender: Frann Rider in Treatment: 2 Information Obtained from: Patient Chief Complaint Patient is at the clinic for treatment of an open pressure ulcer to her right calcaneum which has been there for about 4 weeks Electronic Signature(s) Signed: 12/17/2015 2:23:59 PM By: [name], FACS Entered By: [name] on 12/17/2015 14:23:59 [name] ([id number]) -------------------------------------------------------------------------------- Debridement Details Patient Name: [name]. Date of Service: 12/17/2015 1:30 PM Medical Record Number: [id number] Patient Account Number: [id number] Date of Birth/Sex: 1935-10-28 (80 y.o. Female) Treating RN: Leane Call Primary Care Physician: Halina Maidens Other Clinician: Referring Physician: Halina Maidens Treating Physician/Extender: Frann Rider in Treatment: 2 Debridement Performed for Wound #4 Right,Medial Lower Leg Assessment: Performed By: Physician [name], MD Debridement: Debridement Pre-procedure Yes Verification/Time Out Taken: Start Time: 14:32 Pain Control: Lidocaine 4% Topical Solution Level: Skin/Subcutaneous Tissue Total Area Debrided (L x 1.1 (cm) x 0.8 (cm) = 0.88 (cm) W): Tissue and other Non-Viable, Fibrin/Slough, Subcutaneous material debrided: Instrument: Curette Bleeding: Minimum Hemostasis Achieved: Pressure End Time: 14:33 Procedural Pain: 1 Post Procedural Pain: 1 Response to Treatment: Procedure was tolerated well Post Debridement Measurements of  Total Wound Length: (cm) 1.1 Width: (cm) 0.8 Depth: (cm) 0.2 Volume: (cm) 0.138 Post Procedure Diagnosis Same as Pre-procedure Electronic Signature(s) Signed: 12/17/2015 3:36:55 PM By: [name], FACS Signed: 12/17/2015 4:11:07 PM By: Leane Call Entered By: Leane Call on 12/17/2015 14:39:06 [name] ([id number]) -------------------------------------------------------------------------------- Debridement Details Patient Name: [name]. Date of Service: 12/17/2015 1:30 PM Medical Record Number: [id number] Patient Account Number: [id number] Date of Birth/Sex: 1935/12/24 (80 y.o. Female) Treating RN: Leane Call Primary Care Physician: Halina Maidens Other Clinician: Referring Physician: Halina Maidens Treating Physician/Extender: Frann Rider in Treatment: 2 Debridement Performed for Wound #5 Right,Posterior Calcaneus Assessment: Performed By: Physician [name], MD Debridement: Debridement Pre-procedure Yes Verification/Time Out Taken: Start Time: 14:33 Pain Control: Lidocaine 4% Topical Solution Level: Skin/Subcutaneous Tissue Total Area Debrided (L x 3.5 (cm) x 2.8 (cm) = 9.8 (cm) W): Tissue and other Non-Viable, Fibrin/Slough, Subcutaneous material debrided: Instrument: Curette Bleeding: Minimum Hemostasis Achieved: Pressure End Time: 14:35 Procedural Pain: 1 Post Procedural Pain: 1 Response to Treatment: Procedure was tolerated well Post Debridement Measurements of Total Wound Length: (cm) 3.5 Width: (cm) 2.8 Depth: (cm) 0.2 Volume: (cm) 1.539 Post Procedure Diagnosis Same as Pre-procedure Electronic Signature(s) Signed: 12/17/2015 3:36:55 PM By: [name], FACS Signed: 12/17/2015 4:11:07 PM By: Leane Call Entered By: Leane Call on 12/17/2015 14:39:49 [name] ([id number]) -------------------------------------------------------------------------------- HPI Details Patient Name:  [name] Date of Service: 12/17/2015 1:30 PM Medical Record Number: [id number] Patient Account Number: [id number] Date of Birth/Sex: Mar 07, 1936 (80 y.o. Female) Treating RN: Leane Call Primary Care Physician: Halina Maidens Other Clinician: Referring Physician: Halina Maidens Treating Physician/Extender: Frann Rider in Treatment: 2 History of Present Illness Location: right posterior heel, and both lower extremities Quality: Patient reports experiencing a sharp pain to affected area(s). Severity: Patient states wound (s) are getting better. Duration: Patient has had the wound for < 4 weeks prior to presenting for treatment Timing: Pain in wound is Intermittent (comes and goes Context: The wound appeared gradually over time while she was  in hospital with a right hip fracture Modifying Factors: Consults to this date include:was seen by her PCP and put on some antibiotics a while ago Associated Signs and Symptoms: Patient reports having difficulty standing for long periods. HPI Description: 80 year old patient referred who was recently discharged in April of this year with a left heel ulcer now comes with a right heel ulcer and some ulcerations in both lower extremities which have all come along during her recent hospitalization which she had a right hip fracture. She has a past medical history of COPD, diabetes mellitus type 2 with chronic kidney disease, multi-infarct dementia, anxiety, urinary retention, paroxysmal atrial fibrillation. She has also had moderate malnutrition, multi-infarct dementia, congestive heart failure, left displaced femoral neck fracture, systemic lupus erythematosus. She is also status total abdominal hysterectomy with bilateral salpingo-oophorectomy, EGDs, hip arthroplasty on the left, cataract surgery. She has been a from a smoker and quit in May 2013 and she smoked for about 40 years. recently admitted to [facility]  between January 19 and 07/03/2015 12/09/2015 -- o right foot x-ray -- IMPRESSION:Soft tissue ulceration of the heel. No underlying erosive bony abnormality. Diffuse degenerative change. No acute bony abnormality. Electronic Signature(s) Signed: 12/17/2015 2:24:05 PM By: [name], FACS Entered By: [name] on 12/17/2015 14:24:05 [name] ([id number]) -------------------------------------------------------------------------------- Physical Exam Details Patient Name: [name] Date of Service: 12/17/2015 1:30 PM Medical Record Number: [id number] Patient Account Number: [id number] Date of Birth/Sex: 1935-12-07 (80 y.o. Female) Treating RN: Leane Call Primary Care Physician: Halina Maidens Other Clinician: Referring Physician: Halina Maidens Treating Physician/Extender: Frann Rider in Treatment: 2 Constitutional . Pulse regular. Respirations normal and unlabored. Afebrile. . Eyes Nonicteric. Reactive to light. Ears, Nose, Mouth, and Throat Lips, teeth, and gums WNL.Marland Kitchen Moist mucosa without lesions. Neck supple and nontender. No palpable supraclavicular or cervical adenopathy. Normal sized without goiter. Respiratory WNL. No retractions.. Breath sounds WNL, No rubs, rales, rhonchi, or wheeze.. Cardiovascular Heart rhythm and rate regular, no murmur or gallop.. Pedal Pulses WNL. No clubbing, cyanosis or edema. Chest Breasts symmetical and no nipple discharge.. Breast tissue WNL, no masses, lumps, or tenderness.. Lymphatic No adneopathy. No adenopathy. No adenopathy. Musculoskeletal Adexa without tenderness or enlargement.. Digits and nails w/o clubbing, cyanosis, infection, petechiae, ischemia, or inflammatory conditions.. Integumentary (Hair, Skin) No suspicious lesions. No crepitus or fluctuance. No peri-wound warmth or erythema. No masses.Marland Kitchen Psychiatric Judgement and insight Intact.. No evidence of depression, anxiety, or  agitation.. Notes both the wounds had significant amount of subcutaneous debris and I have used a #3 curet and sharply debrided the wounds as much as she could tolerate. Minimal bleeding controlled with pressure. Electronic Signature(s) Signed: 12/17/2015 2:40:11 PM By: [name], FACS Entered By: [name] on 12/17/2015 14:40:10 [name] ([id number]) -------------------------------------------------------------------------------- Physician Orders Details Patient Name: [name] Date of Service: 12/17/2015 1:30 PM Medical Record Number: [id number] Patient Account Number: [id number] Date of Birth/Sex: 03/12/1936 (80 y.o. Female) Treating RN: Leane Call Primary Care Physician: Halina Maidens Other Clinician: Referring Physician: Halina Maidens Treating Physician/Extender: Frann Rider in Treatment: 2 Verbal / Phone Orders: No Diagnosis Coding ICD-10 Coding Code Description E11.621 Type 2 diabetes mellitus with foot ulcer L89.613 Pressure ulcer of right heel, stage 3 L97.212 Non-pressure chronic ulcer of right calf with fat layer exposed L97.211 Non-pressure chronic ulcer of right calf limited to breakdown of skin Z79.01 Long term (current) use of anticoagulants Wound Cleansing Wound #2 Left,Anterior Lower Leg o Clean wound with  Normal Saline. - in clinic o Cleanse wound with mild soap and water Wound #3 Right,Anterior Lower Leg o Clean wound with Normal Saline. - in clinic o Cleanse wound with mild soap and water Wound #4 Right,Medial Lower Leg o Clean wound with Normal Saline. - in clinic o Cleanse wound with mild soap and water Wound #5 Right,Posterior Calcaneus o Clean wound with Normal Saline. - in clinic o Cleanse wound with mild soap and water Anesthetic Wound #2 Left,Anterior Lower Leg o Topical Lidocaine 4% cream applied to wound bed prior to debridement Wound #3 Right,Anterior Lower Leg o Topical Lidocaine  4% cream applied to wound bed prior to debridement Wound #4 Right,Medial Lower Leg o Topical Lidocaine 4% cream applied to wound bed prior to debridement Wound #5 Right,Posterior Calcaneus [name] ([id number]) o Topical Lidocaine 4% cream applied to wound bed prior to debridement Primary Wound Dressing Wound #2 Left,Anterior Lower Leg o Aquacel Ag Wound #3 Right,Anterior Lower Leg o Santyl Ointment Wound #4 Right,Medial Lower Leg o Santyl Ointment Wound #5 Right,Posterior Calcaneus o Santyl Ointment Secondary Dressing Wound #3 Right,Anterior Lower Leg o Boardered Foam Dressing Wound #4 Right,Medial Lower Leg o Boardered Foam Dressing Wound #5 Right,Posterior Calcaneus o Dry Gauze o Foam - foam heel cup Dressing Change Frequency Wound #2 Left,Anterior Lower Leg o Change dressing every day. Wound #3 Right,Anterior Lower Leg o Change dressing every day. Wound #4 Right,Medial Lower Leg o Change dressing every day. Wound #5 Right,Posterior Calcaneus o Change dressing every day. Follow-up Appointments o Return Appointment in 1 week. Off-Loading o Other: - float heels with pillow under calves when in bed; wear pressure relieving boots while in bed Notes follow up with daughter to determine if vein and vascular has contacted her about appt. [name], [name] ([id number]) Electronic Signature(s) Signed: 12/17/2015 3:36:55 PM By: [name], FACS Signed: 12/17/2015 4:11:07 PM By: Leane Call Entered By: Leane Call on 12/17/2015 14:41:00 [name] ([id number]) -------------------------------------------------------------------------------- Problem List Details Patient Name: [name], [name]. Date of Service: 12/17/2015 1:30 PM Medical Record Number: [id number] Patient Account Number: [id number] Date of Birth/Sex: [birth year] (80 y.o. Female) Treating RN: Leane Call Primary Care Physician: Halina Maidens Other  Clinician: Referring Physician: Halina Maidens Treating Physician/Extender: Frann Rider in Treatment: 2 Active Problems ICD-10 Encounter Code Description Active Date Diagnosis E11.621 Type 2 diabetes mellitus with foot ulcer 12/02/2015 Yes L89.613 Pressure ulcer of right heel, stage 3 12/02/2015 Yes L97.212 Non-pressure chronic ulcer of right calf with fat layer 12/02/2015 Yes exposed L97.211 Non-pressure chronic ulcer of right calf limited to 12/02/2015 Yes breakdown of skin Z79.01 Long term (current) use of anticoagulants 12/02/2015 Yes Inactive Problems Resolved Problems Electronic Signature(s) Signed: 12/17/2015 2:23:51 PM By: [name], FACS Entered By: [name] on 12/17/2015 14:23:50 [name] ([id number]) -------------------------------------------------------------------------------- Progress Note Details Patient Name: [name] Date of Service: 12/17/2015 1:30 PM Medical Record Number: [id number] Patient Account Number: [id number] Date of Birth/Sex: 03/13/1936 (80 y.o. Female) Treating RN: Leane Call Primary Care Physician: Halina Maidens Other Clinician: Referring Physician: Halina Maidens Treating Physician/Extender: Frann Rider in Treatment: 2 Subjective Chief Complaint Information obtained from Patient Patient is at the clinic for treatment of an open pressure ulcer to her right calcaneum which has been there for about 4 weeks History of Present Illness (HPI) The following HPI elements were documented for the patient's wound: Location: right posterior heel, and both lower extremities Quality: Patient reports experiencing a sharp pain to affected area(s). Severity:  Patient states wound (s) are getting better. Duration: Patient has had the wound for < 4 weeks prior to presenting for treatment Timing: Pain in wound is Intermittent (comes and goes Context: The wound appeared gradually over time while she was in hospital  with a right hip fracture Modifying Factors: Consults to this date include:was seen by her PCP and put on some antibiotics a while ago Associated Signs and Symptoms: Patient reports having difficulty standing for long periods. 80 year old patient referred who was recently discharged in April of this year with a left heel ulcer now comes with a right heel ulcer and some ulcerations in both lower extremities which have all come along during her recent hospitalization which she had a right hip fracture. She has a past medical history of COPD, diabetes mellitus type 2 with chronic kidney disease, multi-infarct dementia, anxiety, urinary retention, paroxysmal atrial fibrillation. She has also had moderate malnutrition, multi-infarct dementia, congestive heart failure, left displaced femoral neck fracture, systemic lupus erythematosus. She is also status total abdominal hysterectomy with bilateral salpingo-oophorectomy, EGDs, hip arthroplasty on the left, cataract surgery. She has been a from a smoker and quit in May 2013 and she smoked for about 40 years. recently admitted to [facility] between January 19 and 07/03/2015 12/09/2015 -- right foot x-ray -- IMPRESSION:Soft tissue ulceration of the heel. No underlying erosive bony abnormality. Diffuse degenerative change. No acute bony abnormality. [address] [name], [name] ([id number]) Constitutional Pulse regular. Respirations normal and unlabored. Afebrile. Vitals Time Taken: 2:00 PM, Height: 64 in, Source: Stated, Weight: 110 lbs, Source: Stated, BMI: 18.9, Temperature: 98.2 F, Pulse: 64 bpm, Respiratory Rate: 16 breaths/min, Blood Pressure: 118/60 mmHg. Eyes Nonicteric. Reactive to light. Ears, Nose, Mouth, and Throat Lips, teeth, and gums WNL.Marland Kitchen Moist mucosa without lesions. Neck supple and nontender. No palpable supraclavicular or cervical adenopathy. Normal sized without goiter. Respiratory WNL. No retractions..  Breath sounds WNL, No rubs, rales, rhonchi, or wheeze.. Cardiovascular Heart rhythm and rate regular, no murmur or gallop.. Pedal Pulses WNL. No clubbing, cyanosis or edema. Chest Breasts symmetical and no nipple discharge.. Breast tissue WNL, no masses, lumps, or tenderness.. Lymphatic No adneopathy. No adenopathy. No adenopathy. Musculoskeletal Adexa without tenderness or enlargement.. Digits and nails w/o clubbing, cyanosis, infection, petechiae, ischemia, or inflammatory conditions.Marland Kitchen Psychiatric Judgement and insight Intact.. No evidence of depression, anxiety, or agitation.. General Notes: both the wounds had significant amount of subcutaneous debris and I have used a #3 curet and sharply debrided the wounds as much as she could tolerate. Minimal bleeding controlled with pressure. Integumentary (Hair, Skin) No suspicious lesions. No crepitus or fluctuance. No peri-wound warmth or erythema. No masses.. Wound #2 status is Open. Original cause of wound was Gradually Appeared. The wound is located on the Left,Anterior Lower Leg. The wound measures 0cm length x 0cm width x 0cm depth; 0cm^2 area and 0cm^3 volume. The wound is limited to skin breakdown. There is no tunneling or undermining noted. There is a none present amount of drainage noted. The wound margin is distinct with the outline attached to the wound base. There is no granulation within the wound bed. There is no necrotic tissue within the wound bed. The periwound skin appearance did not exhibit: Callus, Crepitus, Excoriation, Fluctuance, Friable, Induration, Localized Edema, Rash, Scarring, Dry/Scaly, Maceration, Moist, Atrophie Blanche, Cyanosis, Ecchymosis, Hemosiderin Staining, Mottled, Pallor, Rubor, Erythema. Periwound temperature was noted as [name], [name]. ([id number]) No Abnormality. The periwound has tenderness on palpation. Wound #3 status is Open. Original cause of  wound was Gradually Appeared. The wound is located  on the Right,Anterior Lower Leg. The wound measures 0.3cm length x 0.5cm width x 0.1cm depth; 0.118cm^2 area and 0.012cm^3 volume. The wound is limited to skin breakdown. There is no tunneling or undermining noted. There is a small amount of serosanguineous drainage noted. The wound margin is distinct with the outline attached to the wound base. There is medium (34-66%) pink, pale granulation within the wound bed. There is a medium (34-66%) amount of necrotic tissue within the wound bed including Adherent Slough. The periwound skin appearance exhibited: Localized Edema, Moist. The periwound skin appearance did not exhibit: Callus, Crepitus, Excoriation, Fluctuance, Friable, Induration, Rash, Scarring, Dry/Scaly, Maceration, Atrophie Blanche, Cyanosis, Ecchymosis, Hemosiderin Staining, Mottled, Pallor, Rubor, Erythema. Periwound temperature was noted as No Abnormality. The periwound has tenderness on palpation. Wound #4 status is Open. Original cause of wound was Gradually Appeared. The wound is located on the Right,Medial Lower Leg. The wound measures 1.1cm length x 1.8cm width x 0.2cm depth; 1.555cm^2 area and 0.311cm^3 volume. The wound is limited to skin breakdown. There is no tunneling or undermining noted. There is a medium amount of serosanguineous drainage noted. The wound margin is distinct with the outline attached to the wound base. There is small (1-33%) pink, pale granulation within the wound bed. There is a large (67-100%) amount of necrotic tissue within the wound bed including Adherent Slough. The periwound skin appearance exhibited: Localized Edema, Moist. The periwound skin appearance did not exhibit: Callus, Crepitus, Excoriation, Fluctuance, Friable, Induration, Rash, Scarring, Dry/Scaly, Maceration, Atrophie Blanche, Cyanosis, Ecchymosis, Hemosiderin Staining, Mottled, Pallor, Rubor, Erythema. Periwound temperature was noted as No Abnormality. The periwound has tenderness  on palpation. Wound #5 status is Open. Original cause of wound was Gradually Appeared. The wound is located on the Right,Posterior Calcaneus. The wound measures 3.5cm length x 2.8cm width x 0.2cm depth; 7.697cm^2 area and 1.539cm^3 volume. The wound is limited to skin breakdown. There is no tunneling or undermining noted. There is a large amount of serosanguineous drainage noted. The wound margin is distinct with the outline attached to the wound base. There is no granulation within the wound bed. There is a large (67- 100%) amount of necrotic tissue within the wound bed including Adherent Slough. The periwound skin appearance exhibited: Localized Edema, Maceration, Moist, Mottled. The periwound skin appearance did not exhibit: Callus, Crepitus, Excoriation, Fluctuance, Friable, Induration, Rash, Scarring, Dry/Scaly, Atrophie Blanche, Cyanosis, Ecchymosis, Hemosiderin Staining, Pallor, Rubor, Erythema. Periwound temperature was noted as No Abnormality. Assessment Active Problems ICD-10 E11.621 - Type 2 diabetes mellitus with foot ulcer L89.613 - Pressure ulcer of right heel, stage 3 L97.212 - Non-pressure chronic ulcer of right calf with fat layer exposed L97.211 - Non-pressure chronic ulcer of right calf limited to breakdown of skin Z79.01 - Long term (current) use of anticoagulants [name], [name] ([id number]) Procedures Wound #4 Wound #4 is a Diabetic Wound/Ulcer of the Lower Extremity located on the Right,Medial Lower Leg . There was a Skin/Subcutaneous Tissue Debridement [id number] debridement with total area of 0.88 sq cm performed by [name], MD. with the following instrument(s): Curette to remove Non-Viable tissue/material including Fibrin/Slough and Subcutaneous after achieving pain control using Lidocaine 4% Topical Solution. A time out was conducted prior to the start of the procedure. A Minimum amount of bleeding was controlled with Pressure. The procedure was  tolerated well with a pain level of 1 throughout and a pain level of 1 following the procedure. Post Debridement Measurements: 1.1cm length x 0.8cm  width x 0.2cm depth; 0.138cm^3 volume. Post procedure Diagnosis Wound #4: Same as Pre-Procedure Wound #5 Wound #5 is a Diabetic Wound/Ulcer of the Lower Extremity located on the Right,Posterior Calcaneus . There was a Skin/Subcutaneous Tissue Debridement [id number] debridement with total area of 9.8 sq cm performed by [name], MD. with the following instrument(s): Curette to remove Non-Viable tissue/material including Fibrin/Slough and Subcutaneous after achieving pain control using Lidocaine 4% Topical Solution. A time out was conducted prior to the start of the procedure. A Minimum amount of bleeding was controlled with Pressure. The procedure was tolerated well with a pain level of 1 throughout and a pain level of 1 following the procedure. Post Debridement Measurements: 3.5cm length x 2.8cm width x 0.2cm depth; 1.539cm^3 volume. Post procedure Diagnosis Wound #5: Same as Pre-Procedure Plan Wound Cleansing: Wound #2 Left,Anterior Lower Leg: Clean wound with Normal Saline. - in clinic Cleanse wound with mild soap and water Wound #3 Right,Anterior Lower Leg: Clean wound with Normal Saline. - in clinic Cleanse wound with mild soap and water Wound #4 Right,Medial Lower Leg: Clean wound with Normal Saline. - in clinic Cleanse wound with mild soap and water Wound #5 Right,Posterior Calcaneus: Clean wound with Normal Saline. - in clinic [name], [name] ([id number]) Cleanse wound with mild soap and water Anesthetic: Wound #2 Left,Anterior Lower Leg: Topical Lidocaine 4% cream applied to wound bed prior to debridement Wound #3 Right,Anterior Lower Leg: Topical Lidocaine 4% cream applied to wound bed prior to debridement Wound #4 Right,Medial Lower Leg: Topical Lidocaine 4% cream applied to wound bed prior to debridement Wound #5  Right,Posterior Calcaneus: Topical Lidocaine 4% cream applied to wound bed prior to debridement Primary Wound Dressing: Wound #2 Left,Anterior Lower Leg: Aquacel Ag Wound #3 Right,Anterior Lower Leg: Santyl Ointment Wound #4 Right,Medial Lower Leg: Santyl Ointment Wound #5 Right,Posterior Calcaneus: Santyl Ointment Secondary Dressing: Wound #3 Right,Anterior Lower Leg: Boardered Foam Dressing Wound #4 Right,Medial Lower Leg: Boardered Foam Dressing Wound #5 Right,Posterior Calcaneus: Dry Gauze Foam - foam heel cup Dressing Change Frequency: Wound #2 Left,Anterior Lower Leg: Change dressing every day. Wound #3 Right,Anterior Lower Leg: Change dressing every day. Wound #4 Right,Medial Lower Leg: Change dressing every day. Wound #5 Right,Posterior Calcaneus: Change dressing every day. Follow-up Appointments: Return Appointment in 1 week. Off-Loading: Other: - float heels with pillow under calves when in bed; wear pressure relieving boots while in bed General Notes: follow up with daughter to determine if vein and vascular has contacted her about appt. I have recommended: [name], [name] ([id number]) 1. Santyl ointment to the wounds on the right heel and the right lower extremity to be covered with a bordered foam. 2. Arterial duplex studies of both lower extremities -- appointment still pending 3. Good control of her diabetes 4. Adequate nutrition with protein supplements, vitamin A, vitamin C and zinc 5. Regular visits to the wound center The patient and her husband have read all questions answered and will be compliant Electronic Signature(s) Signed: 12/17/2015 3:35:12 PM By: [name], FACS Previous Signature: 12/17/2015 2:40:54 PM Version By: [name], FACS Entered By: [name] on 12/17/2015 15:35:12 [name] ([id number]) -------------------------------------------------------------------------------- SuperBill Details Patient Name: [name]. Date of Service: 12/17/2015 Medical Record Number: [id number] Patient Account Number: [id number] Date of Birth/Sex: [birth year] (80 y.o. 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 L97.211 Non-pressure chronic ulcer of right calf limited to breakdown of skin Z79.01 Long term (current) use of anticoagulants Facility Procedures CPT4 Code Description: [id number] - DEB SUBQ TISSUE 20 SQ CM/< ICD-10 Description Diagnosis E11.621 Type 2 diabetes mellitus with foot ulcer L89.613 Pressure ulcer of right heel, stage 3 L97.212 Non-pressure chronic ulcer of right calf with fat l  L97.211 Non-pressure chronic ulcer of right calf limited to Modifier: ayer exposed breakdown of Quantity: 1 skin Physician Procedures CPT4 Code Description: [id number] - WC PHYS SUBQ TISS 20 SQ CM ICD-10 Description Diagnosis E11.621 Type 2 diabetes mellitus with foot ulcer L89.613 Pressure ulcer of right heel, stage 3 L97.212 Non-pressure chronic ulcer of right calf with fat l  L97.211 Non-pressure chronic ulcer of right calf limited to Modifier: ayer exposed breakdown of Quantity: 1 skin Electronic Signature(s) Signed: 12/17/2015 3:36:55 PM By: [name], FACS Signed: 12/17/2015 4:11:07 PM By: Leane Call Previous Signature: 12/17/2015 2:41:12 PM Version By: [name], FACS Entered By: Leane Call on 12/17/2015 14:43:02

## 2015-12-18 NOTE — Progress Notes (Signed)
LAQUATA, NGO ([id number]) Visit Report for 12/17/2015 Arrival Information Details Patient Name: [name], [name]. Date of Service: 12/17/2015 1:30 PM Medical Record Number: [id number] Patient Account Number: [id number] Date of Birth/Sex: Apr 14, 1936 (80 y.o. Female) Treating RN: Leane Call Primary Care Physician: Halina Maidens Other Clinician: Referring Physician: Halina Maidens Treating Physician/Extender: Frann Rider in Treatment: 2 Visit Information History Since Last Visit All ordered tests and consults were completed: No Patient Arrived: Wheel Chair Added or deleted any medications: No Arrival Time: 14:06 Any new allergies or adverse reactions: No Accompanied By: sitter and spouse Had a fall or experienced change in No Transfer Assistance: EasyPivot Patient activities of daily living that may affect Lift risk of falls: Patient Identification Verified: Yes Signs or symptoms of abuse/neglect since last No Secondary Verification Process Yes visito Completed: Hospitalized since last visit: No Patient Requires Transmission- No Has Dressing in Place as Prescribed: Yes Based Precautions: Pain Present Now: No Patient Has Alerts: Yes Patient Alerts: Patient on Blood Thinner Electronic Signature(s) Signed: 12/17/2015 4:11:07 PM By: Leane Call Entered By: Leane Call on 12/17/2015 14:09:07 [name] ([id number]) -------------------------------------------------------------------------------- Clinic Level of Care Assessment Details Patient Name: [name] Date of Service: 12/17/2015 1:30 PM Medical Record Number: [id number] Patient Account Number: [id number] Date of Birth/Sex: 1935-06-21 (80 y.o. Female) Treating RN: Leane Call Primary Care Physician: Halina Maidens Other Clinician: Referring Physician: Halina Maidens Treating Physician/Extender: Frann Rider in Treatment: 2 Clinic Level of Care Assessment Items TOOL 3  Quantity Score []  - Use when EandM and Procedure is performed on FOLLOW-UP visit 0 ASSESSMENTS - Nursing Assessment / Reassessment []  - Reassessment of Co-morbidities (includes updates in patient status) 0 X - Reassessment of Adherence to Treatment Plan 1 5 ASSESSMENTS - Wound and Skin Assessment / Reassessment []  - Points for Wound Assessment can only be taken for a new wound of unknown 0 or different etiology and a procedure is NOT performed to that wound []  - Simple Wound Assessment / Reassessment - one wound 0 X - Complex Wound Assessment / Reassessment - multiple wounds 1 5 []  - Dermatologic / Skin Assessment (not related to wound area) 0 ASSESSMENTS - Focused Assessment []  - Circumferential Edema Measurements - multi extremities 0 []  - Nutritional Assessment / Counseling / Intervention 0 []  - Lower Extremity Assessment (monofilament, tuning fork, pulses) 0 []  - Peripheral Arterial Disease Assessment (using hand held doppler) 0 ASSESSMENTS - Ostomy and/or Continence Assessment and Care []  - Incontinence Assessment and Management 0 []  - Ostomy Care Assessment and Management (repouching, etc.) 0 PROCESS - Coordination of Care []  - Points for Discharge Coordination can only be taken for a new wound of 0 unknown or different etiology and a procedure is NOT performed to that wound []  - Simple Patient / Family Education for ongoing care 0 X - Complex (extensive) Patient / Family Education for ongoing care 1 20 GYDA, BRICKER ([id number]) []  - Staff obtains Consents, Records, Test Results / Process Orders 0 []  - Staff telephones HHA, Nursing Homes / Clarify orders / etc 0 []  - Routine Transfer to another Facility (non-emergent condition) 0 []  - Routine Hospital Admission (non-emergent condition) 0 []  - New Admissions / Biomedical engineer / Ordering NPWT, Apligraf, etc. 0 []  - Emergency Hospital Admission (emergent condition) 0 X - Simple Discharge Coordination 1 10 []  -  Complex (extensive) Discharge Coordination 0 PROCESS - Special Needs []  - Pediatric / Minor Patient Management 0 []  - Isolation Patient Management 0 []  -  Hearing / Language / Visual special needs 0 []  - Assessment of Community assistance (transportation, D/C planning, etc.) 0 []  - Additional assistance / Altered mentation 0 []  - Support Surface(s) Assessment (bed, cushion, seat, etc.) 0 INTERVENTIONS - Wound Cleansing / Measurement []  - Points for Wound Cleaning / Measurement, Wound Dressing, Specimen 0 Collection and Specimen taken to lab can only be taken for a new wound of unknown or different etiology and a procedure is NOT performed to that wound []  - Simple Wound Cleansing - one wound 0 X - Complex Wound Cleansing - multiple wounds 4 5 []  - Wound Imaging (photographs - any number of wounds) 0 []  - Wound Tracing (instead of photographs) 0 []  - Simple Wound Measurement - one wound 0 X - Complex Wound Measurement - multiple wounds 4 5 INTERVENTIONS - Wound Dressings []  - Small Wound Dressing one or multiple wounds 0 ANY, IZBICKI. ([id number]) X - Medium Wound Dressing one or multiple wounds 4 15 []  - Large Wound Dressing one or multiple wounds 0 INTERVENTIONS - Miscellaneous []  - External ear exam 0 []  - Specimen Collection (cultures, biopsies, blood, body fluids, etc.) 0 []  - Specimen(s) / Culture(s) sent or taken to Lab for analysis 0 []  - Patient Transfer (multiple staff / Harrel Lemon Lift / Similar devices) 0 []  - Simple Staple / Suture removal (25 or less) 0 []  - Complex Staple / Suture removal (26 or more) 0 []  - Hypo / Hyperglycemic Management (close monitor of Blood Glucose) 0 []  - Ankle / Brachial Index (ABI) - do not check if billed separately 0 []  - Vital Signs 0 Has the patient been seen at the hospital within the last three years: Yes Total Score: 140 Level Of Care: New/Established - Level 4 Electronic Signature(s) Signed: 12/17/2015 4:11:07 PM By: Leane Call Entered By: Leane Call on 12/17/2015 14:42:20 [name] ([id number]) -------------------------------------------------------------------------------- Encounter Discharge Information Details Patient Name: [name] Date of Service: 12/17/2015 1:30 PM Medical Record Number: [id number] Patient Account Number: [id number] Date of Birth/Sex: July 28, 1935 (80 y.o. Female) Treating RN: Leane Call Primary Care Physician: Halina Maidens Other Clinician: Referring Physician: Halina Maidens Treating Physician/Extender: Frann Rider in Treatment: 2 Encounter Discharge Information Items Discharge Pain Level: 3 Discharge Condition: Stable Ambulatory Status: Wheelchair Discharge Destination: Home Transportation: Private Auto sitter and Accompanied By: spouse Schedule Follow-up Appointment: No Medication Reconciliation completed No and provided to Patient/Care Synthia Fairbank: Provided on Clinical Summary of Care: 12/17/2015 Form Type Recipient Paper Patient LK Electronic Signature(s) Signed: 12/17/2015 2:58:31 PM By: Ruthine Dose Entered By: Ruthine Dose on 12/17/2015 14:58:30 [name] ([id number]) -------------------------------------------------------------------------------- Lower Extremity Assessment Details Patient Name: [name] Date of Service: 12/17/2015 1:30 PM Medical Record Number: [id number] Patient Account Number: [id number] Date of Birth/Sex: 01-24-1936 (80 y.o. Female) Treating RN: Leane Call Primary Care Physician: Halina Maidens Other Clinician: Referring Physician: Halina Maidens Treating Physician/Extender: Frann Rider in Treatment: 2 Edema Assessment Assessed: Shirlyn Goltz: No] [Right: No] Edema: [Left: No] [Right: Yes] Vascular Assessment Claudication: Claudication Assessment [Left:None] [Right:None] Pulses: Posterior Tibial Dorsalis Pedis Palpable: [Left:Yes] [Right:Yes] Extremity colors, hair  growth, and conditions: Extremity Color: [Left:Normal] [Right:Normal] Hair Growth on Extremity: [Left:Yes] [Right:Yes] Temperature of Extremity: [Left:Cool] [Right:Warm] Capillary Refill: [Left:< 3 seconds] [Right:< 3 seconds] Dependent Rubor: [Left:Yes] [Right:Yes] Blanched when Elevated: [Left:No] [Right:No] Lipodermatosclerosis: [Left:No] [Right:No] Toe Nail Assessment Left: Right: Thick: No No Discolored: No No Deformed: No No Improper Length and Hygiene: No No Electronic Signature(s) Signed: 12/17/2015 4:11:07 PM By: Erskin Burnet,  Anderson Malta Entered By: Leane Call on 12/17/2015 14:18:17 [name] ([id number]) -------------------------------------------------------------------------------- Multi Wound Chart Details Patient Name: [name] Date of Service: 12/17/2015 1:30 PM Medical Record Number: [id number] Patient Account Number: [id number] Date of Birth/Sex: 1935-11-08 (80 y.o. Female) Treating RN: Leane Call Primary Care Physician: Halina Maidens Other Clinician: Referring Physician: Halina Maidens Treating Physician/Extender: Frann Rider in Treatment: 2 Vital Signs Height(in): 64 Pulse(bpm): 64 Weight(lbs): 110 Blood Pressure 118/60 (mmHg): Body Mass Index(BMI): 19 Temperature(F): 98.2 Respiratory Rate 16 (breaths/min): Photos: [2:No Photos] [3:No Photos] [4:No Photos] Wound Location: [2:Left Lower Leg - Anterior] [3:Right Lower Leg - Anterior Right Lower Leg - Medial] Wounding Event: [2:Gradually Appeared] [3:Gradually Appeared] [4:Gradually Appeared] Primary Etiology: [2:Diabetic Wound/Ulcer of the Lower Extremity] [3:Diabetic Wound/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] [4:the Lower Extremity] Comorbid History: [2:Chronic Obstructive Pulmonary Disease (COPD), Angina, Congestive Heart Failure, Type II Diabetes, Lupus Erythematosus, Osteoarthritis, Dementia] [3:Chronic Obstructive Pulmonary Disease (COPD), Angina,  Congestive Heart Failure,  Congestive Heart Failure, Type II Diabetes, Lupus Type II Diabetes, Lupus Erythematosus, Osteoarthritis, Dementia Osteoarthritis, Dementia] [4:Chronic Obstructive Pulmonary Disease (COPD), Angina, Erythematosus,] Date Acquired: [2:10/27/2015] [3:11/03/2015] [4:11/03/2015] Weeks of Treatment: [2:2] [3:2] [4:2] Wound Status: [2:Open] [3:Open] [4:Open] Measurements L x W x D 0x0x0 [3:0.3x0.5x0.1] [4:1.1x1.8x0.2] (cm) Area (cm) : [2:0] [3:0.118] [4:1.555] Volume (cm) : [2:0] [3:0.012] [4:0.311] % Reduction in Area: [2:100.00%] [3:72.20%] [4:36.50%] % Reduction in Volume: 100.00% [3:71.40%] [4:-26.90%] Classification: [2:Grade 1] [3:Grade 1] [4:Grade 1] Exudate Amount: [2:None Present] [3:Small] [4:Medium] Exudate Type: [2:N/A] [3:Serosanguineous] [4:Serosanguineous] Exudate Color: [2:N/A] [3:red, brown] [4:red, brown] Foul Odor After [2:No] [3:No] [4:No] Cleansing: Odor Anticipated Due to N/A [3:N/A] [4:N/A] Product Use: Wound Margin: [2:Distinct, outline attached] [3:Distinct, outline attached Distinct, outline attached] Granulation Amount: None Present (0%) Medium (34-66%) Small (1-33%) Granulation Quality: N/A Pink, Pale Pink, Pale Necrotic Amount: None Present (0%) Medium (34-66%) Large (67-100%) Exposed Structures: Fascia: No Fascia: No Fascia: No Fat: No Fat: No Fat: No Tendon: No Tendon: No Tendon: No Muscle: No Muscle: No Muscle: No Joint: No Joint: No Joint: No Bone: No Bone: No Bone: No Limited to Skin Limited to Skin Limited to Skin Breakdown Breakdown Breakdown Epithelialization: Large (67-100%) None None Periwound Skin Texture: Edema: No Edema: Yes Edema: Yes Excoriation: No Excoriation: No Excoriation: No Induration: No Induration: No Induration: No Callus: No Callus: No Callus: No Crepitus: No Crepitus: No Crepitus: No Fluctuance: No Fluctuance: No Fluctuance: No Friable: No Friable: No Friable: No Rash: No Rash:  No Rash: No Scarring: No Scarring: No Scarring: No Periwound Skin Maceration: No Moist: Yes Moist: Yes Moisture: Moist: No Maceration: No Maceration: No Dry/Scaly: No Dry/Scaly: No Dry/Scaly: No Periwound Skin Color: Atrophie Blanche: No Atrophie Blanche: No Atrophie Blanche: No Cyanosis: No Cyanosis: No Cyanosis: No Ecchymosis: No Ecchymosis: No Ecchymosis: No Erythema: No Erythema: No Erythema: No Hemosiderin Staining: No Hemosiderin Staining: No Hemosiderin Staining: No Mottled: No Mottled: No Mottled: No Pallor: No Pallor: No Pallor: No Rubor: No Rubor: No Rubor: No Temperature: No Abnormality No Abnormality No Abnormality Tenderness on Yes Yes Yes Palpation: Wound Preparation: Ulcer Cleansing: Ulcer Cleansing: Ulcer Cleansing: Rinsed/Irrigated with Rinsed/Irrigated with Rinsed/Irrigated with Saline Saline Saline Topical Anesthetic Topical Anesthetic Topical Anesthetic Applied: None Applied: Other: lidocaine Applied: Other: lidocaine 4% 4% Wound Number: 5 N/A N/A Photos: No Photos N/A N/A Wound Location: Right Calcaneus - N/A N/A Posterior Wounding Event: Gradually Appeared N/A N/A Primary Etiology: Diabetic Wound/Ulcer of N/A N/A the Lower Extremity COURTANY, HAIDER ([id number]) Comorbid History: Chronic Obstructive  N/A N/A Pulmonary Disease (COPD), Angina, Congestive Heart Failure, Type II Diabetes, Lupus Erythematosus, Osteoarthritis, Dementia Date Acquired: 11/03/2015 N/A N/A Weeks of Treatment: 2 N/A N/A Wound Status: Open N/A N/A Measurements L x W x D 3.5x2.8x0.2 N/A N/A (cm) Area (cm) : 7.697 N/A N/A Volume (cm) : 1.539 N/A N/A % Reduction in Area: 19.40% N/A N/A % Reduction in Volume: -61.20% N/A N/A Classification: Grade 1 N/A N/A Exudate Amount: Large N/A N/A Exudate Type: Serosanguineous N/A N/A Exudate Color: red, brown N/A N/A Foul Odor After Yes N/A N/A Cleansing: Odor Anticipated Due to No N/A N/A Product  Use: Wound Margin: Distinct, outline attached N/A N/A Granulation Amount: None Present (0%) N/A N/A Granulation Quality: N/A N/A N/A Necrotic Amount: Large (67-100%) N/A N/A Exposed Structures: Fascia: No N/A N/A Fat: No Tendon: No Muscle: No Joint: No Bone: No Limited to Skin Breakdown Epithelialization: Small (1-33%) N/A N/A Periwound Skin Texture: Edema: Yes N/A N/A Excoriation: No Induration: No Callus: No Crepitus: No Fluctuance: No Friable: No Rash: No Scarring: No Periwound Skin Maceration: Yes N/A N/A Moisture: Moist: Yes Dry/Scaly: No Periwound Skin Color: N/A N/A Adline PotterKEATON, Verleen Y. ([id number]) Mottled: Yes Atrophie Blanche: No Cyanosis: No Ecchymosis: No Erythema: No Hemosiderin Staining: No Pallor: No Rubor: No Temperature: No Abnormality N/A N/A Tenderness on No N/A N/A Palpation: Wound Preparation: Ulcer Cleansing: N/A N/A Rinsed/Irrigated with Saline Topical Anesthetic Applied: Other: lidocaine 4% Treatment Notes Electronic Signature(s) Signed: 12/17/2015 4:11:07 PM By: Rolan LipaPriddy, Jennifer Entered By: Rolan LipaPriddy, Jennifer on 12/17/2015 14:33:15 Adline PotterKEATON, Cyerra Y. ([id number]) -------------------------------------------------------------------------------- Multi-Disciplinary Care Plan Details Patient Name: Adline PotterKEATON, Monserath Y. Date of Service: 12/17/2015 1:30 PM Medical Record Number: [id number] Patient Account Number: [id number] Date of Birth/Sex: [birth year] (80 y.o. Female) Treating RN: Rolan LipaPriddy, Jennifer Primary Care Physician: Bari EdwardBerglund, Laura Other Clinician: Referring Physician: Bari EdwardBerglund, Laura Treating Physician/Extender: Rudene ReBritto, Errol Weeks in Treatment: 2 Active Inactive Abuse / Safety / Falls / Self Care Management Nursing Diagnoses: Potential for falls Goals: Patient/caregiver will verbalize understanding of skin care regimen Date Initiated: 12/02/2015 Goal Status: Active Patient/caregiver will verbalize/demonstrate measures taken to prevent  injury and/or falls Date Initiated: 12/02/2015 Goal Status: Active Interventions: Assess fall risk on admission and as needed Assess: immobility, friction, shearing, incontinence upon admission and as needed Assess impairment of mobility on admission and as needed per policy Assess self care needs on admission and as needed Provide education on fall prevention Provide education on safe transfers Notes: Wound/Skin Impairment Nursing Diagnoses: Impaired tissue integrity Goals: Patient/caregiver will verbalize understanding of skin care regimen Date Initiated: 12/02/2015 Goal Status: Active Interventions: Assess patient/caregiver ability to obtain necessary supplies Assess patient/caregiver ability to perform ulcer/skin care regimen upon admission and as needed Adline PotterKEATON, Devin Y. ([id number]) Assess ulceration(s) every visit Provide education on ulcer and skin care Notes: Electronic Signature(s) Signed: 12/17/2015 4:11:07 PM By: Rolan LipaPriddy, Jennifer Entered By: Rolan LipaPriddy, Jennifer on 12/17/2015 14:33:05 Adline PotterKEATON, Nikky Y. ([id number]) -------------------------------------------------------------------------------- Pain Assessment Details Patient Name: Adline PotterKEATON, Arthelia Y. Date of Service: 12/17/2015 1:30 PM Medical Record Number: [id number] Patient Account Number: [id number] Date of Birth/Sex: [birth year] (80 y.o. Female) Treating RN: Rolan LipaPriddy, Jennifer Primary Care Physician: Bari EdwardBerglund, Laura Other Clinician: Referring Physician: Bari EdwardBerglund, Laura Treating Physician/Extender: Rudene ReBritto, Errol Weeks in Treatment: 2 Active Problems Location of Pain Severity and Description of Pain Patient Has Paino Yes Site Locations With Dressing Change: Yes Duration of the Pain. Constant / Intermittento Intermittent How Long Does it Lasto Hours: Minutes: 30 Rate the pain. Current Pain Level: 0 Worst Pain Level: 8 Least Pain Level: 0  Tolerable Pain Level: 5 Character of Pain Describe the Pain: Aching,  Burning, Sharp Pain Management and Medication Current Pain Management: Medication: No Cold Application: No Rest: No Massage: No Activity: No T.E.N.S.: No Heat Application: No Leg drop or elevation: No Is the Current Pain Management Inadequate Adequate: How does your pain impact your activities of daily livingo Sleep: No Bathing: No Appetite: No Relationship With Others: No Bladder Continence: No Emotions: No Bowel Continence: No Work: No Toileting: No Drive: No Dressing: No Hobbies: No Notes LAURINA, GUIA ([id number]) pt states she thought about taking pain meds but decided against it Electronic Signature(s) Signed: 12/17/2015 4:11:07 PM By: Leane Call Entered By: Leane Call on 12/17/2015 14:10:06 [name] ([id number]) -------------------------------------------------------------------------------- Patient/Caregiver Education Details Patient Name: [name] Date of Service: 12/17/2015 1:30 PM Medical Record Number: [id number] Patient Account Number: [id number] Date of Birth/Gender: 11-29-1935 (80 y.o. Female) Treating RN: Leane Call Primary Care Physician: Halina Maidens Other Clinician: Referring Physician: Halina Maidens Treating Physician/Extender: Frann Rider in Treatment: 2 Education Assessment Education Provided To: Patient and Caregiver Education Topics Provided Wound/Skin Impairment: Methods: Explain/Verbal Responses: State content correctly Electronic Signature(s) Signed: 12/17/2015 4:11:07 PM By: Leane Call Entered By: Leane Call on 12/17/2015 14:44:38 [name] ([id number]) -------------------------------------------------------------------------------- Wound Assessment Details Patient Name: [name] Date of Service: 12/17/2015 1:30 PM Medical Record Number: [id number] Patient Account Number: [id number] Date of Birth/Sex: Mar 01, 1936 (80 y.o. Female) Treating RN: Leane Call Primary Care Physician: Halina Maidens Other Clinician: Referring Physician: Halina Maidens Treating Physician/Extender: Frann Rider in Treatment: 2 Wound Status Wound Number: 2 Primary Diabetic Wound/Ulcer of the Lower Etiology: Extremity Wound Location: Left Lower Leg - Anterior Wound Open Wounding Event: Gradually Appeared Status: Date Acquired: 10/27/2015 Comorbid Chronic Obstructive Pulmonary Disease Weeks Of Treatment: 2 History: (COPD), Angina, Congestive Heart Clustered Wound: No Failure, Type II Diabetes, Lupus Erythematosus, Osteoarthritis, Dementia Photos Photo Uploaded By: Gretta Cool, RN, BSN, Kim on 12/17/2015 16:51:14 Wound Measurements Length: (cm) 0 % Reductio Width: (cm) 0 % Reductio Depth: (cm) 0 Epithelial Area: (cm) 0 Tunneling Volume: (cm) 0 Undermini n in Area: 100% n in Volume: 100% ization: Large (67-100%) : No ng: No Wound Description Classification: Grade 1 Wound Margin: Distinct, outline attached Exudate Amount: None Present Foul Odor After Cleansing: No Wound Bed Granulation Amount: None Present (0%) Exposed Structure Necrotic Amount: None Present (0%) Fascia Exposed: No Fat Layer Exposed: No Tendon Exposed: No Muscle Exposed: No MONCHEL, ATALLA ([id number]) Joint Exposed: No Bone Exposed: No Limited to Skin Breakdown Periwound Skin Texture Texture Color No Abnormalities Noted: No No Abnormalities Noted: No Callus: No Atrophie Blanche: No Crepitus: No Cyanosis: No Excoriation: No Ecchymosis: No Fluctuance: No Erythema: No Friable: No Hemosiderin Staining: No Induration: No Mottled: No Localized Edema: No Pallor: No Rash: No Rubor: No Scarring: No Temperature / Pain Moisture Temperature: No Abnormality No Abnormalities Noted: No Tenderness on Palpation: Yes Dry / Scaly: No Maceration: No Moist: No Wound Preparation Ulcer Cleansing: Rinsed/Irrigated with Saline Topical Anesthetic Applied:  None Treatment Notes Wound #2 (Left, Anterior Lower Leg) 1. Cleansed with: Clean wound with Normal Saline 2. Anesthetic Topical Lidocaine 4% cream to wound bed prior to debridement 4. Dressing Applied: Santyl Ointment 5. Secondary Dressing Applied Bordered Foam Dressing Electronic Signature(s) Signed: 12/17/2015 4:11:07 PM By: Leane Call Entered By: Leane Call on 12/17/2015 14:25:17 [name] ([id number]) -------------------------------------------------------------------------------- Wound Assessment Details Patient Name: [name] Date of Service: 12/17/2015 1:30 PM Medical Record Number: [id number] Patient  Account Number: [id number] Date of Birth/Sex: [birth year] (80 y.o. Female) Treating RN: Leane Call Primary Care Physician: Halina Maidens Other Clinician: Referring Physician: Halina Maidens Treating Physician/Extender: Frann Rider in Treatment: 2 Wound Status Wound Number: 3 Primary Diabetic Wound/Ulcer of the Lower Etiology: Extremity Wound Location: Right Lower Leg - Anterior Wound Open Wounding Event: Gradually Appeared Status: Date Acquired: 11/03/2015 Comorbid Chronic Obstructive Pulmonary Disease Weeks Of Treatment: 2 History: (COPD), Angina, Congestive Heart Clustered Wound: No Failure, Type II Diabetes, Lupus Erythematosus, Osteoarthritis, Dementia Photos Photo Uploaded By: Gretta Cool, RN, BSN, Kim on 12/17/2015 16:51:15 Wound Measurements Length: (cm) 0.3 Width: (cm) 0.5 Depth: (cm) 0.1 Area: (cm) 0.118 Volume: (cm) 0.012 % Reduction in Area: 72.2% % Reduction in Volume: 71.4% Epithelialization: None Tunneling: No Undermining: No Wound Description Classification: Grade 1 Wound Margin: Distinct, outline attached Exudate Amount: Small Exudate Type: Serosanguineous Exudate Color: red, brown Foul Odor After Cleansing: No Wound Bed Granulation Amount: Medium (34-66%) Exposed Structure Granulation Quality:  Pink, Pale Fascia Exposed: No Necrotic Amount: Medium (34-66%) Fat Layer Exposed: No SUNG, GAVAGHAN ([id number]) Necrotic Quality: Adherent Slough Tendon Exposed: No Muscle Exposed: No Joint Exposed: No Bone Exposed: No Limited to Skin Breakdown Periwound Skin Texture Texture Color No Abnormalities Noted: No No Abnormalities Noted: No Callus: No Atrophie Blanche: No Crepitus: No Cyanosis: No Excoriation: No Ecchymosis: No Fluctuance: No Erythema: No Friable: No Hemosiderin Staining: No Induration: No Mottled: No Localized Edema: Yes Pallor: No Rash: No Rubor: No Scarring: No Temperature / Pain Moisture Temperature: No Abnormality No Abnormalities Noted: No Tenderness on Palpation: Yes Dry / Scaly: No Maceration: No Moist: Yes Wound Preparation Ulcer Cleansing: Rinsed/Irrigated with Saline Topical Anesthetic Applied: Other: lidocaine 4%, Treatment Notes Wound #3 (Right, Anterior Lower Leg) 1. Cleansed with: Clean wound with Normal Saline 2. Anesthetic Topical Lidocaine 4% cream to wound bed prior to debridement 4. Dressing Applied: Santyl Ointment 5. Secondary Dressing Applied Bordered Foam Dressing Electronic Signature(s) Signed: 12/17/2015 4:11:07 PM By: Leane Call Entered By: Leane Call on 12/17/2015 14:26:03 [name] ([id number]) -------------------------------------------------------------------------------- Wound Assessment Details Patient Name: [name] Date of Service: 12/17/2015 1:30 PM Medical Record Number: [id number] Patient Account Number: [id number] Date of Birth/Sex: [birth year] (80 y.o. Female) Treating RN: Leane Call Primary Care Physician: Halina Maidens Other Clinician: Referring Physician: Halina Maidens Treating Physician/Extender: Frann Rider in Treatment: 2 Wound Status Wound Number: 4 Primary Diabetic Wound/Ulcer of the Lower Etiology: Extremity Wound Location: Right Lower Leg -  Medial Wound Open Wounding Event: Gradually Appeared Status: Date Acquired: 11/03/2015 Comorbid Chronic Obstructive Pulmonary Disease Weeks Of Treatment: 2 History: (COPD), Angina, Congestive Heart Clustered Wound: No Failure, Type II Diabetes, Lupus Erythematosus, Osteoarthritis, Dementia Photos Photo Uploaded By: Gretta Cool, RN, BSN, Kim on 12/17/2015 16:52:17 Wound Measurements Length: (cm) 1.1 Width: (cm) 1.8 Depth: (cm) 0.2 Area: (cm) 1.555 Volume: (cm) 0.311 % Reduction in Area: 36.5% % Reduction in Volume: -26.9% Epithelialization: None Tunneling: No Undermining: No Wound Description Classification: Grade 1 Wound Margin: Distinct, outline attached Exudate Amount: Medium Exudate Type: Serosanguineous Exudate Color: red, brown Foul Odor After Cleansing: No Wound Bed Granulation Amount: Small (1-33%) Exposed Structure Granulation Quality: Pink, Pale Fascia Exposed: No Necrotic Amount: Large (67-100%) Fat Layer Exposed: No ITZY, PEPITONE ([id number]) Necrotic Quality: Adherent Slough Tendon Exposed: No Muscle Exposed: No Joint Exposed: No Bone Exposed: No Limited to Skin Breakdown Periwound Skin Texture Texture Color No Abnormalities Noted: No No Abnormalities Noted: No Callus: No Atrophie Blanche: No Crepitus: No Cyanosis: No Excoriation: No  Ecchymosis: No Fluctuance: No Erythema: No Friable: No Hemosiderin Staining: No Induration: No Mottled: No Localized Edema: Yes Pallor: No Rash: No Rubor: No Scarring: No Temperature / Pain Moisture Temperature: No Abnormality No Abnormalities Noted: No Tenderness on Palpation: Yes Dry / Scaly: No Maceration: No Moist: Yes Wound Preparation Ulcer Cleansing: Rinsed/Irrigated with Saline Topical Anesthetic Applied: Other: lidocaine 4%, Treatment Notes Wound #4 (Right, Medial Lower Leg) 1. Cleansed with: Clean wound with Normal Saline 2. Anesthetic Topical Lidocaine 4% cream to wound bed prior to  debridement 4. Dressing Applied: Santyl Ointment 5. Secondary Dressing Applied Bordered Foam Dressing Electronic Signature(s) Signed: 12/17/2015 4:11:07 PM By: Leane Call Entered By: Leane Call on 12/17/2015 14:26:42 [name] ([id number]) -------------------------------------------------------------------------------- Wound Assessment Details Patient Name: [name] Date of Service: 12/17/2015 1:30 PM Medical Record Number: [id number] Patient Account Number: [id number] Date of Birth/Sex: 01-Oct-1935 (80 y.o. Female) Treating RN: Leane Call Primary Care Physician: Halina Maidens Other Clinician: Referring Physician: Halina Maidens Treating Physician/Extender: Frann Rider in Treatment: 2 Wound Status Wound Number: 5 Primary Diabetic Wound/Ulcer of the Lower Etiology: Extremity Wound Location: Right Calcaneus - Posterior Wound Open Wounding Event: Gradually Appeared Status: Date Acquired: 11/03/2015 Comorbid Chronic Obstructive Pulmonary Disease Weeks Of Treatment: 2 History: (COPD), Angina, Congestive Heart Clustered Wound: No Failure, Type II Diabetes, Lupus Erythematosus, Osteoarthritis, Dementia Photos Photo Uploaded By: Gretta Cool, RN, BSN, Kim on 12/17/2015 16:52:18 Wound Measurements Length: (cm) 3.5 Width: (cm) 2.8 Depth: (cm) 0.2 Area: (cm) 7.697 Volume: (cm) 1.539 % Reduction in Area: 19.4% % Reduction in Volume: -61.2% Epithelialization: Small (1-33%) Tunneling: No Undermining: No Wound Description Classification: Grade 1 Wound Margin: Distinct, outline attached Exudate Amount: Large Exudate Type: Serosanguineous Exudate Color: red, brown Foul Odor After Cleansing: Yes Due to Product Use: No Wound Bed Granulation Amount: None Present (0%) Exposed Structure Necrotic Amount: Large (67-100%) Fascia Exposed: No Necrotic Quality: Adherent Slough Fat Layer Exposed: No DOMINI, DHILLON ([id number]) Tendon Exposed:  No Muscle Exposed: No Joint Exposed: No Bone Exposed: No Limited to Skin Breakdown Periwound Skin Texture Texture Color No Abnormalities Noted: No No Abnormalities Noted: No Callus: No Atrophie Blanche: No Crepitus: No Cyanosis: No Excoriation: No Ecchymosis: No Fluctuance: No Erythema: No Friable: No Hemosiderin Staining: No Induration: No Mottled: Yes Localized Edema: Yes Pallor: No Rash: No Rubor: No Scarring: No Temperature / Pain Moisture Temperature: No Abnormality No Abnormalities Noted: No Dry / Scaly: No Maceration: Yes Moist: Yes Wound Preparation Ulcer Cleansing: Rinsed/Irrigated with Saline Topical Anesthetic Applied: Other: lidocaine 4%, Treatment Notes Wound #5 (Right, Posterior Calcaneus) 1. Cleansed with: Clean wound with Normal Saline 2. Anesthetic Topical Lidocaine 4% cream to wound bed prior to debridement 4. Dressing Applied: Santyl Ointment 5. Secondary Dressing Applied Bordered Foam Dressing Electronic Signature(s) Signed: 12/17/2015 4:11:07 PM By: Leane Call Entered By: Leane Call on 12/17/2015 14:27:24 [name] ([id number]) -------------------------------------------------------------------------------- [place] Details Patient Name: [name] Date of Service: 12/17/2015 1:30 PM Medical Record Number: [id number] Patient Account Number: [id number] Date of Birth/Sex: Sep 01, 1935 (80 y.o. Female) Treating RN: Leane Call Primary Care Physician: Halina Maidens Other Clinician: Referring Physician: Halina Maidens Treating Physician/Extender: Frann Rider in Treatment: 2 Vital Signs Time Taken: 14:00 Temperature (F): 98.2 Height (in): 64 Pulse (bpm): 64 Source: Stated Respiratory Rate (breaths/min): 16 Weight (lbs): 110 Blood Pressure (mmHg): 118/60 Source: Stated Reference Range: 80 - 120 mg / dl Body Mass Index (BMI): 18.9 Electronic Signature(s) Signed: 12/17/2015 4:11:07 PM By: Leane Call Entered By: Leane Call on  12/17/2015 14:10:26 

## 2015-12-20 ENCOUNTER — Other Ambulatory Visit: Payer: Self-pay | Admitting: Internal Medicine

## 2015-12-20 DIAGNOSIS — N181 Chronic kidney disease, stage 1: Principal | ICD-10-CM

## 2015-12-20 DIAGNOSIS — F0390 Unspecified dementia without behavioral disturbance: Secondary | ICD-10-CM | POA: Diagnosis not present

## 2015-12-20 DIAGNOSIS — L89890 Pressure ulcer of other site, unstageable: Secondary | ICD-10-CM | POA: Diagnosis not present

## 2015-12-20 DIAGNOSIS — L89610 Pressure ulcer of right heel, unstageable: Secondary | ICD-10-CM | POA: Diagnosis not present

## 2015-12-20 DIAGNOSIS — M3501 Sicca syndrome with keratoconjunctivitis: Secondary | ICD-10-CM | POA: Diagnosis not present

## 2015-12-20 DIAGNOSIS — L89620 Pressure ulcer of left heel, unstageable: Secondary | ICD-10-CM | POA: Diagnosis not present

## 2015-12-20 DIAGNOSIS — I5032 Chronic diastolic (congestive) heart failure: Secondary | ICD-10-CM | POA: Diagnosis not present

## 2015-12-20 DIAGNOSIS — S72001D Fracture of unspecified part of neck of right femur, subsequent encounter for closed fracture with routine healing: Secondary | ICD-10-CM | POA: Diagnosis not present

## 2015-12-20 DIAGNOSIS — E1122 Type 2 diabetes mellitus with diabetic chronic kidney disease: Secondary | ICD-10-CM

## 2015-12-20 MED ORDER — METFORMIN HCL ER 500 MG PO TB24: 500.0000 mg | ORAL_TABLET | Freq: Every day | ORAL | Status: DC

## 2015-12-21 DIAGNOSIS — L89610 Pressure ulcer of right heel, unstageable: Secondary | ICD-10-CM | POA: Diagnosis not present

## 2015-12-21 DIAGNOSIS — I5032 Chronic diastolic (congestive) heart failure: Secondary | ICD-10-CM | POA: Diagnosis not present

## 2015-12-21 DIAGNOSIS — L89890 Pressure ulcer of other site, unstageable: Secondary | ICD-10-CM | POA: Diagnosis not present

## 2015-12-21 DIAGNOSIS — L89620 Pressure ulcer of left heel, unstageable: Secondary | ICD-10-CM | POA: Diagnosis not present

## 2015-12-21 DIAGNOSIS — F0390 Unspecified dementia without behavioral disturbance: Secondary | ICD-10-CM | POA: Diagnosis not present

## 2015-12-21 DIAGNOSIS — S72001D Fracture of unspecified part of neck of right femur, subsequent encounter for closed fracture with routine healing: Secondary | ICD-10-CM | POA: Diagnosis not present

## 2015-12-22 ENCOUNTER — Encounter: Payer: Self-pay | Admitting: Internal Medicine

## 2015-12-22 ENCOUNTER — Emergency Department: Payer: Medicare Other

## 2015-12-22 ENCOUNTER — Inpatient Hospital Stay
Admission: EM | Admit: 2015-12-22 | Discharge: 2015-12-28 | DRG: 853 | Disposition: A | Discharge status: Hospice | Payer: Medicare Other | Attending: Internal Medicine | Admitting: Internal Medicine

## 2015-12-22 DIAGNOSIS — I70238 Atherosclerosis of native arteries of right leg with ulceration of other part of lower right leg: Secondary | ICD-10-CM | POA: Diagnosis not present

## 2015-12-22 DIAGNOSIS — Z8249 Family history of ischemic heart disease and other diseases of the circulatory system: Secondary | ICD-10-CM

## 2015-12-22 DIAGNOSIS — L89619 Pressure ulcer of right heel, unspecified stage: Secondary | ICD-10-CM | POA: Diagnosis not present

## 2015-12-22 DIAGNOSIS — R0789 Other chest pain: Secondary | ICD-10-CM | POA: Diagnosis not present

## 2015-12-22 DIAGNOSIS — F0390 Unspecified dementia without behavioral disturbance: Secondary | ICD-10-CM | POA: Diagnosis present

## 2015-12-22 DIAGNOSIS — I248 Other forms of acute ischemic heart disease: Secondary | ICD-10-CM | POA: Diagnosis not present

## 2015-12-22 DIAGNOSIS — L89512 Pressure ulcer of right ankle, stage 2: Secondary | ICD-10-CM | POA: Diagnosis present

## 2015-12-22 DIAGNOSIS — M329 Systemic lupus erythematosus, unspecified: Secondary | ICD-10-CM | POA: Diagnosis present

## 2015-12-22 DIAGNOSIS — S3993XA Unspecified injury of pelvis, initial encounter: Secondary | ICD-10-CM | POA: Diagnosis not present

## 2015-12-22 DIAGNOSIS — J449 Chronic obstructive pulmonary disease, unspecified: Secondary | ICD-10-CM | POA: Diagnosis present

## 2015-12-22 DIAGNOSIS — I639 Cerebral infarction, unspecified: Secondary | ICD-10-CM | POA: Diagnosis not present

## 2015-12-22 DIAGNOSIS — J690 Pneumonitis due to inhalation of food and vomit: Secondary | ICD-10-CM | POA: Diagnosis present

## 2015-12-22 DIAGNOSIS — Z7901 Long term (current) use of anticoagulants: Secondary | ICD-10-CM

## 2015-12-22 DIAGNOSIS — K5641 Fecal impaction: Secondary | ICD-10-CM | POA: Diagnosis present

## 2015-12-22 DIAGNOSIS — J189 Pneumonia, unspecified organism: Secondary | ICD-10-CM | POA: Diagnosis not present

## 2015-12-22 DIAGNOSIS — L89613 Pressure ulcer of right heel, stage 3: Secondary | ICD-10-CM | POA: Diagnosis present

## 2015-12-22 DIAGNOSIS — R918 Other nonspecific abnormal finding of lung field: Secondary | ICD-10-CM | POA: Diagnosis not present

## 2015-12-22 DIAGNOSIS — Z87891 Personal history of nicotine dependence: Secondary | ICD-10-CM | POA: Diagnosis not present

## 2015-12-22 DIAGNOSIS — K219 Gastro-esophageal reflux disease without esophagitis: Secondary | ICD-10-CM | POA: Diagnosis present

## 2015-12-22 DIAGNOSIS — E119 Type 2 diabetes mellitus without complications: Secondary | ICD-10-CM | POA: Diagnosis not present

## 2015-12-22 DIAGNOSIS — Z881 Allergy status to other antibiotic agents status: Secondary | ICD-10-CM | POA: Diagnosis not present

## 2015-12-22 DIAGNOSIS — E11621 Type 2 diabetes mellitus with foot ulcer: Secondary | ICD-10-CM | POA: Diagnosis present

## 2015-12-22 DIAGNOSIS — L089 Local infection of the skin and subcutaneous tissue, unspecified: Secondary | ICD-10-CM

## 2015-12-22 DIAGNOSIS — Z87442 Personal history of urinary calculi: Secondary | ICD-10-CM

## 2015-12-22 DIAGNOSIS — L8990 Pressure ulcer of unspecified site, unspecified stage: Secondary | ICD-10-CM | POA: Diagnosis present

## 2015-12-22 DIAGNOSIS — R4182 Altered mental status, unspecified: Secondary | ICD-10-CM | POA: Diagnosis not present

## 2015-12-22 DIAGNOSIS — Z66 Do not resuscitate: Secondary | ICD-10-CM

## 2015-12-22 DIAGNOSIS — L97509 Non-pressure chronic ulcer of other part of unspecified foot with unspecified severity: Secondary | ICD-10-CM | POA: Diagnosis not present

## 2015-12-22 DIAGNOSIS — I7300 Raynaud's syndrome without gangrene: Secondary | ICD-10-CM | POA: Diagnosis present

## 2015-12-22 DIAGNOSIS — E039 Hypothyroidism, unspecified: Secondary | ICD-10-CM | POA: Diagnosis present

## 2015-12-22 DIAGNOSIS — L97512 Non-pressure chronic ulcer of other part of right foot with fat layer exposed: Secondary | ICD-10-CM | POA: Diagnosis not present

## 2015-12-22 DIAGNOSIS — E46 Unspecified protein-calorie malnutrition: Secondary | ICD-10-CM | POA: Diagnosis present

## 2015-12-22 DIAGNOSIS — A419 Sepsis, unspecified organism: Secondary | ICD-10-CM | POA: Diagnosis present

## 2015-12-22 DIAGNOSIS — R29810 Facial weakness: Secondary | ICD-10-CM | POA: Diagnosis present

## 2015-12-22 DIAGNOSIS — L97419 Non-pressure chronic ulcer of right heel and midfoot with unspecified severity: Secondary | ICD-10-CM | POA: Diagnosis not present

## 2015-12-22 DIAGNOSIS — Z841 Family history of disorders of kidney and ureter: Secondary | ICD-10-CM

## 2015-12-22 DIAGNOSIS — Z8711 Personal history of peptic ulcer disease: Secondary | ICD-10-CM

## 2015-12-22 DIAGNOSIS — I480 Paroxysmal atrial fibrillation: Secondary | ICD-10-CM | POA: Diagnosis not present

## 2015-12-22 DIAGNOSIS — M25559 Pain in unspecified hip: Secondary | ICD-10-CM | POA: Diagnosis not present

## 2015-12-22 DIAGNOSIS — R0602 Shortness of breath: Secondary | ICD-10-CM

## 2015-12-22 DIAGNOSIS — I5032 Chronic diastolic (congestive) heart failure: Secondary | ICD-10-CM | POA: Diagnosis present

## 2015-12-22 DIAGNOSIS — Z96643 Presence of artificial hip joint, bilateral: Secondary | ICD-10-CM | POA: Diagnosis present

## 2015-12-22 DIAGNOSIS — G9341 Metabolic encephalopathy: Secondary | ICD-10-CM | POA: Diagnosis present

## 2015-12-22 DIAGNOSIS — R531 Weakness: Secondary | ICD-10-CM | POA: Diagnosis not present

## 2015-12-22 DIAGNOSIS — R0603 Acute respiratory distress: Secondary | ICD-10-CM | POA: Diagnosis present

## 2015-12-22 DIAGNOSIS — Z8041 Family history of malignant neoplasm of ovary: Secondary | ICD-10-CM

## 2015-12-22 DIAGNOSIS — R6889 Other general symptoms and signs: Secondary | ICD-10-CM | POA: Diagnosis not present

## 2015-12-22 DIAGNOSIS — Z515 Encounter for palliative care: Secondary | ICD-10-CM | POA: Diagnosis not present

## 2015-12-22 DIAGNOSIS — Z833 Family history of diabetes mellitus: Secondary | ICD-10-CM

## 2015-12-22 DIAGNOSIS — L97412 Non-pressure chronic ulcer of right heel and midfoot with fat layer exposed: Secondary | ICD-10-CM

## 2015-12-22 DIAGNOSIS — L89609 Pressure ulcer of unspecified heel, unspecified stage: Secondary | ICD-10-CM | POA: Diagnosis not present

## 2015-12-22 DIAGNOSIS — R079 Chest pain, unspecified: Secondary | ICD-10-CM | POA: Diagnosis not present

## 2015-12-22 DIAGNOSIS — I4891 Unspecified atrial fibrillation: Secondary | ICD-10-CM | POA: Diagnosis not present

## 2015-12-22 DIAGNOSIS — G9340 Encephalopathy, unspecified: Secondary | ICD-10-CM | POA: Diagnosis not present

## 2015-12-22 DIAGNOSIS — Z803 Family history of malignant neoplasm of breast: Secondary | ICD-10-CM

## 2015-12-22 LAB — CBC WITH DIFFERENTIAL/PLATELET
BASOS ABS: 0.1 10*3/uL (ref 0–0.1)
Basophils Relative: 0 %
EOS PCT: 0 %
Eosinophils Absolute: 0 10*3/uL (ref 0–0.7)
HCT: 32.9 % — ABNORMAL LOW (ref 35.0–47.0)
Hemoglobin: 11 g/dL — ABNORMAL LOW (ref 12.0–16.0)
LYMPHS PCT: 4 %
Lymphs Abs: 0.7 10*3/uL — ABNORMAL LOW (ref 1.0–3.6)
MCH: 28.8 pg (ref 26.0–34.0)
MCHC: 33.3 g/dL (ref 32.0–36.0)
MCV: 86.5 fL (ref 80.0–100.0)
MONO ABS: 1.4 10*3/uL — AB (ref 0.2–0.9)
Monocytes Relative: 8 %
Neutro Abs: 14.8 10*3/uL — ABNORMAL HIGH (ref 1.4–6.5)
Neutrophils Relative %: 88 %
PLATELETS: 232 10*3/uL (ref 150–440)
RBC: 3.81 MIL/uL (ref 3.80–5.20)
RDW: 15.4 % — AB (ref 11.5–14.5)
WBC: 17 10*3/uL — ABNORMAL HIGH (ref 3.6–11.0)

## 2015-12-22 LAB — COMPREHENSIVE METABOLIC PANEL
ALT: 34 U/L (ref 14–54)
ANION GAP: 7 (ref 5–15)
AST: 53 U/L — AB (ref 15–41)
Albumin: 3.2 g/dL — ABNORMAL LOW (ref 3.5–5.0)
Alkaline Phosphatase: 103 U/L (ref 38–126)
BUN: 15 mg/dL (ref 6–20)
CHLORIDE: 98 mmol/L — AB (ref 101–111)
CO2: 30 mmol/L (ref 22–32)
Calcium: 9 mg/dL (ref 8.9–10.3)
Creatinine, Ser: 0.73 mg/dL (ref 0.44–1.00)
Glucose, Bld: 283 mg/dL — ABNORMAL HIGH (ref 65–99)
POTASSIUM: 4 mmol/L (ref 3.5–5.1)
Sodium: 135 mmol/L (ref 135–145)
Total Bilirubin: 0.6 mg/dL (ref 0.3–1.2)
Total Protein: 7.1 g/dL (ref 6.5–8.1)

## 2015-12-22 LAB — URINALYSIS COMPLETE WITH MICROSCOPIC (ARMC ONLY)
BILIRUBIN URINE: NEGATIVE
Bacteria, UA: NONE SEEN
Hgb urine dipstick: NEGATIVE
Leukocytes, UA: NEGATIVE
Nitrite: NEGATIVE
PROTEIN: 30 mg/dL — AB
RBC / HPF: NONE SEEN RBC/hpf (ref 0–5)
Specific Gravity, Urine: 1.022 (ref 1.005–1.030)
pH: 6 (ref 5.0–8.0)

## 2015-12-22 LAB — GLUCOSE, CAPILLARY
GLUCOSE-CAPILLARY: 220 mg/dL — AB (ref 65–99)
GLUCOSE-CAPILLARY: 152 mg/dL — AB (ref 65–99)

## 2015-12-22 LAB — SEDIMENTATION RATE: Sed Rate: 76 mm/hr — ABNORMAL HIGH (ref 0–30)

## 2015-12-22 LAB — TROPONIN I

## 2015-12-22 MED ORDER — ACETAMINOPHEN 650 MG RE SUPP: 650.0000 mg | Freq: Four times a day (QID) | RECTAL | Status: DC | PRN

## 2015-12-22 MED ORDER — METOPROLOL TARTRATE 25 MG PO TABS
25.0000 mg | ORAL_TABLET | Freq: Two times a day (BID) | ORAL | Status: DC
  Administered 2015-12-23 – 2015-12-28 (×9): 25 mg via ORAL
  Filled 2015-12-22 (×10): qty 1

## 2015-12-22 MED ORDER — SODIUM CHLORIDE 0.9% FLUSH
3.0000 mL | Freq: Two times a day (BID) | INTRAVENOUS | Status: DC
  Administered 2015-12-22 – 2015-12-26 (×5): 3 mL via INTRAVENOUS

## 2015-12-22 MED ORDER — SODIUM CHLORIDE 0.9 % IV SOLN
Freq: Once | INTRAVENOUS | Status: AC
  Administered 2015-12-22: 15:00:00 via INTRAVENOUS

## 2015-12-22 MED ORDER — CLINDAMYCIN PHOSPHATE 600 MG/50ML IV SOLN
600.0000 mg | Freq: Three times a day (TID) | INTRAVENOUS | Status: DC
  Administered 2015-12-22 – 2015-12-27 (×14): 600 mg via INTRAVENOUS
  Filled 2015-12-22 (×17): qty 50

## 2015-12-22 MED ORDER — ACETAMINOPHEN 325 MG PO TABS: 650.0000 mg | ORAL_TABLET | Freq: Four times a day (QID) | ORAL | Status: DC | PRN

## 2015-12-22 MED ORDER — INSULIN ASPART 100 UNIT/ML ~~LOC~~ SOLN
0.0000 [IU] | Freq: Three times a day (TID) | SUBCUTANEOUS | Status: DC
  Administered 2015-12-22: 3 [IU] via SUBCUTANEOUS
  Administered 2015-12-23: 5 [IU] via SUBCUTANEOUS
  Administered 2015-12-23: 3 [IU] via SUBCUTANEOUS
  Filled 2015-12-22 (×2): qty 3
  Filled 2015-12-22: qty 5

## 2015-12-22 MED ORDER — AMIODARONE HCL 200 MG PO TABS
200.0000 mg | ORAL_TABLET | Freq: Every day | ORAL | Status: DC
  Administered 2015-12-23 – 2015-12-28 (×6): 200 mg via ORAL
  Filled 2015-12-22 (×6): qty 1

## 2015-12-22 MED ORDER — MOMETASONE FURO-FORMOTEROL FUM 100-5 MCG/ACT IN AERO
2.0000 | INHALATION_SPRAY | Freq: Two times a day (BID) | RESPIRATORY_TRACT | Status: DC
  Administered 2015-12-24 – 2015-12-28 (×7): 2 via RESPIRATORY_TRACT
  Filled 2015-12-22 (×2): qty 8.8

## 2015-12-22 MED ORDER — INSULIN ASPART 100 UNIT/ML ~~LOC~~ SOLN
0.0000 [IU] | Freq: Every day | SUBCUTANEOUS | Status: DC
  Administered 2015-12-23: 5 [IU] via SUBCUTANEOUS
  Administered 2015-12-26: 2 [IU] via SUBCUTANEOUS
  Filled 2015-12-22: qty 5
  Filled 2015-12-22: qty 2

## 2015-12-22 MED ORDER — RIVAROXABAN 20 MG PO TABS
20.0000 mg | ORAL_TABLET | Freq: Every day | ORAL | Status: DC
  Administered 2015-12-22: 20 mg via ORAL
  Filled 2015-12-22: qty 1

## 2015-12-22 MED ORDER — FAMOTIDINE IN NACL 20-0.9 MG/50ML-% IV SOLN
20.0000 mg | Freq: Two times a day (BID) | INTRAVENOUS | Status: DC
  Administered 2015-12-22 – 2015-12-24 (×4): 20 mg via INTRAVENOUS
  Filled 2015-12-22 (×5): qty 50

## 2015-12-22 MED ORDER — SODIUM CHLORIDE 0.9 % IV SOLN
INTRAVENOUS | Status: DC
  Administered 2015-12-22 (×2): via INTRAVENOUS

## 2015-12-22 MED ORDER — TIOTROPIUM BROMIDE MONOHYDRATE 18 MCG IN CAPS
18.0000 ug | ORAL_CAPSULE | Freq: Every day | RESPIRATORY_TRACT | Status: DC
  Administered 2015-12-22 – 2015-12-25 (×3): 18 ug via RESPIRATORY_TRACT
  Filled 2015-12-22 (×2): qty 5

## 2015-12-22 MED ORDER — ALBUTEROL SULFATE (2.5 MG/3ML) 0.083% IN NEBU
2.5000 mg | INHALATION_SOLUTION | Freq: Four times a day (QID) | RESPIRATORY_TRACT | Status: DC | PRN
  Administered 2015-12-23 (×2): 2.5 mg via RESPIRATORY_TRACT
  Filled 2015-12-22 (×2): qty 3

## 2015-12-22 NOTE — H&P (Signed)
[facility] [place] NAME: [name]    MR#:  [id number]  DATE OF BIRTH:  01-12-1936  DATE OF ADMISSION:  12/22/2015  PRIMARY CARE PHYSICIAN: Halina Maidens, MD   REQUESTING/REFERRING PHYSICIAN: Dr Conni Slipper  CHIEF COMPLAINT:   Chief Complaint  Patient presents with  . Fatigue  . Pressure Ulcer    HISTORY OF PRESENT ILLNESS:  [name]  is a 80 y.o. female presents with altered mental status and sleeping a lot. Brought in by family. Yesterday she was sleeping a lot having chills and cold feeling. She is not been herself. She urinated in the bed. She's been pretty unresponsive and couldn't walk or talk very well. Yesterday family noticed eyelids was drooping and she did not eat or drink. She was rectally impacted and had some abdominal pain on presentation and was disimpacted. She felt like vomiting. She is also had the urge to urinate.  PAST MEDICAL HISTORY:   Past Medical History  Diagnosis Date  . DM (diabetes mellitus) ([place])   . Mitral valve prolapse     a. Not noted on 03/2015 Echo.  Marland Kitchen COPD (chronic obstructive pulmonary disease) ([place])   . Palpitations   . Osteoarthritis   . Systemic lupus erythematosus ([place])   . Lumbar spondylolysis   . Raynaud's disease   . Anxiety   . IBS (irritable bowel syndrome)   . Uveitis, anterior     ????  . Rheumatic fever   . Ankylosing spondylitis ([place])     ? how diagnosed  . Insomnia, unspecified   . Chronic peptic ulcer, unspecified site, without mention of hemorrhage, perforation, or obstruction   . History of kidney stones   . Chronic kidney infection   . Asthma     as child  . Hypothyroidism     mass, U/S 6/21 - nodules  . Motion sickness     car - back seat  . GERD (gastroesophageal reflux disease)   . Wears dentures     full upper, partial lower  . Wears contact lenses   . COPD (chronic obstructive pulmonary disease) ([place])     a.Uses 2l prn - followed by  Dr. Raul Del.  . Raynaud disease   . Headache   . Neuropathy ([place])   . Wheezing   . Chronic diastolic CHF (congestive heart failure) ([place])     a. 03/2015 Echo: EF 55-60%, Gr 1 DD.  Marland Kitchen PAF (paroxysmal atrial fibrillation) ([place])     a. 03/2015 in setting of hip Fx->Amio/Xarelo;  b. CHA2DS2VASc= 4.  . Left displaced femoral neck fracture ([place])     a. 03/2015 s/p L hemiarthroplasty.  . Diabetes mellitus ([place])     PAST SURGICAL HISTORY:   Past Surgical History  Procedure Laterality Date  . Esophagogastroduodenoscopy  multiple  . Colonoscopy  multiple  . Total abdominal hysterectomy w/ bilateral salpingoophorectomy  1990  . Breast biopsy    . Biopsy thyroid    . Esophagogastroduodenoscopy (egd) with propofol N/A 12/11/2014    Procedure: ESOPHAGOGASTRODUODENOSCOPY (EGD) WITH PROPOFOL;  Surgeon: [name], MD;  Location: [facility];  Service: Endoscopy;  Laterality: N/A;  cytology brushing  . Cataract extraction w/phaco Left 03/23/2015    Procedure: CATARACT EXTRACTION PHACO AND INTRAOCULAR LENS PLACEMENT (IOC);  Surgeon: Birder Robson, MD;  Location: [facility];  Service: Ophthalmology;  Laterality: Left;  [place] 00:51   . Hip arthroplasty Left 03/26/2015    Procedure: ARTHROPLASTY BIPOLAR HIP (HEMIARTHROPLASTY);  Surgeon: Earnestine Leys, MD;  Location: [facility];  Service: Orthopedics;  Laterality: Left;  . Cataract extraction w/phaco Right 05/25/2015    Procedure: CATARACT EXTRACTION PHACO AND INTRAOCULAR LENS PLACEMENT (IOC);  Surgeon: Birder Robson, MD;  Location: [facility];  Service: Ophthalmology;  Laterality: Right;  [place] 00:57   . Joint replacement Left   . Hip arthroplasty Right 11/06/2015    Procedure: ARTHROPLASTY BIPOLAR HIP (HEMIARTHROPLASTY);  Surgeon: Thornton Park, MD;  Location: [facility];  Service: Orthopedics;  Laterality: Right;    SOCIAL HISTORY:   Social History  Substance Use Topics  . Smoking status: Former Smoker -- 0.25 packs/day for 40 years    Types:  Cigarettes    Quit date: 10/11/2011  . Smokeless tobacco: Never Used     Comment: Quit 3 years  . Alcohol Use: No    FAMILY HISTORY:   Family History  Problem Relation Age of Onset  . Kidney disease Brother   . Heart disease Mother   . Hypothyroidism Mother   . Diabetes Mother   . Heart attack Mother   . Hypertension Mother   . Heart disease Father   . Heart attack Father   . Breast cancer Sister   . Hypothyroidism Sister   . Diabetes Sister   . Ovarian cancer Sister   . Diabetes Brother   . Prostate cancer Brother   . Hypothyroidism Daughter     DRUG ALLERGIES:   Allergies  Allergen Reactions  . Codeine Nausea And Vomiting  . Penicillins Hives    Has patient had a PCN reaction causing immediate rash, facial/tongue/throat swelling, SOB or lightheadedness with hypotension: no  Has patient had a PCN reaction causing severe rash involving mucus membranes or skin necrosis: no  Has patient had a PCN reaction that required hospitalization: no  Has patient had a PCN reaction occurring within the last 10 years: no  If all of the above answers are "NO", then may proceed with Cephalosporin use.   . Sulfa Antibiotics   . Doxycycline Rash  . Erythromycin Rash  . Latex Rash    REVIEW OF SYSTEMS:  CONSTITUTIONAL: Chills and sweats EYES: No blurred or double vision.  EARS, NOSE, AND THROAT:  No sore throat. Patient denies trouble swallowing RESPIRATORY: Patient denies cough, shortness of breath.  CARDIOVASCULAR: No chest pain.  GASTROINTESTINAL: No nausea, vomiting. Some abdominal pain and constipation.  GENITOURINARY: No dysuria, hematuria.  HEMATOLOGY: No anemia SKIN: Decubiti right heel MUSCULOSKELETAL: No joint pain.   NEUROLOGIC: Family noticed drooping of the left mouth and eyelid PSYCHIATRY: No anxiety or depression.   MEDICATIONS AT HOME:   Prior to Admission medications   Medication Sig Start Date End Date Taking? Authorizing Provider  albuterol (PROVENTIL  HFA;VENTOLIN HFA) 108 (90 Base) MCG/ACT inhaler Inhale 2 puffs into the lungs 4 (four) times daily as needed for wheezing or shortness of breath.   Yes Historical Provider, MD  albuterol (PROVENTIL) (2.5 MG/3ML) 0.083% nebulizer solution Take 2.5 mg by nebulization every 6 (six) hours as needed for wheezing or shortness of breath. Reported on 07/09/2015   Yes Historical Provider, MD  amiodarone (PACERONE) 200 MG tablet Take 1 tablet (200 mg total) by mouth daily. 06/22/15  Yes Glean Hess, MD  docusate sodium (COLACE) 100 MG capsule Take 1 capsule (100 mg total) by mouth 2 (two) times daily. 11/10/15  Yes Srikar Sudini, MD  ferrous sulfate 325 (65 FE) MG tablet Take 1 tablet (325 mg total) by mouth 2 (two) times  daily with a meal. 11/10/15  Yes Srikar Sudini, MD  JANUVIA 100 MG tablet Take 0.5 tablets (50 mg total) by mouth daily. 06/22/15  Yes Glean Hess, MD  LORazepam (ATIVAN) 1 MG tablet Take 1 tablet (1 mg total) by mouth at bedtime. 11/10/15  Yes Srikar Sudini, MD  memantine (NAMENDA) 5 MG tablet Take 5 mg by mouth 2 (two) times daily.   Yes Historical Provider, MD  metFORMIN (GLUCOPHAGE-XR) 500 MG 24 hr tablet Take 1 tablet (500 mg total) by mouth daily before supper. 12/20/15  Yes Glean Hess, MD  metoprolol tartrate (LOPRESSOR) 25 MG tablet Take 25 mg by mouth 2 (two) times daily.   Yes Historical Provider, MD  mometasone-formoterol (DULERA) 100-5 MCG/ACT AERO Inhale 2 puffs into the lungs 2 (two) times daily. 07/16/15  Yes Glean Hess, MD  oxyCODONE (OXY IR/ROXICODONE) 5 MG immediate release tablet Take 1 tablet (5 mg total) by mouth every 4 (four) hours as needed for severe pain ((for MODERATE breakthrough pain)). 11/10/15  Yes Srikar Sudini, MD  Probiotic Product (ALIGN) 4 MG CAPS Take 1 capsule (4 mg total) by mouth daily. 06/22/15  Yes Glean Hess, MD  ranitidine (ZANTAC) 150 MG tablet Take 1 tablet (150 mg total) by mouth 2 (two) times daily. 06/22/15  Yes Glean Hess,  MD  rivaroxaban (XARELTO) 20 MG TABS tablet Take 1 tablet (20 mg total) by mouth daily. 06/22/15  Yes Glean Hess, MD  senna (SENOKOT) 8.6 MG TABS tablet Take 1 tablet by mouth 2 (two) times daily.   Yes Historical Provider, MD  tiotropium (SPIRIVA) 18 MCG inhalation capsule Place 1 capsule (18 mcg total) into inhaler and inhale daily. 06/22/15  Yes Glean Hess, MD  feeding supplement, ENSURE ENLIVE, (ENSURE ENLIVE) LIQD Take 237 mLs by mouth 2 (two) times daily between meals. 11/10/15   Srikar Sudini, MD      VITAL SIGNS:  Blood pressure 130/53, pulse 67, temperature 98.3 F (36.8 C), temperature source Oral, resp. rate 21, height 5\' 4"  (1.626 m), weight 49.896 kg (110 lb), SpO2 99 %.  PHYSICAL EXAMINATION:  GENERAL:  80 y.o.-year-old patient lying in the bed with no acute distress.  EYES: Pupils equal, round, reactive to light and accommodation. No scleral icterus. Extraocular muscles intact.  HEENT: Head atraumatic, normocephalic. Oropharynx and nasopharynx clear. Oropharynx is dry. NECK:  Supple, no jugular venous distention. No thyroid enlargement, no tenderness.  LUNGS: Normal breath sounds bilaterally, no wheezing, rales,rhonchi or crepitation. No use of accessory muscles of respiration.  CARDIOVASCULAR: S1, S2 normal. No murmurs, rubs, or gallops.  ABDOMEN: Soft, nontender, nondistended. Bowel sounds present. No organomegaly or mass.  EXTREMITIES: No pedal edema, cyanosis, or clubbing.  NEUROLOGIC: Cranial nerves II through XII are intact. Muscle strength 4+/5 in all extremities.  Gait not checked. Patient able to lift arms up off the bed. Patient able to lift left leg up off the bed patient able to lift right leg up off the bed. PSYCHIATRIC: The patient had to be woken up from sleep and then was able to answer some yes or no questions.  SKIN: Right heel decubiti, right medial ankle decubiti. Both having some drainage  LABORATORY PANEL:   CBC  Recent Labs Lab  12/22/15 1203  WBC 17.0*  HGB 11.0*  HCT 32.9*  PLT 232   ------------------------------------------------------------------------------------------------------------------  Chemistries   Recent Labs Lab 12/22/15 1203  NA 135  K 4.0  CL 98*  CO2 30  GLUCOSE  283*  BUN 15  CREATININE 0.73  CALCIUM 9.0  AST 53*  ALT 34  ALKPHOS 103  BILITOT 0.6   ------------------------------------------------------------------------------------------------------------------  Cardiac Enzymes  Recent Labs Lab 12/22/15 1203  TROPONINI <0.03   ------------------------------------------------------------------------------------------------------------------  RADIOLOGY:  Dg Chest 2 View  12/22/2015  CLINICAL DATA:  80 year old female with increased weakness and fatigue. EXAM: CHEST  2 VIEW COMPARISON:  11/09/2015 radiographs FINDINGS: The cardiomediastinal silhouette is unremarkable. Inferior right upper lobe airspace opacity likely represents pneumonia. There is no evidence of pulmonary edema, pneumothorax, mass or pleural effusion. No acute bony abnormalities are identified. IMPRESSION: Right upper lobe airspace opacity compatible with pneumonia. Radiographic follow-up to resolution is recommended. Electronically Signed   By: Margarette Canada M.D.   On: 12/22/2015 14:11   Dg Pelvis 1-2 Views  12/22/2015  CLINICAL DATA:  Pain following fall EXAM: PELVIS - 1-2 VIEW COMPARISON:  None. FINDINGS: There is no evidence of pelvic fracture or diastases. There are total hip replacements bilaterally with the prosthetic components appearing well-seated bilaterally. There is mild osteoarthritic change in both sacroiliac joints. There is calcification inferior to the right ischium, likely of chronic inflammatory etiology. IMPRESSION: Status post total hip replacements bilaterally with prosthetic components appearing well-seated. No acute fracture or dislocation. Mild osteoarthritic change in both sacroiliac  joints. Electronically Signed   By: Lowella Grip III M.D.   On: 12/22/2015 14:12   Ct Head Wo Contrast  12/22/2015  CLINICAL DATA:  80 year old female with increased weakness and fatigue and altered mental status. EXAM: CT HEAD WITHOUT CONTRAST TECHNIQUE: Contiguous axial images were obtained from the base of the skull through the vertex without intravenous contrast. COMPARISON:  07/01/2015 CT and MRI FINDINGS: Mild cerebral atrophy and moderate chronic small-vessel white matter ischemic changes again noted. No acute intracranial abnormalities are identified, including mass lesion or mass effect, hydrocephalus, extra-axial fluid collection, midline shift, hemorrhage, or acute infarction. The visualized bony calvarium is unremarkable. IMPRESSION: No evidence of acute intracranial abnormality. Atrophy and chronic small-vessel white matter ischemic changes. Electronically Signed   By: Margarette Canada M.D.   On: 12/22/2015 14:24   Dg Foot Complete Right  12/22/2015  CLINICAL DATA:  [place] 1-2 weeks ago, RIGHT foot ulcers which are bandaged EXAM: RIGHT FOOT COMPLETE - 3+ VIEW COMPARISON:  12/06/2015 FINDINGS: Dressing artifacts hindfoot. Osseous demineralization. Joint spaces preserved. No acute fracture, dislocation or bone destruction. IMPRESSION: No acute osseous abnormalities. Electronically Signed   By: Lavonia Dana M.D.   On: 12/22/2015 14:10    EKG:   Sinus rhythm 70 bpm  IMPRESSION AND PLAN:   1. Right upper lobe pneumonia with leukocytosis. Likely an aspiration pneumonia. ER physician ordered clindamycin and I will continue that. Blood cultures ordered and ER. 2. Right heel decubiti with some drainage. I will get a podiatry consultation. Get a wound culture. 3. Left facial droop, failed swallow evaluation. I will obtain an MRI of the brain. Get a swallow evaluation. Currently nothing by mouth. 4. History of atrial fibrillation. Currently in normal sinus rhythm. I will try to give Xarelto if able  to swallow and applesauce. I will try to give metoprolol and amiodarone if able to swallow and applesauce. 5. History of COPD. Give nebulizer treatments Spiriva and FLAIR 6. Type 2 diabetes mellitus. Sliding scale insulin 7. History of lupus and Raynaud's 8. As per family antibiotics upset the patient's stomach and usually uses Carafate. Until she passes the swallow evaluation I will give IV Pepcid.   All the records  are reviewed and case discussed with ED provider. Management plans discussed with the patient, family and they are in agreement.  CODE STATUS: DO NOT RESUSCITATE  TOTAL TIME TAKING CARE OF THIS PATIENT:  50 minutes.    Loletha Grayer M.D on 12/22/2015 at 4:39 PM  Between 7am to 6pm - Pager - [phone]  After 6pm call admission pager [phone]  [facility]  [phone]  CC: Primary care physician; Halina Maidens, MD

## 2015-12-22 NOTE — ED Provider Notes (Signed)
[facility] Emergency Department Provider Note   ____________________________________________  Time seen: Approximately 12:44 PM  I have reviewed the triage vital signs and the nursing notes.   HISTORY  Chief Complaint Fatigue and Pressure Ulcer    HPI [name] is a 80 y.o. female patient's living at home has a history of dementia COPD and multiple other medical problems. Yesterday seemed to be very sleepy and tired and was not talking as much his usual really was very weak and had trouble walking as well reports her left eyelid was droopy. Speech was very quiet but clear. Patient had much more drainage than usual out of her heel ulcer on the right heel. She is under the care of Dr. for the heel ulcer. Patient had a slight cough and clear nasal drainage yesterday.  Past Medical History  Diagnosis Date  . DM (diabetes mellitus) ([place])   . Mitral valve prolapse     a. Not noted on 03/2015 Echo.  Marland Kitchen COPD (chronic obstructive pulmonary disease) ([place])   . Palpitations   . Osteoarthritis   . Systemic lupus erythematosus ([place])   . Lumbar spondylolysis   . Raynaud's disease   . Anxiety   . IBS (irritable bowel syndrome)   . Uveitis, anterior     ????  . Rheumatic fever   . Ankylosing spondylitis ([place])     ? how diagnosed  . Insomnia, unspecified   . Chronic peptic ulcer, unspecified site, without mention of hemorrhage, perforation, or obstruction   . History of kidney stones   . Chronic kidney infection   . Asthma     as child  . Hypothyroidism     mass, U/S 6/21 - nodules  . Motion sickness     car - back seat  . GERD (gastroesophageal reflux disease)   . Wears dentures     full upper, partial lower  . Wears contact lenses   . COPD (chronic obstructive pulmonary disease) ([place])     a.Uses 2l prn - followed by Dr. Raul Del.  . Raynaud disease   . Headache   . Neuropathy ([place])   . Wheezing   . Chronic diastolic CHF (congestive heart  failure) ([place])     a. 03/2015 Echo: EF 55-60%, Gr 1 DD.  Marland Kitchen PAF (paroxysmal atrial fibrillation) ([place])     a. 03/2015 in setting of hip Fx->Amio/Xarelo;  b. CHA2DS2VASc= 4.  . Left displaced femoral neck fracture ([place])     a. 03/2015 s/p L hemiarthroplasty.  . Diabetes mellitus [facility])     Patient Active Problem List   Diagnosis Date Noted  . Hip fracture ([place]) 11/04/2015  . Chronic respiratory failure with hypoxia ([place]) 10/27/2015  . Mixed Alzheimer's and vascular dementia 07/29/2015  . Difficulty in walking 07/29/2015  . Type 2 diabetes mellitus with stage 1 chronic kidney disease, without long-term current use of insulin ([place]) 07/16/2015  . Tremor observed on examination 07/16/2015  . Urinary retention 07/12/2015  . Pressure ulcer of left heel 07/12/2015  . Malnutrition of moderate degree 07/03/2015  . Multi-infarct dementia 07/03/2015  . 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 Paroxysmal atrial fibrillation ([place]) 06/25/2015  . Chronic diastolic CHF (congestive heart failure) ([place])   . Left displaced femoral neck fracture ([place])   . Fall   . GERD (gastroesophageal reflux disease) 03/26/2015  . Anxiety 03/26/2015  . Gastroduodenal ulcer 01/05/2015  . Difficulty swallowing solids   . Esophageal candidiasis ([place])   . Acquired hypothyroidism  08/18/2014  . Systemic lupus erythematosus ([place]) 04/03/2014  . Severe chronic obstructive pulmonary disease ([place]) 10/28/2013    Past Surgical History  Procedure Laterality Date  . Esophagogastroduodenoscopy  multiple  . Colonoscopy  multiple  . Total abdominal hysterectomy w/ bilateral salpingoophorectomy  1990  . Breast biopsy    . Biopsy thyroid    . Esophagogastroduodenoscopy (egd) with propofol N/A 12/11/2014    Procedure: ESOPHAGOGASTRODUODENOSCOPY (EGD) WITH PROPOFOL;  Surgeon: [name], MD;  Location: [place];  Service: Endoscopy;  Laterality: N/A;  cytology brushing  . Cataract extraction w/phaco Left 03/23/2015     Procedure: CATARACT EXTRACTION PHACO AND INTRAOCULAR LENS PLACEMENT (IOC);  Surgeon: Birder Robson, MD;  Location: [facility];  Service: Ophthalmology;  Laterality: Left;  [place] 00:51   . Hip arthroplasty Left 03/26/2015    Procedure: ARTHROPLASTY BIPOLAR HIP (HEMIARTHROPLASTY);  Surgeon: Earnestine Leys, MD;  Location: [facility];  Service: Orthopedics;  Laterality: Left;  . Cataract extraction w/phaco Right 05/25/2015    Procedure: CATARACT EXTRACTION PHACO AND INTRAOCULAR LENS PLACEMENT (IOC);  Surgeon: Birder Robson, MD;  Location: [facility];  Service: Ophthalmology;  Laterality: Right;  [place] 00:57   . Joint replacement Left   . Hip arthroplasty Right 11/06/2015    Procedure: ARTHROPLASTY BIPOLAR HIP (HEMIARTHROPLASTY);  Surgeon: Thornton Park, MD;  Location: [facility];  Service: Orthopedics;  Laterality: Right;    Current Outpatient Rx  Name  Route  Sig  Dispense  Refill  . albuterol (PROVENTIL HFA;VENTOLIN HFA) 108 (90 Base) MCG/ACT inhaler   Inhalation   Inhale 2 puffs into the lungs 4 (four) times daily as needed for wheezing or shortness of breath.         Marland Kitchen albuterol (PROVENTIL) (2.5 MG/3ML) 0.083% nebulizer solution   Nebulization   Take 2.5 mg by nebulization every 6 (six) hours as needed for wheezing or shortness of breath. Reported on 07/09/2015         . amiodarone (PACERONE) 200 MG tablet   Oral   Take 1 tablet (200 mg total) by mouth daily.   90 tablet   1   . docusate sodium (COLACE) 100 MG capsule   Oral   Take 1 capsule (100 mg total) by mouth 2 (two) times daily.   10 capsule   0   . ferrous sulfate 325 (65 FE) MG tablet   Oral   Take 1 tablet (325 mg total) by mouth 2 (two) times daily with a meal.      3   . JANUVIA 100 MG tablet   Oral   Take 0.5 tablets (50 mg total) by mouth daily.   45 tablet   1     Dispense as written.   Marland Kitchen LORazepam (ATIVAN) 1 MG tablet   Oral   Take 1 tablet (1 mg total) by mouth at bedtime.   30 tablet   0   .  memantine (NAMENDA) 5 MG tablet   Oral   Take 5 mg by mouth 2 (two) times daily.         . metFORMIN (GLUCOPHAGE-XR) 500 MG 24 hr tablet   Oral   Take 1 tablet (500 mg total) by mouth daily before supper.   30 tablet   1   . metoprolol tartrate (LOPRESSOR) 25 MG tablet   Oral   Take 25 mg by mouth 2 (two) times daily.         . mometasone-formoterol (DULERA) 100-5 MCG/ACT AERO   Inhalation   Inhale  2 puffs into the lungs 2 (two) times daily.   13 g   5   . oxyCODONE (OXY IR/ROXICODONE) 5 MG immediate release tablet   Oral   Take 1 tablet (5 mg total) by mouth every 4 (four) hours as needed for severe pain ((for MODERATE breakthrough pain)).   30 tablet   0   . Probiotic Product (ALIGN) 4 MG CAPS   Oral   Take 1 capsule (4 mg total) by mouth daily.   90 capsule   1   . ranitidine (ZANTAC) 150 MG tablet   Oral   Take 1 tablet (150 mg total) by mouth 2 (two) times daily.   180 tablet   1   . rivaroxaban (XARELTO) 20 MG TABS tablet   Oral   Take 1 tablet (20 mg total) by mouth daily.   90 tablet   1   . senna (SENOKOT) 8.6 MG TABS tablet   Oral   Take 1 tablet by mouth 2 (two) times daily.         Marland Kitchen tiotropium (SPIRIVA) 18 MCG inhalation capsule   Inhalation   Place 1 capsule (18 mcg total) into inhaler and inhale daily.   90 capsule   1   . feeding supplement, ENSURE ENLIVE, (ENSURE ENLIVE) LIQD   Oral   Take 237 mLs by mouth 2 (two) times daily between meals.   237 mL   12     Allergies Codeine; Penicillins; Sulfa antibiotics; Doxycycline; Erythromycin; and Latex  Family History  Problem Relation Age of Onset  . Kidney disease Brother   . Heart disease Mother   . Hypothyroidism Mother   . Diabetes Mother   . Heart attack Mother   . Hypertension Mother   . Heart disease Father   . Heart attack Father   . Breast cancer Sister   . Hypothyroidism Sister   . Diabetes Sister   . Ovarian cancer Sister   . Diabetes Brother   . Prostate  cancer Brother   . Hypothyroidism Daughter     Social History Social History  Substance Use Topics  . Smoking status: Former Smoker -- 0.25 packs/day for 40 years    Types: Cigarettes    Quit date: 10/11/2011  . Smokeless tobacco: Never Used     Comment: Quit 3 years  . Alcohol Use: No    Review of Systems Constitutional: No fever/chills Eyes: No visual changes. ENT: No sore throat. Cardiovascular: Denies chest pain. Respiratory: Denies shortness of breath. Gastrointestinal: No abdominal pain.  No nausea, no vomiting.  No diarrhea.  No constipation. Genitourinary: Negative for dysuria. Musculoskeletal: Negative for back pain. Skin: Negative for rash. Neurological: Negative for headaches, focal weakness or numbness.  10-point ROS otherwise negative.  ____________________________________________   PHYSICAL EXAM:  VITAL SIGNS: ED Triage Vitals  Enc Vitals Group     BP 12/22/15 1134 117/48 mmHg     Pulse Rate 12/22/15 1134 67     Resp 12/22/15 1134 17     Temp 12/22/15 1134 98.3 F (36.8 C)     Temp Source 12/22/15 1134 Oral     SpO2 12/22/15 1134 98 %     Weight 12/22/15 1134 110 lb (49.896 kg)     Height 12/22/15 1134 5\' 4"  (1.626 m)     Head Cir --      Peak Flow --      Pain Score 12/22/15 1135 6     Pain Loc --  Pain Edu? --      Excl. in [place]? --     Constitutional: Patient sleepy but easily arousable and when aroused is Alert and oriented. Well appearing and in no acute distress. Eyes: Conjunctivae are normal. PERRL. EOMI. Head: Atraumatic.He reports his face is asymmetric which was new Nose: No congestion/rhinnorhea. Mouth/Throat: Mucous membranes Dry.  Oropharynx non-erythematous. Neck: No stridor.   Cardiovascular: Normal rate, regular rhythm. Grossly normal heart sounds.  Good peripheral circulation. Respiratory: Normal respiratory effort.  No retractions. Lungs CTAB. Gastrointestinal: Soft and nontender. No distention. No abdominal bruits. No  CVA tenderness. Musculoskeletal: No lower extremity tenderness nor edema.  No joint effusions. There is a decubitus ulcer on the heel of the right foot. Has lots of drainage 37 through the dressing onto the sheet. His foul-smelling as well Neurologic:  Normal speech and language. Patient diffusely weak but No gross focal neurologic deficits are appreciated. No gait instability. Skin:  Skin is warm, dry  No rash noted. Psychiatric: Mood and affect are normal. Speech and behavior are normal. Rectal: Soft fecal impaction ____________________________________________   LABS (all labs ordered are listed, but only abnormal results are displayed)  Labs Reviewed  CBC WITH DIFFERENTIAL/PLATELET - Abnormal; Notable for the following:    WBC 17.0 (*)    Hemoglobin 11.0 (*)    HCT 32.9 (*)    RDW 15.4 (*)    Neutro Abs 14.8 (*)    Lymphs Abs 0.7 (*)    Monocytes Absolute 1.4 (*)    All other components within normal limits  COMPREHENSIVE METABOLIC PANEL - Abnormal; Notable for the following:    Chloride 98 (*)    Glucose, Bld 283 (*)    Albumin 3.2 (*)    AST 53 (*)    All other components within normal limits  URINALYSIS COMPLETEWITH MICROSCOPIC ([facility] ONLY) - Abnormal; Notable for the following:    Color, Urine YELLOW (*)    APPearance CLEAR (*)    Glucose, UA >500 (*)    Ketones, ur TRACE (*)    Protein, ur 30 (*)    Squamous Epithelial / LPF 0-5 (*)    All other components within normal limits  SEDIMENTATION RATE - Abnormal; Notable for the following:    Sed Rate 76 (*)    All other components within normal limits  CULTURE, BLOOD (ROUTINE X 2)  CULTURE, BLOOD (ROUTINE X 2)  TROPONIN I   ____________________________________________  EKG  EKG read and interpreted by me shows normal sinus rhythm rate of 70 normal axis no acute ST-T wave changes ____________________________________________  RADIOLOGY  Chest x-ray read by radiology reviewed by me shows right middle lobe pneumonia  most likely aspiration Head CT read by radiology as no acute disease ____________________________________________   PROCEDURES  Swallowing test was done at the bedside by the nurse patient had a lot of difficulty with water she had trouble swallowing it and she began coughing afterwards. This is what leads me to suspect a pneumonia as aspiration that together with the fact that she has new facial asymmetry per her family insists leads me to suspect she's had a very recent stroke  Procedures    ____________________________________________   INITIAL IMPRESSION / ASSESSMENT AND PLAN / ED COURSE  Pertinent labs & imaging results that were available during my care of the patient were reviewed by me and considered in my medical decision making (see chart for details).   ____________________________________________   FINAL CLINICAL IMPRESSION(S) / ED DIAGNOSES  Final  diagnoses:  Cerebral infarction due to unspecified mechanism  Aspiration pneumonia of right lung, unspecified aspiration pneumonia type, unspecified part of lung (HCC)  Infected decubitus ulcer, unspecified pressure ulcer stage      NEW MEDICATIONS STARTED DURING THIS VISIT:  New Prescriptions   No medications on file     Note:  This document was prepared using Dragon voice recognition software and may include unintentional dictation errors.    Nena Polio, MD 12/22/15 [phone]

## 2015-12-22 NOTE — ED Notes (Signed)
Pt arrives here via [facility] from home  Pt has been at [facility] for rehab and physical therapy but has been home for "home therapy"  Spouse reported to EMS that he has noticed increased weakness/fatigue today  Pt had physical therapy yesterday and the husband questioned if that could have worn her out  Spouse requests spouse "to be checked out"  Pressure ulcer to right heel/ankle "ruptured" yesterday while she was in the shower

## 2015-12-22 NOTE — ED Notes (Signed)
Patient transported to X-ray then to CT 

## 2015-12-22 NOTE — ED Notes (Signed)
Nursing communications addressed. See chart.

## 2015-12-23 ENCOUNTER — Inpatient Hospital Stay (HOSPITAL_COMMUNITY)
Admit: 2015-12-23 | Discharge: 2015-12-23 | Disposition: A | Discharge status: Home / Self Care | Payer: Medicare Other | Attending: Internal Medicine | Admitting: Internal Medicine

## 2015-12-23 ENCOUNTER — Inpatient Hospital Stay: Payer: Medicare Other

## 2015-12-23 ENCOUNTER — Ambulatory Visit: Payer: Medicare Other | Admitting: General Surgery

## 2015-12-23 ENCOUNTER — Ambulatory Visit: Payer: Medicare Other | Admitting: Cardiovascular Disease

## 2015-12-23 DIAGNOSIS — A419 Sepsis, unspecified organism: Secondary | ICD-10-CM | POA: Diagnosis present

## 2015-12-23 DIAGNOSIS — R0603 Acute respiratory distress: Secondary | ICD-10-CM | POA: Diagnosis present

## 2015-12-23 DIAGNOSIS — I248 Other forms of acute ischemic heart disease: Secondary | ICD-10-CM

## 2015-12-23 DIAGNOSIS — R079 Chest pain, unspecified: Secondary | ICD-10-CM

## 2015-12-23 DIAGNOSIS — I5032 Chronic diastolic (congestive) heart failure: Secondary | ICD-10-CM

## 2015-12-23 DIAGNOSIS — L8990 Pressure ulcer of unspecified site, unspecified stage: Secondary | ICD-10-CM | POA: Diagnosis present

## 2015-12-23 DIAGNOSIS — R0789 Other chest pain: Secondary | ICD-10-CM

## 2015-12-23 DIAGNOSIS — G9341 Metabolic encephalopathy: Secondary | ICD-10-CM | POA: Diagnosis present

## 2015-12-23 DIAGNOSIS — I480 Paroxysmal atrial fibrillation: Secondary | ICD-10-CM

## 2015-12-23 LAB — BASIC METABOLIC PANEL
ANION GAP: 8 (ref 5–15)
BUN: 17 mg/dL (ref 6–20)
CO2: 26 mmol/L (ref 22–32)
Calcium: 8.7 mg/dL — ABNORMAL LOW (ref 8.9–10.3)
Chloride: 104 mmol/L (ref 101–111)
Creatinine, Ser: 0.61 mg/dL (ref 0.44–1.00)
GFR calc Af Amer: >60 mL/min (ref >60)
GLUCOSE: 195 mg/dL — AB (ref 65–99)
POTASSIUM: 3.5 mmol/L (ref 3.5–5.1)
SODIUM: 138 mmol/L (ref 135–145)

## 2015-12-23 LAB — TROPONIN I
Troponin I: 0.16 ng/mL (ref <0.03)
Troponin I: 0.1 ng/mL (ref <0.03)
Troponin I: 0.05 ng/mL (ref <0.03)
Troponin I: <0.03 ng/mL (ref <0.03)

## 2015-12-23 LAB — CBC
HEMATOCRIT: 31.6 % — AB (ref 35.0–47.0)
HEMOGLOBIN: 10.3 g/dL — AB (ref 12.0–16.0)
MCH: 28.9 pg (ref 26.0–34.0)
MCHC: 32.7 g/dL (ref 32.0–36.0)
MCV: 88.4 fL (ref 80.0–100.0)
Platelets: 214 10*3/uL (ref 150–440)
RBC: 3.57 MIL/uL — ABNORMAL LOW (ref 3.80–5.20)
RDW: 15.2 % — ABNORMAL HIGH (ref 11.5–14.5)
WBC: 17.7 10*3/uL — AB (ref 3.6–11.0)

## 2015-12-23 LAB — LIPID PANEL
Cholesterol: 125 mg/dL (ref 0–200)
HDL: 48 mg/dL (ref >40)
LDL CALC: 65 mg/dL (ref 0–99)
TRIGLYCERIDES: 59 mg/dL (ref <150)
Total CHOL/HDL Ratio: 2.6 RATIO
VLDL: 12 mg/dL (ref 0–40)

## 2015-12-23 LAB — HEMOGLOBIN A1C: HEMOGLOBIN A1C: 7.6 % — AB (ref 4.0–6.0)

## 2015-12-23 LAB — GLUCOSE, CAPILLARY
GLUCOSE-CAPILLARY: 231 mg/dL — AB (ref 65–99)
GLUCOSE-CAPILLARY: 259 mg/dL — AB (ref 65–99)
GLUCOSE-CAPILLARY: 256 mg/dL — AB (ref 65–99)
Glucose-Capillary: 276 mg/dL — ABNORMAL HIGH (ref 65–99)

## 2015-12-23 LAB — ECHOCARDIOGRAM COMPLETE
Height: 64 in
Weight: 1760 oz

## 2015-12-23 MED ORDER — ACETAMINOPHEN 650 MG RE SUPP: 650.0000 mg | Freq: Four times a day (QID) | RECTAL | Status: DC | PRN

## 2015-12-23 MED ORDER — SENNA 8.6 MG PO TABS
1.0000 | ORAL_TABLET | Freq: Two times a day (BID) | ORAL | Status: DC
  Administered 2015-12-24 – 2015-12-28 (×6): 8.6 mg via ORAL
  Filled 2015-12-23 (×7): qty 1

## 2015-12-23 MED ORDER — ONDANSETRON HCL 4 MG/2ML IJ SOLN
4.0000 mg | Freq: Four times a day (QID) | INTRAMUSCULAR | Status: DC | PRN
  Administered 2015-12-23: 4 mg via INTRAVENOUS
  Filled 2015-12-23: qty 2

## 2015-12-23 MED ORDER — NITROGLYCERIN 2 % TD OINT
0.5000 [in_us] | TOPICAL_OINTMENT | Freq: Four times a day (QID) | TRANSDERMAL | Status: DC
Start: 2015-12-23 — End: 2015-12-23
  Administered 2015-12-23: 0.5 [in_us] via TOPICAL
  Filled 2015-12-23: qty 30

## 2015-12-23 MED ORDER — INSULIN ASPART 100 UNIT/ML ~~LOC~~ SOLN
0.0000 [IU] | Freq: Three times a day (TID) | SUBCUTANEOUS | Status: DC
  Administered 2015-12-23: 8 [IU] via SUBCUTANEOUS
  Administered 2015-12-24: 3 [IU] via SUBCUTANEOUS
  Administered 2015-12-24: 5 [IU] via SUBCUTANEOUS
  Administered 2015-12-24: 11 [IU] via SUBCUTANEOUS
  Administered 2015-12-25: 2 [IU] via SUBCUTANEOUS
  Administered 2015-12-25: 3 [IU] via SUBCUTANEOUS
  Administered 2015-12-26: 8 [IU] via SUBCUTANEOUS
  Filled 2015-12-23: qty 2
  Filled 2015-12-23: qty 3
  Filled 2015-12-23: qty 5
  Filled 2015-12-23 (×2): qty 8
  Filled 2015-12-23: qty 11
  Filled 2015-12-23: qty 3

## 2015-12-23 MED ORDER — MORPHINE SULFATE (PF) 2 MG/ML IV SOLN
2.0000 mg | INTRAVENOUS | Status: DC | PRN
  Administered 2015-12-23 (×2): 2 mg via INTRAVENOUS
  Filled 2015-12-23 (×2): qty 1

## 2015-12-23 MED ORDER — ACETAMINOPHEN 325 MG PO TABS
650.0000 mg | ORAL_TABLET | Freq: Four times a day (QID) | ORAL | Status: DC | PRN
  Administered 2015-12-23 – 2015-12-24 (×2): 650 mg via ORAL
  Filled 2015-12-23 (×2): qty 2

## 2015-12-23 MED ORDER — GI COCKTAIL ~~LOC~~
30.0000 mL | Freq: Once | ORAL | Status: AC
  Administered 2015-12-23: 30 mL via ORAL
  Filled 2015-12-23: qty 30

## 2015-12-23 MED ORDER — DOCUSATE SODIUM 100 MG PO CAPS
100.0000 mg | ORAL_CAPSULE | Freq: Two times a day (BID) | ORAL | Status: DC
  Administered 2015-12-24 – 2015-12-28 (×6): 100 mg via ORAL
  Filled 2015-12-23 (×7): qty 1

## 2015-12-23 MED ORDER — SUCRALFATE 1 GM/10ML PO SUSP
1.0000 g | Freq: Three times a day (TID) | ORAL | Status: DC
  Administered 2015-12-23 – 2015-12-28 (×11): 1 g via ORAL
  Filled 2015-12-23 (×11): qty 10

## 2015-12-23 MED ORDER — RIVAROXABAN 15 MG PO TABS
15.0000 mg | ORAL_TABLET | Freq: Every day | ORAL | Status: DC
  Administered 2015-12-23 – 2015-12-25 (×3): 15 mg via ORAL
  Filled 2015-12-23 (×3): qty 1

## 2015-12-23 NOTE — Progress Notes (Signed)
D/c nitro paste order per Dr. Vianne Bulls

## 2015-12-23 NOTE — Progress Notes (Signed)
Patient Demographics  [name], is a 80 y.o. female   MRN: [id number]   DOB - November 16, 1935  Admit Date - 12/22/2015    Outpatient Primary MD for the patient is Halina Maidens, MD  Consult requested in the Hospital by Epifanio Lesches, MD, On 12/23/2015    Reason for consult decubitus ulcer right heel   With History of -  Past Medical History  Diagnosis Date  . DM (diabetes mellitus) ([place])   . Mitral valve prolapse     a. Not noted on 03/2015 Echo.  Marland Kitchen COPD (chronic obstructive pulmonary disease) ([place])   . Palpitations   . Osteoarthritis   . Systemic lupus erythematosus ([place])   . Lumbar spondylolysis   . Raynaud's disease   . Anxiety   . IBS (irritable bowel syndrome)   . Uveitis, anterior     ????  . Rheumatic fever   . Ankylosing spondylitis ([place])     ? how diagnosed  . Insomnia, unspecified   . Chronic peptic ulcer, unspecified site, without mention of hemorrhage, perforation, or obstruction   . History of kidney stones   . Chronic kidney infection   . Asthma     as child  . Hypothyroidism     mass, U/S 6/21 - nodules  . Motion sickness     car - back seat  . GERD (gastroesophageal reflux disease)   . Wears dentures     full upper, partial lower  . Wears contact lenses   . COPD (chronic obstructive pulmonary disease) ([place])     a.Uses 2l prn - followed by Dr. Raul Del.  . Raynaud disease   . Headache   . Neuropathy ([place])   . Wheezing   . Chronic diastolic CHF (congestive heart failure) ([place])     a. 03/2015 Echo: EF 55-60%, Gr 1 DD.  Marland Kitchen PAF (paroxysmal atrial fibrillation) ([place])     a. 03/2015 in setting of hip Fx->Amio/Xarelo;  b. CHA2DS2VASc= 4.  . Left displaced femoral neck fracture ([place])     a. 03/2015 s/p L hemiarthroplasty.  . Diabetes mellitus [facility])       Past Surgical  History  Procedure Laterality Date  . Esophagogastroduodenoscopy  multiple  . Colonoscopy  multiple  . Total abdominal hysterectomy w/ bilateral salpingoophorectomy  1990  . Breast biopsy    . Biopsy thyroid    . Esophagogastroduodenoscopy (egd) with propofol N/A 12/11/2014    Procedure: ESOPHAGOGASTRODUODENOSCOPY (EGD) WITH PROPOFOL;  Surgeon: [name], MD;  Location: [facility];  Service: Endoscopy;  Laterality: N/A;  cytology brushing  . Cataract extraction w/phaco Left 03/23/2015    Procedure: CATARACT EXTRACTION PHACO AND INTRAOCULAR LENS PLACEMENT (IOC);  Surgeon: Birder Robson, MD;  Location: [facility];  Service: Ophthalmology;  Laterality: Left;  [place] 00:51   . Hip arthroplasty Left 03/26/2015    Procedure: ARTHROPLASTY BIPOLAR HIP (HEMIARTHROPLASTY);  Surgeon: Earnestine Leys, MD;  Location: [facility];  Service: Orthopedics;  Laterality: Left;  . Cataract extraction w/phaco Right 05/25/2015    Procedure: CATARACT EXTRACTION PHACO AND INTRAOCULAR LENS PLACEMENT (IOC);  Surgeon: Birder Robson, MD;  Location: [facility];  Service: Ophthalmology;  Laterality: Right;  [place] 00:57   .  Joint replacement Left   . Hip arthroplasty Right 11/06/2015    Procedure: ARTHROPLASTY BIPOLAR HIP (HEMIARTHROPLASTY);  Surgeon: Thornton Park, MD;  Location: [facility];  Service: Orthopedics;  Laterality: Right;    in for   Chief Complaint  Patient presents with  . Fatigue  . Pressure Ulcer     HPI  [name]  is a 80 y.o. female, She's had treatment for this ulcer at the wound care center in [place] [facility] for a number of months. She's been using some Santyl ointment and they've been putting a padded occlusive dressing on. Husband states that when I use that there is excessive drainage built up around the heels often puts a gauze pad on it instead so drain out. He states it does better when this is utilized.    Review of Systems : Patient is responsive to questions but has some  Alzheimer's changes. Has had hip fractures bilaterally. A large dermis systems will be listed below. In additi Social History Social History  Substance Use Topics  . Smoking status: Former Smoker -- 0.25 packs/day for 40 years    Types: Cigarettes    Quit date: 10/11/2011  . Smokeless tobacco: Never Used     Comment: Quit 3 years  . Alcohol Use: No     Family History Family History  Problem Relation Age of Onset  . Kidney disease Brother   . Heart disease Mother   . Hypothyroidism Mother   . Diabetes Mother   . Heart attack Mother   . Hypertension Mother   . Heart disease Father   . Heart attack Father   . Breast cancer Sister   . Hypothyroidism Sister   . Diabetes Sister   . Ovarian cancer Sister   . Diabetes Brother   . Prostate cancer Brother   . Hypothyroidism Daughter      Prior to Admission medications   Medication Sig Start Date End Date Taking? Authorizing Provider  albuterol (PROVENTIL HFA;VENTOLIN HFA) 108 (90 Base) MCG/ACT inhaler Inhale 2 puffs into the lungs 4 (four) times daily as needed for wheezing or shortness of breath.   Yes Historical Provider, MD  albuterol (PROVENTIL) (2.5 MG/3ML) 0.083% nebulizer solution Take 2.5 mg by nebulization every 6 (six) hours as needed for wheezing or shortness of breath. Reported on 07/09/2015   Yes Historical Provider, MD  amiodarone (PACERONE) 200 MG tablet Take 1 tablet (200 mg total) by mouth daily. 06/22/15  Yes Glean Hess, MD  docusate sodium (COLACE) 100 MG capsule Take 1 capsule (100 mg total) by mouth 2 (two) times daily. 11/10/15  Yes Srikar Sudini, MD  ferrous sulfate 325 (65 FE) MG tablet Take 1 tablet (325 mg total) by mouth 2 (two) times daily with a meal. 11/10/15  Yes Srikar Sudini, MD  JANUVIA 100 MG tablet Take 0.5 tablets (50 mg total) by mouth daily. 06/22/15  Yes Glean Hess, MD  LORazepam (ATIVAN) 1 MG tablet Take 1 tablet (1 mg total) by mouth at bedtime. 11/10/15  Yes Srikar Sudini, MD   memantine (NAMENDA) 5 MG tablet Take 5 mg by mouth 2 (two) times daily.   Yes Historical Provider, MD  metFORMIN (GLUCOPHAGE-XR) 500 MG 24 hr tablet Take 1 tablet (500 mg total) by mouth daily before supper. 12/20/15  Yes Glean Hess, MD  metoprolol tartrate (LOPRESSOR) 25 MG tablet Take 25 mg by mouth 2 (two) times daily.   Yes Historical Provider, MD  mometasone-formoterol (DULERA) 100-5 MCG/ACT AERO Inhale  2 puffs into the lungs 2 (two) times daily. 07/16/15  Yes Glean Hess, MD  oxyCODONE (OXY IR/ROXICODONE) 5 MG immediate release tablet Take 1 tablet (5 mg total) by mouth every 4 (four) hours as needed for severe pain ((for MODERATE breakthrough pain)). 11/10/15  Yes Srikar Sudini, MD  Probiotic Product (ALIGN) 4 MG CAPS Take 1 capsule (4 mg total) by mouth daily. 06/22/15  Yes Glean Hess, MD  ranitidine (ZANTAC) 150 MG tablet Take 1 tablet (150 mg total) by mouth 2 (two) times daily. 06/22/15  Yes Glean Hess, MD  rivaroxaban (XARELTO) 20 MG TABS tablet Take 1 tablet (20 mg total) by mouth daily. 06/22/15  Yes Glean Hess, MD  senna (SENOKOT) 8.6 MG TABS tablet Take 1 tablet by mouth 2 (two) times daily.   Yes Historical Provider, MD  tiotropium (SPIRIVA) 18 MCG inhalation capsule Place 1 capsule (18 mcg total) into inhaler and inhale daily. 06/22/15  Yes Glean Hess, MD  feeding supplement, ENSURE ENLIVE, (ENSURE ENLIVE) LIQD Take 237 mLs by mouth 2 (two) times daily between meals. 11/10/15   Hillary Bow, MD    Anti-infectives    Start     Dose/Rate Route Frequency Ordered Stop   12/22/15 1615  clindamycin (CLEOCIN) IVPB 600 mg     600 mg 100 mL/hr over 30 Minutes Intravenous Every 8 hours 12/22/15 1601        Scheduled Meds: . amiodarone  200 mg Oral Daily  . clindamycin (CLEOCIN) IV  600 mg Intravenous Q8H  . docusate sodium  100 mg Oral BID  . famotidine (PEPCID) IV  20 mg Intravenous Q12H  . insulin aspart  0-15 Units Subcutaneous TID WC  . insulin  aspart  0-5 Units Subcutaneous QHS  . metoprolol tartrate  25 mg Oral BID  . mometasone-formoterol  2 puff Inhalation BID  . rivaroxaban  15 mg Oral Q supper  . senna  1 tablet Oral BID  . sodium chloride flush  3 mL Intravenous Q12H  . sucralfate  1 g Oral TID WC  . tiotropium  18 mcg Inhalation Daily   Continuous Infusions: . sodium chloride 50 mL/hr at 12/22/15 2135   PRN Meds:.acetaminophen **OR** acetaminophen, albuterol, morphine injection  Allergies  Allergen Reactions  . Codeine Nausea And Vomiting  . Penicillins Hives    Has patient had a PCN reaction causing immediate rash, facial/tongue/throat swelling, SOB or lightheadedness with hypotension: no  Has patient had a PCN reaction causing severe rash involving mucus membranes or skin necrosis: no  Has patient had a PCN reaction that required hospitalization: no  Has patient had a PCN reaction occurring within the last 10 years: no  If all of the above answers are "NO", then may proceed with Cephalosporin use.   . Sulfa Antibiotics   . Doxycycline Rash  . Erythromycin Rash  . Latex Rash    Physical Exam  Vitals  Blood pressure 153/61, pulse 73, temperature 99 F (37.2 C), temperature source Oral, resp. rate 18, height _0  (1.626 m), weight 49.896 kg (110 lb), SpO2 97 %.  Lower Extremity exam:  Vascular:Patient does have a palpable pulse on both feet. I did ask fascial take a look because of the slow lack of healing she may have more circulation to the posterior tibial artery and this may affect the healing of the heel.  Dermatological: patient is ulceration on the posterior heel approximately 2.5 x 2 cm. There is a large area of  granulation was some centralized areas of necrosis in the central portion of the wound there is no dry eschars more of a moist eschar on the posterior aspect but considerable granulation is noted however.  Neurological: patient is diabetic but still has some sensation. However I clean the  wound she did not flinch or complain of any pain.  Ortho: no gross deformities. Achilles tendons are intact. Wound does not seem to penetrate down to a deeper level. Does not appear to be any count of bone involvement or Achilles tendon involvement.  Data Review  CBC  Recent Labs Lab 12/22/15 1203 12/23/15 0507  WBC 17.0* 17.7*  HGB 11.0* 10.3*  HCT 32.9* 31.6*  PLT 232 214  MCV 86.5 88.4  MCH 28.8 28.9  MCHC 33.3 32.7  RDW 15.4* 15.2*  LYMPHSABS 0.7*  --   MONOABS 1.4*  --   EOSABS 0.0  --   BASOSABS 0.1  --    ------------------------------------------------------------------------------------------------------------------  Chemistries   Recent Labs Lab 12/22/15 1203 12/23/15 0507  NA 135 138  K 4.0 3.5  CL 98* 104  CO2 30 26  GLUCOSE 283* 195*  BUN 15 17  CREATININE 0.73 0.61  CALCIUM 9.0 8.7*  AST 53*  --   ALT 34  --   ALKPHOS 103  --   BILITOT 0.6  --    ------------------------------------------------------------------------------------------------------------------ estimated creatinine clearance is 46.4 mL/min (by C-G formula based on Cr of 0.61). ------------------------------------------------------------------------------------------------------------------ No results for input(s): TSH, T4TOTAL, T3FREE, THYROIDAB in the last 72 hours.  Invalid input(s): FREET3   Coagulation profile No results for input(s): INR, PROTIME in the last 168 hours. ------------------------------------------------------------------------------------------------------------------- No results for input(s): DDIMER in the last 72 hours. -------------------------------------------------------------------------------------------------------------------  Cardiac Enzymes  Recent Labs Lab 12/23/15 0507 12/23/15 0734 12/23/15 1310  TROPONINI 0.16* 0.10* 0.05*    ------------------------------------------------------------------------------------------------------------------ Invalid input(s): POCBNP   ---------------------------------------------------------------------------------------------------------------  Urinalysis    Component Value Date/Time   COLORURINE YELLOW* 12/22/2015 1301   COLORURINE Yellow 07/16/2013 1208   APPEARANCEUR CLEAR* 12/22/2015 1301   APPEARANCEUR Hazy* 08/18/2015 1403   APPEARANCEUR Hazy 07/16/2013 1208   LABSPEC 1.022 12/22/2015 1301   LABSPEC 1.028 07/16/2013 1208   PHURINE 6.0 12/22/2015 1301   PHURINE 6.0 07/16/2013 1208   GLUCOSEU >500* 12/22/2015 1301   GLUCOSEU >=500 07/16/2013 1208   HGBUR NEGATIVE 12/22/2015 1301   HGBUR Negative 07/16/2013 1208   BILIRUBINUR NEGATIVE 12/22/2015 1301   BILIRUBINUR Negative 08/18/2015 1403   BILIRUBINUR Negative 07/16/2013 1208   KETONESUR TRACE* 12/22/2015 1301   KETONESUR Trace 07/16/2013 1208   PROTEINUR 30* 12/22/2015 1301   PROTEINUR 1+* 08/18/2015 1403   PROTEINUR 30 mg/dL 07/16/2013 1208   NITRITE NEGATIVE 12/22/2015 1301   NITRITE Negative 08/18/2015 1403   NITRITE Negative 07/16/2013 1208   LEUKOCYTESUR NEGATIVE 12/22/2015 1301   LEUKOCYTESUR Negative 08/18/2015 1403   LEUKOCYTESUR 3+ 07/16/2013 1208     Imaging results:   Dg Chest 2 View  12/22/2015  CLINICAL DATA:  80 year old female with increased weakness and fatigue. EXAM: CHEST  2 VIEW COMPARISON:  11/09/2015 radiographs FINDINGS: The cardiomediastinal silhouette is unremarkable. Inferior right upper lobe airspace opacity likely represents pneumonia. There is no evidence of pulmonary edema, pneumothorax, mass or pleural effusion. No acute bony abnormalities are identified. IMPRESSION: Right upper lobe airspace opacity compatible with pneumonia. Radiographic follow-up to resolution is recommended. Electronically Signed   By: Margarette Canada M.D.   On: 12/22/2015 14:11   Dg Pelvis 1-2  Views  12/22/2015  CLINICAL  DATA:  Pain following fall EXAM: PELVIS - 1-2 VIEW COMPARISON:  None. FINDINGS: There is no evidence of pelvic fracture or diastases. There are total hip replacements bilaterally with the prosthetic components appearing well-seated bilaterally. There is mild osteoarthritic change in both sacroiliac joints. There is calcification inferior to the right ischium, likely of chronic inflammatory etiology. IMPRESSION: Status post total hip replacements bilaterally with prosthetic components appearing well-seated. No acute fracture or dislocation. Mild osteoarthritic change in both sacroiliac joints. Electronically Signed   By: Lowella Grip III M.D.   On: 12/22/2015 14:12   Ct Head Wo Contrast  12/22/2015  CLINICAL DATA:  80 year old female with increased weakness and fatigue and altered mental status. EXAM: CT HEAD WITHOUT CONTRAST TECHNIQUE: Contiguous axial images were obtained from the base of the skull through the vertex without intravenous contrast. COMPARISON:  07/01/2015 CT and MRI FINDINGS: Mild cerebral atrophy and moderate chronic small-vessel white matter ischemic changes again noted. No acute intracranial abnormalities are identified, including mass lesion or mass effect, hydrocephalus, extra-axial fluid collection, midline shift, hemorrhage, or acute infarction. The visualized bony calvarium is unremarkable. IMPRESSION: No evidence of acute intracranial abnormality. Atrophy and chronic small-vessel white matter ischemic changes. Electronically Signed   By: Margarette Canada M.D.   On: 12/22/2015 14:24   Mr Brain Wo Contrast  12/23/2015  CLINICAL DATA:  Altered mental status and somnolence.  Incontinence. EXAM: MRI HEAD WITHOUT CONTRAST TECHNIQUE: Multiplanar, multiecho pulse sequences of the brain and surrounding structures were obtained without intravenous contrast. COMPARISON:  CT 12/22/2015.  MRI 07/01/2015. FINDINGS: Diffusion imaging does not show any acute or subacute  infarction. There chronic small-vessel ischemic changes throughout the pons. No cerebellar insult. Cerebral hemispheres show atrophy and chronic small vessel ischemic change throughout the deep and subcortical white matter. No cortical or large vessel territory infarction. No intra-axial mass lesion, hemorrhage, hydrocephalus or subdural collection. Small left frontal arachnoid cyst is unchanged and not significant. No pituitary mass. No inflammatory sinus disease. IMPRESSION: No acute or reversible finding. Atrophy and chronic small-vessel ischemic changes throughout the brain. No change in an insignificant left frontal arachnoid cyst. Electronically Signed   By: Nelson Chimes M.D.   On: 12/23/2015 13:14   Dg Chest Port 1 View  12/23/2015  CLINICAL DATA:  Follow-up right upper lobe infiltrate EXAM: PORTABLE CHEST 1 VIEW COMPARISON:  12/22/2015 FINDINGS: Cardiac shadow is stable. The lungs are well aerated bilaterally. Persistent right upper lobe infiltrate is noted. No significant interval change is seen. No new focal infiltrate is noted no bony abnormality is seen. IMPRESSION: Stable right upper lobe infiltrate. Electronically Signed   By: Inez Catalina M.D.   On: 12/23/2015 07:10   Dg Foot Complete Right  12/22/2015  CLINICAL DATA:  [place] 1-2 weeks ago, RIGHT foot ulcers which are bandaged EXAM: RIGHT FOOT COMPLETE - 3+ VIEW COMPARISON:  12/06/2015 FINDINGS: Dressing artifacts hindfoot. Osseous demineralization. Joint spaces preserved. No acute fracture, dislocation or bone destruction. IMPRESSION: No acute osseous abnormalities. Electronically Signed   By: Lavonia Dana M.D.   On: 12/22/2015 14:10    Assessment & Plan: Patient has a myriad of medical issues going on right now the least of which is been a recent bout of pneumonia. Heel decubitus ulcer on the right heel for several months. Overall appears to be fairly stable and I do not think I'll need to take her to the operating room to clean this up. Plan:  I will clean this sometime tomorrow with removal of some  of the superficial necrotic moist tissue centrally but overall she does have good granular tissue. The difficulty with her's keeping all the pressure off the heel because no matter what technique is utilized roots suspension extra padding she usually shifts or moves her feet so that the heels are not protective as much. Needs a heavy padded dressing as well. Start wet-to-dry dressing to the heel with specific dressing materials to include Kerlix abdominal pads and Kerlix wrap. The husband and daughter states that she will not tolerate any heel protector boots. In light of this I recommend continuous suspension of both heels off the bed or chair. She is scheduled to have a catheterization lower extremity tomorrow.   Principal Problem:   Acute respiratory distress (HCC) Active Problems:   Chronic diastolic CHF (congestive heart failure) (HCC)   Paroxysmal atrial fibrillation (HCC)   Pneumonia   Pressure ulcer   Demand ischemia (HCC)   Metabolic encephalopathy   Sepsis ([place])   Family Communication: Plan discussed with patient and family   Perry Mount M.D on 12/23/2015 at 5:33 PM  Thank you for the consult, we will follow the patient with you in the Hospital.

## 2015-12-23 NOTE — Progress Notes (Signed)
Dr Vianne Bulls called back, notified of troponin 0.16. MD will place orders.

## 2015-12-23 NOTE — Progress Notes (Signed)
Patient complaining of nausea while trying to eat dinner, requesting medication and jello instead of her dinner. Dr. Vianne Bulls notified, order for IV zofran 4mg  q6h PRN.

## 2015-12-23 NOTE — Progress Notes (Signed)
Patient had incontinent episode at some point this morning, brief was wet. Went ahead and bladder scanned her since night shift had to do an in &out cath. Bladder scan was 241mL. Notified Dr. Vianne Bulls. No interventions at this time. Instructed to bladder scan again in a few hours if patient does not void again. Order to in & out cath if bladder scan >334mL.

## 2015-12-23 NOTE — Progress Notes (Signed)
Dr. Vianne Bulls rounding - aware of right sided chest pain. Likely due to PNA per MD. Patient tolerating water - order for heart healthy diabetic diet.

## 2015-12-23 NOTE — Progress Notes (Signed)
[facility] NAME: [name]    MR#:  [id number]  DATE OF BIRTH:  [birth year]  SUBJECTIVE: Admitted for pneumonia, pressure ulcer.  C/o of right-sided chest pain more with deep breath. Npo because she failed swallowing evaluation in the emergency room. No cough,no sob,very weak overall. when she came she was unresponsive but now she is very alert and oriented.   CHIEF COMPLAINT:   Chief Complaint  Patient presents with  . Fatigue  . Pressure Ulcer    REVIEW OF SYSTEMS:   ROS CONSTITUTIONAL: No fever,  But has fatigue and generalized weakness.  EYES: No blurred or double vision.  EARS, NOSE, AND THROAT: No tinnitus or ear pain.  RESPIRATORY: No cough, shortness of breath, wheezing or hemoptysis.  CARDIOVASCULAR: No chest pain, orthopnea, edema.  GASTROINTESTINAL: No nausea, vomiting, diarrhea or abdominal pain.  GENITOURINARY: No dysuria, hematuria.  ENDOCRINE: No polyuria, nocturia,  HEMATOLOGY: No anemia, easy bruising or bleeding SKIN: No rash or lesion. MUSCULOSKELETAL:right heel decubiti with some drainage,  NEUROLOGIC: No tingling, numbness, weakness.  PSYCHIATRY: No anxiety or depression.   DRUG ALLERGIES:   Allergies  Allergen Reactions  . Codeine Nausea And Vomiting  . Penicillins Hives    Has patient had a PCN reaction causing immediate rash, facial/tongue/throat swelling, SOB or lightheadedness with hypotension: no  Has patient had a PCN reaction causing severe rash involving mucus membranes or skin necrosis: no  Has patient had a PCN reaction that required hospitalization: no  Has patient had a PCN reaction occurring within the last 10 years: no  If all of the above answers are "NO", then may proceed with Cephalosporin use.   . Sulfa Antibiotics   . Doxycycline Rash  . Erythromycin Rash  . Latex Rash    VITALS:  Blood pressure 136/47, pulse 87, temperature 98.1 F (36.7 C), temperature  source Oral, resp. rate 18, height 5\' 4"  (1.626 m), weight 49.896 kg (110 lb), SpO2 98 %.  PHYSICAL EXAMINATION:  GENERAL:  80 y.o.-year-old patient lying in the bed with no acute distress. Appears cachectic, EYES: Pupils equal, round, reactive to light and accommodation. No scleral icterus. Extraocular muscles intact.  HEENT: Head atraumatic, normocephalic. Oropharynx and nasopharynx clear.  NECK:  Supple, no jugular venous distention. No thyroid enlargement, no tenderness.  LUNGS: Normal breath sounds bilaterally, no wheezing, rales,rhonchi or crepitation. No use of accessory muscles of respiration.  CARDIOVASCULAR: S1, S2 normal. No murmurs, rubs, or gallops.  ABDOMEN: Soft, nontender, nondistended. Bowel sounds present. No organomegaly or mass.  EXTREMITIES: No pedal edema, cyanosis, or clubbing.  NEUROLOGIC: Cranial nerves II through XII are intact. Muscle strength 5/5 in all extremities. Sensation intact. Gait not checked.  PSYCHIATRIC: The patient is alert and oriented x 3.  SKIN: No obvious rash, lesion, or ulcer.    LABORATORY PANEL:   CBC  Recent Labs Lab 12/23/15 0507  WBC 17.7*  HGB 10.3*  HCT 31.6*  PLT 214   ------------------------------------------------------------------------------------------------------------------  Chemistries   Recent Labs Lab 12/22/15 1203 12/23/15 0507  NA 135 138  K 4.0 3.5  CL 98* 104  CO2 30 26  GLUCOSE 283* 195*  BUN 15 17  CREATININE 0.73 0.61  CALCIUM 9.0 8.7*  AST 53*  --   ALT 34  --   ALKPHOS 103  --   BILITOT 0.6  --    ------------------------------------------------------------------------------------------------------------------  Cardiac Enzymes  Recent Labs Lab 12/23/15 0507  TROPONINI 0.16*   ------------------------------------------------------------------------------------------------------------------  RADIOLOGY:  Dg Chest 2 View  12/22/2015  CLINICAL DATA:  80 year old female with increased  weakness and fatigue. EXAM: CHEST  2 VIEW COMPARISON:  11/09/2015 radiographs FINDINGS: The cardiomediastinal silhouette is unremarkable. Inferior right upper lobe airspace opacity likely represents pneumonia. There is no evidence of pulmonary edema, pneumothorax, mass or pleural effusion. No acute bony abnormalities are identified. IMPRESSION: Right upper lobe airspace opacity compatible with pneumonia. Radiographic follow-up to resolution is recommended. Electronically Signed   By: Margarette Canada M.D.   On: 12/22/2015 14:11   Dg Pelvis 1-2 Views  12/22/2015  CLINICAL DATA:  Pain following fall EXAM: PELVIS - 1-2 VIEW COMPARISON:  None. FINDINGS: There is no evidence of pelvic fracture or diastases. There are total hip replacements bilaterally with the prosthetic components appearing well-seated bilaterally. There is mild osteoarthritic change in both sacroiliac joints. There is calcification inferior to the right ischium, likely of chronic inflammatory etiology. IMPRESSION: Status post total hip replacements bilaterally with prosthetic components appearing well-seated. No acute fracture or dislocation. Mild osteoarthritic change in both sacroiliac joints. Electronically Signed   By: Lowella Grip III M.D.   On: 12/22/2015 14:12   Ct Head Wo Contrast  12/22/2015  CLINICAL DATA:  80 year old female with increased weakness and fatigue and altered mental status. EXAM: CT HEAD WITHOUT CONTRAST TECHNIQUE: Contiguous axial images were obtained from the base of the skull through the vertex without intravenous contrast. COMPARISON:  07/01/2015 CT and MRI FINDINGS: Mild cerebral atrophy and moderate chronic small-vessel white matter ischemic changes again noted. No acute intracranial abnormalities are identified, including mass lesion or mass effect, hydrocephalus, extra-axial fluid collection, midline shift, hemorrhage, or acute infarction. The visualized bony calvarium is unremarkable. IMPRESSION: No evidence of  acute intracranial abnormality. Atrophy and chronic small-vessel white matter ischemic changes. Electronically Signed   By: Margarette Canada M.D.   On: 12/22/2015 14:24   Dg Chest Port 1 View  12/23/2015  CLINICAL DATA:  Follow-up right upper lobe infiltrate EXAM: PORTABLE CHEST 1 VIEW COMPARISON:  12/22/2015 FINDINGS: Cardiac shadow is stable. The lungs are well aerated bilaterally. Persistent right upper lobe infiltrate is noted. No significant interval change is seen. No new focal infiltrate is noted no bony abnormality is seen. IMPRESSION: Stable right upper lobe infiltrate. Electronically Signed   By: Inez Catalina M.D.   On: 12/23/2015 07:10   Dg Foot Complete Right  12/22/2015  CLINICAL DATA:  [place] 1-2 weeks ago, RIGHT foot ulcers which are bandaged EXAM: RIGHT FOOT COMPLETE - 3+ VIEW COMPARISON:  12/06/2015 FINDINGS: Dressing artifacts hindfoot. Osseous demineralization. Joint spaces preserved. No acute fracture, dislocation or bone destruction. IMPRESSION: No acute osseous abnormalities. Electronically Signed   By: Lavonia Dana M.D.   On: 12/22/2015 14:10    EKG:   Orders placed or performed during the hospital encounter of 12/22/15  . EKG 12-Lead  . EKG 12-Lead  . ED EKG  . ED EKG  . EKG 12-Lead  . EKG 12-Lead    ASSESSMENT AND PLAN:  1, metabolic encephalopathy secondary to pneumonia: Improving, she is alert, awake, oriented. Nothing by mouth because she failed swallow study in the emergency room.  needs to be seen by speech therapy. Think she can be started back on the diet but family is requesting speech  therapy evaluation before we start the diet. 2. Right-sided pleuritic chest pain secondary to pneumonia. Slightly elevated troponins: Patient has history of proximal atrial fibrillation. Regular troponins, continue Xarelto that she was taking, add nitrates, continue  the blockers. Echo ordered. EKG  Showed no ST_T changes.. #3. History of GERD continue PPIs, Carafate. #4 generalized  weakness physical therapy consult to evaluation for SNIF placement #5. Right heel pressure ulcer: With some drainage: podiatry consult. 6. Suspected; Left facial droop on admission;none now;MRI brain ordered,continue NPO.no focal neuro deficit.just weak overall, 7,  Paroxysmal atrial fibrillation: Patient is on Xarelto,  amiodarone, metoprolol. History of COPD: Patient follows up with Dr. Raul Del, no wheezing at this time. Continue Spiriva, nebulizers. History of lupus,; Raynard's #8. diabetes mellitus type 2: Patient is very malnourished: Will start  Her on low-sodium diet only.  All the records are reviewed and case discussed with Care Management/Social Workerr. Management plans discussed with the patient, family and they are in agreement.  CODE STATUS: DNR  TOTAL TIME TAKING CARE OF THIS PATIENT: 34minutes.   POSSIBLE D/C IN 1-2 DAYS, DEPENDING ON CLINICAL CONDITION.   Epifanio Lesches M.D on 12/23/2015 at 8:32 AM  Between 7am to 6pm - Pager - [phone]  After 6pm go to [email or web address] - password [email or web address] [facility]  Office  [phone]  CC: Primary care physician; Halina Maidens, MD   Note: This dictation was prepared with Dragon dictation along with smaller phrase technology. Any transcriptional errors that result from this process are unintentional.

## 2015-12-23 NOTE — Progress Notes (Signed)
Critical Value Troponin 0.16. Dr Vianne Bulls paged.

## 2015-12-23 NOTE — NC FL2 (Signed)
[place] LEVEL OF CARE SCREENING TOOL     IDENTIFICATION  Patient Name: [name] Birthdate: 06-09-1936 Sex: female Admission Date (Current Location): 12/22/2015  [place] and [place] Number:  Engineering geologist and Address:  [facility], [address] [place], [place] [address]      Provider Number: [id number]  Attending Physician Name and Address:  Epifanio Lesches, MD  Relative Name and Phone Number:       Current Level of Care: Hospital Recommended Level of Care: [place] Prior Approval Number:    Date Approved/Denied:   PASRR Number:  ([id number])  Discharge Plan: SNF    Current Diagnoses: Patient Active Problem List   Diagnosis Date Noted  . Pressure ulcer 12/23/2015  . Acute respiratory distress (HCC) 12/23/2015  . Demand ischemia ([place]) 12/23/2015  . Metabolic encephalopathy [id number]  . Sepsis ([place]) 12/23/2015  . Pneumonia 12/22/2015  . Hip fracture ([place]) 11/04/2015  . Chronic respiratory failure with hypoxia ([place]) 10/27/2015  . Mixed Alzheimer's and vascular dementia 07/29/2015  . Difficulty in walking 07/29/2015  . Type 2 diabetes mellitus with stage 1 chronic kidney disease, without long-term current use of insulin ([place]) 07/16/2015  . Tremor observed on examination 07/16/2015  . Urinary retention 07/12/2015  . Pressure ulcer of left heel 07/12/2015  . Malnutrition of moderate degree 07/03/2015  . Multi-infarct dementia 07/03/2015  . 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 Paroxysmal atrial fibrillation ([place]) 06/25/2015  . Chronic diastolic CHF (congestive heart failure) ([place])   . Left displaced femoral neck fracture ([place])   . Fall   . GERD (gastroesophageal reflux disease) 03/26/2015  . Anxiety 03/26/2015  . Gastroduodenal ulcer 01/05/2015  . Difficulty swallowing solids   . Esophageal candidiasis ([place])   . Acquired hypothyroidism 08/18/2014  . Systemic lupus erythematosus  ([place]) 04/03/2014  . Severe chronic obstructive pulmonary disease ([place]) 10/28/2013    Orientation RESPIRATION BLADDER Height & Weight     Self  O2 (1 Liter Oxygen ) Continent Weight: 110 lb (49.896 kg) Height:  5\' 4"  (162.6 cm)  BEHAVIORAL SYMPTOMS/MOOD NEUROLOGICAL BOWEL NUTRITION STATUS   (none )  (none ) Continent Diet (Diet: DYS 3 )  AMBULATORY STATUS COMMUNICATION OF NEEDS Skin   Extensive Assist Verbally PU Stage and Appropriate Care (Pressure Ulcer Stage 2: Right Ankle. Pressure Ulcer Stage 3: Right Heel. )                       Personal Care Assistance Level of Assistance  Bathing, Feeding, Dressing Bathing Assistance: Limited assistance Feeding assistance: Independent Dressing Assistance: Limited assistance     Functional Limitations Info  Sight, Hearing, Speech Sight Info: Adequate Hearing Info: Adequate Speech Info: Adequate    SPECIAL CARE FACTORS FREQUENCY  PT (By licensed PT), OT (By licensed OT)     PT Frequency:  (5) OT Frequency:  (5)            Contractures      Additional Factors Info  Code Status, Allergies, Insulin Sliding Scale Code Status Info:  (DNR ) Allergies Info:  (Codeine, Penicillins, Sulfa Antibiotics, Doxycycline, Erythromycin, Latex)   Insulin Sliding Scale Info:  (NovoLog Insulin Injections 3 times per day. )       Current Medications (12/23/2015):  This is the current hospital active medication list Current Facility-Administered Medications  Medication Dose Route Frequency Provider Last Rate Last Dose  . 0.9 %  sodium chloride infusion   Intravenous  Continuous Loletha Grayer, MD 50 mL/hr at 12/22/15 2135    . acetaminophen (TYLENOL) tablet 650 mg  650 mg Oral Q6H PRN Epifanio Lesches, MD   650 mg at 12/23/15 1612   Or  . acetaminophen (TYLENOL) suppository 650 mg  650 mg Rectal Q6H PRN Epifanio Lesches, MD      . albuterol (PROVENTIL) (2.5 MG/3ML) 0.083% nebulizer solution 2.5 mg  2.5 mg Nebulization Q6H PRN Loletha Grayer, MD   2.5 mg at 12/23/15 1532  . amiodarone (PACERONE) tablet 200 mg  200 mg Oral Daily Loletha Grayer, MD   200 mg at 12/23/15 1028  . clindamycin (CLEOCIN) IVPB 600 mg  600 mg Intravenous Q8H Nena Polio, MD   600 mg at 12/23/15 1425  . docusate sodium (COLACE) capsule 100 mg  100 mg Oral BID Epifanio Lesches, MD   100 mg at 12/23/15 1404  . famotidine (PEPCID) IVPB 20 mg premix  20 mg Intravenous Q12H Loletha Grayer, MD   20 mg at 12/23/15 1028  . insulin aspart (novoLOG) injection 0-15 Units  0-15 Units Subcutaneous TID WC Epifanio Lesches, MD   8 Units at 12/23/15 1624  . insulin aspart (novoLOG) injection 0-5 Units  0-5 Units Subcutaneous QHS Loletha Grayer, MD   0 Units at 12/22/15 2130  . metoprolol tartrate (LOPRESSOR) tablet 25 mg  25 mg Oral BID Loletha Grayer, MD   25 mg at 12/23/15 1028  . mometasone-formoterol (DULERA) 100-5 MCG/ACT inhaler 2 puff  2 puff Inhalation BID Loletha Grayer, MD   2 puff at 12/22/15 2130  . morphine 2 MG/ML injection 2 mg  2 mg Intravenous Q4H PRN Harrie Foreman, MD   2 mg at 12/23/15 1028  . Rivaroxaban (XARELTO) tablet 15 mg  15 mg Oral Q supper Loletha Grayer, MD   15 mg at 12/23/15 1625  . senna (SENOKOT) tablet 8.6 mg  1 tablet Oral BID Epifanio Lesches, MD      . sodium chloride flush (NS) 0.9 % injection 3 mL  3 mL Intravenous Q12H Loletha Grayer, MD   3 mL at 12/22/15 2135  . sucralfate (CARAFATE) 1 GM/10ML suspension 1 g  1 g Oral TID WC Epifanio Lesches, MD   1 g at 12/23/15 1625  . tiotropium (SPIRIVA) inhalation capsule 18 mcg  18 mcg Inhalation Daily Loletha Grayer, MD   18 mcg at 12/22/15 1805     Discharge Medications: Please see discharge summary for a list of discharge medications.  Relevant Imaging Results:  Relevant Lab Results:   Additional Information  (SSN: [id number])  Annastyn Silvey, Bronwen Betters, LCSW

## 2015-12-23 NOTE — Progress Notes (Signed)
Per daughter, patient was NPO due to failed swallow screen in ED - [place] notified. Order to keep patient NPO until seen by SLP.

## 2015-12-23 NOTE — Evaluation (Signed)
Clinical/Bedside Swallow Evaluation Patient Details  Name: [name] MRN: [id number] Date of Birth: 05/12/1936  Today's Date: 12/23/2015 Time: SLP Start Time (ACUTE ONLY): 0830 SLP Stop Time (ACUTE ONLY): 0930 SLP Time Calculation (min) (ACUTE ONLY): 60 min  Past Medical History:  Past Medical History  Diagnosis Date  . DM (diabetes mellitus) ([place])   . Mitral valve prolapse     a. Not noted on 03/2015 Echo.  Marland Kitchen COPD (chronic obstructive pulmonary disease) ([place])   . Palpitations   . Osteoarthritis   . Systemic lupus erythematosus ([place])   . Lumbar spondylolysis   . Raynaud's disease   . Anxiety   . IBS (irritable bowel syndrome)   . Uveitis, anterior     ????  . Rheumatic fever   . Ankylosing spondylitis ([name])     ? how diagnosed  . Insomnia, unspecified   . Chronic peptic ulcer, unspecified site, without mention of hemorrhage, perforation, or obstruction   . History of kidney stones   . Chronic kidney infection   . Asthma     as child  . Hypothyroidism     mass, U/S 6/21 - nodules  . Motion sickness     car - back seat  . GERD (gastroesophageal reflux disease)   . Wears dentures     full upper, partial lower  . Wears contact lenses   . COPD (chronic obstructive pulmonary disease) ([place])     a.Uses 2l prn - followed by Dr. Raul Del.  . Raynaud disease   . Headache   . Neuropathy ([place])   . Wheezing   . Chronic diastolic CHF (congestive heart failure) ([place])     a. 03/2015 Echo: EF 55-60%, Gr 1 DD.  Marland Kitchen PAF (paroxysmal atrial fibrillation) ([place])     a. 03/2015 in setting of hip Fx->Amio/Xarelo;  b. CHA2DS2VASc= 4.  . Left displaced femoral neck fracture ([place])     a. 03/2015 s/p L hemiarthroplasty.  . Diabetes mellitus ([place])    Past Surgical History:  Past Surgical History  Procedure Laterality Date  . Esophagogastroduodenoscopy  multiple  . Colonoscopy  multiple  . Total abdominal hysterectomy w/ bilateral salpingoophorectomy  1990  . Breast biopsy    .  Biopsy thyroid    . Esophagogastroduodenoscopy (egd) with propofol N/A 12/11/2014    Procedure: ESOPHAGOGASTRODUODENOSCOPY (EGD) WITH PROPOFOL;  Surgeon: [name], MD;  Location: [facility];  Service: Endoscopy;  Laterality: N/A;  cytology brushing  . Cataract extraction w/phaco Left 03/23/2015    Procedure: CATARACT EXTRACTION PHACO AND INTRAOCULAR LENS PLACEMENT (IOC);  Surgeon: Birder Robson, MD;  Location: [facility];  Service: Ophthalmology;  Laterality: Left;  [place] 00:51   . Hip arthroplasty Left 03/26/2015    Procedure: ARTHROPLASTY BIPOLAR HIP (HEMIARTHROPLASTY);  Surgeon: Earnestine Leys, MD;  Location: [facility];  Service: Orthopedics;  Laterality: Left;  . Cataract extraction w/phaco Right 05/25/2015    Procedure: CATARACT EXTRACTION PHACO AND INTRAOCULAR LENS PLACEMENT (IOC);  Surgeon: Birder Robson, MD;  Location: [facility];  Service: Ophthalmology;  Laterality: Right;  [place] 00:57   . Joint replacement Left   . Hip arthroplasty Right 11/06/2015    Procedure: ARTHROPLASTY BIPOLAR HIP (HEMIARTHROPLASTY);  Surgeon: Thornton Park, MD;  Location: [facility];  Service: Orthopedics;  Laterality: Right;   HPI:  [name] is a 80 y.o. female presents with altered mental status and sleeping a lot. Brought in by family. Yesterday she was sleeping a lot having chills and cold feeling. She is not been  herself. She urinated in the bed. She's been pretty unresponsive and couldn't walk or talk very well. Yesterday family noticed eyelids was drooping and she did not eat or drink. She was rectally impacted and had some abdominal pain on presentation and was disimpacted. She felt like vomiting. She is also had the urge to urinate. Pt with previous Bedside Swallow Evaluation on 03/29/2015 with the following information given. Pt appeared to adequately tolerate trials of thin liquids and soft solids w/ no overt s/s of aspiration; no decline in respiratory status and clear vocal quality b/t trials  was noted. No decline in O2 sats noted. Oral phase wfl w/ all trials. Post swallowing trials, pt began beching and c/o dysmotility pointing to her lower sternum area. Pt was instructed to rest and encouraged single, small ice chips to aid perastalsis/esophageal motility. Pt's symptoms eased after a few minutes - of note, pt only had consumed 8-10 trials total b/f this reaction began. Pt appears at reduced risk for aspiration from an oropharyngeal standpoint but at increased risk for aspiration from an Esophageal phase/dysmotility standpoint. Rec. f/u w/ Pulmonolgy/GI for the Thrush (which pt/family believe is d/t the inhalers pt uses). Per daughter, pt consuming "anything that she likes" at home. Pt and daughter don't recall any of the above from 03/2015. Pt currently NPO d/t failing ED swallow screening. Skilled ST required to assess safety of PO intake.     Assessment / Plan / Recommendation Clinical Impression  Pt is at mild risk for aspiration following general aspriaiton precautions when consuming thin liquids d/t occasional inattention/awarenss to bolus. Education provided to pt and daughter on cueing pt to swallow. Overall, pt without overt s/s of aspiration and presents with timely swallow initiation for most sips of thins via straw. Pt effectively managed and cleared trials of regular but requires family to cut up meats at home, so dysphagia 3 diet is most approrpiate. Education provided that pt needs intermittent supervision for cues to be attentive to liquid/food in mouth. ST recommends return to PO intake with dysphagia 3 diet and thin liquids, medicine whole in applesauce. Pt didn't display any s/s of esophageal discomfort when consuming with ST. Skilled ST doesn't appear indicated as pt appears at baseline. Pt and daughter verbalized understanding and agreement.     Aspiration Risk  Mild aspiration risk    Diet Recommendation Dysphagia 3  With thin liquids  Medication Administration: Whole  meds with puree    Other  Recommendations Oral Care Recommendations: Oral care BID   Follow up Recommendations  None           Prognosis Prognosis for Safe Diet Advancement: Good      Swallow Study   General Date of Onset: 12/22/15 HPI: [name] is a 80 y.o. female presents with altered mental status and sleeping a lot. Brought in by family. Yesterday she was sleeping a lot having chills and cold feeling. She is not been herself. She urinated in the bed. She's been pretty unresponsive and couldn't walk or talk very well. Yesterday family noticed eyelids was drooping and she did not eat or drink. She was rectally impacted and had some abdominal pain on presentation and was disimpacted. She felt like vomiting. She is also had the urge to urinate. Pt with previous Bedside Swallow Evaluation on 03/29/2015 with the following information given. Pt appeared to adequately tolerate trials of thin liquids and soft solids w/ no overt s/s of aspiration; no decline in respiratory status and clear vocal quality  b/t trials was noted. No decline in O2 sats noted. Oral phase wfl w/ all trials. Post swallowing trials, pt began beching and c/o dysmotility pointing to her lower sternum area. Pt was instructed to rest and encouraged single, small ice chips to aid perastalsis/esophageal motility. Pt's symptoms eased after a few minutes - of note, pt only had consumed 8-10 trials total b/f this reaction began. Pt appears at reduced risk for aspiration from an oropharyngeal standpoint but at increased risk for aspiration from an Esophageal phase/dysmotility standpoint. Rec. f/u w/ Pulmonolgy/GI for the Thrush (which pt/family believe is d/t the inhalers pt uses). Per daughter, pt consuming "anything that she likes" at home. Pt and daughter don't recall any of the above from 03/2015. Pt currently NPO d/t failing ED swallow screening. Skilled ST required to assess safety of PO intake.   Type of Study: Bedside Swallow  Evaluation Previous Swallow Assessment:  (Yes, 03/29/2015) Diet Prior to this Study: NPO Temperature Spikes Noted: No Respiratory Status: Room air History of Recent Intubation: No Behavior/Cognition: Alert;Cooperative;Pleasant mood;Confused;Requires cueing Oral Cavity Assessment: Within Functional Limits Oral Care Completed by SLP: No Oral Cavity - Dentition: Adequate natural dentition Vision: Functional for self-feeding Self-Feeding Abilities: Able to feed self;Needs assist Patient Positioning: Upright in bed Baseline Vocal Quality: Normal Volitional Cough: Strong Volitional Swallow: Able to elicit    Oral/Motor/Sensory Function Overall Oral Motor/Sensory Function: Within functional limits   Ice Chips Ice chips: Within functional limits Presentation: Spoon (ST fed, 10 trials)   Thin Liquid Thin Liquid: Within functional limits Presentation: Cup;Self Fed;Straw Other Comments: Pt with occasional holding of thin liquids only in mouth. Daughter reports this is baseline for "last several months" as cognition has declined. Pt with timely swallow intiation for majority of bolus nad holding appears related to decreased cognitive awareness of bolus. Requires intermittent supervision as result.     Nectar Thick Nectar Thick Liquid: Not tested   Honey Thick Honey Thick Liquid: Not tested   Puree Puree: Within functional limits Presentation: Self Fed;Spoon   Solid   GO   Solid: Within functional limits Presentation: Self Fed;Spoon Other Comments: [facility] erpots taht they had to cut up pt's meats at baseline.         Elfego Giammarino 12/23/2015,12:22 PM

## 2015-12-23 NOTE — Progress Notes (Signed)
*  PRELIMINARY RESULTS* Echocardiogram 2D Echocardiogram has been performed.  [name] Sport 12/23/2015, 10:31 AM

## 2015-12-23 NOTE — Clinical Social Work Note (Signed)
Clinical Social Work Assessment  Patient Details  Name: [name] MRN: [id number] Date of Birth: 10-17-1935  Date of referral:  12/23/15               Reason for consult:  Other (Comment Required), Facility Placement                Permission sought to share information with:  Chartered certified accountant granted to share information::  Yes, Verbal Permission Granted  Name::      [place]::   [place]   Relationship::     Contact Information:     Housing/Transportation Living arrangements for the past 2 months:  [place], [place] of Information:  Patient, Adult Children Patient Interpreter Needed:  None Criminal Activity/Legal Involvement Pertinent to Current Situation/Hospitalization:  No - Comment as needed Significant Relationships:  Adult Children, Spouse Lives with:  Spouse Do you feel safe going back to the place where you live?  Yes Need for family participation in patient care:  Yes (Comment)  Care giving concerns:  Patient lives in [place] with her husband.    Social Worker assessment / plan:  Holiday representative (CSW) reviewed chart and noted that patient was recently at [facility]. Per [facility] admissions coordinator at [facility] patient was at [facility] from 11/10/15 to 11/26/15. Patient came [facility] from home. CSW met with patient and her daughter Maudie Mercury was at bedside. Patient was pleasantly confused however she could answer basic questions. Per patient she lives with her husband in [place]. Per daughter Maudie Mercury she lives at [place] and her sister Lattie Haw lives in [place]. Per daughter patient has been in and out of rehab and is on chronic oxygen at home. Per daughter patient is open to [facility] home health. CSW explained that PT will work with patient and make a recommendation of home health or SNF. Per daughter and patient if SNF is the recommendation then the preference is [facility].  Per daughter patient's husband will likely want to take patient home. Patient and daughter are agreeable to SNF search in [place].   FL2 complete and faxed out. PT is pending. CSW will continue to follow and assist as needed.   Employment status:  Retired, Disabled (Comment on whether or not currently receiving Disability) Insurance information:  Medicare PT Recommendations:  Not assessed at this time Information / Referral to community resources:  [place] (SNF [facility] )  Patient/Family's Response to care:  Patient and daughter are agreeable to SNF search.   Patient/Family's Understanding of and Emotional Response to Diagnosis, Current Treatment, and Prognosis:  Patient and daughter were pleasant and thanked CSW for visit.   Emotional Assessment Appearance:  Appears stated age Attitude/Demeanor/Rapport:    Affect (typically observed):  Pleasant Orientation:  Oriented to Self, Fluctuating Orientation (Suspected and/or reported Sundowners) Alcohol / Substance use:  Not Applicable Psych involvement (Current and /or in the community):  No (Comment)  Discharge Needs  Concerns to be addressed:  Discharge Planning Concerns Readmission within the last 30 days:  No Current discharge risk:  Dependent with Mobility, Chronically ill, Cognitively Impaired Barriers to Discharge:  Continued Medical Work up   [employer], Bronwen Betters, LCSW 12/23/2015, 4:42 PM

## 2015-12-23 NOTE — Progress Notes (Signed)
New orders for right foot dressing - already changed by MD. Will keep elevated, off pressure and continue to monitor.

## 2015-12-23 NOTE — Care Management Note (Signed)
Case Management Note  Patient Details  Name: [name] MRN: [id number] Date of Birth: 05-02-1936  Subjective/Objective:     Spoke with patient and adult daughter at the bedside. Patient is pleasantly confused from home with her husband who is alert and independent according to the daughter. Daughter stated that the patient has all DME including Rolling walker, wheelchair, BSC,  and shower chair. Husband drives to appointments.  Patient was open to [facility] prior to admission.  Requested PT evaluation from attending. CSW following patient as well.            Action/Plan:Anticpated discharge plan is Home Health.   Expected Discharge Date:                  Expected Discharge Plan:  [place]  In-House Referral:  Clinical Social Work  Discharge planning Services  CM Consult  Post Acute Care Choice:    Choice offered to:  Adult Children, Patient  DME Arranged:  N/A DME Agency:  NA  HH Arranged:  PT, RN HH Agency:  [facility]  Status of Service:  In process, will continue to follow  If discussed at Long Length of Stay Meetings, dates discussed:    Additional Comments:  Alvie Heidelberg, RN 12/23/2015, 3:23 PM

## 2015-12-23 NOTE — Progress Notes (Signed)
Patient complaining of chest pain. VSS 151/53 HR 84 Sats 95% RA. Oders received for EKG and morphine for pain. Will continue to monitor.

## 2015-12-23 NOTE — Progress Notes (Signed)
Patient still complaining of chest pain. MD notified. Orders received for nitropaste and troponins. Will continue to monitor.

## 2015-12-23 NOTE — Consult Note (Signed)
Cardiology Consultation Note  Patient ID: [name], MRN: [id number], DOB/AGE: 09/28/1935 80 y.o. Admit date: 12/22/2015   Date of Consult: 12/23/2015 Primary Physician: Halina Maidens, MD Primary Cardiologist: Dr. Fletcher Anon, MD Requesting Physician: Dr. Vianne Bulls, MD  Chief Complaint: SOB/AMS Reason for Consult: Atypical chest pain in the setting of PNA  HPI: 80 y.o. female with h/o PAF on Xarelto in the setting of femur fracture s/p hemiarthroplasty, chronic diastolic CHF, COPD, mitrla valve prolapse, rheumatic fever, and asthma who presented with AMS and was found to have metabolic encephalopathy 2/2 infection with right upper lobe PNA. She has had some pleuritic chest pain and found to have a mildly elevated and down trending troponin.   She suffered a left femoral neck fracture in 03/2015. Post-op course was complicated by AF RVR and also acute diast CHF in the setting of IVF. Echo showed nl LV fxn. She was diuresed and placed on amio and xarelto with control of AF. She was subsequently d/c'd to rehab where she did well.   She presented to [facility] on 7/12 by her family 2/2 increased somnolence and AMS. She was having fevers, chills, and cough. No exertional chest pain, only with cough.   Upon the patient's arrival to [facility] they were found to have mildly elevated, down trending troponin at <0.03-->0.16-->0.10-->0.05, WBC 17.0-->17.7, HGB 11.0, SCr 0.61, K+ 4.0-->3.5, blood culture with no growth x 2 at <24 hours. ECG as below, CXR showed *right upper lobe PNA which was persistent on CXR 7/13. CT head without acute process. MRI brain without acute finding. She has been treated with ABX per IM. Echo showed normal EF with no WMA. Cardiology is asked to see the patient for pleuritic chest pain felt to be in the setting of the patient's pneumonia and mildly elevated, down trending troponin.   Past Medical History  Diagnosis Date  . DM (diabetes mellitus) ([place])   . Mitral valve prolapse    a. Not noted on 03/2015 Echo.  Marland Kitchen COPD (chronic obstructive pulmonary disease) ([place])   . Palpitations   . Osteoarthritis   . Systemic lupus erythematosus ([place])   . Lumbar spondylolysis   . Raynaud's disease   . Anxiety   . IBS (irritable bowel syndrome)   . Uveitis, anterior     ????  . Rheumatic fever   . Ankylosing spondylitis ([place])     ? how diagnosed  . Insomnia, unspecified   . Chronic peptic ulcer, unspecified site, without mention of hemorrhage, perforation, or obstruction   . History of kidney stones   . Chronic kidney infection   . Asthma     as child  . Hypothyroidism     mass, U/S 6/21 - nodules  . Motion sickness     car - back seat  . GERD (gastroesophageal reflux disease)   . Wears dentures     full upper, partial lower  . Wears contact lenses   . COPD (chronic obstructive pulmonary disease) ([place])     a.Uses 2l prn - followed by Dr. Raul Del.  . Raynaud disease   . Headache   . Neuropathy ([place])   . Wheezing   . Chronic diastolic CHF (congestive heart failure) ([place])     a. 03/2015 Echo: EF 55-60%, Gr 1 DD.  Marland Kitchen PAF (paroxysmal atrial fibrillation) ([place])     a. 03/2015 in setting of hip Fx->Amio/Xarelo;  b. CHA2DS2VASc= 4.  . Left displaced femoral neck fracture ([place])     a. 03/2015 s/p L hemiarthroplasty.  Marland Kitchen  Diabetes mellitus ([place])       Most Recent Cardiac Studies: Echo 03/29/2015: Study Conclusions  - Procedure narrative: Transthoracic echocardiography. Image  quality was poor. The study was technically difficult, as a  result of poor acoustic windows and poor sound wave transmission. - Left ventricle: The cavity size was normal. Wall thickness was  normal. Systolic function was normal. The estimated ejection  fraction was in the range of 55% to 60%. Doppler parameters are  consistent with abnormal left ventricular relaxation (grade 1  diastolic dysfunction).  Nuclear stress test 05/27/2013: NO significant sichemia, no artifact, no WMA, EF  53%. Normal study.    Surgical History:  Past Surgical History  Procedure Laterality Date  . Esophagogastroduodenoscopy  multiple  . Colonoscopy  multiple  . Total abdominal hysterectomy w/ bilateral salpingoophorectomy  1990  . Breast biopsy    . Biopsy thyroid    . Esophagogastroduodenoscopy (egd) with propofol N/A 12/11/2014    Procedure: ESOPHAGOGASTRODUODENOSCOPY (EGD) WITH PROPOFOL;  Surgeon: [name], MD;  Location: [place];  Service: Endoscopy;  Laterality: N/A;  cytology brushing  . Cataract extraction w/phaco Left 03/23/2015    Procedure: CATARACT EXTRACTION PHACO AND INTRAOCULAR LENS PLACEMENT (IOC);  Surgeon: Birder Robson, MD;  Location: [facility];  Service: Ophthalmology;  Laterality: Left;  [place] 00:51   . Hip arthroplasty Left 03/26/2015    Procedure: ARTHROPLASTY BIPOLAR HIP (HEMIARTHROPLASTY);  Surgeon: Earnestine Leys, MD;  Location: [facility];  Service: Orthopedics;  Laterality: Left;  . Cataract extraction w/phaco Right 05/25/2015    Procedure: CATARACT EXTRACTION PHACO AND INTRAOCULAR LENS PLACEMENT (IOC);  Surgeon: Birder Robson, MD;  Location: [facility];  Service: Ophthalmology;  Laterality: Right;  [place] 00:57   . Joint replacement Left   . Hip arthroplasty Right 11/06/2015    Procedure: ARTHROPLASTY BIPOLAR HIP (HEMIARTHROPLASTY);  Surgeon: Thornton Park, MD;  Location: [facility];  Service: Orthopedics;  Laterality: Right;     Home Meds: Prior to Admission medications   Medication Sig Start Date End Date Taking? Authorizing Provider  albuterol (PROVENTIL HFA;VENTOLIN HFA) 108 (90 Base) MCG/ACT inhaler Inhale 2 puffs into the lungs 4 (four) times daily as needed for wheezing or shortness of breath.   Yes Historical Provider, MD  albuterol (PROVENTIL) (2.5 MG/3ML) 0.083% nebulizer solution Take 2.5 mg by nebulization every 6 (six) hours as needed for wheezing or shortness of breath. Reported on 07/09/2015   Yes Historical Provider, MD  amiodarone  (PACERONE) 200 MG tablet Take 1 tablet (200 mg total) by mouth daily. 06/22/15  Yes Glean Hess, MD  docusate sodium (COLACE) 100 MG capsule Take 1 capsule (100 mg total) by mouth 2 (two) times daily. 11/10/15  Yes Srikar Sudini, MD  ferrous sulfate 325 (65 FE) MG tablet Take 1 tablet (325 mg total) by mouth 2 (two) times daily with a meal. 11/10/15  Yes Srikar Sudini, MD  JANUVIA 100 MG tablet Take 0.5 tablets (50 mg total) by mouth daily. 06/22/15  Yes Glean Hess, MD  LORazepam (ATIVAN) 1 MG tablet Take 1 tablet (1 mg total) by mouth at bedtime. 11/10/15  Yes Srikar Sudini, MD  memantine (NAMENDA) 5 MG tablet Take 5 mg by mouth 2 (two) times daily.   Yes Historical Provider, MD  metFORMIN (GLUCOPHAGE-XR) 500 MG 24 hr tablet Take 1 tablet (500 mg total) by mouth daily before supper. 12/20/15  Yes Glean Hess, MD  metoprolol tartrate (LOPRESSOR) 25 MG tablet Take 25 mg by mouth 2 (two) times daily.  Yes Historical Provider, MD  mometasone-formoterol (DULERA) 100-5 MCG/ACT AERO Inhale 2 puffs into the lungs 2 (two) times daily. 07/16/15  Yes Glean Hess, MD  oxyCODONE (OXY IR/ROXICODONE) 5 MG immediate release tablet Take 1 tablet (5 mg total) by mouth every 4 (four) hours as needed for severe pain ((for MODERATE breakthrough pain)). 11/10/15  Yes Srikar Sudini, MD  Probiotic Product (ALIGN) 4 MG CAPS Take 1 capsule (4 mg total) by mouth daily. 06/22/15  Yes Glean Hess, MD  ranitidine (ZANTAC) 150 MG tablet Take 1 tablet (150 mg total) by mouth 2 (two) times daily. 06/22/15  Yes Glean Hess, MD  rivaroxaban (XARELTO) 20 MG TABS tablet Take 1 tablet (20 mg total) by mouth daily. 06/22/15  Yes Glean Hess, MD  senna (SENOKOT) 8.6 MG TABS tablet Take 1 tablet by mouth 2 (two) times daily.   Yes Historical Provider, MD  tiotropium (SPIRIVA) 18 MCG inhalation capsule Place 1 capsule (18 mcg total) into inhaler and inhale daily. 06/22/15  Yes Glean Hess, MD  feeding supplement,  ENSURE ENLIVE, (ENSURE ENLIVE) LIQD Take 237 mLs by mouth 2 (two) times daily between meals. 11/10/15   Hillary Bow, MD    Inpatient Medications:  . amiodarone  200 mg Oral Daily  . clindamycin (CLEOCIN) IV  600 mg Intravenous Q8H  . docusate sodium  100 mg Oral BID  . famotidine (PEPCID) IV  20 mg Intravenous Q12H  . insulin aspart  0-15 Units Subcutaneous TID WC  . insulin aspart  0-5 Units Subcutaneous QHS  . metoprolol tartrate  25 mg Oral BID  . mometasone-formoterol  2 puff Inhalation BID  . rivaroxaban  15 mg Oral Q supper  . senna  1 tablet Oral BID  . sodium chloride flush  3 mL Intravenous Q12H  . sucralfate  1 g Oral TID WC  . tiotropium  18 mcg Inhalation Daily   . sodium chloride 50 mL/hr at 12/22/15 2135    Allergies:  Allergies  Allergen Reactions  . Codeine Nausea And Vomiting  . Penicillins Hives    Has patient had a PCN reaction causing immediate rash, facial/tongue/throat swelling, SOB or lightheadedness with hypotension: no  Has patient had a PCN reaction causing severe rash involving mucus membranes or skin necrosis: no  Has patient had a PCN reaction that required hospitalization: no  Has patient had a PCN reaction occurring within the last 10 years: no  If all of the above answers are "NO", then may proceed with Cephalosporin use.   . Sulfa Antibiotics   . Doxycycline Rash  . Erythromycin Rash  . Latex Rash    Social History   Social History  . Marital Status: Married    Spouse Name: N/A  . Number of Children: 4  . Years of Education: N/A   Occupational History  . retired    Social History Main Topics  . Smoking status: Former Smoker -- 0.25 packs/day for 40 years    Types: Cigarettes    Quit date: 10/11/2011  . Smokeless tobacco: Never Used     Comment: Quit 3 years  . Alcohol Use: No  . Drug Use: No  . Sexual Activity: Not on file   Other Topics Concern  . Not on file   Social History Narrative   Married (#2)   Moved from  [place] in Summer 2014 - Lives in [place]   1 son, 3 daughters (one lives in this area)  2 caffeinated beverages daily   04/18/2013 update     Family History  Problem Relation Age of Onset  . Kidney disease Brother   . Heart disease Mother   . Hypothyroidism Mother   . Diabetes Mother   . Heart attack Mother   . Hypertension Mother   . Heart disease Father   . Heart attack Father   . Breast cancer Sister   . Hypothyroidism Sister   . Diabetes Sister   . Ovarian cancer Sister   . Diabetes Brother   . Prostate cancer Brother   . Hypothyroidism Daughter      Review of Systems: Review of Systems  Unable to perform ROS: medical condition  Metabolic encephalopathy   Labs:  Recent Labs  12/22/15 1203 12/23/15 0507 12/23/15 0734 12/23/15 1310  TROPONINI <0.03 0.16* 0.10* 0.05*   Lab Results  Component Value Date   WBC 17.7* 12/23/2015   HGB 10.3* 12/23/2015   HCT 31.6* 12/23/2015   MCV 88.4 12/23/2015   PLT 214 12/23/2015     Recent Labs Lab 12/22/15 1203 12/23/15 0507  NA 135 138  K 4.0 3.5  CL 98* 104  CO2 30 26  BUN 15 17  CREATININE 0.73 0.61  CALCIUM 9.0 8.7*  PROT 7.1  --   BILITOT 0.6  --   ALKPHOS 103  --   ALT 34  --   AST 53*  --   GLUCOSE 283* 195*   Lab Results  Component Value Date   CHOL 125 12/23/2015   HDL 48 12/23/2015   LDLCALC 65 12/23/2015   TRIG 59 12/23/2015   No results found for: DDIMER  Radiology/Studies:  Dg Chest 2 View  12/22/2015  CLINICAL DATA:  80 year old female with increased weakness and fatigue. EXAM: CHEST  2 VIEW COMPARISON:  11/09/2015 radiographs FINDINGS: The cardiomediastinal silhouette is unremarkable. Inferior right upper lobe airspace opacity likely represents pneumonia. There is no evidence of pulmonary edema, pneumothorax, mass or pleural effusion. No acute bony abnormalities are identified. IMPRESSION: Right upper lobe airspace opacity compatible with pneumonia. Radiographic follow-up to  resolution is recommended. Electronically Signed   By: Margarette Canada M.D.   On: 12/22/2015 14:11   Dg Pelvis 1-2 Views  12/22/2015  CLINICAL DATA:  Pain following fall EXAM: PELVIS - 1-2 VIEW COMPARISON:  None. FINDINGS: There is no evidence of pelvic fracture or diastases. There are total hip replacements bilaterally with the prosthetic components appearing well-seated bilaterally. There is mild osteoarthritic change in both sacroiliac joints. There is calcification inferior to the right ischium, likely of chronic inflammatory etiology. IMPRESSION: Status post total hip replacements bilaterally with prosthetic components appearing well-seated. No acute fracture or dislocation. Mild osteoarthritic change in both sacroiliac joints. Electronically Signed   By: Lowella Grip III M.D.   On: 12/22/2015 14:12   Ct Head Wo Contrast  12/22/2015  CLINICAL DATA:  80 year old female with increased weakness and fatigue and altered mental status. EXAM: CT HEAD WITHOUT CONTRAST TECHNIQUE: Contiguous axial images were obtained from the base of the skull through the vertex without intravenous contrast. COMPARISON:  07/01/2015 CT and MRI FINDINGS: Mild cerebral atrophy and moderate chronic small-vessel white matter ischemic changes again noted. No acute intracranial abnormalities are identified, including mass lesion or mass effect, hydrocephalus, extra-axial fluid collection, midline shift, hemorrhage, or acute infarction. The visualized bony calvarium is unremarkable. IMPRESSION: No evidence of acute intracranial abnormality. Atrophy and chronic small-vessel white matter ischemic changes. Electronically Signed   By: Dellis Filbert  Hu M.D.   On: 12/22/2015 14:24   Mr Brain Wo Contrast  12/23/2015  CLINICAL DATA:  Altered mental status and somnolence.  Incontinence. EXAM: MRI HEAD WITHOUT CONTRAST TECHNIQUE: Multiplanar, multiecho pulse sequences of the brain and surrounding structures were obtained without intravenous  contrast. COMPARISON:  CT 12/22/2015.  MRI 07/01/2015. FINDINGS: Diffusion imaging does not show any acute or subacute infarction. There chronic small-vessel ischemic changes throughout the pons. No cerebellar insult. Cerebral hemispheres show atrophy and chronic small vessel ischemic change throughout the deep and subcortical white matter. No cortical or large vessel territory infarction. No intra-axial mass lesion, hemorrhage, hydrocephalus or subdural collection. Small left frontal arachnoid cyst is unchanged and not significant. No pituitary mass. No inflammatory sinus disease. IMPRESSION: No acute or reversible finding. Atrophy and chronic small-vessel ischemic changes throughout the brain. No change in an insignificant left frontal arachnoid cyst. Electronically Signed   By: Nelson Chimes M.D.   On: 12/23/2015 13:14   Dg Chest Port 1 View  12/23/2015  CLINICAL DATA:  Follow-up right upper lobe infiltrate EXAM: PORTABLE CHEST 1 VIEW COMPARISON:  12/22/2015 FINDINGS: Cardiac shadow is stable. The lungs are well aerated bilaterally. Persistent right upper lobe infiltrate is noted. No significant interval change is seen. No new focal infiltrate is noted no bony abnormality is seen. IMPRESSION: Stable right upper lobe infiltrate. Electronically Signed   By: Inez Catalina M.D.   On: 12/23/2015 07:10   Dg Foot Complete Right  12/22/2015  CLINICAL DATA:  [place] 1-2 weeks ago, RIGHT foot ulcers which are bandaged EXAM: RIGHT FOOT COMPLETE - 3+ VIEW COMPARISON:  12/06/2015 FINDINGS: Dressing artifacts hindfoot. Osseous demineralization. Joint spaces preserved. No acute fracture, dislocation or bone destruction. IMPRESSION: No acute osseous abnormalities. Electronically Signed   By: Lavonia Dana M.D.   On: 12/22/2015 14:10   Dg Foot Complete Right  12/06/2015  CLINICAL DATA:  Nonhealing wound. EXAM: RIGHT FOOT COMPLETE - 3+ VIEW COMPARISON:  No recent prior. FINDINGS: No acute bony or joint abnormality identified.  Ulceration noted of the soft tissues of the heel. No underlying erosive bony abnormality identified. If osteomyelitis remains a clinical concern MRI can be obtained . IMPRESSION: Soft tissue ulceration of the heel. No underlying erosive bony abnormality. Diffuse degenerative change. No acute bony abnormality. Electronically Signed   By: Marcello Moores  Register   On: 12/06/2015 12:17    EKG: Interpreted by me showed: NSR, 89 bpm, prolonged QTc, poor R wave progression, nonspecific inferolateral st/t changes  Telemetry: Interpreted by me showed: NSR, 70's bpm  Weights: Filed Weights   12/22/15 1134  Weight: 110 lb (49.896 kg)     Physical Exam: Blood pressure 153/61, pulse 73, temperature 99 F (37.2 C), temperature source Oral, resp. rate 18, height 5\' 4"  (1.626 m), weight 110 lb (49.896 kg), SpO2 97 %. Body mass index is 18.87 kg/(m^2). General: Well developed, well nourished, in no acute distress. Head: Normocephalic, atraumatic, sclera non-icteric, no xanthomas, nares are without discharge.  Neck: Negative for carotid bruits. JVD not elevated. Lungs: Decreased breath sounds bilaterally. Breathing is unlabored. Heart: RRR with S1 S2. No murmurs, rubs, or gallops appreciated. Chest pain is 100% reproducible to palpation on exam.  Abdomen: Soft, non-tender, non-distended with normoactive bowel sounds. No hepatomegaly. No rebound/guarding. No obvious abdominal masses. Msk:  Strength and tone appear normal for age. Extremities: No clubbing or cyanosis. No edema. Distal pedal pulses are 2+ and equal bilaterally. Neuro: Alert and oriented X 3. No facial asymmetry. No focal  deficit. Moves all extremities spontaneously. Psych:  Responds to questions appropriately with a normal affect.    Assessment and Plan:  Principal Problem:   Acute respiratory distress (HCC) Active Problems:   Pneumonia   Metabolic encephalopathy   Sepsis ([place])   Demand ischemia (HCC)   Chronic diastolic CHF (congestive  heart failure) (HCC)   Paroxysmal atrial fibrillation (HCC)   Pressure ulcer    1. Atypical chest pain/pleuritic chest pain in the setting of right upper lobe PNA: -Symptoms c/w her PNA and MSK, not cardiac in etiology -Pain is completely reproducible to palpation on exam leading this to be likely 2/2 her cough and muscle soreness  -Continue treatment per IM  2. Elevated troponin: -Likely supply demand ischemia in the setting of the patient's sepsis in the setting of her right upper lobe PNA -Mildly elevated and down trending not c/w active ACS -Echo with normal EF and wall motion -Can pursue outpatient stress testing when she is back to baseline as an outpatient -Continue current medical therapy  3. PAF: -Remains in sinus rhythm -Continue amiodarone and Lopressor -CrCl is 60.84 mL/min based on bmet from 12/23/2015 -Increase Xarelto to 20 mg q dinner as she does not qualify for decreased dosage with this CrCl -CHADS2VASc at least 4 (CHF, age x 2, female)  4. Metabolic encephalopathy: -In the setting of the patient's infection/PNA -MRI negative for acute process -Per IM  5. Acute respiratory distress: -On 1 L via nasal cannula -Wean as able -In the setting of PNA  6. Sepsis in the setting of right upper lobe PNA: -Per IM  7 COPD: -Per IM  8. Chronic diastolic CHF: -She does not appear to be volume overloaded -BB as above   Signed, Christell Faith, PA-C [facility] Pager: [phone] 12/23/2015, 3:33 PM

## 2015-12-23 NOTE — Progress Notes (Signed)
Patient has not voided since shift change. Offered bedpan several times still unable to void. Patient bladder scanned 530 cc in bladder. MD notified. Order received to in and out cath.

## 2015-12-23 NOTE — Progress Notes (Signed)
Anticoagulation monitoring  80 yo female ordered rivaroxaban 20 mg PO daily  Wt 49.9 kg  CrCl ~ 46.4 ml/min  For CrCl <50 ml/min, will adjust rivaroxaban to 15 mg PO with supper

## 2015-12-23 NOTE — Progress Notes (Signed)
Patient had moderate amount of mucous, yellow/brown vomit with a few bright red spots resembling blood clots. First time this has happened today. Dr. Vianne Bulls notified. Instructed to continue to monitor since patient is on xarelto - will notify MD if this prolongs or gets worse. No new orders at this time.

## 2015-12-23 NOTE — Progress Notes (Signed)
Inpatient Diabetes Program Recommendations  AACE/ADA: New Consensus Statement on Inpatient Glycemic Control (2015)  Target Ranges:  Prepandial:   less than 140 mg/dL      Peak postprandial:   less than 180 mg/dL (1-2 hours)      Critically ill patients:  140 - 180 mg/dL   Lab Results  Component Value Date   GLUCAP 231* 12/23/2015   HGBA1C 8.6* 10/06/2015    Review of Glycemic Control  Results for [name], [name] (MRN [id number]) as of 12/23/2015 09:33  Ref. Range 12/22/2015 18:05 12/22/2015 21:15 12/23/2015 08:04  Glucose-Capillary Latest Ref Range: 65-99 mg/dL 220 (H) 152 (H) 231 (H)    Diabetes history: Type 2 Outpatient Diabetes medications: Januvia 50 mg daily, Metformin 500 mg with supper Current orders for Inpatient glycemic control: Novolog sensitive tid with meals and HS  Inpatient Diabetes Program Recommendations:  Agree with current orders for diabetes management. May need low dose basal insulin (Lantus 5 units)- will follow  Gentry Fitz, RN, [place], [place], CDE Diabetes Coordinator Inpatient Diabetes Program  [phone] (Team Pager) [phone] ([place]) 12/23/2015 9:35 AM

## 2015-12-23 NOTE — Consult Note (Addendum)
[place] [facility] Vascular Consult Note  MRN : [id number]  [name] is a 80 y.o. (April 03, 1936) female who presents with chief complaint of  Chief Complaint  Patient presents with  . Fatigue  . Pressure Ulcer   History of Present Illness:  Consulted by Dr. Madison Hickman for right lower extremity "non-healing heel ulceration". History obtained by daughter and husband at bedside as patient has dementia and is a poor historian.   [name] is a 80 year old female with a history of dementia, DM, COPD, systemic lupus erythematosus and multiple other medical problems. Patient recently fractured her right hip which was fixed here at [facility] on 11/06/15. Daughter states the patient developed a right heel "pressure ulceration" during her inpatient stay at [facility]. She has been receiving wound care at [facility] by Dr. Con Memos for the last 5 weeks. Family states little improvement in her wound.   Patient was taken to [facility] ED by [facility] from home yesterday due to increased lethargy, left facial droop - which has now resolved and increased drainage from her right heel ulcer. Husband denied any fever, nausea or vomiting.   On admission, patient found to have metabolic encephalopathy secondary to pneumonia.    Current Facility-Administered Medications  Medication Dose Route Frequency Provider Last Rate Last Dose  . 0.9 %  sodium chloride infusion   Intravenous Continuous Loletha Grayer, MD 50 mL/hr at 12/22/15 2135    . acetaminophen (TYLENOL) tablet 650 mg  650 mg Oral Q6H PRN Epifanio Lesches, MD   650 mg at 12/23/15 1612   Or  . acetaminophen (TYLENOL) suppository 650 mg  650 mg Rectal Q6H PRN Epifanio Lesches, MD      . albuterol (PROVENTIL) (2.5 MG/3ML) 0.083% nebulizer solution 2.5 mg  2.5 mg Nebulization Q6H PRN Loletha Grayer, MD   2.5 mg at 12/23/15 1532  . amiodarone (PACERONE) tablet 200 mg  200 mg Oral Daily Loletha Grayer, MD   200 mg at 12/23/15 1028  .  clindamycin (CLEOCIN) IVPB 600 mg  600 mg Intravenous Q8H Nena Polio, MD   600 mg at 12/23/15 1425  . docusate sodium (COLACE) capsule 100 mg  100 mg Oral BID Epifanio Lesches, MD   100 mg at 12/23/15 1404  . famotidine (PEPCID) IVPB 20 mg premix  20 mg Intravenous Q12H Loletha Grayer, MD   20 mg at 12/23/15 1028  . insulin aspart (novoLOG) injection 0-15 Units  0-15 Units Subcutaneous TID WC Epifanio Lesches, MD      . insulin aspart (novoLOG) injection 0-5 Units  0-5 Units Subcutaneous QHS Loletha Grayer, MD   0 Units at 12/22/15 2130  . metoprolol tartrate (LOPRESSOR) tablet 25 mg  25 mg Oral BID Loletha Grayer, MD   25 mg at 12/23/15 1028  . mometasone-formoterol (DULERA) 100-5 MCG/ACT inhaler 2 puff  2 puff Inhalation BID Loletha Grayer, MD   2 puff at 12/22/15 2130  . morphine 2 MG/ML injection 2 mg  2 mg Intravenous Q4H PRN Harrie Foreman, MD   2 mg at 12/23/15 1028  . Rivaroxaban (XARELTO) tablet 15 mg  15 mg Oral Q supper Loletha Grayer, MD      . senna [facility]) tablet 8.6 mg  1 tablet Oral BID Epifanio Lesches, MD      . sodium chloride flush (NS) 0.9 % injection 3 mL  3 mL Intravenous Q12H Loletha Grayer, MD   3 mL at 12/22/15 2135  . sucralfate (CARAFATE) 1 GM/10ML  suspension 1 g  1 g Oral TID WC Epifanio Lesches, MD      . tiotropium [facility]) inhalation capsule 18 mcg  18 mcg Inhalation Daily Loletha Grayer, MD   18 mcg at 12/22/15 1805   Past Medical History  Diagnosis Date  . DM (diabetes mellitus) ([place])   . Mitral valve prolapse     a. Not noted on 03/2015 Echo.  Marland Kitchen COPD (chronic obstructive pulmonary disease) ([place])   . Palpitations   . Osteoarthritis   . Systemic lupus erythematosus ([place])   . Lumbar spondylolysis   . Raynaud's disease   . Anxiety   . IBS (irritable bowel syndrome)   . Uveitis, anterior     ????  . Rheumatic fever   . Ankylosing spondylitis ([place])     ? how diagnosed  . Insomnia, unspecified   . Chronic peptic ulcer,  unspecified site, without mention of hemorrhage, perforation, or obstruction   . History of kidney stones   . Chronic kidney infection   . Asthma     as child  . Hypothyroidism     mass, U/S 6/21 - nodules  . Motion sickness     car - back seat  . GERD (gastroesophageal reflux disease)   . Wears dentures     full upper, partial lower  . Wears contact lenses   . COPD (chronic obstructive pulmonary disease) ([place])     a.Uses 2l prn - followed by Dr. Raul Del.  . Raynaud disease   . Headache   . Neuropathy ([place])   . Wheezing   . Chronic diastolic CHF (congestive heart failure) ([place])     a. 03/2015 Echo: EF 55-60%, Gr 1 DD.  Marland Kitchen PAF (paroxysmal atrial fibrillation) ([place])     a. 03/2015 in setting of hip Fx->Amio/Xarelo;  b. CHA2DS2VASc= 4.  . Left displaced femoral neck fracture ([place])     a. 03/2015 s/p L hemiarthroplasty.  . Diabetes mellitus [facility])    Past Surgical History  Procedure Laterality Date  . Esophagogastroduodenoscopy  multiple  . Colonoscopy  multiple  . Total abdominal hysterectomy w/ bilateral salpingoophorectomy  1990  . Breast biopsy    . Biopsy thyroid    . Esophagogastroduodenoscopy (egd) with propofol N/A 12/11/2014    Procedure: ESOPHAGOGASTRODUODENOSCOPY (EGD) WITH PROPOFOL;  Surgeon: [name], MD;  Location: [place];  Service: Endoscopy;  Laterality: N/A;  cytology brushing  . Cataract extraction w/phaco Left 03/23/2015    Procedure: CATARACT EXTRACTION PHACO AND INTRAOCULAR LENS PLACEMENT (IOC);  Surgeon: Birder Robson, MD;  Location: [facility];  Service: Ophthalmology;  Laterality: Left;  [place] 00:51   . Hip arthroplasty Left 03/26/2015    Procedure: ARTHROPLASTY BIPOLAR HIP (HEMIARTHROPLASTY);  Surgeon: Earnestine Leys, MD;  Location: [facility] OR[facility];  Service: Orthopedics;  Laterality: Left;  . Cataract extraction w/phaco Right 05/25/2015    Procedure: CATARACT EXTRACTION PHACO AND INTRAOCULAR LENS PLACEMENT (IOC);  Surgeon: Birder Robson, MD;   Location: [facility];  Service: Ophthalmology;  Laterality: Right;  [place] 00:57   . Joint replacement Left   . Hip arthroplasty Right 11/06/2015    Procedure: ARTHROPLASTY BIPOLAR HIP (HEMIARTHROPLASTY);  Surgeon: Thornton Park, MD;  Location: [facility];  Service: Orthopedics;  Laterality: Right;   Social History Social History  Substance Use Topics  . Smoking status: Former Smoker -- 0.25 packs/day for 40 years    Types: Cigarettes    Quit date: 10/11/2011  . Smokeless tobacco: Never Used     Comment: Quit 3 years  .  Alcohol Use: No   Family History Family History  Problem Relation Age of Onset  . Kidney disease Brother   . Heart disease Mother   . Hypothyroidism Mother   . Diabetes Mother   . Heart attack Mother   . Hypertension Mother   . Heart disease Father   . Heart attack Father   . Breast cancer Sister   . Hypothyroidism Sister   . Diabetes Sister   . Ovarian cancer Sister   . Diabetes Brother   . Prostate cancer Brother   . Hypothyroidism Daughter   Patient denies a family history of PAD or porphyria. Positive family history of renal disease.    Allergies  Allergen Reactions  . Codeine Nausea And Vomiting  . Penicillins Hives    Has patient had a PCN reaction causing immediate rash, facial/tongue/throat swelling, SOB or lightheadedness with hypotension: no  Has patient had a PCN reaction causing severe rash involving mucus membranes or skin necrosis: no  Has patient had a PCN reaction that required hospitalization: no  Has patient had a PCN reaction occurring within the last 10 years: no  If all of the above answers are "NO", then may proceed with Cephalosporin use.   . Sulfa Antibiotics   . Doxycycline Rash  . Erythromycin Rash  . Latex Rash   REVIEW OF SYSTEMS (Negative unless checked)  Constitutional: [] Weight loss  [] Fever  [] Chills Cardiac: [x] Chest pain   [] Chest pressure   [x] Palpitations   [x] Shortness of breath when laying flat   [x] Shortness of  breath at rest   [x] Shortness of breath with exertion. Vascular:  [] Pain in legs with walking   [] Pain in legs at rest   [] Pain in legs when laying flat   [] Claudication   [x] Pain in feet when walking  [x] Pain in feet at rest  [x] Pain in feet when laying flat   [] History of DVT   [] Phlebitis   [] Swelling in legs   [] Varicose veins   [x] Non-healing ulcers Pulmonary:   [] Uses home oxygen   [] Productive cough   [] Hemoptysis   [] Wheeze  [x] COPD   [] Asthma Neurologic:  [] Dizziness  [] Blackouts   [] Seizures   [] History of stroke   [] History of TIA  [] Aphasia   [] Temporary blindness   [] Dysphagia   [x] Weakness or numbness in arms   [x] Weakness or numbness in legs Musculoskeletal:  [] Arthritis   [] Joint swelling   [] Joint pain   [] Low back pain Hematologic:  [] Easy bruising  [] Easy bleeding   [] Hypercoagulable state   [] Anemic  [] Hepatitis Gastrointestinal:  [] Blood in stool   [] Vomiting blood  [] Gastroesophageal reflux/heartburn   [x] Difficulty swallowing. Genitourinary:  [x] Chronic kidney disease   [] Difficult urination  [] Frequent urination  [] Burning with urination   [] Blood in urine Skin:  [] Rashes   [x] Ulcers   [] Wounds Psychological:  [] History of anxiety   []  History of major depression.  Physical Examination  Filed Vitals:   12/23/15 0700 12/23/15 1027 12/23/15 1045 12/23/15 1504  BP: 136/47 127/45 129/49 153/61  Pulse: 87 89 88 73  Temp: 98.1 F (36.7 C)  99.2 F (37.3 C) 99 F (37.2 C)  TempSrc: Oral  Axillary Oral  Resp: 18  18 18   Height:      Weight:      SpO2: 98%  96% 97%   Body mass index is 18.87 kg/(m^2). Gen:  Confused but not aggressive.  Head: [place]/AT, No temporalis wasting. Prominent temp pulse not noted. Ear/Nose/Throat: Hearing grossly intact, nares w/o erythema or drainage,  oropharynx w/o Erythema/Exudate Eyes: PERRLA, EOMI.  Neck: Supple, no nuchal rigidity.  No JVD.  Pulmonary:  Good air movement, clear to auscultation bilaterally.  Cardiac: RRR, normal S1, S2, no  Murmurs, rubs or gallops. Vascular:  Vessel Right Left  Radial Palpable Palpable  Ulnar Palpable Palpable  Brachial Palpable Palpable  Carotid Palpable, without bruit Palpable, without bruit  Aorta Not palpable N/A  Femoral Palpable Palpable  Popliteal Not Palpable Not Palpable  PT Not Palpable Palpable  DP Not Palpable Not Palpable   Gastrointestinal: soft, non-tender/non-distended. No guarding/reflex. No masses. Musculoskeletal: Extremities without ischemic changes.  No deformity or atrophy. No edema. Neurologic: CN 2-12 intact. Pain and light touch intact in extremities.  Symmetrical. Overall weakness noted upper and lower extremity.  Psychiatric: Patient with history of dementia. Dermatologic:   Right Heel: Foul smelling ulceration noted. Moderate amount of drainage noted. Wound covered with fibrinous exudate.  Right Foot ([place]): Small ulceration noted. Minimal drainage. Fibrinous exudate noted.  No cellulitis noted.  Lymph : No Cervical, Axillary, or Inguinal lymphadenopathy.  CBC Lab Results  Component Value Date   WBC 17.7* 12/23/2015   HGB 10.3* 12/23/2015   HCT 31.6* 12/23/2015   MCV 88.4 12/23/2015   PLT 214 12/23/2015   BMET    Component Value Date/Time   NA 138 12/23/2015 0507   NA 139 10/06/2015 1536   NA 137 10/22/2013 0416   K 3.5 12/23/2015 0507   K 4.5 10/22/2013 0416   CL 104 12/23/2015 0507   CL 101 10/22/2013 0416   CO2 26 12/23/2015 0507   CO2 35* 10/22/2013 0416   GLUCOSE 195* 12/23/2015 0507   GLUCOSE 200* 10/06/2015 1536   GLUCOSE 212* 10/22/2013 0416   BUN 17 12/23/2015 0507   BUN 19 10/06/2015 1536   BUN 23* 10/22/2013 0416   CREATININE 0.61 12/23/2015 0507   CREATININE 0.73 10/25/2013 0526   CALCIUM 8.7* 12/23/2015 0507   CALCIUM 9.5 10/22/2013 0416   GFRNONAA >60 12/23/2015 0507   GFRNONAA >60 10/25/2013 0526   GFRAA >60 12/23/2015 0507   GFRAA >60 10/25/2013 0526   Estimated Creatinine Clearance: 46.4 mL/min (by C-G formula  based on Cr of 0.61).  COAG Lab Results  Component Value Date   INR 2.08 11/04/2015   INR 1.34 03/26/2015    Radiology Dg Chest 2 View  12/22/2015  CLINICAL DATA:  80 year old female with increased weakness and fatigue. EXAM: CHEST  2 VIEW COMPARISON:  11/09/2015 radiographs FINDINGS: The cardiomediastinal silhouette is unremarkable. Inferior right upper lobe airspace opacity likely represents pneumonia. There is no evidence of pulmonary edema, pneumothorax, mass or pleural effusion. No acute bony abnormalities are identified. IMPRESSION: Right upper lobe airspace opacity compatible with pneumonia. Radiographic follow-up to resolution is recommended. Electronically Signed   By: Margarette Canada M.D.   On: 12/22/2015 14:11   Dg Pelvis 1-2 Views  12/22/2015  CLINICAL DATA:  Pain following fall EXAM: PELVIS - 1-2 VIEW COMPARISON:  None. FINDINGS: There is no evidence of pelvic fracture or diastases. There are total hip replacements bilaterally with the prosthetic components appearing well-seated bilaterally. There is mild osteoarthritic change in both sacroiliac joints. There is calcification inferior to the right ischium, likely of chronic inflammatory etiology. IMPRESSION: Status post total hip replacements bilaterally with prosthetic components appearing well-seated. No acute fracture or dislocation. Mild osteoarthritic change in both sacroiliac joints. Electronically Signed   By: Lowella Grip III M.D.   On: 12/22/2015 14:12   Ct Head Wo Contrast  12/22/2015  CLINICAL DATA:  80 year old female with increased weakness and fatigue and altered mental status. EXAM: CT HEAD WITHOUT CONTRAST TECHNIQUE: Contiguous axial images were obtained from the base of the skull through the vertex without intravenous contrast. COMPARISON:  07/01/2015 CT and MRI FINDINGS: Mild cerebral atrophy and moderate chronic small-vessel white matter ischemic changes again noted. No acute intracranial abnormalities are  identified, including mass lesion or mass effect, hydrocephalus, extra-axial fluid collection, midline shift, hemorrhage, or acute infarction. The visualized bony calvarium is unremarkable. IMPRESSION: No evidence of acute intracranial abnormality. Atrophy and chronic small-vessel white matter ischemic changes. Electronically Signed   By: Margarette Canada M.D.   On: 12/22/2015 14:24   Mr Brain Wo Contrast  12/23/2015  CLINICAL DATA:  Altered mental status and somnolence.  Incontinence. EXAM: MRI HEAD WITHOUT CONTRAST TECHNIQUE: Multiplanar, multiecho pulse sequences of the brain and surrounding structures were obtained without intravenous contrast. COMPARISON:  CT 12/22/2015.  MRI 07/01/2015. FINDINGS: Diffusion imaging does not show any acute or subacute infarction. There chronic small-vessel ischemic changes throughout the pons. No cerebellar insult. Cerebral hemispheres show atrophy and chronic small vessel ischemic change throughout the deep and subcortical white matter. No cortical or large vessel territory infarction. No intra-axial mass lesion, hemorrhage, hydrocephalus or subdural collection. Small left frontal arachnoid cyst is unchanged and not significant. No pituitary mass. No inflammatory sinus disease. IMPRESSION: No acute or reversible finding. Atrophy and chronic small-vessel ischemic changes throughout the brain. No change in an insignificant left frontal arachnoid cyst. Electronically Signed   By: Nelson Chimes M.D.   On: 12/23/2015 13:14   Dg Chest Port 1 View  12/23/2015  CLINICAL DATA:  Follow-up right upper lobe infiltrate EXAM: PORTABLE CHEST 1 VIEW COMPARISON:  12/22/2015 FINDINGS: Cardiac shadow is stable. The lungs are well aerated bilaterally. Persistent right upper lobe infiltrate is noted. No significant interval change is seen. No new focal infiltrate is noted no bony abnormality is seen. IMPRESSION: Stable right upper lobe infiltrate. Electronically Signed   By: Inez Catalina M.D.   On:  12/23/2015 07:10   Dg Foot Complete Right  12/22/2015  CLINICAL DATA:  [place] 1-2 weeks ago, RIGHT foot ulcers which are bandaged EXAM: RIGHT FOOT COMPLETE - 3+ VIEW COMPARISON:  12/06/2015 FINDINGS: Dressing artifacts hindfoot. Osseous demineralization. Joint spaces preserved. No acute fracture, dislocation or bone destruction. IMPRESSION: No acute osseous abnormalities. Electronically Signed   By: Lavonia Dana M.D.   On: 12/22/2015 14:10   Dg Foot Complete Right  12/06/2015  CLINICAL DATA:  Nonhealing wound. EXAM: RIGHT FOOT COMPLETE - 3+ VIEW COMPARISON:  No recent prior. FINDINGS: No acute bony or joint abnormality identified. Ulceration noted of the soft tissues of the heel. No underlying erosive bony abnormality identified. If osteomyelitis remains a clinical concern MRI can be obtained . IMPRESSION: Soft tissue ulceration of the heel. No underlying erosive bony abnormality. Diffuse degenerative change. No acute bony abnormality. Electronically Signed   By: Marcello Moores  Register   On: 12/06/2015 12:17   Assessment/Plan [name] is a 80 year old female with a history of dementia, DM, COPD, systemic lupus erythematosus and multiple other medical problems. Patient recently fractured her right hip which was fixed here at [facility] on 11/06/15. Daughter states the patient developed a right heel "pressure ulceration" during her inpatient stay at [facility]. She has been receiving wound care at [facility] by Dr. Con Memos for the last 5 weeks. Family states little improvement in her wound. 1) Will plan on  right lower extremity angiogram to rule out any contributing PAD to the patient non-healing ulceration, 2) Procedure, risks and benefits explained to patient, daughter and husband at bedside. All questions answered. Family would like to proceed.  3) Will consult wound care nurse for local wound care recommendations. 4) Metabolic encephalopathy secondary to pneumonia - as per medicine 5) Surgical debridement as per  podiatry 6) Paroxysmal atrial fibrillation - as per Cards 7) Discussed with Dr. Mayme Genta, PA-C  12/23/2015 4:24 PM

## 2015-12-24 LAB — GLUCOSE, CAPILLARY
GLUCOSE-CAPILLARY: 335 mg/dL — AB (ref 65–99)
GLUCOSE-CAPILLARY: 83 mg/dL (ref 65–99)
Glucose-Capillary: 222 mg/dL — ABNORMAL HIGH (ref 65–99)
Glucose-Capillary: 166 mg/dL — ABNORMAL HIGH (ref 65–99)

## 2015-12-24 LAB — CBC
HEMATOCRIT: 30 % — AB (ref 35.0–47.0)
Hemoglobin: 9.7 g/dL — ABNORMAL LOW (ref 12.0–16.0)
MCH: 28.6 pg (ref 26.0–34.0)
MCHC: 32.5 g/dL (ref 32.0–36.0)
MCV: 88 fL (ref 80.0–100.0)
Platelets: 194 10*3/uL (ref 150–440)
RBC: 3.41 MIL/uL — ABNORMAL LOW (ref 3.80–5.20)
RDW: 15.2 % — AB (ref 11.5–14.5)
WBC: 8 10*3/uL (ref 3.6–11.0)

## 2015-12-24 MED ORDER — MORPHINE SULFATE (PF) 2 MG/ML IV SOLN: 2.0000 mg | INTRAVENOUS | Status: DC | PRN

## 2015-12-24 MED ORDER — LORAZEPAM 2 MG/ML IJ SOLN
1.0000 mg | Freq: Once | INTRAMUSCULAR | Status: AC
  Administered 2015-12-25: 1 mg via INTRAVENOUS
  Filled 2015-12-24: qty 1

## 2015-12-24 MED ORDER — INSULIN ASPART 100 UNIT/ML ~~LOC~~ SOLN: 0.0000 [IU] | Freq: Three times a day (TID) | SUBCUTANEOUS | Status: DC

## 2015-12-24 MED ORDER — FAMOTIDINE IN NACL 20-0.9 MG/50ML-% IV SOLN
20.0000 mg | INTRAVENOUS | Status: DC
  Administered 2015-12-25 – 2015-12-27 (×3): 20 mg via INTRAVENOUS
  Filled 2015-12-24 (×4): qty 50

## 2015-12-24 MED ORDER — COLLAGENASE 250 UNIT/GM EX OINT
TOPICAL_OINTMENT | Freq: Every day | CUTANEOUS | Status: DC
  Administered 2015-12-24 – 2015-12-28 (×5): via TOPICAL
  Filled 2015-12-24 (×2): qty 30

## 2015-12-24 MED ORDER — INSULIN GLARGINE 100 UNIT/ML ~~LOC~~ SOLN
10.0000 [IU] | Freq: Every day | SUBCUTANEOUS | Status: DC
  Administered 2015-12-24 – 2015-12-25 (×2): 10 [IU] via SUBCUTANEOUS
  Filled 2015-12-24 (×3): qty 0.1

## 2015-12-24 NOTE — Progress Notes (Signed)
[name] [place] NAME: [name]    MR#:  [id number]  DATE OF BIRTH:  10-04-1935  SUBJECTIVE: Admitted for pneumonia, right heel ulcer. Patient did have chest pain yesterday on the right side.The patient denies any right-sided chest pain today. Overall she says she feels better. Right lower extremity angiogram by vascular today.   CHIEF COMPLAINT:   Chief Complaint  Patient presents with  . Fatigue  . Pressure Ulcer    REVIEW OF SYSTEMS:   Review of Systems  Constitutional: Negative for fever.   CONSTITUTIONAL: No fever,  But has fatigue and generalized weakness.  EYES: No blurred or double vision.  EARS, NOSE, AND THROAT: No tinnitus or ear pain.  RESPIRATORY: No cough, shortness of breath, wheezing or hemoptysis.  CARDIOVASCULAR: No chest pain, orthopnea, edema.  GASTROINTESTINAL: No nausea, vomiting, diarrhea or abdominal pain.  GENITOURINARY: No dysuria, hematuria.  ENDOCRINE: No polyuria, nocturia,  HEMATOLOGY: No anemia, easy bruising or bleeding SKIN: No rash or lesion. MUSCULOSKELETAL:right heel decubiti with some drainage,  NEUROLOGIC: No tingling, numbness, weakness.  PSYCHIATRY: No anxiety or depression.   DRUG ALLERGIES:   Allergies  Allergen Reactions  . Codeine Nausea And Vomiting  . Penicillins Hives    Has patient had a PCN reaction causing immediate rash, facial/tongue/throat swelling, SOB or lightheadedness with hypotension: no  Has patient had a PCN reaction causing severe rash involving mucus membranes or skin necrosis: no  Has patient had a PCN reaction that required hospitalization: no  Has patient had a PCN reaction occurring within the last 10 years: no  If all of the above answers are "NO", then may proceed with Cephalosporin use.   . Sulfa Antibiotics   . Doxycycline Rash  . Erythromycin Rash  . Latex Rash    VITALS:  Blood pressure 129/52, pulse 78, temperature 98.4 F (36.9  C), temperature source Oral, resp. rate 18, height 5\' 4"  (1.626 m), weight 49.896 kg (110 lb), SpO2 94 %.  PHYSICAL EXAMINATION:  GENERAL:  80 y.o.-year-old patient lying in the bed with no acute distress. Appears cachectic, EYES: Pupils equal, round, reactive to light and accommodation. No scleral icterus. Extraocular muscles intact.  HEENT: Head atraumatic, normocephalic. Oropharynx and nasopharynx clear.  NECK:  Supple, no jugular venous distention. No thyroid enlargement, no tenderness.  LUNGS: Normal breath sounds bilaterally, no wheezing, rales,rhonchi or crepitation. No use of accessory muscles of respiration.  CARDIOVASCULAR: S1, S2 normal. No murmurs, rubs, or gallops.  ABDOMEN: Soft, nontender, nondistended. Bowel sounds present. No organomegaly or mass.  EXTREMITIES: No pedal edema, cyanosis, or clubbing.  NEUROLOGIC: Cranial nerves II through XII are intact. Muscle strength 5/5 in all extremities. Sensation intact. Gait not checked.  PSYCHIATRIC: The patient is alert and oriented x 3.  SKIN: Chronic nonhealing pressure injury to the right heel.  Seen  by podiatry.   LABORATORY PANEL:   CBC  Recent Labs Lab 12/24/15 0753  WBC 8.0  HGB 9.7*  HCT 30.0*  PLT 194   ------------------------------------------------------------------------------------------------------------------  Chemistries   Recent Labs Lab 12/22/15 1203 12/23/15 0507  NA 135 138  K 4.0 3.5  CL 98* 104  CO2 30 26  GLUCOSE 283* 195*  BUN 15 17  CREATININE 0.73 0.61  CALCIUM 9.0 8.7*  AST 53*  --   ALT 34  --   ALKPHOS 103  --   BILITOT 0.6  --    ------------------------------------------------------------------------------------------------------------------  Cardiac Enzymes  Recent Labs Lab  12/23/15 1935  TROPONINI <0.03   ------------------------------------------------------------------------------------------------------------------  RADIOLOGY:  Dg Chest 2 View  12/22/2015   CLINICAL DATA:  80 year old female with increased weakness and fatigue. EXAM: CHEST  2 VIEW COMPARISON:  11/09/2015 radiographs FINDINGS: The cardiomediastinal silhouette is unremarkable. Inferior right upper lobe airspace opacity likely represents pneumonia. There is no evidence of pulmonary edema, pneumothorax, mass or pleural effusion. No acute bony abnormalities are identified. IMPRESSION: Right upper lobe airspace opacity compatible with pneumonia. Radiographic follow-up to resolution is recommended. Electronically Signed   By: Margarette Canada M.D.   On: 12/22/2015 14:11   Dg Pelvis 1-2 Views  12/22/2015  CLINICAL DATA:  Pain following fall EXAM: PELVIS - 1-2 VIEW COMPARISON:  None. FINDINGS: There is no evidence of pelvic fracture or diastases. There are total hip replacements bilaterally with the prosthetic components appearing well-seated bilaterally. There is mild osteoarthritic change in both sacroiliac joints. There is calcification inferior to the right ischium, likely of chronic inflammatory etiology. IMPRESSION: Status post total hip replacements bilaterally with prosthetic components appearing well-seated. No acute fracture or dislocation. Mild osteoarthritic change in both sacroiliac joints. Electronically Signed   By: Lowella Grip III M.D.   On: 12/22/2015 14:12   Ct Head Wo Contrast  12/22/2015  CLINICAL DATA:  80 year old female with increased weakness and fatigue and altered mental status. EXAM: CT HEAD WITHOUT CONTRAST TECHNIQUE: Contiguous axial images were obtained from the base of the skull through the vertex without intravenous contrast. COMPARISON:  07/01/2015 CT and MRI FINDINGS: Mild cerebral atrophy and moderate chronic small-vessel white matter ischemic changes again noted. No acute intracranial abnormalities are identified, including mass lesion or mass effect, hydrocephalus, extra-axial fluid collection, midline shift, hemorrhage, or acute infarction. The visualized bony  calvarium is unremarkable. IMPRESSION: No evidence of acute intracranial abnormality. Atrophy and chronic small-vessel white matter ischemic changes. Electronically Signed   By: Margarette Canada M.D.   On: 12/22/2015 14:24   Mr Brain Wo Contrast  12/23/2015  CLINICAL DATA:  Altered mental status and somnolence.  Incontinence. EXAM: MRI HEAD WITHOUT CONTRAST TECHNIQUE: Multiplanar, multiecho pulse sequences of the brain and surrounding structures were obtained without intravenous contrast. COMPARISON:  CT 12/22/2015.  MRI 07/01/2015. FINDINGS: Diffusion imaging does not show any acute or subacute infarction. There chronic small-vessel ischemic changes throughout the pons. No cerebellar insult. Cerebral hemispheres show atrophy and chronic small vessel ischemic change throughout the deep and subcortical white matter. No cortical or large vessel territory infarction. No intra-axial mass lesion, hemorrhage, hydrocephalus or subdural collection. Small left frontal arachnoid cyst is unchanged and not significant. No pituitary mass. No inflammatory sinus disease. IMPRESSION: No acute or reversible finding. Atrophy and chronic small-vessel ischemic changes throughout the brain. No change in an insignificant left frontal arachnoid cyst. Electronically Signed   By: Nelson Chimes M.D.   On: 12/23/2015 13:14   Dg Chest Port 1 View  12/23/2015  CLINICAL DATA:  Follow-up right upper lobe infiltrate EXAM: PORTABLE CHEST 1 VIEW COMPARISON:  12/22/2015 FINDINGS: Cardiac shadow is stable. The lungs are well aerated bilaterally. Persistent right upper lobe infiltrate is noted. No significant interval change is seen. No new focal infiltrate is noted no bony abnormality is seen. IMPRESSION: Stable right upper lobe infiltrate. Electronically Signed   By: Inez Catalina M.D.   On: 12/23/2015 07:10   Dg Foot Complete Right  12/22/2015  CLINICAL DATA:  [place] 1-2 weeks ago, RIGHT foot ulcers which are bandaged EXAM: RIGHT FOOT COMPLETE - 3+  VIEW COMPARISON:  12/06/2015 FINDINGS: Dressing artifacts hindfoot. Osseous demineralization. Joint spaces preserved. No acute fracture, dislocation or bone destruction. IMPRESSION: No acute osseous abnormalities. Electronically Signed   By: Lavonia Dana M.D.   On: 12/22/2015 14:10    EKG:   Orders placed or performed during the hospital encounter of 12/22/15  . EKG 12-Lead  . EKG 12-Lead  . ED EKG  . ED EKG  . EKG 12-Lead  . EKG 12-Lead    ASSESSMENT AND PLAN:  1, metabolic encephalopathy secondary to pneumonia: Improved. Alert,awake oriented. MRI of the brain negative for stroke. Started back on the diet as per speech. For aspiration pneumonia patient is on clindamycin. She has multiple allergies to abx, WBCs normalized.  2. Right-sided pleuritic chest pain secondary to pneumonia. Slightly elevated troponins: Patient has history of proximal atrial fibrillation. Troponins are trending down. Seen by cardiology. Echo study of more than 55%. Xarelto dose has been adjusted according to creatinine clearance. Can have outpatient stress test.  #3. History of GERD continue PPIs, Carafate. Episode  nausea, vomiting with some specks of blood yesterday but none after that. Watch for further episodes of vomiting and hematochezia.  #4 generalized weakness physical therapy recommended rehabilitation placement. Likely discharge on Monday.  #5. Right heel pressure ulcer: In by podiatry, recommended suspension on both heels off the bed or the chair. And wound care dressing changes.  She is scheduled to have a right lower  Extremity  angiogram by vascular today.  6. Suspected; Left facial droop on admission;none now; MRI  brain negative for stroke. Patient does not have any facial droop at this time.   7,  Paroxysmal atrial fibrillation: Patient is on Xarelto,  amiodarone, metoprolol . History of COPD: Patient follows up with Dr. Raul Del, no wheezing at this time. Continue Spiriva, nebulizers.  History  of lupus,; Raynard's  #8. diabetes mellitus type 2: Patient is very malnourished: Will start  Her on low-sodium diet only. Seen by diabetic coordinator. Continue sliding scale with coverage. Hold Januvia, metformin because patient by mouth intake is still not normal yet.  All the records are reviewed and case discussed with Care Management/Social Workerr. Management plans discussed with the patient, family and they are in agreement.  CODE STATUS: DNR  TOTAL TIME TAKING CARE OF THIS PATIENT: 68minutes.   POSSIBLE D/C IN 1-2 DAYS, DEPENDING ON CLINICAL CONDITION.   Epifanio Lesches M.D on 12/24/2015 at 8:44 AM  Between 7am to 6pm - Pager - [phone]  After 6pm go to [email or web address] - password [email or web address] [facility]  Office  [phone]  CC: Primary care physician; Halina Maidens, MD   Note: This dictation was prepared with Dragon dictation along with smaller phrase technology. Any transcriptional errors that result from this process are unintentional.

## 2015-12-24 NOTE — Progress Notes (Signed)
Patient Demographics  [name], is a 80 y.o. female   MRN: [id number]   DOB - 03-21-1936  Admit Date - 12/22/2015    Outpatient Primary MD for the patient is Halina Maidens, MD  Consult requested in the Hospital by Epifanio Lesches, MD, On 12/24/2015     With History of -  Past Medical History  Diagnosis Date  . DM (diabetes mellitus) ([place])   . Mitral valve prolapse     a. Not noted on 03/2015 Echo.  Marland Kitchen COPD (chronic obstructive pulmonary disease) ([place])   . Palpitations   . Osteoarthritis   . Systemic lupus erythematosus ([place])   . Lumbar spondylolysis   . Raynaud's disease   . Anxiety   . IBS (irritable bowel syndrome)   . Uveitis, anterior     ????  . Rheumatic fever   . Ankylosing spondylitis ([place])     ? how diagnosed  . Insomnia, unspecified   . Chronic peptic ulcer, unspecified site, without mention of hemorrhage, perforation, or obstruction   . History of kidney stones   . Chronic kidney infection   . Asthma     as child  . Hypothyroidism     mass, U/S 6/21 - nodules  . Motion sickness     car - back seat  . GERD (gastroesophageal reflux disease)   . Wears dentures     full upper, partial lower  . Wears contact lenses   . COPD (chronic obstructive pulmonary disease) ([place])     a.Uses 2l prn - followed by Dr. Raul Del.  . Raynaud disease   . Headache   . Neuropathy ([place])   . Wheezing   . Chronic diastolic CHF (congestive heart failure) ([place])     a. 03/2015 Echo: EF 55-60%, Gr 1 DD.  Marland Kitchen PAF (paroxysmal atrial fibrillation) ([place])     a. 03/2015 in setting of hip Fx->Amio/Xarelo;  b. CHA2DS2VASc= 4.  . Left displaced femoral neck fracture ([place])     a. 03/2015 s/p L hemiarthroplasty.  . Diabetes mellitus [facility])       Past Surgical History  Procedure Laterality Date  . Esophagogastroduodenoscopy  multiple  . Colonoscopy  multiple  . Total abdominal hysterectomy w/  bilateral salpingoophorectomy  1990  . Breast biopsy    . Biopsy thyroid    . Esophagogastroduodenoscopy (egd) with propofol N/A 12/11/2014    Procedure: ESOPHAGOGASTRODUODENOSCOPY (EGD) WITH PROPOFOL;  Surgeon: [name], MD;  Location: [facility];  Service: Endoscopy;  Laterality: N/A;  cytology brushing  . Cataract extraction w/phaco Left 03/23/2015    Procedure: CATARACT EXTRACTION PHACO AND INTRAOCULAR LENS PLACEMENT (IOC);  Surgeon: Birder Robson, MD;  Location: [facility];  Service: Ophthalmology;  Laterality: Left;  [place] 00:51   . Hip arthroplasty Left 03/26/2015    Procedure: ARTHROPLASTY BIPOLAR HIP (HEMIARTHROPLASTY);  Surgeon: Earnestine Leys, MD;  Location: [facility];  Service: Orthopedics;  Laterality: Left;  . Cataract extraction w/phaco Right 05/25/2015    Procedure: CATARACT EXTRACTION PHACO AND INTRAOCULAR LENS PLACEMENT (IOC);  Surgeon: Birder Robson, MD;  Location: [facility];  Service: Ophthalmology;  Laterality: Right;  [place] 00:57   . Joint replacement Left   . Hip  arthroplasty Right 11/06/2015    Procedure: ARTHROPLASTY BIPOLAR HIP (HEMIARTHROPLASTY);  Surgeon: Thornton Park, MD;  Location: [facility];  Service: Orthopedics;  Laterality: Right;    in for   Chief Complaint  Patient presents with  . Fatigue  . Pressure Ulcer     HPI  [name]  is a 80 y.o. female, Chronic wound ulceration on the posterior right heel. [facility] has not been very responsive. Patient has been utilizing daily dressing changes with Santyl but has a hard time keeping the pressure off the heels since she refuses to wear the heel protector boots and may try to suspend the heels she moves her feet and legs to the point where she still gets pressure from the bed.. Has persistent stay open and cause problems. Recently admitted because of some pneumonia.  Social History Social History  Substance Use Topics  . Smoking status: Former  Smoker -- 0.25 packs/day for 40 years    Types: Cigarettes    Quit date: 10/11/2011  . Smokeless tobacco: Never Used     Comment: Quit 3 years  . Alcohol Use: No     Family History Family History  Problem Relation Age of Onset  . Kidney disease Brother   . Heart disease Mother   . Hypothyroidism Mother   . Diabetes Mother   . Heart attack Mother   . Hypertension Mother   . Heart disease Father   . Heart attack Father   . Breast cancer Sister   . Hypothyroidism Sister   . Diabetes Sister   . Ovarian cancer Sister   . Diabetes Brother   . Prostate cancer Brother   . Hypothyroidism Daughter      Prior to Admission medications   Medication Sig Start Date End Date Taking? Authorizing Provider  albuterol (PROVENTIL HFA;VENTOLIN HFA) 108 (90 Base) MCG/ACT inhaler Inhale 2 puffs into the lungs 4 (four) times daily as needed for wheezing or shortness of breath.   Yes Historical Provider, MD  albuterol (PROVENTIL) (2.5 MG/3ML) 0.083% nebulizer solution Take 2.5 mg by nebulization every 6 (six) hours as needed for wheezing or shortness of breath. Reported on 07/09/2015   Yes Historical Provider, MD  amiodarone (PACERONE) 200 MG tablet Take 1 tablet (200 mg total) by mouth daily. 06/22/15  Yes Glean Hess, MD  docusate sodium (COLACE) 100 MG capsule Take 1 capsule (100 mg total) by mouth 2 (two) times daily. 11/10/15  Yes Srikar Sudini, MD  ferrous sulfate 325 (65 FE) MG tablet Take 1 tablet (325 mg total) by mouth 2 (two) times daily with a meal. 11/10/15  Yes Srikar Sudini, MD  JANUVIA 100 MG tablet Take 0.5 tablets (50 mg total) by mouth daily. 06/22/15  Yes Glean Hess, MD  LORazepam (ATIVAN) 1 MG tablet Take 1 tablet (1 mg total) by mouth at bedtime. 11/10/15  Yes Srikar Sudini, MD  memantine (NAMENDA) 5 MG tablet Take 5 mg by mouth 2 (two) times daily.   Yes Historical Provider, MD  metFORMIN (GLUCOPHAGE-XR) 500 MG 24 hr tablet Take 1 tablet (500 mg total) by mouth daily before  supper. 12/20/15  Yes Glean Hess, MD  metoprolol tartrate (LOPRESSOR) 25 MG tablet Take 25 mg by mouth 2 (two) times daily.   Yes Historical Provider, MD  mometasone-formoterol (DULERA) 100-5 MCG/ACT AERO Inhale 2 puffs into the lungs 2 (two) times daily. 07/16/15  Yes Glean Hess, MD  oxyCODONE (OXY  IR/ROXICODONE) 5 MG immediate release tablet Take 1 tablet (5 mg total) by mouth every 4 (four) hours as needed for severe pain ((for MODERATE breakthrough pain)). 11/10/15  Yes Srikar Sudini, MD  Probiotic Product (ALIGN) 4 MG CAPS Take 1 capsule (4 mg total) by mouth daily. 06/22/15  Yes Glean Hess, MD  ranitidine (ZANTAC) 150 MG tablet Take 1 tablet (150 mg total) by mouth 2 (two) times daily. 06/22/15  Yes Glean Hess, MD  rivaroxaban (XARELTO) 20 MG TABS tablet Take 1 tablet (20 mg total) by mouth daily. 06/22/15  Yes Glean Hess, MD  senna (SENOKOT) 8.6 MG TABS tablet Take 1 tablet by mouth 2 (two) times daily.   Yes Historical Provider, MD  tiotropium (SPIRIVA) 18 MCG inhalation capsule Place 1 capsule (18 mcg total) into inhaler and inhale daily. 06/22/15  Yes Glean Hess, MD  feeding supplement, ENSURE ENLIVE, (ENSURE ENLIVE) LIQD Take 237 mLs by mouth 2 (two) times daily between meals. 11/10/15   Hillary Bow, MD    Anti-infectives    Start     Dose/Rate Route Frequency Ordered Stop   12/22/15 1615  clindamycin (CLEOCIN) IVPB 600 mg     600 mg 100 mL/hr over 30 Minutes Intravenous Every 8 hours 12/22/15 1601        Scheduled Meds: . amiodarone  200 mg Oral Daily  . clindamycin (CLEOCIN) IV  600 mg Intravenous Q8H  . collagenase   Topical Daily  . docusate sodium  100 mg Oral BID  . [START ON 12/25/2015] famotidine (PEPCID) IV  20 mg Intravenous Q24H  . insulin aspart  0-15 Units Subcutaneous TID WC  . insulin aspart  0-5 Units Subcutaneous QHS  . insulin glargine  10 Units Subcutaneous Daily  . metoprolol tartrate  25 mg Oral BID  . mometasone-formoterol  2  puff Inhalation BID  . rivaroxaban  15 mg Oral Q supper  . senna  1 tablet Oral BID  . sodium chloride flush  3 mL Intravenous Q12H  . sucralfate  1 g Oral TID WC  . tiotropium  18 mcg Inhalation Daily   Allergies  Allergen Reactions  . Codeine Nausea And Vomiting  . Penicillins Hives    Has patient had a PCN reaction causing immediate rash, facial/tongue/throat swelling, SOB or lightheadedness with hypotension: no  Has patient had a PCN reaction causing severe rash involving mucus membranes or skin necrosis: no  Has patient had a PCN reaction that required hospitalization: no  Has patient had a PCN reaction occurring within the last 10 years: no  If all of the above answers are "NO", then may proceed with Cephalosporin use.   . Sulfa Antibiotics   . Doxycycline Rash  . Erythromycin Rash  . Latex Rash    Physical Exam  Vitals  Blood pressure 130/46, pulse 71, temperature 98.3 F (36.8 C), temperature source Oral, resp. rate 18, height 5\' 4"  (1.626 m), weight 49.896 kg (110 lb), SpO2 98 %.  Lower Extremity exam:Right heel ulcer approximately 3 x 2 cm has a central area of necrotic tissue but has quite a bit of granular tissue on the periphery as well. No significant cellulitis is noted to the region. With debridement utilizing a rongeurs, removal the the nonviable tissue from the very central portion the wound shows that there is a deep penetration at that central portion down to the fatty tissue. Probing does not appear to go to the bone but is difficult to tell if  any tendon involvement at this point.   Data Review  CBC  Recent Labs Lab 12/22/15 1203 12/23/15 0507 12/24/15 0753  WBC 17.0* 17.7* 8.0  HGB 11.0* 10.3* 9.7*  HCT 32.9* 31.6* 30.0*  PLT 232 214 194  MCV 86.5 88.4 88.0  MCH 28.8 28.9 28.6  MCHC 33.3 32.7 32.5  RDW 15.4* 15.2* 15.2*  LYMPHSABS 0.7*  --   --   MONOABS 1.4*  --   --   EOSABS 0.0  --   --   BASOSABS 0.1  --   --     ------------------------------------------------------------------------------------------------------------------  Chemistries   Recent Labs Lab 12/22/15 1203 12/23/15 0507  NA 135 138  K 4.0 3.5  CL 98* 104  CO2 30 26  GLUCOSE 283* 195*  BUN 15 17  CREATININE 0.73 0.61  CALCIUM 9.0 8.7*  AST 53*  --   ALT 34  --   ALKPHOS 103  --   BILITOT 0.6  --      Imaging results:   Mr Brain Wo Contrast  12/23/2015  CLINICAL DATA:  Altered mental status and somnolence.  Incontinence. EXAM: MRI HEAD WITHOUT CONTRAST TECHNIQUE: Multiplanar, multiecho pulse sequences of the brain and surrounding structures were obtained without intravenous contrast. COMPARISON:  CT 12/22/2015.  MRI 07/01/2015. FINDINGS: Diffusion imaging does not show any acute or subacute infarction. There chronic small-vessel ischemic changes throughout the pons. No cerebellar insult. Cerebral hemispheres show atrophy and chronic small vessel ischemic change throughout the deep and subcortical white matter. No cortical or large vessel territory infarction. No intra-axial mass lesion, hemorrhage, hydrocephalus or subdural collection. Small left frontal arachnoid cyst is unchanged and not significant. No pituitary mass. No inflammatory sinus disease. IMPRESSION: No acute or reversible finding. Atrophy and chronic small-vessel ischemic changes throughout the brain. No change in an insignificant left frontal arachnoid cyst. Electronically Signed   By: Nelson Chimes M.D.   On: 12/23/2015 13:14   Dg Chest Port 1 View  12/23/2015  CLINICAL DATA:  Follow-up right upper lobe infiltrate EXAM: PORTABLE CHEST 1 VIEW COMPARISON:  12/22/2015 FINDINGS: Cardiac shadow is stable. The lungs are well aerated bilaterally. Persistent right upper lobe infiltrate is noted. No significant interval change is seen. No new focal infiltrate is noted no bony abnormality is seen. IMPRESSION: Stable right upper lobe infiltrate. Electronically Signed   By:  [place] Catalina M.D.   On: 12/23/2015 07:10    Assessment & Plan: Mostly granular posterior heel wound with some centralized nonviable moist necrosis which was removed today utilizing Rogers. This was cultured as deep level and I will see if the culture and sensitivities are positive. There is no overt evidence of any infection at this point and I will see if the cultures positive for a proceed with an MRI. We will apply a wet-to-dry dressing on a regular basis but will also probably consider some calcium allergen a dressing to that central portion of the wound in order to promote removal of the drainage from that area. She scheduled for an angioplasty on Monday. Once I get some results from these cultures on the need to get an MRI or do further debridement.  Principal Problem:   Acute respiratory distress (HCC) Active Problems:   Chronic diastolic CHF (congestive heart failure) (HCC)   Paroxysmal atrial fibrillation (HCC)   Pneumonia   Pressure ulcer   Demand ischemia ([place])   Metabolic encephalopathy   Sepsis (HCC)    Perry Mount M.D on 12/24/2015 at 2:48 PM  Thank you for the consult, we will follow the patient with you in the Hospital.

## 2015-12-24 NOTE — Progress Notes (Addendum)
Inpatient Diabetes Program Recommendations  AACE/ADA: New Consensus Statement on Inpatient Glycemic Control (2015)  Target Ranges:  Prepandial:   less than 140 mg/dL      Peak postprandial:   less than 180 mg/dL (1-2 hours)      Critically ill patients:  140 - 180 mg/dL   Lab Results  Component Value Date   GLUCAP 335* 12/24/2015   HGBA1C 7.6* 12/22/2015    Review of Glycemic Control  Results for NAILANI, BURZINSKI (MRN [id number]) as of 12/24/2015 12:04  Ref. Range 12/23/2015 12:05 12/23/2015 16:13 12/23/2015 22:17 12/24/2015 07:27 12/24/2015 11:05  Glucose-Capillary Latest Ref Range: 65-99 mg/dL 276 (H) 259 (H) 256 (H) 222 (H) 335 (H)   Diabetes history: Type 2 Outpatient Diabetes medications: Januvia 50 mg daily, Metformin 500 mg with supper Current orders for Inpatient glycemic control: Novolog resistant scale  tid with meals and  Novolog 0-5 units HS  Inpatient Diabetes Program Recommendations: Consider starting low dose basal insulin Lantus10 units q day starting now and decrease Novolog correction to moderate correction scale 0-15 units tid.  Gentry Fitz, RN, BA, MHA, CDE Diabetes Coordinator Inpatient Diabetes Program  [phone] (Team Pager) [phone] ([place]) 12/24/2015 12:15 PM

## 2015-12-24 NOTE — Consult Note (Signed)
WOC wound consult note Reason for Consult:CHronic nonhealing pressure injury to right heel.  Consult pending with vascular dept.   Wound type:Chronic unstagebale pressure injury, with vascular/neuropathic  etiology as well.  Pressure Ulcer POA: Yes Measurement:Right heel:  2 cm x 2 cm x 0.3 cm wound bed is obscured with adherent, yellow slough Wound bed:100% devitalized tissue.  Right dorsal foot near malleolus with two nonintact lesions, 0.5 cm in diameter Drainage (amount, consistency, odor) Minimal serosanguinous drainage.  Musty odor.   Periwound:Erythema Dressing procedure/placement/frequency:Cleanse wounds to right heel and right dorsal foot with NS and pat gently dry.  Apply Santyl ointment to wound bed.  Cover with NS moist gauze.  Cover with 4x4 gauze and secure with kerlix. Change daily.  Will not follow at this time.  Please re-consult if needed.  Domenic Moras RN BSN [place] Pager [phone]

## 2015-12-24 NOTE — Evaluation (Signed)
Physical Therapy Evaluation Patient Details Name: [name] MRN: [id number] DOB: 06-16-1935 Today's Date: 12/24/2015   History of Present Illness  [name] is a 80 y.o. female with a known history of Multiple medical problems including diabetes mellitus, COPD not on home oxygen, SLE, Raynaud, rheumatic fever, hypertension, peptic ulcer disease, paroxysmal atrial fibrillation on xarelto, recent admission for fall and left hip fracture in October 2016 a few months later had a fall with right femoral neck fracture and hemi 11/06/15.  Pt with R heel ulceration, likely having a debridment and possible procedure next week.   Clinical Impression  Pt struggles to meaningfully/consistently follow along with PT exam and though she is able to do some limited mobility and closely guarded ambulation (with chair following) it is difficult to get her true baseline status established.  She initially shows poor sitting balance and needs assist to stay safe with this as well as with initial standing.  Pt leaning back heavily and despite much cuing needs a lot of direct physical assist to use the walker properly and maintain upright.  Pt could potentially benefit from STR, but husband would rather take her home and continue with the HHPT that she was receiving.      Follow Up Recommendations Home health PT;Supervision/Assistance - 24 hour (husband does not want her to go to rehab)    Equipment Recommendations       Recommendations for Other Services       Precautions / Restrictions Precautions Precautions: Fall Restrictions Weight Bearing Restrictions: No Other Position/Activity Restrictions: R posterior calcaneal ulcer, no pressure/WBing on back of heel      Mobility  Bed Mobility Overal bed mobility: Needs Assistance Bed Mobility: Supine to Sit     Supine to sit: Mod assist     General bed mobility comments: Pt needing direct assist to get LEs to EOB, to get UEs to use rail  appropriately and to lift trunk to EOB and maintain balance  Transfers Overall transfer level: Needs assistance Equipment used: Rolling walker (2 wheeled) Transfers: Sit to/from Stand Sit to Stand: Mod assist;Max assist         General transfer comment: Pt shows good effort, but is unable to lift her self effectively and once in "standing" she is leaning heavily with back of LEs on bed and needs assist to keep hips forward.   Ambulation/Gait Ambulation/Gait assistance: Min assist Ambulation Distance (Feet): 35 Feet Assistive device: Rolling walker (2 wheeled)       General Gait Details: Pt intially unable to keep weight forward and needs direct assist to get to/maintain standing.  She shows good effort but ultimately takes very short, shuffling steps, and generally needs guarding and assist most of the time for safety.   Stairs            Wheelchair Mobility    Modified Rankin (Stroke Patients Only)       Balance Overall balance assessment: Needs assistance   Sitting balance-Leahy Scale: Poor       Standing balance-Leahy Scale: Poor                               Pertinent Vitals/Pain Pain Assessment:  (not rated, she does not appear to be pain limited)    Home Living Family/patient expects to be discharged to:: Private residence Living Arrangements: Spouse/significant other Available Help at Discharge: Family Type of Home: House Home Access: Stairs to enter  Entrance Stairs-Rails: None Entrance Stairs-Number of Steps: 1 Home Layout: Two level;Able to live on main level with bedroom/bathroom Home Equipment: Bedside commode;Walker - 2 wheels;Transport chair;Shower seat      Prior Function Level of Independence: Needs assistance   Gait / Transfers Assistance Needed: apparently pt is able to do in home walking (sometimes w/o AD) but husband is always with her and helps her with most activities     Comments: apparently pt has been having  infrequent but more falls     Hand Dominance        Extremity/Trunk Assessment   Upper Extremity Assessment: Generalized weakness (mental status makes formal testing difficult)           Lower Extremity Assessment: Generalized weakness (mental status makes formal testing difficult)         Communication   Communication: Expressive difficulties;Receptive difficulties  Cognition Arousal/Alertness: Awake/alert Behavior During Therapy: Impulsive Overall Cognitive Status: History of cognitive impairments - at baseline                      General Comments      Exercises        Assessment/Plan    PT Assessment Patient needs continued PT services  PT Diagnosis Difficulty walking;Generalized weakness   PT Problem List Decreased strength;Decreased safety awareness;Decreased knowledge of use of DME;Decreased cognition;Decreased mobility;Decreased balance;Decreased activity tolerance  PT Treatment Interventions DME instruction;Gait training;Therapeutic activities;Therapeutic exercise;Functional mobility training;Balance training;Cognitive remediation;Patient/family education   PT Goals (Current goals can be found in the Care Plan section) Acute Rehab PT Goals Patient Stated Goal: husband reports he wants her to gradually get her strength back  PT Goal Formulation: With patient/family Time For Goal Achievement: 01/07/16 Potential to Achieve Goals: Fair    Frequency Min 2X/week   Barriers to discharge        Co-evaluation               End of Session Equipment Utilized During Treatment: Gait belt Activity Tolerance: Patient limited by fatigue (confusion makes some activities difficult) Patient left: with call bell/phone within reach;with chair alarm set;with family/visitor present           Time: [id number] PT Time Calculation (min) (ACUTE ONLY): 35 min   Charges:   PT Evaluation $PT Eval Low Complexity: 1 Procedure     PT G CodesKreg Shropshire, DPT 12/24/2015, 1:48 PM

## 2015-12-24 NOTE — Care Management Important Message (Signed)
Important Message  Patient Details  Name: [name] MRN: [id number] Date of Birth: 1936-05-03   Medicare Important Message Given:  Yes    Juliann Pulse A Makalya Nave 12/24/2015, 1:26 PM

## 2015-12-25 LAB — GLUCOSE, CAPILLARY
GLUCOSE-CAPILLARY: 125 mg/dL — AB (ref 65–99)
GLUCOSE-CAPILLARY: 172 mg/dL — AB (ref 65–99)
GLUCOSE-CAPILLARY: 69 mg/dL (ref 65–99)
GLUCOSE-CAPILLARY: 62 mg/dL — AB (ref 65–99)
Glucose-Capillary: 116 mg/dL — ABNORMAL HIGH (ref 65–99)
Glucose-Capillary: 90 mg/dL (ref 65–99)

## 2015-12-25 MED ORDER — IPRATROPIUM-ALBUTEROL 0.5-2.5 (3) MG/3ML IN SOLN: 3.0000 mL | RESPIRATORY_TRACT | Status: DC | PRN

## 2015-12-25 NOTE — Progress Notes (Signed)
Patient Demographics  [name], is a 80 y.o. female   MRN: [id number]   DOB - [birth year]  Admit Date - 12/22/2015    Outpatient Primary MD for the patient is Halina Maidens, MD  Consult requested in the Hospital by Lytle Butte, MD, On 12/25/2015     With History of -  Past Medical History  Diagnosis Date  . DM (diabetes mellitus) ([place])   . Mitral valve prolapse     a. Not noted on 03/2015 Echo.  Marland Kitchen COPD (chronic obstructive pulmonary disease) ([place])   . Palpitations   . Osteoarthritis   . Systemic lupus erythematosus ([place])   . Lumbar spondylolysis   . Raynaud's disease   . Anxiety   . IBS (irritable bowel syndrome)   . Uveitis, anterior     ????  . Rheumatic fever   . Ankylosing spondylitis ([place])     ? how diagnosed  . Insomnia, unspecified   . Chronic peptic ulcer, unspecified site, without mention of hemorrhage, perforation, or obstruction   . History of kidney stones   . Chronic kidney infection   . Asthma     as child  . Hypothyroidism     mass, U/S 6/21 - nodules  . Motion sickness     car - back seat  . GERD (gastroesophageal reflux disease)   . Wears dentures     full upper, partial lower  . Wears contact lenses   . COPD (chronic obstructive pulmonary disease) ([place])     a.Uses 2l prn - followed by Dr. Raul Del.  . Raynaud disease   . Headache   . Neuropathy ([place])   . Wheezing   . Chronic diastolic CHF (congestive heart failure) ([place])     a. 03/2015 Echo: EF 55-60%, Gr 1 DD.  Marland Kitchen PAF (paroxysmal atrial fibrillation) ([place])     a. 03/2015 in setting of hip Fx->Amio/Xarelo;  b. CHA2DS2VASc= 4.  . Left displaced femoral neck fracture ([place])     a. 03/2015 s/p L hemiarthroplasty.  . Diabetes mellitus [facility])       Past Surgical History  Procedure Laterality Date  .  Esophagogastroduodenoscopy  multiple  . Colonoscopy  multiple  . Total abdominal hysterectomy w/ bilateral salpingoophorectomy  1990  . Breast biopsy    . Biopsy thyroid    . Esophagogastroduodenoscopy (egd) with propofol N/A 12/11/2014    Procedure: ESOPHAGOGASTRODUODENOSCOPY (EGD) WITH PROPOFOL;  Surgeon: [name], MD;  Location: [place];  Service: Endoscopy;  Laterality: N/A;  cytology brushing  . Cataract extraction w/phaco Left 03/23/2015    Procedure: CATARACT EXTRACTION PHACO AND INTRAOCULAR LENS PLACEMENT (IOC);  Surgeon: Birder Robson, MD;  Location: [facility];  Service: Ophthalmology;  Laterality: Left;  [place] 00:51   . Hip arthroplasty Left 03/26/2015    Procedure: ARTHROPLASTY BIPOLAR HIP (HEMIARTHROPLASTY);  Surgeon: Earnestine Leys, MD;  Location: [facility];  Service: Orthopedics;  Laterality: Left;  . Cataract extraction w/phaco Right 05/25/2015    Procedure: CATARACT EXTRACTION PHACO AND INTRAOCULAR LENS PLACEMENT (IOC);  Surgeon: Birder Robson, MD;  Location: [facility];  Service: Ophthalmology;  Laterality: Right;  [place] 00:57   . Joint replacement Left   .  Hip arthroplasty Right 11/06/2015    Procedure: ARTHROPLASTY BIPOLAR HIP (HEMIARTHROPLASTY);  Surgeon: Thornton Park, MD;  Location: [facility];  Service: Orthopedics;  Laterality: Right;    in for   Chief Complaint  Patient presents with  . Fatigue  . Pressure Ulcer     HPI  [name]  is a 80 y.o. female, And hospitalized for metabolic encephalopathy. Also has a long-standing chronic decubitus ulcer on her right heel. She has stabilized somewhat in the Hospital and I have debrided the posterior yesterday to remove some supplies devitalized tissue. Wound does go down to the fatty tissue on that heel. Has no overt signs of infection to the region. Deep tissue was cultured.    Social History Social History  Substance Use Topics  . Smoking status: Former Smoker -- 0.25 packs/day for 40 years     Types: Cigarettes    Quit date: 10/11/2011  . Smokeless tobacco: Never Used     Comment: Quit 3 years  . Alcohol Use: No    Family History Family History  Problem Relation Age of Onset  . Kidney disease Brother   . Heart disease Mother   . Hypothyroidism Mother   . Diabetes Mother   . Heart attack Mother   . Hypertension Mother   . Heart disease Father   . Heart attack Father   . Breast cancer Sister   . Hypothyroidism Sister   . Diabetes Sister   . Ovarian cancer Sister   . Diabetes Brother   . Prostate cancer Brother   . Hypothyroidism Daughter     Prior to Admission medications   Medication Sig Start Date End Date Taking? Authorizing Provider  albuterol (PROVENTIL HFA;VENTOLIN HFA) 108 (90 Base) MCG/ACT inhaler Inhale 2 puffs into the lungs 4 (four) times daily as needed for wheezing or shortness of breath.   Yes Historical Provider, MD  albuterol (PROVENTIL) (2.5 MG/3ML) 0.083% nebulizer solution Take 2.5 mg by nebulization every 6 (six) hours as needed for wheezing or shortness of breath. Reported on 07/09/2015   Yes Historical Provider, MD  amiodarone (PACERONE) 200 MG tablet Take 1 tablet (200 mg total) by mouth daily. 06/22/15  Yes Glean Hess, MD  docusate sodium (COLACE) 100 MG capsule Take 1 capsule (100 mg total) by mouth 2 (two) times daily. 11/10/15  Yes Srikar Sudini, MD  ferrous sulfate 325 (65 FE) MG tablet Take 1 tablet (325 mg total) by mouth 2 (two) times daily with a meal. 11/10/15  Yes Srikar Sudini, MD  JANUVIA 100 MG tablet Take 0.5 tablets (50 mg total) by mouth daily. 06/22/15  Yes Glean Hess, MD  LORazepam (ATIVAN) 1 MG tablet Take 1 tablet (1 mg total) by mouth at bedtime. 11/10/15  Yes Srikar Sudini, MD  memantine (NAMENDA) 5 MG tablet Take 5 mg by mouth 2 (two) times daily.   Yes Historical Provider, MD  metFORMIN (GLUCOPHAGE-XR) 500 MG 24 hr tablet Take 1 tablet (500 mg total) by mouth daily before supper. 12/20/15  Yes Glean Hess, MD   metoprolol tartrate (LOPRESSOR) 25 MG tablet Take 25 mg by mouth 2 (two) times daily.   Yes Historical Provider, MD  mometasone-formoterol (DULERA) 100-5 MCG/ACT AERO Inhale 2 puffs into the lungs 2 (two) times daily. 07/16/15  Yes Glean Hess, MD  oxyCODONE (OXY IR/ROXICODONE) 5 MG immediate release tablet Take 1 tablet (5 mg total) by mouth every 4 (four) hours as needed for severe pain ((for MODERATE breakthrough pain)). 11/10/15  Yes Hillary Bow, MD  Probiotic Product (ALIGN) 4 MG CAPS Take 1 capsule (4 mg total) by mouth daily. 06/22/15  Yes Glean Hess, MD  ranitidine (ZANTAC) 150 MG tablet Take 1 tablet (150 mg total) by mouth 2 (two) times daily. 06/22/15  Yes Glean Hess, MD  rivaroxaban (XARELTO) 20 MG TABS tablet Take 1 tablet (20 mg total) by mouth daily. 06/22/15  Yes Glean Hess, MD  senna (SENOKOT) 8.6 MG TABS tablet Take 1 tablet by mouth 2 (two) times daily.   Yes Historical Provider, MD  tiotropium (SPIRIVA) 18 MCG inhalation capsule Place 1 capsule (18 mcg total) into inhaler and inhale daily. 06/22/15  Yes Glean Hess, MD  feeding supplement, ENSURE ENLIVE, (ENSURE ENLIVE) LIQD Take 237 mLs by mouth 2 (two) times daily between meals. 11/10/15   Hillary Bow, MD    Anti-infectives    Start     Dose/Rate Route Frequency Ordered Stop   12/22/15 1615  clindamycin (CLEOCIN) IVPB 600 mg     600 mg 100 mL/hr over 30 Minutes Intravenous Every 8 hours 12/22/15 1601        Scheduled Meds: . amiodarone  200 mg Oral Daily  . clindamycin (CLEOCIN) IV  600 mg Intravenous Q8H  . collagenase   Topical Daily  . docusate sodium  100 mg Oral BID  . famotidine (PEPCID) IV  20 mg Intravenous Q24H  . insulin aspart  0-15 Units Subcutaneous TID WC  . insulin aspart  0-5 Units Subcutaneous QHS  . insulin glargine  10 Units Subcutaneous Daily  . metoprolol tartrate  25 mg Oral BID  . mometasone-formoterol  2 puff Inhalation BID  . rivaroxaban  15 mg Oral Q supper  .  senna  1 tablet Oral BID  . sodium chloride flush  3 mL Intravenous Q12H  . sucralfate  1 g Oral TID WC  . tiotropium  18 mcg Inhalation Daily   Continuous Infusions:  PRN Meds:.acetaminophen **OR** acetaminophen, ipratropium-albuterol, morphine injection, ondansetron (ZOFRAN) IV  Allergies  Allergen Reactions  . Codeine Nausea And Vomiting  . Penicillins Hives    Has patient had a PCN reaction causing immediate rash, facial/tongue/throat swelling, SOB or lightheadedness with hypotension: no  Has patient had a PCN reaction causing severe rash involving mucus membranes or skin necrosis: no  Has patient had a PCN reaction that required hospitalization: no  Has patient had a PCN reaction occurring within the last 10 years: no  If all of the above answers are "NO", then may proceed with Cephalosporin use.   . Sulfa Antibiotics   . Doxycycline Rash  . Erythromycin Rash  . Latex Rash    Physical Exam  Vitals  Blood pressure 141/52, pulse 77, temperature 98.2 F (36.8 C), temperature source Axillary, resp. rate 28, height 5\' 4"  (1.626 m), weight 49.896 kg (110 lb), SpO2 97 %.  Lower Extremity exam:Posterior right heel ulcer checked again the day good granular tissue looks apparently fairly healthy centrally the necrotic tissue was removed yesterday. That area appears to be clean but there is heavy drainage from the wound. No cellulitis or progressive redness to the area. Tried to keep the heels from any contact at this timeframe. Data Review  CBC  Recent Labs Lab 12/22/15 1203 12/23/15 0507 12/24/15 0753  WBC 17.0* 17.7* 8.0  HGB 11.0* 10.3* 9.7*  HCT 32.9* 31.6* 30.0*  PLT 232 214 194  MCV 86.5 88.4 88.0  MCH 28.8 28.9 28.6  MCHC 33.3  32.7 32.5  RDW 15.4* 15.2* 15.2*  LYMPHSABS 0.7*  --   --   MONOABS 1.4*  --   --   EOSABS 0.0  --   --   BASOSABS 0.1  --   --     ------------------------------------------------------------------------------------------------------------------  Chemistries   Recent Labs Lab 12/22/15 1203 12/23/15 0507  NA 135 138  K 4.0 3.5  CL 98* 104  CO2 30 26  GLUCOSE 283* 195*  BUN 15 17  CREATININE 0.73 0.61  CALCIUM 9.0 8.7*  AST 53*  --   ALT 34  --   ALKPHOS 103  --   BILITOT 0.6  --    ------------------------------------------------------------------------------------------------------------------ estimated creatinine clearance is 46.4 mL/min (by C-G formula based on Cr of 0.61). ------------------------------------------------------------------------------------------------------------------ No results for input(s): TSH, T4TOTAL, T3FREE, THYROIDAB in the last 72 hours.  Invalid input(s): FREET3   Coagulation profile No results for input(s): INR, PROTIME in the last 168 hours. ------------------------------------------------------------------------------------------------------------------- No results for input(s): DDIMER in the last 72 hours. -------------------------------------------------------------------------------------------------------------------  Cardiac Enzymes  Recent Labs Lab 12/23/15 0734 12/23/15 1310 12/23/15 1935  TROPONINI 0.10* 0.05* <0.03   ------------------------------------------------------------------------------------------------------------------ Invalid input(s): POCBNP   ---------------------------------------------------------------------------------------------------------------  Urinalysis    Component Value Date/Time   COLORURINE YELLOW* 12/22/2015 1301   COLORURINE Yellow 07/16/2013 1208   APPEARANCEUR CLEAR* 12/22/2015 1301   APPEARANCEUR Hazy* 08/18/2015 1403   APPEARANCEUR Hazy 07/16/2013 1208   LABSPEC 1.022 12/22/2015 1301   LABSPEC 1.028 07/16/2013 1208   PHURINE 6.0 12/22/2015 1301   PHURINE 6.0 07/16/2013 1208   GLUCOSEU >500* 12/22/2015  1301   GLUCOSEU >=500 07/16/2013 1208   HGBUR NEGATIVE 12/22/2015 1301   HGBUR Negative 07/16/2013 1208   BILIRUBINUR NEGATIVE 12/22/2015 1301   BILIRUBINUR Negative 08/18/2015 1403   BILIRUBINUR Negative 07/16/2013 1208   KETONESUR TRACE* 12/22/2015 1301   KETONESUR Trace 07/16/2013 1208   PROTEINUR 30* 12/22/2015 1301   PROTEINUR 1+* 08/18/2015 1403   PROTEINUR 30 mg/dL 07/16/2013 1208   NITRITE NEGATIVE 12/22/2015 1301   NITRITE Negative 08/18/2015 1403   NITRITE Negative 07/16/2013 1208   LEUKOCYTESUR NEGATIVE 12/22/2015 1301   LEUKOCYTESUR Negative 08/18/2015 1403   LEUKOCYTESUR 3+ 07/16/2013 1208     Imaging results:   Mr Brain Wo Contrast  12/23/2015  CLINICAL DATA:  Altered mental status and somnolence.  Incontinence. EXAM: MRI HEAD WITHOUT CONTRAST TECHNIQUE: Multiplanar, multiecho pulse sequences of the brain and surrounding structures were obtained without intravenous contrast. COMPARISON:  CT 12/22/2015.  MRI 07/01/2015. FINDINGS: Diffusion imaging does not show any acute or subacute infarction. There chronic small-vessel ischemic changes throughout the pons. No cerebellar insult. Cerebral hemispheres show atrophy and chronic small vessel ischemic change throughout the deep and subcortical white matter. No cortical or large vessel territory infarction. No intra-axial mass lesion, hemorrhage, hydrocephalus or subdural collection. Small left frontal arachnoid cyst is unchanged and not significant. No pituitary mass. No inflammatory sinus disease. IMPRESSION: No acute or reversible finding. Atrophy and chronic small-vessel ischemic changes throughout the brain. No change in an insignificant left frontal arachnoid cyst. Electronically Signed   By: Nelson Chimes M.D.   On: 12/23/2015 13:14   Assessment & Plan: He'll appears to be stable to this point with the wound is no progressive cellulitis redness or sinus tracking. Plan: Awaiting culture results on that area. She is scheduled to  have an angioplasty tomorrow which may help improve circulation. If culture is positive I may request a bone scan or MRI to check the posterior heel  for potential osteomyelitis. X-rays were negative for any type of osseus change. Change dressing daily apply calcium alginate to the wound along with heavy padded dressing hold off on Santyl) now and just go with the calcium alginate and heavy padded dressing to the region. Will follow again tomorrow.  Principal Problem:   Acute respiratory distress (HCC) Active Problems:   Chronic diastolic CHF (congestive heart failure) (HCC)   Paroxysmal atrial fibrillation (HCC)   Pneumonia   Pressure ulcer   Demand ischemia ([place])   Metabolic encephalopathy   Sepsis (HCC)   Perry Mount M.D on 12/25/2015 at 11:22 AM  Thank you for the consult, we will follow the patient with you in the Hospital.

## 2015-12-25 NOTE — Progress Notes (Signed)
[facility] [place] NAME: [name]    MRN#:  [id number]  DATE OF BIRTH:  30-Sep-1935  SUBJECTIVE:  Hospital Day: 3 days [name] is a 80 y.o. female presenting with Fatigue and Pressure Ulcer .   Overnight events: No overnight events Interval Events: Continued waxing waning mental status, family members at bedside patient without complaints  REVIEW OF SYSTEMS:  CONSTITUTIONAL: No fever,Positive fatigue or weakness.  EYES: No blurred or double vision.  EARS, NOSE, AND THROAT: No tinnitus or ear pain.  RESPIRATORY: No cough, shortness of breath, positive wheezing denies hemoptysis.  CARDIOVASCULAR: No chest pain, orthopnea, edema.  GASTROINTESTINAL: No nausea, vomiting, diarrhea or abdominal pain.  GENITOURINARY: No dysuria, hematuria.  ENDOCRINE: No polyuria, nocturia,  HEMATOLOGY: No anemia, easy bruising or bleeding SKIN: No rash or lesion. MUSCULOSKELETAL: No joint pain or arthritis.   NEUROLOGIC: No tingling, numbness, weakness.  PSYCHIATRY: No anxiety or depression.   DRUG ALLERGIES:   Allergies  Allergen Reactions  . Codeine Nausea And Vomiting  . Penicillins Hives    Has patient had a PCN reaction causing immediate rash, facial/tongue/throat swelling, SOB or lightheadedness with hypotension: no  Has patient had a PCN reaction causing severe rash involving mucus membranes or skin necrosis: no  Has patient had a PCN reaction that required hospitalization: no  Has patient had a PCN reaction occurring within the last 10 years: no  If all of the above answers are "NO", then may proceed with Cephalosporin use.   . Sulfa Antibiotics   . Doxycycline Rash  . Erythromycin Rash  . Latex Rash    VITALS:  Blood pressure 141/52, pulse 77, temperature 98.2 F (36.8 C), temperature source Axillary, resp. rate 28, height 5\' 4"  (1.626 m), weight 110 lb (49.896 kg), SpO2 97 %.  PHYSICAL EXAMINATION:  VITAL  SIGNS: Filed Vitals:   12/24/15 1933 12/25/15 0733  BP: 144/64 141/52  Pulse: 76 77  Temp: 99.1 F (37.3 C) 98.2 F (36.8 C)  Resp: 44    GENERAL:79 y.o.female currently in Minimal acute distress. Given mental status HEAD: Normocephalic, atraumatic.  EYES: Pupils equal, round, reactive to light. Extraocular muscles intact. No scleral icterus.  MOUTH: Moist mucosal membrane. Dentition intact. No abscess noted.  EAR, NOSE, THROAT: Clear without exudates. No external lesions.  NECK: Supple. No thyromegaly. No nodules. No JVD.  PULMONARY: scant left sided expiratory wheeze without rails or rhonci. No use of accessory muscles, Good respiratory effort. good air entry bilaterally CHEST: Nontender to palpation.  CARDIOVASCULAR: S1 and S2. Regular rate and rhythm. No murmurs, rubs, or gallops. No edema.  GASTROINTESTINAL: Soft, nontender, nondistended. No masses. Positive bowel sounds. No hepatosplenomegaly.  MUSCULOSKELETAL: No swelling, clubbing, or edema. Range of motion full in all extremities.  NEUROLOGIC: Cranial nerves II through XII are intact. No gross focal neurological deficits. Sensation intact.  SKIN: Right heel ulcer dressing clean dry and intact No ulceration, lesions, rashes, or cyanosis. Skin warm and dry. Turgor intact.  PSYCHIATRIC: Mood, affect within normal limits. The patient is awake, alert and oriented x 3. Insight, judgment intact.      LABORATORY PANEL:   CBC  Recent Labs Lab 12/24/15 0753  WBC 8.0  HGB 9.7*  HCT 30.0*  PLT 194   ------------------------------------------------------------------------------------------------------------------  Chemistries   Recent Labs Lab 12/22/15 1203 12/23/15 0507  NA 135 138  K 4.0 3.5  CL 98* 104  CO2 30 26  GLUCOSE 283* 195*  BUN  15 17  CREATININE 0.73 0.61  CALCIUM 9.0 8.7*  AST 53*  --   ALT 34  --   ALKPHOS 103  --   BILITOT 0.6  --     ------------------------------------------------------------------------------------------------------------------  Cardiac Enzymes  Recent Labs Lab 12/23/15 1935  TROPONINI <0.03   ------------------------------------------------------------------------------------------------------------------  RADIOLOGY:  Mr Brain Wo Contrast  12/23/2015  CLINICAL DATA:  Altered mental status and somnolence.  Incontinence. EXAM: MRI HEAD WITHOUT CONTRAST TECHNIQUE: Multiplanar, multiecho pulse sequences of the brain and surrounding structures were obtained without intravenous contrast. COMPARISON:  CT 12/22/2015.  MRI 07/01/2015. FINDINGS: Diffusion imaging does not show any acute or subacute infarction. There chronic small-vessel ischemic changes throughout the pons. No cerebellar insult. Cerebral hemispheres show atrophy and chronic small vessel ischemic change throughout the deep and subcortical white matter. No cortical or large vessel territory infarction. No intra-axial mass lesion, hemorrhage, hydrocephalus or subdural collection. Small left frontal arachnoid cyst is unchanged and not significant. No pituitary mass. No inflammatory sinus disease. IMPRESSION: No acute or reversible finding. Atrophy and chronic small-vessel ischemic changes throughout the brain. No change in an insignificant left frontal arachnoid cyst. Electronically Signed   By: Nelson Chimes M.D.   On: 12/23/2015 13:14    EKG:   Orders placed or performed during the hospital encounter of 12/22/15  . EKG 12-Lead  . EKG 12-Lead  . ED EKG  . ED EKG  . EKG 12-Lead  . EKG 12-Lead    ASSESSMENT AND PLAN:   [name] is a 80 y.o. female presenting with Fatigue and Pressure Ulcer . Admitted 12/22/2015 : Day #: 3 days   1, Acute on chronic encephalopathy secondary to pneumonia: With baseline dementia.   MRI of the brain negative for stroke.  Started back on the diet as per speech.  2. aspiration pneumonia patient is on  clindamycin 3. Right heel pressure ulcer: Status post debridement with podiatry, for vascular study, await culture data to determine if MRI is required-per report did not appear infected 4. GERD continue PPIs, Carafate. Episode nausea, vomiting with some specks of blood yesterday but none after that. Watch for further episodes of vomiting and hematochezia. 5 generalized weakness physical therapy recommended rehabilitation placement.  6, Paroxysmal atrial fibrillation: Patient is on Xarelto, amiodarone, metoprolol 7 COPD: Unspecified, add breathing treatments given wheezing Continue Spiriva, . 8. diabetes mellitus type 2: Hold oral agents continue a sliding scale coverage   All the records are reviewed and case discussed with Care Management/Social Workerr. Management plans discussed with the patient, family and they are in agreement.  CODE STATUS: dnr TOTAL TIME TAKING CARE OF THIS PATIENT: 28 minutes.   POSSIBLE D/C IN 1-2DAYS, DEPENDING ON CLINICAL CONDITION.   Hower,  Karenann Cai.D on 12/25/2015 at 10:24 AM  Between 7am to 6pm - Pager - [phone]  After 6pm: House Pager: - [phone]  [facility]  Office  [phone]  CC: Primary care physician; Halina Maidens, MD

## 2015-12-26 LAB — GLUCOSE, CAPILLARY
GLUCOSE-CAPILLARY: 158 mg/dL — AB (ref 65–99)
GLUCOSE-CAPILLARY: 59 mg/dL — AB (ref 65–99)
GLUCOSE-CAPILLARY: 247 mg/dL — AB (ref 65–99)
Glucose-Capillary: 68 mg/dL (ref 65–99)
Glucose-Capillary: 259 mg/dL — ABNORMAL HIGH (ref 65–99)
Glucose-Capillary: 122 mg/dL — ABNORMAL HIGH (ref 65–99)

## 2015-12-26 LAB — CREATININE, SERUM: Creatinine, Ser: 0.52 mg/dL (ref 0.44–1.00)

## 2015-12-26 MED ORDER — GLUCOSE 40 % PO GEL
ORAL | Status: AC
  Filled 2015-12-26: qty 1

## 2015-12-26 MED ORDER — BISACODYL 10 MG RE SUPP
10.0000 mg | Freq: Every day | RECTAL | Status: DC | PRN
  Filled 2015-12-26: qty 1

## 2015-12-26 MED ORDER — GLUCOSE 40 % PO GEL
1.0000 | Freq: Once | ORAL | Status: AC
  Administered 2015-12-26: 37.5 g via ORAL

## 2015-12-26 MED ORDER — ENOXAPARIN SODIUM 40 MG/0.4ML ~~LOC~~ SOLN
40.0000 mg | SUBCUTANEOUS | Status: DC
  Administered 2015-12-28: 40 mg via SUBCUTANEOUS
  Filled 2015-12-26: qty 0.4

## 2015-12-26 MED ORDER — INSULIN GLARGINE 100 UNIT/ML ~~LOC~~ SOLN
5.0000 [IU] | Freq: Every day | SUBCUTANEOUS | Status: DC
  Filled 2015-12-26 (×2): qty 0.05

## 2015-12-26 NOTE — Progress Notes (Signed)
[name] at [place] NAME: [name]    MRN#:  [id number]  DATE OF BIRTH:  Jun 25, 1935  SUBJECTIVE:  Hospital Day: 4 days [name] is a 80 y.o. female presenting with Fatigue and Pressure Ulcer  Has had confusion on and off. Mild. Feels weak today. Eating breakfast.  Husband at bedside.  REVIEW OF SYSTEMS:  CONSTITUTIONAL: No fever,Positive fatigue or weakness.  EYES: No blurred or double vision.  EARS, NOSE, AND THROAT: No tinnitus or ear pain.  RESPIRATORY: No shortness of breath, positive wheezing denies hemoptysis. Cough present CARDIOVASCULAR: No chest pain, orthopnea, edema.  GASTROINTESTINAL: No nausea, vomiting, diarrhea or abdominal pain.  GENITOURINARY: No dysuria, hematuria.  ENDOCRINE: No polyuria, nocturia,  HEMATOLOGY: No anemia, easy bruising or bleeding SKIN: Right foot ulcer MUSCULOSKELETAL: No joint pain or arthritis.   NEUROLOGIC: No tingling, numbness, weakness.  PSYCHIATRY: Dementia  DRUG ALLERGIES:   Allergies  Allergen Reactions  . Codeine Nausea And Vomiting  . Penicillins Hives    Has patient had a PCN reaction causing immediate rash, facial/tongue/throat swelling, SOB or lightheadedness with hypotension: no  Has patient had a PCN reaction causing severe rash involving mucus membranes or skin necrosis: no  Has patient had a PCN reaction that required hospitalization: no  Has patient had a PCN reaction occurring within the last 10 years: no  If all of the above answers are "NO", then may proceed with Cephalosporin use.   . Sulfa Antibiotics   . Doxycycline Rash  . Erythromycin Rash  . Latex Rash    VITALS:  Blood pressure 153/55, pulse 69, temperature 97.7 F (36.5 C), temperature source Oral, resp. rate 19, height 5\' 4"  (1.626 m), weight 49.896 kg (110 lb), SpO2 95 %.  PHYSICAL EXAMINATION:  VITAL SIGNS: Filed Vitals:   12/26/15 0333 12/26/15 0724  BP: 162/67 153/55  Pulse: 69  69  Temp: 98 F (36.7 C) 97.7 F (36.5 C)  Resp: 18 19   GENERAL:79 y.o.female currently in no acute distress.  HEAD: Normocephalic, atraumatic.  EYES: Pupils equal, round, reactive to light. Extraocular muscles intact. No scleral icterus.  MOUTH: Moist mucosal membrane. Dentition intact. No abscess noted.  EAR, NOSE, THROAT: Clear without exudates. No external lesions.  NECK: Supple. No thyromegaly. No nodules. No JVD.  PULMONARY: scant left sided expiratory wheeze without rails or rhonci. No use of accessory muscles, Good respiratory effort. good air entry bilaterally CHEST: Nontender to palpation.  CARDIOVASCULAR: S1 and S2. Regular rate and rhythm. No murmurs, rubs, or gallops. No edema.  GASTROINTESTINAL: Soft, nontender, nondistended. No masses. Positive bowel sounds. No hepatosplenomegaly.  MUSCULOSKELETAL: No swelling, clubbing, or edema. Range of motion full in all extremities.  NEUROLOGIC:  No gross focal neurological deficits. Sensation intact.  SKIN: Right heel ulcer dressing clean dry and intact No rashes, or cyanosis. Skin warm and dry. Turgor intact.  PSYCHIATRIC: Awake and alert  LABORATORY PANEL:   CBC  Recent Labs Lab 12/24/15 0753  WBC 8.0  HGB 9.7*  HCT 30.0*  PLT 194  ------------------------------------------------------------------------------------------------------------------ Chemistries   Recent Labs Lab 12/22/15 1203 12/23/15 0507 12/26/15 0434  NA 135 138  --   K 4.0 3.5  --   CL 98* 104  --   CO2 30 26  --   GLUCOSE 283* 195*  --   BUN 15 17  --   CREATININE 0.73 0.61 0.52  CALCIUM 9.0 8.7*  --   AST 53*  --   --  ALT 34  --   --   ALKPHOS 103  --   --   BILITOT 0.6  --   --    ------------------------------------------------------------------------------------------------------------------  Cardiac Enzymes  Recent Labs Lab 12/23/15 1935  TROPONINI <0.03    ------------------------------------------------------------------------------------------------------------------  RADIOLOGY:  No results found.  EKG:   Orders placed or performed during the hospital encounter of 12/22/15  . EKG 12-Lead  . EKG 12-Lead  . ED EKG  . ED EKG  . EKG 12-Lead  . EKG 12-Lead    ASSESSMENT AND PLAN:   [name] is a 80 y.o. female presenting with Fatigue and Pressure Ulcer . Admitted 12/22/2015 : Day #: 4 days   1, Acute on chronic encephalopathy secondary to pneumonia: With baseline dementia.   MRI of the brain negative for stroke.  Started back on the diet as per speech.  Speech therapy to follow.  2. aspiration pneumonia patient is on clindamycin  3. Right heel pressure ulcer Status post debridement with podiatry, for vascular study tomorrow likely Management per podiatry  Discussed with Dr. Lorenso Courier of vascular surgery. Procedure to be schedule for this week.  4. GERD continue PPIs, Carafate  5 Generalized weakness physical therapy recommended rehabilitation placement.   6 Paroxysmal atrial fibrillation Patient is on Xarelto, amiodarone, metoprolol  Hold Xarelto in anticipation of vascular procedure.  7 COPD Unspecified, add breathing treatments given wheezing Continue Spiriva, .  8. diabetes mellitus type 2 Low blood sugars. We will reduce Lantus to 5 units daily.  Start Lovenox for DVT prophylaxis while Xarelto is held.   All the records are reviewed and case discussed with Care Management/Social Workerr. Management plans discussed with the patient, family and they are in agreement.  CODE STATUS: dnr   TOTAL TIME TAKING CARE OF THIS PATIENT: 35 minutes.   POSSIBLE D/C IN 1-2 DAYS, DEPENDING ON CLINICAL CONDITION.   Hillary Bow R M.D on 12/26/2015 at 9:24 AM  Between 7am to 6pm - Pager - [phone]  After 6pm: House Pager: - [facility]  Office  [phone]  CC: Primary  care physician; Halina Maidens, MD

## 2015-12-26 NOTE — Progress Notes (Signed)
Patient Demographics  [name], is a 80 y.o. female   MRN: [id number]   DOB - July 19, 1935  Admit Date - 12/22/2015    Outpatient Primary MD for the patient is Halina Maidens, MD  Consult requested in the Hospital by Hillary Bow, MD, On 12/26/2015   With History of -  Past Medical History  Diagnosis Date  . DM (diabetes mellitus) ([place])   . Mitral valve prolapse     a. Not noted on 03/2015 Echo.  Marland Kitchen COPD (chronic obstructive pulmonary disease) ([place])   . Palpitations   . Osteoarthritis   . Systemic lupus erythematosus ([place])   . Lumbar spondylolysis   . Raynaud's disease   . Anxiety   . IBS (irritable bowel syndrome)   . Uveitis, anterior     ????  . Rheumatic fever   . Ankylosing spondylitis ([place])     ? how diagnosed  . Insomnia, unspecified   . Chronic peptic ulcer, unspecified site, without mention of hemorrhage, perforation, or obstruction   . History of kidney stones   . Chronic kidney infection   . Asthma     as child  . Hypothyroidism     mass, U/S 6/21 - nodules  . Motion sickness     car - back seat  . GERD (gastroesophageal reflux disease)   . Wears dentures     full upper, partial lower  . Wears contact lenses   . COPD (chronic obstructive pulmonary disease) ([place])     a.Uses 2l prn - followed by Dr. Raul Del.  . Raynaud disease   . Headache   . Neuropathy ([place])   . Wheezing   . Chronic diastolic CHF (congestive heart failure) ([place])     a. 03/2015 Echo: EF 55-60%, Gr 1 DD.  Marland Kitchen PAF (paroxysmal atrial fibrillation) ([place])     a. 03/2015 in setting of hip Fx->Amio/Xarelo;  b. CHA2DS2VASc= 4.  . Left displaced femoral neck fracture ([place])     a. 03/2015 s/p L hemiarthroplasty.  . Diabetes mellitus [facility])       Past Surgical History  Procedure Laterality Date  . Esophagogastroduodenoscopy   multiple  . Colonoscopy  multiple  . Total abdominal hysterectomy w/ bilateral salpingoophorectomy  1990  . Breast biopsy    . Biopsy thyroid    . Esophagogastroduodenoscopy (egd) with propofol N/A 12/11/2014    Procedure: ESOPHAGOGASTRODUODENOSCOPY (EGD) WITH PROPOFOL;  Surgeon: [name], MD;  Location: [facility];  Service: Endoscopy;  Laterality: N/A;  cytology brushing  . Cataract extraction w/phaco Left 03/23/2015    Procedure: CATARACT EXTRACTION PHACO AND INTRAOCULAR LENS PLACEMENT (IOC);  Surgeon: Birder Robson, MD;  Location: [facility];  Service: Ophthalmology;  Laterality: Left;  [place] 00:51   . Hip arthroplasty Left 03/26/2015    Procedure: ARTHROPLASTY BIPOLAR HIP (HEMIARTHROPLASTY);  Surgeon: Earnestine Leys, MD;  Location: [facility];  Service: Orthopedics;  Laterality: Left;  . Cataract extraction w/phaco Right 05/25/2015    Procedure: CATARACT EXTRACTION PHACO AND INTRAOCULAR LENS PLACEMENT (IOC);  Surgeon: Birder Robson, MD;  Location: [facility];  Service: Ophthalmology;  Laterality: Right;  [place] 00:57   . Joint replacement Left   . Hip arthroplasty Right  11/06/2015    Procedure: ARTHROPLASTY BIPOLAR HIP (HEMIARTHROPLASTY);  Surgeon: Thornton Park, MD;  Location: [facility];  Service: Orthopedics;  Laterality: Right;    in for   Chief Complaint  Patient presents with  . Fatigue  . Pressure Ulcer     HPI  [name]  is a 80 y.o. female, Hospitalized for metabolic encephalitis. As decubitus ulcer on posterior right heel with long-standing duration. This was debrided 2 days ago and cultured at that time frame. Still await his cultures. Currently getting daily dressing changes for the heel wound.  Social History Social History  Substance Use Topics  . Smoking status: Former Smoker -- 0.25 packs/day for 40 years    Types: Cigarettes    Quit date: 10/11/2011  . Smokeless tobacco: Never Used     Comment: Quit 3 years  . Alcohol Use: No    Family  History Family History  Problem Relation Age of Onset  . Kidney disease Brother   . Heart disease Mother   . Hypothyroidism Mother   . Diabetes Mother   . Heart attack Mother   . Hypertension Mother   . Heart disease Father   . Heart attack Father   . Breast cancer Sister   . Hypothyroidism Sister   . Diabetes Sister   . Ovarian cancer Sister   . Diabetes Brother   . Prostate cancer Brother   . Hypothyroidism Daughter     Prior to Admission medications   Medication Sig Start Date End Date Taking? Authorizing Provider  albuterol (PROVENTIL HFA;VENTOLIN HFA) 108 (90 Base) MCG/ACT inhaler Inhale 2 puffs into the lungs 4 (four) times daily as needed for wheezing or shortness of breath.   Yes Historical Provider, MD  albuterol (PROVENTIL) (2.5 MG/3ML) 0.083% nebulizer solution Take 2.5 mg by nebulization every 6 (six) hours as needed for wheezing or shortness of breath. Reported on 07/09/2015   Yes Historical Provider, MD  amiodarone (PACERONE) 200 MG tablet Take 1 tablet (200 mg total) by mouth daily. 06/22/15  Yes Glean Hess, MD  docusate sodium (COLACE) 100 MG capsule Take 1 capsule (100 mg total) by mouth 2 (two) times daily. 11/10/15  Yes Srikar Sudini, MD  ferrous sulfate 325 (65 FE) MG tablet Take 1 tablet (325 mg total) by mouth 2 (two) times daily with a meal. 11/10/15  Yes Srikar Sudini, MD  JANUVIA 100 MG tablet Take 0.5 tablets (50 mg total) by mouth daily. 06/22/15  Yes Glean Hess, MD  LORazepam (ATIVAN) 1 MG tablet Take 1 tablet (1 mg total) by mouth at bedtime. 11/10/15  Yes Srikar Sudini, MD  memantine (NAMENDA) 5 MG tablet Take 5 mg by mouth 2 (two) times daily.   Yes Historical Provider, MD  metFORMIN (GLUCOPHAGE-XR) 500 MG 24 hr tablet Take 1 tablet (500 mg total) by mouth daily before supper. 12/20/15  Yes Glean Hess, MD  metoprolol tartrate (LOPRESSOR) 25 MG tablet Take 25 mg by mouth 2 (two) times daily.   Yes Historical Provider, MD  mometasone-formoterol  (DULERA) 100-5 MCG/ACT AERO Inhale 2 puffs into the lungs 2 (two) times daily. 07/16/15  Yes Glean Hess, MD  oxyCODONE (OXY IR/ROXICODONE) 5 MG immediate release tablet Take 1 tablet (5 mg total) by mouth every 4 (four) hours as needed for severe pain ((for MODERATE breakthrough pain)). 11/10/15  Yes Srikar Sudini, MD  Probiotic Product (ALIGN) 4 MG CAPS Take 1 capsule (4 mg total) by mouth daily. 06/22/15  Yes Jesse Sans  Army Melia, MD  ranitidine (ZANTAC) 150 MG tablet Take 1 tablet (150 mg total) by mouth 2 (two) times daily. 06/22/15  Yes Glean Hess, MD  rivaroxaban (XARELTO) 20 MG TABS tablet Take 1 tablet (20 mg total) by mouth daily. 06/22/15  Yes Glean Hess, MD  senna (SENOKOT) 8.6 MG TABS tablet Take 1 tablet by mouth 2 (two) times daily.   Yes Historical Provider, MD  tiotropium (SPIRIVA) 18 MCG inhalation capsule Place 1 capsule (18 mcg total) into inhaler and inhale daily. 06/22/15  Yes Glean Hess, MD  feeding supplement, ENSURE ENLIVE, (ENSURE ENLIVE) LIQD Take 237 mLs by mouth 2 (two) times daily between meals. 11/10/15   Hillary Bow, MD    Anti-infectives    Start     Dose/Rate Route Frequency Ordered Stop   12/22/15 1615  clindamycin (CLEOCIN) IVPB 600 mg     600 mg 100 mL/hr over 30 Minutes Intravenous Every 8 hours 12/22/15 1601        Scheduled Meds: . amiodarone  200 mg Oral Daily  . clindamycin (CLEOCIN) IV  600 mg Intravenous Q8H  . collagenase   Topical Daily  . dextrose      . docusate sodium  100 mg Oral BID  . [START ON 12/27/2015] enoxaparin (LOVENOX) injection  40 mg Subcutaneous Q24H  . famotidine (PEPCID) IV  20 mg Intravenous Q24H  . insulin aspart  0-15 Units Subcutaneous TID WC  . insulin aspart  0-5 Units Subcutaneous QHS  . [START ON 12/27/2015] insulin glargine  5 Units Subcutaneous Daily  . metoprolol tartrate  25 mg Oral BID  . mometasone-formoterol  2 puff Inhalation BID  . senna  1 tablet Oral BID  . sodium chloride flush  3 mL  Intravenous Q12H  . sucralfate  1 g Oral TID WC  . tiotropium  18 mcg Inhalation Daily   Continuous Infusions:  PRN Meds:.acetaminophen **OR** acetaminophen, bisacodyl, ipratropium-albuterol, morphine injection, ondansetron (ZOFRAN) IV  Allergies  Allergen Reactions  . Codeine Nausea And Vomiting  . Penicillins Hives    Has patient had a PCN reaction causing immediate rash, facial/tongue/throat swelling, SOB or lightheadedness with hypotension: no  Has patient had a PCN reaction causing severe rash involving mucus membranes or skin necrosis: no  Has patient had a PCN reaction that required hospitalization: no  Has patient had a PCN reaction occurring within the last 10 years: no  If all of the above answers are "NO", then may proceed with Cephalosporin use.   . Sulfa Antibiotics   . Doxycycline Rash  . Erythromycin Rash  . Latex Rash    Physical Exam  Vitals  Blood pressure 153/55, pulse 69, temperature 97.7 F (36.5 C), temperature source Oral, resp. rate 19, height 5\' 4"  (1.626 m), weight 49.896 kg (110 lb), SpO2 95 %.  Lower Extremity exam:Wound is fairly clean at this point still some heavy drainage from the area. Applied calcium alginate yesterday and will reapply that today. Wound still looks healthy on the periphery. Centrally the wound is a little deeper as noted at time of debridement. No redness or overt cellulitis is noted   Data Review  CBC  Recent Labs Lab 12/22/15 1203 12/23/15 0507 12/24/15 0753  WBC 17.0* 17.7* 8.0  HGB 11.0* 10.3* 9.7*  HCT 32.9* 31.6* 30.0*  PLT 232 214 194  MCV 86.5 88.4 88.0  MCH 28.8 28.9 28.6  MCHC 33.3 32.7 32.5  RDW 15.4* 15.2* 15.2*  LYMPHSABS 0.7*  --   --  MONOABS 1.4*  --   --   EOSABS 0.0  --   --   BASOSABS 0.1  --   --    ------------------------------------------------------------------------------------------------------------------  Chemistries   Recent Labs Lab 12/22/15 1203 12/23/15 0507 12/26/15 0434   NA 135 138  --   K 4.0 3.5  --   CL 98* 104  --   CO2 30 26  --   GLUCOSE 283* 195*  --   BUN 15 17  --   CREATININE 0.73 0.61 0.52  CALCIUM 9.0 8.7*  --   AST 53*  --   --   ALT 34  --   --   ALKPHOS 103  --   --   BILITOT 0.6  --   --    --------- ------------------  Assessment & Plan: Overall wound looks stable still some heavy drainage. Plan: Await culture results. She currently is on Cleocin but is allergic to vancomycin as well as other antibiotics. Continue present heavy dressing on to keep pressure off the region. She had her heels directly on the bed today when I checked her out. Does not have a decubitus on the left heel this point. Would likely slow to heal also because of nutritional status. No evidence of infection but will continue to use the calcium agitate try to help reduce some of the drainage and I will follow her again tomorrow. Recommend continued use of the heavy padded dressing and complete alleviation of pressure across the heel.  Principal Problem:   Acute respiratory distress (HCC) Active Problems:   Chronic diastolic CHF (congestive heart failure) (HCC)   Paroxysmal atrial fibrillation (HCC)   Pneumonia   Pressure ulcer   Demand ischemia (HCC)   Metabolic encephalopathy   Sepsis ([place])     Family Communication: Plan discussed with patient and **   Thank you for the consult, we will follow the patient with you in the Hospital.   Perry Mount M.D on 12/26/2015 at 10:45 AM  Thank you for the consult, we will follow the patient with you in the Hospital.

## 2015-12-27 ENCOUNTER — Inpatient Hospital Stay: Payer: Medicare Other

## 2015-12-27 ENCOUNTER — Encounter
Admission: EM | Disposition: A | Discharge status: Hospice | Payer: Self-pay | Source: Home / Self Care | Attending: Internal Medicine

## 2015-12-27 HISTORY — PX: PERIPHERAL VASCULAR CATHETERIZATION: SHX172C

## 2015-12-27 LAB — BASIC METABOLIC PANEL
Anion gap: 8 (ref 5–15)
BUN: 10 mg/dL (ref 6–20)
CALCIUM: 8.3 mg/dL — AB (ref 8.9–10.3)
CHLORIDE: 97 mmol/L — AB (ref 101–111)
CO2: 32 mmol/L (ref 22–32)
CREATININE: 0.65 mg/dL (ref 0.44–1.00)
GFR calc Af Amer: >60 mL/min (ref >60)
GFR calc non Af Amer: >60 mL/min (ref >60)
GLUCOSE: 197 mg/dL — AB (ref 65–99)
Potassium: 2.7 mmol/L — CL (ref 3.5–5.1)
Sodium: 137 mmol/L (ref 135–145)

## 2015-12-27 LAB — GLUCOSE, CAPILLARY
GLUCOSE-CAPILLARY: 186 mg/dL — AB (ref 65–99)
GLUCOSE-CAPILLARY: 194 mg/dL — AB (ref 65–99)
GLUCOSE-CAPILLARY: 165 mg/dL — AB (ref 65–99)
GLUCOSE-CAPILLARY: 157 mg/dL — AB (ref 65–99)
Glucose-Capillary: 195 mg/dL — ABNORMAL HIGH (ref 65–99)

## 2015-12-27 LAB — CBC
HCT: 30.4 % — ABNORMAL LOW (ref 35.0–47.0)
Hemoglobin: 10.4 g/dL — ABNORMAL LOW (ref 12.0–16.0)
MCH: 29.1 pg (ref 26.0–34.0)
MCHC: 34.1 g/dL (ref 32.0–36.0)
MCV: 85.5 fL (ref 80.0–100.0)
PLATELETS: 290 10*3/uL (ref 150–440)
RBC: 3.55 MIL/uL — AB (ref 3.80–5.20)
RDW: 14.9 % — ABNORMAL HIGH (ref 11.5–14.5)
WBC: 8.9 10*3/uL (ref 3.6–11.0)

## 2015-12-27 SURGERY — LOWER EXTREMITY ANGIOGRAPHY
Anesthesia: Moderate Sedation

## 2015-12-27 MED ORDER — FENTANYL CITRATE (PF) 100 MCG/2ML IJ SOLN
INTRAMUSCULAR | Status: AC
  Filled 2015-12-27: qty 2

## 2015-12-27 MED ORDER — HEPARIN SODIUM (PORCINE) 1000 UNIT/ML IJ SOLN
INTRAMUSCULAR | Status: AC
  Filled 2015-12-27: qty 1

## 2015-12-27 MED ORDER — HEPARIN (PORCINE) IN NACL 2-0.9 UNIT/ML-% IJ SOLN
INTRAMUSCULAR | Status: AC
  Filled 2015-12-27: qty 1000

## 2015-12-27 MED ORDER — CEPHALEXIN 250 MG/5ML PO SUSR: 250.0000 mg | Freq: Three times a day (TID) | ORAL | Status: DC

## 2015-12-27 MED ORDER — FENTANYL CITRATE (PF) 100 MCG/2ML IJ SOLN
INTRAMUSCULAR | Status: DC | PRN
  Administered 2015-12-27: 50 ug via INTRAVENOUS

## 2015-12-27 MED ORDER — POTASSIUM CHLORIDE 20 MEQ PO PACK
40.0000 meq | PACK | Freq: Every day | ORAL | Status: DC
  Administered 2015-12-27: 40 meq via ORAL
  Filled 2015-12-27 (×2): qty 2

## 2015-12-27 MED ORDER — MUPIROCIN 2 % EX OINT
TOPICAL_OINTMENT | Freq: Two times a day (BID) | CUTANEOUS | Status: DC
  Administered 2015-12-27 – 2015-12-28 (×2): via TOPICAL
  Filled 2015-12-27: qty 22

## 2015-12-27 MED ORDER — FAMOTIDINE 20 MG PO TABS: 40.0000 mg | ORAL_TABLET | Freq: Every day | ORAL | Status: DC

## 2015-12-27 MED ORDER — POTASSIUM CHLORIDE CRYS ER 20 MEQ PO TBCR
40.0000 meq | EXTENDED_RELEASE_TABLET | ORAL | Status: AC
  Administered 2015-12-27: 40 meq via ORAL
  Filled 2015-12-27: qty 2

## 2015-12-27 MED ORDER — MIDAZOLAM HCL 5 MG/5ML IJ SOLN
INTRAMUSCULAR | Status: AC
  Filled 2015-12-27: qty 5

## 2015-12-27 MED ORDER — CLINDAMYCIN PHOSPHATE 300 MG/50ML IV SOLN
INTRAVENOUS | Status: AC
  Filled 2015-12-27: qty 50

## 2015-12-27 MED ORDER — CEPHALEXIN 250 MG PO CAPS
250.0000 mg | ORAL_CAPSULE | Freq: Three times a day (TID) | ORAL | Status: DC
  Administered 2015-12-27: 250 mg via ORAL
  Filled 2015-12-27: qty 1

## 2015-12-27 MED ORDER — CLINDAMYCIN HCL 150 MG PO CAPS: 300.0000 mg | ORAL_CAPSULE | Freq: Three times a day (TID) | ORAL | Status: DC

## 2015-12-27 MED ORDER — INSULIN ASPART 100 UNIT/ML ~~LOC~~ SOLN: 0.0000 [IU] | Freq: Every day | SUBCUTANEOUS | Status: DC

## 2015-12-27 MED ORDER — CEPHALEXIN 250 MG PO CAPS: 250.0000 mg | ORAL_CAPSULE | Freq: Three times a day (TID) | ORAL | Status: DC

## 2015-12-27 MED ORDER — LIDOCAINE-EPINEPHRINE (PF) 1 %-1:200000 IJ SOLN
INTRAMUSCULAR | Status: AC
  Filled 2015-12-27: qty 30

## 2015-12-27 MED ORDER — MIDAZOLAM HCL 2 MG/2ML IJ SOLN
INTRAMUSCULAR | Status: DC | PRN
  Administered 2015-12-27: 2 mg via INTRAVENOUS

## 2015-12-27 MED ORDER — HYDRALAZINE HCL 20 MG/ML IJ SOLN
20.0000 mg | Freq: Once | INTRAMUSCULAR | Status: AC
Start: 2015-12-27 — End: 2015-12-27
  Administered 2015-12-27: 20 mg via INTRAVENOUS
  Filled 2015-12-27: qty 1

## 2015-12-27 MED ORDER — LINEZOLID 600 MG PO TABS
600.0000 mg | ORAL_TABLET | Freq: Two times a day (BID) | ORAL | Status: DC
  Administered 2015-12-27 – 2015-12-28 (×2): 600 mg via ORAL
  Filled 2015-12-27 (×3): qty 1

## 2015-12-27 MED ORDER — IOPAMIDOL (ISOVUE-300) INJECTION 61%
INTRAVENOUS | Status: DC | PRN
  Administered 2015-12-27: 40 mL via INTRA_ARTERIAL

## 2015-12-27 MED ORDER — INSULIN ASPART 100 UNIT/ML ~~LOC~~ SOLN
0.0000 [IU] | Freq: Three times a day (TID) | SUBCUTANEOUS | Status: DC
Start: 2015-12-27 — End: 2015-12-28
  Administered 2015-12-28: 3 [IU] via SUBCUTANEOUS
  Administered 2015-12-28: 2 [IU] via SUBCUTANEOUS
  Filled 2015-12-27: qty 2
  Filled 2015-12-27: qty 3

## 2015-12-27 SURGICAL SUPPLY — 7 items
CATH PIG 70CM (CATHETERS) ×4 IMPLANT
DEVICE STARCLOSE SE CLOSURE (Vascular Products) ×4 IMPLANT
PACK ANGIOGRAPHY (CUSTOM PROCEDURE TRAY) ×4 IMPLANT
SHEATH BRITE TIP 5FRX11 (SHEATH) ×4 IMPLANT
SYR MEDRAD MARK V 150ML (SYRINGE) ×4 IMPLANT
TUBING CONTRAST HIGH PRESS 72 (TUBING) ×4 IMPLANT
WIRE J 3MM .035X145CM (WIRE) ×4 IMPLANT

## 2015-12-27 NOTE — Care Management Note (Signed)
Case Management Note  Patient Details  Name: [name] MRN: [id number] Date of Birth: 1935/08/09  Subjective/Objective:   Spoke with patients husband at the bedside. Patient is confused today. Husband stated that she has been confused for several months and has had increasing dementia.  Patient has Home instead and also is open with [facility], for RN wound care visits. Patient left room to go to MRI and is supposed to have Angiogram today. Husband stated that she has 24 hour supervision in the home. Husnadn does not want to consider SNF placement at this time. Will arrange SW to [facility] orders at time of discharge.                 Action/Plan: Home with continued [facility] services.   Expected Discharge Date:                  Expected Discharge Plan:  [place]  In-House Referral:  Clinical Social Work  Discharge planning Services  CM Consult  Post Acute Care Choice:    Choice offered to:  Adult Children, Patient  DME Arranged:  N/A DME Agency:  NA  HH Arranged:  PT, RN HH Agency:  [facility]  Status of Service:  In process, will continue to follow  If discussed at Long Length of Stay Meetings, dates discussed:    Additional Comments:  Alvie Heidelberg, RN 12/27/2015, 2:11 PM

## 2015-12-27 NOTE — Care Management Important Message (Signed)
Important Message  Patient Details  Name: [name] MRN: [id number] Date of Birth: [birth year]   Medicare Important Message Given:  Yes    Alvie Heidelberg, RN 12/27/2015, 1:42 PM

## 2015-12-27 NOTE — Progress Notes (Signed)
Notified Dr. Vianne Bulls that patient's SBP 190s. Order to give scheduled metropolol early and Hydralazine 20 mg IV once.

## 2015-12-27 NOTE — Progress Notes (Signed)
PT Cancellation Note  Patient Details Name: [name] MRN: [id number] DOB: 10/25/1935   Cancelled Treatment:    Reason Eval/Treat Not Completed: Medical issues which prohibited therapy. Chart reviewed. Pt with critical potassium value of 2.7 this AM. Pt not currently appropriate for PT. Will attempt PT at later date/time as appropriate.  Lyndel Safe Huprich PT, DPT   Huprich,Jason 12/27/2015, 11:33 AM

## 2015-12-27 NOTE — Progress Notes (Signed)
Inpatient Diabetes Program Recommendations  AACE/ADA: New Consensus Statement on Inpatient Glycemic Control (2015)  Target Ranges:  Prepandial:   less than 140 mg/dL      Peak postprandial:   less than 180 mg/dL (1-2 hours)      Critically ill patients:  140 - 180 mg/dL   Lab Results  Component Value Date   GLUCAP 186* 12/27/2015   HGBA1C 7.6* 12/22/2015   Inpatient Diabetes Program Recommendations:   Please reduce Novolog to sensitive tid with meals and HS.  Thanks, Adah Perl, RN, BC-ADM Inpatient Diabetes Coordinator Pager [phone] (8a-5p)

## 2015-12-27 NOTE — Op Note (Signed)
[facility] Percutaneous Study/Intervention Procedural Note   Date of Surgery:  12/27/2015  Surgeon(s):DEW,JASON   Assistants:none  Pre-operative Diagnosis: PAD with ulceration RLE  Post-operative diagnosis: Same  Procedure(s) Performed: 1. Ultrasound guidance for vascular access left femoral artery 2. Catheter placement into right SFA from left femoral approach 3. Aortogram and selective right lower extremity angiogram 4.  StarClose closure device left femoral artery  EBL: minimal  Contrast: 40 cc  Fluoro Time: 1.7 minutes  Moderate Conscious Sedation Time: approximately 20 minutes using 2 mg of Versed and 50 mcg of Fentanyl  Indications: Patient is a 80 y.o.female with an infected ulceration on the right foot. Given the severity of the disease and the lack of duplex at our institution, angiogram is the prudent next step. The patient is brought in for angiography for further evaluation and potential treatment. Risks and benefits are discussed and informed consent is obtained  Procedure: The patient was identified and appropriate procedural time out was performed. The patient was then placed supine on the table and prepped and draped in the usual sterile fashion.Moderate conscious sedation was administered during a face to face encounter with the patient throughout the procedure with my supervision of the RN administering medicines and monitoring the patient's vital signs, pulse oximetry, telemetry and mental status throughout from the start of the procedure until the patient was taken to the recovery room. Ultrasound was used to evaluate the left common femoral artery. It was patent . A digital ultrasound image was acquired. A Seldinger needle was used to access the left common femoral artery under direct ultrasound guidance and a permanent image was performed. A 0.035 J wire was advanced  without resistance and a 5Fr sheath was placed. Pigtail catheter was placed into the aorta and an AP aortogram was performed. This demonstrated normal renal arteries and normal aorta and iliac segments without significant stenosis. I then crossed the aortic bifurcation and advanced to the right femoral head and into the proximal right SFA. Selective right lower extremity angiogram was then performed. This demonstrated 30% stenosis in the mid to distal SFA but no significant stenosis in the SFA and popliteal artery.  There was then two vessel runoff through the AT artery and peroneal artery without significant stenosis. Her perfusion was intact and should be adequate for wound healing.  I elected to terminate the procedure. The sheath was removed and StarClose closure device was deployed in the left femoral artery with excellent hemostatic result. The patient was taken to the recovery room in stable condition having tolerated the procedure well.  Findings:  Aortogram: calcific but not stenotic aorta, iliac arteries are patent, renal arteries are patent Right Lower Extremity: 30% stenosis in the mid to distal SFA but no significant stenosis in the SFA and popliteal artery.  There was then two vessel runoff through the AT artery and peroneal artery without significant stenosis.   Disposition: Patient was taken to the recovery room in stable condition having tolerated the procedure well.  Complications: None  DEW,JASON 12/27/2015 5:24 PM

## 2015-12-27 NOTE — Progress Notes (Signed)
Discussed with Dr. Johnnye Sima of infectious disease at [facility].  Looking at her culture results and allergy to antibiotics. Zyvox and Keflex would be a good choice.

## 2015-12-27 NOTE — Progress Notes (Signed)
ST received order, consulted with MD and nursing. Pt currently NPO for procedure. ST recently evaluated pt on 12/23/2015 and she consumed dysphagia 3 with thins without overt s/s of aspiration. Husband at bedside this morning and states that she swallows her current diet without issue but his main complaint was that she is not eating enough and is losing weight. He requests that dietary supplements to prevent further weight lose. This information was provided to pt's nursing who will order when pt's PO status changes. ST can re-consult if pt's condition changes.

## 2015-12-27 NOTE — Progress Notes (Signed)
[place] [facility] [place] NAME: [name]    MRN#:  [id number]  DATE OF BIRTH:  [birth year]  SUBJECTIVE:  Hospital Day: 5 days [name] is a 80 y.o. female presenting with Fatigue and Pressure Ulcer  Feels better.  Husband at bedside.  REVIEW OF SYSTEMS:  CONSTITUTIONAL: No fever,Positive fatigue or weakness.  EYES: No blurred or double vision.  EARS, NOSE, AND THROAT: No tinnitus or ear pain.  RESPIRATORY: No shortness of breath, positive wheezing denies hemoptysis. Cough present CARDIOVASCULAR: No chest pain, orthopnea, edema.  GASTROINTESTINAL: No nausea, vomiting, diarrhea or abdominal pain.  GENITOURINARY: No dysuria, hematuria.  ENDOCRINE: No polyuria, nocturia,  HEMATOLOGY: No anemia, easy bruising or bleeding SKIN: Right foot ulcer MUSCULOSKELETAL: No joint pain or arthritis.   NEUROLOGIC: No tingling, numbness, weakness.  PSYCHIATRY: Dementia  DRUG ALLERGIES:   Allergies  Allergen Reactions  . Codeine Nausea And Vomiting  . Penicillins Hives    Has patient had a PCN reaction causing immediate rash, facial/tongue/throat swelling, SOB or lightheadedness with hypotension: no  Has patient had a PCN reaction causing severe rash involving mucus membranes or skin necrosis: no  Has patient had a PCN reaction that required hospitalization: no  Has patient had a PCN reaction occurring within the last 10 years: no  If all of the above answers are "NO", then may proceed with Cephalosporin use.   . Sulfa Antibiotics   . Doxycycline Rash  . Erythromycin Rash  . Latex Rash    VITALS:  Blood pressure 150/61, pulse 72, temperature 98.4 F (36.9 C), temperature source Oral, resp. rate 18, height 5\' 4"  (1.626 m), weight 49.896 kg (110 lb), SpO2 93 %.  PHYSICAL EXAMINATION:  VITAL SIGNS: Filed Vitals:   12/27/15 0806 12/27/15 1043  BP: 154/89 150/61  Pulse: 73 72  Temp: 98.4 F (36.9 C)   Resp: 18    GENERAL:79  y.o.female currently in no acute distress.  HEAD: Normocephalic, atraumatic.  EYES: Pupils equal, round, reactive to light. Extraocular muscles intact. No scleral icterus.  MOUTH: Moist mucosal membrane. Dentition intact. No abscess noted.  EAR, NOSE, THROAT: Clear without exudates. No external lesions.  NECK: Supple. No thyromegaly. No nodules. No JVD.  PULMONARY: scant left sided expiratory wheeze without rails or rhonci. No use of accessory muscles, Good respiratory effort. good air entry bilaterally CHEST: Nontender to palpation.  CARDIOVASCULAR: S1 and S2. Regular rate and rhythm. No murmurs, rubs, or gallops. No edema.  GASTROINTESTINAL: Soft, nontender, nondistended. No masses. Positive bowel sounds. No hepatosplenomegaly.  MUSCULOSKELETAL: No swelling, clubbing, or edema. Range of motion full in all extremities.  NEUROLOGIC:  No gross focal neurological deficits. Sensation intact.  SKIN: Right heel ulcer dressing clean dry and intact No rashes, or cyanosis. Skin warm and dry. Turgor intact.  PSYCHIATRIC: Awake and alert  LABORATORY PANEL:   CBC  Recent Labs Lab 12/27/15 0509  WBC 8.9  HGB 10.4*  HCT 30.4*  PLT 290  ------------------------------------------------------------------------------------------------------------------ Chemistries   Recent Labs Lab 12/22/15 1203  12/27/15 0509  NA 135  < > 137  K 4.0  < > 2.7*  CL 98*  < > 97*  CO2 30  < > 32  GLUCOSE 283*  < > 197*  BUN 15  < > 10  CREATININE 0.73  < > 0.65  CALCIUM 9.0  < > 8.3*  AST 53*  --   --   ALT 34  --   --  ALKPHOS 103  --   --   BILITOT 0.6  --   --   < > = values in this interval not displayed. ------------------------------------------------------------------------------------------------------------------  Cardiac Enzymes  Recent Labs Lab 12/23/15 1935  TROPONINI <0.03    ------------------------------------------------------------------------------------------------------------------  RADIOLOGY:  No results found.  EKG:   Orders placed or performed during the hospital encounter of 12/22/15  . EKG 12-Lead  . EKG 12-Lead  . ED EKG  . ED EKG  . EKG 12-Lead  . EKG 12-Lead    ASSESSMENT AND PLAN:   [name] is a 80 y.o. female presenting with Fatigue and Pressure Ulcer . Admitted 12/22/2015 : Day #: 5 days   1, Acute on chronic encephalopathy secondary to pneumonia: With baseline dementia.   MRI of the brain negative for stroke.  Started back on the diet as per speech.   2. aspiration pneumonia patient is on clindamycin  3. Right heel pressure ulcer - Culture is growing MRSA and Klebsiella Status post debridement with podiatry, for vascular study today. Management per podiatry  Patient has multiple allergies. We will request infectious disease input for antibiotic choice.   4. GERD continue PPIs, Carafate  5 Generalized weakness physical therapy recommended rehabilitation placement.   6 Paroxysmal atrial fibrillation Patient is on Xarelto, amiodarone, metoprolol  7 COPD Unspecified, add breathing treatments given wheezing Continue Spiriva, .  8. diabetes mellitus type 2 Lantus discontinued.  On Lovenox while Xarelto is held.   All the records are reviewed and case discussed with Care Management/Social Workerr. Management plans discussed with the patient, family and they are in agreement.  CODE STATUS: dnr   TOTAL TIME TAKING CARE OF THIS PATIENT: 35 minutes.   Likely discharge tomorrow.   Hillary Bow R M.D on 12/27/2015 at 12:33 PM  Between 7am to 6pm - Pager - [phone]  After 6pm: House Pager: - [facility]  Office  [phone]  CC: Primary care physician; Halina Maidens, MD

## 2015-12-27 NOTE — H&P (Signed)
  [facility] History & Physical Update  The patient was interviewed and re-examined.  The patient's previous History and Physical has been reviewed and is unchanged.  There is no change in the plan of care. We plan to proceed with the scheduled procedure.  Reace Breshears, MD  12/27/2015, 4:36 PM

## 2015-12-27 NOTE — Progress Notes (Signed)
Paged MD to inform of critical lab value of potassium 2.7. Waiting for return call

## 2015-12-27 NOTE — Progress Notes (Signed)
Report to ann  Check right groin for bleeding or hematoma.  Patient will be on bedrest for 2 hours post sheath pull---out of bed at  1930.  Bilateral pulses are do[ppler's DP's.Marland Kitchen

## 2015-12-27 NOTE — Progress Notes (Signed)
Patient Demographics  [name], is a 80 y.o. female   MRN: [id number]   DOB - 1935-08-11  Admit Date - 12/22/2015    Outpatient Primary MD for the patient is Halina Maidens, MD  Consult requested in the Hospital by Hillary Bow, MD, On 12/27/2015    With History of -  Past Medical History  Diagnosis Date  . DM (diabetes mellitus) ([place])   . Mitral valve prolapse     a. Not noted on 03/2015 Echo.  Marland Kitchen COPD (chronic obstructive pulmonary disease) ([place])   . Palpitations   . Osteoarthritis   . Systemic lupus erythematosus ([place])   . Lumbar spondylolysis   . Raynaud's disease   . Anxiety   . IBS (irritable bowel syndrome)   . Uveitis, anterior     ????  . Rheumatic fever   . Ankylosing spondylitis ([place])     ? how diagnosed  . Insomnia, unspecified   . Chronic peptic ulcer, unspecified site, without mention of hemorrhage, perforation, or obstruction   . History of kidney stones   . Chronic kidney infection   . Asthma     as child  . Hypothyroidism     mass, U/S 6/21 - nodules  . Motion sickness     car - back seat  . GERD (gastroesophageal reflux disease)   . Wears dentures     full upper, partial lower  . Wears contact lenses   . COPD (chronic obstructive pulmonary disease) ([place])     a.Uses 2l prn - followed by Dr. Raul Del.  . Raynaud disease   . Headache   . Neuropathy ([place])   . Wheezing   . Chronic diastolic CHF (congestive heart failure) ([place])     a. 03/2015 Echo: EF 55-60%, Gr 1 DD.  Marland Kitchen PAF (paroxysmal atrial fibrillation) ([place])     a. 03/2015 in setting of hip Fx->Amio/Xarelo;  b. CHA2DS2VASc= 4.  . Left displaced femoral neck fracture ([place])     a. 03/2015 s/p L hemiarthroplasty.  . Diabetes mellitus [facility])       Past Surgical History  Procedure Laterality Date  . Esophagogastroduodenoscopy   multiple  . Colonoscopy  multiple  . Total abdominal hysterectomy w/ bilateral salpingoophorectomy  1990  . Breast biopsy    . Biopsy thyroid    . Esophagogastroduodenoscopy (egd) with propofol N/A 12/11/2014    Procedure: ESOPHAGOGASTRODUODENOSCOPY (EGD) WITH PROPOFOL;  Surgeon: [name], MD;  Location: [place];  Service: Endoscopy;  Laterality: N/A;  cytology brushing  . Cataract extraction w/phaco Left 03/23/2015    Procedure: CATARACT EXTRACTION PHACO AND INTRAOCULAR LENS PLACEMENT (IOC);  Surgeon: Birder Robson, MD;  Location: [facility];  Service: Ophthalmology;  Laterality: Left;  [place] 00:51   . Hip arthroplasty Left 03/26/2015    Procedure: ARTHROPLASTY BIPOLAR HIP (HEMIARTHROPLASTY);  Surgeon: Earnestine Leys, MD;  Location: [facility];  Service: Orthopedics;  Laterality: Left;  . Cataract extraction w/phaco Right 05/25/2015    Procedure: CATARACT EXTRACTION PHACO AND INTRAOCULAR LENS PLACEMENT (IOC);  Surgeon: Birder Robson, MD;  Location: [facility];  Service: Ophthalmology;  Laterality: Right;  [place] 00:57   . Joint replacement Left   . Hip arthroplasty  Right 11/06/2015    Procedure: ARTHROPLASTY BIPOLAR HIP (HEMIARTHROPLASTY);  Surgeon: Thornton Park, MD;  Location: [facility];  Service: Orthopedics;  Laterality: Right;    in for   Chief Complaint  Patient presents with  . Fatigue  . Pressure Ulcer     HPI  [name]  is a 80 y.o. female, History of chronic ulcer posterior right heel.   Social History Social History  Substance Use Topics  . Smoking status: Former Smoker -- 0.25 packs/day for 40 years    Types: Cigarettes    Quit date: 10/11/2011  . Smokeless tobacco: Never Used     Comment: Quit 3 years  . Alcohol Use: No     Family History Family History  Problem Relation Age of Onset  . Kidney disease Brother   . Heart disease Mother   . Hypothyroidism Mother   . Diabetes Mother   . Heart attack Mother   . Hypertension Mother   . Heart  disease Father   . Heart attack Father   . Breast cancer Sister   . Hypothyroidism Sister   . Diabetes Sister   . Ovarian cancer Sister   . Diabetes Brother   . Prostate cancer Brother   . Hypothyroidism Daughter      Prior to Admission medications   Medication Sig Start Date End Date Taking? Authorizing Provider  albuterol (PROVENTIL HFA;VENTOLIN HFA) 108 (90 Base) MCG/ACT inhaler Inhale 2 puffs into the lungs 4 (four) times daily as needed for wheezing or shortness of breath.   Yes Historical Provider, MD  albuterol (PROVENTIL) (2.5 MG/3ML) 0.083% nebulizer solution Take 2.5 mg by nebulization every 6 (six) hours as needed for wheezing or shortness of breath. Reported on 07/09/2015   Yes Historical Provider, MD  amiodarone (PACERONE) 200 MG tablet Take 1 tablet (200 mg total) by mouth daily. 06/22/15  Yes Glean Hess, MD  docusate sodium (COLACE) 100 MG capsule Take 1 capsule (100 mg total) by mouth 2 (two) times daily. 11/10/15  Yes Srikar Sudini, MD  ferrous sulfate 325 (65 FE) MG tablet Take 1 tablet (325 mg total) by mouth 2 (two) times daily with a meal. 11/10/15  Yes Srikar Sudini, MD  JANUVIA 100 MG tablet Take 0.5 tablets (50 mg total) by mouth daily. 06/22/15  Yes Glean Hess, MD  LORazepam (ATIVAN) 1 MG tablet Take 1 tablet (1 mg total) by mouth at bedtime. 11/10/15  Yes Srikar Sudini, MD  memantine (NAMENDA) 5 MG tablet Take 5 mg by mouth 2 (two) times daily.   Yes Historical Provider, MD  metFORMIN (GLUCOPHAGE-XR) 500 MG 24 hr tablet Take 1 tablet (500 mg total) by mouth daily before supper. 12/20/15  Yes Glean Hess, MD  metoprolol tartrate (LOPRESSOR) 25 MG tablet Take 25 mg by mouth 2 (two) times daily.   Yes Historical Provider, MD  mometasone-formoterol (DULERA) 100-5 MCG/ACT AERO Inhale 2 puffs into the lungs 2 (two) times daily. 07/16/15  Yes Glean Hess, MD  oxyCODONE (OXY IR/ROXICODONE) 5 MG immediate release tablet Take 1 tablet (5 mg total) by mouth every  4 (four) hours as needed for severe pain ((for MODERATE breakthrough pain)). 11/10/15  Yes Srikar Sudini, MD  Probiotic Product (ALIGN) 4 MG CAPS Take 1 capsule (4 mg total) by mouth daily. 06/22/15  Yes Glean Hess, MD  ranitidine (ZANTAC) 150 MG tablet Take 1 tablet (150 mg total) by mouth 2 (two) times daily. 06/22/15  Yes Glean Hess, MD  rivaroxaban (XARELTO) 20 MG TABS tablet Take 1 tablet (20 mg total) by mouth daily. 06/22/15  Yes Glean Hess, MD  senna (SENOKOT) 8.6 MG TABS tablet Take 1 tablet by mouth 2 (two) times daily.   Yes Historical Provider, MD  tiotropium (SPIRIVA) 18 MCG inhalation capsule Place 1 capsule (18 mcg total) into inhaler and inhale daily. 06/22/15  Yes Glean Hess, MD  feeding supplement, ENSURE ENLIVE, (ENSURE ENLIVE) LIQD Take 237 mLs by mouth 2 (two) times daily between meals. 11/10/15   Hillary Bow, MD    Anti-infectives    Start     Dose/Rate Route Frequency Ordered Stop   12/27/15 1400  clindamycin (CLEOCIN) capsule 300 mg     300 mg Oral Every 8 hours 12/27/15 1237     12/22/15 1615  clindamycin (CLEOCIN) IVPB 600 mg  Status:  Discontinued     600 mg 100 mL/hr over 30 Minutes Intravenous Every 8 hours 12/22/15 1601 12/27/15 1233      Scheduled Meds: . amiodarone  200 mg Oral Daily  . clindamycin  300 mg Oral Q8H  . collagenase   Topical Daily  . docusate sodium  100 mg Oral BID  . enoxaparin (LOVENOX) injection  40 mg Subcutaneous Q24H  . famotidine (PEPCID) IV  20 mg Intravenous Q24H  . insulin aspart  0-5 Units Subcutaneous QHS  . insulin aspart  0-9 Units Subcutaneous TID WC  . insulin glargine  5 Units Subcutaneous Daily  . metoprolol tartrate  25 mg Oral BID  . mometasone-formoterol  2 puff Inhalation BID  . mupirocin ointment   Topical BID  . potassium chloride  40 mEq Oral Daily  . potassium chloride  40 mEq Oral Q4H  . senna  1 tablet Oral BID  . sodium chloride flush  3 mL Intravenous Q12H  . sucralfate  1 g Oral TID  WC  . tiotropium  18 mcg Inhalation Daily   Allergies  Allergen Reactions  . Codeine Nausea And Vomiting  . Penicillins Hives    Has patient had a PCN reaction causing immediate rash, facial/tongue/throat swelling, SOB or lightheadedness with hypotension: no  Has patient had a PCN reaction causing severe rash involving mucus membranes or skin necrosis: no  Has patient had a PCN reaction that required hospitalization: no  Has patient had a PCN reaction occurring within the last 10 years: no  If all of the above answers are "NO", then may proceed with Cephalosporin use.   . Sulfa Antibiotics   . Doxycycline Rash  . Erythromycin Rash  . Latex Rash    Physical Exam  Vitals  Blood pressure 150/61, pulse 72, temperature 98.4 F (36.9 C), temperature source Oral, resp. rate 18, height 5\' 4"  (1.626 m), weight 49.896 kg (110 lb), SpO2 93 %.  Lower Extremity exam:Wound is stable but cultures have shown positive MRSA contamination. No overt cellulitis or redness to the surrounding areas of the wound. Wound is very slow to heal. Data Review  CBC  Recent Labs Lab 12/22/15 1203 12/23/15 0507 12/24/15 0753 12/27/15 0509  WBC 17.0* 17.7* 8.0 8.9  HGB 11.0* 10.3* 9.7* 10.4*  HCT 32.9* 31.6* 30.0* 30.4*  PLT 232 214 194 290  MCV 86.5 88.4 88.0 85.5  MCH 28.8 28.9 28.6 29.1  MCHC 33.3 32.7 32.5 34.1  RDW 15.4* 15.2* 15.2* 14.9*  LYMPHSABS 0.7*  --   --   --   MONOABS 1.4*  --   --   --  EOSABS 0.0  --   --   --   BASOSABS 0.1  --   --   --    ------------------------------------------------------------------------------------------------------------------  Chemistries   Recent Labs Lab 12/22/15 1203 12/23/15 0507 12/26/15 0434 12/27/15 0509  NA 135 138  --  137  K 4.0 3.5  --  2.7*  CL 98* 104  --  97*  CO2 30 26  --  32  GLUCOSE 283* 195*  --  197*  BUN 15 17  --  10  CREATININE 0.73 0.61 0.52 0.65  CALCIUM 9.0 8.7*  --  8.3*  AST 53*  --   --   --   ALT 34  --    --   --   ALKPHOS 103  --   --   --   BILITOT 0.6  --   --   --   --------   Assessment & Plan: MRSA contamination to the posterior right heel ulceration. Treatment compounded because of history of allergies to vancomycin and sulfa and doxycycline. She is currently on Cleocin. Plan: We'll start a wet-to-dry saline dressing with Bactroban to the posterior right heel today continue use lots of padding continue where the heel boot. We'll order a MRI for evaluation of underlying or deepening infection. We will discuss with Dr. Darvin Neighbours hopefully we can start her on either IV or oral antibiotics that can be more helpful with this. about a possible consult with infectious disease regarding there recommendations. On my discussions with her today and she stated that she didn't really have a rash to doxycycline but had a yeast infection. I'm not sure how accurate that is needs to be explored further.  Principal Problem:   Acute respiratory distress (HCC) Active Problems:   Chronic diastolic CHF (congestive heart failure) (HCC)   Paroxysmal atrial fibrillation (HCC)   Pneumonia   Pressure ulcer   Demand ischemia (HCC)   Metabolic encephalopathy   Sepsis ([place])     Family Communication: Plan discussed with patient and **   Thank you for the consult, we will follow the patient with you in the Hospital.   Perry Mount M.D on 12/27/2015 at 1:14 PM  Thank you for the consult, we will follow the patient with you in the Hospital.

## 2015-12-28 ENCOUNTER — Encounter: Payer: Self-pay | Admitting: Vascular Surgery

## 2015-12-28 DIAGNOSIS — Z66 Do not resuscitate: Secondary | ICD-10-CM

## 2015-12-28 DIAGNOSIS — Z515 Encounter for palliative care: Secondary | ICD-10-CM

## 2015-12-28 DIAGNOSIS — F0390 Unspecified dementia without behavioral disturbance: Secondary | ICD-10-CM

## 2015-12-28 LAB — GLUCOSE, CAPILLARY
GLUCOSE-CAPILLARY: 169 mg/dL — AB (ref 65–99)
Glucose-Capillary: 209 mg/dL — ABNORMAL HIGH (ref 65–99)

## 2015-12-28 LAB — BASIC METABOLIC PANEL
ANION GAP: 9 (ref 5–15)
BUN: 13 mg/dL (ref 6–20)
CALCIUM: 8.7 mg/dL — AB (ref 8.9–10.3)
CHLORIDE: 100 mmol/L — AB (ref 101–111)
CO2: 31 mmol/L (ref 22–32)
Creatinine, Ser: 0.77 mg/dL (ref 0.44–1.00)
GFR calc Af Amer: >60 mL/min (ref >60)
GFR calc non Af Amer: >60 mL/min (ref >60)
GLUCOSE: 180 mg/dL — AB (ref 65–99)
POTASSIUM: 3.5 mmol/L (ref 3.5–5.1)
Sodium: 140 mmol/L (ref 135–145)

## 2015-12-28 MED ORDER — LINEZOLID 600 MG PO TABS: 600.0000 mg | ORAL_TABLET | Freq: Two times a day (BID) | ORAL | Status: DC

## 2015-12-28 MED ORDER — COLLAGENASE 250 UNIT/GM EX OINT: TOPICAL_OINTMENT | Freq: Every day | CUTANEOUS | Status: DC

## 2015-12-28 MED ORDER — CEPHALEXIN 250 MG PO CAPS: 250.0000 mg | ORAL_CAPSULE | Freq: Three times a day (TID) | ORAL | Status: DC

## 2015-12-28 NOTE — Care Management (Signed)
This RNCM was asked to fax home health orders to [facility] home care which I did- to Santiago Glad with [facility].

## 2015-12-28 NOTE — Care Management (Signed)
Spoke with patient daughter Maudie Mercury and Husband Saralyn Pilar. Maudie Mercury was asking about palliative care and what that might consist of.   Suggested to go ahead and make referral to palliative care NP to establish Goals of care.  Both husband and daughter are agreeable to this plan.

## 2015-12-28 NOTE — Progress Notes (Signed)
Physical Therapy Treatment Patient Details Name: [name] MRN: [id number] DOB: March 07, 1936 Today's Date: 12/28/2015    History of Present Illness [name] is a 80 y.o. female with a known history of Multiple medical problems including diabetes mellitus, COPD not on home oxygen, SLE, Raynaud, rheumatic fever, hypertension, peptic ulcer disease, paroxysmal atrial fibrillation on xarelto, recent admission for fall and left hip fracture in October 2016 a few months later had a fall with right femoral neck fracture and hemi 11/06/15.  Pt with R heel ulceration, likely having a debridment and possible procedure next week.     PT Comments    Pt initially declined due to fatigue but agreed with encouragement.  Participated in exercises as described below.  Transitioned to edge of bed with min a x 1.  Stood with mod a x 1 with post lean requiring assist.  She was able to ambulate 35' in room with min a x 1 with mod verbal cues to keep wb status on right foot.  Overall does well but limited distances by Probation officer to maintain.  Husband in for session and reports cognition is at baseline.  Stated he would walk with pt at home but did not physically help her much.  He reports being comfortable with her assistance level at this time to return home.  Pt did better today with bed mobility and transfers and agree with his ability to care for her at home.  Discussed WB status and need for verbal cues to maintain.  He stated he used wheelchair at home for longer distances.   Follow Up Recommendations  Home health PT;Supervision/Assistance - 24 hour     Equipment Recommendations       Recommendations for Other Services       Precautions / Restrictions Precautions Precautions: Fall Restrictions Weight Bearing Restrictions: Yes Other Position/Activity Restrictions: R posterior calcaneal ulcer, no pressure/WBing on back of heel    Mobility  Bed Mobility Overal bed mobility: Needs  Assistance Bed Mobility: Supine to Sit     Supine to sit: Min assist     General bed mobility comments: verbal cues   Transfers Overall transfer level: Needs assistance Equipment used: Rolling walker (2 wheeled) Transfers: Sit to/from Stand Sit to Stand: Mod assist         General transfer comment: Posterior lean  Ambulation/Gait Ambulation/Gait assistance: Min assist Ambulation Distance (Feet): 35 Feet Assistive device: Rolling walker (2 wheeled) Gait Pattern/deviations: Decreased step length - right;Decreased step length - left;Shuffle;Narrow base of support     General Gait Details: needs mod verbal cues to maintain wb status.  Does well with cues.   Stairs            Wheelchair Mobility    Modified Rankin (Stroke Patients Only)       Balance Overall balance assessment: Needs assistance Sitting-balance support: Feet supported Sitting balance-Leahy Scale: Fair   Postural control: Posterior lean Standing balance support: Bilateral upper extremity supported Standing balance-Leahy Scale: Poor                      Cognition Arousal/Alertness: Awake/alert Behavior During Therapy: WFL for tasks assessed/performed Overall Cognitive Status: History of cognitive impairments - at baseline                      Exercises General Exercises - Lower Extremity Ankle Circles/Pumps: AROM;Both;10 reps;Supine Heel Slides: AROM;Both;10 reps;Supine Straight Leg Raises: AROM;Both;10 reps;Supine    General Comments  Pertinent Vitals/Pain      Home Living                      Prior Function            PT Goals (current goals can now be found in the care plan section) Progress towards PT goals: Progressing toward goals    Frequency  Min 2X/week    PT Plan Current plan remains appropriate    Co-evaluation             End of Session Equipment Utilized During Treatment: Gait belt Activity Tolerance: Patient tolerated  treatment well;Other (comment) (limited by writer to maintain wb status) Patient left: in chair;with call bell/phone within reach;with chair alarm set;with family/visitor present     Time: 1406-1430 PT Time Calculation (min) (ACUTE ONLY): 24 min  Charges:  $Gait Training: 8-22 mins $Therapeutic Exercise: 8-22 mins                    G Codes:      Chesley Noon, PTA 12/28/2015, 3:05 PM

## 2015-12-28 NOTE — Discharge Instructions (Addendum)
Resume diet and activity.  Home health to resume as before. With Education officer, museum.  Cleanse wounds to right heel and right dorsal foot with NS and pat gently dry. Apply Santyl ointment to wound bed. Cover with NS moist gauze. Cover with 4x4 gauze and secure with kerlix. Change daily.

## 2015-12-28 NOTE — Progress Notes (Signed)
Nursing consulted and advises that pt continues to consumed dysphagia 3 with thin liquids without overt s/s of aspiration. Nursing reports that pt continues with decreased consumption possibly d/t to increased confusion. Skilled ST no longer appears appropriate as pt's decreased consumption doesn't appear related to oropharyngeal abilities. Nursing verbalized agreement.

## 2015-12-28 NOTE — Consult Note (Signed)
Consultation Note Date: 12/28/2015   Patient Name: [name]  DOB: 10-14-1935  MRN: [id number]  Age / Sex: 80 y.o., female  PCP: Glean Hess, MD Referring Physician: Hillary Bow, MD  Reason for Consultation: Establishing goals of care  HPI/Patient Profile: 80 y.o. female  admitted on 12/22/2015 with an extensive medical history. PMH for  PAF on Xarelto in the setting of femur fracture s/p hemiarthroplasty/ Fall [id number], chronic diastolic CHF, COPD, mitrla valve prolapse, rheumatic fever, and asthma and dementia.  Non-healing right foot would.  Lives at home with husband, has home health services  Significant cognitive decline over the past 3 years.       Continued physical, functional and cognitive decline.  Family is faced with advanced care decisions and anticipatory care needs.  [place] is for discharge today, family requesting Palliative consult   Clinical Assessment and Goals of Care:    This NP Wadie Lessen reviewed medical records, received report from team, assessed the patient and then meet at the patient's bedside along with her husband, and briefly by phone with daughter Maudie Mercury  to discuss diagnosis prognosis, [place], EOL wishes disposition and options.   A detailed discussion was had today regarding advanced directives.  Concepts specific to code status, artifical feeding and hydration, continued IV antibiotics and rehospitalization was had.  The difference between a aggressive medical intervention path  and a palliative comfort care path for this patient at this time was had.  Values and goals of care important to patient and family were attempted to be elicited.  Concept of Hospice and Palliative Care were discussed  Natural trajectory and expectations at EOL were discussed.  Questions and concerns addressed.  Family encouraged to call with questions or concerns.  PMT will continue  to support holistically.   Educated husband on Dementia Support Services through [facility]    Code Status/Advance Care Planning:  DNR  Palliative Prophylaxis:   Aspiration, Bowel Regimen, Frequent Pain Assessment and Oral Care   Psycho-social/Spiritual:    Additional Recommendations: Caregiving  Support/Resources, Education on Hospice and Grief/Bereavement Support   Husband is struggling with current situation and the realization that he cannot keep her at home much longer.  It is getting difficult.  All children/from both previous marriage support consideration for placement  Educated husband on Dementia Support Services through [facility]  Prognosis:   Unable to determine  - dependant on desire for life prolonging measures   Discharge [place] in the home,  Home with Home Health      Primary Diagnoses: Present on Admission:  . Pneumonia . Pressure ulcer . Acute respiratory distress (HCC) . Demand ischemia ([place]) . Metabolic encephalopathy . Paroxysmal atrial fibrillation (HCC) . Chronic diastolic CHF (congestive heart failure) ([place]) . Sepsis ([place])  I have reviewed the medical record, interviewed the patient and family, and examined the patient. The following aspects are pertinent.  Past Medical History  Diagnosis Date  . DM (diabetes mellitus) ([place])   . Mitral valve prolapse     a. Not noted on 03/2015 Echo.  Marland Kitchen COPD (chronic obstructive pulmonary disease) ([place])   . Palpitations   . Osteoarthritis   . Systemic lupus erythematosus ([place])   . Lumbar spondylolysis   . Raynaud's disease   . Anxiety   . IBS (irritable bowel syndrome)   . Uveitis, anterior     ????  . Rheumatic fever   . Ankylosing spondylitis ([place])     ? how diagnosed  . Insomnia, unspecified   . Chronic peptic ulcer, unspecified site, without mention of hemorrhage, perforation, or obstruction   .  History of kidney stones   . Chronic kidney infection   . Asthma     as child  . Hypothyroidism     mass, U/S 6/21 - nodules  . Motion sickness     car - back seat  . GERD (gastroesophageal reflux disease)   . Wears dentures     full upper, partial lower  . Wears contact lenses   . COPD (chronic obstructive pulmonary disease) ([place])     a.Uses 2l prn - followed by Dr. Raul Del.  . Raynaud disease   . Headache   . Neuropathy ([place])   . Wheezing   . Chronic diastolic CHF (congestive heart failure) ([place])     a. 03/2015 Echo: EF 55-60%, Gr 1 DD.  Marland Kitchen PAF (paroxysmal atrial fibrillation) ([place])     a. 03/2015 in setting of hip Fx->Amio/Xarelo;  b. CHA2DS2VASc= 4.  . Left displaced femoral neck fracture ([place])     a. 03/2015 s/p L hemiarthroplasty.  . Diabetes mellitus ([place])    Social History   Social History  . Marital Status: Married    Spouse Name: N/A  . Number of Children: 4  . Years of Education: N/A   Occupational History  . retired    Social History Main Topics  . Smoking status: Former Smoker -- 0.25 packs/day for 40 years    Types: Cigarettes    Quit date: 10/11/2011  . Smokeless tobacco: Never Used     Comment: Quit 3 years  . Alcohol Use: No  . Drug Use: No  . Sexual Activity: Not Asked   Other Topics Concern  . None   Social History Narrative   Married (#2)   Moved from [place] in Summer 2014 - Lives in [place]   1 son, 3 daughters (one lives in this area)   2 caffeinated beverages daily   04/18/2013 update   Family History  Problem Relation Age of Onset  . Kidney disease Brother   . Heart disease Mother   . Hypothyroidism Mother   . Diabetes Mother   . Heart attack Mother   . Hypertension Mother   . Heart disease Father   . Heart attack Father   . Breast cancer Sister   . Hypothyroidism Sister   . Diabetes Sister   . Ovarian cancer Sister   . Diabetes Brother   . Prostate cancer Brother   . Hypothyroidism Daughter    Scheduled Meds: .  amiodarone  200 mg Oral Daily  . cephALEXin  250 mg Oral Q8H  . collagenase   Topical Daily  . docusate sodium  100 mg Oral BID  . enoxaparin (LOVENOX) injection  40 mg Subcutaneous Q24H  . famotidine  40 mg Oral QHS  . insulin aspart  0-5 Units Subcutaneous QHS  . insulin  aspart  0-9 Units Subcutaneous TID WC  . insulin glargine  5 Units Subcutaneous Daily  . linezolid  600 mg Oral Q12H  . metoprolol tartrate  25 mg Oral BID  . mometasone-formoterol  2 puff Inhalation BID  . mupirocin ointment   Topical BID  . potassium chloride  40 mEq Oral Daily  . senna  1 tablet Oral BID  . sodium chloride flush  3 mL Intravenous Q12H  . sucralfate  1 g Oral TID WC  . tiotropium  18 mcg Inhalation Daily   Continuous Infusions:  PRN Meds:.acetaminophen **OR** acetaminophen, bisacodyl, ipratropium-albuterol, morphine injection, ondansetron (ZOFRAN) IV Medications Prior to Admission:  Prior to Admission medications   Medication Sig Start Date End Date Taking? Authorizing Provider  albuterol (PROVENTIL HFA;VENTOLIN HFA) 108 (90 Base) MCG/ACT inhaler Inhale 2 puffs into the lungs 4 (four) times daily as needed for wheezing or shortness of breath.   Yes Historical Provider, MD  albuterol (PROVENTIL) (2.5 MG/3ML) 0.083% nebulizer solution Take 2.5 mg by nebulization every 6 (six) hours as needed for wheezing or shortness of breath. Reported on 07/09/2015   Yes Historical Provider, MD  amiodarone (PACERONE) 200 MG tablet Take 1 tablet (200 mg total) by mouth daily. 06/22/15  Yes Glean Hess, MD  docusate sodium (COLACE) 100 MG capsule Take 1 capsule (100 mg total) by mouth 2 (two) times daily. 11/10/15  Yes Srikar Sudini, MD  ferrous sulfate 325 (65 FE) MG tablet Take 1 tablet (325 mg total) by mouth 2 (two) times daily with a meal. 11/10/15  Yes Srikar Sudini, MD  JANUVIA 100 MG tablet Take 0.5 tablets (50 mg total) by mouth daily. 06/22/15  Yes Glean Hess, MD  LORazepam (ATIVAN) 1 MG tablet Take 1  tablet (1 mg total) by mouth at bedtime. 11/10/15  Yes Srikar Sudini, MD  memantine (NAMENDA) 5 MG tablet Take 5 mg by mouth 2 (two) times daily.   Yes Historical Provider, MD  metFORMIN (GLUCOPHAGE-XR) 500 MG 24 hr tablet Take 1 tablet (500 mg total) by mouth daily before supper. 12/20/15  Yes Glean Hess, MD  metoprolol tartrate (LOPRESSOR) 25 MG tablet Take 25 mg by mouth 2 (two) times daily.   Yes Historical Provider, MD  mometasone-formoterol (DULERA) 100-5 MCG/ACT AERO Inhale 2 puffs into the lungs 2 (two) times daily. 07/16/15  Yes Glean Hess, MD  oxyCODONE (OXY IR/ROXICODONE) 5 MG immediate release tablet Take 1 tablet (5 mg total) by mouth every 4 (four) hours as needed for severe pain ((for MODERATE breakthrough pain)). 11/10/15  Yes Srikar Sudini, MD  Probiotic Product (ALIGN) 4 MG CAPS Take 1 capsule (4 mg total) by mouth daily. 06/22/15  Yes Glean Hess, MD  ranitidine (ZANTAC) 150 MG tablet Take 1 tablet (150 mg total) by mouth 2 (two) times daily. 06/22/15  Yes Glean Hess, MD  rivaroxaban (XARELTO) 20 MG TABS tablet Take 1 tablet (20 mg total) by mouth daily. 06/22/15  Yes Glean Hess, MD  senna (SENOKOT) 8.6 MG TABS tablet Take 1 tablet by mouth 2 (two) times daily.   Yes Historical Provider, MD  tiotropium (SPIRIVA) 18 MCG inhalation capsule Place 1 capsule (18 mcg total) into inhaler and inhale daily. 06/22/15  Yes Glean Hess, MD  feeding supplement, ENSURE ENLIVE, (ENSURE ENLIVE) LIQD Take 237 mLs by mouth 2 (two) times daily between meals. 11/10/15   Hillary Bow, MD   Allergies  Allergen Reactions  . Codeine Nausea And Vomiting  .  Penicillins Hives    Has patient had a PCN reaction causing immediate rash, facial/tongue/throat swelling, SOB or lightheadedness with hypotension: no  Has patient had a PCN reaction causing severe rash involving mucus membranes or skin necrosis: no  Has patient had a PCN reaction that required hospitalization: no  Has  patient had a PCN reaction occurring within the last 10 years: no  If all of the above answers are "NO", then may proceed with Cephalosporin use.   . Sulfa Antibiotics   . Doxycycline Rash  . Erythromycin Rash  . Latex Rash   Review of Systems  Unable to perform ROS: Dementia    Physical Exam  Constitutional: She appears ill.  -thin and frail  Cardiovascular: Normal rate, regular rhythm and normal heart sounds.   Pulmonary/Chest: Effort normal and breath sounds normal.  Feet:  Right Foot:  Skin Integrity: Positive for ulcer.    Vital Signs: BP 129/61 mmHg  Pulse 66  Temp(Src) 98 F (36.7 C) (Oral)  Resp 16  Ht 5\' 4"  (1.626 m)  Wt 49.896 kg (110 lb)  BMI 18.87 kg/m2  SpO2 100% Pain Assessment: No/denies pain   Pain Score: 8    SpO2: SpO2: 100 % O2 Device:SpO2: 100 % O2 Flow Rate: .O2 Flow Rate (L/min): 2 L/min  IO: Intake/output summary: No intake or output data in the 24 hours ending 12/28/15 1137  LBM: Last BM Date: 12/27/15 Baseline Weight: Weight: 49.896 kg (110 lb) Most recent weight: Weight: 49.896 kg (110 lb)      Palliative Assessment/Data: 40%   Discussed  With Dr Darvin Neighbours  Time In: 1300 Time Out: 1415 Time Total: 75 min Greater than 50%  of this time was spent counseling and coordinating care related to the above assessment and plan.  Signed by: Wadie Lessen, NP   Please contact Palliative Medicine Team phone at [phone] for questions and concerns.  For individual provider: See Shea Evans

## 2015-12-28 NOTE — Care Management (Signed)
Patient to be discharge to home with Home Health and Palliative following. Sing off.

## 2015-12-28 NOTE — Progress Notes (Signed)
Patient was discharged home with home health. Husband was here during dressing change.  Gave information about MRSA and dressing care. Reviewed follow-up appointments, last dose of meds given, CHF info, and activity.  IV removed with cath intact. Allow time for questions.

## 2015-12-29 DIAGNOSIS — L89610 Pressure ulcer of right heel, unstageable: Secondary | ICD-10-CM | POA: Diagnosis not present

## 2015-12-29 DIAGNOSIS — I5032 Chronic diastolic (congestive) heart failure: Secondary | ICD-10-CM | POA: Diagnosis not present

## 2015-12-29 DIAGNOSIS — Z66 Do not resuscitate: Secondary | ICD-10-CM

## 2015-12-29 DIAGNOSIS — L89620 Pressure ulcer of left heel, unstageable: Secondary | ICD-10-CM | POA: Diagnosis not present

## 2015-12-29 DIAGNOSIS — F0390 Unspecified dementia without behavioral disturbance: Secondary | ICD-10-CM | POA: Insufficient documentation

## 2015-12-29 DIAGNOSIS — Z515 Encounter for palliative care: Secondary | ICD-10-CM

## 2015-12-29 DIAGNOSIS — S72001D Fracture of unspecified part of neck of right femur, subsequent encounter for closed fracture with routine healing: Secondary | ICD-10-CM | POA: Diagnosis not present

## 2015-12-29 DIAGNOSIS — L89890 Pressure ulcer of other site, unstageable: Secondary | ICD-10-CM | POA: Diagnosis not present

## 2015-12-29 LAB — AEROBIC/ANAEROBIC CULTURE (SURGICAL/DEEP WOUND)

## 2015-12-29 LAB — AEROBIC/ANAEROBIC CULTURE W GRAM STAIN (SURGICAL/DEEP WOUND)

## 2015-12-30 ENCOUNTER — Encounter: Payer: Medicare Other | Admitting: Surgery

## 2015-12-30 DIAGNOSIS — F0390 Unspecified dementia without behavioral disturbance: Secondary | ICD-10-CM | POA: Diagnosis not present

## 2015-12-30 DIAGNOSIS — E11621 Type 2 diabetes mellitus with foot ulcer: Secondary | ICD-10-CM | POA: Diagnosis not present

## 2015-12-30 DIAGNOSIS — F419 Anxiety disorder, unspecified: Secondary | ICD-10-CM | POA: Diagnosis not present

## 2015-12-30 DIAGNOSIS — J449 Chronic obstructive pulmonary disease, unspecified: Secondary | ICD-10-CM | POA: Diagnosis not present

## 2015-12-30 DIAGNOSIS — I480 Paroxysmal atrial fibrillation: Secondary | ICD-10-CM | POA: Diagnosis not present

## 2015-12-30 DIAGNOSIS — M329 Systemic lupus erythematosus, unspecified: Secondary | ICD-10-CM | POA: Diagnosis not present

## 2015-12-30 DIAGNOSIS — Z7901 Long term (current) use of anticoagulants: Secondary | ICD-10-CM | POA: Diagnosis not present

## 2015-12-30 DIAGNOSIS — Z87891 Personal history of nicotine dependence: Secondary | ICD-10-CM | POA: Diagnosis not present

## 2015-12-30 DIAGNOSIS — L97811 Non-pressure chronic ulcer of other part of right lower leg limited to breakdown of skin: Secondary | ICD-10-CM | POA: Diagnosis not present

## 2015-12-30 DIAGNOSIS — R339 Retention of urine, unspecified: Secondary | ICD-10-CM | POA: Diagnosis not present

## 2015-12-30 DIAGNOSIS — L97211 Non-pressure chronic ulcer of right calf limited to breakdown of skin: Secondary | ICD-10-CM | POA: Diagnosis not present

## 2015-12-30 DIAGNOSIS — E1122 Type 2 diabetes mellitus with diabetic chronic kidney disease: Secondary | ICD-10-CM | POA: Diagnosis not present

## 2015-12-30 DIAGNOSIS — L97212 Non-pressure chronic ulcer of right calf with fat layer exposed: Secondary | ICD-10-CM | POA: Diagnosis not present

## 2015-12-30 DIAGNOSIS — L97411 Non-pressure chronic ulcer of right heel and midfoot limited to breakdown of skin: Secondary | ICD-10-CM | POA: Diagnosis not present

## 2015-12-30 DIAGNOSIS — I509 Heart failure, unspecified: Secondary | ICD-10-CM | POA: Diagnosis not present

## 2015-12-30 DIAGNOSIS — N189 Chronic kidney disease, unspecified: Secondary | ICD-10-CM | POA: Diagnosis not present

## 2015-12-30 DIAGNOSIS — E11622 Type 2 diabetes mellitus with other skin ulcer: Secondary | ICD-10-CM | POA: Diagnosis not present

## 2015-12-30 DIAGNOSIS — L89613 Pressure ulcer of right heel, stage 3: Secondary | ICD-10-CM | POA: Diagnosis not present

## 2015-12-30 NOTE — Discharge Summary (Signed)
[facility] NAME: [name]    MR#:  [id number]  DATE OF BIRTH:  03/17/1936  DATE OF ADMISSION:  12/22/2015 ADMITTING PHYSICIAN: Loletha Grayer, MD  DATE OF DISCHARGE: 12/28/2015  4:19 PM  PRIMARY CARE PHYSICIAN: Halina Maidens, MD   ADMISSION DIAGNOSIS:  Facial droop [R29.810] Infected decubitus ulcer, unspecified pressure ulcer stage [L89.90] Cerebral infarction due to unspecified mechanism [I63.9] Aspiration pneumonia of right lung, unspecified aspiration pneumonia type, unspecified part of lung ([place]) [J69.0]  DISCHARGE DIAGNOSIS:  Principal Problem:   Acute respiratory distress ([place]) Active Problems:   Chronic diastolic CHF (congestive heart failure) (HCC)   Paroxysmal atrial fibrillation ([place])   Pneumonia   Demand ischemia ([place])   Metabolic encephalopathy   Sepsis ([place])   DNR (do not resuscitate)   Palliative care encounter   Dementia   SECONDARY DIAGNOSIS:   Past Medical History  Diagnosis Date  . DM (diabetes mellitus) ([place])   . Mitral valve prolapse     a. Not noted on 03/2015 Echo.  Marland Kitchen COPD (chronic obstructive pulmonary disease) ([place])   . Palpitations   . Osteoarthritis   . Systemic lupus erythematosus ([place])   . Lumbar spondylolysis   . Raynaud's disease   . Anxiety   . IBS (irritable bowel syndrome)   . Uveitis, anterior     ????  . Rheumatic fever   . Ankylosing spondylitis ([place])     ? how diagnosed  . Insomnia, unspecified   . Chronic peptic ulcer, unspecified site, without mention of hemorrhage, perforation, or obstruction   . History of kidney stones   . Chronic kidney infection   . Asthma     as child  . Hypothyroidism     mass, U/S 6/21 - nodules  . Motion sickness     car - back seat  . GERD (gastroesophageal reflux disease)   . Wears dentures     full upper, partial lower  . Wears contact lenses   . COPD (chronic obstructive pulmonary disease) ([place])     a.Uses 2l prn -  followed by Dr. Raul Del.  . Raynaud disease   . Headache   . Neuropathy ([place])   . Wheezing   . Chronic diastolic CHF (congestive heart failure) ([place])     a. 03/2015 Echo: EF 55-60%, Gr 1 DD.  Marland Kitchen PAF (paroxysmal atrial fibrillation) ([place])     a. 03/2015 in setting of hip Fx->Amio/Xarelo;  b. CHA2DS2VASc= 4.  . Left displaced femoral neck fracture ([place])     a. 03/2015 s/p L hemiarthroplasty.  . Diabetes mellitus ([place])      ADMITTING HISTORY  [name] is a 80 y.o. female presents with altered mental status and sleeping a lot. Brought in by family. Yesterday she was sleeping a lot having chills and cold feeling. She is not been herself. She urinated in the bed. She's been pretty unresponsive and couldn't walk or talk very well. Yesterday family noticed eyelids was drooping and she did not eat or drink. She was rectally impacted and had some abdominal pain on presentation and was disimpacted. She felt like vomiting. She is also had the urge to urinate.   HOSPITAL COURSE:    [name] is a 80 y.o. female presenting with Fatigue and Pressure Ulcer . Admitted 12/22/2015  1, Acute on chronic encephalopathy secondary to pneumonia: With baseline dementia.  MRI of the brain negative for stroke.  Started back on the diet as per  speech.   2. aspiration pneumonia patient is on clindamycin. resolved  3. Right heel pressure ulcer - Culture is growing MRSA and Klebsiella Status post debridement with podiatry Right lower extremity angiograms showed no significant stenosis needing intervention. Management per podiatry  Patient has multiple allergies. After discussing with Dr. Johnnye Sima of infectious disease at [facility] patient is being placed on Keflex and Zyvox.   4. GERD continue PPIs, Carafate  5 Generalized weakness physical therapy recommended rehabilitation placement.  Family wanted to take patient home with home health which has been set up.  6 Paroxysmal atrial  fibrillation Patient is on Xarelto, amiodarone, metoprolol  7 COPD Stable  8. diabetes mellitus type 2  On Lovenox while Xarelto was held in the hospital for procedure.  Patient was seen by palliative care in the hospital. Palliative care at home recommended.  Palliative care to follow at home.  CONSULTS OBTAINED:     DRUG ALLERGIES:   Allergies  Allergen Reactions  . Codeine Nausea And Vomiting  . Penicillins Hives    Has patient had a PCN reaction causing immediate rash, facial/tongue/throat swelling, SOB or lightheadedness with hypotension: no  Has patient had a PCN reaction causing severe rash involving mucus membranes or skin necrosis: no  Has patient had a PCN reaction that required hospitalization: no  Has patient had a PCN reaction occurring within the last 10 years: no  If all of the above answers are "NO", then may proceed with Cephalosporin use.   . Sulfa Antibiotics   . Doxycycline Rash  . Erythromycin Rash  . Latex Rash    DISCHARGE MEDICATIONS:   Discharge Medication List as of 12/28/2015  3:42 PM    START taking these medications   Details  cephALEXin (KEFLEX) 250 MG capsule Take 1 capsule (250 mg total) by mouth 3 (three) times daily., Starting 12/28/2015, Until Discontinued, Normal    collagenase (SANTYL) ointment Apply topically daily., Starting 12/28/2015, Until Discontinued, Normal    linezolid (ZYVOX) 600 MG tablet Take 1 tablet (600 mg total) by mouth every 12 (twelve) hours., Starting 12/28/2015, Until Discontinued, Normal      CONTINUE these medications which have NOT CHANGED   Details  albuterol (PROVENTIL HFA;VENTOLIN HFA) 108 (90 Base) MCG/ACT inhaler Inhale 2 puffs into the lungs 4 (four) times daily as needed for wheezing or shortness of breath., Until Discontinued, Historical Med    albuterol (PROVENTIL) (2.5 MG/3ML) 0.083% nebulizer solution Take 2.5 mg by nebulization every 6 (six) hours as needed for wheezing or shortness of breath.  Reported on 07/09/2015, Until Discontinued, Historical Med    amiodarone (PACERONE) 200 MG tablet Take 1 tablet (200 mg total) by mouth daily., Starting 06/22/2015, Until Discontinued, Normal    docusate sodium (COLACE) 100 MG capsule Take 1 capsule (100 mg total) by mouth 2 (two) times daily., Starting 11/10/2015, Until Discontinued, Print    ferrous sulfate 325 (65 FE) MG tablet Take 1 tablet (325 mg total) by mouth 2 (two) times daily with a meal., Starting 11/10/2015, Until Discontinued, No Print    JANUVIA 100 MG tablet Take 0.5 tablets (50 mg total) by mouth daily., Starting 06/22/2015, Until Discontinued, Normal    LORazepam (ATIVAN) 1 MG tablet Take 1 tablet (1 mg total) by mouth at bedtime., Starting 11/10/2015, Until Discontinued, Print    memantine (NAMENDA) 5 MG tablet Take 5 mg by mouth 2 (two) times daily., Until Discontinued, Historical Med    metFORMIN (GLUCOPHAGE-XR) 500 MG 24 hr tablet Take 1 tablet (  500 mg total) by mouth daily before supper., Starting 12/20/2015, Until Discontinued, Normal    metoprolol tartrate (LOPRESSOR) 25 MG tablet Take 25 mg by mouth 2 (two) times daily., Until Discontinued, Historical Med    mometasone-formoterol (DULERA) 100-5 MCG/ACT AERO Inhale 2 puffs into the lungs 2 (two) times daily., Starting 07/16/2015, Until Discontinued, Print    oxyCODONE (OXY IR/ROXICODONE) 5 MG immediate release tablet Take 1 tablet (5 mg total) by mouth every 4 (four) hours as needed for severe pain ((for MODERATE breakthrough pain))., Starting 11/10/2015, Until Discontinued, Print    Probiotic Product (ALIGN) 4 MG CAPS Take 1 capsule (4 mg total) by mouth daily., Starting 06/22/2015, Until Discontinued, Normal    ranitidine (ZANTAC) 150 MG tablet Take 1 tablet (150 mg total) by mouth 2 (two) times daily., Starting 06/22/2015, Until Discontinued, Normal    rivaroxaban (XARELTO) 20 MG TABS tablet Take 1 tablet (20 mg total) by mouth daily., Starting 06/22/2015, Until  Discontinued, Normal    senna (SENOKOT) 8.6 MG TABS tablet Take 1 tablet by mouth 2 (two) times daily., Until Discontinued, Historical Med    tiotropium (SPIRIVA) 18 MCG inhalation capsule Place 1 capsule (18 mcg total) into inhaler and inhale daily., Starting 06/22/2015, Until Discontinued, Normal    feeding supplement, ENSURE ENLIVE, (ENSURE ENLIVE) LIQD Take 237 mLs by mouth 2 (two) times daily between meals., Starting 11/10/2015, Until Discontinued, No Print        Today   VITAL SIGNS:  Blood pressure 153/57, pulse 65, temperature 98.2 F (36.8 C), temperature source Oral, resp. rate 18, height 5\' 4"  (1.626 m), weight 49.896 kg (110 lb), SpO2 99 %.  I/O:  No intake or output data in the 24 hours ending 12/30/15 1208  PHYSICAL EXAMINATION:  Physical Exam  GENERAL:  80 y.o.-year-old patient lying in the bed with no acute distress.  LUNGS: Normal breath sounds bilaterally, no wheezing, rales,rhonchi or crepitation. No use of accessory muscles of respiration.  CARDIOVASCULAR: S1, S2 normal. No murmurs, rubs, or gallops.  ABDOMEN: Soft, non-tender, non-distended. Bowel sounds present. No organomegaly or mass.  NEUROLOGIC: Moves all 4 extremities. PSYCHIATRIC: The patient is alert and Awake. Pleasantly confused. SKIN: Right foot ulcer.  DATA REVIEW:   CBC  Recent Labs Lab 12/27/15 0509  WBC 8.9  HGB 10.4*  HCT 30.4*  PLT 290    Chemistries   Recent Labs Lab 12/28/15 0654  NA 140  K 3.5  CL 100*  CO2 31  GLUCOSE 180*  BUN 13  CREATININE 0.77  CALCIUM 8.7*    Cardiac Enzymes  Recent Labs Lab 12/23/15 1935  TROPONINI <0.03    Microbiology Results  Results for orders placed or performed during the hospital encounter of 12/22/15  Culture, blood (routine x 2)     Status: None (Preliminary result)   Collection Time: 12/22/15  4:34 PM  Result Value Ref Range Status   Specimen Description BLOOD LEFT FOREARM  Final   Special Requests BOTTLES DRAWN AEROBIC  AND ANAEROBIC 3 CC  Final   Culture NO GROWTH 4 DAYS  Final   Report Status PENDING  Incomplete  Culture, blood (routine x 2)     Status: None (Preliminary result)   Collection Time: 12/22/15  4:50 PM  Result Value Ref Range Status   Specimen Description BLOOD LEFT ARM  Final   Special Requests BOTTLES DRAWN AEROBIC AND ANAEROBIC 1CC  Final   Culture NO GROWTH 4 DAYS  Final   Report Status PENDING  Incomplete  Aerobic/Anaerobic Culture (surgical/deep wound)     Status: None   Collection Time: 12/24/15  2:15 PM  Result Value Ref Range Status   Specimen Description HEEL  Final   Special Requests NONE  Final   Gram Stain   Final    RARE WBC PRESENT, PREDOMINANTLY MONONUCLEAR FEW GRAM POSITIVE COCCI IN PAIRS    Culture   Final    FEW METHICILLIN RESISTANT STAPHYLOCOCCUS AUREUS FEW KLEBSIELLA OXYTOCA NO ANAEROBES ISOLATED Performed at [facility]    Report Status 12/29/2015 FINAL  Final   Organism ID, Bacteria METHICILLIN RESISTANT STAPHYLOCOCCUS AUREUS  Final   Organism ID, Bacteria KLEBSIELLA OXYTOCA  Final      Susceptibility   Klebsiella oxytoca - MIC*    AMPICILLIN >=32 RESISTANT Resistant     CEFAZOLIN <=4 SENSITIVE Sensitive     CEFEPIME <=1 SENSITIVE Sensitive     CEFTAZIDIME <=1 SENSITIVE Sensitive     CEFTRIAXONE <=1 SENSITIVE Sensitive     CIPROFLOXACIN <=0.25 SENSITIVE Sensitive     GENTAMICIN <=1 SENSITIVE Sensitive     IMIPENEM <=0.25 SENSITIVE Sensitive     TRIMETH/SULFA <=20 SENSITIVE Sensitive     AMPICILLIN/SULBACTAM 8 SENSITIVE Sensitive     PIP/TAZO <=4 SENSITIVE Sensitive     Extended ESBL NEGATIVE Sensitive     * FEW KLEBSIELLA OXYTOCA   Methicillin resistant staphylococcus aureus - MIC*    CIPROFLOXACIN >=8 RESISTANT Resistant     ERYTHROMYCIN >=8 RESISTANT Resistant     GENTAMICIN <=0.5 SENSITIVE Sensitive     OXACILLIN >=4 RESISTANT Resistant     TETRACYCLINE <=1 SENSITIVE Sensitive     VANCOMYCIN <=0.5 SENSITIVE Sensitive      TRIMETH/SULFA <=10 SENSITIVE Sensitive     CLINDAMYCIN >=8 RESISTANT Resistant     RIFAMPIN <=0.5 SENSITIVE Sensitive     Inducible Clindamycin NEGATIVE Sensitive     * FEW METHICILLIN RESISTANT STAPHYLOCOCCUS AUREUS    RADIOLOGY:  No results found.  Follow up with PCP in 1 week.  Management plans discussed with the patient, family and they are in agreement.  CODE STATUS:  Code Status History    Date Active Date Inactive Code Status Order ID Comments User Context   12/22/2015  4:35 PM 12/28/2015  7:19 PM DNR [id number]  Loletha Grayer, MD ED   11/04/2015  5:49 PM 11/06/2015  1:32 PM Full Code [id number]  Thornton Park, MD Inpatient   11/04/2015  3:35 PM 11/04/2015  5:49 PM Full Code [id number]  Gladstone Lighter, MD Inpatient   07/01/2015  1:47 PM 07/03/2015  4:26 PM Full Code [id number]  Aldean Jewett, MD Inpatient   03/26/2015  4:59 PM 04/01/2015  6:34 PM Full Code [id number]  Earnestine Leys, MD Inpatient   03/26/2015  3:21 AM 03/26/2015  4:59 PM Full Code [id number]  Lance Coon, MD Inpatient   03/26/2015  2:35 AM 03/26/2015  3:21 AM Full Code [id number]  Earnestine Leys, MD ED    Questions for Most Recent Historical Code Status (Order [id number])    Question Answer Comment   In the event of cardiac or respiratory ARREST Do not call a "code blue"    In the event of cardiac or respiratory ARREST Do not perform Intubation, CPR, defibrillation or ACLS    In the event of cardiac or respiratory ARREST Use medication by any route, position, wound care, and other measures to relive pain and suffering. May use oxygen, suction and manual treatment of airway obstruction as needed for comfort.  Comments nurse may pronounce       TOTAL TIME TAKING CARE OF THIS PATIENT ON DAY OF DISCHARGE: more than 30 minutes.   Hillary Bow R M.D on 12/30/2015 at 12:08 PM  Between 7am to 6pm - Pager - [phone]  After 6pm go to [email or web address] - password [email or web address] [facility]  Office   [phone]  CC: Primary care physician; Halina Maidens, MD  Note: This dictation was prepared with Dragon dictation along with smaller phrase technology. Any transcriptional errors that result from this process are unintentional.

## 2015-12-31 LAB — GLUCOSE, CAPILLARY: Glucose-Capillary: 155 mg/dL — ABNORMAL HIGH (ref 65–99)

## 2015-12-31 NOTE — Progress Notes (Signed)
CATRINIA, RIGGINS ([id number]) Visit Report for 12/30/2015 Arrival Information Details Patient Name: [name], [name]. Date of Service: 12/30/2015 12:45 PM Medical Record Number: [id number] Patient Account Number: [id number] Date of Birth/Sex: 02-Jul-1935 (80 y.o. Female) Treating RN: Baruch Gouty, RN, BSN, [name] Primary Care Physician: Halina Maidens Other Clinician: Referring Physician: Halina Maidens Treating Physician/Extender: Frann Rider in Treatment: 4 Visit Information History Since Last Visit Added or deleted any medications: No Patient Arrived: Wheel Chair Any new allergies or adverse reactions: No Arrival Time: 12:57 Had a fall or experienced change in No Accompanied By: hubby activities of daily living that may affect Transfer Assistance: None risk of falls: Patient Identification Verified: Yes Signs or symptoms of abuse/neglect since last No Secondary Verification Process Yes visito Completed: Has Dressing in Place as Prescribed: Yes Patient Requires Transmission- No Pain Present Now: No Based Precautions: Patient Has Alerts: Yes Patient Alerts: Patient on Blood Thinner Electronic Signature(s) Signed: 12/30/2015 3:37:03 PM By: Regan Lemming BSN, RN Entered By: Regan Lemming on 12/30/2015 12:58:10 [name] ([id number]) -------------------------------------------------------------------------------- Encounter Discharge Information Details Patient Name: [name] Date of Service: 12/30/2015 12:45 PM Medical Record Number: [id number] Patient Account Number: [id number] Date of Birth/Sex: 1936-02-16 (80 y.o. Female) Treating RN: Baruch Gouty, RN, BSN, [name] Primary Care Physician: Halina Maidens Other Clinician: Referring Physician: Halina Maidens Treating Physician/Extender: Frann Rider in Treatment: 4 Encounter Discharge Information Items Discharge Pain Level: 0 Discharge Condition: Stable Ambulatory Status: Wheelchair Discharge Destination:  Home Transportation: Private Auto Accompanied By: Micheline Rough Schedule Follow-up Appointment: No Medication Reconciliation completed and provided to Patient/Care No Johnwesley Lederman: Provided on Clinical Summary of Care: 12/30/2015 Form Type Recipient Paper Patient LK Electronic Signature(s) Signed: 12/30/2015 1:34:05 PM By: Ruthine Dose Entered By: Ruthine Dose on 12/30/2015 13:34:05 [name] ([id number]) -------------------------------------------------------------------------------- Lower Extremity Assessment Details Patient Name: [name] Date of Service: 12/30/2015 12:45 PM Medical Record Number: [id number] Patient Account Number: [id number] Date of Birth/Sex: September 17, 1935 (80 y.o. Female) Treating RN: Baruch Gouty, RN, BSN, [name] Primary Care Physician: Halina Maidens Other Clinician: Referring Physician: Halina Maidens Treating Physician/Extender: Frann Rider in Treatment: 4 Vascular Assessment Pulses: Posterior Tibial Dorsalis Pedis Palpable: [Right:Yes] Extremity colors, hair growth, and conditions: Extremity Color: [Right:Normal] Hair Growth on Extremity: [Right:Yes] Temperature of Extremity: [Right:Warm] Capillary Refill: [Right:< 3 seconds] Toe Nail Assessment Left: Right: Thick: No Discolored: No Deformed: No Improper Length and Hygiene: No Electronic Signature(s) Signed: 12/30/2015 3:37:03 PM By: Regan Lemming BSN, RN Entered By: Regan Lemming on 12/30/2015 13:06:29 [name] ([id number]) -------------------------------------------------------------------------------- Multi Wound Chart Details Patient Name: [name] Date of Service: 12/30/2015 12:45 PM Medical Record Number: [id number] Patient Account Number: [id number] Date of Birth/Sex: 1935-11-27 (79 y.o. Female) Treating RN: Baruch Gouty, RN, BSN, [name] Primary Care Physician: Halina Maidens Other Clinician: Referring Physician: Halina Maidens Treating Physician/Extender: Frann Rider in Treatment: 4 Vital Signs Height(in): 64 Pulse(bpm): 57 Weight(lbs): 110 Blood Pressure 128/54 (mmHg): Body Mass Index(BMI): 19 Temperature(F): 97.9 Respiratory Rate 16 (breaths/min): Photos: [2:No Photos] [3:No Photos] [4:No Photos] Wound Location: [2:Left Lower Leg - Anterior] [3:Right Lower Leg - Anterior Right Lower Leg - Medial] Wounding Event: [2:Gradually Appeared] [3:Gradually Appeared] [4:Gradually Appeared] Primary Etiology: [2:Diabetic Wound/Ulcer of the Lower Extremity] [3:Diabetic Wound/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] [4:the Lower Extremity] Comorbid History: [2:Chronic Obstructive Pulmonary Disease (COPD), Angina, Congestive Heart Failure, Type II Diabetes, Lupus Erythematosus, Osteoarthritis, Dementia] [3:Chronic Obstructive Pulmonary Disease (COPD), Angina, Congestive Heart Failure,  Congestive Heart Failure, Type II Diabetes, Lupus Type II Diabetes,  Lupus Erythematosus, Osteoarthritis, Dementia Osteoarthritis, Dementia] [4:Chronic Obstructive Pulmonary Disease (COPD), Angina, Erythematosus,] Date Acquired: [2:10/27/2015] [3:11/03/2015] [4:11/03/2015] Weeks of Treatment: [2:4] [3:4] [4:4] Wound Status: [2:Healed - Epithelialized] [3:Healed - Epithelialized] [4:Open] Measurements L x W x D 0x0x0 [3:0x0x0] [4:1x4x0.2] (cm) Area (cm) : [2:0] [3:0] [4:3.142] Volume (cm) : [2:0] [3:0] [4:0.628] % Reduction in Area: [2:100.00%] [3:100.00%] [4:-28.20%] % Reduction in Volume: 100.00% [3:100.00%] [4:-156.30%] Classification: [2:Grade 1] [3:Grade 1] [4:Grade 1] Exudate Amount: [2:None Present] [3:None Present] [4:Medium] Exudate Type: [2:N/A] [3:N/A] [4:Serosanguineous] Exudate Color: [2:N/A] [3:N/A] [4:red, brown] Foul Odor After [2:N/A] [3:N/A] [4:No] Cleansing: Odor Anticipated Due to N/A [3:N/A] [4:N/A] Product Use: Wound Margin: [2:Distinct, outline attached] [3:Distinct, outline attached Distinct, outline attached] Granulation  Amount: None Present (0%) None Present (0%) Small (1-33%) Granulation Quality: N/A N/A Pink, Pale Necrotic Amount: None Present (0%) None Present (0%) Large (67-100%) Exposed Structures: Fascia: No Fascia: No Fascia: No Fat: No Fat: No Fat: No Tendon: No Tendon: No Tendon: No Muscle: No Muscle: No Muscle: No Joint: No Joint: No Joint: No Bone: No Bone: No Bone: No Limited to Skin Limited to Skin Limited to Skin Breakdown Breakdown Breakdown Epithelialization: Large (67-100%) Large (67-100%) None Periwound Skin Texture: Edema: No Edema: No Edema: Yes Excoriation: No Excoriation: No Excoriation: No Induration: No Induration: No Induration: No Callus: No Callus: No Callus: No Crepitus: No Crepitus: No Crepitus: No Fluctuance: No Fluctuance: No Fluctuance: No Friable: No Friable: No Friable: No Rash: No Rash: No Rash: No Scarring: No Scarring: No Scarring: No Periwound Skin Dry/Scaly: Yes Dry/Scaly: Yes Moist: Yes Moisture: Maceration: No Maceration: No Maceration: No Moist: No Moist: No Dry/Scaly: No Periwound Skin Color: Atrophie Blanche: No Atrophie Blanche: No Atrophie Blanche: No Cyanosis: No Cyanosis: No Cyanosis: No Ecchymosis: No Ecchymosis: No Ecchymosis: No Erythema: No Erythema: No Erythema: No Hemosiderin Staining: No Hemosiderin Staining: No Hemosiderin Staining: No Mottled: No Mottled: No Mottled: No Pallor: No Pallor: No Pallor: No Rubor: No Rubor: No Rubor: No Temperature: N/A No Abnormality No Abnormality Tenderness on No No Yes Palpation: Wound Preparation: Ulcer Cleansing: Not Ulcer Cleansing: Ulcer Cleansing: Cleansed Rinsed/Irrigated with Rinsed/Irrigated with Saline Saline Topical Anesthetic Applied: None Topical Anesthetic Topical Anesthetic Applied: None Applied: Other: lidocaine 4% Wound Number: 5 N/A N/A Photos: No Photos N/A N/A Wound Location: Right Calcaneus - N/A N/A Posterior Wounding Event:  Gradually Appeared N/A N/A Primary Etiology: Diabetic Wound/Ulcer of N/A N/A the Lower Extremity [name], [name] ([id number]) Comorbid History: Chronic Obstructive N/A N/A Pulmonary Disease (COPD), Angina, Congestive Heart Failure, Type II Diabetes, Lupus Erythematosus, Osteoarthritis, Dementia Date Acquired: 11/03/2015 N/A N/A Weeks of Treatment: 4 N/A N/A Wound Status: Open N/A N/A Measurements L x W x D 2.5x2x0.2 N/A N/A (cm) Area (cm) : 3.927 N/A N/A Volume (cm) : 0.785 N/A N/A % Reduction in Area: 58.90% N/A N/A % Reduction in Volume: 17.80% N/A N/A Classification: Grade 1 N/A N/A Exudate Amount: Large N/A N/A Exudate Type: Serosanguineous N/A N/A Exudate Color: red, brown N/A N/A Foul Odor After Yes N/A N/A Cleansing: Odor Anticipated Due to No N/A N/A Product Use: Wound Margin: Distinct, outline attached N/A N/A Granulation Amount: Medium (34-66%) N/A N/A Granulation Quality: Pink, Pale N/A N/A Necrotic Amount: Medium (34-66%) N/A N/A Exposed Structures: Fascia: No N/A N/A Fat: No Tendon: No Muscle: No Joint: No Bone: No Limited to Skin Breakdown Epithelialization: Small (1-33%) N/A N/A Periwound Skin Texture: Edema: Yes N/A N/A Excoriation: No Induration: No Callus: No Crepitus: No Fluctuance: No Friable: No Rash: No  Scarring: No Periwound Skin Maceration: Yes N/A N/A Moisture: Moist: Yes Dry/Scaly: No Periwound Skin Color: N/A N/A [name], [name] ([id number]) Mottled: Yes Atrophie Blanche: No Cyanosis: No Ecchymosis: No Erythema: No Hemosiderin Staining: No Pallor: No Rubor: No Temperature: No Abnormality N/A N/A Tenderness on No N/A N/A Palpation: Wound Preparation: Ulcer Cleansing: N/A N/A Rinsed/Irrigated with Saline Topical Anesthetic Applied: Other: lidocaine 4% Treatment Notes Electronic Signature(s) Signed: 12/30/2015 1:15:10 PM By: Regan Lemming BSN, RN Entered By: Regan Lemming on 12/30/2015 13:15:10 [name]  ([id number]) -------------------------------------------------------------------------------- [place] Details Patient Name: [name] Date of Service: 12/30/2015 12:45 PM Medical Record Number: [id number] Patient Account Number: [id number] Date of Birth/Sex: 07-05-1935 (80 y.o. Female) Treating RN: Baruch Gouty, RN, BSN, [name] Primary Care Physician: Halina Maidens Other Clinician: Referring Physician: Halina Maidens Treating Physician/Extender: Frann Rider in Treatment: 4 Active Inactive Abuse / Safety / Falls / Self Care Management Nursing Diagnoses: Potential for falls Goals: Patient/caregiver will verbalize understanding of skin care regimen Date Initiated: 12/02/2015 Goal Status: Active Patient/caregiver will verbalize/demonstrate measures taken to prevent injury and/or falls Date Initiated: 12/02/2015 Goal Status: Active Interventions: Assess fall risk on admission and as needed Assess: immobility, friction, shearing, incontinence upon admission and as needed Assess impairment of mobility on admission and as needed per policy Assess self care needs on admission and as needed Provide education on fall prevention Provide education on safe transfers Notes: Wound/Skin Impairment Nursing Diagnoses: Impaired tissue integrity Goals: Patient/caregiver will verbalize understanding of skin care regimen Date Initiated: 12/02/2015 Goal Status: Active Interventions: Assess patient/caregiver ability to obtain necessary supplies Assess patient/caregiver ability to perform ulcer/skin care regimen upon admission and as needed [name], [name] ([id number]) Assess ulceration(s) every visit Provide education on ulcer and skin care Notes: Electronic Signature(s) Signed: 12/30/2015 1:15:01 PM By: Regan Lemming BSN, RN Entered By: Regan Lemming on 12/30/2015 13:15:00 [name]  ([id number]) -------------------------------------------------------------------------------- Pain Assessment Details Patient Name: [name] Date of Service: 12/30/2015 12:45 PM Medical Record Number: [id number] Patient Account Number: [id number] Date of Birth/Sex: July 17, 1935 (80 y.o. Female) Treating RN: Baruch Gouty, RN, BSN, [name] Primary Care Physician: Halina Maidens Other Clinician: Referring Physician: Halina Maidens Treating Physician/Extender: Frann Rider in Treatment: 4 Active Problems Location of Pain Severity and Description of Pain Patient Has Paino No Site Locations With Dressing Change: No Pain Management and Medication Current Pain Management: Electronic Signature(s) Signed: 12/30/2015 3:37:03 PM By: Regan Lemming BSN, RN Entered By: Regan Lemming on 12/30/2015 12:58:18 [name] ([id number]) -------------------------------------------------------------------------------- Patient/Caregiver Education Details Patient Name: [name] Date of Service: 12/30/2015 12:45 PM Medical Record Number: [id number] Patient Account Number: [id number] Date of Birth/Gender: 05-Nov-1935 (80 y.o. Female) Treating RN: Baruch Gouty, RN, BSN, [name] Primary Care Physician: Halina Maidens Other Clinician: Referring Physician: Halina Maidens Treating Physician/Extender: Frann Rider in Treatment: 4 Education Assessment Education Provided To: Patient and Caregiver husband Education Topics Provided Basic Hygiene: Methods: Explain/Verbal Responses: State content correctly Safety: Methods: Explain/Verbal Responses: State content correctly Wound/Skin Impairment: Methods: Explain/Verbal Responses: State content correctly Electronic Signature(s) Signed: 12/30/2015 3:37:03 PM By: Regan Lemming BSN, RN Entered By: Regan Lemming on 12/30/2015 13:33:05 [name] ([id number]) -------------------------------------------------------------------------------- Wound  Assessment Details Patient Name: [name] Date of Service: 12/30/2015 12:45 PM Medical Record Number: [id number] Patient Account Number: [id number] Date of Birth/Sex: May 06, 1936 (80 y.o. Female) Treating RN: Baruch Gouty, RN, BSN, [name] Primary Care Physician: Halina Maidens Other Clinician: Referring Physician: Halina Maidens Treating Physician/Extender: Frann Rider in Treatment: 4 Wound Status  Wound Number: 2 Primary Diabetic Wound/Ulcer of the Lower Etiology: Extremity Wound Location: Left Lower Leg - Anterior Wound Healed - Epithelialized Wounding Event: Gradually Appeared Status: Date Acquired: 10/27/2015 Comorbid Chronic Obstructive Pulmonary Disease Weeks Of Treatment: 4 History: (COPD), Angina, Congestive Heart Clustered Wound: No Failure, Type II Diabetes, Lupus Erythematosus, Osteoarthritis, Dementia Photos Photo Uploaded By: Regan Lemming on 12/30/2015 16:24:34 Wound Measurements Length: (cm) 0 % Reductio Width: (cm) 0 % Reductio Depth: (cm) 0 Epithelial Area: (cm) 0 Tunneling Volume: (cm) 0 Undermini n in Area: 100% n in Volume: 100% ization: Large (67-100%) : No ng: No Wound Description Classification: Grade 1 Wound Margin: Distinct, outline attached Exudate Amount: None Present Wound Bed Granulation Amount: None Present (0%) Exposed Structure Necrotic Amount: None Present (0%) Fascia Exposed: No Fat Layer Exposed: No Tendon Exposed: No Muscle Exposed: No [name], [name] ([id number]) Joint Exposed: No Bone Exposed: No Limited to Skin Breakdown Periwound Skin Texture Texture Color No Abnormalities Noted: No No Abnormalities Noted: No Callus: No Atrophie Blanche: No Crepitus: No Cyanosis: No Excoriation: No Ecchymosis: No Fluctuance: No Erythema: No Friable: No Hemosiderin Staining: No Induration: No Mottled: No Localized Edema: No Pallor: No Rash: No Rubor: No Scarring: No Moisture No Abnormalities Noted: No Dry /  Scaly: Yes Maceration: No Moist: No Wound Preparation Ulcer Cleansing: Not Cleansed Topical Anesthetic Applied: None Electronic Signature(s) Signed: 12/30/2015 3:37:03 PM By: Regan Lemming BSN, RN Entered By: Regan Lemming on 12/30/2015 13:02:49 [name] ([id number]) -------------------------------------------------------------------------------- Wound Assessment Details Patient Name: [name] Date of Service: 12/30/2015 12:45 PM Medical Record Number: [id number] Patient Account Number: [id number] Date of Birth/Sex: 04/24/1936 (80 y.o. Female) Treating RN: Afful, RN, BSN, [place] Primary Care Physician: Halina Maidens Other Clinician: Referring Physician: Halina Maidens Treating Physician/Extender: Frann Rider in Treatment: 4 Wound Status Wound Number: 3 Primary Diabetic Wound/Ulcer of the Lower Etiology: Extremity Wound Location: Right Lower Leg - Anterior Wound Healed - Epithelialized Wounding Event: Gradually Appeared Status: Date Acquired: 11/03/2015 Comorbid Chronic Obstructive Pulmonary Disease Weeks Of Treatment: 4 History: (COPD), Angina, Congestive Heart Clustered Wound: No Failure, Type II Diabetes, Lupus Erythematosus, Osteoarthritis, Dementia Photos Photo Uploaded By: Regan Lemming on 12/30/2015 16:24:57 Wound Measurements Length: (cm) 0 % Reductio Width: (cm) 0 % Reductio Depth: (cm) 0 Epithelial Area: (cm) 0 Tunneling Volume: (cm) 0 Undermini n in Area: 100% n in Volume: 100% ization: Large (67-100%) : No ng: No Wound Description Classification: Grade 1 Wound Margin: Distinct, outline attached Exudate Amount: None Present Wound Bed Granulation Amount: None Present (0%) Exposed Structure Necrotic Amount: None Present (0%) Fascia Exposed: No Fat Layer Exposed: No Tendon Exposed: No Muscle Exposed: No [name], [name] ([id number]) Joint Exposed: No Bone Exposed: No Limited to Skin Breakdown Periwound Skin Texture Texture  Color No Abnormalities Noted: No No Abnormalities Noted: No Callus: No Atrophie Blanche: No Crepitus: No Cyanosis: No Excoriation: No Ecchymosis: No Fluctuance: No Erythema: No Friable: No Hemosiderin Staining: No Induration: No Mottled: No Localized Edema: No Pallor: No Rash: No Rubor: No Scarring: No Temperature / Pain Moisture Temperature: No Abnormality No Abnormalities Noted: No Dry / Scaly: Yes Maceration: No Moist: No Wound Preparation Ulcer Cleansing: Rinsed/Irrigated with Saline Topical Anesthetic Applied: None Electronic Signature(s) Signed: 12/30/2015 3:37:03 PM By: Regan Lemming BSN, RN Entered By: Regan Lemming on 12/30/2015 13:02:02 [name] ([id number]) -------------------------------------------------------------------------------- Wound Assessment Details Patient Name: [name] Date of Service: 12/30/2015 12:45 PM Medical Record Number: [id number] Patient Account Number: [id number] Date of Birth/Sex: 30-Sep-1935 (79  y.o. Female) Treating RN: Afful, RN, BSN, [place] Primary Care Physician: Halina Maidens Other Clinician: Referring Physician: Halina Maidens Treating Physician/Extender: Frann Rider in Treatment: 4 Wound Status Wound Number: 4 Primary Diabetic Wound/Ulcer of the Lower Etiology: Extremity Wound Location: Right Lower Leg - Medial Wound Open Wounding Event: Gradually Appeared Status: Date Acquired: 11/03/2015 Comorbid Chronic Obstructive Pulmonary Disease Weeks Of Treatment: 4 History: (COPD), Angina, Congestive Heart Clustered Wound: No Failure, Type II Diabetes, Lupus Erythematosus, Osteoarthritis, Dementia Photos Photo Uploaded By: Regan Lemming on 12/30/2015 16:24:57 Wound Measurements Length: (cm) 1 Width: (cm) 4 Depth: (cm) 0.2 Area: (cm) 3.142 Volume: (cm) 0.628 % Reduction in Area: -28.2% % Reduction in Volume: -156.3% Epithelialization: None Tunneling: No Undermining: No Wound  Description Classification: Grade 1 Wound Margin: Distinct, outline attached Exudate Amount: Medium Exudate Type: Serosanguineous Exudate Color: red, brown Foul Odor After Cleansing: No Wound Bed Granulation Amount: Small (1-33%) Exposed Structure Granulation Quality: Pink, Pale Fascia Exposed: No Necrotic Amount: Large (67-100%) Fat Layer Exposed: No [name], [name] ([id number]) Necrotic Quality: Adherent Slough Tendon Exposed: No Muscle Exposed: No Joint Exposed: No Bone Exposed: No Limited to Skin Breakdown Periwound Skin Texture Texture Color No Abnormalities Noted: No No Abnormalities Noted: No Callus: No Atrophie Blanche: No Crepitus: No Cyanosis: No Excoriation: No Ecchymosis: No Fluctuance: No Erythema: No Friable: No Hemosiderin Staining: No Induration: No Mottled: No Localized Edema: Yes Pallor: No Rash: No Rubor: No Scarring: No Temperature / Pain Moisture Temperature: No Abnormality No Abnormalities Noted: No Tenderness on Palpation: Yes Dry / Scaly: No Maceration: No Moist: Yes Wound Preparation Ulcer Cleansing: Rinsed/Irrigated with Saline Topical Anesthetic Applied: Other: lidocaine 4%, Treatment Notes Wound #4 (Right, Medial Lower Leg) 1. Cleansed with: Clean wound with Normal Saline 4. Dressing Applied: Prisma Ag 5. Secondary Dressing Applied Gauze and Kerlix/Conform 7. Secured with Recruitment consultant) Signed: 12/30/2015 3:37:03 PM By: Regan Lemming BSN, RN Entered By: Regan Lemming on 12/30/2015 13:03:40 [name] ([id number]) -------------------------------------------------------------------------------- Wound Assessment Details Patient Name: [name] Date of Service: 12/30/2015 12:45 PM Medical Record Number: [id number] Patient Account Number: [id number] Date of Birth/Sex: [birth year] (80 y.o. Female) Treating RN: Afful, RN, BSN, [place] Primary Care Physician: Halina Maidens Other Clinician: Referring  Physician: Halina Maidens Treating Physician/Extender: Frann Rider in Treatment: 4 Wound Status Wound Number: 5 Primary Diabetic Wound/Ulcer of the Lower Etiology: Extremity Wound Location: Right Calcaneus - Posterior Wound Open Wounding Event: Gradually Appeared Status: Date Acquired: 11/03/2015 Comorbid Chronic Obstructive Pulmonary Disease Weeks Of Treatment: 4 History: (COPD), Angina, Congestive Heart Clustered Wound: No Failure, Type II Diabetes, Lupus Erythematosus, Osteoarthritis, Dementia Photos Photo Uploaded By: Regan Lemming on 12/30/2015 16:24:58 Wound Measurements Length: (cm) 2.5 Width: (cm) 2 Depth: (cm) 0.2 Area: (cm) 3.927 Volume: (cm) 0.785 % Reduction in Area: 58.9% % Reduction in Volume: 17.8% Epithelialization: Small (1-33%) Tunneling: No Undermining: No Wound Description Classification: Grade 1 Wound Margin: Distinct, outline attached Exudate Amount: Large Exudate Type: Serosanguineous Exudate Color: red, brown Foul Odor After Cleansing: Yes Due to Product Use: No Wound Bed Granulation Amount: Medium (34-66%) Exposed Structure Granulation Quality: Pink, Pale Fascia Exposed: No Necrotic Amount: Medium (34-66%) Fat Layer Exposed: No [name], [name] ([id number]) Necrotic Quality: Adherent Slough Tendon Exposed: No Muscle Exposed: No Joint Exposed: No Bone Exposed: No Limited to Skin Breakdown Periwound Skin Texture Texture Color No Abnormalities Noted: No No Abnormalities Noted: No Callus: No Atrophie Blanche: No Crepitus: No Cyanosis: No Excoriation: No Ecchymosis: No Fluctuance: No Erythema: No Friable: No Hemosiderin  Staining: No Induration: No Mottled: Yes Localized Edema: Yes Pallor: No Rash: No Rubor: No Scarring: No Temperature / Pain Moisture Temperature: No Abnormality No Abnormalities Noted: No Dry / Scaly: No Maceration: Yes Moist: Yes Wound Preparation Ulcer Cleansing: Rinsed/Irrigated with  Saline Topical Anesthetic Applied: Other: lidocaine 4%, Treatment Notes Wound #5 (Right, Posterior Calcaneus) 1. Cleansed with: Clean wound with Normal Saline 4. Dressing Applied: Santyl Ointment 5. Secondary Dressing Applied Gauze and Kerlix/Conform Electronic Signature(s) Signed: 12/30/2015 3:37:03 PM By: Regan Lemming BSN, RN Entered By: Regan Lemming on 12/30/2015 13:14:50 [name] ([id number]) -------------------------------------------------------------------------------- [place] Details Patient Name: [name] Date of Service: 12/30/2015 12:45 PM Medical Record Number: [id number] Patient Account Number: [id number] Date of Birth/Sex: 1936/01/08 (80 y.o. Female) Treating RN: Afful, RN, BSN, [place] Primary Care Physician: Halina Maidens Other Clinician: Referring Physician: Halina Maidens Treating Physician/Extender: Frann Rider in Treatment: 4 Vital Signs Time Taken: 13:05 Temperature (F): 97.9 Height (in): 64 Pulse (bpm): 57 Weight (lbs): 110 Respiratory Rate (breaths/min): 16 Body Mass Index (BMI): 18.9 Blood Pressure (mmHg): 128/54 Reference Range: 80 - 120 mg / dl Electronic Signature(s) Signed: 12/30/2015 3:37:03 PM By: Regan Lemming BSN, RN Entered By: Regan Lemming on 12/30/2015 13:05:54

## 2015-12-31 NOTE — Progress Notes (Signed)
MARESSA, TUNGATE ([id number]) Visit Report for 12/30/2015 Chief Complaint Document Details Patient Name: [name], [name] 12/30/2015 12:45 Date of Service: PM Medical Record [id number] Number: Patient Account Number: [id number] [birth year] (80 y.o. Afful, RN, BSN, Date of Birth/Sex: Treating RN: Female) [name] Primary Care Physician: Halina Maidens Other Clinician: Referring Physician: Halina Maidens Treating Jamyah Folk Physician/Extender: Suella Grove in Treatment: 4 Information Obtained from: Patient Chief Complaint Patient is at the clinic for treatment of an open pressure ulcer to her right calcaneum which has been there for about 4 weeks Electronic Signature(s) Signed: 12/30/2015 1:27:31 PM By: [name], FACS Entered By: [name] on 12/30/2015 13:27:31 [name] ([id number]) -------------------------------------------------------------------------------- Debridement Details Patient Name: [name] 12/30/2015 12:45 Date of Service: PM Medical Record [id number] Number: Patient Account Number: [id number] 1936-03-06 (81 y.o. Afful, RN, BSN, Date of Birth/Sex: Treating RN: Female) [place] Primary Care Physician: Halina Maidens Other Clinician: Referring Physician: Halina Maidens Treating Donne Robillard Physician/Extender: Suella Grove in Treatment: 4 Debridement Performed for Wound #4 Right,Medial Lower Leg Assessment: Performed By: Physician [name], MD Debridement: Debridement Pre-procedure Yes Verification/Time Out Taken: Start Time: 13:23 Pain Control: Lidocaine 4% Topical Solution Level: Skin/Subcutaneous Tissue Total Area Debrided (L x 1 (cm) x 4 (cm) = 4 (cm) W): Tissue and other Viable, Non-Viable, Fibrin/Slough, Subcutaneous material debrided: Instrument: Curette Bleeding: Minimum Hemostasis Achieved: Pressure End Time: 13:27 Procedural Pain: 0 Post Procedural Pain: 0 Response to Treatment: Procedure was tolerated well Post  Debridement Measurements of Total Wound Length: (cm) 1 Width: (cm) 4 Depth: (cm) 0.1 Volume: (cm) 0.314 Post Procedure Diagnosis Same as Pre-procedure Electronic Signature(s) Signed: 12/30/2015 1:27:06 PM By: [name], FACS Signed: 12/30/2015 3:37:03 PM By: Regan Lemming BSN, RN Entered By: [name] on 12/30/2015 13:27:06 [name] ([id number]) -------------------------------------------------------------------------------- Debridement Details Patient Name: [name], [name] 12/30/2015 12:45 Date of Service: PM Medical Record [id number] Number: Patient Account Number: [id number] 10-24-1935 (80 y.o. Afful, RN, BSN, Date of Birth/Sex: Treating RN: Female) [place] Primary Care Physician: Halina Maidens Other Clinician: Referring Physician: Halina Maidens Treating Leandria Thier Physician/Extender: Suella Grove in Treatment: 4 Debridement Performed for Wound #5 Right,Posterior Calcaneus Assessment: Performed By: Physician [name], MD Debridement: Debridement Pre-procedure Yes Verification/Time Out Taken: Start Time: 13:18 Pain Control: Lidocaine 4% Topical Solution Level: Skin/Subcutaneous Tissue Total Area Debrided (L x 2.5 (cm) x 2 (cm) = 5 (cm) W): Tissue and other Viable, Non-Viable, Fibrin/Slough, Subcutaneous material debrided: Instrument: Curette Bleeding: Minimum Hemostasis Achieved: Pressure End Time: 13:23 Procedural Pain: 0 Post Procedural Pain: 0 Response to Treatment: Procedure was tolerated well Post Debridement Measurements of Total Wound Length: (cm) 2.5 Width: (cm) 2 Depth: (cm) 0.2 Volume: (cm) 0.785 Post Procedure Diagnosis Same as Pre-procedure Electronic Signature(s) Signed: 12/30/2015 1:27:23 PM By: [name], FACS Signed: 12/30/2015 3:37:03 PM By: Regan Lemming BSN, RN Entered By: [name] on 12/30/2015 13:27:23 [name]  ([id number]) -------------------------------------------------------------------------------- HPI Details Patient Name: [name], [name] 12/30/2015 12:45 Date of Service: PM Medical Record [id number] Number: Patient Account Number: [id number] 10/30/1935 (80 y.o. Afful, RN, BSN, Date of Birth/Sex: Treating RN: Female) [name] Primary Care Physician: Halina Maidens Other Clinician: Referring Physician: Halina Maidens Treating Jerimyah Vandunk Physician/Extender: Suella Grove in Treatment: 4 History of Present Illness Location: right posterior heel, and both lower extremities Quality: Patient reports experiencing a sharp pain to affected area(s). Severity: Patient states wound (s) are getting better. Duration: Patient has had the wound for < 4 weeks prior to presenting for treatment Timing: Pain in wound is  Intermittent (comes and goes Context: The wound appeared gradually over time while she was in hospital with a right hip fracture Modifying Factors: Consults to this date include:was seen by her PCP and put on some antibiotics a while ago Associated Signs and Symptoms: Patient reports having difficulty standing for long periods. HPI Description: 80 year old patient referred who was recently discharged in April of this year with a left heel ulcer now comes with a right heel ulcer and some ulcerations in both lower extremities which have all come along during her recent hospitalization which she had a right hip fracture. She has a past medical history of COPD, diabetes mellitus type 2 with chronic kidney disease, multi-infarct dementia, anxiety, urinary retention, paroxysmal atrial fibrillation. She has also had moderate malnutrition, multi-infarct dementia, congestive heart failure, left displaced femoral neck fracture, systemic lupus erythematosus. She is also status total abdominal hysterectomy with bilateral salpingo-oophorectomy, EGDs, hip arthroplasty on the left, cataract surgery. She has  been a from a smoker and quit in May 2013 and she smoked for about 40 years. recently admitted to [facility] between January 19 and 07/03/2015 12/09/2015 -- o right foot x-ray -- IMPRESSION:Soft tissue ulceration of the heel. No underlying erosive bony abnormality. Diffuse degenerative change. No acute bony abnormality. 12/30/2015-- admitted to the hospital between July 12 and July 18 of 2017 and was treated for cerebral infarction, infected decubitus ulcer and aspiration pneumonia of the right lung. RI was negative for stroke and was thought to be secondary to pneumonia. She was treated with clindamycin. Patient was discharged home on Keflex and Zyvox. Dr. Lucky Cowboy took her for an aortogram and a right lower extremity runoff and found to 30% stenosis in the mid to distal SFA but no significant stenosis in the popliteal artery and the SFA. There was a two-vessel runoff through the anterior tibial artery and peroneal artery without significant stenosis. felt her perfusion was adequate for wound healing. Electronic Signature(s) Signed: 12/30/2015 1:42:45 PM By: [name], FACS Entered By: [name] on 12/30/2015 13:42:45 [name] ([name] ([id number]) -------------------------------------------------------------------------------- Physical Exam Details Patient Name: [name], [name] 12/30/2015 12:45 Date of Service: PM Medical Record [id number] Number: Patient Account Number: [id number] 1935-10-17 (79 y.o. Afful, RN, BSN, Date of Birth/Sex: Treating RN: Female) [name] Primary Care Physician: Halina Maidens Other Clinician: Referring Physician: Halina Maidens Treating Taryll Reichenberger Physician/Extender: Suella Grove in Treatment: 4 Constitutional . Pulse regular. Respirations normal and unlabored. Afebrile. . Eyes Nonicteric. Reactive to light. Ears, Nose, Mouth, and Throat Lips, teeth, and gums WNL.Marland Kitchen Moist mucosa without  lesions. Neck supple and nontender. No palpable supraclavicular or cervical adenopathy. Normal sized without goiter. Respiratory WNL. No retractions.. Cardiovascular Pedal Pulses WNL. No clubbing, cyanosis or edema. Lymphatic No adneopathy. No adenopathy. No adenopathy. Musculoskeletal Adexa without tenderness or enlargement.. Digits and nails w/o clubbing, cyanosis, infection, petechiae, ischemia, or inflammatory conditions.. Integumentary (Hair, Skin) No suspicious lesions. No crepitus or fluctuance. No peri-wound warmth or erythema. No masses.Marland Kitchen Psychiatric Judgement and insight Intact.. No evidence of depression, anxiety, or agitation.. Notes using a #3 curet I have debrided the wound on the dorsum of the right ankle and the right heel and a lot of debris was removed and minimal bleeding controlled with pressure Electronic Signature(s) Signed: 12/30/2015 1:43:20 PM By: [name], FACS Entered By: [name] on 12/30/2015 13:43:19 [name] ([id number]) -------------------------------------------------------------------------------- Physician Orders Details Patient Name: [name], [name] 12/30/2015 12:45 Date of Service: PM Medical Record [id number] Number:  Patient Account Number: [id number] [birth year] (79 y.o. Afful, RN, BSN, Date of Birth/Sex: Treating RN: Female) [place] Primary Care Physician: Halina Maidens Other Clinician: Referring Physician: Halina Maidens Treating Harmonee Tozer Physician/Extender: Suella Grove in Treatment: 4 Verbal / Phone Orders: Yes Clinician: Afful, RN, BSN, Rita Read Back and Verified: Yes Diagnosis Coding Wound Cleansing Wound #4 Right,Medial Lower Leg o Clean wound with Normal Saline. - in clinic o Cleanse wound with mild soap and water Wound #5 Right,Posterior Calcaneus o Clean wound with Normal Saline. - in clinic o Cleanse wound with mild soap and water Anesthetic Wound #4 Right,Medial Lower Leg o Topical  Lidocaine 4% cream applied to wound bed prior to debridement Wound #5 Right,Posterior Calcaneus o Topical Lidocaine 4% cream applied to wound bed prior to debridement Primary Wound Dressing Wound #5 Right,Posterior Calcaneus o Santyl Ointment o Prisma Ag Wound #4 Right,Medial Lower Leg o Prisma Ag Secondary Dressing Wound #4 Right,Medial Lower Leg o Gauze and Kerlix/Conform Wound #5 Right,Posterior Calcaneus o Dry Gauze o Foam - foam heel cup Dressing Change Frequency Wound #4 Right,Medial Lower Leg ASHBY, LIFF ([id number]) o Change dressing every day. Wound #5 Right,Posterior Calcaneus o Change dressing every day. Follow-up Appointments o Return Appointment in 1 week. Off-Loading o Other: - float heels with pillow under calves when in bed; wear pressure relieving boots while in bed Electronic Signature(s) Signed: 12/30/2015 3:37:03 PM By: Regan Lemming BSN, RN Signed: 12/30/2015 4:28:14 PM By: [name], FACS Entered By: Regan Lemming on 12/30/2015 13:24:39 [name] ([id number]) -------------------------------------------------------------------------------- Problem List Details Patient Name: [name], [name] 12/30/2015 12:45 Date of Service: PM Medical Record [id number] Number: Patient Account Number: [id number] 1936/03/26 (80 y.o. Afful, RN, BSN, Date of Birth/Sex: Treating RN: Female) [name] Primary Care Physician: Halina Maidens Other Clinician: Referring Physician: Halina Maidens Treating Liora Myles Physician/Extender: Suella Grove in Treatment: 4 Active Problems ICD-10 Encounter Code Description Active Date Diagnosis E11.621 Type 2 diabetes mellitus with foot ulcer 12/02/2015 Yes L89.613 Pressure ulcer of right heel, stage 3 12/02/2015 Yes L97.212 Non-pressure chronic ulcer of right calf with fat layer 12/02/2015 Yes exposed L97.211 Non-pressure chronic ulcer of right calf limited to 12/02/2015 Yes breakdown of skin Z79.01  Long term (current) use of anticoagulants 12/02/2015 Yes Inactive Problems Resolved Problems Electronic Signature(s) Signed: 12/30/2015 1:26:47 PM By: [name], FACS Entered By: [name] on 12/30/2015 13:26:46 [name] ([id number]) -------------------------------------------------------------------------------- Progress Note Details Patient Name: [name] 12/30/2015 12:45 Date of Service: PM Medical Record [id number] Number: Patient Account Number: [id number] 1935/07/28 (80 y.o. Afful, RN, BSN, Date of Birth/Sex: Treating RN: Female) [name] Primary Care Physician: Halina Maidens Other Clinician: Referring Physician: Halina Maidens Treating Sidney Kann Physician/Extender: Suella Grove in Treatment: 4 Subjective Chief Complaint Information obtained from Patient Patient is at the clinic for treatment of an open pressure ulcer to her right calcaneum which has been there for about 4 weeks History of Present Illness (HPI) The following HPI elements were documented for the patient's wound: Location: right posterior heel, and both lower extremities Quality: Patient reports experiencing a sharp pain to affected area(s). Severity: Patient states wound (s) are getting better. Duration: Patient has had the wound for < 4 weeks prior to presenting for treatment Timing: Pain in wound is Intermittent (comes and goes Context: The wound appeared gradually over time while she was in hospital with a right hip fracture Modifying Factors: Consults to this date include:was seen by her PCP and put on some antibiotics a while ago Associated Signs and  Symptoms: Patient reports having difficulty standing for long periods. 80 year old patient referred who was recently discharged in April of this year with a left heel ulcer now comes with a right heel ulcer and some ulcerations in both lower extremities which have all come along during her recent hospitalization which she had a  right hip fracture. She has a past medical history of COPD, diabetes mellitus type 2 with chronic kidney disease, multi-infarct dementia, anxiety, urinary retention, paroxysmal atrial fibrillation. She has also had moderate malnutrition, multi-infarct dementia, congestive heart failure, left displaced femoral neck fracture, systemic lupus erythematosus. She is also status total abdominal hysterectomy with bilateral salpingo-oophorectomy, EGDs, hip arthroplasty on the left, cataract surgery. She has been a from a smoker and quit in May 2013 and she smoked for about 40 years. recently admitted to [facility] between January 19 and 07/03/2015 12/09/2015 -- right foot x-ray -- IMPRESSION:Soft tissue ulceration of the heel. No underlying erosive bony abnormality. Diffuse degenerative change. No acute bony abnormality. 12/30/2015-- admitted to the hospital between July 12 and July 18 of 2017 and was treated for cerebral infarction, infected decubitus ulcer and aspiration pneumonia of the right lung. RI was negative for stroke and was thought to be secondary to pneumonia. She was treated with clindamycin. Patient was discharged home on Keflex and Zyvox. [name] ([id number]) Dr. Lucky Cowboy took her for an aortogram and a right lower extremity runoff and found to 30% stenosis in the mid to distal SFA but no significant stenosis in the popliteal artery and the SFA. There was a two-vessel runoff through the anterior tibial artery and peroneal artery without significant stenosis. felt her perfusion was adequate for wound healing. Objective Constitutional Pulse regular. Respirations normal and unlabored. Afebrile. Vitals Time Taken: 1:05 PM, Height: 64 in, Weight: 110 lbs, BMI: 18.9, Temperature: 97.9 F, Pulse: 57 bpm, Respiratory Rate: 16 breaths/min, Blood Pressure: 128/54 mmHg. Eyes Nonicteric. Reactive to light. Ears, Nose, Mouth, and Throat Lips, teeth, and gums WNL.Marland Kitchen  Moist mucosa without lesions. Neck supple and nontender. No palpable supraclavicular or cervical adenopathy. Normal sized without goiter. Respiratory WNL. No retractions.. Cardiovascular Pedal Pulses WNL. No clubbing, cyanosis or edema. Lymphatic No adneopathy. No adenopathy. No adenopathy. Musculoskeletal Adexa without tenderness or enlargement.. Digits and nails w/o clubbing, cyanosis, infection, petechiae, ischemia, or inflammatory conditions.Marland Kitchen Psychiatric Judgement and insight Intact.. No evidence of depression, anxiety, or agitation.. General Notes: using a #3 curet I have debrided the wound on the dorsum of the right ankle and the right heel and a lot of debris was removed and minimal bleeding controlled with pressure Integumentary (Hair, Skin) No suspicious lesions. No crepitus or fluctuance. No peri-wound warmth or erythema. No masses.Marland Kitchen [name], [name] ([id number]) Wound #2 status is Healed - Epithelialized. Original cause of wound was Gradually Appeared. The wound is located on the Left,Anterior Lower Leg. The wound measures 0cm length x 0cm width x 0cm depth; 0cm^2 area and 0cm^3 volume. The wound is limited to skin breakdown. There is no tunneling or undermining noted. There is a none present amount of drainage noted. The wound margin is distinct with the outline attached to the wound base. There is no granulation within the wound bed. There is no necrotic tissue within the wound bed. The periwound skin appearance exhibited: Dry/Scaly. The periwound skin appearance did not exhibit: Callus, Crepitus, Excoriation, Fluctuance, Friable, Induration, Localized Edema, Rash, Scarring, Maceration, Moist, Atrophie Blanche, Cyanosis, Ecchymosis, Hemosiderin Staining, Mottled, Pallor, Rubor, Erythema. Wound #3 status is Healed -  Epithelialized. Original cause of wound was Gradually Appeared. The wound is located on the Right,Anterior Lower Leg. The wound measures 0cm length x 0cm width x  0cm depth; 0cm^2 area and 0cm^3 volume. The wound is limited to skin breakdown. There is no tunneling or undermining noted. There is a none present amount of drainage noted. The wound margin is distinct with the outline attached to the wound base. There is no granulation within the wound bed. There is no necrotic tissue within the wound bed. The periwound skin appearance exhibited: Dry/Scaly. The periwound skin appearance did not exhibit: Callus, Crepitus, Excoriation, Fluctuance, Friable, Induration, Localized Edema, Rash, Scarring, Maceration, Moist, Atrophie Blanche, Cyanosis, Ecchymosis, Hemosiderin Staining, Mottled, Pallor, Rubor, Erythema. Periwound temperature was noted as No Abnormality. Wound #4 status is Open. Original cause of wound was Gradually Appeared. The wound is located on the Right,Medial Lower Leg. The wound measures 1cm length x 4cm width x 0.2cm depth; 3.142cm^2 area and 0.628cm^3 volume. The wound is limited to skin breakdown. There is no tunneling or undermining noted. There is a medium amount of serosanguineous drainage noted. The wound margin is distinct with the outline attached to the wound base. There is small (1-33%) pink, pale granulation within the wound bed. There is a large (67-100%) amount of necrotic tissue within the wound bed including Adherent Slough. The periwound skin appearance exhibited: Localized Edema, Moist. The periwound skin appearance did not exhibit: Callus, Crepitus, Excoriation, Fluctuance, Friable, Induration, Rash, Scarring, Dry/Scaly, Maceration, Atrophie Blanche, Cyanosis, Ecchymosis, Hemosiderin Staining, Mottled, Pallor, Rubor, Erythema. Periwound temperature was noted as No Abnormality. The periwound has tenderness on palpation. Wound #5 status is Open. Original cause of wound was Gradually Appeared. The wound is located on the Right,Posterior Calcaneus. The wound measures 2.5cm length x 2cm width x 0.2cm depth; 3.927cm^2 area and  0.785cm^3 volume. The wound is limited to skin breakdown. There is no tunneling or undermining noted. There is a large amount of serosanguineous drainage noted. The wound margin is distinct with the outline attached to the wound base. There is medium (34-66%) pink, pale granulation within the wound bed. There is a medium (34-66%) amount of necrotic tissue within the wound bed including Adherent Slough. The periwound skin appearance exhibited: Localized Edema, Maceration, Moist, Mottled. The periwound skin appearance did not exhibit: Callus, Crepitus, Excoriation, Fluctuance, Friable, Induration, Rash, Scarring, Dry/Scaly, Atrophie Blanche, Cyanosis, Ecchymosis, Hemosiderin Staining, Pallor, Rubor, Erythema. Periwound temperature was noted as No Abnormality. Assessment [name], [name] ([id number]) Active Problems ICD-10 E11.621 - Type 2 diabetes mellitus with foot ulcer L89.613 - Pressure ulcer of right heel, stage 3 L97.212 - Non-pressure chronic ulcer of right calf with fat layer exposed L97.211 - Non-pressure chronic ulcer of right calf limited to breakdown of skin Z79.01 - Long term (current) use of anticoagulants Procedures Wound #4 Wound #4 is a Diabetic Wound/Ulcer of the Lower Extremity located on the Right,Medial Lower Leg . There was a Skin/Subcutaneous Tissue Debridement [id number] debridement with total area of 4 sq cm performed by [name], MD. with the following instrument(s): Curette to remove Viable and Non-Viable tissue/material including Fibrin/Slough and Subcutaneous after achieving pain control using Lidocaine 4% Topical Solution. A time out was conducted prior to the start of the procedure. A Minimum amount of bleeding was controlled with Pressure. The procedure was tolerated well with a pain level of 0 throughout and a pain level of 0 following the procedure. Post Debridement Measurements: 1cm length x 4cm width x 0.1cm depth; 0.314cm^3 volume. Post procedure  Diagnosis Wound #4: Same as Pre-Procedure Wound #5 Wound #5 is a Diabetic Wound/Ulcer of the Lower Extremity located on the Right,Posterior Calcaneus . There was a Skin/Subcutaneous Tissue Debridement [id number] debridement with total area of 5 sq cm performed by [name], MD. with the following instrument(s): Curette to remove Viable and Non-Viable tissue/material including Fibrin/Slough and Subcutaneous after achieving pain control using Lidocaine 4% Topical Solution. A time out was conducted prior to the start of the procedure. A Minimum amount of bleeding was controlled with Pressure. The procedure was tolerated well with a pain level of 0 throughout and a pain level of 0 following the procedure. Post Debridement Measurements: 2.5cm length x 2cm width x 0.2cm depth; 0.785cm^3 volume. Post procedure Diagnosis Wound #5: Same as Pre-Procedure Plan Wound Cleansing: Wound #4 Right,Medial Lower Leg: Clean wound with Normal Saline. - in clinic Cleanse wound with mild soap and water [name], [name] ([id number]) Wound #5 Right,Posterior Calcaneus: Clean wound with Normal Saline. - in clinic Cleanse wound with mild soap and water Anesthetic: Wound #4 Right,Medial Lower Leg: Topical Lidocaine 4% cream applied to wound bed prior to debridement Wound #5 Right,Posterior Calcaneus: Topical Lidocaine 4% cream applied to wound bed prior to debridement Primary Wound Dressing: Wound #5 Right,Posterior Calcaneus: Santyl Ointment Prisma Ag Wound #4 Right,Medial Lower Leg: Prisma Ag Secondary Dressing: Wound #4 Right,Medial Lower Leg: Gauze and Kerlix/Conform Wound #5 Right,Posterior Calcaneus: Dry Gauze Foam - foam heel cup Dressing Change Frequency: Wound #4 Right,Medial Lower Leg: Change dressing every day. Wound #5 Right,Posterior Calcaneus: Change dressing every day. Follow-up Appointments: Return Appointment in 1 week. Off-Loading: Other: - float heels with pillow under  calves when in bed; wear pressure relieving boots while in bed I have recommended: 1. Santyl ointment to the wounds on the right heel. 2. Prisma AG on the right lower extremity to be covered with a bordered foam. 3. inpatient admission notes and the arterial intervention or studies reviewed 4. Good control of her diabetes 5. Adequate nutrition with protein supplements, vitamin A, vitamin C and zinc 6. Regular visits to the wound center The patient and her husband have read all questions answered and will be compliant Electronic Signature(s) [name], [name] ([id number]) Signed: 12/30/2015 1:44:49 PM By: [name], FACS Entered By: [name] on 12/30/2015 13:44:49 [name] ([id number]) -------------------------------------------------------------------------------- [employer] Details Patient Name: [name] Date of Service: 12/30/2015 Medical Record Patient Account Number: [id number] Number: [place], RN, BSN, Treating RN: 1935-06-16 (80 y.o. [name] Date of Birth/Sex: Female) Other Clinician: Primary Care Physician: Halina Maidens Treating [name] Referring Physician: Halina Maidens Physician/Extender: Suella [place] in Treatment: 4 Diagnosis Coding ICD-10 Codes Code Description E11.621 Type 2 diabetes mellitus with foot ulcer L89.613 Pressure ulcer of right heel, stage 3 L97.212 Non-pressure chronic ulcer of right calf with fat layer exposed L97.211 Non-pressure chronic ulcer of right calf limited to breakdown of skin Z79.01 Long term (current) use of anticoagulants Facility Procedures CPT4 Code Description: [id number] - DEB SUBQ TISSUE 20 SQ CM/< ICD-10 Description Diagnosis E11.621 Type 2 diabetes mellitus with foot ulcer L89.613 Pressure ulcer of right heel, stage 3 L97.212 Non-pressure chronic ulcer of right calf with fat l  L97.211 Non-pressure chronic ulcer of right calf limited to Modifier: ayer exposed breakdown of s Quantity: 1  kin Physician Procedures CPT4 Code Description: [id number] - WC PHYS LEVEL 3 - EST PT ICD-10 Description Diagnosis E11.621 Type 2 diabetes mellitus with foot ulcer L89.613 Pressure ulcer of right heel, stage 3  [id number] Non-pressure chronic ulcer of right calf with fat l  L97.211 Non-pressure chronic ulcer of right calf limited to Modifier: 25 ayer exposed breakdown of sk Quantity: 1 in CPT4 Code Description: [id number] 11042 - WC PHYS SUBQ TISS 20 SQ CM ICD-10 Description Diagnosis E11.621 Type 2 diabetes mellitus with foot ulcer MARYCARMEN, BLYTH ([id number]) Modifier: Quantity: 1 Electronic Signature(s) Signed: 12/30/2015 1:45:41 PM By: [name], FACS Entered By: [name] on 12/30/2015 13:45:41

## 2016-01-01 ENCOUNTER — Telehealth: Payer: Self-pay | Admitting: Unknown Physician Specialty

## 2016-01-01 DIAGNOSIS — L89890 Pressure ulcer of other site, unstageable: Secondary | ICD-10-CM | POA: Diagnosis not present

## 2016-01-01 DIAGNOSIS — F0390 Unspecified dementia without behavioral disturbance: Secondary | ICD-10-CM | POA: Diagnosis not present

## 2016-01-01 DIAGNOSIS — L89610 Pressure ulcer of right heel, unstageable: Secondary | ICD-10-CM | POA: Diagnosis not present

## 2016-01-01 DIAGNOSIS — L89620 Pressure ulcer of left heel, unstageable: Secondary | ICD-10-CM | POA: Diagnosis not present

## 2016-01-01 DIAGNOSIS — S72001D Fracture of unspecified part of neck of right femur, subsequent encounter for closed fracture with routine healing: Secondary | ICD-10-CM | POA: Diagnosis not present

## 2016-01-01 DIAGNOSIS — I5032 Chronic diastolic (congestive) heart failure: Secondary | ICD-10-CM | POA: Diagnosis not present

## 2016-01-01 NOTE — Telephone Encounter (Signed)
Call from on call service (not the RN) that home health nurse wants to add Robitussin to treatment.  On call service could not take a verbal and nurse was paged 3 times without an answer.  If they call back, Robitussin OK.

## 2016-01-03 DIAGNOSIS — L89890 Pressure ulcer of other site, unstageable: Secondary | ICD-10-CM | POA: Diagnosis not present

## 2016-01-03 DIAGNOSIS — L89610 Pressure ulcer of right heel, unstageable: Secondary | ICD-10-CM | POA: Diagnosis not present

## 2016-01-03 DIAGNOSIS — I5032 Chronic diastolic (congestive) heart failure: Secondary | ICD-10-CM | POA: Diagnosis not present

## 2016-01-03 DIAGNOSIS — F0390 Unspecified dementia without behavioral disturbance: Secondary | ICD-10-CM | POA: Diagnosis not present

## 2016-01-03 DIAGNOSIS — L89620 Pressure ulcer of left heel, unstageable: Secondary | ICD-10-CM | POA: Diagnosis not present

## 2016-01-03 DIAGNOSIS — S72001D Fracture of unspecified part of neck of right femur, subsequent encounter for closed fracture with routine healing: Secondary | ICD-10-CM | POA: Diagnosis not present

## 2016-01-05 ENCOUNTER — Inpatient Hospital Stay: Payer: Medicare Other | Admitting: Internal Medicine

## 2016-01-05 ENCOUNTER — Encounter: Payer: Self-pay | Admitting: Internal Medicine

## 2016-01-05 DIAGNOSIS — L89620 Pressure ulcer of left heel, unstageable: Secondary | ICD-10-CM | POA: Diagnosis not present

## 2016-01-05 DIAGNOSIS — L89890 Pressure ulcer of other site, unstageable: Secondary | ICD-10-CM | POA: Diagnosis not present

## 2016-01-05 DIAGNOSIS — L89610 Pressure ulcer of right heel, unstageable: Secondary | ICD-10-CM | POA: Diagnosis not present

## 2016-01-05 DIAGNOSIS — I5032 Chronic diastolic (congestive) heart failure: Secondary | ICD-10-CM | POA: Diagnosis not present

## 2016-01-05 DIAGNOSIS — F0390 Unspecified dementia without behavioral disturbance: Secondary | ICD-10-CM | POA: Diagnosis not present

## 2016-01-05 DIAGNOSIS — S72001D Fracture of unspecified part of neck of right femur, subsequent encounter for closed fracture with routine healing: Secondary | ICD-10-CM | POA: Diagnosis not present

## 2016-01-06 ENCOUNTER — Encounter: Payer: Self-pay | Admitting: Internal Medicine

## 2016-01-06 ENCOUNTER — Encounter: Payer: Medicare Other | Admitting: Surgery

## 2016-01-06 ENCOUNTER — Ambulatory Visit (INDEPENDENT_AMBULATORY_CARE_PROVIDER_SITE_OTHER): Payer: Medicare Other | Admitting: Internal Medicine

## 2016-01-06 VITALS — BP 128/62 | HR 60 | Temp 97.8°F | Ht 64.0 in | Wt 98.2 lb

## 2016-01-06 DIAGNOSIS — F0280 Dementia in other diseases classified elsewhere without behavioral disturbance: Secondary | ICD-10-CM

## 2016-01-06 DIAGNOSIS — F0150 Vascular dementia without behavioral disturbance: Secondary | ICD-10-CM

## 2016-01-06 DIAGNOSIS — L97211 Non-pressure chronic ulcer of right calf limited to breakdown of skin: Secondary | ICD-10-CM | POA: Diagnosis not present

## 2016-01-06 DIAGNOSIS — E1122 Type 2 diabetes mellitus with diabetic chronic kidney disease: Secondary | ICD-10-CM | POA: Diagnosis not present

## 2016-01-06 DIAGNOSIS — I480 Paroxysmal atrial fibrillation: Secondary | ICD-10-CM | POA: Diagnosis not present

## 2016-01-06 DIAGNOSIS — J189 Pneumonia, unspecified organism: Secondary | ICD-10-CM | POA: Diagnosis not present

## 2016-01-06 DIAGNOSIS — N181 Chronic kidney disease, stage 1: Secondary | ICD-10-CM | POA: Diagnosis not present

## 2016-01-06 DIAGNOSIS — J449 Chronic obstructive pulmonary disease, unspecified: Secondary | ICD-10-CM | POA: Diagnosis not present

## 2016-01-06 DIAGNOSIS — G309 Alzheimer's disease, unspecified: Secondary | ICD-10-CM

## 2016-01-06 DIAGNOSIS — L97811 Non-pressure chronic ulcer of other part of right lower leg limited to breakdown of skin: Secondary | ICD-10-CM | POA: Diagnosis not present

## 2016-01-06 DIAGNOSIS — L97411 Non-pressure chronic ulcer of right heel and midfoot limited to breakdown of skin: Secondary | ICD-10-CM | POA: Diagnosis not present

## 2016-01-06 DIAGNOSIS — E11621 Type 2 diabetes mellitus with foot ulcer: Secondary | ICD-10-CM | POA: Diagnosis not present

## 2016-01-06 DIAGNOSIS — L89613 Pressure ulcer of right heel, stage 3: Secondary | ICD-10-CM

## 2016-01-06 DIAGNOSIS — E11622 Type 2 diabetes mellitus with other skin ulcer: Secondary | ICD-10-CM | POA: Diagnosis not present

## 2016-01-06 DIAGNOSIS — L97212 Non-pressure chronic ulcer of right calf with fat layer exposed: Secondary | ICD-10-CM | POA: Diagnosis not present

## 2016-01-06 DIAGNOSIS — Z7901 Long term (current) use of anticoagulants: Secondary | ICD-10-CM | POA: Diagnosis not present

## 2016-01-06 DIAGNOSIS — J181 Lobar pneumonia, unspecified organism: Secondary | ICD-10-CM

## 2016-01-06 MED ORDER — METFORMIN HCL 500 MG PO TABS: 1500.0000 mg | ORAL_TABLET | Freq: Every day | ORAL | 5 refills | Status: DC

## 2016-01-06 NOTE — Progress Notes (Signed)
Date:  01/06/2016   Name:  [name]   DOB:  1935-11-13   MRN:  [id number]   Chief Complaint: Hospitalization Follow-up Patient was hospitalized at [facility] 12/22/15 to [id number] with metabolic encephalopathy, pneumonia, bacteremia and decubitus ulcer of the heel. She was treated for CAP.  Wound culture was positive for MRSA and Klebsiella.  She was treated with Keflex and Linezolid.  She has one more day to take.  She had a follow up CXR that showed the infiltrate to be stable. She is home now with [facility] RN and PT.  She also has an aid from [facility] 6 days a week. Palliative Care consult in home.  DNR in place.    Diabetes  She presents for her follow-up diabetic visit. She has type 2 diabetes mellitus. Her disease course has been improving. Hypoglycemia symptoms include confusion. Pertinent negatives for hypoglycemia include no headaches. Associated symptoms include foot ulcerations, visual change and weakness. Pertinent negatives for diabetes include no chest pain, no fatigue and no foot paresthesias. Symptoms are improving (husband increased metformin to 1000 mg per day for the past 2 weeks.).  Pneumonia  She complains of cough. There is no difficulty breathing, hemoptysis, shortness of breath, sputum production or wheezing. The current episode started 1 to 4 weeks ago. The problem occurs rarely. The problem has been resolved. Associated symptoms include appetite change. Pertinent negatives include no chest pain, headaches or trouble swallowing.  Heel Ulcer - during hospital stay had arterial dopplers on LEs.  Circulation was decreased but there was no stenosis or obstruction.  Her ulcer was debrided surgically.  She was treated with antibiotics.  Per patient and husband, the ulcer is much improved. She is getting dressing changes daily from [facility]. Dementia - stable; encephalopathy resolved.  MRI showed no evidence of CVA.  MS changes thought to be due to infection vs TIA. She has a  Neurology appt at [facility] in September for a second opinion.  A Fibrillation - continues on DOAC and amiodarone. She wants to stop DOAC due to easy bruising but need to therapy is discussed and explained.  Lab Results  Component Value Date   HGBA1C 7.6 (H) 12/22/2015     Review of Systems  Constitutional: Positive for appetite change. Negative for fatigue and unexpected weight change.  HENT: Negative for hearing loss and trouble swallowing.   Eyes: Positive for visual disturbance.  Respiratory: Positive for cough. Negative for hemoptysis, sputum production, chest tightness, shortness of breath and wheezing.   Cardiovascular: Negative for chest pain, palpitations and leg swelling.  Gastrointestinal: Positive for nausea. Negative for abdominal pain and diarrhea.  Musculoskeletal: Positive for arthralgias (hips and back).  Neurological: Positive for weakness. Negative for numbness and headaches.  Psychiatric/Behavioral: Positive for confusion. Negative for sleep disturbance.    Patient Active Problem List   Diagnosis Date Noted  . DNR (do not resuscitate) 12/29/2015  . Palliative care encounter 12/29/2015  . Dementia   . Demand ischemia ([place]) 12/23/2015  . Metabolic encephalopathy [id number]  . Sepsis ([place]) 12/23/2015  . Pneumonia 12/22/2015  . Closed right hip fracture ([place]) 11/04/2015  . Chronic respiratory failure with hypoxia ([place]) 10/27/2015  . Mixed Alzheimer's and vascular dementia 07/29/2015  . Type 2 diabetes mellitus with stage 1 chronic kidney disease, without long-term current use of insulin ([place]) 07/16/2015  . Tremor observed on examination 07/16/2015  . Urinary retention 07/12/2015  . Stage III pressure ulcer of right heel ([place]) 07/12/2015  . Malnutrition  of moderate degree 07/03/2015  . 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 Paroxysmal atrial fibrillation ([place]) 06/25/2015  . Chronic diastolic CHF (congestive heart failure) ([place])   . Left displaced femoral neck  fracture ([place])   . Fall   . GERD (gastroesophageal reflux disease) 03/26/2015  . Anxiety 03/26/2015  . Gastroduodenal ulcer 01/05/2015  . Difficulty swallowing solids   . Esophageal candidiasis ([place])   . Acquired hypothyroidism 08/18/2014  . Systemic lupus erythematosus ([place]) 04/03/2014    Prior to Admission medications   Medication Sig Start Date End Date Taking? Authorizing Provider  albuterol (PROVENTIL HFA;VENTOLIN HFA) 108 (90 Base) MCG/ACT inhaler Inhale 2 puffs into the lungs 4 (four) times daily as needed for wheezing or shortness of breath.   Yes Historical Provider, MD  albuterol (PROVENTIL) (2.5 MG/3ML) 0.083% nebulizer solution Take 2.5 mg by nebulization every 6 (six) hours as needed for wheezing or shortness of breath. Reported on 07/09/2015   Yes Historical Provider, MD  amiodarone (PACERONE) 200 MG tablet Take 1 tablet (200 mg total) by mouth daily. 06/22/15  Yes Glean Hess, MD  cephALEXin (KEFLEX) 250 MG capsule Take 1 capsule (250 mg total) by mouth 3 (three) times daily. 12/28/15  Yes Srikar Sudini, MD  collagenase (SANTYL) ointment Apply topically daily. 12/28/15  Yes Srikar Sudini, MD  docusate sodium (COLACE) 100 MG capsule Take 1 capsule (100 mg total) by mouth 2 (two) times daily. 11/10/15  Yes Srikar Sudini, MD  feeding supplement, ENSURE ENLIVE, (ENSURE ENLIVE) LIQD Take 237 mLs by mouth 2 (two) times daily between meals. 11/10/15  Yes Srikar Sudini, MD  ferrous sulfate 325 (65 FE) MG tablet Take 1 tablet (325 mg total) by mouth 2 (two) times daily with a meal. 11/10/15  Yes Srikar Sudini, MD  JANUVIA 100 MG tablet Take 0.5 tablets (50 mg total) by mouth daily. 06/22/15  Yes Glean Hess, MD  linezolid (ZYVOX) 600 MG tablet Take 1 tablet (600 mg total) by mouth every 12 (twelve) hours. 12/28/15  Yes Srikar Sudini, MD  LORazepam (ATIVAN) 1 MG tablet Take 1 tablet (1 mg total) by mouth at bedtime. 11/10/15  Yes Srikar Sudini, MD  memantine (NAMENDA) 5 MG tablet Take 5  mg by mouth 2 (two) times daily.   Yes Historical Provider, MD  metFORMIN (GLUCOPHAGE-XR) 500 MG 24 hr tablet Take 1 tablet (500 mg total) by mouth daily before supper. 12/20/15  Yes Glean Hess, MD  metoprolol tartrate (LOPRESSOR) 25 MG tablet Take 25 mg by mouth 2 (two) times daily.   Yes Historical Provider, MD  mometasone-formoterol (DULERA) 100-5 MCG/ACT AERO Inhale 2 puffs into the lungs 2 (two) times daily. 07/16/15  Yes Glean Hess, MD  oxyCODONE (OXY IR/ROXICODONE) 5 MG immediate release tablet Take 1 tablet (5 mg total) by mouth every 4 (four) hours as needed for severe pain ((for MODERATE breakthrough pain)). 11/10/15  Yes Srikar Sudini, MD  Probiotic Product (ALIGN) 4 MG CAPS Take 1 capsule (4 mg total) by mouth daily. 06/22/15  Yes Glean Hess, MD  ranitidine (ZANTAC) 150 MG tablet Take 1 tablet (150 mg total) by mouth 2 (two) times daily. 06/22/15  Yes Glean Hess, MD  rivaroxaban (XARELTO) 20 MG TABS tablet Take 1 tablet (20 mg total) by mouth daily. 06/22/15  Yes Glean Hess, MD  senna (SENOKOT) 8.6 MG TABS tablet Take 1 tablet by mouth 2 (two) times daily.   Yes Historical Provider, MD  tiotropium (SPIRIVA) 18 MCG  inhalation capsule Place 1 capsule (18 mcg total) into inhaler and inhale daily. 06/22/15  Yes Glean Hess, MD    Allergies  Allergen Reactions  . Codeine Nausea And Vomiting  . Penicillins Hives    Has patient had a PCN reaction causing immediate rash, facial/tongue/throat swelling, SOB or lightheadedness with hypotension: no  Has patient had a PCN reaction causing severe rash involving mucus membranes or skin necrosis: no  Has patient had a PCN reaction that required hospitalization: no  Has patient had a PCN reaction occurring within the last 10 years: no  If all of the above answers are "NO", then may proceed with Cephalosporin use.   . Sulfa Antibiotics   . Doxycycline Rash  . Erythromycin Rash  . Latex Rash    Past Surgical History:    Procedure Laterality Date  . BIOPSY THYROID    . BREAST BIOPSY    . CATARACT EXTRACTION W/PHACO Left 03/23/2015   Procedure: CATARACT EXTRACTION PHACO AND INTRAOCULAR LENS PLACEMENT (IOC);  Surgeon: Birder Robson, MD;  Location: [facility] ORS;  Service: Ophthalmology;  Laterality: Left;  [place] 00:51   . CATARACT EXTRACTION W/PHACO Right 05/25/2015   Procedure: CATARACT EXTRACTION PHACO AND INTRAOCULAR LENS PLACEMENT (IOC);  Surgeon: Birder Robson, MD;  Location: [facility];  Service: Ophthalmology;  Laterality: Right;  [place] 00:57   . COLONOSCOPY  multiple  . ESOPHAGOGASTRODUODENOSCOPY  multiple  . ESOPHAGOGASTRODUODENOSCOPY (EGD) WITH PROPOFOL N/A 12/11/2014   Procedure: ESOPHAGOGASTRODUODENOSCOPY (EGD) WITH PROPOFOL;  Surgeon: [name], MD;  Location: [place];  Service: Endoscopy;  Laterality: N/A;  cytology brushing  . HIP ARTHROPLASTY Left 03/26/2015   Procedure: ARTHROPLASTY BIPOLAR HIP (HEMIARTHROPLASTY);  Surgeon: Earnestine Leys, MD;  Location: [facility];  Service: Orthopedics;  Laterality: Left;  . HIP ARTHROPLASTY Right 11/06/2015   Procedure: ARTHROPLASTY BIPOLAR HIP (HEMIARTHROPLASTY);  Surgeon: Thornton Park, MD;  Location: [facility];  Service: Orthopedics;  Laterality: Right;  . JOINT REPLACEMENT Left   . PERIPHERAL VASCULAR CATHETERIZATION N/A 12/27/2015   Procedure: Lower Extremity Angiography;  Surgeon: Algernon Huxley, MD;  Location: [place] [facility];  Service: Cardiovascular;  Laterality: N/A;  . PERIPHERAL VASCULAR CATHETERIZATION  12/27/2015   Procedure: Lower Extremity Intervention;  Surgeon: Algernon Huxley, MD;  Location: [place] [facility];  Service: Cardiovascular;;  . TOTAL ABDOMINAL HYSTERECTOMY W/ BILATERAL SALPINGOOPHORECTOMY  1990    Social History  Substance Use Topics  . Smoking status: Former Smoker    Packs/day: 0.25    Years: 40.00    Types: Cigarettes    Quit date: 10/11/2011  . Smokeless tobacco: Never Used     Comment: Quit 3 years  . Alcohol  use No     Medication list has been reviewed and updated.   Physical Exam  Constitutional: She is oriented to person, place, and time. She appears well-developed. No distress.  HENT:  Head: Normocephalic and atraumatic.  Neck: Normal range of motion. Neck supple.  Cardiovascular: Normal rate, regular rhythm and normal heart sounds.   Pulmonary/Chest: Effort normal. No respiratory distress. She has decreased breath sounds. She has no wheezes. She has no rales.  Abdominal: There is no tenderness.  Musculoskeletal: Normal range of motion.  Lymphadenopathy:    She has no cervical adenopathy.  Neurological: She is alert and oriented to person, place, and time.  Skin: Skin is warm and dry. No rash noted.  Dusky cool toes Dressing in place right heel  Psychiatric: She has a normal mood and affect. Her  speech is normal and behavior is normal. Thought content normal.  Nursing note and vitals reviewed.   BP 128/62 (BP Location: Right Arm, Patient Position: Sitting, Cuff Size: Normal)   Pulse 60   Temp 97.8 F (36.6 C)   Ht 5\' 4"  (1.626 m)   Wt 98 lb 3.2 oz (44.5 kg)   SpO2 95%   BMI 16.86 kg/m   Assessment and Plan: 1. Type 2 diabetes mellitus with stage 1 chronic kidney disease, without long-term current use of insulin (HCC) Increase metformin to 1500 mg per - husband will crush tablets Continue Januvia 50 mg daily - metFORMIN (GLUCOPHAGE) 500 MG tablet; Take 3 tablets (1,500 mg total) by mouth daily.  Dispense: 90 tablet; Refill: 5  2. Pneumonia due to methicillin resistant Staphylococcus aureus ([place]) Complete antibiotics Should have repeat CXR at next visit  3. Stage III pressure ulcer of right heel (HCC) Continue wound care and tight BS control  4. Paroxysmal atrial fibrillation (HCC) Remain on DOAC therapy  5. Mixed Alzheimer's and vascular dementia Continue supportive and Palliative care  Halina Maidens, MD [facility]  01/06/2016

## 2016-01-07 DIAGNOSIS — L89620 Pressure ulcer of left heel, unstageable: Secondary | ICD-10-CM | POA: Diagnosis not present

## 2016-01-07 DIAGNOSIS — S72001D Fracture of unspecified part of neck of right femur, subsequent encounter for closed fracture with routine healing: Secondary | ICD-10-CM | POA: Diagnosis not present

## 2016-01-07 DIAGNOSIS — I5032 Chronic diastolic (congestive) heart failure: Secondary | ICD-10-CM | POA: Diagnosis not present

## 2016-01-07 DIAGNOSIS — F0390 Unspecified dementia without behavioral disturbance: Secondary | ICD-10-CM | POA: Diagnosis not present

## 2016-01-07 DIAGNOSIS — L89610 Pressure ulcer of right heel, unstageable: Secondary | ICD-10-CM | POA: Diagnosis not present

## 2016-01-07 DIAGNOSIS — L89890 Pressure ulcer of other site, unstageable: Secondary | ICD-10-CM | POA: Diagnosis not present

## 2016-01-07 NOTE — Progress Notes (Addendum)
[name], [name] ([id number]) Visit Report for 01/06/2016 Chief Complaint Document Details Patient Name: [name], [name]. Date of Service: 01/06/2016 2:15 PM Medical Record Patient Account Number: [id number] Number: Afful, RN, BSN, Treating RN: 1935-06-23 (80 y.o. [name] Date of Birth/Sex: Female) Other Clinician: Primary Care Physician: Halina Maidens Treating [name] Referring Physician: Halina Maidens Physician/Extender: Suella Grove in Treatment: 5 Information Obtained from: Patient Chief Complaint Patient is at the clinic for treatment of an open pressure ulcer to her right calcaneum which has been there for about 4 weeks Electronic Signature(s) Signed: 01/06/2016 2:33:25 PM By: [name], FACS Entered By: [name] on 01/06/2016 14:33:25 [name] ([id number]) -------------------------------------------------------------------------------- HPI Details Patient Name: [name] Date of Service: 01/06/2016 2:15 PM Medical Record Patient Account Number: [id number] Number: Afful, RN, BSN, Treating RN: 1936/04/23 (80 y.o. [name] Date of Birth/Sex: Female) Other Clinician: Primary Care Physician: Halina Maidens Treating [name] Referring Physician: Halina Maidens Physician/Extender: Suella Grove in Treatment: 5 History of Present Illness Location: right posterior heel, and both lower extremities Quality: Patient reports experiencing a sharp pain to affected area(s). Severity: Patient states wound (s) are getting better. Duration: Patient has had the wound for < 4 weeks prior to presenting for treatment Timing: Pain in wound is Intermittent (comes and goes Context: The wound appeared gradually over time while she was in hospital with a right hip fracture Modifying Factors: Consults to this date include:was seen by her PCP and put on some antibiotics a while ago Associated Signs and Symptoms: Patient reports having difficulty  standing for long periods. HPI Description: 80 year old patient referred who was recently discharged in April of this year with a left heel ulcer now comes with a right heel ulcer and some ulcerations in both lower extremities which have all come along during her recent hospitalization which she had a right hip fracture. She has a past medical history of COPD, diabetes mellitus type 2 with chronic kidney disease, multi-infarct dementia, anxiety, urinary retention, paroxysmal atrial fibrillation. She has also had moderate malnutrition, multi-infarct dementia, congestive heart failure, left displaced femoral neck fracture, systemic lupus erythematosus. She is also status total abdominal hysterectomy with bilateral salpingo-oophorectomy, EGDs, hip arthroplasty on the left, cataract surgery. She has been a from a smoker and quit in May 2013 and she smoked for about 40 years. recently admitted to [facility] between January 19 and 07/03/2015 12/09/2015 -- o right foot x-ray -- IMPRESSION:Soft tissue ulceration of the heel. No underlying erosive bony abnormality. Diffuse degenerative change. No acute bony abnormality. 12/30/2015-- admitted to the hospital between July 12 and July 18 of 2017 and was treated for cerebral infarction, infected decubitus ulcer and aspiration pneumonia of the right lung. MRI was negative for stroke and was thought to be secondary to pneumonia. She was treated with clindamycin. Patient was discharged home on Keflex and Zyvox. Dr. Lucky Cowboy took her for an aortogram and a right lower extremity runoff and found to 30% stenosis in the mid to distal SFA but no significant stenosis in the popliteal artery and the SFA. There was a two-vessel runoff through the anterior tibial artery and peroneal artery without significant stenosis. felt her perfusion was adequate for wound healing. Electronic Signature(s) Signed: 01/06/2016 2:34:46 PM By: [name],  FACS Entered By: [name] on 01/06/2016 14:34:46 [name] ([name] ([id number]) -------------------------------------------------------------------------------- Physical Exam Details Patient Name: [name], [name] Date of Service: 01/06/2016 2:15 PM Medical Record Patient Account Number: [id number]  [id number] Number: Afful, RN, BSN, Treating RN: 1936/02/21 (80 y.o. [name] Date of Birth/Sex: Female) Other Clinician: Primary Care Physician: Halina Maidens Treating [name] Referring Physician: Halina Maidens Physician/Extender: Suella Grove in Treatment: 5 Constitutional . Pulse regular. Respirations normal and unlabored. Afebrile. . Eyes Nonicteric. Reactive to light. Ears, Nose, Mouth, and Throat Lips, teeth, and gums WNL.Marland Kitchen Moist mucosa without lesions. Neck supple and nontender. No palpable supraclavicular or cervical adenopathy. Normal sized without goiter. Respiratory WNL. No retractions.. Breath sounds WNL, No rubs, rales, rhonchi, or wheeze.. Cardiovascular Heart rhythm and rate regular, no murmur or gallop.. Pedal Pulses WNL. No clubbing, cyanosis or edema. Chest Breasts symmetical and no nipple discharge.. Breast tissue WNL, no masses, lumps, or tenderness.. Lymphatic No adneopathy. No adenopathy. No adenopathy. Musculoskeletal Adexa without tenderness or enlargement.. Digits and nails w/o clubbing, cyanosis, infection, petechiae, ischemia, or inflammatory conditions.. Integumentary (Hair, Skin) No suspicious lesions. No crepitus or fluctuance. No peri-wound warmth or erythema. No masses.Marland Kitchen Psychiatric Judgement and insight Intact.. No evidence of depression, anxiety, or agitation.. Notes wounds had a significant amount of slough and both wounds were scrubbed well with moist saline gauze and most of the debris removed. No sharp debridement was done today. Electronic Signature(s) Signed: 01/06/2016 2:52:13 PM By: [name],  FACS Entered By: [name] on 01/06/2016 14:52:12 [name] ([id number]) -------------------------------------------------------------------------------- Physician Orders Details Patient Name: [name] Date of Service: 01/06/2016 2:15 PM Medical Record Patient Account Number: [id number] Number: Afful, RN, BSN, Treating RN: [birth year] (80 y.o. [name] Date of Birth/Sex: Female) Other Clinician: Primary Care Physician: Halina Maidens Treating [name] Referring Physician: Halina Maidens Physician/Extender: Suella Grove in Treatment: 5 Verbal / Phone Orders: Yes Clinician: Afful, RN, BSN, Rita Read Back and Verified: Yes Diagnosis Coding ICD-10 Coding Code Description E11.621 Type 2 diabetes mellitus with foot ulcer L89.613 Pressure ulcer of right heel, stage 3 L97.212 Non-pressure chronic ulcer of right calf with fat layer exposed L97.211 Non-pressure chronic ulcer of right calf limited to breakdown of skin Z79.01 Long term (current) use of anticoagulants Wound Cleansing Wound #4 Right,Medial Lower Leg o Clean wound with Normal Saline. - in clinic o Cleanse wound with mild soap and water Wound #5 Right,Posterior Calcaneus o Clean wound with Normal Saline. - in clinic o Cleanse wound with mild soap and water Anesthetic Wound #4 Right,Medial Lower Leg o Topical Lidocaine 4% cream applied to wound bed prior to debridement Wound #5 Right,Posterior Calcaneus o Topical Lidocaine 4% cream applied to wound bed prior to debridement Primary Wound Dressing Wound #4 Right,Medial Lower Leg o Prisma Ag Wound #5 Right,Posterior Calcaneus o Santyl Ointment o Prisma Ag Secondary Dressing [name], [name] ([id number]) Wound #4 Right,Medial Lower Leg o Gauze and Kerlix/Conform Wound #5 Right,Posterior Calcaneus o Dry Gauze o Foam - foam heel cup Dressing Change Frequency Wound #4 Right,Medial Lower Leg o Change dressing every  day. Wound #5 Right,Posterior Calcaneus o Change dressing every day. Follow-up Appointments o Return Appointment in 1 week. Off-Loading o Other: - float heels with pillow under calves when in bed; wear pressure relieving boots while in bed Electronic Signature(s) Signed: 01/06/2016 4:12:41 PM By: [name], FACS Signed: 01/06/2016 4:44:30 PM By: Regan Lemming BSN, RN Entered By: Regan Lemming on 01/06/2016 14:48:43 [name] ([id number]) -------------------------------------------------------------------------------- Problem List Details Patient Name: [name]. Date of Service: 01/06/2016 2:15 PM Medical Record Patient Account Number: [id number] Number: Afful, RN, BSN, Treating RN: [birth year] (80 y.o. [name] Date of Birth/Sex: Female) Other Clinician:  Primary Care Physician: Halina Maidens Treating [name] Referring Physician: Halina Maidens Physician/Extender: Suella Grove in Treatment: 5 Active Problems ICD-10 Encounter Code Description Active Date Diagnosis E11.621 Type 2 diabetes mellitus with foot ulcer 12/02/2015 Yes L89.613 Pressure ulcer of right heel, stage 3 12/02/2015 Yes L97.212 Non-pressure chronic ulcer of right calf with fat layer 12/02/2015 Yes exposed L97.211 Non-pressure chronic ulcer of right calf limited to 12/02/2015 Yes breakdown of skin Z79.01 Long term (current) use of anticoagulants 12/02/2015 Yes Inactive Problems Resolved Problems Electronic Signature(s) Signed: 01/06/2016 2:33:18 PM By: [name], FACS Entered By: [name] on 01/06/2016 14:33:17 [name] ([id number]) -------------------------------------------------------------------------------- Progress Note Details Patient Name: [name] Date of Service: 01/06/2016 2:15 PM Medical Record Patient Account Number: [id number] Number: Afful, RN, BSN, Treating RN: Sep 08, 1935 (80 y.o. [name] Date of Birth/Sex: Female) Other  Clinician: Primary Care Physician: Halina Maidens Treating [name] Referring Physician: Halina Maidens Physician/Extender: Suella Grove in Treatment: 5 Subjective Chief Complaint Information obtained from Patient Patient is at the clinic for treatment of an open pressure ulcer to her right calcaneum which has been there for about 4 weeks History of Present Illness (HPI) The following HPI elements were documented for the patient's wound: Location: right posterior heel, and both lower extremities Quality: Patient reports experiencing a sharp pain to affected area(s). Severity: Patient states wound (s) are getting better. Duration: Patient has had the wound for < 4 weeks prior to presenting for treatment Timing: Pain in wound is Intermittent (comes and goes Context: The wound appeared gradually over time while she was in hospital with a right hip fracture Modifying Factors: Consults to this date include:was seen by her PCP and put on some antibiotics a while ago Associated Signs and Symptoms: Patient reports having difficulty standing for long periods. 80 year old patient referred who was recently discharged in April of this year with a left heel ulcer now comes with a right heel ulcer and some ulcerations in both lower extremities which have all come along during her recent hospitalization which she had a right hip fracture. She has a past medical history of COPD, diabetes mellitus type 2 with chronic kidney disease, multi-infarct dementia, anxiety, urinary retention, paroxysmal atrial fibrillation. She has also had moderate malnutrition, multi-infarct dementia, congestive heart failure, left displaced femoral neck fracture, systemic lupus erythematosus. She is also status total abdominal hysterectomy with bilateral salpingo-oophorectomy, EGDs, hip arthroplasty on the left, cataract surgery. She has been a from a smoker and quit in May 2013 and she smoked for about 40 years. recently  admitted to [facility] between January 19 and 07/03/2015 12/09/2015 -- right foot x-ray -- IMPRESSION:Soft tissue ulceration of the heel. No underlying erosive bony abnormality. Diffuse degenerative change. No acute bony abnormality. 12/30/2015-- admitted to the hospital between July 12 and July 18 of 2017 and was treated for cerebral infarction, infected decubitus ulcer and aspiration pneumonia of the right lung. MRI was negative for stroke and was thought to be secondary to pneumonia. She was treated with clindamycin. Patient was discharged home on Keflex and Zyvox. [name] ([id number]) Dr. Lucky Cowboy took her for an aortogram and a right lower extremity runoff and found to 30% stenosis in the mid to distal SFA but no significant stenosis in the popliteal artery and the SFA. There was a two-vessel runoff through the anterior tibial artery and peroneal artery without significant stenosis. felt her perfusion was adequate for wound healing. Objective Constitutional Pulse regular. Respirations normal and unlabored. Afebrile. Vitals  Time Taken: 2:29 PM, Height: 64 in, Weight: 110 lbs, BMI: 18.9, Temperature: 98.4 F, Pulse: 61 bpm, Respiratory Rate: 16 breaths/min, Blood Pressure: 131/44 mmHg. Eyes Nonicteric. Reactive to light. Ears, Nose, Mouth, and Throat Lips, teeth, and gums WNL.Marland Kitchen Moist mucosa without lesions. Neck supple and nontender. No palpable supraclavicular or cervical adenopathy. Normal sized without goiter. Respiratory WNL. No retractions.. Breath sounds WNL, No rubs, rales, rhonchi, or wheeze.. Cardiovascular Heart rhythm and rate regular, no murmur or gallop.. Pedal Pulses WNL. No clubbing, cyanosis or edema. Chest Breasts symmetical and no nipple discharge.. Breast tissue WNL, no masses, lumps, or tenderness.. Lymphatic No adneopathy. No adenopathy. No adenopathy. Musculoskeletal Adexa without tenderness or enlargement.. Digits and nails w/o  clubbing, cyanosis, infection, petechiae, ischemia, or inflammatory conditions.Marland Kitchen Psychiatric Judgement and insight Intact.. No evidence of depression, anxiety, or agitation.. General Notes: wounds had a significant amount of slough and both wounds were scrubbed well with moist saline gauze and most of the debris removed. No sharp debridement was done today. [name], [name] ([id number]) Integumentary (Hair, Skin) No suspicious lesions. No crepitus or fluctuance. No peri-wound warmth or erythema. No masses.. Wound #4 status is Open. Original cause of wound was Gradually Appeared. The wound is located on the Right,Medial Lower Leg. The wound measures 0.8cm length x 1cm width x 0.1cm depth; 0.628cm^2 area and 0.063cm^3 volume. The wound is limited to skin breakdown. There is no tunneling noted. There is a medium amount of serosanguineous drainage noted. The wound margin is distinct with the outline attached to the wound base. There is small (1-33%) pink, pale granulation within the wound bed. There is a large (67-100%) amount of necrotic tissue within the wound bed including Adherent Slough. The periwound skin appearance exhibited: Localized Edema, Moist. The periwound skin appearance did not exhibit: Callus, Crepitus, Excoriation, Fluctuance, Friable, Induration, Rash, Scarring, Dry/Scaly, Maceration, Atrophie Blanche, Cyanosis, Ecchymosis, Hemosiderin Staining, Mottled, Pallor, Rubor, Erythema. Periwound temperature was noted as No Abnormality. The periwound has tenderness on palpation. Wound #5 status is Open. Original cause of wound was Gradually Appeared. The wound is located on the Right,Posterior Calcaneus. The wound measures 2.7cm length x 2cm width x 0.2cm depth; 4.241cm^2 area and 0.848cm^3 volume. The wound is limited to skin breakdown. There is no tunneling or undermining noted. There is a large amount of serosanguineous drainage noted. The wound margin is distinct with the outline  attached to the wound base. There is medium (34-66%) pink, pale granulation within the wound bed. There is a medium (34-66%) amount of necrotic tissue within the wound bed including Adherent Slough. The periwound skin appearance exhibited: Localized Edema, Maceration, Moist, Mottled. The periwound skin appearance did not exhibit: Callus, Crepitus, Excoriation, Fluctuance, Friable, Induration, Rash, Scarring, Dry/Scaly, Atrophie Blanche, Cyanosis, Ecchymosis, Hemosiderin Staining, Pallor, Rubor, Erythema. Periwound temperature was noted as No Abnormality. The periwound has tenderness on palpation. Assessment Active Problems ICD-10 E11.621 - Type 2 diabetes mellitus with foot ulcer L89.613 - Pressure ulcer of right heel, stage 3 L97.212 - Non-pressure chronic ulcer of right calf with fat layer exposed L97.211 - Non-pressure chronic ulcer of right calf limited to breakdown of skin Z79.01 - Long term (current) use of anticoagulants Plan Wound Cleansing: Wound #4 Right,Medial Lower Leg: [name], [name] ([id number]) Clean wound with Normal Saline. - in clinic Cleanse wound with mild soap and water Wound #5 Right,Posterior Calcaneus: Clean wound with Normal Saline. - in clinic Cleanse wound with mild soap and water Anesthetic: Wound #4 Right,Medial Lower Leg: Topical Lidocaine 4% cream  applied to wound bed prior to debridement Wound #5 Right,Posterior Calcaneus: Topical Lidocaine 4% cream applied to wound bed prior to debridement Primary Wound Dressing: Wound #4 Right,Medial Lower Leg: Prisma Ag Wound #5 Right,Posterior Calcaneus: Santyl Ointment Prisma Ag Secondary Dressing: Wound #4 Right,Medial Lower Leg: Gauze and Kerlix/Conform Wound #5 Right,Posterior Calcaneus: Dry Gauze Foam - foam heel cup Dressing Change Frequency: Wound #4 Right,Medial Lower Leg: Change dressing every day. Wound #5 Right,Posterior Calcaneus: Change dressing every day. Follow-up Appointments: Return  Appointment in 1 week. Off-Loading: Other: - float heels with pillow under calves when in bed; wear pressure relieving boots while in bed I have recommended: 1. Santyl ointment to the wounds on the right heel. 2. Prisma AG on the right lower extremity to be covered with a bordered foam. 3. Good control of her diabetes 4. Adequate nutrition with protein supplements, vitamin A, vitamin C and zinc 5. Regular visits to the wound center The patient and her husband have had all questions answered and will be compliant [name], [name] ([id number]) Electronic Signature(s) Signed: 01/06/2016 2:53:21 PM By: [name], FACS Entered By: [name] on 01/06/2016 14:53:21 [name] ([id number]) -------------------------------------------------------------------------------- SuperBill Details Patient Name: [name] Date of Service: 01/06/2016 Medical Record Patient Account Number: [id number] Number: Afful, RN, BSN, Treating RN: 06/29/1935 (80 y.o. [name] Date of Birth/Sex: Female) Other Clinician: Primary Care Physician: Halina Maidens Treating [name] Referring Physician: Halina Maidens Physician/Extender: Suella Grove in Treatment: 5 Diagnosis Coding ICD-10 Codes Code Description E11.621 Type 2 diabetes mellitus with foot ulcer L89.613 Pressure ulcer of right heel, stage 3 L97.212 Non-pressure chronic ulcer of right calf with fat layer exposed L97.211 Non-pressure chronic ulcer of right calf limited to breakdown of skin Z79.01 Long term (current) use of anticoagulants Facility Procedures CPT4 Code: [id number] Description: 99213 - WOUND CARE VISIT-LEV 3 EST PT Modifier: Quantity: 1 Physician Procedures CPT4 Code Description: [id number] - WC PHYS LEVEL 3 - EST PT ICD-10 Description Diagnosis E11.621 Type 2 diabetes mellitus with foot ulcer L89.613 Pressure ulcer of right heel, stage 3 L97.212 Non-pressure chronic ulcer of right calf with fat L97.211   Non-pressure chronic ulcer of right calf limited t Modifier: layer exposed o breakdown of sk Quantity: 1 in Electronic Signature(s) Signed: 01/06/2016 4:14:07 PM By: Regan Lemming BSN, RN Signed: 01/06/2016 4:15:38 PM By: [name], FACS Previous Signature: 01/06/2016 2:53:34 PM Version By: [name], FACS Entered By: Regan Lemming on 01/06/2016 16:14:07

## 2016-01-07 NOTE — Progress Notes (Signed)
Madison, [name] ([id number]) Visit Report for 01/06/2016 Arrival Information Details Patient Name: [name], [name]. Date of Service: 01/06/2016 2:15 PM Medical Record Number: [id number] Patient Account Number: [id number] Date of Birth/Sex: Nov 17, 1935 (80 y.o. Female) Treating RN: Baruch Gouty, RN, BSN, [name] Primary Care Physician: Halina Maidens Other Clinician: Referring Physician: Halina Maidens Treating Physician/Extender: Frann Rider in Treatment: 5 Visit Information History Since Last Visit All ordered tests and consults were completed: No Patient Arrived: Wheel Chair Added or deleted any medications: No Arrival Time: 14:25 Any new allergies or adverse reactions: No Accompanied By: caregiver Had a fall or experienced change in No Transfer Assistance: None activities of daily living that may affect Patient Identification Verified: Yes risk of falls: Secondary Verification Process Yes Signs or symptoms of abuse/neglect since last No Completed: visito Patient Requires Transmission- No Hospitalized since last visit: No Based Precautions: Has Dressing in Place as Prescribed: Yes Patient Has Alerts: Yes Pain Present Now: No Patient Alerts: Patient on Blood Thinner Electronic Signature(s) Signed: 01/06/2016 4:44:30 PM By: Regan Lemming BSN, RN Entered By: Regan Lemming on 01/06/2016 14:25:41 [name] ([id number]) -------------------------------------------------------------------------------- Clinic Level of Care Assessment Details Patient Name: [name] Date of Service: 01/06/2016 2:15 PM Medical Record Number: [id number] Patient Account Number: [id number] Date of Birth/Sex: 04-12-1936 (80 y.o. Female) Treating RN: Afful, RN, BSN, [place] Primary Care Physician: Halina Maidens Other Clinician: Referring Physician: Halina Maidens Treating Physician/Extender: Frann Rider in Treatment: 5 Clinic Level of Care Assessment Items TOOL 4 Quantity  Score []  - Use when only an EandM is performed on FOLLOW-UP visit 0 ASSESSMENTS - Nursing Assessment / Reassessment X - Reassessment of Co-morbidities (includes updates in patient status) 1 10 X - Reassessment of Adherence to Treatment Plan 1 5 ASSESSMENTS - Wound and Skin Assessment / Reassessment []  - Simple Wound Assessment / Reassessment - one wound 0 X - Complex Wound Assessment / Reassessment - multiple wounds 2 5 []  - Dermatologic / Skin Assessment (not related to wound area) 0 ASSESSMENTS - Focused Assessment []  - Circumferential Edema Measurements - multi extremities 0 []  - Nutritional Assessment / Counseling / Intervention 0 X - Lower Extremity Assessment (monofilament, tuning fork, pulses) 1 5 []  - Peripheral Arterial Disease Assessment (using hand held doppler) 0 ASSESSMENTS - Ostomy and/or Continence Assessment and Care []  - Incontinence Assessment and Management 0 []  - Ostomy Care Assessment and Management (repouching, etc.) 0 PROCESS - Coordination of Care X - Simple Patient / Family Education for ongoing care 1 15 []  - Complex (extensive) Patient / Family Education for ongoing care 0 []  - Staff obtains Programmer, systems, Records, Test Results / Process Orders 0 []  - Staff telephones HHA, Nursing Homes / Clarify orders / etc 0 []  - Routine Transfer to another Facility (non-emergent condition) 0 MELESSIA, CAPELLA ([id number]) []  - Routine Hospital Admission (non-emergent condition) 0 []  - New Admissions / Biomedical engineer / Ordering NPWT, Apligraf, etc. 0 []  - Emergency Hospital Admission (emergent condition) 0 []  - Simple Discharge Coordination 0 []  - Complex (extensive) Discharge Coordination 0 PROCESS - Special Needs []  - Pediatric / Minor Patient Management 0 []  - Isolation Patient Management 0 []  - Hearing / Language / Visual special needs 0 []  - Assessment of Community assistance (transportation, D/C planning, etc.) 0 []  - Additional assistance / Altered mentation  0 []  - Support Surface(s) Assessment (bed, cushion, seat, etc.) 0 INTERVENTIONS - Wound Cleansing / Measurement []  - Simple Wound Cleansing - one wound 0 X - Complex  Wound Cleansing - multiple wounds 2 5 X - Wound Imaging (photographs - any number of wounds) 1 5 []  - Wound Tracing (instead of photographs) 0 []  - Simple Wound Measurement - one wound 0 X - Complex Wound Measurement - multiple wounds 2 5 INTERVENTIONS - Wound Dressings X - Small Wound Dressing one or multiple wounds 2 10 []  - Medium Wound Dressing one or multiple wounds 0 []  - Large Wound Dressing one or multiple wounds 0 []  - Application of Medications - topical 0 []  - Application of Medications - injection 0 INTERVENTIONS - Miscellaneous []  - External ear exam 0 BRITAINY, NEMEROFF ([id number]) []  - Specimen Collection (cultures, biopsies, blood, body fluids, etc.) 0 []  - Specimen(s) / Culture(s) sent or taken to Lab for analysis 0 []  - Patient Transfer (multiple staff / Harrel Lemon Lift / Similar devices) 0 []  - Simple Staple / Suture removal (25 or less) 0 []  - Complex Staple / Suture removal (26 or more) 0 []  - Hypo / Hyperglycemic Management (close monitor of Blood Glucose) 0 []  - Ankle / Brachial Index (ABI) - do not check if billed separately 0 X - Vital Signs 1 5 Has the patient been seen at the hospital within the last three years: Yes Total Score: 95 Level Of Care: New/Established - Level 3 Electronic Signature(s) Signed: 01/06/2016 4:44:30 PM By: Regan Lemming BSN, RN Entered By: Regan Lemming on 01/06/2016 16:13:39 [name] ([id number]) -------------------------------------------------------------------------------- Encounter Discharge Information Details Patient Name: [name] Date of Service: 01/06/2016 2:15 PM Medical Record Number: [id number] Patient Account Number: [id number] Date of Birth/Sex: [birth year] (80 y.o. Female) Treating RN: Baruch Gouty, RN, BSN, [name] Primary Care Physician: Halina Maidens Other Clinician: Referring Physician: Halina Maidens Treating Physician/Extender: Frann Rider in Treatment: 5 Encounter Discharge Information Items Discharge Pain Level: 0 Discharge Condition: Stable Ambulatory Status: Wheelchair Discharge Destination: Home Transportation: Private Auto Accompanied By: cargiver/hubby Schedule Follow-up Appointment: No Medication Reconciliation completed and provided to No Patient/Care Kieon Lawhorn: Provided on Clinical Summary of Care: 01/06/2016 Form Type Recipient Paper Patient LK Electronic Signature(s) Signed: 01/06/2016 2:59:49 PM By: Ruthine Dose Entered By: Ruthine Dose on 01/06/2016 14:59:49 [name] ([id number]) -------------------------------------------------------------------------------- Lower Extremity Assessment Details Patient Name: [name] Date of Service: 01/06/2016 2:15 PM Medical Record Number: [id number] Patient Account Number: [id number] Date of Birth/Sex: 05-05-1936 (80 y.o. Female) Treating RN: Baruch Gouty, RN, BSN, [name] Primary Care Physician: Halina Maidens Other Clinician: Referring Physician: Halina Maidens Treating Physician/Extender: Frann Rider in Treatment: 5 Vascular Assessment Pulses: Posterior Tibial Dorsalis Pedis Palpable: [Right:Yes] Extremity colors, hair growth, and conditions: Extremity Color: [Right:Normal] Hair Growth on Extremity: [Right:No] Temperature of Extremity: [Right:Warm] Capillary Refill: [Right:< 3 seconds] Toe Nail Assessment Left: Right: Thick: No Discolored: No Deformed: No Improper Length and Hygiene: No Electronic Signature(s) Signed: 01/06/2016 4:44:30 PM By: Regan Lemming BSN, RN Entered By: Regan Lemming on 01/06/2016 14:26:15 [name] ([id number]) -------------------------------------------------------------------------------- Multi Wound Chart Details Patient Name: [name] Date of Service: 01/06/2016 2:15 PM Medical  Record Number: [id number] Patient Account Number: [id number] Date of Birth/Sex: Jan 22, 1936 (80 y.o. 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] [5:the Lower Extremity] [N/A:N/A] Comorbid History: [4:Chronic Obstructive Pulmonary Disease (COPD), Angina, Congestive Heart Failure, Congestive Heart Failure, Type II Diabetes, Lupus Type II Diabetes, Lupus Erythematosus, Osteoarthritis, Dementia Osteoarthritis, Dementia] [5:Chronic  Obstructive Pulmonary Disease (COPD), Angina, Erythematosus,] [N/A:N/A] Date Acquired: [4:11/03/2015] [5:11/03/2015] [N/A:N/A] Weeks of Treatment: [4:5] [5:5] [N/A:N/A] Wound Status: [4:Open] [5:Open] [N/A:N/A] Measurements L x W x D 0.8x1x0.1 [5:2.7x2x0.2] [N/A:N/A] (cm) Area (cm) : [4:0.628] [5:4.241] [N/A:N/A] Volume (cm) : [4:0.063] [5:0.848] [N/A:N/A] % Reduction in Area: [4:74.40%] [5:55.60%] [N/A:N/A] % Reduction in Volume: 74.30% [5:11.20%] [N/A:N/A] Classification: [4:Grade 1] [5:Grade 1] [N/A:N/A] Exudate Amount: [4:Medium] [5:Large] [N/A:N/A] Exudate Type: [4:Serosanguineous] [5:Serosanguineous] [N/A:N/A] Exudate Color: [4:red, brown] [5:red, brown] [N/A:N/A] Foul Odor After [4:No] [5:Yes] [N/A:N/A] Cleansing: Odor Anticipated Due to N/A [5:No] [N/A:N/A] Product Use: YURIDIANA, [place]TON ([id number]) Wound Margin: Distinct, outline  attached Distinct, outline attached N/A Granulation Amount: Small (1-33%) Medium (34-66%) N/A Granulation Quality: Pink, Pale Pink, Pale N/A Necrotic Amount: Large (67-100%) Medium (34-66%) N/A Exposed Structures: Fascia: No Fascia: No N/A Fat: No Fat: No Tendon: No Tendon: No Muscle: No Muscle: No Joint: No Joint: No Bone: No Bone: No Limited to Skin Limited to Skin Breakdown Breakdown Epithelialization: None Small (1-33%) N/A Periwound Skin Texture: Edema: Yes Edema: Yes N/A Excoriation: No Excoriation: No Induration: No Induration: No Callus: No Callus: No Crepitus: No Crepitus: No Fluctuance: No Fluctuance: No Friable: No Friable: No Rash: No Rash: No Scarring: No Scarring: No Periwound Skin Moist: Yes Maceration: Yes N/A Moisture: Maceration: No Moist: Yes Dry/Scaly: No Dry/Scaly: No Periwound Skin Color: Atrophie Blanche: No Mottled: Yes N/A Cyanosis: No Atrophie Blanche: No Ecchymosis: No Cyanosis: No Erythema: No Ecchymosis: No Hemosiderin Staining: No Erythema: No Mottled: No Hemosiderin Staining: No Pallor: No Pallor: No Rubor: No Rubor: No Temperature: No Abnormality No Abnormality N/A Tenderness on Yes Yes N/A Palpation: Wound Preparation: Ulcer Cleansing: Ulcer Cleansing: N/A Rinsed/Irrigated with Rinsed/Irrigated with Saline Saline Topical Anesthetic Topical Anesthetic Applied: Other: lidocaine Applied: Other: lidocaine 4% 4% Treatment Notes Electronic Signature(s) Signed: 01/06/2016 4:44:30 PM By: Regan Lemming BSN, RN Entered By: Regan Lemming on 01/06/2016 14:39:57 [name] ([id number]) [name] ([id number]) -------------------------------------------------------------------------------- [place]isp Details Patient Name: HARSHIKA, GROSSHANS. Date of Service: 01/06/2016 2:15 PM Medical Record Number: [id number] Patient Account Number: [id number] Date of Birth/Sex: [birth year] (80 y.o.  Female) Treating RN: Baruch Gouty, RN, BSN, [name] Primary Care Physician: Halina Maidens Other Clinician: Referring Physician: Halina Maidens Treating Physician/Extender: Frann Rider in Treatment: 5 Active Inactive Abuse / Safety / Falls / Self Care Management Nursing Diagnoses: Potential for falls Goals: Patient/caregiver will verbalize understanding of skin care regimen Date Initiated: 12/02/2015 Goal Status: Active Patient/caregiver will verbalize/demonstrate measures taken to prevent injury and/or falls Date Initiated: 12/02/2015 Goal Status: Active Interventions: Assess fall risk on admission and as needed Assess: immobility, friction, shearing, incontinence upon admission and as needed Assess impairment of mobility on admission and as needed per policy Assess self care needs on admission and as needed Provide education on fall prevention Provide education on safe transfers Notes: Wound/Skin Impairment Nursing Diagnoses: Impaired tissue integrity Goals: Patient/caregiver will verbalize understanding of skin care regimen Date Initiated: 12/02/2015 Goal Status: Active Interventions: Assess patient/caregiver ability to obtain necessary supplies Assess patient/caregiver ability to perform ulcer/skin care regimen upon admission and as needed ATLEE, MCADOO ([id number]) Assess ulceration(s) every visit Provide education on ulcer and skin care Notes: Electronic Signature(s) Signed: 01/06/2016 4:44:30  PM By: Regan Lemming BSN, RN Entered By: Regan Lemming on 01/06/2016 14:39:48 [name] ([id number]) -------------------------------------------------------------------------------- Pain Assessment Details Patient Name: [name] Date of Service: 01/06/2016 2:15 PM Medical Record Number: [id number] Patient Account Number: [id number] Date of Birth/Sex: 1935/07/25 (80 y.o. Female) Treating RN: Baruch Gouty, RN, BSN, [name] Primary Care Physician: Halina Maidens Other  Clinician: Referring Physician: Halina Maidens Treating Physician/Extender: Frann Rider in Treatment: 5 Active Problems Location of Pain Severity and Description of Pain Patient Has Paino No Site Locations With Dressing Change: No Pain Management and Medication Current Pain Management: Electronic Signature(s) Signed: 01/06/2016 4:44:30 PM By: Regan Lemming BSN, RN Entered By: Regan Lemming on 01/06/2016 14:25:49 [name] ([id number]) -------------------------------------------------------------------------------- Patient/Caregiver Education Details Patient Name: [name] Date of Service: 01/06/2016 2:15 PM Medical Record Number: [id number] Patient Account Number: [id number] Date of Birth/Gender: 29-May-1936 (80 y.o. Female) Treating RN: Baruch Gouty, RN, BSN, [name] Primary Care Physician: Halina Maidens Other Clinician: Referring Physician: Halina Maidens Treating Physician/Extender: Frann Rider in Treatment: 5 Education Assessment Education Provided To: Patient Education Topics Provided Safety: Methods: Explain/Verbal Responses: State content correctly Wound/Skin Impairment: Methods: Explain/Verbal Responses: State content correctly Electronic Signature(s) Signed: 01/06/2016 4:44:30 PM By: Regan Lemming BSN, RN Entered By: Regan Lemming on 01/06/2016 14:59:19 [name] ([id number]) -------------------------------------------------------------------------------- Wound Assessment Details Patient Name: [name] Date of Service: 01/06/2016 2:15 PM Medical Record Number: [id number] Patient Account Number: [id number] Date of Birth/Sex: 1935-08-12 (80 y.o. Female) Treating RN: Afful, RN, BSN, [place] Primary Care Physician: Halina Maidens Other Clinician: Referring Physician: Halina Maidens Treating Physician/Extender: Frann Rider in Treatment: 5 Wound Status Wound Number: 4 Primary Diabetic Wound/Ulcer of the Lower Etiology:  Extremity Wound Location: Right Lower Leg - Medial Wound Open Wounding Event: Gradually Appeared Status: Date Acquired: 11/03/2015 Comorbid Chronic Obstructive Pulmonary Disease Weeks Of Treatment: 5 History: (COPD), Angina, Congestive Heart Clustered Wound: No Failure, Type II Diabetes, Lupus Erythematosus, Osteoarthritis, Dementia Photos Photo Uploaded By: Regan Lemming on 01/06/2016 16:19:03 Wound Measurements Length: (cm) 0.8 Width: (cm) 1 Depth: (cm) 0.1 Area: (cm) 0.628 Volume: (cm) 0.063 % Reduction in Area: 74.4% % Reduction in Volume: 74.3% Epithelialization: None Tunneling: No Wound Description Classification: Grade 1 Wound Margin: Distinct, outline attached Exudate Amount: Medium YASURI, POMERENKE ([id number]) Foul Odor After Cleansing: No Exudate Type: Serosanguineous Exudate Color: red, brown Wound Bed Granulation Amount: Small (1-33%) Exposed Structure Granulation Quality: Pink, Pale Fascia Exposed: No Necrotic Amount: Large (67-100%) Fat Layer Exposed: No Necrotic Quality: Adherent Slough Tendon Exposed: No Muscle Exposed: No Joint Exposed: No Bone Exposed: No Limited to Skin Breakdown Periwound Skin Texture Texture Color No Abnormalities Noted: No No Abnormalities Noted: No Callus: No Atrophie Blanche: No Crepitus: No Cyanosis: No Excoriation: No Ecchymosis: No Fluctuance: No Erythema: No Friable: No Hemosiderin Staining: No Induration: No Mottled: No Localized Edema: Yes Pallor: No Rash: No Rubor: No Scarring: No Temperature / Pain Moisture Temperature: No Abnormality No Abnormalities Noted: No Tenderness on Palpation: Yes Dry / Scaly: No Maceration: No Moist: Yes Wound Preparation Ulcer Cleansing: Rinsed/Irrigated with Saline Topical Anesthetic Applied: Other: lidocaine 4%, Treatment Notes Wound #4 (Right, Medial Lower Leg) 1. Cleansed with: Clean wound with Normal Saline 4. Dressing Applied: Prisma Ag 5. Secondary  Dressing Applied Bordered Foam Dressing Electronic Signature(s) Signed: 01/06/2016 4:44:30 PM By: Regan Lemming BSN, RN [name] ([id number]) Entered By: Regan Lemming on 01/06/2016 14:39:12 [name] ([id number]) -------------------------------------------------------------------------------- Wound Assessment Details Patient Name: [name] Date of Service: 01/06/2016  2:15 PM Medical Record Number: [id number] Patient Account Number: [id number] Date of Birth/Sex: Jul 13, 1935 (79 y.o. Female) Treating RN: Afful, RN, BSN, [place] Primary Care Physician: Halina Maidens Other Clinician: Referring Physician: Halina Maidens Treating Physician/Extender: Frann Rider in Treatment: 5 Wound Status Wound Number: 5 Primary Diabetic Wound/Ulcer of the Lower Etiology: Extremity Wound Location: Right Calcaneus - Posterior Wound Open Wounding Event: Gradually Appeared Status: Date Acquired: 11/03/2015 Comorbid Chronic Obstructive Pulmonary Disease Weeks Of Treatment: 5 History: (COPD), Angina, Congestive Heart Clustered Wound: No Failure, Type II Diabetes, Lupus Erythematosus, Osteoarthritis, Dementia Photos Photo Uploaded By: Regan Lemming on 01/06/2016 16:19:03 Wound Measurements Length: (cm) 2.7 Width: (cm) 2 Depth: (cm) 0.2 Area: (cm) 4.241 Volume: (cm) 0.848 % Reduction in Area: 55.6% % Reduction in Volume: 11.2% Epithelialization: Small (1-33%) Tunneling: No Undermining: No Wound Description Classification: Grade 1 Wound Margin: Distinct, outline attached Exudate Amount: Large Exudate Type: Serosanguineous Exudate Color: red, brown Foul Odor After Cleansing: Yes Due to Product Use: No Wound Bed Granulation Amount: Medium (34-66%) Exposed Structure Granulation Quality: Pink, Pale Fascia Exposed: No Necrotic Amount: Medium (34-66%) Fat Layer Exposed: No MARILIN, GALLETTI ([id number]) Necrotic Quality: Adherent Slough Tendon Exposed: No Muscle  Exposed: No Joint Exposed: No Bone Exposed: No Limited to Skin Breakdown Periwound Skin Texture Texture Color No Abnormalities Noted: No No Abnormalities Noted: No Callus: No Atrophie Blanche: No Crepitus: No Cyanosis: No Excoriation: No Ecchymosis: No Fluctuance: No Erythema: No Friable: No Hemosiderin Staining: No Induration: No Mottled: Yes Localized Edema: Yes Pallor: No Rash: No Rubor: No Scarring: No Temperature / Pain Moisture Temperature: No Abnormality No Abnormalities Noted: No Tenderness on Palpation: Yes Dry / Scaly: No Maceration: Yes Moist: Yes Wound Preparation Ulcer Cleansing: Rinsed/Irrigated with Saline Topical Anesthetic Applied: Other: lidocaine 4%, Treatment Notes Wound #5 (Right, Posterior Calcaneus) 1. Cleansed with: Clean wound with Normal Saline 4. Dressing Applied: Santyl Ointment 5. Secondary Dressing Applied Gauze and Kerlix/Conform 7. Secured with Recruitment consultant) Signed: 01/06/2016 4:44:30 PM By: Regan Lemming BSN, RN Entered By: Regan Lemming on 01/06/2016 14:39:40 [name] ([id number]) -------------------------------------------------------------------------------- Vitals Details Patient Name: [name] Date of Service: 01/06/2016 2:15 PM Medical Record Number: [id number] Patient Account Number: [id number] Date of Birth/Sex: 23-Dec-1935 (80 y.o. Female) Treating RN: Afful, RN, BSN, [place] Primary Care Physician: Halina Maidens Other Clinician: Referring Physician: Halina Maidens Treating Physician/Extender: Frann Rider in Treatment: 5 Vital Signs Time Taken: 14:29 Temperature (F): 98.4 Height (in): 64 Pulse (bpm): 61 Weight (lbs): 110 Respiratory Rate (breaths/min): 16 Body Mass Index (BMI): 18.9 Blood Pressure (mmHg): 131/44 Reference Range: 80 - 120 mg / dl Electronic Signature(s) Signed: 01/06/2016 4:44:30 PM By: Regan Lemming BSN, RN Entered By: Regan Lemming on 01/06/2016 14:29:20

## 2016-01-10 DIAGNOSIS — L89890 Pressure ulcer of other site, unstageable: Secondary | ICD-10-CM | POA: Diagnosis not present

## 2016-01-10 DIAGNOSIS — L89610 Pressure ulcer of right heel, unstageable: Secondary | ICD-10-CM | POA: Diagnosis not present

## 2016-01-10 DIAGNOSIS — L89620 Pressure ulcer of left heel, unstageable: Secondary | ICD-10-CM | POA: Diagnosis not present

## 2016-01-10 DIAGNOSIS — I5032 Chronic diastolic (congestive) heart failure: Secondary | ICD-10-CM | POA: Diagnosis not present

## 2016-01-10 DIAGNOSIS — S72001D Fracture of unspecified part of neck of right femur, subsequent encounter for closed fracture with routine healing: Secondary | ICD-10-CM | POA: Diagnosis not present

## 2016-01-10 DIAGNOSIS — F0390 Unspecified dementia without behavioral disturbance: Secondary | ICD-10-CM | POA: Diagnosis not present

## 2016-01-11 DIAGNOSIS — L89620 Pressure ulcer of left heel, unstageable: Secondary | ICD-10-CM | POA: Diagnosis not present

## 2016-01-11 DIAGNOSIS — I5032 Chronic diastolic (congestive) heart failure: Secondary | ICD-10-CM | POA: Diagnosis not present

## 2016-01-11 DIAGNOSIS — S72001D Fracture of unspecified part of neck of right femur, subsequent encounter for closed fracture with routine healing: Secondary | ICD-10-CM | POA: Diagnosis not present

## 2016-01-11 DIAGNOSIS — F0390 Unspecified dementia without behavioral disturbance: Secondary | ICD-10-CM | POA: Diagnosis not present

## 2016-01-11 DIAGNOSIS — L89610 Pressure ulcer of right heel, unstageable: Secondary | ICD-10-CM | POA: Diagnosis not present

## 2016-01-11 DIAGNOSIS — L89890 Pressure ulcer of other site, unstageable: Secondary | ICD-10-CM | POA: Diagnosis not present

## 2016-01-12 DIAGNOSIS — F0390 Unspecified dementia without behavioral disturbance: Secondary | ICD-10-CM | POA: Diagnosis not present

## 2016-01-12 DIAGNOSIS — L89620 Pressure ulcer of left heel, unstageable: Secondary | ICD-10-CM | POA: Diagnosis not present

## 2016-01-12 DIAGNOSIS — L89890 Pressure ulcer of other site, unstageable: Secondary | ICD-10-CM | POA: Diagnosis not present

## 2016-01-12 DIAGNOSIS — L89610 Pressure ulcer of right heel, unstageable: Secondary | ICD-10-CM | POA: Diagnosis not present

## 2016-01-12 DIAGNOSIS — I5032 Chronic diastolic (congestive) heart failure: Secondary | ICD-10-CM | POA: Diagnosis not present

## 2016-01-12 DIAGNOSIS — S72001D Fracture of unspecified part of neck of right femur, subsequent encounter for closed fracture with routine healing: Secondary | ICD-10-CM | POA: Diagnosis not present

## 2016-01-13 ENCOUNTER — Encounter: Payer: Medicare Other | Attending: Surgery | Admitting: Surgery

## 2016-01-13 DIAGNOSIS — Z7901 Long term (current) use of anticoagulants: Secondary | ICD-10-CM | POA: Diagnosis not present

## 2016-01-13 DIAGNOSIS — R339 Retention of urine, unspecified: Secondary | ICD-10-CM | POA: Diagnosis not present

## 2016-01-13 DIAGNOSIS — I480 Paroxysmal atrial fibrillation: Secondary | ICD-10-CM | POA: Diagnosis not present

## 2016-01-13 DIAGNOSIS — E11622 Type 2 diabetes mellitus with other skin ulcer: Secondary | ICD-10-CM | POA: Diagnosis not present

## 2016-01-13 DIAGNOSIS — L97211 Non-pressure chronic ulcer of right calf limited to breakdown of skin: Secondary | ICD-10-CM | POA: Diagnosis not present

## 2016-01-13 DIAGNOSIS — N189 Chronic kidney disease, unspecified: Secondary | ICD-10-CM | POA: Diagnosis not present

## 2016-01-13 DIAGNOSIS — L89613 Pressure ulcer of right heel, stage 3: Secondary | ICD-10-CM | POA: Insufficient documentation

## 2016-01-13 DIAGNOSIS — I509 Heart failure, unspecified: Secondary | ICD-10-CM | POA: Diagnosis not present

## 2016-01-13 DIAGNOSIS — L97411 Non-pressure chronic ulcer of right heel and midfoot limited to breakdown of skin: Secondary | ICD-10-CM | POA: Diagnosis not present

## 2016-01-13 DIAGNOSIS — Z87891 Personal history of nicotine dependence: Secondary | ICD-10-CM | POA: Diagnosis not present

## 2016-01-13 DIAGNOSIS — F419 Anxiety disorder, unspecified: Secondary | ICD-10-CM | POA: Insufficient documentation

## 2016-01-13 DIAGNOSIS — E11621 Type 2 diabetes mellitus with foot ulcer: Secondary | ICD-10-CM | POA: Insufficient documentation

## 2016-01-13 DIAGNOSIS — L97212 Non-pressure chronic ulcer of right calf with fat layer exposed: Secondary | ICD-10-CM | POA: Insufficient documentation

## 2016-01-13 DIAGNOSIS — L97811 Non-pressure chronic ulcer of other part of right lower leg limited to breakdown of skin: Secondary | ICD-10-CM | POA: Diagnosis not present

## 2016-01-13 DIAGNOSIS — M329 Systemic lupus erythematosus, unspecified: Secondary | ICD-10-CM | POA: Insufficient documentation

## 2016-01-13 DIAGNOSIS — E1122 Type 2 diabetes mellitus with diabetic chronic kidney disease: Secondary | ICD-10-CM | POA: Diagnosis not present

## 2016-01-13 DIAGNOSIS — J449 Chronic obstructive pulmonary disease, unspecified: Secondary | ICD-10-CM | POA: Diagnosis not present

## 2016-01-13 DIAGNOSIS — F0390 Unspecified dementia without behavioral disturbance: Secondary | ICD-10-CM | POA: Diagnosis not present

## 2016-01-14 DIAGNOSIS — F0390 Unspecified dementia without behavioral disturbance: Secondary | ICD-10-CM | POA: Diagnosis not present

## 2016-01-14 DIAGNOSIS — L89890 Pressure ulcer of other site, unstageable: Secondary | ICD-10-CM | POA: Diagnosis not present

## 2016-01-14 DIAGNOSIS — S72001D Fracture of unspecified part of neck of right femur, subsequent encounter for closed fracture with routine healing: Secondary | ICD-10-CM | POA: Diagnosis not present

## 2016-01-14 DIAGNOSIS — I5032 Chronic diastolic (congestive) heart failure: Secondary | ICD-10-CM | POA: Diagnosis not present

## 2016-01-14 DIAGNOSIS — L89610 Pressure ulcer of right heel, unstageable: Secondary | ICD-10-CM | POA: Diagnosis not present

## 2016-01-14 DIAGNOSIS — L89620 Pressure ulcer of left heel, unstageable: Secondary | ICD-10-CM | POA: Diagnosis not present

## 2016-01-14 NOTE — Progress Notes (Signed)
[name], [name] ([id number]) Visit Report for 01/13/2016 Chief Complaint Document Details Patient Name: [name], [name]. Date of Service: 01/13/2016 3:00 PM Medical Record Number: [id number] Patient Account Number: [id number] Date of Birth/Sex: Jun 27, 1935 (79 y.o. Female) Treating RN: [name] Primary Care Physician: Halina Maidens Other Clinician: Referring Physician: Halina Maidens Treating Physician/Extender: Frann Rider in Treatment: 6 Information Obtained from: Patient Chief Complaint Patient is at the clinic for treatment of an open pressure ulcer to her right calcaneum which has been there for about 4 weeks Electronic Signature(s) Signed: 01/13/2016 3:37:45 PM By: [name], FACS Entered By: [name] on 01/13/2016 15:37:45 [name] ([id number]) -------------------------------------------------------------------------------- Debridement Details Patient Name: [name]. Date of Service: 01/13/2016 3:00 PM Medical Record Number: [id number] Patient Account Number: [id number] Date of Birth/Sex: 09/19/1935 (79 y.o. Female) Treating RN: [name] Primary Care Physician: Halina Maidens Other Clinician: Referring Physician: Halina Maidens Treating Physician/Extender: Frann Rider in Treatment: 6 Debridement Performed for Wound #5 Right,Posterior Calcaneus Assessment: Performed By: Physician [name], MD Debridement: Debridement Pre-procedure Yes Verification/Time Out Taken: Start Time: 15:30 Pain Control: Lidocaine 4% Topical Solution Level: Skin/Subcutaneous Tissue Total Area Debrided (L x 2.3 (cm) x 1.9 (cm) = 4.37 (cm) W): Tissue and other Non-Viable, Fibrin/Slough, Subcutaneous material debrided: Instrument: Curette Bleeding: Minimum Hemostasis Achieved: Pressure End Time: 15:35 Procedural Pain: 0 Post Procedural Pain: 0 Response to Treatment: Procedure was tolerated well Post Debridement Measurements of  Total Wound Length: (cm) 2.3 Width: (cm) 1.9 Depth: (cm) 0.2 Volume: (cm) 0.686 Post Procedure Diagnosis Same as Pre-procedure Electronic Signature(s) Signed: 01/13/2016 3:37:38 PM By: [name], FACS Signed: 01/13/2016 4:49:27 PM By: [name] Entered By: [name] on 01/13/2016 15:37:38 [name] ([id number]) -------------------------------------------------------------------------------- HPI Details Patient Name: [name] Date of Service: 01/13/2016 3:00 PM Medical Record Number: [id number] Patient Account Number: [id number] Date of Birth/Sex: January 31, 1936 (79 y.o. Female) Treating RN: [name] Primary Care Physician: Halina Maidens Other Clinician: Referring Physician: Halina Maidens Treating Physician/Extender: Frann Rider in Treatment: 6 History of Present Illness Location: right posterior heel, and both lower extremities Quality: Patient reports experiencing a sharp pain to affected area(s). Severity: Patient states wound (s) are getting better. Duration: Patient has had the wound for < 4 weeks prior to presenting for treatment Timing: Pain in wound is Intermittent (comes and goes Context: The wound appeared gradually over time while she was in hospital with a right hip fracture Modifying Factors: Consults to this date include:was seen by her PCP and put on some antibiotics a while ago Associated Signs and Symptoms: Patient reports having difficulty standing for long periods. HPI Description: 80 year old patient referred who was recently discharged in April of this year with a left heel ulcer now comes with a right heel ulcer and some ulcerations in both lower extremities which have all come along during her recent hospitalization which she had a right hip fracture. She has a past medical history of COPD, diabetes mellitus type 2 with chronic kidney disease, multi-infarct dementia, anxiety, urinary retention, paroxysmal atrial  fibrillation. She has also had moderate malnutrition, multi-infarct dementia, congestive heart failure, left displaced femoral neck fracture, systemic lupus erythematosus. She is also status total abdominal hysterectomy with bilateral salpingo-oophorectomy, EGDs, hip arthroplasty on the left, cataract surgery. She has been a from a smoker and quit in May 2013 and she smoked for about 40 years. recently admitted to [facility] between January 19 and 07/03/2015 12/09/2015 -- o right foot x-ray --  IMPRESSION:Soft tissue ulceration of the heel. No underlying erosive bony abnormality. Diffuse degenerative change. No acute bony abnormality. 12/30/2015-- admitted to the hospital between July 12 and July 18 of 2017 and was treated for cerebral infarction, infected decubitus ulcer and aspiration pneumonia of the right lung. MRI was negative for stroke and was thought to be secondary to pneumonia. She was treated with clindamycin. Patient was discharged home on Keflex and Zyvox. Dr. Lucky Cowboy took her for an aortogram and a right lower extremity runoff and found to 30% stenosis in the mid to distal SFA but no significant stenosis in the popliteal artery and the SFA. There was a two-vessel runoff through the anterior tibial artery and peroneal artery without significant stenosis. felt her perfusion was adequate for wound healing. Electronic Signature(s) Signed: 01/13/2016 3:37:49 PM By: [name], FACS Entered By: [name] on 01/13/2016 15:37:49 [name] ([id number]) -------------------------------------------------------------------------------- Physical Exam Details Patient Name: [name] Date of Service: 01/13/2016 3:00 PM Medical Record Number: [id number] Patient Account Number: [id number] Date of Birth/Sex: Jun 23, 1935 (79 y.o. Female) Treating RN: [name] Primary Care Physician: Halina Maidens Other Clinician: Referring Physician: Halina Maidens Treating Physician/Extender: Frann Rider in Treatment: 6 Constitutional . Pulse regular. Respirations normal and unlabored. Afebrile. . Eyes Nonicteric. Reactive to light. Ears, Nose, Mouth, and Throat Lips, teeth, and gums WNL.Marland Kitchen Moist mucosa without lesions. Neck supple and nontender. No palpable supraclavicular or cervical adenopathy. Normal sized without goiter. Respiratory WNL. No retractions.. Breath sounds WNL, No rubs, rales, rhonchi, or wheeze.. Cardiovascular Heart rhythm and rate regular, no murmur or gallop.. Pedal Pulses WNL. No clubbing, cyanosis or edema. Lymphatic No adneopathy. No adenopathy. No adenopathy. Musculoskeletal Adexa without tenderness or enlargement.. Digits and nails w/o clubbing, cyanosis, infection, petechiae, ischemia, or inflammatory conditions.. Integumentary (Hair, Skin) No suspicious lesions. No crepitus or fluctuance. No peri-wound warmth or erythema. No masses.Marland Kitchen Psychiatric Judgement and insight Intact.. No evidence of depression, anxiety, or agitation.. Notes the heel wound has slough which was sharply debrided with a curette and bleeding controlled with pressure the medial ankle wound is now clean and no sharp debridement was required today. Electronic Signature(s) Signed: 01/13/2016 3:38:21 PM By: [name], FACS Entered By: [name] on 01/13/2016 15:38:20 [name] ([id number]) -------------------------------------------------------------------------------- Physician Orders Details Patient Name: [name] Date of Service: 01/13/2016 3:00 PM Medical Record Patient Account Number: [id number] Number: Afful, RN, BSN, Treating RN: 1936/01/16 (80 y.o. [name] Date of Birth/Sex: Female) Other Clinician: Primary Care Physician: Halina Maidens Treating [name] Referring Physician: Halina Maidens Physician/Extender: Suella Grove in Treatment: 6 Verbal / Phone Orders: Yes Clinician: Afful, RN,  BSN, Rita Read Back and Verified: Yes Diagnosis Coding Wound Cleansing Wound #4 Right,Medial Lower Leg o Clean wound with Normal Saline. - in clinic o Cleanse wound with mild soap and water Wound #5 Right,Posterior Calcaneus o Clean wound with Normal Saline. - in clinic o Cleanse wound with mild soap and water Anesthetic Wound #4 Right,Medial Lower Leg o Topical Lidocaine 4% cream applied to wound bed prior to debridement Wound #5 Right,Posterior Calcaneus o Topical Lidocaine 4% cream applied to wound bed prior to debridement Primary Wound Dressing Wound #4 Right,Medial Lower Leg o Prisma Ag Wound #5 Right,Posterior Calcaneus o Santyl Ointment o Prisma Ag Secondary Dressing Wound #4 Right,Medial Lower Leg o Gauze and Kerlix/Conform Wound #5 Right,Posterior Calcaneus o Dry Gauze o Foam - foam heel cup Dressing Change Frequency Wound #4 Right,Medial Lower Leg RAEVAN, DELIO. ([id number]) o  Change dressing every day. Wound #5 Right,Posterior Calcaneus o Change dressing every day. Follow-up Appointments o Return Appointment in 1 week. Off-Loading o Other: - float heels with pillow under calves when in bed; wear pressure relieving boots while in bed Additional Orders / Instructions Wound #4 Right,Medial Lower Leg o Increase protein intake. o Activity as tolerated Wound #5 Right,Posterior Calcaneus o Increase protein intake. o Activity as tolerated Electronic Signature(s) Signed: 01/13/2016 4:00:53 PM By: Regan Lemming BSN, RN Signed: 01/13/2016 4:34:24 PM By: [name], FACS Entered By: Regan Lemming on 01/13/2016 15:35:15 [name] ([id number]) -------------------------------------------------------------------------------- Problem List Details Patient Name: [name], [name]. Date of Service: 01/13/2016 3:00 PM Medical Record Number: [id number] Patient Account Number: [id number] Date of Birth/Sex: 1936-03-15 (79 y.o.  Female) Treating RN: [name] Primary Care Physician: Halina Maidens Other Clinician: Referring Physician: Halina Maidens Treating Physician/Extender: Frann Rider in Treatment: 6 Active Problems ICD-10 Encounter Code Description Active Date Diagnosis E11.621 Type 2 diabetes mellitus with foot ulcer 12/02/2015 Yes L89.613 Pressure ulcer of right heel, stage 3 12/02/2015 Yes L97.212 Non-pressure chronic ulcer of right calf with fat layer 12/02/2015 Yes exposed L97.211 Non-pressure chronic ulcer of right calf limited to 12/02/2015 Yes breakdown of skin Z79.01 Long term (current) use of anticoagulants 12/02/2015 Yes Inactive Problems Resolved Problems Electronic Signature(s) Signed: 01/13/2016 3:37:09 PM By: [name], FACS Entered By: [name] on 01/13/2016 15:37:09 [name] ([id number]) -------------------------------------------------------------------------------- Progress Note Details Patient Name: [name] Date of Service: 01/13/2016 3:00 PM Medical Record Number: [id number] Patient Account Number: [id number] Date of Birth/Sex: [birth year] (79 y.o. Female) Treating RN: [name] Primary Care Physician: Halina Maidens Other Clinician: Referring Physician: Halina Maidens Treating Physician/Extender: Frann Rider in Treatment: 6 Subjective Chief Complaint Information obtained from Patient Patient is at the clinic for treatment of an open pressure ulcer to her right calcaneum which has been there for about 4 weeks History of Present Illness (HPI) The following HPI elements were documented for the patient's wound: Location: right posterior heel, and both lower extremities Quality: Patient reports experiencing a sharp pain to affected area(s). Severity: Patient states wound (s) are getting better. Duration: Patient has had the wound for < 4 weeks prior to presenting for treatment Timing: Pain in wound is Intermittent (comes  and goes Context: The wound appeared gradually over time while she was in hospital with a right hip fracture Modifying Factors: Consults to this date include:was seen by her PCP and put on some antibiotics a while ago Associated Signs and Symptoms: Patient reports having difficulty standing for long periods. 80 year old patient referred who was recently discharged in April of this year with a left heel ulcer now comes with a right heel ulcer and some ulcerations in both lower extremities which have all come along during her recent hospitalization which she had a right hip fracture. She has a past medical history of COPD, diabetes mellitus type 2 with chronic kidney disease, multi-infarct dementia, anxiety, urinary retention, paroxysmal atrial fibrillation. She has also had moderate malnutrition, multi-infarct dementia, congestive heart failure, left displaced femoral neck fracture, systemic lupus erythematosus. She is also status total abdominal hysterectomy with bilateral salpingo-oophorectomy, EGDs, hip arthroplasty on the left, cataract surgery. She has been a from a smoker and quit in May 2013 and she smoked for about 40 years. recently admitted to [facility] between January 19 and 07/03/2015 12/09/2015 -- right foot x-ray -- IMPRESSION:Soft tissue ulceration of the heel. No underlying erosive bony abnormality.  Diffuse degenerative change. No acute bony abnormality. 12/30/2015-- admitted to the hospital between July 12 and July 18 of 2017 and was treated for cerebral infarction, infected decubitus ulcer and aspiration pneumonia of the right lung. MRI was negative for stroke and was thought to be secondary to pneumonia. She was treated with clindamycin. Patient was discharged home on Keflex and Zyvox. Dr. Lucky Cowboy took her for an aortogram and a right lower extremity runoff and found to 30% stenosis in the mid to distal SFA but no significant stenosis in the popliteal artery  and the SFA. There was a two-vessel runoff [name], [name]. ([id number]) through the anterior tibial artery and peroneal artery without significant stenosis. felt her perfusion was adequate for wound healing. Objective Constitutional Pulse regular. Respirations normal and unlabored. Afebrile. Vitals Time Taken: 3:15 PM, Height: 64 in, Weight: 110 lbs, BMI: 18.9, Temperature: 98.3 F, Pulse: 65 bpm, Respiratory Rate: 16 breaths/min, Blood Pressure: 153/51 mmHg. Eyes Nonicteric. Reactive to light. Ears, Nose, Mouth, and Throat Lips, teeth, and gums WNL.Marland Kitchen Moist mucosa without lesions. Neck supple and nontender. No palpable supraclavicular or cervical adenopathy. Normal sized without goiter. Respiratory WNL. No retractions.. Breath sounds WNL, No rubs, rales, rhonchi, or wheeze.. Cardiovascular Heart rhythm and rate regular, no murmur or gallop.. Pedal Pulses WNL. No clubbing, cyanosis or edema. Lymphatic No adneopathy. No adenopathy. No adenopathy. Musculoskeletal Adexa without tenderness or enlargement.. Digits and nails w/o clubbing, cyanosis, infection, petechiae, ischemia, or inflammatory conditions.Marland Kitchen Psychiatric Judgement and insight Intact.. No evidence of depression, anxiety, or agitation.. General Notes: the heel wound has slough which was sharply debrided with a curette and bleeding controlled with pressure the medial ankle wound is now clean and no sharp debridement was required today. Integumentary (Hair, Skin) No suspicious lesions. No crepitus or fluctuance. No peri-wound warmth or erythema. No masses.Marland Kitchen [name], [name] ([id number]) Wound #4 status is Open. Original cause of wound was Gradually Appeared. The wound is located on the Right,Medial Lower Leg. The wound measures 0.3cm length x 0.5cm width x 0.1cm depth; 0.118cm^2 area and 0.012cm^3 volume. The wound is limited to skin breakdown. There is no tunneling or undermining noted. There is a medium amount of  serosanguineous drainage noted. The wound margin is distinct with the outline attached to the wound base. There is large (67-100%) pink, pale granulation within the wound bed. There is a small (1-33%) amount of necrotic tissue within the wound bed including Adherent Slough. The periwound skin appearance exhibited: Localized Edema, Moist. The periwound skin appearance did not exhibit: Callus, Crepitus, Excoriation, Fluctuance, Friable, Induration, Rash, Scarring, Dry/Scaly, Maceration, Atrophie Blanche, Cyanosis, Ecchymosis, Hemosiderin Staining, Mottled, Pallor, Rubor, Erythema. Periwound temperature was noted as No Abnormality. The periwound has tenderness on palpation. Wound #5 status is Open. Original cause of wound was Gradually Appeared. The wound is located on the Right,Posterior Calcaneus. The wound measures 2.3cm length x 1.9cm width x 0.2cm depth; 3.432cm^2 area and 0.686cm^3 volume. The wound is limited to skin breakdown. There is no tunneling or undermining noted. There is a large amount of serosanguineous drainage noted. The wound margin is distinct with the outline attached to the wound base. There is large (67-100%) pink, pale granulation within the wound bed. There is a small (1-33%) amount of necrotic tissue within the wound bed including Adherent Slough. The periwound skin appearance exhibited: Localized Edema, Maceration, Moist, Mottled. The periwound skin appearance did not exhibit: Callus, Crepitus, Excoriation, Fluctuance, Friable, Induration, Rash, Scarring, Dry/Scaly, Atrophie Blanche, Cyanosis, Ecchymosis, Hemosiderin Staining, Pallor, Rubor, Erythema.  Periwound temperature was noted as No Abnormality. The periwound has tenderness on palpation. Assessment Active Problems ICD-10 E11.621 - Type 2 diabetes mellitus with foot ulcer L89.613 - Pressure ulcer of right heel, stage 3 L97.212 - Non-pressure chronic ulcer of right calf with fat layer exposed L97.211 -  Non-pressure chronic ulcer of right calf limited to breakdown of skin Z79.01 - Long term (current) use of anticoagulants Procedures Wound #5 Wound #5 is a Diabetic Wound/Ulcer of the Lower Extremity located on the Right,Posterior Calcaneus . There was a Skin/Subcutaneous Tissue Debridement [id number]) debridement with total area of 4.37 sq cm performed by [name], MD. with the following instrument(s): Curette to remove Non-Viable tissue/material including Fibrin/Slough and Subcutaneous after achieving pain control using Lidocaine 4% Topical Solution. A time out was conducted prior to the start of the procedure. A Minimum amount of [name], [name] ([id number]) bleeding was controlled with Pressure. The procedure was tolerated well with a pain level of 0 throughout and a pain level of 0 following the procedure. Post Debridement Measurements: 2.3cm length x 1.9cm width x 0.2cm depth; 0.686cm^3 volume. Post procedure Diagnosis Wound #5: Same as Pre-Procedure Plan Wound Cleansing: Wound #4 Right,Medial Lower Leg: Clean wound with Normal Saline. - in clinic Cleanse wound with mild soap and water Wound #5 Right,Posterior Calcaneus: Clean wound with Normal Saline. - in clinic Cleanse wound with mild soap and water Anesthetic: Wound #4 Right,Medial Lower Leg: Topical Lidocaine 4% cream applied to wound bed prior to debridement Wound #5 Right,Posterior Calcaneus: Topical Lidocaine 4% cream applied to wound bed prior to debridement Primary Wound Dressing: Wound #4 Right,Medial Lower Leg: Prisma Ag Wound #5 Right,Posterior Calcaneus: Santyl Ointment Prisma Ag Secondary Dressing: Wound #4 Right,Medial Lower Leg: Gauze and Kerlix/Conform Wound #5 Right,Posterior Calcaneus: Dry Gauze Foam - foam heel cup Dressing Change Frequency: Wound #4 Right,Medial Lower Leg: Change dressing every day. Wound #5 Right,Posterior Calcaneus: Change dressing every day. Follow-up  Appointments: Return Appointment in 1 week. Off-Loading: Other: - float heels with pillow under calves when in bed; wear pressure relieving boots while in bed Additional Orders / Instructions: Wound #4 Right,Medial Lower Leg: Increase protein intake. Activity as tolerated Wound #5 Right,Posterior Calcaneus: [name], [name]. ([id number]) Increase protein intake. Activity as tolerated I have recommended: 1. Santyl ointment to the wounds on the right heel. 2. Prisma AG on the right lower extremity to be covered with a bordered foam. 3. Good control of her diabetes 4. Adequate nutrition with protein supplements, vitamin A, vitamin C and zinc 5. Regular visits to the wound center Electronic Signature(s) Signed: 01/13/2016 3:38:55 PM By: [name], FACS Entered By: [name] on 01/13/2016 15:38:55 [name] ([id number]) -------------------------------------------------------------------------------- SuperBill Details Patient Name: [name] Date of Service: 01/13/2016 Medical Record Number: [id number] Patient Account Number: [id number] Date of Birth/Sex: [birth year] (80 y.o. Female) Treating RN: [name] Primary Care Physician: Halina Maidens Other Clinician: Referring Physician: Halina Maidens Treating Physician/Extender: Frann Rider in Treatment: 6 Diagnosis Coding ICD-10 Codes Code Description [phone] Type 2 diabetes mellitus with foot ulcer L89.613 Pressure ulcer of right heel, stage 3 L97.212 Non-pressure chronic ulcer of right calf with fat layer exposed L97.211 Non-pressure chronic ulcer of right calf limited to breakdown of skin Z79.01 Long term (current) use of anticoagulants Facility Procedures CPT4 Code: [id number] Description: [id number] - DEB SUBQ TISSUE 20 SQ CM/< ICD-10 Description Diagnosis E11.621 Type 2 diabetes mellitus with foot ulcer L89.613 Pressure ulcer of right heel, stage 3 L97.212 Non-pressure chronic ulcer  of right calf  with fat Modifier: layer exposed Quantity: 1 Physician Procedures CPT4 Code: [id number] Description: [id number] - WC PHYS SUBQ TISS 20 SQ CM ICD-10 Description Diagnosis E11.621 Type 2 diabetes mellitus with foot ulcer L89.613 Pressure ulcer of right heel, stage 3 L97.212 Non-pressure chronic ulcer of right calf with fat Modifier: layer exposed Quantity: 1 Electronic Signature(s) Signed: 01/13/2016 3:39:11 PM By: [name], FACS Entered By: [name] on 01/13/2016 15:39:10

## 2016-01-14 NOTE — Progress Notes (Signed)
SUZANNE, ODOHERTY ([id number]) Visit Report for 01/13/2016 Arrival Information Details Patient Name: [name], [name]. Date of Service: 01/13/2016 3:00 PM Medical Record Number: [id number] Patient Account Number: [id number] Date of Birth/Sex: 04/25/1936 (79 y.o. Female) Treating RN: [name] Primary Care Physician: Halina Maidens Other Clinician: Referring Physician: Halina Maidens Treating Physician/Extender: Frann Rider in Treatment: 6 Visit Information History Since Last Visit Added or deleted any medications: No Patient Arrived: Wheel Chair Any new allergies or adverse reactions: No Arrival Time: 15:12 Had a fall or experienced change in No Accompanied By: spouse and cg activities of daily living that may affect Transfer Assistance: None risk of falls: Patient Identification Verified: Yes Signs or symptoms of abuse/neglect since last No Secondary Verification Process Yes visito Completed: Hospitalized since last visit: No Patient Requires Transmission- No Pain Present Now: No Based Precautions: Patient Has Alerts: Yes Patient Alerts: Patient on Blood Thinner Electronic Signature(s) Signed: 01/13/2016 4:49:27 PM By: [name] Entered By: [name] on 01/13/2016 15:14:50 [name] ([id number]) -------------------------------------------------------------------------------- Encounter Discharge Information Details Patient Name: [name] Date of Service: 01/13/2016 3:00 PM Medical Record Number: [id number] Patient Account Number: [id number] Date of Birth/Sex: 1935-12-15 (79 y.o. Female) Treating RN: [name] Primary Care Physician: Halina Maidens Other Clinician: Referring Physician: Halina Maidens Treating Physician/Extender: Frann Rider in Treatment: 6 Encounter Discharge Information Items Discharge Pain Level: 0 Discharge Condition: Stable Ambulatory Status: Wheelchair Discharge Destination: Home Transportation:  Private Auto Accompanied By: spouse Schedule Follow-up Appointment: Yes Medication Reconciliation completed No and provided to Patient/Care Alphonsine Minium: Provided on Clinical Summary of Care: 01/13/2016 Form Type Recipient Paper Patient LK Electronic Signature(s) Signed: 01/13/2016 4:46:07 PM By: [name] Previous Signature: 01/13/2016 3:49:07 PM Version By: Ruthine Dose Entered By: [name] on 01/13/2016 16:46:07 [name] ([id number]) -------------------------------------------------------------------------------- Lower Extremity Assessment Details Patient Name: [name] Date of Service: 01/13/2016 3:00 PM Medical Record Number: [id number] Patient Account Number: [id number] Date of Birth/Sex: 09-24-1935 (79 y.o. Female) Treating RN: [name] Primary Care Physician: Halina Maidens Other Clinician: Referring Physician: Halina Maidens Treating Physician/Extender: Frann Rider in Treatment: 6 Vascular Assessment Pulses: Posterior Tibial Dorsalis Pedis Palpable: [Right:Yes] Extremity colors, hair growth, and conditions: Extremity Color: [Right:Normal] Hair Growth on Extremity: [Right:No] Temperature of Extremity: [Right:Warm] Capillary Refill: [Right:< 3 seconds] Electronic Signature(s) Signed: 01/13/2016 4:49:27 PM By: [name] Entered By: [name] on 01/13/2016 15:27:20 [name] ([id number]) -------------------------------------------------------------------------------- Multi Wound Chart Details Patient Name: [name] Date of Service: 01/13/2016 3:00 PM Medical Record Number: [id number] Patient Account Number: [id number] Date of Birth/Sex: 06/28/1935 (79 y.o. Female) Treating RN: Baruch Gouty, RN, BSN, [name] Primary Care Physician: Halina Maidens Other Clinician: Referring Physician: Halina Maidens Treating Physician/Extender: Frann Rider in Treatment: 6 Vital Signs Height(in): 64 Pulse(bpm):  65 Weight(lbs): 110 Blood Pressure 153/51 (mmHg): Body Mass Index(BMI): 19 Temperature(F): 98.3 Respiratory Rate 16 (breaths/min): Photos: [N/A:N/A] Wound Location: Right Lower Leg - Medial Right Calcaneus - N/A Posterior Wounding Event: Gradually Appeared Gradually Appeared N/A Primary Etiology: Diabetic Wound/Ulcer of Diabetic Wound/Ulcer of N/A the Lower Extremity the Lower Extremity Comorbid History: Chronic Obstructive Chronic Obstructive N/A Pulmonary Disease Pulmonary Disease (COPD), Angina, (COPD), Angina, Congestive Heart Failure, Congestive Heart Failure, Type II Diabetes, Lupus Type II Diabetes, Lupus Erythematosus, Erythematosus, Osteoarthritis, Dementia Osteoarthritis, Dementia Date Acquired: 11/03/2015 11/03/2015 N/A Weeks of Treatment: 6 6 N/A Wound Status: Open Open N/A Measurements L x W x D 0.3x0.5x0.1 2.3x1.9x0.2 N/A (cm) Area (cm) : 0.118 3.432 N/A Volume (cm) : 0.012  0.686 N/A % Reduction in Area: 95.20% 64.10% N/A % Reduction in Volume: 95.10% 28.20% N/A Classification: Grade 1 Grade 1 N/A Exudate Amount: Medium Large N/A [name], [name] ([id number]) Exudate Type: Serosanguineous Serosanguineous N/A Exudate Color: red, brown red, brown N/A Foul Odor After No Yes N/A Cleansing: Odor Anticipated Due to N/A No N/A Product Use: Wound Margin: Distinct, outline attached Distinct, outline attached N/A Granulation Amount: Large (67-100%) Large (67-100%) N/A Granulation Quality: Pink, Pale Pink, Pale N/A Necrotic Amount: Small (1-33%) Small (1-33%) N/A Exposed Structures: Fascia: No Fascia: No N/A Fat: No Fat: No Tendon: No Tendon: No Muscle: No Muscle: No Joint: No Joint: No Bone: No Bone: No Limited to Skin Limited to Skin Breakdown Breakdown Epithelialization: Small (1-33%) Small (1-33%) N/A Periwound Skin Texture: Edema: Yes Edema: Yes N/A Excoriation: No Excoriation: No Induration: No Induration: No Callus: No Callus:  No Crepitus: No Crepitus: No Fluctuance: No Fluctuance: No Friable: No Friable: No Rash: No Rash: No Scarring: No Scarring: No Periwound Skin Moist: Yes Maceration: Yes N/A Moisture: Maceration: No Moist: Yes Dry/Scaly: No Dry/Scaly: No Periwound Skin Color: Atrophie Blanche: No Mottled: Yes N/A Cyanosis: No Atrophie Blanche: No Ecchymosis: No Cyanosis: No Erythema: No Ecchymosis: No Hemosiderin Staining: No Erythema: No Mottled: No Hemosiderin Staining: No Pallor: No Pallor: No Rubor: No Rubor: No Temperature: No Abnormality No Abnormality N/A Tenderness on Yes Yes N/A Palpation: Wound Preparation: Ulcer Cleansing: Ulcer Cleansing: N/A Rinsed/Irrigated with Rinsed/Irrigated with Saline Saline Topical Anesthetic Topical Anesthetic Applied: Other: lidocaine Applied: Other: lidocaine 4% 4% [name], [name] ([id number]) Treatment Notes Electronic Signature(s) Signed: 01/13/2016 4:00:53 PM By: Elpidio Eric BSN, RN Entered By: Elpidio Eric on 01/13/2016 15:32:44 [name] ([id number]) -------------------------------------------------------------------------------- Multi-Disciplinary Care Plan Details Patient Name: [name] Date of Service: 01/13/2016 3:00 PM Medical Record Number: [id number] Patient Account Number: [id number] Date of Birth/Sex: 16-Jul-1935 (79 y.o. Female) Treating RN: Clover Mealy, RN, BSN, [place] Primary Care Physician: Bari Edward Other Clinician: Referring Physician: Bari Edward Treating Physician/Extender: Rudene Re in Treatment: 6 Active Inactive Abuse / Safety / Falls / Self Care Management Nursing Diagnoses: Potential for falls Goals: Patient/caregiver will verbalize understanding of skin care regimen Date Initiated: 12/02/2015 Goal Status: Active Patient/caregiver will verbalize/demonstrate measures taken to prevent injury and/or falls Date Initiated: 12/02/2015 Goal Status: Active Interventions: Assess fall  risk on admission and as needed Assess: immobility, friction, shearing, incontinence upon admission and as needed Assess impairment of mobility on admission and as needed per policy Assess self care needs on admission and as needed Provide education on fall prevention Provide education on safe transfers Notes: Wound/Skin Impairment Nursing Diagnoses: Impaired tissue integrity Goals: Patient/caregiver will verbalize understanding of skin care regimen Date Initiated: 12/02/2015 Goal Status: Active Interventions: Assess patient/caregiver ability to obtain necessary supplies Assess patient/caregiver ability to perform ulcer/skin care regimen upon admission and as needed ONEIKA, EXNER ([id number]) Assess ulceration(s) every visit Provide education on ulcer and skin care Notes: Electronic Signature(s) Signed: 01/13/2016 4:00:53 PM By: Elpidio Eric BSN, RN Entered By: Elpidio Eric on 01/13/2016 15:32:34 [name] ([id number]) -------------------------------------------------------------------------------- Pain Assessment Details Patient Name: [name] Date of Service: 01/13/2016 3:00 PM Medical Record Number: [id number] Patient Account Number: [id number] Date of Birth/Sex: [birth year] (79 y.o. Female) Treating RN: Curtis Sites Primary Care Physician: Bari Edward Other Clinician: Referring Physician: Bari Edward Treating Physician/Extender: Rudene Re in Treatment: 6 Active Problems Location of Pain Severity and Description of Pain Patient Has Paino No Site Locations Pain Management and Medication  Current Pain Management: Notes Topical or injectable lidocaine is offered to patient for acute pain when surgical debridement is performed. If needed, Patient is instructed to use over the counter pain medication for the following 24-48 hours after debridement. Wound care MDs do not prescribed pain medications. Patient has chronic pain or uncontrolled  pain. Patient has been instructed to make an appointment with their Primary Care Physician for pain management. Electronic Signature(s) Signed: 01/13/2016 4:49:27 PM By: [name] Entered By: [name] on 01/13/2016 15:15:01 [name] ([id number]) -------------------------------------------------------------------------------- Patient/Caregiver Education Details Patient Name: [name] Date of Service: 01/13/2016 3:00 PM Medical Record Number: [id number] Patient Account Number: [id number] Date of Birth/Gender: 1935/10/15 (80 y.o. Female) Treating RN: [name] Primary Care Physician: Halina Maidens Other Clinician: Referring Physician: Halina Maidens Treating Physician/Extender: Frann Rider in Treatment: 6 Education Assessment Education Provided To: Caregiver Education Topics Provided Wound/Skin Impairment: Handouts: Other: wound care as ordered Methods: Demonstration, Explain/Verbal Responses: State content correctly Electronic Signature(s) Signed: 01/13/2016 4:49:27 PM By: [name] Entered By: [name] on 01/13/2016 16:46:22 [name] ([id number]) -------------------------------------------------------------------------------- Wound Assessment Details Patient Name: [name] Date of Service: 01/13/2016 3:00 PM Medical Record Number: [id number] Patient Account Number: [id number] Date of Birth/Sex: 14-Sep-1935 (79 y.o. Female) Treating RN: [name] Primary Care Physician: Halina Maidens Other Clinician: Referring Physician: Halina Maidens Treating Physician/Extender: Frann Rider in Treatment: 6 Wound Status Wound Number: 4 Primary Diabetic Wound/Ulcer of the Lower Etiology: Extremity Wound Location: Right Lower Leg - Medial Wound Open Wounding Event: Gradually Appeared Status: Date Acquired: 11/03/2015 Comorbid Chronic Obstructive Pulmonary Disease Weeks Of Treatment: 6 History: (COPD), Angina,  Congestive Heart Clustered Wound: No Failure, Type II Diabetes, Lupus Erythematosus, Osteoarthritis, Dementia Photos Wound Measurements Length: (cm) 0.3 Width: (cm) 0.5 Depth: (cm) 0.1 Area: (cm) 0.118 Volume: (cm) 0.012 % Reduction in Area: 95.2% % Reduction in Volume: 95.1% Epithelialization: Small (1-33%) Tunneling: No Undermining: No Wound Description Classification: Grade 1 Wound Margin: Distinct, outline attached Exudate Amount: Medium Exudate Type: Serosanguineous Exudate Color: red, brown Foul Odor After Cleansing: No Wound Bed Granulation Amount: Large (67-100%) Exposed Structure Granulation Quality: Pink, Pale Fascia Exposed: No Necrotic Amount: Small (1-33%) Fat Layer Exposed: No Necrotic Quality: Adherent Slough Tendon Exposed: No [name], [name] ([id number]) Muscle Exposed: No Joint Exposed: No Bone Exposed: No Limited to Skin Breakdown Periwound Skin Texture Texture Color No Abnormalities Noted: No No Abnormalities Noted: No Callus: No Atrophie Blanche: No Crepitus: No Cyanosis: No Excoriation: No Ecchymosis: No Fluctuance: No Erythema: No Friable: No Hemosiderin Staining: No Induration: No Mottled: No Localized Edema: Yes Pallor: No Rash: No Rubor: No Scarring: No Temperature / Pain Moisture Temperature: No Abnormality No Abnormalities Noted: No Tenderness on Palpation: Yes Dry / Scaly: No Maceration: No Moist: Yes Wound Preparation Ulcer Cleansing: Rinsed/Irrigated with Saline Topical Anesthetic Applied: Other: lidocaine 4%, Treatment Notes Wound #4 (Right, Medial Lower Leg) 1. Cleansed with: Clean wound with Normal Saline 2. Anesthetic Topical Lidocaine 4% cream to wound bed prior to debridement 4. Dressing Applied: Prisma Ag 5. Secondary Dressing Applied Bordered Foam Dressing Electronic Signature(s) Signed: 01/13/2016 4:49:27 PM By: [name] Entered By: [name] on 01/13/2016 15:25:04 [name]  ([id number]) -------------------------------------------------------------------------------- Wound Assessment Details Patient Name: [name] Date of Service: 01/13/2016 3:00 PM Medical Record Number: [id number] Patient Account Number: [id number] Date of Birth/Sex: Feb 03, 1936 (79 y.o. Female) Treating RN: [name] Primary Care Physician: Halina Maidens Other Clinician: Referring Physician: Halina Maidens Treating Physician/Extender: [name]  Weeks in Treatment: 6 Wound Status Wound Number: 5 Primary Diabetic Wound/Ulcer of the Lower Etiology: Extremity Wound Location: Right Calcaneus - Posterior Wound Open Wounding Event: Gradually Appeared Status: Date Acquired: 11/03/2015 Comorbid Chronic Obstructive Pulmonary Disease Weeks Of Treatment: 6 History: (COPD), Angina, Congestive Heart Clustered Wound: No Failure, Type II Diabetes, Lupus Erythematosus, Osteoarthritis, Dementia Photos Wound Measurements Length: (cm) 2.3 Width: (cm) 1.9 Depth: (cm) 0.2 Area: (cm) 3.432 Volume: (cm) 0.686 % Reduction in Area: 64.1% % Reduction in Volume: 28.2% Epithelialization: Small (1-33%) Tunneling: No Undermining: No Wound Description Classification: Grade 1 Wound Margin: Distinct, outline attached Exudate Amount: Large Exudate Type: Serosanguineous Exudate Color: red, brown Foul Odor After Cleansing: Yes Due to Product Use: No Wound Bed Granulation Amount: Large (67-100%) Exposed Structure Granulation Quality: Pink, Pale Fascia Exposed: No Necrotic Amount: Small (1-33%) Fat Layer Exposed: No Necrotic Quality: Adherent Slough Tendon Exposed: No [name], [name] ([id number]) Muscle Exposed: No Joint Exposed: No Bone Exposed: No Limited to Skin Breakdown Periwound Skin Texture Texture Color No Abnormalities Noted: No No Abnormalities Noted: No Callus: No Atrophie Blanche: No Crepitus: No Cyanosis: No Excoriation: No Ecchymosis: No Fluctuance:  No Erythema: No Friable: No Hemosiderin Staining: No Induration: No Mottled: Yes Localized Edema: Yes Pallor: No Rash: No Rubor: No Scarring: No Temperature / Pain Moisture Temperature: No Abnormality No Abnormalities Noted: No Tenderness on Palpation: Yes Dry / Scaly: No Maceration: Yes Moist: Yes Wound Preparation Ulcer Cleansing: Rinsed/Irrigated with Saline Topical Anesthetic Applied: Other: lidocaine 4%, Treatment Notes Wound #5 (Right, Posterior Calcaneus) 1. Cleansed with: Clean wound with Normal Saline 2. Anesthetic Topical Lidocaine 4% cream to wound bed prior to debridement 4. Dressing Applied: Santyl Ointment 5. Secondary Dressing Applied Gauze and Kerlix/Conform Electronic Signature(s) Signed: 01/13/2016 4:49:27 PM By: [name] Entered By: [name] on 01/13/2016 15:25:30 [name] ([id number]) -------------------------------------------------------------------------------- [place] Details Patient Name: [name] Date of Service: 01/13/2016 3:00 PM Medical Record Number: [id number] Patient Account Number: [id number] Date of Birth/Sex: 12/12/1935 (79 y.o. Female) Treating RN: [name] Primary Care Physician: Halina Maidens Other Clinician: Referring Physician: Halina Maidens Treating Physician/Extender: Frann Rider in Treatment: 6 Vital Signs Time Taken: 15:15 Temperature (F): 98.3 Height (in): 64 Pulse (bpm): 65 Weight (lbs): 110 Respiratory Rate (breaths/min): 16 Body Mass Index (BMI): 18.9 Blood Pressure (mmHg): 153/51 Reference Range: 80 - 120 mg / dl Electronic Signature(s) Signed: 01/13/2016 4:49:27 PM By: [name] Entered By: [name] on 01/13/2016 15:15:57

## 2016-01-15 ENCOUNTER — Emergency Department: Payer: Medicare Other

## 2016-01-15 ENCOUNTER — Emergency Department
Admission: EM | Admit: 2016-01-15 | Discharge: 2016-01-15 | Disposition: A | Discharge status: Home / Self Care | Payer: Medicare Other | Attending: Emergency Medicine | Admitting: Emergency Medicine

## 2016-01-15 ENCOUNTER — Encounter: Payer: Self-pay | Admitting: Emergency Medicine

## 2016-01-15 DIAGNOSIS — I5032 Chronic diastolic (congestive) heart failure: Secondary | ICD-10-CM | POA: Diagnosis not present

## 2016-01-15 DIAGNOSIS — J449 Chronic obstructive pulmonary disease, unspecified: Secondary | ICD-10-CM | POA: Insufficient documentation

## 2016-01-15 DIAGNOSIS — E1122 Type 2 diabetes mellitus with diabetic chronic kidney disease: Secondary | ICD-10-CM | POA: Insufficient documentation

## 2016-01-15 DIAGNOSIS — R300 Dysuria: Secondary | ICD-10-CM | POA: Diagnosis present

## 2016-01-15 DIAGNOSIS — N3090 Cystitis, unspecified without hematuria: Secondary | ICD-10-CM

## 2016-01-15 DIAGNOSIS — Z79899 Other long term (current) drug therapy: Secondary | ICD-10-CM | POA: Insufficient documentation

## 2016-01-15 DIAGNOSIS — E039 Hypothyroidism, unspecified: Secondary | ICD-10-CM | POA: Diagnosis not present

## 2016-01-15 DIAGNOSIS — J45909 Unspecified asthma, uncomplicated: Secondary | ICD-10-CM | POA: Diagnosis not present

## 2016-01-15 DIAGNOSIS — N281 Cyst of kidney, acquired: Secondary | ICD-10-CM | POA: Insufficient documentation

## 2016-01-15 DIAGNOSIS — Z87891 Personal history of nicotine dependence: Secondary | ICD-10-CM | POA: Insufficient documentation

## 2016-01-15 DIAGNOSIS — N200 Calculus of kidney: Secondary | ICD-10-CM | POA: Diagnosis not present

## 2016-01-15 DIAGNOSIS — Z7984 Long term (current) use of oral hypoglycemic drugs: Secondary | ICD-10-CM | POA: Insufficient documentation

## 2016-01-15 DIAGNOSIS — I130 Hypertensive heart and chronic kidney disease with heart failure and stage 1 through stage 4 chronic kidney disease, or unspecified chronic kidney disease: Secondary | ICD-10-CM | POA: Insufficient documentation

## 2016-01-15 DIAGNOSIS — G309 Alzheimer's disease, unspecified: Secondary | ICD-10-CM | POA: Diagnosis not present

## 2016-01-15 DIAGNOSIS — N3091 Cystitis, unspecified with hematuria: Secondary | ICD-10-CM | POA: Diagnosis not present

## 2016-01-15 DIAGNOSIS — N181 Chronic kidney disease, stage 1: Secondary | ICD-10-CM | POA: Diagnosis not present

## 2016-01-15 DIAGNOSIS — R319 Hematuria, unspecified: Secondary | ICD-10-CM

## 2016-01-15 LAB — CBC
HCT: 30.9 % — ABNORMAL LOW (ref 35.0–47.0)
Hemoglobin: 10.4 g/dL — ABNORMAL LOW (ref 12.0–16.0)
MCH: 28.9 pg (ref 26.0–34.0)
MCHC: 33.7 g/dL (ref 32.0–36.0)
MCV: 85.8 fL (ref 80.0–100.0)
Platelets: 227 10*3/uL (ref 150–440)
RBC: 3.6 MIL/uL — ABNORMAL LOW (ref 3.80–5.20)
RDW: 16.2 % — ABNORMAL HIGH (ref 11.5–14.5)
WBC: 7 10*3/uL (ref 3.6–11.0)

## 2016-01-15 LAB — BASIC METABOLIC PANEL
Anion gap: 6 (ref 5–15)
BUN: 18 mg/dL (ref 6–20)
CHLORIDE: 102 mmol/L (ref 101–111)
CO2: 33 mmol/L — ABNORMAL HIGH (ref 22–32)
CREATININE: 0.66 mg/dL (ref 0.44–1.00)
Calcium: 9.2 mg/dL (ref 8.9–10.3)
GFR calc Af Amer: >60 mL/min (ref >60)
GFR calc non Af Amer: >60 mL/min (ref >60)
GLUCOSE: 170 mg/dL — AB (ref 65–99)
POTASSIUM: 3.9 mmol/L (ref 3.5–5.1)
Sodium: 141 mmol/L (ref 135–145)

## 2016-01-15 LAB — URINALYSIS COMPLETE WITH MICROSCOPIC (ARMC ONLY)
Bilirubin Urine: NEGATIVE
GLUCOSE, UA: 150 mg/dL — AB
KETONES UR: NEGATIVE mg/dL
NITRITE: NEGATIVE
PROTEIN: 100 mg/dL — AB
SPECIFIC GRAVITY, URINE: 1.018 (ref 1.005–1.030)
Squamous Epithelial / LPF: NONE SEEN
pH: 5 (ref 5.0–8.0)

## 2016-01-15 LAB — GLUCOSE, CAPILLARY: GLUCOSE-CAPILLARY: 130 mg/dL — AB (ref 65–99)

## 2016-01-15 MED ORDER — CEFTRIAXONE SODIUM 1 G IJ SOLR
1.0000 g | Freq: Once | INTRAMUSCULAR | Status: AC
  Administered 2016-01-15: 1 g via INTRAVENOUS
  Filled 2016-01-15: qty 10

## 2016-01-15 MED ORDER — CIPROFLOXACIN HCL 500 MG PO TABS: 500.0000 mg | ORAL_TABLET | Freq: Two times a day (BID) | ORAL | 0 refills | Status: AC

## 2016-01-15 NOTE — ED Provider Notes (Signed)
[facility] Emergency Department Provider Note        Time seen: ----------------------------------------- 5:23 PM on 01/15/2016 -----------------------------------------    I have reviewed the triage vital signs and the nursing notes.   HISTORY  Chief Complaint Hematuria    HPI [name] is a 80 y.o. female who presents the ER for hematuria. Patient reportedly has had blood in her urine intermittently. Patient states it looks like watermelon juice. She has had a history of same the past with resulting nephrolithiasis. She has had some burning with urination. She currently takes Xarelto for paroxysmal atrial fibrillation. She denies fevers, states she has chills. She denies any back pain but has had vomiting. She denies diarrhea.   Past Medical History:  Diagnosis Date  . Ankylosing spondylitis ([place])    ? how diagnosed  . Anxiety   . Asthma    as child  . Chronic diastolic CHF (congestive heart failure) ([place])    a. 03/2015 Echo: EF 55-60%, Gr 1 DD.  Marland Kitchen Chronic kidney infection   . Chronic peptic ulcer, unspecified site, without mention of hemorrhage, perforation, or obstruction   . COPD (chronic obstructive pulmonary disease) ([place])   . COPD (chronic obstructive pulmonary disease) ([place])    a.Uses 2l prn - followed by Dr. Raul Del.  . Diabetes mellitus ([place])   . DM (diabetes mellitus) ([place])   . GERD (gastroesophageal reflux disease)   . Headache   . History of kidney stones   . Hypothyroidism    mass, U/S 6/21 - nodules  . IBS (irritable bowel syndrome)   . Insomnia, unspecified   . Left displaced femoral neck fracture ([place])    a. 03/2015 s/p L hemiarthroplasty.  . Lumbar spondylolysis   . Mitral valve prolapse    a. Not noted on 03/2015 Echo.  . Motion sickness    car - back seat  . Neuropathy ([place])   . Osteoarthritis   . PAF (paroxysmal atrial fibrillation) ([place])    a. 03/2015 in setting of hip Fx->Amio/Xarelo;  b. CHA2DS2VASc=  4.  . Palpitations   . Raynaud disease   . Raynaud's disease   . Rheumatic fever   . Systemic lupus erythematosus ([place])   . Uveitis, anterior    ????  . Wears contact lenses   . Wears dentures    full upper, partial lower  . Wheezing     Patient Active Problem List   Diagnosis Date Noted  . DNR (do not resuscitate) 12/29/2015  . Palliative care encounter 12/29/2015  . Dementia   . Demand ischemia ([place]) 12/23/2015  . Metabolic encephalopathy [id number]  . Sepsis ([place]) 12/23/2015  . Pneumonia 12/22/2015  . Closed right hip fracture ([place]) 11/04/2015  . Chronic respiratory failure with hypoxia ([place]) 10/27/2015  . Mixed Alzheimer's and vascular dementia 07/29/2015  . Type 2 diabetes mellitus with stage 1 chronic kidney disease, without long-term current use of insulin ([place]) 07/16/2015  . Tremor observed on examination 07/16/2015  . Urinary retention 07/12/2015  . Stage III pressure ulcer of right heel ([place]) 07/12/2015  . Malnutrition of moderate degree 07/03/2015  . 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 Paroxysmal atrial fibrillation ([place]) 06/25/2015  . Chronic diastolic CHF (congestive heart failure) ([place])   . Left displaced femoral neck fracture ([place])   . Fall   . GERD (gastroesophageal reflux disease) 03/26/2015  . Anxiety 03/26/2015  . Gastroduodenal ulcer 01/05/2015  . Difficulty swallowing solids   . Esophageal candidiasis ([place])   .  Acquired hypothyroidism 08/18/2014  . Systemic lupus erythematosus ([place]) 04/03/2014    Past Surgical History:  Procedure Laterality Date  . BIOPSY THYROID    . BREAST BIOPSY    . CATARACT EXTRACTION W/PHACO Left 03/23/2015   Procedure: CATARACT EXTRACTION PHACO AND INTRAOCULAR LENS PLACEMENT (IOC);  Surgeon: Birder Robson, MD;  Location: [facility] ORS;  Service: Ophthalmology;  Laterality: Left;  [place] 00:51   . CATARACT EXTRACTION W/PHACO Right 05/25/2015   Procedure: CATARACT EXTRACTION PHACO AND INTRAOCULAR LENS PLACEMENT (IOC);   Surgeon: Birder Robson, MD;  Location: [facility];  Service: Ophthalmology;  Laterality: Right;  [place] 00:57   . COLONOSCOPY  multiple  . ESOPHAGOGASTRODUODENOSCOPY  multiple  . ESOPHAGOGASTRODUODENOSCOPY (EGD) WITH PROPOFOL N/A 12/11/2014   Procedure: ESOPHAGOGASTRODUODENOSCOPY (EGD) WITH PROPOFOL;  Surgeon: [name], MD;  Location: [place];  Service: Endoscopy;  Laterality: N/A;  cytology brushing  . HIP ARTHROPLASTY Left 03/26/2015   Procedure: ARTHROPLASTY BIPOLAR HIP (HEMIARTHROPLASTY);  Surgeon: Earnestine Leys, MD;  Location: [facility];  Service: Orthopedics;  Laterality: Left;  . HIP ARTHROPLASTY Right 11/06/2015   Procedure: ARTHROPLASTY BIPOLAR HIP (HEMIARTHROPLASTY);  Surgeon: Thornton Park, MD;  Location: [facility];  Service: Orthopedics;  Laterality: Right;  . JOINT REPLACEMENT Left   . PERIPHERAL VASCULAR CATHETERIZATION N/A 12/27/2015   Procedure: Lower Extremity Angiography;  Surgeon: Algernon Huxley, MD;  Location: [facility];  Service: Cardiovascular;  Laterality: N/A;  . PERIPHERAL VASCULAR CATHETERIZATION  12/27/2015   Procedure: Lower Extremity Intervention;  Surgeon: Algernon Huxley, MD;  Location: [facility];  Service: Cardiovascular;;  . TOTAL ABDOMINAL HYSTERECTOMY W/ BILATERAL SALPINGOOPHORECTOMY  1990    Allergies Codeine; Penicillins; Sulfa antibiotics; Doxycycline; Erythromycin; and Latex  Social History Social History  Substance Use Topics  . Smoking status: Former Smoker    Packs/day: 0.25    Years: 40.00    Types: Cigarettes    Quit date: 10/11/2011  . Smokeless tobacco: Never Used     Comment: Quit 3 years  . Alcohol use No    Review of Systems Constitutional: Negative for fever. Cardiovascular: Negative for chest pain. Respiratory: Negative for shortness of breath. Gastrointestinal: Negative for abdominal pain, vomiting and diarrhea. Genitourinary: Positive for dysuria, hematuria Musculoskeletal: Negative for back pain. Skin:  Negative for rash. Neurological: Negative for headaches, focal weakness or numbness.  10-point ROS otherwise negative.  ____________________________________________   PHYSICAL EXAM:  VITAL SIGNS: ED Triage Vitals  Enc Vitals Group     BP 01/15/16 1601 127/67     Pulse Rate 01/15/16 1601 62     Resp 01/15/16 1601 18     Temp 01/15/16 1601 98.5 F (36.9 C)     Temp Source 01/15/16 1601 Oral     SpO2 01/15/16 1601 100 %     Weight 01/15/16 1601 98 lb (44.5 kg)     Height 01/15/16 1601 5\' 4"  (1.626 m)     Head Circumference --      Peak Flow --      Pain Score 01/15/16 1658 0     Pain Loc --      Pain Edu? --      Excl. in [place]? --     Constitutional: Alert and oriented. Well appearing and in no distress. Eyes: Conjunctivae are normal. PERRL. Normal extraocular movements. ENT   Head: Normocephalic and atraumatic.   Nose: No congestion/rhinnorhea.   Mouth/Throat: Mucous membranes are moist.   Neck: No stridor. Cardiovascular: Normal rate, regular rhythm. No murmurs,  rubs, or gallops. Respiratory: Normal respiratory effort without tachypnea nor retractions. Breath sounds are clear and equal bilaterally. No wheezes/rales/rhonchi. Gastrointestinal: Soft and nontender. Normal bowel sounds, no CVA tenderness Musculoskeletal: Nontender with normal range of motion in all extremities. No lower extremity tenderness nor edema. Neurologic:  Normal speech and language. No gross focal neurologic deficits are appreciated.  Skin:  Skin is warm, dry and intact. No rash noted. Psychiatric: Mood and affect are normal. Speech and behavior are normal.  ____________________________________________  ED COURSE:  Pertinent labs & imaging results that were available during my care of the patient were reviewed by me and considered in my medical decision making (see chart for details). Clinical Course  Patient is in no acute distress, we will check basic labs, urinalysis and consider CT  imaging.  Procedures ____________________________________________   LABS (pertinent positives/negatives)  Labs Reviewed  URINALYSIS COMPLETEWITH MICROSCOPIC ([facility] ONLY) - Abnormal; Notable for the following:       Result Value   Color, Urine YELLOW (*)    APPearance TURBID (*)    Glucose, UA 150 (*)    Hgb urine dipstick 3+ (*)    Protein, ur 100 (*)    Leukocytes, UA 3+ (*)    Bacteria, UA MANY (*)    All other components within normal limits  BASIC METABOLIC PANEL - Abnormal; Notable for the following:    CO2 33 (*)    Glucose, Bld 170 (*)    All other components within normal limits  CBC - Abnormal; Notable for the following:    RBC 3.60 (*)    Hemoglobin 10.4 (*)    HCT 30.9 (*)    RDW 16.2 (*)    All other components within normal limits  GLUCOSE, CAPILLARY - Abnormal; Notable for the following:    Glucose-Capillary 130 (*)    All other components within normal limits  URINE CULTURE    RADIOLOGY Images were viewed by me  CT renal stone study IMPRESSION: 1. Nonobstructing right nephrolithiasis. No hydronephrosis or evidence of obstructive uropathy. Previous left renal cyst is not well characterized. If an additional etiology for hematuria is suspected, recommend nonemergent evaluation with and without contrast per hematuria protocol. Urinary bladder is suboptimally assessed secondary to streak artifact from bilateral hip arthroplasties. 2. Equivocal gastric wall thickening involving the fundus. 3. Abdominal aortic atherosclerosis. Unchanged left adrenal adenoma and nodularity.  ____________________________________________  FINAL ASSESSMENT AND PLAN  Hematuria, Cystitis  Plan: Patient with labs and imaging as dictated above. Patient presents to ER with cystitis clinically. This may also be affected by her taking Xarelto. We will have her stop her Xarelto until hematuria resolves. She has been given Rocephin here, she'll be discharged with Septra DS. She  is stable for outpatient follow-up.   Earleen Newport, MD   Note: This dictation was prepared with Dragon dictation. Any transcriptional errors that result from this process are unintentional    Earleen Newport, MD 01/15/16 [phone]

## 2016-01-15 NOTE — ED Triage Notes (Signed)
Patient from home via POV with complaint of intermittent hematuria. Patient states that it "looks like watermelon".  Patient has hx of same with resulting nephrolithiasis. Patient states burning with urination.

## 2016-01-15 NOTE — ED Notes (Addendum)
Pt reports she has blood in her urine x 2-3 days  - Pt states it burns with urination but that she is not having frequency with urination - pt states she has a history of kidney stones but at this time has no pain - pt had fx hip approx 9 weeks ago and was in the hospital 3 weeks ago for pneumonia

## 2016-01-17 ENCOUNTER — Telehealth: Payer: Self-pay

## 2016-01-17 DIAGNOSIS — I5032 Chronic diastolic (congestive) heart failure: Secondary | ICD-10-CM | POA: Diagnosis not present

## 2016-01-17 DIAGNOSIS — S72001D Fracture of unspecified part of neck of right femur, subsequent encounter for closed fracture with routine healing: Secondary | ICD-10-CM | POA: Diagnosis not present

## 2016-01-17 DIAGNOSIS — F0390 Unspecified dementia without behavioral disturbance: Secondary | ICD-10-CM | POA: Diagnosis not present

## 2016-01-17 DIAGNOSIS — L89890 Pressure ulcer of other site, unstageable: Secondary | ICD-10-CM | POA: Diagnosis not present

## 2016-01-17 DIAGNOSIS — L89610 Pressure ulcer of right heel, unstageable: Secondary | ICD-10-CM | POA: Diagnosis not present

## 2016-01-17 DIAGNOSIS — L89620 Pressure ulcer of left heel, unstageable: Secondary | ICD-10-CM | POA: Diagnosis not present

## 2016-01-17 NOTE — Telephone Encounter (Signed)
Okay to authorize OT, PT, ST

## 2016-01-17 NOTE — Telephone Encounter (Signed)
Can call [facility] and authorize them to collect and UA and culture.

## 2016-01-17 NOTE — Telephone Encounter (Signed)
[name] called from [facility] Speech therapy called for verbal to evaluate and treat for SPEECH.

## 2016-01-17 NOTE — Telephone Encounter (Signed)
Left message on Wed apparently that patient was having Dark Urine like blood but it is not blood is what nurse said. She also said this was [place] afternoon and she was not sure if she can bring a urine sample or if we can fax an order to her to have urine done. I tried to call and tell that we need appt but got no answer.

## 2016-01-18 ENCOUNTER — Other Ambulatory Visit: Payer: Self-pay | Admitting: Internal Medicine

## 2016-01-18 DIAGNOSIS — I5032 Chronic diastolic (congestive) heart failure: Secondary | ICD-10-CM | POA: Diagnosis not present

## 2016-01-18 DIAGNOSIS — L89890 Pressure ulcer of other site, unstageable: Secondary | ICD-10-CM | POA: Diagnosis not present

## 2016-01-18 DIAGNOSIS — S72001D Fracture of unspecified part of neck of right femur, subsequent encounter for closed fracture with routine healing: Secondary | ICD-10-CM | POA: Diagnosis not present

## 2016-01-18 DIAGNOSIS — F0390 Unspecified dementia without behavioral disturbance: Secondary | ICD-10-CM | POA: Diagnosis not present

## 2016-01-18 DIAGNOSIS — L89610 Pressure ulcer of right heel, unstageable: Secondary | ICD-10-CM | POA: Diagnosis not present

## 2016-01-18 DIAGNOSIS — L89620 Pressure ulcer of left heel, unstageable: Secondary | ICD-10-CM | POA: Diagnosis not present

## 2016-01-18 LAB — URINE CULTURE: Culture: >=100000 — AB

## 2016-01-19 ENCOUNTER — Telehealth: Payer: Self-pay

## 2016-01-19 DIAGNOSIS — F0390 Unspecified dementia without behavioral disturbance: Secondary | ICD-10-CM | POA: Diagnosis not present

## 2016-01-19 DIAGNOSIS — L89610 Pressure ulcer of right heel, unstageable: Secondary | ICD-10-CM | POA: Diagnosis not present

## 2016-01-19 DIAGNOSIS — S72001D Fracture of unspecified part of neck of right femur, subsequent encounter for closed fracture with routine healing: Secondary | ICD-10-CM | POA: Diagnosis not present

## 2016-01-19 DIAGNOSIS — L89620 Pressure ulcer of left heel, unstageable: Secondary | ICD-10-CM | POA: Diagnosis not present

## 2016-01-19 DIAGNOSIS — I5032 Chronic diastolic (congestive) heart failure: Secondary | ICD-10-CM | POA: Diagnosis not present

## 2016-01-19 DIAGNOSIS — L89890 Pressure ulcer of other site, unstageable: Secondary | ICD-10-CM | POA: Diagnosis not present

## 2016-01-19 NOTE — Telephone Encounter (Signed)
Patient was seen in ER at [facility] on 8/5 for blood in urine and was told to stop taking her xarelto. Patient is unsure if she should continue her Amiodarone & Metoprolol or not. She stated she is still having some nausea. The doctor told her to contact her primary care doctor about the two medications in question. Please Advise.

## 2016-01-19 NOTE — Telephone Encounter (Signed)
Please see note before this

## 2016-01-19 NOTE — Telephone Encounter (Signed)
Continue both medications.

## 2016-01-19 NOTE — Telephone Encounter (Signed)
Pt called stating she was in the ER this past weekend and ER doctor advised her to stop her amiodarone. Pt wanted to know if she should restart her medication. Advised pt to call the physician who started her on the medication, as Larene Beach did not. Pt voiced understanding.

## 2016-01-20 ENCOUNTER — Other Ambulatory Visit: Payer: Self-pay

## 2016-01-20 ENCOUNTER — Encounter: Payer: Medicare Other | Admitting: Surgery

## 2016-01-20 DIAGNOSIS — J449 Chronic obstructive pulmonary disease, unspecified: Secondary | ICD-10-CM | POA: Diagnosis not present

## 2016-01-20 DIAGNOSIS — L97211 Non-pressure chronic ulcer of right calf limited to breakdown of skin: Secondary | ICD-10-CM | POA: Diagnosis not present

## 2016-01-20 DIAGNOSIS — Z7901 Long term (current) use of anticoagulants: Secondary | ICD-10-CM | POA: Diagnosis not present

## 2016-01-20 DIAGNOSIS — L89613 Pressure ulcer of right heel, stage 3: Secondary | ICD-10-CM | POA: Diagnosis not present

## 2016-01-20 DIAGNOSIS — L97212 Non-pressure chronic ulcer of right calf with fat layer exposed: Secondary | ICD-10-CM | POA: Diagnosis not present

## 2016-01-20 DIAGNOSIS — E11621 Type 2 diabetes mellitus with foot ulcer: Secondary | ICD-10-CM | POA: Diagnosis not present

## 2016-01-20 MED ORDER — JANUVIA 100 MG PO TABS: 50.0000 mg | ORAL_TABLET | Freq: Every day | ORAL | 1 refills | Status: DC

## 2016-01-20 MED ORDER — AMIODARONE HCL 200 MG PO TABS: 200.0000 mg | ORAL_TABLET | Freq: Every day | ORAL | 1 refills | Status: DC

## 2016-01-21 DIAGNOSIS — L89890 Pressure ulcer of other site, unstageable: Secondary | ICD-10-CM | POA: Diagnosis not present

## 2016-01-21 DIAGNOSIS — L89610 Pressure ulcer of right heel, unstageable: Secondary | ICD-10-CM | POA: Diagnosis not present

## 2016-01-21 DIAGNOSIS — F0390 Unspecified dementia without behavioral disturbance: Secondary | ICD-10-CM | POA: Diagnosis not present

## 2016-01-21 DIAGNOSIS — L89620 Pressure ulcer of left heel, unstageable: Secondary | ICD-10-CM | POA: Diagnosis not present

## 2016-01-21 DIAGNOSIS — I5032 Chronic diastolic (congestive) heart failure: Secondary | ICD-10-CM | POA: Diagnosis not present

## 2016-01-21 DIAGNOSIS — S72001D Fracture of unspecified part of neck of right femur, subsequent encounter for closed fracture with routine healing: Secondary | ICD-10-CM | POA: Diagnosis not present

## 2016-01-21 NOTE — Progress Notes (Signed)
[name], [name] ([id number]) Visit Report for 01/20/2016 Arrival Information [name] Patient Name: [name]. Date of Service: 01/20/2016 2:15 PM Medical Record Number: [id number] Patient Account Number: [id number] Date of Birth/Sex: [birth year] (80 Santanao. Female) Treating RN: [name] Primary Care Physician: [name] Other Clinician: Referring Physician: Halina [name] Treating Physician/Extender: [name] in Treatment: 7 Visit Information History Since Last Visit Added or deleted any medications: No Patient Arrived: Wheel Chair Any new allergies or adverse reactions: No Arrival Time: 14:16 Had a fall or experienced change in No Accompanied By: staff and son activities of daily living that may affect Transfer Assistance: Manual risk of falls: Patient Identification Verified: Yes Signs or symptoms of abuse/neglect since last No Secondary Verification Process Yes visito Completed: Hospitalized since last visit: No Patient Requires Transmission- No Pain Present Now: No Based Precautions: Patient Has Alerts: Yes Patient Alerts: Patient on Blood Thinner Electronic Signature(s) Signed: 01/20/2016 4:38:29 PM By: [name] Entered By: [name] on 01/20/2016 14:16:53 [name] ([id number]) -------------------------------------------------------------------------------- Encounter Discharge Information [name] Patient Name: [name] Date of Service: 01/20/2016 2:15 PM Medical Record Number: [id number] Patient Account Number: [id number] Date of Birth/Sex: December 22, 1935 (80 Santanao. Female) Treating RN: [name] Primary Care Physician: [name] Other Clinician: Referring Physician: Halina [name] Treating Physician/Extender: [name] in Treatment: 7 Encounter Discharge Information Items Discharge Pain Level: 0 Discharge Condition: Stable Ambulatory Status: Wheelchair Discharge Destination:  Home Transportation: Private Auto Accompanied By: son and staff Schedule Follow-up Appointment: Yes Medication Reconciliation completed and provided to Patient/Care No [name]: Provided on Clinical Summary of Care: 01/20/2016 Form Type Recipient Paper Patient LK Electronic Signature(s) Signed: 01/20/2016 2:56:11 PM By: [name] Entered By: [name] on 01/20/2016 14:56:11 [name] ([id number]) -------------------------------------------------------------------------------- Multi Wound Chart [name] Patient Name: [name] Date of Service: 01/20/2016 2:15 PM Medical Record Number: [id number] Patient Account Number: [id number] Date of Birth/Sex: [birth year] (80 Santanao. Female) Treating RN: Madison Gouty, RN, BSN, [name] Primary Care Physician: [name] Other Clinician: Referring Physician: Halina [name] Treating Physician/Extender: [name] in Treatment: 7 Vital Signs Height(in): 64 Pulse(bpm): 57 Weight(lbs): 110 Blood Pressure 117/47 (mmHg): Body Mass Index(BMI): 19 Temperature(F): 98.1 Respiratory Rate 18 (breaths/min): Photos: [N/A:N/A] Wound Location: Right, Medial Lower Leg Right Calcaneus - N/A Posterior Wounding Event: Gradually Appeared Gradually Appeared N/A Primary Etiology: Diabetic Wound/Ulcer of Diabetic Wound/Ulcer of N/A the Lower Extremity the Lower Extremity Comorbid History: Chronic Obstructive Chronic Obstructive N/A Pulmonary Disease Pulmonary Disease (COPD), Angina, (COPD), Angina, Congestive Heart Failure, Congestive Heart Failure, Type II Diabetes, Lupus Type II Diabetes, Lupus Erythematosus, Erythematosus, Osteoarthritis, Dementia Osteoarthritis, Dementia Date Acquired: 11/03/2015 11/03/2015 N/A Weeks of Treatment: 7 7 N/A Wound Status: Open Open N/A Measurements L x W x D 0.3x0.2x0.1 2.1x1.7x0.1 N/A (cm) Area (cm) : 0.047 2.804 N/A Volume (cm) : 0.005 0.28 N/A % Reduction in Area: 98.10% 70.60% N/A %  Reduction in Volume: 98.00% 70.70% N/A Classification: Grade 1 Grade 1 N/A Exudate Amount: None Present Large N/A [name], [name] ([id number]) Exudate Type: N/A Serosanguineous N/A Exudate Color: N/A red, brown N/A Foul Odor After No Yes N/A Cleansing: Odor Anticipated Due to N/A No N/A Product Use: Wound Margin: Distinct, outline attached Distinct, outline attached N/A Granulation Amount: Large (67-100%) Large (67-100%) N/A Granulation Quality: Pink, Pale Pink, Pale N/A Necrotic Amount: Small (1-33%) Small (1-33%) N/A Exposed Structures: Fascia: No Fascia: No N/A Fat: No Fat: No Tendon: No Tendon: No Muscle: No Muscle: No Joint: No Joint: No Bone: No  Bone: No Limited to Skin Limited to Skin Breakdown Breakdown Epithelialization: Small (1-33%) Small (1-33%) N/A Periwound Skin Texture: Edema: Yes Edema: Yes N/A Excoriation: No Excoriation: No Induration: No Induration: No Callus: No Callus: No Crepitus: No Crepitus: No Fluctuance: No Fluctuance: No Friable: No Friable: No Rash: No Rash: No Scarring: No Scarring: No Periwound Skin Moist: Yes Maceration: Yes N/A Moisture: Maceration: No Moist: Yes Dry/Scaly: No Dry/Scaly: No Periwound Skin Color: Atrophie Blanche: No Mottled: Yes N/A Cyanosis: No Atrophie Blanche: No Ecchymosis: No Cyanosis: No Erythema: No Ecchymosis: No Hemosiderin Staining: No Erythema: No Mottled: No Hemosiderin Staining: No Pallor: No Pallor: No Rubor: No Rubor: No Temperature: No Abnormality No Abnormality N/A Tenderness on Yes Yes N/A Palpation: Wound Preparation: Ulcer Cleansing: Ulcer Cleansing: N/A Rinsed/Irrigated with Rinsed/Irrigated with Saline Saline Topical Anesthetic Topical Anesthetic Applied: Other: lidocaine Applied: Other: lidocaine 4% 4% [name], [name] ([id number]) Treatment Notes Electronic Signature(s) Signed: 01/20/2016 4:59:27 PM By: [name] BSN, RN Entered By: [name] on 01/20/2016  14:37:18 [name] ([id number]) -------------------------------------------------------------------------------- [name] Patient Name: [name] Date of Service: 01/20/2016 2:15 PM Medical Record Number: [id number] Patient Account Number: [id number] Date of Birth/Sex: 1935/08/27 (80 Santanao. Female) Treating RN: Madison Gouty, RN, BSN, [name] Primary Care Physician: [name] Other Clinician: Referring Physician: Halina [name] Treating Physician/Extender: [name] in Treatment: 7 Active Inactive Abuse / Safety / Falls / Self Care Management Nursing Diagnoses: Potential for falls Goals: Patient/caregiver will verbalize understanding of skin care regimen Date Initiated: 12/02/2015 Goal Status: Active Patient/caregiver will verbalize/demonstrate measures taken to prevent injury and/or falls Date Initiated: 12/02/2015 Goal Status: Active Interventions: Assess fall risk on admission and as needed Assess: immobility, friction, shearing, incontinence upon admission and as needed Assess impairment of mobility on admission and as needed per policy Assess self care needs on admission and as needed Provide education on fall prevention Provide education on safe transfers Notes: Wound/Skin Impairment Nursing Diagnoses: Impaired tissue integrity Goals: Patient/caregiver will verbalize understanding of skin care regimen Date Initiated: 12/02/2015 Goal Status: Active Interventions: Assess patient/caregiver ability to obtain necessary supplies Assess patient/caregiver ability to perform ulcer/skin care regimen upon admission and as needed [name], [name] ([id number]) Assess ulceration(s) every visit Provide education on ulcer and skin care Notes: Electronic Signature(s) Signed: 01/20/2016 4:59:27 PM By: [name] BSN, RN Entered By: [name] on 01/20/2016 14:36:04 [name]  ([id number]) -------------------------------------------------------------------------------- Pain Assessment [name] Patient Name: [name] Date of Service: 01/20/2016 2:15 PM Medical Record Number: [id number] Patient Account Number: [id number] Date of Birth/Sex: April 29, 1936 (80 Santanao. Female) Treating RN: [name] Primary Care Physician: [name] Other Clinician: Referring Physician: Halina [name] Treating Physician/Extender: [name] in Treatment: 7 Active Problems Location of Pain Severity and Description of Pain Patient Has Paino No Site Locations Pain Management and Medication Current Pain Management: Notes Topical or injectable lidocaine is offered to patient for acute pain when surgical debridement is performed. If needed, Patient is instructed to use over the counter pain medication for the following 24-48 hours after debridement. Wound care MDs do not prescribed pain medications. Patient has chronic pain or uncontrolled pain. Patient has been instructed to make an appointment with their Primary Care Physician for pain management. Electronic Signature(s) Signed: 01/20/2016 4:38:29 PM By: [name] Entered By: [name] on 01/20/2016 14:17:01 [name] ([id number]) -------------------------------------------------------------------------------- Patient/Caregiver Education [name] Patient Name: [name] Date of Service: 01/20/2016 2:15 PM Medical Record Number: [id number] Patient Account Number: [id number] Date  of Birth/Gender: 1936/05/21 (80 Santanao. Female) Treating RN: Curtis Sitesorthy, Joanna Primary Care Physician: Bari EdwardBerglund, Laura Other Clinician: Referring Physician: Bari EdwardBerglund, Laura Treating Physician/Extender: Rudene ReBritto, Errol Weeks in Treatment: 7 Education Assessment Education Provided To: Patient and Caregiver Education Topics Provided Wound/Skin Impairment: Handouts: Other: wound care to continue as ordered Methods:  Demonstration, Explain/Verbal Responses: State content correctly Electronic Signature(s) Signed: 01/20/2016 4:38:29 PM By: Curtis Sitesorthy, Joanna Entered By: Curtis Sitesorthy, Joanna on 01/20/2016 14:56:00 Madison PotterKEATON, Madison Y. ([id number]) -------------------------------------------------------------------------------- Wound Assessment [name] Patient Name: Madison PotterKEATON, Madison Y. Date of Service: 01/20/2016 2:15 PM Medical Record Number: [id number] Patient Account Number: [id number] Date of Birth/Sex: 1936/05/21 (80 Santanao. Female) Treating RN: Afful, RN, BSN, Rita Primary Care Physician: Bari EdwardBerglund, Laura Other Clinician: Referring Physician: Bari EdwardBerglund, Laura Treating Physician/Extender: Rudene ReBritto, Errol Weeks in Treatment: 7 Wound Status Wound Number: 4 Primary Diabetic Wound/Ulcer of the Lower Etiology: Extremity Wound Location: Right, Medial Lower Leg Wound Open Wounding Event: Gradually Appeared Status: Date Acquired: 11/03/2015 Comorbid Chronic Obstructive Pulmonary Disease Weeks Of Treatment: 7 History: (COPD), Angina, Congestive Heart Clustered Wound: No Failure, Type II Diabetes, Lupus Erythematosus, Osteoarthritis, Dementia Photos Wound Measurements Length: (cm) 0.3 Width: (cm) 0.2 Depth: (cm) 0.1 Area: (cm) 0.047 Volume: (cm) 0.005 % Reduction in Area: 98.1% % Reduction in Volume: 98% Epithelialization: Small (1-33%) Tunneling: No Undermining: No Wound Description Classification: Grade 1 Wound Margin: Distinct, outline attached Exudate Amount: None Present Foul Odor After Cleansing: No Wound Bed Granulation Amount: Large (67-100%) Exposed Structure Granulation Quality: Pink, Pale Fascia Exposed: No Necrotic Amount: Small (1-33%) Fat Layer Exposed: No Necrotic Quality: Adherent Slough Tendon Exposed: No Muscle Exposed: No Joint Exposed: No Madison PotterKEATON, Madison Y. ([id number]) Bone Exposed: No Limited to Skin Breakdown Periwound Skin Texture Texture Color No Abnormalities Noted: No No  Abnormalities Noted: No Callus: No Atrophie Blanche: No Crepitus: No Cyanosis: No Excoriation: No Ecchymosis: No Fluctuance: No Erythema: No Friable: No Hemosiderin Staining: No Induration: No Mottled: No Localized Edema: Yes Pallor: No Rash: No Rubor: No Scarring: No Temperature / Pain Moisture Temperature: No Abnormality No Abnormalities Noted: No Tenderness on Palpation: Yes Dry / Scaly: No Maceration: No Moist: Yes Wound Preparation Ulcer Cleansing: Rinsed/Irrigated with Saline Topical Anesthetic Applied: Other: lidocaine 4%, Treatment Notes Wound #4 (Right, Medial Lower Leg) 1. Cleansed with: Clean wound with Normal Saline 2. Anesthetic Topical Lidocaine 4% cream to wound bed prior to debridement 4. Dressing Applied: Prisma Ag 5. Secondary Dressing Applied Gauze and Kerlix/Conform 7. Secured with Secretary/administratorTape Electronic Signature(s) Signed: 01/20/2016 4:59:27 PM By: Elpidio EricAfful, Rita BSN, RN Entered By: Elpidio EricAfful, Rita on 01/20/2016 14:36:56 Madison PotterKEATON, Jeanita Y. ([id number]) -------------------------------------------------------------------------------- Wound Assessment [name] Patient Name: Madison PotterKEATON, Madison Y. Date of Service: 01/20/2016 2:15 PM Medical Record Number: [id number] Patient Account Number: [id number] Date of Birth/Sex: 1936/05/21 (80 Santanao. Female) Treating RN: Curtis Sitesorthy, Joanna Primary Care Physician: Bari EdwardBerglund, Laura Other Clinician: Referring Physician: Bari EdwardBerglund, Laura Treating Physician/Extender: Rudene ReBritto, Errol Weeks in Treatment: 7 Wound Status Wound Number: 5 Primary Diabetic Wound/Ulcer of the Lower Etiology: Extremity Wound Location: Right Calcaneus - Posterior Wound Open Wounding Event: Gradually Appeared Status: Date Acquired: 11/03/2015 Comorbid Chronic Obstructive Pulmonary Disease Weeks Of Treatment: 7 History: (COPD), Angina, Congestive Heart Clustered Wound: No Failure, Type II Diabetes, Lupus Erythematosus, Osteoarthritis, Dementia Photos Wound  Measurements Length: (cm) 2.1 Width: (cm) 1.7 Depth: (cm) 0.1 Area: (cm) 2.804 Volume: (cm) 0.28 % Reduction in Area: 70.6% % Reduction in Volume: 70.7% Epithelialization: Small (1-33%) Tunneling: No Undermining: No Wound Description Classification: Grade 1 Wound Margin: Distinct, outline attached Exudate Amount: Large Exudate Type: Serosanguineous  Exudate Color: red, brown Foul Odor After Cleansing: Yes Due to Product Use: No Wound Bed Granulation Amount: Large (67-100%) Exposed Structure Granulation Quality: Pink, Pale Fascia Exposed: No Necrotic Amount: Small (1-33%) Fat Layer Exposed: No Necrotic Quality: Adherent Slough Tendon Exposed: No [name], [name] ([id number]) Muscle Exposed: No Joint Exposed: No Bone Exposed: No Limited to Skin Breakdown Periwound Skin Texture Texture Color No Abnormalities Noted: No No Abnormalities Noted: No Callus: No Atrophie Blanche: No Crepitus: No Cyanosis: No Excoriation: No Ecchymosis: No Fluctuance: No Erythema: No Friable: No Hemosiderin Staining: No Induration: No Mottled: Yes Localized Edema: Yes Pallor: No Rash: No Rubor: No Scarring: No Temperature / Pain Moisture Temperature: No Abnormality No Abnormalities Noted: No Tenderness on Palpation: Yes Dry / Scaly: No Maceration: Yes Moist: Yes Wound Preparation Ulcer Cleansing: Rinsed/Irrigated with Saline Topical Anesthetic Applied: Other: lidocaine 4%, Treatment Notes Wound #5 (Right, Posterior Calcaneus) 1. Cleansed with: Clean wound with Normal Saline 2. Anesthetic Topical Lidocaine 4% cream to wound bed prior to debridement 4. Dressing Applied: Santyl Ointment 5. Secondary Dressing Applied Gauze and Kerlix/Conform 7. Secured with Recruitment consultant) Signed: 01/20/2016 4:38:29 PM By: [name] Entered By: [name] on 01/20/2016 14:29:08 [name]  ([id number]) -------------------------------------------------------------------------------- Kake [name] Patient Name: [name] Date of Service: 01/20/2016 2:15 PM Medical Record Number: [id number] Patient Account Number: [id number] Date of Birth/Sex: [birth year] (80 Santanao. Female) Treating RN: [name] Primary Care Physician: [name] Other Clinician: Referring Physician: Halina [name] Treating Physician/Extender: [name] in Treatment: 7 Vital Signs Time Taken: 14:17 Temperature (F): 98.1 Height (in): 64 Pulse (bpm): 57 Weight (lbs): 110 Respiratory Rate (breaths/min): 18 Body Mass Index (BMI): 18.9 Blood Pressure (mmHg): 117/47 Reference Range: 80 - 120 mg / dl Electronic Signature(s) Signed: 01/20/2016 4:38:29 PM By: [name] Entered By: [name] on 01/20/2016 14:20:25

## 2016-01-21 NOTE — Progress Notes (Signed)
KEYMONI, MARAGOS ([id number]) Visit Report for 01/20/2016 Chief Complaint Document Details Patient Name: [name], [name] 01/20/2016 2:15 Date of Service: PM Medical Record [id number] Number: Patient Account Number: [id number] [birth year] (80 y.o. Treating [name]: [name] Date of Birth/Sex: Female) Other Clinician: Primary Care Physician: Halina Maidens Treating [name] Referring Physician: Halina Maidens Physician/Extender: Suella Grove in Treatment: 7 Information Obtained from: Patient Chief Complaint Patient is at the clinic for treatment of an open pressure ulcer to her right calcaneum which has been there for about 4 weeks Electronic Signature(s) Signed: 01/20/2016 2:56:10 PM By: [name], FACS Entered By: [name] on 01/20/2016 14:56:10 [name] ([id number]) -------------------------------------------------------------------------------- Debridement Details Patient Name: [name] 01/20/2016 2:15 Date of Service: PM Medical Record [id number] Number: Patient Account Number: [id number] 1935-11-15 (80 y.o. Treating [name]: [name] Date of Birth/Sex: Female) Other Clinician: Primary Care Physician: Halina Maidens Treating [name] Referring Physician: Halina Maidens Physician/Extender: Suella Grove in Treatment: 7 Debridement Performed for Wound #5 Right,Posterior Calcaneus Assessment: Performed By: Physician [name], MD Debridement: Debridement Pre-procedure Yes Verification/Time Out Taken: Start Time: 14:39 Pain Control: Lidocaine 4% Topical Solution Level: Skin/Subcutaneous Tissue Total Area Debrided (L x 2.1 (cm) x 1.7 (cm) = 3.57 (cm) W): Tissue and other Non-Viable, Fibrin/Slough, Subcutaneous material debrided: Instrument: Curette Bleeding: Minimum Hemostasis Achieved: Pressure End Time: 14:43 Procedural Pain: 0 Post Procedural Pain: 0 Response to Treatment: Procedure was tolerated well Post Debridement  Measurements of Total Wound Length: (cm) 2.1 Width: (cm) 1.7 Depth: (cm) 0.2 Volume: (cm) 0.561 Post Procedure Diagnosis Same as Pre-procedure Electronic Signature(s) Signed: 01/20/2016 2:56:01 PM By: [name], FACS Signed: 01/20/2016 4:38:29 PM By: [name] Entered By: [name] on 01/20/2016 14:56:01 [name] ([id number]) -------------------------------------------------------------------------------- HPI Details Patient Name: [name], [name] 01/20/2016 2:15 Date of Service: PM Medical Record [id number] Number: Patient Account Number: [id number] 1936/03/26 (80 y.o. Treating [name]: [name] Date of Birth/Sex: Female) Other Clinician: Primary Care Physician: Halina Maidens Treating [name] Referring Physician: Halina Maidens Physician/Extender: Suella Grove in Treatment: 7 History of Present Illness Location: right posterior heel, and both lower extremities Quality: Patient reports experiencing a sharp pain to affected area(s). Severity: Patient states wound (s) are getting better. Duration: Patient has had the wound for < 4 weeks prior to presenting for treatment Timing: Pain in wound is Intermittent (comes and goes Context: The wound appeared gradually over time while she was in hospital with a right hip fracture Modifying Factors: Consults to this date include:was seen by her PCP and put on some antibiotics a while ago Associated Signs and Symptoms: Patient reports having difficulty standing for long periods. HPI Description: 80 year old patient referred who was recently discharged in April of this year with a left heel ulcer now comes with a right heel ulcer and some ulcerations in both lower extremities which have all come along during her recent hospitalization which she had a right hip fracture. She has a past medical history of COPD, diabetes mellitus type 2 with chronic kidney disease, multi-infarct dementia, anxiety, urinary retention,  paroxysmal atrial fibrillation. She has also had moderate malnutrition, multi-infarct dementia, congestive heart failure, left displaced femoral neck fracture, systemic lupus erythematosus. She is also status total abdominal hysterectomy with bilateral salpingo-oophorectomy, EGDs, hip arthroplasty on the left, cataract surgery. She has been a from a smoker and quit in May 2013 and she smoked for about 40 years. recently admitted to [facility] between January 19 and 07/03/2015 12/09/2015 -- o right foot x-ray --  IMPRESSION:Soft tissue ulceration of the heel. No underlying erosive bony abnormality. Diffuse degenerative change. No acute bony abnormality. 12/30/2015-- admitted to the hospital between July 12 and July 18 of 2017 and was treated for cerebral infarction, infected decubitus ulcer and aspiration pneumonia of the right lung. MRI was negative for stroke and was thought to be secondary to pneumonia. She was treated with clindamycin. Patient was discharged home on Keflex and Zyvox. Dr. Lucky Cowboy took her for an aortogram and a right lower extremity runoff and found to 30% stenosis in the mid to distal SFA but no significant stenosis in the popliteal artery and the SFA. There was a two-vessel runoff through the anterior tibial artery and peroneal artery without significant stenosis. felt her perfusion was adequate for wound healing. Electronic Signature(s) Signed: 01/20/2016 2:56:16 PM By: [name], FACS Entered By: [name] on 01/20/2016 14:56:16 [name] ([name] ([id number]) -------------------------------------------------------------------------------- Physical Exam Details Patient Name: [name], [name] 01/20/2016 2:15 Date of Service: PM Medical Record [id number] Number: Patient Account Number: [id number] [birth year] (80 y.o. Treating [name]: [name] Date of Birth/Sex: Female) Other Clinician: Primary Care Physician:  Halina Maidens Treating [name] Referring Physician: Halina Maidens Physician/Extender: Suella Grove in Treatment: 7 Constitutional . Pulse regular. Respirations normal and unlabored. Afebrile. . Eyes Nonicteric. Reactive to light. Ears, Nose, Mouth, and Throat Lips, teeth, and gums WNL.Marland Kitchen Moist mucosa without lesions. Neck supple and nontender. No palpable supraclavicular or cervical adenopathy. Normal sized without goiter. Respiratory WNL. No retractions.. Cardiovascular Pedal Pulses WNL. No clubbing, cyanosis or edema. Lymphatic No adneopathy. No adenopathy. No adenopathy. Musculoskeletal Adexa without tenderness or enlargement.. Digits and nails w/o clubbing, cyanosis, infection, petechiae, ischemia, or inflammatory conditions.. Integumentary (Hair, Skin) No suspicious lesions. No crepitus or fluctuance. No peri-wound warmth or erythema. No masses.Marland Kitchen Psychiatric Judgement and insight Intact.. No evidence of depression, anxiety, or agitation.. Notes the right heel wound continues to have need for sharp debridement with a curette and excessive amount of slough was removed and after this the base of the wound looks clean. Bleeding controlled with pressure. Electronic Signature(s) Signed: 01/20/2016 2:57:02 PM By: [name], FACS Entered By: [name] on 01/20/2016 14:57:02 [name] ([id number]) -------------------------------------------------------------------------------- Physician Orders Details Patient Name: [name] Date of Service: 01/20/2016 2:15 PM Medical Record Patient Account Number: [id number] Number: Afful, [name], [name], Treating [name]: 04/21/1936 (80 y.o. [name] Date of Birth/Sex: Female) Other Clinician: Primary Care Physician: Halina Maidens Treating [name] Referring Physician: Halina Maidens Physician/Extender: Suella Grove in Treatment: 7 Verbal / Phone Orders: Yes Clinician: Afful, [name], [name], Rita Read Back and Verified:  Yes Diagnosis Coding Wound Cleansing Wound #4 Right,Medial Lower Leg o Clean wound with Normal Saline. - in clinic o Cleanse wound with mild soap and water Wound #5 Right,Posterior Calcaneus o Clean wound with Normal Saline. - in clinic o Cleanse wound with mild soap and water Anesthetic Wound #4 Right,Medial Lower Leg o Topical Lidocaine 4% cream applied to wound bed prior to debridement Wound #5 Right,Posterior Calcaneus o Topical Lidocaine 4% cream applied to wound bed prior to debridement Primary Wound Dressing Wound #4 Right,Medial Lower Leg o Prisma Ag Wound #5 Right,Posterior Calcaneus o Santyl Ointment o Prisma Ag Secondary Dressing Wound #4 Right,Medial Lower Leg o Gauze and Kerlix/Conform Wound #5 Right,Posterior Calcaneus o Dry Gauze o Foam - foam heel cup Dressing Change Frequency Wound #4 Right,Medial Lower Leg [name], [name] ([id number]) o Change dressing every day. Wound #5 Right,Posterior Calcaneus o Change  dressing every day. Follow-up Appointments o Return Appointment in 1 week. Off-Loading o Other: - float heels with pillow under calves when in bed; wear pressure relieving boots while in bed Additional Orders / Instructions Wound #4 Right,Medial Lower Leg o Increase protein intake. o Activity as tolerated Wound #5 Right,Posterior Calcaneus o Increase protein intake. o Activity as tolerated Electronic Signature(s) Signed: 01/20/2016 4:56:45 PM By: [name], FACS Signed: 01/20/2016 4:59:27 PM By: Regan Lemming BSN, [name] Entered By: Regan Lemming on 01/20/2016 14:42:20 [name] ([id number]) -------------------------------------------------------------------------------- Problem List Details Patient Name: [name], [name] 01/20/2016 2:15 Date of Service: PM Medical Record [id number] Number: Patient Account Number: [id number] 1935-09-27 (80 y.o. Treating [name]: [name] Date of  Birth/Sex: Female) Other Clinician: Primary Care Physician: Halina Maidens Treating [name] Referring Physician: Halina Maidens Physician/Extender: Suella Grove in Treatment: 7 Active Problems ICD-10 Encounter Code Description Active Date Diagnosis E11.621 Type 2 diabetes mellitus with foot ulcer 12/02/2015 Yes L89.613 Pressure ulcer of right heel, stage 3 12/02/2015 Yes L97.212 Non-pressure chronic ulcer of right calf with fat layer 12/02/2015 Yes exposed L97.211 Non-pressure chronic ulcer of right calf limited to 12/02/2015 Yes breakdown of skin Z79.01 Long term (current) use of anticoagulants 12/02/2015 Yes Inactive Problems Resolved Problems Electronic Signature(s) Signed: 01/20/2016 2:55:46 PM By: [name], FACS Entered By: [name] on 01/20/2016 14:55:45 [name] ([id number]) -------------------------------------------------------------------------------- Progress Note Details Patient Name: [name] 01/20/2016 2:15 Date of Service: PM Medical Record [id number] Number: Patient Account Number: [id number] [birth year] (80 y.o. Treating [name]: [name] Date of Birth/Sex: Female) Other Clinician: Primary Care Physician: Halina Maidens Treating [name] Referring Physician: Halina Maidens Physician/Extender: Suella Grove in Treatment: 7 Subjective Chief Complaint Information obtained from Patient Patient is at the clinic for treatment of an open pressure ulcer to her right calcaneum which has been there for about 4 weeks History of Present Illness (HPI) The following HPI elements were documented for the patient's wound: Location: right posterior heel, and both lower extremities Quality: Patient reports experiencing a sharp pain to affected area(s). Severity: Patient states wound (s) are getting better. Duration: Patient has had the wound for < 4 weeks prior to presenting for treatment Timing: Pain in wound is Intermittent (comes and  goes Context: The wound appeared gradually over time while she was in hospital with a right hip fracture Modifying Factors: Consults to this date include:was seen by her PCP and put on some antibiotics a while ago Associated Signs and Symptoms: Patient reports having difficulty standing for long periods. 80 year old patient referred who was recently discharged in April of this year with a left heel ulcer now comes with a right heel ulcer and some ulcerations in both lower extremities which have all come along during her recent hospitalization which she had a right hip fracture. She has a past medical history of COPD, diabetes mellitus type 2 with chronic kidney disease, multi-infarct dementia, anxiety, urinary retention, paroxysmal atrial fibrillation. She has also had moderate malnutrition, multi-infarct dementia, congestive heart failure, left displaced femoral neck fracture, systemic lupus erythematosus. She is also status total abdominal hysterectomy with bilateral salpingo-oophorectomy, EGDs, hip arthroplasty on the left, cataract surgery. She has been a from a smoker and quit in May 2013 and she smoked for about 40 years. recently admitted to [facility] between January 19 and 07/03/2015 12/09/2015 -- right foot x-ray -- IMPRESSION:Soft tissue ulceration of the heel. No underlying erosive bony abnormality. Diffuse degenerative change. No acute bony abnormality. 12/30/2015-- admitted to  the hospital between July 12 and July 18 of 2017 and was treated for cerebral infarction, infected decubitus ulcer and aspiration pneumonia of the right lung. MRI was negative for stroke and was thought to be secondary to pneumonia. She was treated with clindamycin. Patient was discharged home on Keflex and Zyvox. [name] ([id number]) Dr. Lucky Cowboy took her for an aortogram and a right lower extremity runoff and found to 30% stenosis in the mid to distal SFA but no significant  stenosis in the popliteal artery and the SFA. There was a two-vessel runoff through the anterior tibial artery and peroneal artery without significant stenosis. felt her perfusion was adequate for wound healing. Objective Constitutional Pulse regular. Respirations normal and unlabored. Afebrile. Vitals Time Taken: 2:17 PM, Height: 64 in, Weight: 110 lbs, BMI: 18.9, Temperature: 98.1 F, Pulse: 57 bpm, Respiratory Rate: 18 breaths/min, Blood Pressure: 117/47 mmHg. Eyes Nonicteric. Reactive to light. Ears, Nose, Mouth, and Throat Lips, teeth, and gums WNL.Marland Kitchen Moist mucosa without lesions. Neck supple and nontender. No palpable supraclavicular or cervical adenopathy. Normal sized without goiter. Respiratory WNL. No retractions.. Cardiovascular Pedal Pulses WNL. No clubbing, cyanosis or edema. Lymphatic No adneopathy. No adenopathy. No adenopathy. Musculoskeletal Adexa without tenderness or enlargement.. Digits and nails w/o clubbing, cyanosis, infection, petechiae, ischemia, or inflammatory conditions.Marland Kitchen Psychiatric Judgement and insight Intact.. No evidence of depression, anxiety, or agitation.. General Notes: the right heel wound continues to have need for sharp debridement with a curette and excessive amount of slough was removed and after this the base of the wound looks clean. Bleeding controlled with pressure. Integumentary (Hair, Skin) [name], [name]. ([id number]) No suspicious lesions. No crepitus or fluctuance. No peri-wound warmth or erythema. No masses.. Wound #4 status is Open. Original cause of wound was Gradually Appeared. The wound is located on the Right,Medial Lower Leg. The wound measures 0.3cm length x 0.2cm width x 0.1cm depth; 0.047cm^2 area and 0.005cm^3 volume. The wound is limited to skin breakdown. There is no tunneling or undermining noted. There is a none present amount of drainage noted. The wound margin is distinct with the outline attached to the wound  base. There is large (67-100%) pink, pale granulation within the wound bed. There is a small (1-33%) amount of necrotic tissue within the wound bed including Adherent Slough. The periwound skin appearance exhibited: Localized Edema, Moist. The periwound skin appearance did not exhibit: Callus, Crepitus, Excoriation, Fluctuance, Friable, Induration, Rash, Scarring, Dry/Scaly, Maceration, Atrophie Blanche, Cyanosis, Ecchymosis, Hemosiderin Staining, Mottled, Pallor, Rubor, Erythema. Periwound temperature was noted as No Abnormality. The periwound has tenderness on palpation. Wound #5 status is Open. Original cause of wound was Gradually Appeared. The wound is located on the Right,Posterior Calcaneus. The wound measures 2.1cm length x 1.7cm width x 0.1cm depth; 2.804cm^2 area and 0.28cm^3 volume. The wound is limited to skin breakdown. There is no tunneling or undermining noted. There is a large amount of serosanguineous drainage noted. The wound margin is distinct with the outline attached to the wound base. There is large (67-100%) pink, pale granulation within the wound bed. There is a small (1-33%) amount of necrotic tissue within the wound bed including Adherent Slough. The periwound skin appearance exhibited: Localized Edema, Maceration, Moist, Mottled. The periwound skin appearance did not exhibit: Callus, Crepitus, Excoriation, Fluctuance, Friable, Induration, Rash, Scarring, Dry/Scaly, Atrophie Blanche, Cyanosis, Ecchymosis, Hemosiderin Staining, Pallor, Rubor, Erythema. Periwound temperature was noted as No Abnormality. The periwound has tenderness on palpation. Assessment Active Problems ICD-10 E11.621 - Type 2 diabetes mellitus with  foot ulcer L89.613 - Pressure ulcer of right heel, stage 3 L97.212 - Non-pressure chronic ulcer of right calf with fat layer exposed L97.211 - Non-pressure chronic ulcer of right calf limited to breakdown of skin Z79.01 - Long term (current) use of  anticoagulants Procedures Wound #5 Wound #5 is a Diabetic Wound/Ulcer of the Lower Extremity located on the Right,Posterior Calcaneus . There was a Skin/Subcutaneous Tissue Debridement [id number]) debridement with total area of 3.57 sq cm performed by [name], MD. with the following instrument(s): Curette to remove Non-Viable tissue/material including Fibrin/Slough and Subcutaneous after achieving pain control using Lidocaine 4% [name], [name] ([id number]) Topical Solution. A time out was conducted prior to the start of the procedure. A Minimum amount of bleeding was controlled with Pressure. The procedure was tolerated well with a pain level of 0 throughout and a pain level of 0 following the procedure. Post Debridement Measurements: 2.1cm length x 1.7cm width x 0.2cm depth; 0.561cm^3 volume. Post procedure Diagnosis Wound #5: Same as Pre-Procedure Plan Wound Cleansing: Wound #4 Right,Medial Lower Leg: Clean wound with Normal Saline. - in clinic Cleanse wound with mild soap and water Wound #5 Right,Posterior Calcaneus: Clean wound with Normal Saline. - in clinic Cleanse wound with mild soap and water Anesthetic: Wound #4 Right,Medial Lower Leg: Topical Lidocaine 4% cream applied to wound bed prior to debridement Wound #5 Right,Posterior Calcaneus: Topical Lidocaine 4% cream applied to wound bed prior to debridement Primary Wound Dressing: Wound #4 Right,Medial Lower Leg: Prisma Ag Wound #5 Right,Posterior Calcaneus: Santyl Ointment Prisma Ag Secondary Dressing: Wound #4 Right,Medial Lower Leg: Gauze and Kerlix/Conform Wound #5 Right,Posterior Calcaneus: Dry Gauze Foam - foam heel cup Dressing Change Frequency: Wound #4 Right,Medial Lower Leg: Change dressing every day. Wound #5 Right,Posterior Calcaneus: Change dressing every day. Follow-up Appointments: Return Appointment in 1 week. Off-Loading: Other: - float heels with pillow under calves when in bed; wear  pressure relieving boots while in bed Additional Orders / Instructions: Wound #4 Right,Medial Lower Leg: Increase protein intake. Activity as tolerated [name], [name] ([id number]) Wound #5 Right,Posterior Calcaneus: Increase protein intake. Activity as tolerated I have recommended: 1. Santyl ointment to the wounds on the right heel. 2. Prisma AG on the right lower extremity to be covered with a bordered foam. 3. Good control of her diabetes 4. Adequate nutrition with protein supplements, vitamin A, vitamin C and zinc 5. Regular visits to the wound center Electronic Signature(s) Signed: 01/20/2016 2:57:38 PM By: [name], FACS Entered By: [name] on 01/20/2016 14:57:37 [name] ([id number]) -------------------------------------------------------------------------------- SuperBill Details Patient Name: [name] Date of Service: 01/20/2016 Medical Record Number: [id number] Patient Account Number: [id number] Date of Birth/Sex: 08-23-1935 (80 y.o. Female) Treating [name]: [name] Primary Care Physician: Halina Maidens Other Clinician: Referring Physician: Halina Maidens Treating Physician/Extender: Frann Rider in Treatment: 7 Diagnosis Coding ICD-10 Codes Code Description E11.621 Type 2 diabetes mellitus with foot ulcer L89.613 Pressure ulcer of right heel, stage 3 L97.212 Non-pressure chronic ulcer of right calf with fat layer exposed L97.211 Non-pressure chronic ulcer of right calf limited to breakdown of skin Z79.01 Long term (current) use of anticoagulants Facility Procedures CPT4 Code: [id number] Description: 11042 - DEB SUBQ TISSUE 20 SQ CM/< ICD-10 Description Diagnosis E11.621 Type 2 diabetes mellitus with foot ulcer L89.613 Pressure ulcer of right heel, stage 3 Modifier: Quantity: 1 Physician Procedures CPT4 Code: [id number] Description: 11042 - WC PHYS SUBQ TISS 20 SQ CM ICD-10 Description Diagnosis E11.621 Type 2 diabetes  mellitus with  foot ulcer L89.613 Pressure ulcer of right heel, stage 3 Modifier: Quantity: 1 Electronic Signature(s) Signed: 01/20/2016 2:58:07 PM By: [name], FACS Entered By: [name] on 01/20/2016 14:58:06

## 2016-01-24 DIAGNOSIS — I5032 Chronic diastolic (congestive) heart failure: Secondary | ICD-10-CM | POA: Diagnosis not present

## 2016-01-24 DIAGNOSIS — L89610 Pressure ulcer of right heel, unstageable: Secondary | ICD-10-CM | POA: Diagnosis not present

## 2016-01-24 DIAGNOSIS — L89620 Pressure ulcer of left heel, unstageable: Secondary | ICD-10-CM | POA: Diagnosis not present

## 2016-01-24 DIAGNOSIS — L89890 Pressure ulcer of other site, unstageable: Secondary | ICD-10-CM | POA: Diagnosis not present

## 2016-01-24 DIAGNOSIS — F0390 Unspecified dementia without behavioral disturbance: Secondary | ICD-10-CM | POA: Diagnosis not present

## 2016-01-24 DIAGNOSIS — S72001D Fracture of unspecified part of neck of right femur, subsequent encounter for closed fracture with routine healing: Secondary | ICD-10-CM | POA: Diagnosis not present

## 2016-01-25 ENCOUNTER — Telehealth: Payer: Self-pay

## 2016-01-25 DIAGNOSIS — L89890 Pressure ulcer of other site, unstageable: Secondary | ICD-10-CM | POA: Diagnosis not present

## 2016-01-25 DIAGNOSIS — L89620 Pressure ulcer of left heel, unstageable: Secondary | ICD-10-CM | POA: Diagnosis not present

## 2016-01-25 DIAGNOSIS — F0390 Unspecified dementia without behavioral disturbance: Secondary | ICD-10-CM | POA: Diagnosis not present

## 2016-01-25 DIAGNOSIS — I5032 Chronic diastolic (congestive) heart failure: Secondary | ICD-10-CM | POA: Diagnosis not present

## 2016-01-25 DIAGNOSIS — L89610 Pressure ulcer of right heel, unstageable: Secondary | ICD-10-CM | POA: Diagnosis not present

## 2016-01-25 DIAGNOSIS — S72001D Fracture of unspecified part of neck of right femur, subsequent encounter for closed fracture with routine healing: Secondary | ICD-10-CM | POA: Diagnosis not present

## 2016-01-25 NOTE — Telephone Encounter (Signed)
Home Health called about patient being taken off of xarelto and I advised we are aware.

## 2016-01-26 DIAGNOSIS — I5032 Chronic diastolic (congestive) heart failure: Secondary | ICD-10-CM | POA: Diagnosis not present

## 2016-01-26 DIAGNOSIS — J449 Chronic obstructive pulmonary disease, unspecified: Secondary | ICD-10-CM | POA: Diagnosis not present

## 2016-01-26 DIAGNOSIS — Z87891 Personal history of nicotine dependence: Secondary | ICD-10-CM | POA: Diagnosis not present

## 2016-01-26 DIAGNOSIS — S72001D Fracture of unspecified part of neck of right femur, subsequent encounter for closed fracture with routine healing: Secondary | ICD-10-CM | POA: Diagnosis not present

## 2016-01-26 DIAGNOSIS — Z9981 Dependence on supplemental oxygen: Secondary | ICD-10-CM | POA: Diagnosis not present

## 2016-01-26 DIAGNOSIS — M329 Systemic lupus erythematosus, unspecified: Secondary | ICD-10-CM | POA: Diagnosis not present

## 2016-01-26 DIAGNOSIS — F0390 Unspecified dementia without behavioral disturbance: Secondary | ICD-10-CM | POA: Diagnosis not present

## 2016-01-26 DIAGNOSIS — L89893 Pressure ulcer of other site, stage 3: Secondary | ICD-10-CM | POA: Diagnosis not present

## 2016-01-26 DIAGNOSIS — E1142 Type 2 diabetes mellitus with diabetic polyneuropathy: Secondary | ICD-10-CM | POA: Diagnosis not present

## 2016-01-26 DIAGNOSIS — L89614 Pressure ulcer of right heel, stage 4: Secondary | ICD-10-CM | POA: Diagnosis not present

## 2016-01-26 DIAGNOSIS — F419 Anxiety disorder, unspecified: Secondary | ICD-10-CM | POA: Diagnosis not present

## 2016-01-26 DIAGNOSIS — I480 Paroxysmal atrial fibrillation: Secondary | ICD-10-CM | POA: Diagnosis not present

## 2016-01-26 DIAGNOSIS — R296 Repeated falls: Secondary | ICD-10-CM | POA: Diagnosis not present

## 2016-01-26 LAB — CULTURE, BLOOD (ROUTINE X 2)
CULTURE: NO GROWTH
Culture: NO GROWTH

## 2016-01-27 ENCOUNTER — Encounter (HOSPITAL_BASED_OUTPATIENT_CLINIC_OR_DEPARTMENT_OTHER): Payer: Medicare Other | Admitting: General Surgery

## 2016-01-27 DIAGNOSIS — I5032 Chronic diastolic (congestive) heart failure: Secondary | ICD-10-CM | POA: Diagnosis not present

## 2016-01-27 DIAGNOSIS — Z7901 Long term (current) use of anticoagulants: Secondary | ICD-10-CM | POA: Diagnosis not present

## 2016-01-27 DIAGNOSIS — I639 Cerebral infarction, unspecified: Secondary | ICD-10-CM | POA: Diagnosis not present

## 2016-01-27 DIAGNOSIS — F0390 Unspecified dementia without behavioral disturbance: Secondary | ICD-10-CM | POA: Diagnosis not present

## 2016-01-27 DIAGNOSIS — L97211 Non-pressure chronic ulcer of right calf limited to breakdown of skin: Secondary | ICD-10-CM | POA: Diagnosis not present

## 2016-01-27 DIAGNOSIS — L97212 Non-pressure chronic ulcer of right calf with fat layer exposed: Secondary | ICD-10-CM | POA: Diagnosis not present

## 2016-01-27 DIAGNOSIS — E11621 Type 2 diabetes mellitus with foot ulcer: Secondary | ICD-10-CM | POA: Diagnosis not present

## 2016-01-27 DIAGNOSIS — L89613 Pressure ulcer of right heel, stage 3: Secondary | ICD-10-CM

## 2016-01-27 DIAGNOSIS — J449 Chronic obstructive pulmonary disease, unspecified: Secondary | ICD-10-CM | POA: Diagnosis not present

## 2016-01-27 DIAGNOSIS — E1142 Type 2 diabetes mellitus with diabetic polyneuropathy: Secondary | ICD-10-CM | POA: Diagnosis not present

## 2016-01-27 DIAGNOSIS — S72001D Fracture of unspecified part of neck of right femur, subsequent encounter for closed fracture with routine healing: Secondary | ICD-10-CM | POA: Diagnosis not present

## 2016-01-27 DIAGNOSIS — L89614 Pressure ulcer of right heel, stage 4: Secondary | ICD-10-CM | POA: Diagnosis not present

## 2016-01-27 NOTE — Progress Notes (Signed)
See I heal note 

## 2016-01-27 NOTE — Progress Notes (Addendum)
LOUGENE, KALAJIAN ([id number]) Visit Report for 01/27/2016 Chief Complaint Document Details Patient Name: [name], [name] 01/27/2016 3:00 Date of Service: PM Medical Record [id number] Number: Patient Account Number: [id number] 23-Apr-1936 (80 y.o. Treating RN: Cornell Barman Date of Birth/Sex: Female) Other Clinician: Primary Care Physician: Halina Maidens Treating Jerline Pain, [place] Referring Physician: Halina Maidens Physician/Extender: Suella Grove in Treatment: 8 Information Obtained from: Patient Chief Complaint Patient is at the clinic for treatment of an open pressure ulcer to her right calcaneum which has been there for about 4 weeks Electronic Signature(s) Signed: 01/27/2016 2:57:16 PM By: Judene Companion MD Entered By: Judene Companion on 01/27/2016 14:57:15 [name] ([id number]) -------------------------------------------------------------------------------- HPI Details Patient Name: [name], [name] 01/27/2016 3:00 Date of Service: PM Medical Record [id number] Number: Patient Account Number: [id number] Feb 09, 1936 (80 y.o. Treating RN: Cornell Barman Date of Birth/Sex: Female) Other Clinician: Primary Care Physician: Halina Maidens Treating Jerline Pain, [place] Referring Physician: Halina Maidens Physician/Extender: Suella Grove in Treatment: 8 History of Present Illness Location: right posterior heel, and both lower extremities Quality: Patient reports experiencing a sharp pain to affected area(s). Severity: Patient states wound (s) are getting better. Duration: Patient has had the wound for < 4 weeks prior to presenting for treatment Timing: Pain in wound is Intermittent (comes and goes Context: The wound appeared gradually over time while she was in hospital with a right hip fracture Modifying Factors: Consults to this date include:was seen by her PCP and put on some antibiotics a while ago Associated Signs and Symptoms: Patient reports having difficulty standing for long  periods. HPI Description: 80 year old patient referred who was recently discharged in April of this year with a left heel ulcer now comes with a right heel ulcer and some ulcerations in both lower extremities which have all come along during her recent hospitalization which she had a right hip fracture. She has a past medical history of COPD, diabetes mellitus type 2 with chronic kidney disease, multi-infarct dementia, anxiety, urinary retention, paroxysmal atrial fibrillation. She has also had moderate malnutrition, multi-infarct dementia, congestive heart failure, left displaced femoral neck fracture, systemic lupus erythematosus. She is also status total abdominal hysterectomy with bilateral salpingo-oophorectomy, EGDs, hip arthroplasty on the left, cataract surgery. She has been a from a smoker and quit in May 2013 and she smoked for about 40 years. recently admitted to [facility] between January 19 and 07/03/2015 12/09/2015 -- o right foot x-ray -- IMPRESSION:Soft tissue ulceration of the heel. No underlying erosive bony abnormality. Diffuse degenerative change. No acute bony abnormality. 12/30/2015-- admitted to the hospital between July 12 and July 18 of 2017 and was treated for cerebral infarction, infected decubitus ulcer and aspiration pneumonia of the right lung. MRI was negative for stroke and was thought to be secondary to pneumonia. She was treated with clindamycin. Patient was discharged home on Keflex and Zyvox. Dr. Lucky Cowboy took her for an aortogram and a right lower extremity runoff and found to 30% stenosis in the mid to distal SFA but no significant stenosis in the popliteal artery and the SFA. There was a two-vessel runoff through the anterior tibial artery and peroneal artery without significant stenosis. felt her perfusion was adequate for wound healing. Electronic Signature(s) Signed: 01/27/2016 2:59:07 PM By: Judene Companion MD Entered By: Judene Companion  on 01/27/2016 14:59:07 [name] ([name] ([id number]) -------------------------------------------------------------------------------- Physical Exam Details Patient Name: [name], [name] 01/27/2016 3:00 Date of Service: PM Medical Record [id number] Number: Patient Account Number: [id number] [birth year] (80 y.o. Treating  RN: Cornell Barman Date of Birth/Sex: Female) Other Clinician: Primary Care Physician: Manuella Ghazi, [place] Referring Physician: Halina Maidens Physician/Extender: Suella Grove in Treatment: 8 Electronic Signature(s) Signed: 01/27/2016 2:59:30 PM By: Judene Companion MD Entered By: Judene Companion on 01/27/2016 14:59:30 [name] ([id number]) -------------------------------------------------------------------------------- Physician Orders Details Patient Name: [name], [name] 01/27/2016 3:00 Date of Service: PM Medical Record [id number] Number: Patient Account Number: [id number] 02-Mar-1936 (80 y.o. Treating RN: Cornell Barman Date of Birth/Sex: Female) Other Clinician: Primary Care Physician: Manuella Ghazi, [place] Referring Physician: Halina Maidens Physician/Extender: Suella Grove in Treatment: 8 Verbal / Phone Orders: Yes Clinician: Cornell Barman Read Back and Verified: Yes Diagnosis Coding ICD-10 Coding Code Description E11.621 Type 2 diabetes mellitus with foot ulcer L89.613 Pressure ulcer of right heel, stage 3 L97.212 Non-pressure chronic ulcer of right calf with fat layer exposed L97.211 Non-pressure chronic ulcer of right calf limited to breakdown of skin Z79.01 Long term (current) use of anticoagulants Wound Cleansing Wound #4 Right,Medial Lower Leg o Clean wound with Normal Saline. - in clinic o Cleanse wound with mild soap and water Wound #5 Right,Posterior Calcaneus o Clean wound with Normal Saline. - in clinic o Cleanse wound with mild soap and water Anesthetic Wound #4 Right,Medial  Lower Leg o Topical Lidocaine 4% cream applied to wound bed prior to debridement Wound #5 Right,Posterior Calcaneus o Topical Lidocaine 4% cream applied to wound bed prior to debridement Primary Wound Dressing Wound #4 Right,Medial Lower Leg o Promogran Wound #5 Right,Posterior Calcaneus o Promogran Secondary Dressing Wound #4 Right,Medial Lower Leg ELVERA, SMALTZ ([id number]) o Gauze and Kerlix/Conform Wound #5 Right,Posterior Calcaneus o Dry Gauze o Foam - foam heel cup Dressing Change Frequency Wound #4 Right,Medial Lower Leg o Change dressing every day. Wound #5 Right,Posterior Calcaneus o Change dressing every day. Follow-up Appointments o Return Appointment in 1 week. Off-Loading o Other: - float heels with pillow under calves when in bed; wear pressure relieving boots while in bed Additional Orders / Instructions Wound #4 Right,Medial Lower Leg o Increase protein intake. o Activity as tolerated Wound #5 Right,Posterior Calcaneus o Increase protein intake. o Activity as tolerated Electronic Signature(s) Signed: 01/27/2016 5:06:33 PM By: Gretta Cool RN, BSN, Kim RN, BSN Signed: 01/28/2016 8:09:12 AM By: Judene Companion MD Entered By: Gretta Cool, RN, BSN, Kim on 01/27/2016 15:25:30 [name] ([id number]) -------------------------------------------------------------------------------- Problem List Details Patient Name: [name], [name] 01/27/2016 3:00 Date of Service: PM Medical Record [id number] Number: Patient Account Number: [id number] 1936-04-23 (80 y.o. Treating RN: Cornell Barman Date of Birth/Sex: Female) Other Clinician: Primary Care Physician: Halina Maidens Treating Jerline Pain, [place] Referring Physician: Halina Maidens Physician/Extender: Suella Grove in Treatment: 8 Active Problems ICD-10 Encounter Code Description Active Date Diagnosis E11.621 Type 2 diabetes mellitus with foot ulcer 12/02/2015 Yes L89.613 Pressure ulcer of right  heel, stage 3 12/02/2015 Yes L97.212 Non-pressure chronic ulcer of right calf with fat layer 12/02/2015 Yes exposed L97.211 Non-pressure chronic ulcer of right calf limited to 12/02/2015 Yes breakdown of skin Z79.01 Long term (current) use of anticoagulants 12/02/2015 Yes Inactive Problems Resolved Problems Electronic Signature(s) Signed: 01/27/2016 2:56:52 PM By: Judene Companion MD Entered By: Judene Companion on 01/27/2016 14:56:52 [name] ([id number]) -------------------------------------------------------------------------------- Progress Note Details Patient Name: [name] 01/27/2016 3:00 Date of Service: PM Medical Record [id number] Number: Patient Account Number: [id number] 07-04-1935 (80 y.o. Treating RN: Cornell Barman Date of Birth/Sex: Female) Other Clinician: Primary Care Physician: Manuella Ghazi, Domitila [place] Referring Physician: Halina Maidens Physician/Extender: Suella Grove in Treatment: 8 Subjective  Chief Complaint Information obtained from Patient Patient is at the clinic for treatment of an open pressure ulcer to her right calcaneum which has been there for about 4 weeks History of Present Illness (HPI) The following HPI elements were documented for the patient's wound: Location: right posterior heel, and both lower extremities Quality: Patient reports experiencing a sharp pain to affected area(s). Severity: Patient states wound (s) are getting better. Duration: Patient has had the wound for < 4 weeks prior to presenting for treatment Timing: Pain in wound is Intermittent (comes and goes Context: The wound appeared gradually over time while she was in hospital with a right hip fracture Modifying Factors: Consults to this date include:was seen by her PCP and put on some antibiotics a while ago Associated Signs and Symptoms: Patient reports having difficulty standing for long periods. 80 year old patient referred who was recently discharged in April  of this year with a left heel ulcer now comes with a right heel ulcer and some ulcerations in both lower extremities which have all come along during her recent hospitalization which she had a right hip fracture. She has a past medical history of COPD, diabetes mellitus type 2 with chronic kidney disease, multi-infarct dementia, anxiety, urinary retention, paroxysmal atrial fibrillation. She has also had moderate malnutrition, multi-infarct dementia, congestive heart failure, left displaced femoral neck fracture, systemic lupus erythematosus. She is also status total abdominal hysterectomy with bilateral salpingo-oophorectomy, EGDs, hip arthroplasty on the left, cataract surgery. She has been a from a smoker and quit in May 2013 and she smoked for about 40 years. recently admitted to [facility] between January 19 and 07/03/2015 12/09/2015 -- right foot x-ray -- IMPRESSION:Soft tissue ulceration of the heel. No underlying erosive bony abnormality. Diffuse degenerative change. No acute bony abnormality. 12/30/2015-- admitted to the hospital between July 12 and July 18 of 2017 and was treated for cerebral infarction, infected decubitus ulcer and aspiration pneumonia of the right lung. MRI was negative for stroke and was thought to be secondary to pneumonia. She was treated with clindamycin. Patient was discharged home on Keflex and Zyvox. [name] ([id number]) Dr. Lucky Cowboy took her for an aortogram and a right lower extremity runoff and found to 30% stenosis in the mid to distal SFA but no significant stenosis in the popliteal artery and the SFA. There was a two-vessel runoff through the anterior tibial artery and peroneal artery without significant stenosis. felt her perfusion was adequate for wound healing. Assessment Active Problems ICD-10 E11.621 - Type 2 diabetes mellitus with foot ulcer L89.613 - Pressure ulcer of right heel, stage 3 L97.212 - Non-pressure  chronic ulcer of right calf with fat layer exposed L97.211 - Non-pressure chronic ulcer of right calf limited to breakdown of skin Z79.01 - Long term (current) use of anticoagulants Plan Post bilateral hip fractures with cause of pressure ulcer right heel 1 cm size. Very clean. Starting collagen dressings daily. Electronic Signature(s) Signed: 01/27/2016 3:22:51 PM By: Judene Companion MD Entered By: Judene Companion on 01/27/2016 15:22:50 [name] ([id number]) -------------------------------------------------------------------------------- [place] Details Patient Name: [name] Date of Service: 01/27/2016 Medical Record Number: [id number] Patient Account Number: [id number] Date of Birth/Sex: July 28, 1935 (80 y.o. Female) Treating RN: Cornell Barman Primary Care Physician: Halina Maidens Other Clinician: Referring Physician: Halina Maidens Treating Physician/Extender: Benjaman Pott in Treatment: 8 Diagnosis Coding ICD-10 Codes Code Description E11.621 Type 2 diabetes mellitus with foot ulcer L89.613 Pressure ulcer of right heel, stage 3 L97.212 Non-pressure chronic ulcer of  right calf with fat layer exposed L97.211 Non-pressure chronic ulcer of right calf limited to breakdown of skin Z79.01 Long term (current) use of anticoagulants Facility Procedures CPT4 Code: [id number] Description: 99213 - WOUND CARE VISIT-LEV 3 EST PT Modifier: Quantity: 1 Physician Procedures CPT4 Code: [id number] Description: [id number] - WC PHYS LEVEL 2 - EST PT ICD-10 Description Diagnosis L89.613 Pressure ulcer of right heel, stage 3 Modifier: Quantity: 1 Electronic Signature(s) Signed: 01/27/2016 5:06:33 PM By: Gretta Cool RN, BSN, Kim RN, BSN Signed: 01/28/2016 8:09:12 AM By: Judene Companion MD Previous Signature: 01/27/2016 3:23:18 PM Version By: Judene Companion MD Entered By: Gretta Cool, RN, BSN, Kim on 01/27/2016 17:05:03

## 2016-01-27 NOTE — Progress Notes (Addendum)
[name], [name] ([id number]) Visit Report for 01/27/2016 Arrival Information Details Patient Name: [name], [name]. Date of Service: 01/27/2016 3:00 PM Medical Record Number: [id number] Patient Account Number: [id number] Date of Birth/Sex: January 02, 1936 (79 y.o. Female) Treating RN: Cornell Barman Primary Care Physician: Halina Maidens Other Clinician: Referring Physician: Halina Maidens Treating Physician/Extender: Benjaman Pott in Treatment: 8 Visit Information History Since Last Visit Added or deleted any medications: No Patient Arrived: [name] Any new allergies or adverse reactions: No Arrival Time: 14:59 Had a fall or experienced change in No Accompanied By: caregiver and activities of daily living that may affect husband risk of falls: Transfer Assistance: Manual Signs or symptoms of abuse/neglect since last No Patient Identification Verified: Yes visito Secondary Verification Process Yes Hospitalized since last visit: No Completed: Has Dressing in Place as Prescribed: Yes Patient Requires Transmission- No Pain Present Now: No Based Precautions: Patient Has Alerts: Yes Patient Alerts: Patient on Blood Thinner Electronic Signature(s) Signed: 01/27/2016 5:06:33 PM By: Gretta Cool, RN, BSN, Kim RN, BSN Entered By: Gretta Cool, RN, BSN, Kim on 01/27/2016 15:00:37 [name] ([id number]) -------------------------------------------------------------------------------- Clinic Level of Care Assessment Details Patient Name: [name]. Date of Service: 01/27/2016 3:00 PM Medical Record Number: [id number] Patient Account Number: [id number] Date of Birth/Sex: 09/11/1935 (79 y.o. Female) Treating RN: Cornell Barman Primary Care Physician: Halina Maidens Other Clinician: Referring Physician: Halina Maidens Treating Physician/Extender: Benjaman Pott in Treatment: 8 Clinic Level of Care Assessment Items TOOL 4 Quantity Score []  - Use when only an EandM is performed on  FOLLOW-UP visit 0 ASSESSMENTS - Nursing Assessment / Reassessment X - Reassessment of Co-morbidities (includes updates in patient status) 1 10 X - Reassessment of Adherence to Treatment Plan 1 5 ASSESSMENTS - Wound and Skin Assessment / Reassessment X - Simple Wound Assessment / Reassessment - one wound 1 5 []  - Complex Wound Assessment / Reassessment - multiple wounds 0 []  - Dermatologic / Skin Assessment (not related to wound area) 0 ASSESSMENTS - Focused Assessment []  - Circumferential Edema Measurements - multi extremities 0 []  - Nutritional Assessment / Counseling / Intervention 0 []  - Lower Extremity Assessment (monofilament, tuning fork, pulses) 0 []  - Peripheral Arterial Disease Assessment (using hand held doppler) 0 ASSESSMENTS - Ostomy and/or Continence Assessment and Care []  - Incontinence Assessment and Management 0 []  - Ostomy Care Assessment and Management (repouching, etc.) 0 PROCESS - Coordination of Care X - Simple Patient / Family Education for ongoing care 1 15 []  - Complex (extensive) Patient / Family Education for ongoing care 0 []  - Staff obtains Programmer, systems, Records, Test Results / Process Orders 0 []  - Staff telephones HHA, Nursing Homes / Clarify orders / etc 0 []  - Routine Transfer to another Facility (non-emergent condition) 0 [name], [name] ([id number]) []  - Routine Hospital Admission (non-emergent condition) 0 []  - New Admissions / Biomedical engineer / Ordering NPWT, Apligraf, etc. 0 []  - Emergency Hospital Admission (emergent condition) 0 X - Simple Discharge Coordination 1 10 []  - Complex (extensive) Discharge Coordination 0 PROCESS - Special Needs []  - Pediatric / Minor Patient Management 0 []  - Isolation Patient Management 0 []  - Hearing / Language / Visual special needs 0 []  - Assessment of Community assistance (transportation, D/C planning, etc.) 0 []  - Additional assistance / Altered mentation 0 []  - Support Surface(s) Assessment (bed,  cushion, seat, etc.) 0 INTERVENTIONS - Wound Cleansing / Measurement X - Simple Wound Cleansing - one wound 1 5 []  - Complex Wound Cleansing - multiple wounds 0  X - Wound Imaging (photographs - any number of wounds) 1 5 []  - Wound Tracing (instead of photographs) 0 X - Simple Wound Measurement - one wound 1 5 []  - Complex Wound Measurement - multiple wounds 0 INTERVENTIONS - Wound Dressings []  - Small Wound Dressing one or multiple wounds 0 X - Medium Wound Dressing one or multiple wounds 1 15 []  - Large Wound Dressing one or multiple wounds 0 []  - Application of Medications - topical 0 []  - Application of Medications - injection 0 INTERVENTIONS - Miscellaneous []  - External ear exam 0 [name], [name] ([id number]) []  - Specimen Collection (cultures, biopsies, blood, body fluids, etc.) 0 []  - Specimen(s) / Culture(s) sent or taken to Lab for analysis 0 []  - Patient Transfer (multiple staff / Harrel Lemon Lift / Similar devices) 0 []  - Simple Staple / Suture removal (25 or less) 0 []  - Complex Staple / Suture removal (26 or more) 0 []  - Hypo / Hyperglycemic Management (close monitor of Blood Glucose) 0 []  - Ankle / Brachial Index (ABI) - do not check if billed separately 0 X - Vital Signs 1 5 Has the patient been seen at the hospital within the last three years: Yes Total Score: 80 Level Of Care: New/Established - Level 3 Electronic Signature(s) Signed: 01/27/2016 5:06:33 PM By: Gretta Cool, RN, BSN, Kim RN, BSN Entered By: Gretta Cool, RN, BSN, Kim on 01/27/2016 15:26:09 [name] ([id number]) -------------------------------------------------------------------------------- Encounter Discharge Information Details Patient Name: [name]. Date of Service: 01/27/2016 3:00 PM Medical Record Number: [id number] Patient Account Number: [id number] Date of Birth/Sex: October 08, 1935 (79 y.o. Female) Treating RN: Cornell Barman Primary Care Physician: Halina Maidens Other Clinician: Referring  Physician: Halina Maidens Treating Physician/Extender: Benjaman Pott in Treatment: 8 Encounter Discharge Information Items Discharge Pain Level: 0 Discharge Condition: Stable Ambulatory Status: Walker Discharge Destination: Home Transportation: Private Auto caregiver and Accompanied By: husband Schedule Follow-up Appointment: Yes Medication Reconciliation completed Yes and provided to Patient/Care Britney Newstrom: Provided on Clinical Summary of Care: 01/27/2016 Form Type Recipient Paper Patient LK Electronic Signature(s) Signed: 01/27/2016 5:06:33 PM By: Gretta Cool RN, BSN, Kim RN, BSN Previous Signature: 01/27/2016 3:25:44 PM Version By: Ruthine Dose Previous Signature: 01/27/2016 3:23:45 PM Version By: Judene Companion MD Entered By: Gretta Cool, RN, BSN, Kim on 01/27/2016 15:27:06 [name] ([id number]) -------------------------------------------------------------------------------- Lower Extremity Assessment Details Patient Name: [name], [name]. Date of Service: 01/27/2016 3:00 PM Medical Record Number: [id number] Patient Account Number: [id number] Date of Birth/Sex: Dec 29, 1935 (79 y.o. Female) Treating RN: Cornell Barman Primary Care Physician: Halina Maidens Other Clinician: Referring Physician: Halina Maidens Treating Physician/Extender: Benjaman Pott in Treatment: 8 Vascular Assessment Pulses: Posterior Tibial Dorsalis Pedis Palpable: [Right:Yes] Extremity colors, hair growth, and conditions: Extremity Color: [Right:Normal] Hair Growth on Extremity: [Right:Yes] Temperature of Extremity: [Right:Cool] Capillary Refill: [Right:< 3 seconds] Toe Nail Assessment Left: Right: Thick: No Discolored: No Deformed: No Improper Length and Hygiene: No Electronic Signature(s) Signed: 01/27/2016 5:06:33 PM By: Gretta Cool, RN, BSN, Kim RN, BSN Entered By: Gretta Cool, RN, BSN, Kim on 01/27/2016 15:10:16 [name]  ([id number]) -------------------------------------------------------------------------------- Multi Wound Chart Details Patient Name: [name]. Date of Service: 01/27/2016 3:00 PM Medical Record Number: [id number] Patient Account Number: [id number] Date of Birth/Sex: Jul 14, 1935 (79 y.o. Female) Treating RN: Cornell Barman Primary Care Physician: Halina Maidens Other Clinician: Referring Physician: Halina Maidens Treating Physician/Extender: Benjaman Pott in Treatment: 8 Vital Signs Height(in): 64 Pulse(bpm): 56 Weight(lbs): 110 Blood Pressure 156/41 (mmHg): Body Mass Index(BMI): 19 Temperature(F): 97.7 Respiratory Rate 18 (  breaths/min): Photos: [N/A:N/A] Wound Location: Right Lower Leg - Medial Right Calcaneus - N/A Posterior Wounding Event: Gradually Appeared Gradually Appeared N/A Primary Etiology: Diabetic Wound/Ulcer of Diabetic Wound/Ulcer of N/A the Lower Extremity the Lower Extremity Comorbid History: Chronic Obstructive Chronic Obstructive N/A Pulmonary Disease Pulmonary Disease (COPD), Angina, (COPD), Angina, Congestive Heart Failure, Congestive Heart Failure, Type II Diabetes, Lupus Type II Diabetes, Lupus Erythematosus, Erythematosus, Osteoarthritis, Dementia Osteoarthritis, Dementia Date Acquired: 11/03/2015 11/03/2015 N/A Weeks of Treatment: 8 8 N/A Wound Status: Open Open N/A Measurements L x W x D 0.2x0.2x0.1 1x1.5x0.1 N/A (cm) Area (cm) : 0.031 1.178 N/A Volume (cm) : 0.003 0.118 N/A % Reduction in Area: 98.70% 87.70% N/A % Reduction in Volume: 98.80% 87.60% N/A Classification: Grade 1 Grade 1 N/A Exudate Amount: None Present Large N/A [name], [name] ([id number]) Exudate Type: N/A Serosanguineous N/A Exudate Color: N/A red, brown N/A Foul Odor After No Yes N/A Cleansing: Odor Anticipated Due to N/A No N/A Product Use: Wound Margin: Distinct, outline attached Distinct, outline attached N/A Granulation Amount: Large (67-100%) Large  (67-100%) N/A Granulation Quality: Pink, Pale Pink, Pale N/A Necrotic Amount: Small (1-33%) Small (1-33%) N/A Necrotic Tissue: Eschar Adherent Slough N/A Exposed Structures: Fascia: No Fascia: No N/A Fat: No Fat: No Tendon: No Tendon: No Muscle: No Muscle: No Joint: No Joint: No Bone: No Bone: No Limited to Skin Limited to Skin Breakdown Breakdown Epithelialization: Large (67-100%) Medium (34-66%) N/A Periwound Skin Texture: Edema: Yes Edema: No N/A Excoriation: No Excoriation: No Induration: No Induration: No Callus: No Callus: No Crepitus: No Crepitus: No Fluctuance: No Fluctuance: No Friable: No Friable: No Rash: No Rash: No Scarring: No Scarring: No Periwound Skin Dry/Scaly: Yes Moist: Yes N/A Moisture: Maceration: No Maceration: No Moist: No Dry/Scaly: No Periwound Skin Color: Erythema: Yes Atrophie Blanche: No N/A Atrophie Blanche: No Cyanosis: No Cyanosis: No Ecchymosis: No Ecchymosis: No Erythema: No Hemosiderin Staining: No Hemosiderin Staining: No Mottled: No Mottled: No Pallor: No Pallor: No Rubor: No Rubor: No Erythema Location: Circumferential N/A N/A Temperature: No Abnormality No Abnormality N/A Tenderness on Yes Yes N/A Palpation: Wound Preparation: Ulcer Cleansing: Ulcer Cleansing: N/A Rinsed/Irrigated with Rinsed/Irrigated with Saline Saline Topical Anesthetic Topical Anesthetic Applied: None [name], [name] ([id number]) Applied: Other: lidocaine 4% Treatment Notes Electronic Signature(s) Signed: 01/27/2016 5:06:33 PM By: Gretta Cool, RN, BSN, Kim RN, BSN Entered By: Gretta Cool, RN, BSN, Kim on 01/27/2016 15:24:29 [name] ([id number]) -------------------------------------------------------------------------------- [place] Details Patient Name: [name], [name]. Date of Service: 01/27/2016 3:00 PM Medical Record Number: [id number] Patient Account Number: [id number] Date of Birth/Sex: [birth year] (79  y.o. Female) Treating RN: Cornell Barman Primary Care Physician: Halina Maidens Other Clinician: Referring Physician: Halina Maidens Treating Physician/Extender: Benjaman Pott in Treatment: 8 Active Inactive Abuse / Safety / Falls / Self Care Management Nursing Diagnoses: Potential for falls Goals: Patient/caregiver will verbalize understanding of skin care regimen Date Initiated: 12/02/2015 Goal Status: Active Patient/caregiver will verbalize/demonstrate measures taken to prevent injury and/or falls Date Initiated: 12/02/2015 Goal Status: Active Interventions: Assess fall risk on admission and as needed Assess: immobility, friction, shearing, incontinence upon admission and as needed Assess impairment of mobility on admission and as needed per policy Assess self care needs on admission and as needed Provide education on fall prevention Provide education on safe transfers Notes: Wound/Skin Impairment Nursing Diagnoses: Impaired tissue integrity Goals: Patient/caregiver will verbalize understanding of skin care regimen Date Initiated: 12/02/2015 Goal Status: Active Interventions: Assess patient/caregiver ability to obtain necessary supplies Assess patient/caregiver ability to perform  ulcer/skin care regimen upon admission and as needed [name], [name] ([id number]) Assess ulceration(s) every visit Provide education on ulcer and skin care Notes: Electronic Signature(s) Signed: 01/27/2016 5:06:33 PM By: Gretta Cool, RN, BSN, Kim RN, BSN Entered By: Gretta Cool, RN, BSN, Kim on 01/27/2016 15:24:18 [name] ([id number]) -------------------------------------------------------------------------------- Pain Assessment Details Patient Name: [name] Date of Service: 01/27/2016 3:00 PM Medical Record Number: [id number] Patient Account Number: [id number] Date of Birth/Sex: Jun 13, 1935 (79 y.o. Female) Treating RN: Cornell Barman Primary Care Physician: Halina Maidens Other  Clinician: Referring Physician: Halina Maidens Treating Physician/Extender: Benjaman Pott in Treatment: 8 Active Problems Location of Pain Severity and Description of Pain Patient Has Paino No Site Locations With Dressing Change: No Pain Management and Medication Current Pain Management: Electronic Signature(s) Signed: 01/27/2016 5:06:33 PM By: Gretta Cool, RN, BSN, Kim RN, BSN Entered By: Gretta Cool, RN, BSN, Kim on 01/27/2016 15:00:50 [name] ([id number]) -------------------------------------------------------------------------------- Patient/Caregiver Education Details Patient Name: [name]. Date of Service: 01/27/2016 3:00 PM Medical Record Number: [id number] Patient Account Number: [id number] Date of Birth/Gender: [birth year] (80 y.o. Female) Treating RN: Cornell Barman Primary Care Physician: Halina Maidens Other Clinician: Referring Physician: Halina Maidens Treating Physician/Extender: Benjaman Pott in Treatment: 8 Education Assessment Education Provided To: Patient Education Topics Provided Wound/Skin Impairment: Handouts: Caring for Your Ulcer, Other: wound care as prescribed Methods: Demonstration Responses: State content correctly Electronic Signature(s) Signed: 01/27/2016 5:06:33 PM By: Gretta Cool, RN, BSN, Kim RN, BSN Entered By: Gretta Cool, RN, BSN, Kim on 01/27/2016 15:27:26 [name] ([id number]) -------------------------------------------------------------------------------- Wound Assessment Details Patient Name: [name] Date of Service: 01/27/2016 3:00 PM Medical Record Number: [id number] Patient Account Number: [id number] Date of Birth/Sex: Oct 07, 1935 (79 y.o. Female) Treating RN: Cornell Barman Primary Care Physician: Halina Maidens Other Clinician: Referring Physician: Halina Maidens Treating Physician/Extender: Benjaman Pott in Treatment: 8 Wound Status Wound Number: 4 Primary Diabetic Wound/Ulcer of the  Lower Etiology: Extremity Wound Location: Right Lower Leg - Medial Wound Open Wounding Event: Gradually Appeared Status: Date Acquired: 11/03/2015 Comorbid Chronic Obstructive Pulmonary Disease Weeks Of Treatment: 8 History: (COPD), Angina, Congestive Heart Clustered Wound: No Failure, Type II Diabetes, Lupus Erythematosus, Osteoarthritis, Dementia Photos Wound Measurements Length: (cm) 0.2 Width: (cm) 0.2 Depth: (cm) 0.1 Area: (cm) 0.031 Volume: (cm) 0.003 % Reduction in Area: 98.7% % Reduction in Volume: 98.8% Epithelialization: Large (67-100%) Tunneling: No Undermining: No Wound Description Classification: Grade 1 Wound Margin: Distinct, outline attached Exudate Amount: None Present Foul Odor After Cleansing: No Wound Bed Granulation Amount: Large (67-100%) Exposed Structure Granulation Quality: Pink, Pale Fascia Exposed: No Necrotic Amount: Small (1-33%) Fat Layer Exposed: No Necrotic Quality: Eschar Tendon Exposed: No Muscle Exposed: No Joint Exposed: No [name], GLADWIN ([id number]) Bone Exposed: No Limited to Skin Breakdown Periwound Skin Texture Texture Color No Abnormalities Noted: No No Abnormalities Noted: No Callus: No Atrophie Blanche: No Crepitus: No Cyanosis: No Excoriation: No Ecchymosis: No Fluctuance: No Erythema: Yes Friable: No Erythema Location: Circumferential Induration: No Hemosiderin Staining: No Localized Edema: Yes Mottled: No Rash: No Pallor: No Scarring: No Rubor: No Moisture Temperature / Pain No Abnormalities Noted: No Temperature: No Abnormality Dry / Scaly: Yes Tenderness on Palpation: Yes Maceration: No Moist: No Wound Preparation Ulcer Cleansing: Rinsed/Irrigated with Saline Topical Anesthetic Applied: None Treatment Notes Wound #4 (Right, Medial Lower Leg) 1. Cleansed with: Clean wound with Normal Saline 2. Anesthetic Topical Lidocaine 4% cream to wound bed prior to debridement 4. Dressing  Applied: Promogran 5. Secondary Dressing Applied Gauze and Kerlix/Conform 7.  Secured with Recruitment consultant) Signed: 01/27/2016 5:06:33 PM By: Gretta Cool, RN, BSN, Kim RN, BSN Entered By: Gretta Cool, RN, BSN, Kim on 01/27/2016 15:09:03 [name] ([id number]) -------------------------------------------------------------------------------- Wound Assessment Details Patient Name: [name], [name]. Date of Service: 01/27/2016 3:00 PM Medical Record Number: [id number] Patient Account Number: [id number] Date of Birth/Sex: 08-Sep-1935 (79 y.o. Female) Treating RN: Cornell Barman Primary Care Physician: Halina Maidens Other Clinician: Referring Physician: Halina Maidens Treating Physician/Extender: Benjaman Pott in Treatment: 8 Wound Status Wound Number: 5 Primary Diabetic Wound/Ulcer of the Lower Etiology: Extremity Wound Location: Right Calcaneus - Posterior Wound Open Wounding Event: Gradually Appeared Status: Date Acquired: 11/03/2015 Comorbid Chronic Obstructive Pulmonary Disease Weeks Of Treatment: 8 History: (COPD), Angina, Congestive Heart Clustered Wound: No Failure, Type II Diabetes, Lupus Erythematosus, Osteoarthritis, Dementia Photos Wound Measurements Length: (cm) 1 Width: (cm) 1.5 Depth: (cm) 0.1 Area: (cm) 1.178 Volume: (cm) 0.118 % Reduction in Area: 87.7% % Reduction in Volume: 87.6% Epithelialization: Medium (34-66%) Tunneling: No Undermining: No Wound Description Classification: Grade 1 Wound Margin: Distinct, outline attached Exudate Amount: Large Exudate Type: Serosanguineous Exudate Color: red, brown Foul Odor After Cleansing: Yes Due to Product Use: No Wound Bed Granulation Amount: Large (67-100%) Exposed Structure Granulation Quality: Pink, Pale Fascia Exposed: No Necrotic Amount: Small (1-33%) Fat Layer Exposed: No Necrotic Quality: Adherent Slough Tendon Exposed: No NAKEA, KROLICK ([id number]) Muscle Exposed: No Joint  Exposed: No Bone Exposed: No Limited to Skin Breakdown Periwound Skin Texture Texture Color No Abnormalities Noted: No No Abnormalities Noted: No Callus: No Atrophie Blanche: No Crepitus: No Cyanosis: No Excoriation: No Ecchymosis: No Fluctuance: No Erythema: No Friable: No Hemosiderin Staining: No Induration: No Mottled: No Localized Edema: No Pallor: No Rash: No Rubor: No Scarring: No Temperature / Pain Moisture Temperature: No Abnormality No Abnormalities Noted: No Tenderness on Palpation: Yes Dry / Scaly: No Maceration: No Moist: Yes Wound Preparation Ulcer Cleansing: Rinsed/Irrigated with Saline Topical Anesthetic Applied: Other: lidocaine 4%, Treatment Notes Wound #5 (Right, Posterior Calcaneus) 1. Cleansed with: Clean wound with Normal Saline 2. Anesthetic Topical Lidocaine 4% cream to wound bed prior to debridement 4. Dressing Applied: Promogran 5. Secondary Dressing Applied Gauze and Kerlix/Conform 7. Secured with Recruitment consultant) Signed: 01/27/2016 5:06:33 PM By: Gretta Cool, RN, BSN, Kim RN, BSN Entered By: Gretta Cool, RN, BSN, Kim on 01/27/2016 15:08:14 [name] ([id number]) -------------------------------------------------------------------------------- [place] Details Patient Name: [name] Date of Service: 01/27/2016 3:00 PM Medical Record Number: [id number] Patient Account Number: [id number] Date of Birth/Sex: 1935-09-22 (79 y.o. Female) Treating RN: Cornell Barman Primary Care Physician: Halina Maidens Other Clinician: Referring Physician: Halina Maidens Treating Physician/Extender: Benjaman Pott in Treatment: 8 Vital Signs Time Taken: 15:00 Temperature (F): 97.7 Height (in): 64 Pulse (bpm): 56 Weight (lbs): 110 Respiratory Rate (breaths/min): 18 Body Mass Index (BMI): 18.9 Blood Pressure (mmHg): 156/41 Reference Range: 80 - 120 mg / dl Electronic Signature(s) Signed: 01/27/2016 5:06:33 PM By: Gretta Cool, RN, BSN,  Kim RN, BSN Entered By: Gretta Cool, RN, BSN, Kim on 01/27/2016 15:01:14

## 2016-01-28 DIAGNOSIS — L89614 Pressure ulcer of right heel, stage 4: Secondary | ICD-10-CM | POA: Diagnosis not present

## 2016-01-28 DIAGNOSIS — S72001D Fracture of unspecified part of neck of right femur, subsequent encounter for closed fracture with routine healing: Secondary | ICD-10-CM | POA: Diagnosis not present

## 2016-01-28 DIAGNOSIS — I5032 Chronic diastolic (congestive) heart failure: Secondary | ICD-10-CM | POA: Diagnosis not present

## 2016-01-28 DIAGNOSIS — J449 Chronic obstructive pulmonary disease, unspecified: Secondary | ICD-10-CM | POA: Diagnosis not present

## 2016-01-28 DIAGNOSIS — F0390 Unspecified dementia without behavioral disturbance: Secondary | ICD-10-CM | POA: Diagnosis not present

## 2016-01-28 DIAGNOSIS — E1142 Type 2 diabetes mellitus with diabetic polyneuropathy: Secondary | ICD-10-CM | POA: Diagnosis not present

## 2016-01-31 ENCOUNTER — Ambulatory Visit: Payer: Medicare Other | Admitting: Cardiovascular Disease

## 2016-02-01 ENCOUNTER — Other Ambulatory Visit: Payer: Self-pay | Admitting: Internal Medicine

## 2016-02-01 DIAGNOSIS — S72001D Fracture of unspecified part of neck of right femur, subsequent encounter for closed fracture with routine healing: Secondary | ICD-10-CM | POA: Diagnosis not present

## 2016-02-01 DIAGNOSIS — B373 Candidiasis of vulva and vagina: Secondary | ICD-10-CM

## 2016-02-01 DIAGNOSIS — L89614 Pressure ulcer of right heel, stage 4: Secondary | ICD-10-CM | POA: Diagnosis not present

## 2016-02-01 DIAGNOSIS — B3731 Acute candidiasis of vulva and vagina: Secondary | ICD-10-CM

## 2016-02-01 DIAGNOSIS — J449 Chronic obstructive pulmonary disease, unspecified: Secondary | ICD-10-CM | POA: Diagnosis not present

## 2016-02-01 DIAGNOSIS — E1142 Type 2 diabetes mellitus with diabetic polyneuropathy: Secondary | ICD-10-CM | POA: Diagnosis not present

## 2016-02-01 DIAGNOSIS — I5032 Chronic diastolic (congestive) heart failure: Secondary | ICD-10-CM | POA: Diagnosis not present

## 2016-02-01 DIAGNOSIS — F0390 Unspecified dementia without behavioral disturbance: Secondary | ICD-10-CM | POA: Diagnosis not present

## 2016-02-01 MED ORDER — FLUCONAZOLE 100 MG PO TABS
100.0000 mg | ORAL_TABLET | Freq: Every day | ORAL | 0 refills | Status: DC
Start: 2016-02-01 — End: 2016-02-21

## 2016-02-02 ENCOUNTER — Encounter (INDEPENDENT_AMBULATORY_CARE_PROVIDER_SITE_OTHER): Payer: Medicare Other | Admitting: Internal Medicine

## 2016-02-02 DIAGNOSIS — E1142 Type 2 diabetes mellitus with diabetic polyneuropathy: Secondary | ICD-10-CM | POA: Diagnosis not present

## 2016-02-02 DIAGNOSIS — L89614 Pressure ulcer of right heel, stage 4: Secondary | ICD-10-CM | POA: Diagnosis not present

## 2016-02-02 DIAGNOSIS — F0390 Unspecified dementia without behavioral disturbance: Secondary | ICD-10-CM

## 2016-02-02 DIAGNOSIS — S72001D Fracture of unspecified part of neck of right femur, subsequent encounter for closed fracture with routine healing: Secondary | ICD-10-CM | POA: Diagnosis not present

## 2016-02-02 DIAGNOSIS — L89613 Pressure ulcer of right heel, stage 3: Secondary | ICD-10-CM

## 2016-02-02 DIAGNOSIS — I5032 Chronic diastolic (congestive) heart failure: Secondary | ICD-10-CM | POA: Diagnosis not present

## 2016-02-02 DIAGNOSIS — J9611 Chronic respiratory failure with hypoxia: Secondary | ICD-10-CM | POA: Diagnosis not present

## 2016-02-02 DIAGNOSIS — J449 Chronic obstructive pulmonary disease, unspecified: Secondary | ICD-10-CM | POA: Diagnosis not present

## 2016-02-02 NOTE — Progress Notes (Signed)
Received orders from [facility] who was seeing [name] while she was at [facility]. Start of care 11/27/15 through 01/25/16. Orders reviewed, signed and faxed.

## 2016-02-03 ENCOUNTER — Other Ambulatory Visit: Payer: Self-pay

## 2016-02-03 ENCOUNTER — Encounter: Payer: Medicare Other | Admitting: Surgery

## 2016-02-03 DIAGNOSIS — L97212 Non-pressure chronic ulcer of right calf with fat layer exposed: Secondary | ICD-10-CM | POA: Diagnosis not present

## 2016-02-03 DIAGNOSIS — L97211 Non-pressure chronic ulcer of right calf limited to breakdown of skin: Secondary | ICD-10-CM | POA: Diagnosis not present

## 2016-02-03 DIAGNOSIS — J449 Chronic obstructive pulmonary disease, unspecified: Secondary | ICD-10-CM | POA: Diagnosis not present

## 2016-02-03 DIAGNOSIS — L97411 Non-pressure chronic ulcer of right heel and midfoot limited to breakdown of skin: Secondary | ICD-10-CM | POA: Diagnosis not present

## 2016-02-03 DIAGNOSIS — E11621 Type 2 diabetes mellitus with foot ulcer: Secondary | ICD-10-CM | POA: Diagnosis not present

## 2016-02-03 DIAGNOSIS — L89613 Pressure ulcer of right heel, stage 3: Secondary | ICD-10-CM | POA: Diagnosis not present

## 2016-02-03 DIAGNOSIS — Z7901 Long term (current) use of anticoagulants: Secondary | ICD-10-CM | POA: Diagnosis not present

## 2016-02-03 MED ORDER — RANITIDINE HCL 150 MG PO TABS: 150.0000 mg | ORAL_TABLET | Freq: Two times a day (BID) | ORAL | 1 refills | Status: DC

## 2016-02-03 MED ORDER — AMIODARONE HCL 200 MG PO TABS: 200.0000 mg | ORAL_TABLET | Freq: Every day | ORAL | 1 refills | Status: DC

## 2016-02-03 MED ORDER — JANUVIA 100 MG PO TABS: 50.0000 mg | ORAL_TABLET | Freq: Every day | ORAL | 0 refills | Status: DC

## 2016-02-03 MED ORDER — METOPROLOL TARTRATE 25 MG PO TABS: 25.0000 mg | ORAL_TABLET | Freq: Two times a day (BID) | ORAL | 3 refills | Status: DC

## 2016-02-03 MED ORDER — AMIODARONE HCL 200 MG PO TABS: 200.0000 mg | ORAL_TABLET | Freq: Every day | ORAL | 0 refills | Status: DC

## 2016-02-03 MED ORDER — JANUVIA 100 MG PO TABS: 50.0000 mg | ORAL_TABLET | Freq: Every day | ORAL | 1 refills | Status: DC

## 2016-02-04 ENCOUNTER — Other Ambulatory Visit: Payer: Self-pay | Admitting: Internal Medicine

## 2016-02-04 DIAGNOSIS — L89614 Pressure ulcer of right heel, stage 4: Secondary | ICD-10-CM | POA: Diagnosis not present

## 2016-02-04 DIAGNOSIS — F0390 Unspecified dementia without behavioral disturbance: Secondary | ICD-10-CM | POA: Diagnosis not present

## 2016-02-04 DIAGNOSIS — E1142 Type 2 diabetes mellitus with diabetic polyneuropathy: Secondary | ICD-10-CM | POA: Diagnosis not present

## 2016-02-04 DIAGNOSIS — S72001D Fracture of unspecified part of neck of right femur, subsequent encounter for closed fracture with routine healing: Secondary | ICD-10-CM | POA: Diagnosis not present

## 2016-02-04 DIAGNOSIS — I5032 Chronic diastolic (congestive) heart failure: Secondary | ICD-10-CM | POA: Diagnosis not present

## 2016-02-04 DIAGNOSIS — J449 Chronic obstructive pulmonary disease, unspecified: Secondary | ICD-10-CM | POA: Diagnosis not present

## 2016-02-04 MED ORDER — METOPROLOL TARTRATE 25 MG PO TABS: 25.0000 mg | ORAL_TABLET | Freq: Two times a day (BID) | ORAL | 0 refills | Status: DC

## 2016-02-04 MED ORDER — AMIODARONE HCL 200 MG PO TABS: 200.0000 mg | ORAL_TABLET | Freq: Every day | ORAL | 0 refills | Status: AC

## 2016-02-04 MED ORDER — JANUVIA 100 MG PO TABS: 50.0000 mg | ORAL_TABLET | Freq: Every day | ORAL | 0 refills | Status: DC

## 2016-02-04 NOTE — Progress Notes (Signed)
[name], [name] ([id number]) Visit Report for 02/03/2016 Chief Complaint Document Details [name] Name: [name], [name] 02/03/2016 3:00 Date of Service: PM Medical Record [id number] Number: [name] Account Number: [id number] 1935/09/15 (80 y.o. Treating [name]: [name] Physician/Extender: [name] Chief Complaint [name] is at the clinic for treatment of an open pressure ulcer to her right calcaneum which has been there for about 4 weeks Electronic Signature(s) Signed: 02/03/2016 3:31:29 PM By: Madison Fudge MD, FACS Entered By: [name] on 02/03/2016 15:31:29 [name] ([id number]) -------------------------------------------------------------------------------- Debridement Details [name] Name: [name], [name] 02/03/2016 3:00 Date of Service: PM Medical Record [id number] Number: [name] Account Number: [id number] 03-Feb-1936 (80 y.o. Treating [name]: [name] Physician/Extender: [name] in Treatment: 9 Debridement Performed for Wound #5 Right,Medial Calcaneus Assessment: Performed By: Physician Madison Fudge, MD Debridement: Debridement Pre-procedure Yes - 15:22 Verification/Time Out Taken: Start Time: 15:22 Pain Control: Lidocaine 4% Topical Solution Level: Skin/Subcutaneous Tissue Total Area Debrided (L x 1.1 (cm) x 1.5 (cm) = 1.65 (cm) W): Tissue and other Exudate, Fibrin/Slough, Subcutaneous material debrided: Instrument: Curette Bleeding: Minimum Hemostasis Achieved: Pressure End Time: 15:27 Procedural Pain: 0 Post Procedural Pain: 0 Response to Treatment: Procedure was tolerated well Post Debridement  Measurements of Total Wound Length: (cm) 1.1 Width: (cm) 1.5 Depth: (cm) 0.1 Volume: (cm) 0.13 Character of Wound/Ulcer Post Stable Debridement: Severity of Tissue Post Debridement: Fat layer exposed Post Procedure Diagnosis Same as Pre-procedure Electronic Signature(s) Signed: 02/03/2016 3:31:20 PM By: Madison Fudge MD, FACS Signed: 02/03/2016 4:59:47 PM By: [name] [name], [name], [name] [name], [name] [name], [name] ([id number]) Entered By: [name] on 02/03/2016 15:31:20 [name] ([id number]) -------------------------------------------------------------------------------- HPI Details [name] Name: [name], [name] 02/03/2016 3:00 Date of Service: PM Medical Record [id number] Number: [name] Account Number: [id number] [birth year] (80 y.o. Treating [name]: [name] Physician/Extender: [name] in Treatment: 9 History of Present Illness Location: right posterior heel, and both lower extremities Quality: [name] reports experiencing a sharp pain to affected area(s). Severity: [name] states wound (s) are getting better. Duration: [name] has had the wound for < 4 weeks prior to presenting for treatment Timing: Pain in wound is Intermittent (comes and goes Context: The wound appeared gradually over time while she was in hospital with a right hip fracture Modifying Factors: Consults to this date include:was seen by her PCP and put on some antibiotics a while ago Associated Signs and Symptoms: [name] reports having difficulty standing for long periods. HPI Description: 80 year old [name] referred who was recently discharged in April of this year with a left heel ulcer now comes with a right heel ulcer and some ulcerations in both lower extremities which have all come along during her recent hospitalization which she had a right hip  fracture. She has a past medical history of COPD, diabetes mellitus type 2 with chronic kidney disease, multi-infarct dementia, anxiety, urinary retention, paroxysmal atrial fibrillation. She has also had moderate malnutrition, multi-infarct dementia, congestive heart failure, left displaced femoral neck fracture, systemic lupus erythematosus. She is also status total abdominal hysterectomy with bilateral salpingo-oophorectomy, EGDs, hip arthroplasty on the left, cataract surgery. She has been a from a smoker and quit in May 2013 and  she smoked for about 40 years. recently admitted to [facility] between January 19 and 07/03/2015 12/09/2015 -- o right foot x-ray -- IMPRESSION:Soft tissue ulceration of the heel. No underlying erosive bony abnormality. Diffuse degenerative change. No acute bony abnormality. 12/30/2015-- admitted to the hospital between July 12 and July 18 of 2017 and was treated for cerebral infarction, infected decubitus ulcer and aspiration pneumonia of the right lung. MRI was negative for stroke and was thought to be secondary to pneumonia. She was treated with clindamycin. [name] was discharged home on Keflex and Zyvox. Dr. Lucky Cowboy took her for an aortogram and a right lower extremity runoff and found to 30% stenosis in the mid to distal SFA but no significant stenosis in the popliteal artery and the SFA. There was a two-vessel runoff through the anterior tibial artery and peroneal artery without significant stenosis. felt her perfusion was adequate for wound healing. Electronic Signature(s) Signed: 02/03/2016 3:31:38 PM By: Madison Fudge MD, FACS Entered By: [name] on 02/03/2016 15:31:38 [name] ([name] ([id number]) -------------------------------------------------------------------------------- Physical Exam Details [name] Name: [name], [name] 02/03/2016 3:00 Date of Service: PM Medical  Record [id number] Number: [name] Account Number: [id number] [birth year] (80 y.o. Treating [name]: [name] Physician/Extender: [name] in Treatment: 9 Constitutional . Pulse regular. Respirations normal and unlabored. Afebrile. . Eyes Nonicteric. Reactive to light. Ears, Nose, Mouth, and Throat Lips, teeth, and gums WNL.Marland Kitchen Moist mucosa without lesions. Neck supple and nontender. No palpable supraclavicular or cervical adenopathy. Normal sized without goiter. Respiratory WNL. No retractions.. Cardiovascular Pedal Pulses WNL. No clubbing, cyanosis or edema. Lymphatic No adneopathy. No adenopathy. No adenopathy. Musculoskeletal Adexa without tenderness or enlargement.. Digits and nails w/o clubbing, cyanosis, infection, petechiae, ischemia, or inflammatory conditions.. Integumentary (Hair, Skin) No suspicious lesions. No crepitus or fluctuance. No peri-wound warmth or erythema. No masses.Marland Kitchen Psychiatric Judgement and insight Intact.. No evidence of depression, anxiety, or agitation.. Notes the right medial ankle has a very adherent eschar but the wound seems to have healed and we will keep a close watch on this. The right heel has a lot of surrounding callus and debris at the depths of the wound and I have sharply removed this with a #3 curet and bleeding controlled with pressure Electronic Signature(s) Signed: 02/03/2016 3:32:36 PM By: Madison Fudge MD, FACS Entered By: [name] on 02/03/2016 15:32:36 [name] ([id number]) -------------------------------------------------------------------------------- Physician Orders Details [name] Name: [name]. Date of Service: 02/03/2016 3:00 PM Medical Record [name] Account Number: [id number] Number: Afful, [name], [name], Treating [name]: May 25, 1936 (80 y.o. [name] Date of  Birth/Sex: Female) Other Clinician: Primary Care Physician: [name] Treating [name] Referring Physician: Halina [name] Physician/Extender: [name] in Treatment: 9 Verbal / Phone Orders: Yes Clinician: Afful, [name], [name], [name] Read Back and Verified: Yes Diagnosis Coding Wound Cleansing Wound #4 Right,Medial Lower Leg o Clean wound with Normal Saline. - in clinic o Cleanse wound with mild soap and water Wound #5 Right,Medial Calcaneus o Clean wound with Normal Saline. - in clinic o Cleanse wound with mild soap and water Anesthetic Wound #4 Right,Medial Lower Leg o Topical Lidocaine 4% cream applied to wound bed prior to debridement Wound #5 Right,Medial Calcaneus o Topical Lidocaine 4% cream applied to wound bed prior to debridement Primary Wound Dressing Wound #4 Right,Medial Lower Leg o Santyl Ointment Secondary Dressing Wound #4 Right,Medial Lower Leg o Gauze and  Kerlix/Conform Wound #5 Right,Medial Calcaneus o Dry Gauze o Foam - foam heel cup Dressing Change Frequency Wound #4 Right,Medial Lower Leg o Change dressing every day. Wound #5 Right,Medial Calcaneus o Change dressing every day. [name], [name] ([id number]) Follow-up Appointments o Return Appointment in 1 week. Off-Loading o Other: - float heels with pillow under calves when in bed; wear pressure relieving boots while in bed Additional Orders / Instructions Wound #4 Right,Medial Lower Leg o Increase protein intake. o Activity as tolerated Wound #5 Right,Medial Calcaneus o Increase protein intake. o Activity as tolerated Electronic Signature(s) Signed: 02/03/2016 4:46:57 PM By: Madison Fudge MD, FACS Signed: 02/03/2016 5:25:53 PM By: Regan Lemming BSN, [name] Entered By: Regan Lemming on 02/03/2016 15:26:34 [name] ([id number]) -------------------------------------------------------------------------------- Problem List Details [name] Name: [name], [name] 02/03/2016 3:00 Date of Service: PM Medical Record [id number] Number: [name] Account Number: [id number] [birth year] (80 y.o. Treating [name]: [name] Physician/Extender: [name] in Treatment: 9 Active Problems ICD-10 Encounter Code Description Active Date Diagnosis E11.621 Type 2 diabetes mellitus with foot ulcer 12/02/2015 Yes L89.613 Pressure ulcer of right heel, stage 3 12/02/2015 Yes L97.212 Non-pressure chronic ulcer of right calf with fat layer 12/02/2015 Yes exposed L97.211 Non-pressure chronic ulcer of right calf limited to 12/02/2015 Yes breakdown of skin Z79.01 Long term (current) use of anticoagulants 12/02/2015 Yes Inactive Problems Resolved Problems Electronic Signature(s) Signed: 02/03/2016 3:31:02 PM By: Madison Fudge MD, FACS Entered By: [name] on 02/03/2016 15:31:02 [name] ([id number]) -------------------------------------------------------------------------------- Progress Note Details [name] Name: [name] 02/03/2016 3:00 Date of Service: PM Medical Record [id number] Number: [name] Account Number: [id number] 1935-07-29 (80 y.o. Treating [name]: [name] Physician/Extender: [name] in Treatment: 9 Subjective Chief Complaint Information obtained from [name] [name] is at the clinic for treatment of an open pressure ulcer to her right calcaneum which has been there for about 4 weeks History of Present Illness (HPI) The following HPI elements were documented for the [name]'s wound: Location: right posterior heel, and both lower extremities Quality: [name] reports experiencing a sharp pain to affected area(s). Severity: [name] states wound  (s) are getting better. Duration: [name] has had the wound for < 4 weeks prior to presenting for treatment Timing: Pain in wound is Intermittent (comes and goes Context: The wound appeared gradually over time while she was in hospital with a right hip fracture Modifying Factors: Consults to this date include:was seen by her PCP and put on some antibiotics a while ago Associated Signs and Symptoms: [name] reports having difficulty standing for long periods. 80 year old [name] referred who was recently discharged in April of this year with a left heel ulcer now comes with a right heel ulcer and some ulcerations in both lower extremities which have all come along during her recent hospitalization which she had a right hip fracture. She has a past medical history of COPD, diabetes mellitus type 2 with chronic kidney disease, multi-infarct dementia, anxiety, urinary retention, paroxysmal atrial fibrillation. She has also had moderate malnutrition, multi-infarct dementia, congestive heart failure, left displaced femoral neck fracture, systemic lupus erythematosus. She is also status total abdominal hysterectomy with bilateral salpingo-oophorectomy, EGDs, hip arthroplasty on the left, cataract surgery. She has been a from a smoker and quit in May 2013 and she smoked for about 40 years. recently admitted  to [facility] between January 19 and 07/03/2015 12/09/2015 -- right foot x-ray -- IMPRESSION:Soft tissue ulceration of the heel. No underlying erosive bony abnormality. Diffuse degenerative change. No acute bony abnormality. 12/30/2015-- admitted to the hospital between July 12 and July 18 of 2017 and was treated for cerebral infarction, infected decubitus ulcer and aspiration pneumonia of the right lung. MRI was negative for stroke and was thought to be secondary to pneumonia. She was treated with clindamycin. [name] was discharged home on Keflex and Zyvox. [name]  ([id number]) Dr. Lucky Cowboy took her for an aortogram and a right lower extremity runoff and found to 30% stenosis in the mid to distal SFA but no significant stenosis in the popliteal artery and the SFA. There was a two-vessel runoff through the anterior tibial artery and peroneal artery without significant stenosis. felt her perfusion was adequate for wound healing. Objective Constitutional Pulse regular. Respirations normal and unlabored. Afebrile. Vitals Time Taken: 3:06 PM, Height: 64 in, Weight: 110 lbs, BMI: 18.9, Temperature: 97.8 F, Pulse: 57 bpm, Respiratory Rate: 16 breaths/min, Blood Pressure: 140/55 mmHg. Eyes Nonicteric. Reactive to light. Ears, Nose, Mouth, and Throat Lips, teeth, and gums WNL.Marland Kitchen Moist mucosa without lesions. Neck supple and nontender. No palpable supraclavicular or cervical adenopathy. Normal sized without goiter. Respiratory WNL. No retractions.. Cardiovascular Pedal Pulses WNL. No clubbing, cyanosis or edema. Lymphatic No adneopathy. No adenopathy. No adenopathy. Musculoskeletal Adexa without tenderness or enlargement.. Digits and nails w/o clubbing, cyanosis, infection, petechiae, ischemia, or inflammatory conditions.Marland Kitchen Psychiatric Judgement and insight Intact.. No evidence of depression, anxiety, or agitation.. General Notes: the right medial ankle has a very adherent eschar but the wound seems to have healed and we will keep a close watch on this. The right heel has a lot of surrounding callus and debris at the depths of the wound and I have sharply removed this with a #3 curet and bleeding controlled with pressure Integumentary (Hair, Skin) ANALLELY, [name]. ([id number]) No suspicious lesions. No crepitus or fluctuance. No peri-wound warmth or erythema. No masses.. Wound #4 status is Healed - Epithelialized. Original cause of wound was Gradually Appeared. The wound is located on the Right,Medial Lower Leg. The wound measures 0cm length x 0cm width x 0cm  depth; 0cm^2 area and 0cm^3 volume. The wound is limited to skin breakdown. There is no tunneling or undermining noted. There is a none present amount of drainage noted. The wound margin is distinct with the outline attached to the wound base. There is no granulation within the wound bed. There is no necrotic tissue within the wound bed. The periwound skin appearance exhibited: Dry/Scaly. The periwound skin appearance did not exhibit: Callus, Crepitus, Excoriation, Fluctuance, Friable, Induration, Localized Edema, Rash, Scarring, Maceration, Moist, Atrophie Blanche, Cyanosis, Ecchymosis, Hemosiderin Staining, Mottled, Pallor, Rubor, Erythema. Periwound temperature was noted as No Abnormality. Wound #5 status is Open. Original cause of wound was Gradually Appeared. The wound is located on the Right,Medial Calcaneus. The wound measures 1.1cm length x 1.5cm width x 0.1cm depth; 1.296cm^2 area and 0.13cm^3 volume. The wound is limited to skin breakdown. There is no tunneling or undermining noted. There is a large amount of serosanguineous drainage noted. The wound margin is distinct with the outline attached to the wound base. There is large (67-100%) pink, pale granulation within the wound bed. There is a small (1-33%) amount of necrotic tissue within the wound bed including Adherent Slough. The periwound skin appearance exhibited: Moist. The periwound skin appearance did not exhibit: Callus,  Crepitus, Excoriation, Fluctuance, Friable, Induration, Localized Edema, Rash, Scarring, Dry/Scaly, Maceration, Atrophie Blanche, Cyanosis, Ecchymosis, Hemosiderin Staining, Mottled, Pallor, Rubor, Erythema. Periwound temperature was noted as No Abnormality. The periwound has tenderness on palpation. Assessment Active Problems ICD-10 E11.621 - Type 2 diabetes mellitus with foot ulcer L89.613 - Pressure ulcer of right heel, stage 3 L97.212 - Non-pressure chronic ulcer of right calf with fat layer  exposed L97.211 - Non-pressure chronic ulcer of right calf limited to breakdown of skin Z79.01 - Long term (current) use of anticoagulants Procedures Wound #5 Wound #5 is a Diabetic Wound/Ulcer of the Lower Extremity located on the Right,Medial Calcaneus . There was a Skin/Subcutaneous Tissue Debridement [id number]) debridement with total area of 1.65 sq cm performed by Madison Fudge, MD. with the following instrument(s): Curette including Exudate, Fibrin/Slough, and Subcutaneous after achieving pain control using Lidocaine 4% Topical Solution. A time out was conducted at 15:22, prior to the start of the procedure. A Minimum amount of bleeding was controlled with [name], [name]. ([id number]) Pressure. The procedure was tolerated well with a pain level of 0 throughout and a pain level of 0 following the procedure. Post Debridement Measurements: 1.1cm length x 1.5cm width x 0.1cm depth; 0.13cm^3 volume. Character of Wound/Ulcer Post Debridement is stable. Severity of Tissue Post Debridement is: Fat layer exposed. Post procedure Diagnosis Wound #5: Same as Pre-Procedure Plan Wound Cleansing: Wound #4 Right,Medial Lower Leg: Clean wound with Normal Saline. - in clinic Cleanse wound with mild soap and water Wound #5 Right,Medial Calcaneus: Clean wound with Normal Saline. - in clinic Cleanse wound with mild soap and water Anesthetic: Wound #4 Right,Medial Lower Leg: Topical Lidocaine 4% cream applied to wound bed prior to debridement Wound #5 Right,Medial Calcaneus: Topical Lidocaine 4% cream applied to wound bed prior to debridement Primary Wound Dressing: Wound #4 Right,Medial Lower Leg: Santyl Ointment Secondary Dressing: Wound #4 Right,Medial Lower Leg: Gauze and Kerlix/Conform Wound #5 Right,Medial Calcaneus: Dry Gauze Foam - foam heel cup Dressing Change Frequency: Wound #4 Right,Medial Lower Leg: Change dressing every day. Wound #5 Right,Medial Calcaneus: Change  dressing every day. Follow-up Appointments: Return Appointment in 1 week. Off-Loading: Other: - float heels with pillow under calves when in bed; wear pressure relieving boots while in bed Additional Orders / Instructions: Wound #4 Right,Medial Lower Leg: Increase protein intake. Activity as tolerated Wound #5 Right,Medial Calcaneus: Increase protein intake. [name] ([id number]) Activity as tolerated I have recommended: 1. Santyl ointment to the wounds on the right heel. 2. The right lower extremity to be covered with a bordered foam. 3. Good control of her diabetes 4. Adequate nutrition with protein supplements, vitamin A, vitamin C and zinc 5. Regular visits to the wound center Electronic Signature(s) Signed: 02/03/2016 3:33:41 PM By: Madison Fudge MD, FACS Entered By: [name] on 02/03/2016 15:33:41 [name] ([id number]) -------------------------------------------------------------------------------- SuperBill Details [name] Name: [name] Date of Service: 02/03/2016 Medical Record Number: [id number] [name] Account Number: [id number] Date of Birth/Sex: 27-Dec-1935 (80 y.o. Female) Treating [name]: [name] Primary Care Physician: [name] Other Clinician: Referring Physician: Halina [name] Treating Physician/Extender: Frann Rider in Treatment: 9 Diagnosis Coding ICD-10 Codes Code Description E11.621 Type 2 diabetes mellitus with foot ulcer L89.613 Pressure ulcer of right heel, stage 3 L97.212 Non-pressure chronic ulcer of right calf with fat layer exposed L97.211 Non-pressure chronic ulcer of right calf limited to breakdown of skin Z79.01 Long term (current) use of anticoagulants Facility Procedures CPT4 Code: [id number] Description: 11042 - DEB SUBQ TISSUE 20  SQ CM/< ICD-10 Description Diagnosis E11.621 Type 2 diabetes mellitus with foot ulcer L89.613 Pressure ulcer of right heel, stage 3 L97.212 Non-pressure chronic ulcer of  right calf with fat Modifier: layer exposed Quantity: 1 Physician Procedures CPT4 Code: [id number] Description: [id number] - WC PHYS SUBQ TISS 20 SQ CM ICD-10 Description Diagnosis E11.621 Type 2 diabetes mellitus with foot ulcer L89.613 Pressure ulcer of right heel, stage 3 L97.212 Non-pressure chronic ulcer of right calf with fat Modifier: layer exposed Quantity: 1 Electronic Signature(s) Signed: 02/03/2016 3:34:10 PM By: Madison Fudge MD, FACS Entered By: [name] on 02/03/2016 15:34:10

## 2016-02-04 NOTE — Progress Notes (Signed)
CLORISSA, LAVIOLA ([id number]) Visit Report for 02/03/2016 Arrival Information Details Patient Name: [name], [name]. Date of Service: 02/03/2016 3:00 PM Medical Record Number: [id number] Patient Account Number: [id number] Date of Birth/Sex: November 10, 1935 (79 y.o. Female) Treating RN: Cornell Barman Primary Care Physician: Halina Maidens Other Clinician: Referring Physician: Halina Maidens Treating Physician/Extender: Frann Rider in Treatment: 9 Visit Information History Since Last Visit Added or deleted any medications: No Patient Arrived: Wheel Chair Any new allergies or adverse reactions: No Arrival Time: 15:03 Had a fall or experienced change in No Accompanied By: husband and activities of daily living that may affect caregiver risk of falls: Transfer Assistance: Manual Signs or symptoms of abuse/neglect since last No Patient Identification Verified: Yes visito Secondary Verification Process Yes Hospitalized since last visit: No Completed: Has Dressing in Place as Prescribed: Yes Patient Requires Transmission- No Pain Present Now: No Based Precautions: Patient Has Alerts: Yes Patient Alerts: Patient on Blood Thinner Electronic Signature(s) Signed: 02/03/2016 4:59:47 PM By: Gretta Cool, RN, BSN, Kim RN, BSN Entered By: Gretta Cool, RN, BSN, Kim on 02/03/2016 15:04:06 [name] ([id number]) -------------------------------------------------------------------------------- Encounter Discharge Information Details Patient Name: [name]. Date of Service: 02/03/2016 3:00 PM Medical Record Number: [id number] Patient Account Number: [id number] Date of Birth/Sex: 06/09/1936 (79 y.o. Female) Treating RN: Cornell Barman Primary Care Physician: Halina Maidens Other Clinician: Referring Physician: Halina Maidens Treating Physician/Extender: Frann Rider in Treatment: 9 Encounter Discharge Information Items Discharge Pain Level: 0 Discharge Condition:  Stable Ambulatory Status: Wheelchair Discharge Destination: Home Transportation: Private Auto husband and Accompanied By: caregiver Schedule Follow-up Appointment: Yes Medication Reconciliation completed and provided to Patient/Care Yes Yeily Link: Provided on Clinical Summary of Care: 02/03/2016 Form Type Recipient Paper Patient LK Electronic Signature(s) Signed: 02/03/2016 4:59:47 PM By: Gretta Cool RN, BSN, Kim RN, BSN Previous Signature: 02/03/2016 3:33:11 PM Version By: Ruthine Dose Entered By: Gretta Cool RN, BSN, Kim on 02/03/2016 15:36:49 [name] ([id number]) -------------------------------------------------------------------------------- Lower Extremity Assessment Details Patient Name: [name] Date of Service: 02/03/2016 3:00 PM Medical Record Number: [id number] Patient Account Number: [id number] Date of Birth/Sex: 05/16/1936 (79 y.o. Female) Treating RN: Cornell Barman Primary Care Physician: Halina Maidens Other Clinician: Referring Physician: Halina Maidens Treating Physician/Extender: Frann Rider in Treatment: 9 Vascular Assessment Pulses: Posterior Tibial Dorsalis Pedis Palpable: [Right:Yes] Extremity colors, hair growth, and conditions: Extremity Color: [Right:Normal] Hair Growth on Extremity: [Right:No] Temperature of Extremity: [Right:Warm] Capillary Refill: [Right:< 3 seconds] Lipodermatosclerosis: [Right:No] Toe Nail Assessment Left: Right: Thick: No Discolored: No Deformed: No Improper Length and Hygiene: No Electronic Signature(s) Signed: 02/03/2016 4:59:47 PM By: Gretta Cool, RN, BSN, Kim RN, BSN Entered By: Gretta Cool, RN, BSN, Kim on 02/03/2016 15:17:59 [name] ([id number]) -------------------------------------------------------------------------------- Multi Wound Chart Details Patient Name: [name]. Date of Service: 02/03/2016 3:00 PM Medical Record Number: [id number] Patient Account Number: [id number] Date of  Birth/Sex: 1936/03/10 (79 y.o. Female) Treating RN: Baruch Gouty, RN, BSN, [name] Primary Care Physician: Halina Maidens Other Clinician: Referring Physician: Halina Maidens Treating Physician/Extender: Frann Rider in Treatment: 9 Vital Signs Height(in): 64 Pulse(bpm): 57 Weight(lbs): 110 Blood Pressure 140/55 (mmHg): Body Mass Index(BMI): 19 Temperature(F): 97.8 Respiratory Rate 16 (breaths/min): Photos: [4:No Photos] [N/A:N/A] Wound Location: Right Lower Leg - Medial Right Calcaneus - Medial N/A Wounding Event: Gradually Appeared Gradually Appeared N/A Primary Etiology: Diabetic Wound/Ulcer of Diabetic Wound/Ulcer of N/A the Lower Extremity the Lower Extremity Comorbid History: Chronic Obstructive Chronic Obstructive N/A Pulmonary Disease Pulmonary Disease (COPD), Angina, (COPD), Angina, Congestive Heart Failure, Congestive Heart Failure, Type II Diabetes,  Lupus Type II Diabetes, Lupus Erythematosus, Erythematosus, Osteoarthritis, Dementia Osteoarthritis, Dementia Date Acquired: 11/03/2015 11/03/2015 N/A Weeks of Treatment: 9 9 N/A Wound Status: Open Open N/A Measurements L x W x D 0.1x0.2x0.1 1.1x1.5x0.1 N/A (cm) Area (cm) : 0.016 1.296 N/A Volume (cm) : 0.002 0.13 N/A % Reduction in Area: 99.30% 86.40% N/A % Reduction in Volume: 99.20% 86.40% N/A Classification: Grade 1 Grade 1 N/A Exudate Amount: None Present Large N/A Exudate Type: N/A Serosanguineous N/A KEWANA, MIARS ([id number]) Exudate Color: N/A red, brown N/A Foul Odor After No Yes N/A Cleansing: Odor Anticipated Due to N/A No N/A Product Use: Wound Margin: Distinct, outline attached Distinct, outline attached N/A Granulation Amount: None Present (0%) Large (67-100%) N/A Granulation Quality: N/A Pink, Pale N/A Necrotic Amount: Large (67-100%) Small (1-33%) N/A Necrotic Tissue: Eschar Adherent Slough N/A Exposed Structures: Fascia: No Fascia: No N/A Fat: No Fat: No Tendon: No Tendon:  No Muscle: No Muscle: No Joint: No Joint: No Bone: No Bone: No Limited to Skin Limited to Skin Breakdown Breakdown Epithelialization: Large (67-100%) Medium (34-66%) N/A Periwound Skin Texture: Edema: Yes Edema: No N/A Excoriation: No Excoriation: No Induration: No Induration: No Callus: No Callus: No Crepitus: No Crepitus: No Fluctuance: No Fluctuance: No Friable: No Friable: No Rash: No Rash: No Scarring: No Scarring: No Periwound Skin Dry/Scaly: Yes Moist: Yes N/A Moisture: Maceration: No Maceration: No Moist: No Dry/Scaly: No Periwound Skin Color: Erythema: Yes Atrophie Blanche: No N/A Atrophie Blanche: No Cyanosis: No Cyanosis: No Ecchymosis: No Ecchymosis: No Erythema: No Hemosiderin Staining: No Hemosiderin Staining: No Mottled: No Mottled: No Pallor: No Pallor: No Rubor: No Rubor: No Erythema Location: Circumferential N/A N/A Temperature: No Abnormality No Abnormality N/A Tenderness on Yes Yes N/A Palpation: Wound Preparation: Ulcer Cleansing: Ulcer Cleansing: N/A Rinsed/Irrigated with Rinsed/Irrigated with Saline Saline Topical Anesthetic Topical Anesthetic Applied: None Applied: Other: lidocaine 4% DEZMARIE, CIVELLO ([id number]) Treatment Notes Electronic Signature(s) Signed: 02/03/2016 5:25:53 PM By: Regan Lemming BSN, RN Entered By: Regan Lemming on 02/03/2016 15:23:15 [name] ([id number]) -------------------------------------------------------------------------------- [place] Details Patient Name: [name] Date of Service: 02/03/2016 3:00 PM Medical Record Number: [id number] Patient Account Number: [id number] Date of Birth/Sex: [birth year] (79 y.o. Female) Treating RN: Baruch Gouty, RN, BSN, [name] Primary Care Physician: Halina Maidens Other Clinician: Referring Physician: Halina Maidens Treating Physician/Extender: Frann Rider in Treatment: 9 Active Inactive Abuse / Safety / Falls / Self  Care Management Nursing Diagnoses: Potential for falls Goals: Patient/caregiver will verbalize understanding of skin care regimen Date Initiated: 12/02/2015 Goal Status: Active Patient/caregiver will verbalize/demonstrate measures taken to prevent injury and/or falls Date Initiated: 12/02/2015 Goal Status: Active Interventions: Assess fall risk on admission and as needed Assess: immobility, friction, shearing, incontinence upon admission and as needed Assess impairment of mobility on admission and as needed per policy Assess self care needs on admission and as needed Provide education on fall prevention Provide education on safe transfers Notes: Wound/Skin Impairment Nursing Diagnoses: Impaired tissue integrity Goals: Patient/caregiver will verbalize understanding of skin care regimen Date Initiated: 12/02/2015 Goal Status: Active Interventions: Assess patient/caregiver ability to obtain necessary supplies Assess patient/caregiver ability to perform ulcer/skin care regimen upon admission and as needed JULISHA, PIHL ([id number]) Assess ulceration(s) every visit Provide education on ulcer and skin care Notes: Electronic Signature(s) Signed: 02/03/2016 5:25:53 PM By: Regan Lemming BSN, RN Entered By: Regan Lemming on 02/03/2016 15:22:52 [name] ([id number]) -------------------------------------------------------------------------------- Pain Assessment Details Patient Name: [name] Date of Service: 02/03/2016 3:00 PM Medical Record  Number: [id number] Patient Account Number: [id number] Date of Birth/Sex: April 25, 1936 (79 y.o. Female) Treating RN: Cornell Barman Primary Care Physician: Halina Maidens Other Clinician: Referring Physician: Halina Maidens Treating Physician/Extender: Frann Rider in Treatment: 9 Active Problems Location of Pain Severity and Description of Pain Patient Has Paino No Site Locations With Dressing Change: No Pain Management and  Medication Current Pain Management: Electronic Signature(s) Signed: 02/03/2016 4:59:47 PM By: Gretta Cool, RN, BSN, Kim RN, BSN Entered By: Gretta Cool, RN, BSN, Kim on 02/03/2016 15:05:58 [name] ([id number]) -------------------------------------------------------------------------------- Patient/Caregiver Education Details Patient Name: [name] Date of Service: 02/03/2016 3:00 PM Medical Record Number: [id number] Patient Account Number: [id number] Date of Birth/Gender: 1936-03-24 (80 y.o. Female) Treating RN: Cornell Barman Primary Care Physician: Halina Maidens Other Clinician: Referring Physician: Halina Maidens Treating Physician/Extender: Frann Rider in Treatment: 9 Education Assessment Education Provided To: Patient Education Topics Provided Wound/Skin Impairment: Handouts: Caring for Your Ulcer Methods: Demonstration, Explain/Verbal Responses: State content correctly Electronic Signature(s) Signed: 02/03/2016 4:59:47 PM By: Gretta Cool, RN, BSN, Kim RN, BSN Entered By: Gretta Cool, RN, BSN, Kim on 02/03/2016 15:37:02 [name] ([id number]) -------------------------------------------------------------------------------- Wound Assessment Details Patient Name: [name] Date of Service: 02/03/2016 3:00 PM Medical Record Number: [id number] Patient Account Number: [id number] Date of Birth/Sex: 1935/07/04 (79 y.o. Female) Treating RN: Afful, RN, BSN, [place] Primary Care Physician: Halina Maidens Other Clinician: Referring Physician: Halina Maidens Treating Physician/Extender: Frann Rider in Treatment: 9 Wound Status Wound Number: 4 Primary Diabetic Wound/Ulcer of the Lower Etiology: Extremity Wound Location: Right Lower Leg - Medial Wound Healed - Epithelialized Wounding Event: Gradually Appeared Status: Date Acquired: 11/03/2015 Comorbid Chronic Obstructive Pulmonary Disease Weeks Of Treatment: 9 History: (COPD), Angina, Congestive  Heart Clustered Wound: No Failure, Type II Diabetes, Lupus Erythematosus, Osteoarthritis, Dementia Wound Measurements Length: (cm) 0 % Reducti Width: (cm) 0 % Reducti Depth: (cm) 0 Epithelia Area: (cm) 0 Tunnelin Volume: (cm) 0 Undermin on in Area: 100% on in Volume: 100% lization: Large (67-100%) g: No ing: No Wound Description Classification: Grade 1 Wound Margin: Distinct, outline attached Exudate Amount: None Present Foul Odor After Cleansing: No Wound Bed Granulation Amount: None Present (0%) Exposed Structure Necrotic Amount: None Present (0%) Fascia Exposed: No Fat Layer Exposed: No Tendon Exposed: No Muscle Exposed: No Joint Exposed: No Bone Exposed: No Limited to Skin Breakdown Periwound Skin Texture Texture Color No Abnormalities Noted: No No Abnormalities Noted: No Callus: No Atrophie Blanche: No Crepitus: No Cyanosis: No Excoriation: No Ecchymosis: No Fluctuance: No Erythema: No LASHARA, MILLS ([id number]) Friable: No Hemosiderin Staining: No Induration: No Mottled: No Localized Edema: No Pallor: No Rash: No Rubor: No Scarring: No Temperature / Pain Moisture Temperature: No Abnormality No Abnormalities Noted: No Dry / Scaly: Yes Maceration: No Moist: No Wound Preparation Ulcer Cleansing: Rinsed/Irrigated with Saline Topical Anesthetic Applied: None Electronic Signature(s) Signed: 02/03/2016 5:25:53 PM By: Regan Lemming BSN, RN Entered By: Regan Lemming on 02/03/2016 15:27:29 [name] ([id number]) -------------------------------------------------------------------------------- Wound Assessment Details Patient Name: [name] Date of Service: 02/03/2016 3:00 PM Medical Record Number: [id number] Patient Account Number: [id number] Date of Birth/Sex: 1936/04/16 (79 y.o. Female) Treating RN: Cornell Barman Primary Care Physician: Halina Maidens Other Clinician: Referring Physician: Halina Maidens Treating Physician/Extender:  Frann Rider in Treatment: 9 Wound Status Wound Number: 5 Primary Diabetic Wound/Ulcer of the Lower Etiology: Extremity Wound Location: Right Calcaneus - Medial Wound Open Wounding Event: Gradually Appeared Status: Date Acquired: 11/03/2015 Comorbid Chronic Obstructive Pulmonary Disease Weeks Of Treatment: 9 History: (COPD),  Angina, Congestive Heart Clustered Wound: No Failure, Type II Diabetes, Lupus Erythematosus, Osteoarthritis, Dementia Photos Wound Measurements Length: (cm) 1.1 Width: (cm) 1.5 Depth: (cm) 0.1 Area: (cm) 1.296 Volume: (cm) 0.13 % Reduction in Area: 86.4% % Reduction in Volume: 86.4% Epithelialization: Medium (34-66%) Tunneling: No Undermining: No Wound Description Classification: Grade 1 Wound Margin: Distinct, outline attached Exudate Amount: Large Exudate Type: Serosanguineous Exudate Color: red, brown Foul Odor After Cleansing: Yes Due to Product Use: No Wound Bed Granulation Amount: Large (67-100%) Exposed Structure Granulation Quality: Pink, Pale Fascia Exposed: No Necrotic Amount: Small (1-33%) Fat Layer Exposed: No Necrotic Quality: Adherent Slough Tendon Exposed: No SUMMERLYNN, MOREHART ([id number]) Muscle Exposed: No Joint Exposed: No Bone Exposed: No Limited to Skin Breakdown Periwound Skin Texture Texture Color No Abnormalities Noted: No No Abnormalities Noted: No Callus: No Atrophie Blanche: No Crepitus: No Cyanosis: No Excoriation: No Ecchymosis: No Fluctuance: No Erythema: No Friable: No Hemosiderin Staining: No Induration: No Mottled: No Localized Edema: No Pallor: No Rash: No Rubor: No Scarring: No Temperature / Pain Moisture Temperature: No Abnormality No Abnormalities Noted: No Tenderness on Palpation: Yes Dry / Scaly: No Maceration: No Moist: Yes Wound Preparation Ulcer Cleansing: Rinsed/Irrigated with Saline Topical Anesthetic Applied: Other: lidocaine 4%, Treatment Notes Wound #5 (Right,  Medial Calcaneus) 1. Cleansed with: Clean wound with Normal Saline 2. Anesthetic Topical Lidocaine 4% cream to wound bed prior to debridement 4. Dressing Applied: Santyl Ointment 5. Secondary Dressing Applied Bordered Foam Dressing Electronic Signature(s) Signed: 02/03/2016 4:59:47 PM By: Gretta Cool, RN, BSN, Kim RN, BSN Entered By: Gretta Cool, RN, BSN, Kim on 02/03/2016 15:16:24 [name] ([id number]) -------------------------------------------------------------------------------- [place] Details Patient Name: [name] Date of Service: 02/03/2016 3:00 PM Medical Record Number: [id number] Patient Account Number: [id number] Date of Birth/Sex: Nov 25, 1935 (79 y.o. Female) Treating RN: Cornell Barman Primary Care Physician: Halina Maidens Other Clinician: Referring Physician: Halina Maidens Treating Physician/Extender: Frann Rider in Treatment: 9 Vital Signs Time Taken: 15:06 Temperature (F): 97.8 Height (in): 64 Pulse (bpm): 57 Weight (lbs): 110 Respiratory Rate (breaths/min): 16 Body Mass Index (BMI): 18.9 Blood Pressure (mmHg): 140/55 Reference Range: 80 - 120 mg / dl Electronic Signature(s) Signed: 02/03/2016 4:59:47 PM By: Gretta Cool, RN, BSN, Kim RN, BSN Entered By: Gretta Cool, RN, BSN, Kim on 02/03/2016 15:07:23

## 2016-02-07 DIAGNOSIS — I5032 Chronic diastolic (congestive) heart failure: Secondary | ICD-10-CM | POA: Diagnosis not present

## 2016-02-07 DIAGNOSIS — F0390 Unspecified dementia without behavioral disturbance: Secondary | ICD-10-CM | POA: Diagnosis not present

## 2016-02-07 DIAGNOSIS — L89614 Pressure ulcer of right heel, stage 4: Secondary | ICD-10-CM | POA: Diagnosis not present

## 2016-02-07 DIAGNOSIS — J449 Chronic obstructive pulmonary disease, unspecified: Secondary | ICD-10-CM | POA: Diagnosis not present

## 2016-02-07 DIAGNOSIS — E1142 Type 2 diabetes mellitus with diabetic polyneuropathy: Secondary | ICD-10-CM | POA: Diagnosis not present

## 2016-02-07 DIAGNOSIS — S72001D Fracture of unspecified part of neck of right femur, subsequent encounter for closed fracture with routine healing: Secondary | ICD-10-CM | POA: Diagnosis not present

## 2016-02-08 ENCOUNTER — Encounter (INDEPENDENT_AMBULATORY_CARE_PROVIDER_SITE_OTHER): Payer: Medicare Other | Admitting: Internal Medicine

## 2016-02-08 DIAGNOSIS — S72001D Fracture of unspecified part of neck of right femur, subsequent encounter for closed fracture with routine healing: Secondary | ICD-10-CM | POA: Diagnosis not present

## 2016-02-08 DIAGNOSIS — L89613 Pressure ulcer of right heel, stage 3: Secondary | ICD-10-CM | POA: Diagnosis not present

## 2016-02-08 DIAGNOSIS — E1142 Type 2 diabetes mellitus with diabetic polyneuropathy: Secondary | ICD-10-CM | POA: Diagnosis not present

## 2016-02-08 DIAGNOSIS — R482 Apraxia: Secondary | ICD-10-CM

## 2016-02-08 DIAGNOSIS — S72002D Fracture of unspecified part of neck of left femur, subsequent encounter for closed fracture with routine healing: Secondary | ICD-10-CM

## 2016-02-08 DIAGNOSIS — F419 Anxiety disorder, unspecified: Secondary | ICD-10-CM

## 2016-02-08 DIAGNOSIS — L89614 Pressure ulcer of right heel, stage 4: Secondary | ICD-10-CM | POA: Diagnosis not present

## 2016-02-08 DIAGNOSIS — G309 Alzheimer's disease, unspecified: Secondary | ICD-10-CM

## 2016-02-08 DIAGNOSIS — I5032 Chronic diastolic (congestive) heart failure: Secondary | ICD-10-CM | POA: Diagnosis not present

## 2016-02-08 DIAGNOSIS — M329 Systemic lupus erythematosus, unspecified: Secondary | ICD-10-CM

## 2016-02-08 DIAGNOSIS — J9611 Chronic respiratory failure with hypoxia: Secondary | ICD-10-CM

## 2016-02-08 DIAGNOSIS — F0390 Unspecified dementia without behavioral disturbance: Secondary | ICD-10-CM | POA: Diagnosis not present

## 2016-02-08 DIAGNOSIS — J449 Chronic obstructive pulmonary disease, unspecified: Secondary | ICD-10-CM | POA: Diagnosis not present

## 2016-02-08 DIAGNOSIS — I480 Paroxysmal atrial fibrillation: Secondary | ICD-10-CM

## 2016-02-08 DIAGNOSIS — F0150 Vascular dementia without behavioral disturbance: Secondary | ICD-10-CM

## 2016-02-08 DIAGNOSIS — F0280 Dementia in other diseases classified elsewhere without behavioral disturbance: Secondary | ICD-10-CM

## 2016-02-08 NOTE — Progress Notes (Signed)
Received home health orders from [facility]. Start of care date 11/27/2015.  Orders are recertification of care with start date 01/26/2016 through 03/25/2016. Home health orders are reviewed signed dated and faxed.

## 2016-02-09 DIAGNOSIS — I5032 Chronic diastolic (congestive) heart failure: Secondary | ICD-10-CM | POA: Diagnosis not present

## 2016-02-09 DIAGNOSIS — F0390 Unspecified dementia without behavioral disturbance: Secondary | ICD-10-CM | POA: Diagnosis not present

## 2016-02-09 DIAGNOSIS — E1142 Type 2 diabetes mellitus with diabetic polyneuropathy: Secondary | ICD-10-CM | POA: Diagnosis not present

## 2016-02-09 DIAGNOSIS — L89614 Pressure ulcer of right heel, stage 4: Secondary | ICD-10-CM | POA: Diagnosis not present

## 2016-02-09 DIAGNOSIS — S72001D Fracture of unspecified part of neck of right femur, subsequent encounter for closed fracture with routine healing: Secondary | ICD-10-CM | POA: Diagnosis not present

## 2016-02-09 DIAGNOSIS — J449 Chronic obstructive pulmonary disease, unspecified: Secondary | ICD-10-CM | POA: Diagnosis not present

## 2016-02-10 ENCOUNTER — Encounter: Payer: Medicare Other | Admitting: Surgery

## 2016-02-10 DIAGNOSIS — L97212 Non-pressure chronic ulcer of right calf with fat layer exposed: Secondary | ICD-10-CM | POA: Diagnosis not present

## 2016-02-10 DIAGNOSIS — J449 Chronic obstructive pulmonary disease, unspecified: Secondary | ICD-10-CM | POA: Diagnosis not present

## 2016-02-10 DIAGNOSIS — L89613 Pressure ulcer of right heel, stage 3: Secondary | ICD-10-CM | POA: Diagnosis not present

## 2016-02-10 DIAGNOSIS — Z7901 Long term (current) use of anticoagulants: Secondary | ICD-10-CM | POA: Diagnosis not present

## 2016-02-10 DIAGNOSIS — L97211 Non-pressure chronic ulcer of right calf limited to breakdown of skin: Secondary | ICD-10-CM | POA: Diagnosis not present

## 2016-02-10 DIAGNOSIS — L97411 Non-pressure chronic ulcer of right heel and midfoot limited to breakdown of skin: Secondary | ICD-10-CM | POA: Diagnosis not present

## 2016-02-10 DIAGNOSIS — E11621 Type 2 diabetes mellitus with foot ulcer: Secondary | ICD-10-CM | POA: Diagnosis not present

## 2016-02-11 DIAGNOSIS — E1142 Type 2 diabetes mellitus with diabetic polyneuropathy: Secondary | ICD-10-CM | POA: Diagnosis not present

## 2016-02-11 DIAGNOSIS — I5032 Chronic diastolic (congestive) heart failure: Secondary | ICD-10-CM | POA: Diagnosis not present

## 2016-02-11 DIAGNOSIS — F0390 Unspecified dementia without behavioral disturbance: Secondary | ICD-10-CM | POA: Diagnosis not present

## 2016-02-11 DIAGNOSIS — L89614 Pressure ulcer of right heel, stage 4: Secondary | ICD-10-CM | POA: Diagnosis not present

## 2016-02-11 DIAGNOSIS — S72001D Fracture of unspecified part of neck of right femur, subsequent encounter for closed fracture with routine healing: Secondary | ICD-10-CM | POA: Diagnosis not present

## 2016-02-11 DIAGNOSIS — J449 Chronic obstructive pulmonary disease, unspecified: Secondary | ICD-10-CM | POA: Diagnosis not present

## 2016-02-11 NOTE — Progress Notes (Signed)
EIRENE, LLERA ([id number]) Visit Report for 02/10/2016 Arrival Information Details Patient Name: [name], [name]. Date of Service: 02/10/2016 3:00 PM Medical Record Number: [id number] Patient Account Number: [id number] Date of Birth/Sex: 05/06/1936 (80 y.o. Female) Treating RN: Cornell Barman Primary Care Physician: Halina Maidens Other Clinician: Referring Physician: Halina Maidens Treating Physician/Extender: Frann Rider in Treatment: 10 Visit Information History Since Last Visit Added or deleted any medications: No Patient Arrived: Wheel Chair Any new allergies or adverse reactions: No Arrival Time: 15:00 Had a fall or experienced change in No Accompanied By: daughter and activities of daily living that may affect caregiver risk of falls: Transfer Assistance: Manual Signs or symptoms of abuse/neglect since last No Patient Identification Verified: Yes visito Secondary Verification Process Yes Hospitalized since last visit: No Completed: Has Dressing in Place as Prescribed: Yes Patient Requires Transmission- No Pain Present Now: No Based Precautions: Patient Has Alerts: Yes Patient Alerts: Patient on Blood Thinner Electronic Signature(s) Signed: 02/10/2016 4:46:05 PM By: Gretta Cool, RN, BSN, Kim RN, BSN Entered By: Gretta Cool, RN, BSN, Kim on 02/10/2016 15:00:53 [name] ([id number]) -------------------------------------------------------------------------------- Encounter Discharge Information Details Patient Name: [name]. Date of Service: 02/10/2016 3:00 PM Medical Record Number: [id number] Patient Account Number: [id number] Date of Birth/Sex: 1936-05-21 (80 y.o. Female) Treating RN: Cornell Barman Primary Care Physician: Halina Maidens Other Clinician: Referring Physician: Halina Maidens Treating Physician/Extender: Frann Rider in Treatment: 10 Encounter Discharge Information Items Discharge Pain Level: 0 Discharge Condition:  Stable Ambulatory Status: Wheelchair Discharge Destination: Home Transportation: Private Auto daughter and Accompanied By: caregiver Schedule Follow-up Appointment: Yes Medication Reconciliation completed and provided to Patient/Care Yes Nuria Phebus: Provided on Clinical Summary of Care: 02/10/2016 Form Type Recipient Paper Patient LK Electronic Signature(s) Signed: 02/10/2016 4:46:05 PM By: Gretta Cool RN, BSN, Kim RN, BSN Previous Signature: 02/10/2016 3:30:25 PM Version By: Ruthine Dose Entered By: Gretta Cool RN, BSN, Kim on 02/10/2016 15:35:13 [name] ([id number]) -------------------------------------------------------------------------------- Lower Extremity Assessment Details Patient Name: [name] Date of Service: 02/10/2016 3:00 PM Medical Record Number: [id number] Patient Account Number: [id number] Date of Birth/Sex: 1936/03/05 (80 y.o. Female) Treating RN: Cornell Barman Primary Care Physician: Halina Maidens Other Clinician: Referring Physician: Halina Maidens Treating Physician/Extender: Frann Rider in Treatment: 10 Vascular Assessment Pulses: Posterior Tibial Dorsalis Pedis Palpable: [Right:Yes] Extremity colors, hair growth, and conditions: Extremity Color: [Right:Normal] Hair Growth on Extremity: [Right:No] Temperature of Extremity: [Right:Warm] Capillary Refill: [Right:< 3 seconds] Toe Nail Assessment Left: Right: Thick: No Discolored: No Deformed: No Improper Length and Hygiene: No Electronic Signature(s) Signed: 02/10/2016 4:46:05 PM By: Gretta Cool, RN, BSN, Kim RN, BSN Entered By: Gretta Cool, RN, BSN, Kim on 02/10/2016 15:09:46 [name] ([id number]) -------------------------------------------------------------------------------- Multi Wound Chart Details Patient Name: [name]. Date of Service: 02/10/2016 3:00 PM Medical Record Number: [id number] Patient Account Number: [id number] Date of Birth/Sex: 09/28/1935 (80 y.o.  Female) Treating RN: Baruch Gouty, RN, BSN, [name] Primary Care Physician: Halina Maidens Other Clinician: Referring Physician: Halina Maidens Treating Physician/Extender: Frann Rider in Treatment: 10 Vital Signs Height(in): 64 Pulse(bpm): 59 Weight(lbs): 110 Blood Pressure 141/50 (mmHg): Body Mass Index(BMI): 19 Temperature(F): 97.6 Respiratory Rate 18 (breaths/min): Photos: [N/A:N/A] Wound Location: Right Calcaneus - Medial N/A N/A Wounding Event: Gradually Appeared N/A N/A Primary Etiology: Diabetic Wound/Ulcer of N/A N/A the Lower Extremity Comorbid History: Chronic Obstructive N/A N/A Pulmonary Disease (COPD), Angina, Congestive Heart Failure, Type II Diabetes, Lupus Erythematosus, Osteoarthritis, Dementia Date Acquired: 11/03/2015 N/A N/A Weeks of Treatment: 10 N/A N/A Wound Status: Open N/A N/A Measurements L  x W x D 0.9x0.9x0.1 N/A N/A (cm) Area (cm) : 0.636 N/A N/A Volume (cm) : 0.064 N/A N/A % Reduction in Area: 93.30% N/A N/A % Reduction in Volume: 93.30% N/A N/A Classification: Grade 1 N/A N/A Exudate Amount: Large N/A N/A Exudate Type: Serosanguineous N/A N/A ELAYJAH, VIGNEAULT ([id number]) Exudate Color: red, brown N/A N/A Foul Odor After Yes N/A N/A Cleansing: Odor Anticipated Due to No N/A N/A Product Use: Wound Margin: Distinct, outline attached N/A N/A Granulation Amount: Large (67-100%) N/A N/A Granulation Quality: Pink, Pale N/A N/A Necrotic Amount: Small (1-33%) N/A N/A Exposed Structures: Fascia: No N/A N/A Fat: No Tendon: No Muscle: No Joint: No Bone: No Limited to Skin Breakdown Epithelialization: Medium (34-66%) N/A N/A Periwound Skin Texture: Edema: No N/A N/A Excoriation: No Induration: No Callus: No Crepitus: No Fluctuance: No Friable: No Rash: No Scarring: No Periwound Skin Moist: Yes N/A N/A Moisture: Maceration: No Dry/Scaly: No Periwound Skin Color: Atrophie Blanche: No N/A N/A Cyanosis: No Ecchymosis:  No Erythema: No Hemosiderin Staining: No Mottled: No Pallor: No Rubor: No Temperature: No Abnormality N/A N/A Tenderness on Yes N/A N/A Palpation: Wound Preparation: Ulcer Cleansing: N/A N/A Rinsed/Irrigated with Saline Topical Anesthetic Applied: Other: lidocaine 4% Treatment Notes INARI, REHAK ([id number]) Electronic Signature(s) Signed: 02/10/2016 5:32:33 PM By: Regan Lemming BSN, RN Entered By: Regan Lemming on 02/10/2016 15:15:41 [name] ([id number]) -------------------------------------------------------------------------------- [place] Details Patient Name: [name] Date of Service: 02/10/2016 3:00 PM Medical Record Number: [id number] Patient Account Number: [id number] Date of Birth/Sex: 1935-10-09 (80 y.o. Female) Treating RN: Baruch Gouty, RN, BSN, [name] Primary Care Physician: Halina Maidens Other Clinician: Referring Physician: Halina Maidens Treating Physician/Extender: Frann Rider in Treatment: 10 Active Inactive Abuse / Safety / Falls / Self Care Management Nursing Diagnoses: Potential for falls Goals: Patient/caregiver will verbalize understanding of skin care regimen Date Initiated: 12/02/2015 Goal Status: Active Patient/caregiver will verbalize/demonstrate measures taken to prevent injury and/or falls Date Initiated: 12/02/2015 Goal Status: Active Interventions: Assess fall risk on admission and as needed Assess: immobility, friction, shearing, incontinence upon admission and as needed Assess impairment of mobility on admission and as needed per policy Assess self care needs on admission and as needed Provide education on fall prevention Provide education on safe transfers Notes: Wound/Skin Impairment Nursing Diagnoses: Impaired tissue integrity Goals: Patient/caregiver will verbalize understanding of skin care regimen Date Initiated: 12/02/2015 Goal Status: Active Interventions: Assess patient/caregiver  ability to obtain necessary supplies Assess patient/caregiver ability to perform ulcer/skin care regimen upon admission and as needed RANDALYN, ELLERS ([id number]) Assess ulceration(s) every visit Provide education on ulcer and skin care Notes: Electronic Signature(s) Signed: 02/10/2016 5:32:33 PM By: Regan Lemming BSN, RN Entered By: Regan Lemming on 02/10/2016 15:15:11 [name] ([id number]) -------------------------------------------------------------------------------- Non-Wound Condition Assessment Details Patient Name: [name] Date of Service: 02/10/2016 3:00 PM Medical Record Number: [id number] Patient Account Number: [id number] Date of Birth/Sex: [birth year] (80 y.o. Female) Treating RN: Cornell Barman Primary Care Physician: Halina Maidens Other Clinician: Referring Physician: Halina Maidens Treating Physician/Extender: Frann Rider in Treatment: 10 Non-Wound Condition: Condition: Suspected Deep Tissue Injury Location: Other: medial ankle Side: Right Photos Periwound Skin Texture Texture Color No Abnormalities Noted: No No Abnormalities Noted: No Callus: No Atrophie Blanche: No Crepitus: No Cyanosis: No Excoriation: No Ecchymosis: Yes Fluctuance: No Erythema: No Friable: No Hemosiderin Staining: No Induration: No Mottled: No Localized Edema: No Pallor: No Rash: No Rubor: No Scarring: No Temperature / Pain Moisture Temperature: No Abnormality No Abnormalities Noted:  No Dry / Scaly: No Maceration: No Moist: No Electronic Signature(s) Signed: 02/10/2016 4:46:05 PM By: Gretta Cool, RN, BSN, Kim RN, BSN Entered By: Gretta Cool, RN, BSN, Kim on 02/10/2016 15:08:20 [name] ([id number]) [name] ([id number]) -------------------------------------------------------------------------------- Pain Assessment Details Patient Name: JONESHIA, MARCOS. Date of Service: 02/10/2016 3:00 PM Medical Record Number: [id number] Patient Account Number:  [id number] Date of Birth/Sex: 1936/03/21 (80 y.o. Female) Treating RN: Cornell Barman Primary Care Physician: Halina Maidens Other Clinician: Referring Physician: Halina Maidens Treating Physician/Extender: Frann Rider in Treatment: 10 Active Problems Location of Pain Severity and Description of Pain Patient Has Paino No Site Locations With Dressing Change: No Pain Management and Medication Current Pain Management: Electronic Signature(s) Signed: 02/10/2016 4:46:05 PM By: Gretta Cool, RN, BSN, Kim RN, BSN Entered By: Gretta Cool, RN, BSN, Kim on 02/10/2016 15:01:18 [name] ([id number]) -------------------------------------------------------------------------------- Patient/Caregiver Education Details Patient Name: [name] Date of Service: 02/10/2016 3:00 PM Medical Record Number: [id number] Patient Account Number: [id number] Date of Birth/Gender: [birth year] (80 y.o. Female) Treating RN: Cornell Barman Primary Care Physician: Halina Maidens Other Clinician: Referring Physician: Halina Maidens Treating Physician/Extender: Frann Rider in Treatment: 10 Education Assessment Education Provided To: Patient Education Topics Provided Wound/Skin Impairment: Handouts: Caring for Your Ulcer, Other: continue wound care as prescribed Methods: Demonstration, Explain/Verbal Responses: State content correctly Electronic Signature(s) Signed: 02/10/2016 4:46:05 PM By: Gretta Cool, RN, BSN, Kim RN, BSN Entered By: Gretta Cool, RN, BSN, Kim on 02/10/2016 15:35:30 [name] ([id number]) -------------------------------------------------------------------------------- Wound Assessment Details Patient Name: [name]. Date of Service: 02/10/2016 3:00 PM Medical Record Number: [id number] Patient Account Number: [id number] Date of Birth/Sex: 05-Oct-1935 (80 y.o. Female) Treating RN: Cornell Barman Primary Care Physician: Halina Maidens Other Clinician: Referring Physician:  Halina Maidens Treating Physician/Extender: Frann Rider in Treatment: 10 Wound Status Wound Number: 5 Primary Diabetic Wound/Ulcer of the Lower Etiology: Extremity Wound Location: Right Calcaneus - Medial Wound Open Wounding Event: Gradually Appeared Status: Date Acquired: 11/03/2015 Comorbid Chronic Obstructive Pulmonary Disease Weeks Of Treatment: 10 History: (COPD), Angina, Congestive Heart Clustered Wound: No Failure, Type II Diabetes, Lupus Erythematosus, Osteoarthritis, Dementia Photos Wound Measurements Length: (cm) 0.9 Width: (cm) 0.9 Depth: (cm) 0.1 Area: (cm) 0.636 Volume: (cm) 0.064 % Reduction in Area: 93.3% % Reduction in Volume: 93.3% Epithelialization: Medium (34-66%) Tunneling: No Undermining: No Wound Description Classification: Grade 1 Wound Margin: Distinct, outline attached Exudate Amount: Large Exudate Type: Serosanguineous Exudate Color: red, brown Foul Odor After Cleansing: Yes Due to Product Use: No Wound Bed Granulation Amount: Large (67-100%) Exposed Structure Granulation Quality: Pink, Pale Fascia Exposed: No Necrotic Amount: Small (1-33%) Fat Layer Exposed: No Necrotic Quality: Adherent Slough Tendon Exposed: No YESLY, WIMBISH ([id number]) Muscle Exposed: No Joint Exposed: No Bone Exposed: No Limited to Skin Breakdown Periwound Skin Texture Texture Color No Abnormalities Noted: No No Abnormalities Noted: No Callus: No Atrophie Blanche: No Crepitus: No Cyanosis: No Excoriation: No Ecchymosis: No Fluctuance: No Erythema: No Friable: No Hemosiderin Staining: No Induration: No Mottled: No Localized Edema: No Pallor: No Rash: No Rubor: No Scarring: No Temperature / Pain Moisture Temperature: No Abnormality No Abnormalities Noted: No Tenderness on Palpation: Yes Dry / Scaly: No Maceration: No Moist: Yes Wound Preparation Ulcer Cleansing: Rinsed/Irrigated with Saline Topical Anesthetic Applied: Other:  lidocaine 4%, Treatment Notes Wound #5 (Right, Medial Calcaneus) 1. Cleansed with: Clean wound with Normal Saline 2. Anesthetic Topical Lidocaine 4% cream to wound bed prior to debridement 4. Dressing Applied: Prisma Ag 5. Secondary Dressing Applied Gauze and Kerlix/Conform  Notes stretch net Electronic Signature(s) Signed: 02/10/2016 4:46:05 PM By: Gretta Cool, RN, BSN, Kim RN, BSN Entered By: Gretta Cool, RN, BSN, Kim on 02/10/2016 15:05:29 [name] ([id number]) -------------------------------------------------------------------------------- [place] Details Patient Name: [name] Date of Service: 02/10/2016 3:00 PM Medical Record Number: [id number] Patient Account Number: [id number] Date of Birth/Sex: [birth year] (80 y.o. Female) Treating RN: Cornell Barman Primary Care Physician: Halina Maidens Other Clinician: Referring Physician: Halina Maidens Treating Physician/Extender: Frann Rider in Treatment: 10 Vital Signs Time Taken: 15:01 Temperature (F): 97.6 Height (in): 64 Pulse (bpm): 59 Weight (lbs): 110 Respiratory Rate (breaths/min): 18 Body Mass Index (BMI): 18.9 Blood Pressure (mmHg): 141/50 Reference Range: 80 - 120 mg / dl Electronic Signature(s) Signed: 02/10/2016 4:46:05 PM By: Gretta Cool, RN, BSN, Kim RN, BSN Entered By: Gretta Cool, RN, BSN, Kim on 02/10/2016 15:01:45

## 2016-02-11 NOTE — Progress Notes (Signed)
[name], [name] ([id number]) Visit Report for [name] Chief Complaint Document Details Patient Name: [name], [name] [name] 3:00 Date of Service: PM Medical Record [id number] Number: Patient Account Number: [id number] 1935-09-08 (80 y.o. Treating [name]: [name] Date of Birth/Sex: Female) Other Clinician: Primary Care Physician: [name] Treating [name] Referring Physician: Halina [name] Physician/Extender: [name] in Treatment: 10 Information Obtained from: Patient Chief Complaint Patient is at the clinic for treatment of an open pressure ulcer to her right calcaneum which has been there for about 4 weeks Electronic Signature(s) Signed: 02/10/2016 3:24:23 PM By: Madison Fudge MD, FACS Entered By: [name] on 02/10/2016 15:24:23 [name] ([id number]) -------------------------------------------------------------------------------- Debridement Details Patient Name: [name], [name] [name] 3:00 Date of Service: PM Medical Record [id number] Number: Patient Account Number: [id number] 17-Sep-1935 (80 y.o. Treating [name]: [name] Date of Birth/Sex: Female) Other Clinician: Primary Care Physician: [name] Treating [name] Referring Physician: Halina [name] Physician/Extender: [name] in Treatment: 10 Debridement Performed for Wound #5 Right,Medial Calcaneus Assessment: Performed By: Physician Madison Fudge, MD Debridement: Debridement Pre-procedure Yes - 15:16 Verification/Time Out Taken: Start Time: 15:16 Pain Control: Lidocaine 4% Topical Solution Level: Skin/Subcutaneous Tissue Total Area Debrided (L x 0.9 (cm) x 0.9 (cm) = 0.81 (cm) W): Tissue and other Viable, Non-Viable, Exudate, Fibrin/Slough, Subcutaneous material debrided: Instrument: Curette Bleeding: Minimum Hemostasis Achieved: Pressure End Time: 15:21 Procedural Pain: 0 Post Procedural Pain: 0 Response to Treatment: Procedure was tolerated well Post  Debridement Measurements of Total Wound Length: (cm) 0.9 Width: (cm) 0.9 Depth: (cm) 0.1 Volume: (cm) 0.064 Character of Wound/Ulcer Post Improved Debridement: Severity of Tissue Post Debridement: Fat layer exposed Post Procedure Diagnosis Same as Pre-procedure Electronic Signature(s) Signed: 02/10/2016 3:24:14 PM By: Madison Fudge MD, FACS Signed: 02/10/2016 4:46:05 PM By: [name] [name], [name], [name] [name], [name] Gotto, [name] ([id number]) Entered By: [name] on 02/10/2016 15:24:14 [name] ([id number]) -------------------------------------------------------------------------------- HPI Details Patient Name: [name], [name] [name] 3:00 Date of Service: PM Medical Record [id number] Number: Patient Account Number: [id number] [birth year] (80 y.o. Treating [name]: [name] Date of Birth/Sex: Female) Other Clinician: Primary Care Physician: [name] Treating [name] Referring Physician: Halina [name] Physician/Extender: [name] in Treatment: 10 History of Present Illness Location: right posterior heel, and both lower extremities Quality: Patient reports experiencing a sharp pain to affected area(s). Severity: Patient states wound (s) are getting better. Duration: Patient has had the wound for < 4 weeks prior to presenting for treatment Timing: Pain in wound is Intermittent (comes and goes Context: The wound appeared gradually over time while she was in hospital with a right hip fracture Modifying Factors: Consults to this date include:was seen by her PCP and put on some antibiotics a while ago Associated Signs and Symptoms: Patient reports having difficulty standing for long periods. HPI Description: 80 year old patient referred who was recently discharged in April of this year with a left heel ulcer now comes with a right heel ulcer and some ulcerations in both lower extremities which have all come along during her recent hospitalization which she had a  right hip fracture. She has a past medical history of COPD, diabetes mellitus type 2 with chronic kidney disease, multi-infarct dementia, anxiety, urinary retention, paroxysmal atrial fibrillation. She has also had moderate malnutrition, multi-infarct dementia, congestive heart failure, left displaced femoral neck fracture, systemic lupus erythematosus. She is also status total abdominal hysterectomy with bilateral salpingo-oophorectomy, EGDs, hip arthroplasty on the left, cataract surgery. She has been a from a smoker and quit in May  2013 and she smoked for about 40 years. recently admitted to [facility] between January 19 and 07/03/2015 12/09/2015 -- o right foot x-ray -- IMPRESSION:Soft tissue ulceration of the heel. No underlying erosive bony abnormality. Diffuse degenerative change. No acute bony abnormality. 12/30/2015-- admitted to the hospital between July 12 and July 18 of 2017 and was treated for cerebral infarction, infected decubitus ulcer and aspiration pneumonia of the right lung. MRI was negative for stroke and was thought to be secondary to pneumonia. She was treated with clindamycin. Patient was discharged home on Keflex and Zyvox. Dr. Lucky Cowboy took her for an aortogram and a right lower extremity runoff and found to 30% stenosis in the mid to distal SFA but no significant stenosis in the popliteal artery and the SFA. There was a two-vessel runoff through the anterior tibial artery and peroneal artery without significant stenosis. felt her perfusion was adequate for wound healing. Electronic Signature(s) Signed: 02/10/2016 3:24:27 PM By: Madison Fudge MD, FACS Entered By: [name] on 02/10/2016 15:24:27 [name] ([name] ([id number]) -------------------------------------------------------------------------------- Physical Exam Details Patient Name: [name], [name] 3:00 Date of Service: PM Medical  Record [id number] Number: Patient Account Number: [id number] 17-Sep-1935 (80 y.o. Treating [name]: [name] Date of Birth/Sex: Female) Other Clinician: Primary Care Physician: [name] Treating [name] Referring Physician: Halina [name] Physician/Extender: [name] in Treatment: 10 Constitutional . Pulse regular. Respirations normal and unlabored. Afebrile. . Eyes Nonicteric. Reactive to light. Ears, Nose, Mouth, and Throat Lips, teeth, and gums WNL.Marland Kitchen Moist mucosa without lesions. Neck supple and nontender. No palpable supraclavicular or cervical adenopathy. Normal sized without goiter. Respiratory WNL. No retractions.. Cardiovascular Pedal Pulses WNL. No clubbing, cyanosis or edema. Lymphatic No adneopathy. No adenopathy. No adenopathy. Musculoskeletal Adexa without tenderness or enlargement.. Digits and nails w/o clubbing, cyanosis, infection, petechiae, ischemia, or inflammatory conditions.. Integumentary (Hair, Skin) No suspicious lesions. No crepitus or fluctuance. No peri-wound warmth or erythema. No masses.Marland Kitchen Psychiatric Judgement and insight Intact.. No evidence of depression, anxiety, or agitation.. Notes the right medial ankle looks very clean and there is complete resolution of the ulceration in this area. The right medial part of her calcaneal region has minimal callus today at the depths of the wound were sharply debrided with a #3 curet and bleeding controlled with pressure. Electronic Signature(s) Signed: 02/10/2016 3:25:16 PM By: Madison Fudge MD, FACS Entered By: [name] on 02/10/2016 15:25:16 [name] ([id number]) -------------------------------------------------------------------------------- Physician Orders Details Patient Name: [name] Date of Service: [name] 3:00 PM Medical Record Patient Account Number: [id number] Number: Afful, [name], [name], Treating [name]: 09-12-1935 (80 y.o. [name] Date of Birth/Sex: Female)  Other Clinician: Primary Care Physician: [name] Treating [name] Referring Physician: Halina [name] Physician/Extender: [name] in Treatment: 10 Verbal / Phone Orders: Yes Clinician: Afful, [name], [name], [name] Read Back and Verified: Yes Diagnosis Coding Wound Cleansing Wound #5 Right,Medial Calcaneus o Clean wound with Normal Saline. - in clinic o Cleanse wound with mild soap and water Anesthetic Wound #5 Right,Medial Calcaneus o Topical Lidocaine 4% cream applied to wound bed prior to debridement Primary Wound Dressing Wound #5 Right,Medial Calcaneus o Prisma Ag Secondary Dressing Wound #5 Right,Medial Calcaneus o Dry Gauze o Boardered Foam Dressing Dressing Change Frequency Wound #5 Right,Medial Calcaneus o Change dressing every other day. Follow-up Appointments o Return Appointment in 1 week. Off-Loading o Other: - float heels with pillow under calves when in bed; wear pressure relieving boots while in bed Additional Orders /  Instructions Wound #5 Right,Medial Calcaneus o Increase protein intake. o Activity as tolerated - VItamin C, A, ZINC, MVI PRICE, PHILLABAUM ([id number]) Electronic Signature(s) Signed: 02/10/2016 4:38:34 PM By: Madison Fudge MD, FACS Signed: 02/10/2016 5:32:33 PM By: Regan Lemming BSN, [name] Entered By: Regan Lemming on 02/10/2016 15:21:44 [name] ([id number]) -------------------------------------------------------------------------------- Problem List Details Patient Name: DAMALI, BAGLIO [name] 3:00 Date of Service: PM Medical Record [id number] Number: Patient Account Number: [id number] 05/29/1936 (80 y.o. Treating [name]: [name] Date of Birth/Sex: Female) Other Clinician: Primary Care Physician: [name] Treating [name] Referring Physician: Halina [name] Physician/Extender: [name] in Treatment: 10 Active Problems ICD-10 Encounter Code Description Active Date Diagnosis E11.621  Type 2 diabetes mellitus with foot ulcer 12/02/2015 Yes L89.613 Pressure ulcer of right heel, stage 3 12/02/2015 Yes Z79.01 Long term (current) use of anticoagulants 12/02/2015 Yes Inactive Problems Resolved Problems ICD-10 Code Description Active Date Resolved Date L97.212 Non-pressure chronic ulcer of right calf with fat layer 12/02/2015 12/02/2015 exposed L97.211 Non-pressure chronic ulcer of right calf limited to 12/02/2015 12/02/2015 breakdown of skin Electronic Signature(s) Signed: 02/10/2016 3:27:13 PM By: Madison Fudge MD, FACS Previous Signature: [name] 3:23:58 PM Version By: Madison Fudge MD, FACS Entered By: [name] on 02/10/2016 15:27:12 [name] ([id number]) -------------------------------------------------------------------------------- Progress Note Details Patient Name: [name], [name] [name] 3:00 Date of Service: PM Medical Record [id number] Number: Patient Account Number: [id number] 1936/04/10 (80 y.o. Treating [name]: [name] Date of Birth/Sex: Female) Other Clinician: Primary Care Physician: [name] Treating [name] Referring Physician: Halina [name] Physician/Extender: [name] in Treatment: 10 Subjective Chief Complaint Information obtained from Patient Patient is at the clinic for treatment of an open pressure ulcer to her right calcaneum which has been there for about 4 weeks History of Present Illness (HPI) The following HPI elements were documented for the patient's wound: Location: right posterior heel, and both lower extremities Quality: Patient reports experiencing a sharp pain to affected area(s). Severity: Patient states wound (s) are getting better. Duration: Patient has had the wound for < 4 weeks prior to presenting for treatment Timing: Pain in wound is Intermittent (comes and goes Context: The wound appeared gradually over time while she was in hospital with a right hip fracture Modifying Factors: Consults to  this date include:was seen by her PCP and put on some antibiotics a while ago Associated Signs and Symptoms: Patient reports having difficulty standing for long periods. 80 year old patient referred who was recently discharged in April of this year with a left heel ulcer now comes with a right heel ulcer and some ulcerations in both lower extremities which have all come along during her recent hospitalization which she had a right hip fracture. She has a past medical history of COPD, diabetes mellitus type 2 with chronic kidney disease, multi-infarct dementia, anxiety, urinary retention, paroxysmal atrial fibrillation. She has also had moderate malnutrition, multi-infarct dementia, congestive heart failure, left displaced femoral neck fracture, systemic lupus erythematosus. She is also status total abdominal hysterectomy with bilateral salpingo-oophorectomy, EGDs, hip arthroplasty on the left, cataract surgery. She has been a from a smoker and quit in May 2013 and she smoked for about 40 years. recently admitted to [facility] between January 19 and 07/03/2015 12/09/2015 -- right foot x-ray -- IMPRESSION:Soft tissue ulceration of the heel. No underlying erosive bony abnormality. Diffuse degenerative change. No acute bony abnormality. 12/30/2015-- admitted to the hospital between July 12 and July 18 of 2017 and was treated for cerebral infarction, infected decubitus ulcer  and aspiration pneumonia of the right lung. MRI was negative for stroke and was thought to be secondary to pneumonia. She was treated with clindamycin. Patient was discharged home on Keflex and Zyvox. [name] ([id number]) Dr. Lucky Cowboy took her for an aortogram and a right lower extremity runoff and found to 30% stenosis in the mid to distal SFA but no significant stenosis in the popliteal artery and the SFA. There was a two-vessel runoff through the anterior tibial artery and peroneal artery without  significant stenosis. felt her perfusion was adequate for wound healing. Objective Constitutional Pulse regular. Respirations normal and unlabored. Afebrile. Vitals Time Taken: 3:01 PM, Height: 64 in, Weight: 110 lbs, BMI: 18.9, Temperature: 97.6 F, Pulse: 59 bpm, Respiratory Rate: 18 breaths/min, Blood Pressure: 141/50 mmHg. Eyes Nonicteric. Reactive to light. Ears, Nose, Mouth, and Throat Lips, teeth, and gums WNL.Marland Kitchen Moist mucosa without lesions. Neck supple and nontender. No palpable supraclavicular or cervical adenopathy. Normal sized without goiter. Respiratory WNL. No retractions.. Cardiovascular Pedal Pulses WNL. No clubbing, cyanosis or edema. Lymphatic No adneopathy. No adenopathy. No adenopathy. Musculoskeletal Adexa without tenderness or enlargement.. Digits and nails w/o clubbing, cyanosis, infection, petechiae, ischemia, or inflammatory conditions.Marland Kitchen Psychiatric Judgement and insight Intact.. No evidence of depression, anxiety, or agitation.. General Notes: the right medial ankle looks very clean and there is complete resolution of the ulceration in this area. The right medial part of her calcaneal region has minimal callus today at the depths of the wound were sharply debrided with a #3 curet and bleeding controlled with pressure. Integumentary (Hair, Skin) DANAIJAH, FARLER. ([id number]) No suspicious lesions. No crepitus or fluctuance. No peri-wound warmth or erythema. No masses.. Wound #5 status is Open. Original cause of wound was Gradually Appeared. The wound is located on the Right,Medial Calcaneus. The wound measures 0.9cm length x 0.9cm width x 0.1cm depth; 0.636cm^2 area and 0.064cm^3 volume. The wound is limited to skin breakdown. There is no tunneling or undermining noted. There is a large amount of serosanguineous drainage noted. The wound margin is distinct with the outline attached to the wound base. There is large (67-100%) pink, pale granulation within  the wound bed. There is a small (1-33%) amount of necrotic tissue within the wound bed including Adherent Slough. The periwound skin appearance exhibited: Moist. The periwound skin appearance did not exhibit: Callus, Crepitus, Excoriation, Fluctuance, Friable, Induration, Localized Edema, Rash, Scarring, Dry/Scaly, Maceration, Atrophie Blanche, Cyanosis, Ecchymosis, Hemosiderin Staining, Mottled, Pallor, Rubor, Erythema. Periwound temperature was noted as No Abnormality. The periwound has tenderness on palpation. Other Condition(s) Patient presents with Suspected Deep Tissue Injury located on the Right medial ankle. The skin appearance exhibited: Ecchymosis. The skin appearance did not exhibit: Atrophie Blanche, Callus, Crepitus, Cyanosis, Dry/Scaly, Erythema, Excoriation, Fluctuance, Friable, Hemosiderin Staining, Induration, Localized Edema, Maceration, Moist, Mottled, Pallor, Rash, Rubor, Scarring. Skin temperature was noted as No Abnormality. Assessment Active Problems ICD-10 E11.621 - Type 2 diabetes mellitus with foot ulcer L89.613 - Pressure ulcer of right heel, stage 3 Z79.01 - Long term (current) use of anticoagulants Procedures Wound #5 Wound #5 is a Diabetic Wound/Ulcer of the Lower Extremity located on the Right,Medial Calcaneus . There was a Skin/Subcutaneous Tissue Debridement [id number]) debridement with total area of 0.81 sq cm performed by Madison Fudge, MD. with the following instrument(s): Curette to remove Viable and Non-Viable tissue/material including Exudate, Fibrin/Slough, and Subcutaneous after achieving pain control using Lidocaine 4% Topical Solution. A time out was conducted at 15:16, prior to the start of the procedure. A Minimum  amount of bleeding was controlled with Pressure. The procedure was tolerated well with a pain level of 0 throughout and a pain level of 0 following the procedure. Post Debridement Measurements: 0.9cm length x 0.9cm width x 0.1cm  depth; 0.064cm^3 volume. Character of Wound/Ulcer Post Debridement is improved. Severity of Tissue Post Debridement is: Fat layer KAYDEE, BELTRE. ([id number]) exposed. Post procedure Diagnosis Wound #5: Same as Pre-Procedure Plan Wound Cleansing: Wound #5 Right,Medial Calcaneus: Clean wound with Normal Saline. - in clinic Cleanse wound with mild soap and water Anesthetic: Wound #5 Right,Medial Calcaneus: Topical Lidocaine 4% cream applied to wound bed prior to debridement Primary Wound Dressing: Wound #5 Right,Medial Calcaneus: Prisma Ag Secondary Dressing: Wound #5 Right,Medial Calcaneus: Dry Gauze Boardered Foam Dressing Dressing Change Frequency: Wound #5 Right,Medial Calcaneus: Change dressing every other day. Follow-up Appointments: Return Appointment in 1 week. Off-Loading: Other: - float heels with pillow under calves when in bed; wear pressure relieving boots while in bed Additional Orders / Instructions: Wound #5 Right,Medial Calcaneus: Increase protein intake. Activity as tolerated - VItamin C, A, ZINC, MVI I have recommended: 1. will change over to Prisma AG to the wounds on the right heel. This is to be changed every other day 2. The right lower extremity will not need anymore dressings 3. Good control of her diabetes 4. Adequate nutrition with protein supplements, vitamin A, vitamin C and zinc 5. Regular visits to the wound center BRIGGITTE, ROSENBALM ([id number]) Electronic Signature(s) Signed: 02/10/2016 3:27:35 PM By: Madison Fudge MD, FACS Previous Signature: [name] 3:26:34 PM Version By: Madison Fudge MD, FACS Entered By: [name] on 02/10/2016 15:27:34 [name] ([id number]) -------------------------------------------------------------------------------- Luck Details Patient Name: [name] Date of Service: [name] Medical Record Number: [id number] Patient Account Number: [id number] Date of Birth/Sex: 07/09/1935 (80 y.o.  Female) Treating [name]: [name] Primary Care Physician: [name] Other Clinician: Referring Physician: Halina [name] Treating Physician/Extender: Frann Rider in Treatment: 10 Diagnosis Coding ICD-10 Codes Code Description E11.621 Type 2 diabetes mellitus with foot ulcer L89.613 Pressure ulcer of right heel, stage 3 L97.212 Non-pressure chronic ulcer of right calf with fat layer exposed L97.211 Non-pressure chronic ulcer of right calf limited to breakdown of skin Z79.01 Long term (current) use of anticoagulants Facility Procedures CPT4 Code: [id number] Description: 11042 - DEB SUBQ TISSUE 20 SQ CM/< ICD-10 Description Diagnosis E11.621 Type 2 diabetes mellitus with foot ulcer L89.613 Pressure ulcer of right heel, stage 3 Modifier: Quantity: 1 Physician Procedures CPT4 Code: [id number] Description: [id number] - WC PHYS SUBQ TISS 20 SQ CM ICD-10 Description Diagnosis E11.621 Type 2 diabetes mellitus with foot ulcer L89.613 Pressure ulcer of right heel, stage 3 Modifier: Quantity: 1 Electronic Signature(s) Signed: 02/10/2016 3:26:51 PM By: Madison Fudge MD, FACS Entered By: [name] on 02/10/2016 15:26:50

## 2016-02-15 ENCOUNTER — Other Ambulatory Visit: Payer: Self-pay | Admitting: Internal Medicine

## 2016-02-15 ENCOUNTER — Telehealth: Payer: Self-pay

## 2016-02-15 DIAGNOSIS — F0390 Unspecified dementia without behavioral disturbance: Secondary | ICD-10-CM | POA: Diagnosis not present

## 2016-02-15 DIAGNOSIS — I5032 Chronic diastolic (congestive) heart failure: Secondary | ICD-10-CM | POA: Diagnosis not present

## 2016-02-15 DIAGNOSIS — J449 Chronic obstructive pulmonary disease, unspecified: Secondary | ICD-10-CM | POA: Diagnosis not present

## 2016-02-15 DIAGNOSIS — L89614 Pressure ulcer of right heel, stage 4: Secondary | ICD-10-CM | POA: Diagnosis not present

## 2016-02-15 DIAGNOSIS — S72001D Fracture of unspecified part of neck of right femur, subsequent encounter for closed fracture with routine healing: Secondary | ICD-10-CM | POA: Diagnosis not present

## 2016-02-15 DIAGNOSIS — E1142 Type 2 diabetes mellitus with diabetic polyneuropathy: Secondary | ICD-10-CM | POA: Diagnosis not present

## 2016-02-15 MED ORDER — METOPROLOL TARTRATE 25 MG PO TABS: 25.0000 mg | ORAL_TABLET | Freq: Two times a day (BID) | ORAL | 1 refills | Status: DC

## 2016-02-15 NOTE — Telephone Encounter (Signed)
This was sent to [facility] on 02/04/16.  But, I sent it again anyway.

## 2016-02-15 NOTE — Telephone Encounter (Signed)
pys husband called said pt is out of her Metoprolol tartrate 25 mg,  Pt is using  [facility].

## 2016-02-18 ENCOUNTER — Encounter: Payer: Medicare Other | Attending: Surgery | Admitting: Surgery

## 2016-02-18 ENCOUNTER — Encounter: Payer: Self-pay | Admitting: Urology

## 2016-02-18 ENCOUNTER — Ambulatory Visit (INDEPENDENT_AMBULATORY_CARE_PROVIDER_SITE_OTHER): Payer: Medicare Other | Admitting: Urology

## 2016-02-18 VITALS — BP 136/71 | HR 59 | Ht 64.0 in | Wt 100.9 lb

## 2016-02-18 DIAGNOSIS — S72001D Fracture of unspecified part of neck of right femur, subsequent encounter for closed fracture with routine healing: Secondary | ICD-10-CM | POA: Diagnosis not present

## 2016-02-18 DIAGNOSIS — Z7901 Long term (current) use of anticoagulants: Secondary | ICD-10-CM | POA: Insufficient documentation

## 2016-02-18 DIAGNOSIS — I639 Cerebral infarction, unspecified: Secondary | ICD-10-CM

## 2016-02-18 DIAGNOSIS — M329 Systemic lupus erythematosus, unspecified: Secondary | ICD-10-CM | POA: Insufficient documentation

## 2016-02-18 DIAGNOSIS — Z87448 Personal history of other diseases of urinary system: Secondary | ICD-10-CM

## 2016-02-18 DIAGNOSIS — Z87891 Personal history of nicotine dependence: Secondary | ICD-10-CM | POA: Insufficient documentation

## 2016-02-18 DIAGNOSIS — Z87898 Personal history of other specified conditions: Secondary | ICD-10-CM

## 2016-02-18 DIAGNOSIS — L97411 Non-pressure chronic ulcer of right heel and midfoot limited to breakdown of skin: Secondary | ICD-10-CM | POA: Diagnosis not present

## 2016-02-18 DIAGNOSIS — N189 Chronic kidney disease, unspecified: Secondary | ICD-10-CM | POA: Insufficient documentation

## 2016-02-18 DIAGNOSIS — E1142 Type 2 diabetes mellitus with diabetic polyneuropathy: Secondary | ICD-10-CM | POA: Diagnosis not present

## 2016-02-18 DIAGNOSIS — J449 Chronic obstructive pulmonary disease, unspecified: Secondary | ICD-10-CM | POA: Insufficient documentation

## 2016-02-18 DIAGNOSIS — E1122 Type 2 diabetes mellitus with diabetic chronic kidney disease: Secondary | ICD-10-CM | POA: Insufficient documentation

## 2016-02-18 DIAGNOSIS — I509 Heart failure, unspecified: Secondary | ICD-10-CM | POA: Insufficient documentation

## 2016-02-18 DIAGNOSIS — F0390 Unspecified dementia without behavioral disturbance: Secondary | ICD-10-CM | POA: Insufficient documentation

## 2016-02-18 DIAGNOSIS — L89613 Pressure ulcer of right heel, stage 3: Secondary | ICD-10-CM | POA: Insufficient documentation

## 2016-02-18 DIAGNOSIS — E11621 Type 2 diabetes mellitus with foot ulcer: Secondary | ICD-10-CM | POA: Diagnosis not present

## 2016-02-18 DIAGNOSIS — R339 Retention of urine, unspecified: Secondary | ICD-10-CM | POA: Diagnosis not present

## 2016-02-18 DIAGNOSIS — N200 Calculus of kidney: Secondary | ICD-10-CM | POA: Diagnosis not present

## 2016-02-18 DIAGNOSIS — L89614 Pressure ulcer of right heel, stage 4: Secondary | ICD-10-CM | POA: Diagnosis not present

## 2016-02-18 DIAGNOSIS — I5032 Chronic diastolic (congestive) heart failure: Secondary | ICD-10-CM | POA: Diagnosis not present

## 2016-02-18 DIAGNOSIS — F419 Anxiety disorder, unspecified: Secondary | ICD-10-CM | POA: Insufficient documentation

## 2016-02-18 DIAGNOSIS — R310 Gross hematuria: Secondary | ICD-10-CM | POA: Diagnosis not present

## 2016-02-18 DIAGNOSIS — I480 Paroxysmal atrial fibrillation: Secondary | ICD-10-CM | POA: Insufficient documentation

## 2016-02-18 LAB — BLADDER SCAN AMB NON-IMAGING: SCAN RESULT: 208

## 2016-02-18 NOTE — Patient Instructions (Signed)
Please try to use the restroom every 2 to 3 hours while awake even if you do not feel like you need to urinate.

## 2016-02-18 NOTE — Progress Notes (Signed)
02/18/2016 11:43 AM   Ellis Parents Eleanora Neighbor [birth year] [id number]  Referring provider: Glean Hess, MD [address] [place], [place] [address]  Chief Complaint  Patient presents with  . Urinary Retention    6 month follow up    HPI: Patient is a 80 year old Caucasian female with dementia with a history of microscopic hematuria, a history of urinary retention and a history of nephrolithiasis who presents today for a 6 month follow-up.  Hematuria Patient underwent a hematuria workup included a CT urogram and cystoscopy which was negative for GU pathology 2016.  She has had another episode of hematuria in August 2017. CT renal stone study noted punctate nonobstructing stones in the upper right kidney. She was found to have a positive urine culture for Escherichia coli. She was placed on antibiotics. She has not had any further hematuria.  Nephrolithiasis Patient underwent a CT renal stone study on 01/15/2016 which noted punctate nonobstructing stones in the upper right kidney.  Patient had not had flank pain.  or gross hematuria. She is also not passed any stone fragments.  History of urinary retention Patient experienced urinary retention after suffering a fall 2016. She has subsequently passed a voiding trial and is voiding on her own. Her PVR today is 208 mL.  She complains of urinary frequency, nocturia and intermittency.  She suffers from severe dementia.    She does not complain of dysuria or suprapubic pain.  She is not having recent fevers, chills, nausea or vomiting. She is not using the vaginal estrogen cream.   PMH: Past Medical History:  Diagnosis Date  . Ankylosing spondylitis ([place])    ? how diagnosed  . Anxiety   . Asthma    as child  . Chronic diastolic CHF (congestive heart failure) ([place])    a. 03/2015 Echo: EF 55-60%, Gr 1 DD.  Marland Kitchen Chronic kidney infection   . Chronic peptic ulcer, unspecified site, without mention of hemorrhage, perforation, or  obstruction   . COPD (chronic obstructive pulmonary disease) ([place])   . COPD (chronic obstructive pulmonary disease) ([place])    a.Uses 2l prn - followed by Dr. Raul Del.  . Diabetes mellitus ([place])   . DM (diabetes mellitus) ([place])   . GERD (gastroesophageal reflux disease)   . Headache   . History of kidney stones   . Hypothyroidism    mass, U/S 6/21 - nodules  . IBS (irritable bowel syndrome)   . Insomnia, unspecified   . Left displaced femoral neck fracture ([place])    a. 03/2015 s/p L hemiarthroplasty.  . Lumbar spondylolysis   . Mitral valve prolapse    a. Not noted on 03/2015 Echo.  . Motion sickness    car - back seat  . Neuropathy ([place])   . Osteoarthritis   . PAF (paroxysmal atrial fibrillation) ([place])    a. 03/2015 in setting of hip Fx->Amio/Xarelo;  b. CHA2DS2VASc= 4.  . Palpitations   . Raynaud disease   . Raynaud's disease   . Rheumatic fever   . Systemic lupus erythematosus ([place])   . Uveitis, anterior    ????  . Wears contact lenses   . Wears dentures    full upper, partial lower  . Wheezing     Surgical History: Past Surgical History:  Procedure Laterality Date  . BIOPSY THYROID    . BREAST BIOPSY    . CATARACT EXTRACTION W/PHACO Left 03/23/2015   Procedure: CATARACT EXTRACTION PHACO AND INTRAOCULAR LENS PLACEMENT (IOC);  Surgeon: Gwyndolyn Saxon  Porfilio, MD;  Location: [facility];  Service: Ophthalmology;  Laterality: Left;  [place] 00:51   . CATARACT EXTRACTION W/PHACO Right 05/25/2015   Procedure: CATARACT EXTRACTION PHACO AND INTRAOCULAR LENS PLACEMENT (IOC);  Surgeon: Birder Robson, MD;  Location: [facility];  Service: Ophthalmology;  Laterality: Right;  [place] 00:57   . COLONOSCOPY  multiple  . ESOPHAGOGASTRODUODENOSCOPY  multiple  . ESOPHAGOGASTRODUODENOSCOPY (EGD) WITH PROPOFOL N/A 12/11/2014   Procedure: ESOPHAGOGASTRODUODENOSCOPY (EGD) WITH PROPOFOL;  Surgeon: [name], MD;  Location: [facility];  Service: Endoscopy;  Laterality: N/A;  cytology brushing  .  HIP ARTHROPLASTY Left 03/26/2015   Procedure: ARTHROPLASTY BIPOLAR HIP (HEMIARTHROPLASTY);  Surgeon: Earnestine Leys, MD;  Location: [facility];  Service: Orthopedics;  Laterality: Left;  . HIP ARTHROPLASTY Right 11/06/2015   Procedure: ARTHROPLASTY BIPOLAR HIP (HEMIARTHROPLASTY);  Surgeon: Thornton Park, MD;  Location: [facility];  Service: Orthopedics;  Laterality: Right;  . JOINT REPLACEMENT Left   . PERIPHERAL VASCULAR CATHETERIZATION N/A 12/27/2015   Procedure: Lower Extremity Angiography;  Surgeon: Algernon Huxley, MD;  Location: [facility];  Service: Cardiovascular;  Laterality: N/A;  . PERIPHERAL VASCULAR CATHETERIZATION  12/27/2015   Procedure: Lower Extremity Intervention;  Surgeon: Algernon Huxley, MD;  Location: [facility];  Service: Cardiovascular;;  . TOTAL ABDOMINAL HYSTERECTOMY W/ BILATERAL SALPINGOOPHORECTOMY  1990    Home Medications:    Medication List       Accurate as of 02/18/16 11:43 AM. Always use your most recent med list.          albuterol (2.5 MG/3ML) 0.083% nebulizer solution Commonly known as:  PROVENTIL Take 2.5 mg by nebulization every 6 (six) hours as needed for wheezing or shortness of breath. Reported on 07/09/2015   ALIGN 4 MG Caps Take 1 capsule (4 mg total) by mouth daily.   amiodarone 200 MG tablet Commonly known as:  PACERONE Take 1 tablet (200 mg total) by mouth daily.   cephALEXin 250 MG capsule Commonly known as:  KEFLEX Take 1 capsule (250 mg total) by mouth 3 (three) times daily.   collagenase ointment Commonly known as:  SANTYL Apply topically daily.   docusate sodium 100 MG capsule Commonly known as:  COLACE Take 1 capsule (100 mg total) by mouth 2 (two) times daily.   donepezil 5 MG tablet Commonly known as:  ARICEPT Take by mouth.   feeding supplement (ENSURE ENLIVE) Liqd Take 237 mLs by mouth 2 (two) times daily between meals.   ferrous sulfate 325 (65 FE) MG tablet Take 1 tablet (325 mg total) by mouth 2 (two)  times daily with a meal.   fluconazole 100 MG tablet Commonly known as:  DIFLUCAN Take 1 tablet (100 mg total) by mouth daily.   JANUVIA 100 MG tablet Generic drug:  sitaGLIPtin Take 0.5 tablets (50 mg total) by mouth daily.   linezolid 600 MG tablet Commonly known as:  ZYVOX Take 1 tablet (600 mg total) by mouth every 12 (twelve) hours.   LORazepam 1 MG tablet Commonly known as:  ATIVAN Take 1 tablet (1 mg total) by mouth at bedtime.   memantine 5 MG tablet Commonly known as:  NAMENDA Take 5 mg by mouth 2 (two) times daily.   metFORMIN 500 MG tablet Commonly known as:  GLUCOPHAGE Take 3 tablets (1,500 mg total) by mouth daily.   metoprolol tartrate 25 MG tablet Commonly known as:  LOPRESSOR Take 1 tablet (25 mg total) by mouth 2 (two) times daily.   mometasone-formoterol 100-5  MCG/ACT Aero Commonly known as:  DULERA Inhale 2 puffs into the lungs 2 (two) times daily.   oxyCODONE 5 MG immediate release tablet Commonly known as:  Oxy IR/ROXICODONE Take 1 tablet (5 mg total) by mouth every 4 (four) hours as needed for severe pain ((for MODERATE breakthrough pain)).   ranitidine 150 MG tablet Commonly known as:  ZANTAC Take 1 tablet (150 mg total) by mouth 2 (two) times daily.   rivaroxaban 20 MG Tabs tablet Commonly known as:  XARELTO Take by mouth.   senna 8.6 MG Tabs tablet Commonly known as:  SENOKOT Take 1 tablet by mouth 2 (two) times daily.   tiotropium 18 MCG inhalation capsule Commonly known as:  SPIRIVA Place 1 capsule (18 mcg total) into inhaler and inhale daily.       Allergies:  Allergies  Allergen Reactions  . Codeine Nausea And Vomiting  . Penicillins Hives    Has patient had a PCN reaction causing immediate rash, facial/tongue/throat swelling, SOB or lightheadedness with hypotension: no  Has patient had a PCN reaction causing severe rash involving mucus membranes or skin necrosis: no  Has patient had a PCN reaction that required  hospitalization: no  Has patient had a PCN reaction occurring within the last 10 years: no  If all of the above answers are "NO", then may proceed with Cephalosporin use.   . Sulfa Antibiotics   . Doxycycline Rash  . Erythromycin Rash  . Latex Rash    Family History: Family History  Problem Relation Age of Onset  . Heart disease Mother   . Hypothyroidism Mother   . Diabetes Mother   . Heart attack Mother   . Hypertension Mother   . Heart disease Father   . Heart attack Father   . Breast cancer Sister   . Hypothyroidism Sister   . Diabetes Sister   . Ovarian cancer Sister   . Diabetes Brother   . Prostate cancer Brother   . Hypothyroidism Daughter   . Kidney Stones Brother   . Kidney disease Neg Hx     Social History:  reports that she quit smoking about 4 years ago. Her smoking use included Cigarettes. She has a 10.00 pack-year smoking history. She has never used smokeless tobacco. She reports that she does not drink alcohol or use drugs.  ROS: UROLOGY Frequent Urination?: Yes Hard to postpone urination?: No Burning/pain with urination?: No Get up at night to urinate?: Yes Leakage of urine?: No Urine stream starts and stops?: Yes Trouble starting stream?: No Do you have to strain to urinate?: No Blood in urine?: No Urinary tract infection?: No Sexually transmitted disease?: No Injury to kidneys or bladder?: No Painful intercourse?: No Weak stream?: No Currently pregnant?: No Vaginal bleeding?: No Last menstrual period?: n  Gastrointestinal Nausea?: No Vomiting?: No Indigestion/heartburn?: No Diarrhea?: No Constipation?: No  Constitutional Fever: No Night sweats?: No Weight loss?: Yes Fatigue?: No  Skin Skin rash/lesions?: No Itching?: No  Eyes Blurred vision?: No Double vision?: No  Ears/Nose/Throat Sore throat?: No Sinus problems?: No  Hematologic/Lymphatic Swollen glands?: No Easy bruising?: No  Cardiovascular Leg swelling?:  No Chest pain?: No  Respiratory Cough?: Yes Shortness of breath?: No  Endocrine Excessive thirst?: No  Musculoskeletal Back pain?: Yes Joint pain?: No  Neurological Headaches?: No Dizziness?: No  Psychologic Depression?: Yes Anxiety?: No  Physical Exam: BP 136/71   Pulse (!) 59   Ht 5\' 4"  (1.626 m)   Wt 100 lb 14.4 oz (45.8 kg)  BMI 17.32 kg/m   Constitutional: Well nourished. Alert and oriented, No acute distress. HEENT: [place] AT, moist mucus membranes. Trachea midline, no masses. Cardiovascular: No clubbing, cyanosis, or edema. Respiratory: Normal respiratory effort, no increased work of breathing. GI: Abdomen is soft, non tender, non distended, no abdominal masses. Liver and spleen not palpable.  No hernias appreciated.  Stool sample for occult testing is not indicated.   GU: No CVA tenderness.  No bladder fullness or masses.   Skin: No rashes, bruises or suspicious lesions. Lymph: No cervical or inguinal adenopathy. Neurologic: Grossly intact, no focal deficits, moving all 4 extremities. Psychiatric: Normal mood and affect.  Laboratory Data: Lab Results  Component Value Date   WBC 7.0 01/15/2016   HGB 10.4 (L) 01/15/2016   HCT 30.9 (L) 01/15/2016   MCV 85.8 01/15/2016   PLT 227 01/15/2016    Lab Results  Component Value Date   CREATININE 0.66 01/15/2016    Lab Results  Component Value Date   HGBA1C 7.6 (H) 12/22/2015    Lab Results  Component Value Date   TSH 1.726 07/01/2015       Component Value Date/Time   CHOL 125 12/23/2015 0507   HDL 48 12/23/2015 0507   CHOLHDL 2.6 12/23/2015 0507   VLDL 12 12/23/2015 0507   LDLCALC 65 12/23/2015 0507    Lab Results  Component Value Date   AST 53 (H) 12/22/2015   Lab Results  Component Value Date   ALT 34 12/22/2015    Pertinent Imaging: Results for LILIAHNA, ORENDAIN (MRN [id number]) as of 02/20/2016 22:19  Ref. Range 02/18/2016 11:24  Scan Result Unknown [id number]   CLINICAL DATA:   Hematuria for 2 days. Burning with urination. History of kidney stones.  EXAM: CT ABDOMEN AND PELVIS WITHOUT CONTRAST  TECHNIQUE: Multidetector CT imaging of the abdomen and pelvis was performed following the standard protocol without IV contrast.  COMPARISON:  Abdominal CT 08/07/2013, chest CT 07/30/2015  FINDINGS: Lower chest: Scarring in the inferior right lower lobe, similar to prior chest CT. Additionally there are faint Ill-defined patchy reticulonodular opacities in the left lower lobe, not seen previously. No pleural fluid.  Liver: No focal lesion allowing for lack contrast.  Hepatobiliary: Gallbladder is could. No calcified stone. No biliary dilatation.  Pancreas: Not well characterized secondary to lack of contrast and paucity of intra-abdominal fat. No ductal dilatation or inflammation.  Spleen: Normal.  Adrenal glands: Nodular left adrenal gland with unchanged adenoma. Right adrenal gland is normal.  Kidneys: Punctate nonobstructing stones in the upper right kidney. No hydronephrosis or perinephric edema. No ureteral stones, portions of the distal ureters are obscured by bilateral hip prostheses. Previous left renal cyst is not well demonstrated without contrast, contour deformity is similar.  Stomach/Bowel: Stomach physiologically distended. Equivocal wall thickening involving the gastric fundus. Moderate stool burden. No evidence of bowel inflammation or obstruction. Pelvic bowel loops suboptimally assessed. Probable minimal sigmoid colonic diverticulosis without evidence of diverticular inflammation.  Vascular/Lymphatic: No retroperitoneal adenopathy. Abdominal aorta is normal in caliber. Dense atherosclerosis of the abdominal aorta. No aneurysm.  Reproductive: Uterus is surgically absent.  No adnexal mass.  Bladder: Majority obscured by streak artifact. Physiologically distended. Limited assessment for wall thickening due to  streak artifact.  Other: No free air, free fluid, or intra-abdominal fluid collection.  Musculoskeletal: There are no acute or suspicious osseous abnormalities. Minimal anterolisthesis of L4 on L5 appears degenerative. There is facet arthropathy in the lower lumbar spine. Bilateral hip arthroplasties.  IMPRESSION: 1.  Nonobstructing right nephrolithiasis. No hydronephrosis or evidence of obstructive uropathy. Previous left renal cyst is not well characterized. If an additional etiology for hematuria is suspected, recommend nonemergent evaluation with and without contrast per hematuria protocol. Urinary bladder is suboptimally assessed secondary to streak artifact from bilateral hip arthroplasties. 2. Equivocal gastric wall thickening involving the fundus. 3. Abdominal aortic atherosclerosis. Unchanged left adrenal adenoma and nodularity.   Electronically Signed   By: Jeb Levering M.D.   On: 01/15/2016 19:35  Assessment & Plan:    1. History of urinary retention  - BLADDER SCAN AMB NON-IMAGING  - dicussed timed voiding with the patient and her caregiver  - patient will sit on the commode every 2 1/2 half hours  - RTC in 3 months for PVR and symptom recheck  2. Gross Hematuria  - work up completed in 2016 was negative  - gross hematuria in 01/2016- punctate stones in upper right kidneys and positive urine culture for E. coli  - will continue to monitor  - will report any further gross hematuria  3. Nephrolithiasis  - punctate stones in the upper right kidney  - continue to monitor with yearly KUB's  Return in about 3 months (around 05/19/2016) for PVR and symptom recheck.  These notes generated with voice recognition software. I apologize for typographical errors.  Zara Council, [facility] [address], [place], [place] [address] [phone]

## 2016-02-19 NOTE — Progress Notes (Signed)
TARAE, HUX ([id number]) Visit Report for 02/18/2016 Arrival Information Details Patient Name: [name], [name]. Date of Service: 02/18/2016 2:15 PM Medical Record Number: [id number] Patient Account Number: [id number] Date of Birth/Sex: 1936-01-04 (79 y.o. Female) Treating RN: Baruch Gouty, RN, BSN, [name] Primary Care Physician: Halina Maidens Other Clinician: Referring Physician: Halina Maidens Treating Physician/Extender: Frann Rider in Treatment: 11 Visit Information History Since Last Visit All ordered tests and consults were completed: No Patient Arrived: Wheel Chair Added or deleted any medications: No Arrival Time: 14:07 Any new allergies or adverse reactions: No Accompanied By: caregiver Had a fall or experienced change in No Transfer Assistance: None activities of daily living that may affect Patient Identification Verified: Yes risk of falls: Secondary Verification Process Yes Signs or symptoms of abuse/neglect since last No Completed: visito Patient Requires Transmission- No Hospitalized since last visit: No Based Precautions: Has Dressing in Place as Prescribed: Yes Patient Has Alerts: Yes Pain Present Now: No Patient Alerts: Patient on Blood Thinner Electronic Signature(s) Signed: 02/18/2016 2:52:21 PM By: Regan Lemming BSN, RN Entered By: Regan Lemming on 02/18/2016 14:08:13 [name] ([id number]) -------------------------------------------------------------------------------- Encounter Discharge Information Details Patient Name: [name] Date of Service: 02/18/2016 2:15 PM Medical Record Number: [id number] Patient Account Number: [id number] Date of Birth/Sex: 09-30-1935 (79 y.o. Female) Treating RN: Baruch Gouty, RN, BSN, [name] Primary Care Physician: Halina Maidens Other Clinician: Referring Physician: Halina Maidens Treating Physician/Extender: Frann Rider in Treatment: 11 Encounter Discharge Information Items Discharge Pain Level:  0 Discharge Condition: Stable Ambulatory Status: Wheelchair Discharge Destination: Home Private Transportation: Auto Accompanied By: caregiver Schedule Follow-up Appointment: No Medication Reconciliation completed and No provided to Patient/Care Angie Hogg: Clinical Summary of Care: Electronic Signature(s) Signed: 02/18/2016 2:52:21 PM By: Regan Lemming BSN, RN Previous Signature: 02/18/2016 2:27:58 PM Version By: Ruthine Dose Entered By: Regan Lemming on 02/18/2016 14:30:44 [name] ([id number]) -------------------------------------------------------------------------------- Lower Extremity Assessment Details Patient Name: [name] Date of Service: 02/18/2016 2:15 PM Medical Record Number: [id number] Patient Account Number: [id number] Date of Birth/Sex: 1935/07/04 (79 y.o. Female) Treating RN: Baruch Gouty, RN, BSN, [name] Primary Care Physician: Halina Maidens Other Clinician: Referring Physician: Halina Maidens Treating Physician/Extender: Frann Rider in Treatment: 11 Vascular Assessment Claudication: Claudication Assessment [Right:None] Pulses: Posterior Tibial Dorsalis Pedis Palpable: [Right:Yes] Extremity colors, hair growth, and conditions: Extremity Color: [Right:Normal] Hair Growth on Extremity: [Right:No] Temperature of Extremity: [Right:Warm] Capillary Refill: [Right:< 3 seconds] Toe Nail Assessment Left: Right: Thick: No Discolored: No Deformed: No Improper Length and Hygiene: No Electronic Signature(s) Signed: 02/18/2016 2:52:21 PM By: Regan Lemming BSN, RN Entered By: Regan Lemming on 02/18/2016 14:10:08 [name] ([id number]) -------------------------------------------------------------------------------- Multi Wound Chart Details Patient Name: [name] Date of Service: 02/18/2016 2:15 PM Medical Record Number: [id number] Patient Account Number: [id number] Date of Birth/Sex: 01/08/1936 (79 y.o. 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] [N/A:N/A] Comorbid History: [5:Chronic Obstructive Pulmonary Disease (COPD), Angina, Congestive Heart Failure, Type II Diabetes, Lupus Erythematosus, Osteoarthritis, Dementia] [N/A:N/A] Date Acquired: [5:11/03/2015] [N/A:N/A] Weeks of Treatment: [5:11] [N/A:N/A] Wound Status: [5:Open] [N/A:N/A] Measurements L x W x D 1.2x1x0.1 [N/A:N/A] (cm) Area (cm) : [5:0.942] [N/A:N/A]  Volume (cm) : [5:0.094] [N/A:N/A] % Reduction in Area: [5:90.10%] [N/A:N/A] % Reduction in Volume: 90.20% [N/A:N/A] Classification: [5:Grade 1] [N/A:N/A] Exudate Amount: [5:Large] [N/A:N/A] Exudate Type: [5:Serosanguineous] [N/A:N/A] Exudate Color: [5:red, brown] [N/A:N/A] Foul Odor After [5:Yes] [N/A:N/A] Cleansing: Odor Anticipated Due to No [N/A:N/A] Product Use: Wound Margin: [5:Distinct, outline attached] [N/A:N/A] Granulation Amount: Large (67-100%) N/A N/A Granulation Quality: Pink, Pale N/A N/A Necrotic Amount: Small (1-33%) N/A N/A Exposed Structures: Fascia: No N/A N/A Fat: No Tendon: No Muscle: No Joint: No Bone: No Limited to Skin Breakdown Epithelialization: Medium (34-66%) N/A N/A Periwound Skin Texture: Edema: No N/A N/A Excoriation: No Induration: No Callus: No Crepitus: No Fluctuance: No Friable: No Rash: No Scarring: No Periwound Skin Moist: Yes N/A N/A Moisture: Maceration: No Dry/Scaly: No Periwound Skin Color: Atrophie Blanche: No  N/A N/A Cyanosis: No Ecchymosis: No Erythema: No Hemosiderin Staining: No Mottled: No Pallor: No Rubor: No Temperature: No Abnormality N/A N/A Tenderness on Yes N/A N/A Palpation: Wound Preparation: Ulcer Cleansing: N/A N/A Rinsed/Irrigated with Saline Topical Anesthetic Applied: Other: lidocaine 4% Treatment Notes Electronic Signature(s) Signed: 02/18/2016 2:52:21 PM By: Regan Lemming BSN, RN Entered By: Regan Lemming on 02/18/2016 14:20:09 [name] ([id number]) [name] ([id number]) -------------------------------------------------------------------------------- [place] Details Patient Name: [name], [name]. Date of Service: 02/18/2016 2:15 PM Medical Record Number: [id number] Patient Account Number: [id number] Date of Birth/Sex: [birth year] (79 y.o. Female) Treating RN: Baruch Gouty, RN, BSN, [name] Primary Care Physician: Halina Maidens Other Clinician: Referring Physician: Halina Maidens Treating Physician/Extender: Frann Rider in Treatment: 11 Active Inactive Abuse / Safety / Falls / Self Care Management Nursing Diagnoses: Potential for falls Goals: Patient/caregiver will verbalize understanding of skin care regimen Date Initiated: 12/02/2015 Goal Status: Active Patient/caregiver will verbalize/demonstrate measures taken to prevent injury and/or falls Date Initiated: 12/02/2015 Goal Status: Active Interventions: Assess fall risk on admission and as needed Assess: immobility, friction, shearing, incontinence upon admission and as needed Assess impairment of mobility on admission and as needed per policy Assess self care needs on admission and as needed Provide education on fall prevention Provide education on safe transfers Notes: Wound/Skin Impairment Nursing Diagnoses: Impaired tissue integrity Goals: Patient/caregiver will verbalize understanding of skin care regimen Date Initiated: 12/02/2015 Goal Status:  Active Interventions: Assess patient/caregiver ability to obtain necessary supplies Assess patient/caregiver ability to perform ulcer/skin care regimen upon admission and as needed [name], [name] ([id number]) Assess ulceration(s) every visit Provide education on ulcer and skin care Notes: Electronic Signature(s) Signed: 02/18/2016 2:52:21 PM By: Regan Lemming BSN, RN Entered By: Regan Lemming on 02/18/2016 14:19:58 [name] ([id number]) -------------------------------------------------------------------------------- Pain Assessment Details Patient Name: [name] Date of Service: 02/18/2016 2:15 PM Medical Record Number: [id number] Patient Account Number: [id number] Date of Birth/Sex: 1935/11/27 (79 y.o. Female) Treating RN: Baruch Gouty, RN, BSN, [name] Primary Care Physician: Halina Maidens Other Clinician: Referring Physician: Halina Maidens Treating Physician/Extender: Frann Rider in Treatment: 11 Active Problems Location of Pain Severity and Description of Pain Patient Has Paino No Site Locations With Dressing Change: No Pain Management and Medication Current Pain Management: Electronic Signature(s) Signed: 02/18/2016 2:52:21 PM By: Regan Lemming BSN, RN Entered By: Regan Lemming on 02/18/2016 14:08:22 [name] ([id number]) -------------------------------------------------------------------------------- Patient/Caregiver Education Details Patient Name: [name] Date of Service: 02/18/2016 2:15 PM Medical Record Number: [id number] Patient Account Number: [id number] Date of Birth/Gender: 02-08-1936 (80 y.o. Female) Treating RN: Afful, RN, BSN, [name] Primary Care Physician: Halina Maidens Other Clinician: Referring Physician: Halina Maidens Treating Physician/Extender: Frann Rider  in Treatment: 11 Education Assessment Education Provided To: Patient Education Topics Provided Safety: Methods: Explain/Verbal Responses: State content  correctly Wound/Skin Impairment: Methods: Explain/Verbal Responses: State content correctly Electronic Signature(s) Signed: 02/18/2016 2:52:21 PM By: Regan Lemming BSN, RN Entered By: Regan Lemming on 02/18/2016 14:30:56 [name] ([id number]) -------------------------------------------------------------------------------- Wound Assessment Details Patient Name: [name] Date of Service: 02/18/2016 2:15 PM Medical Record Number: [id number] Patient Account Number: [id number] Date of Birth/Sex: 07/01/1935 (79 y.o. Female) Treating RN: Afful, RN, BSN, [place] Primary Care Physician: Halina Maidens Other Clinician: Referring Physician: Halina Maidens Treating Physician/Extender: Frann Rider in Treatment: 11 Wound Status Wound Number: 5 Primary Diabetic Wound/Ulcer of the Lower Etiology: Extremity Wound Location: Right Calcaneus - Medial Wound Open Wounding Event: Gradually Appeared Status: Date Acquired: 11/03/2015 Comorbid Chronic Obstructive Pulmonary Disease Weeks Of Treatment: 11 History: (COPD), Angina, Congestive Heart Clustered Wound: No Failure, Type II Diabetes, Lupus Erythematosus, Osteoarthritis, Dementia Photos Photo Uploaded By: Regan Lemming on 02/18/2016 14:51:40 Wound Measurements Length: (cm) 1.2 Width: (cm) 1 Depth: (cm) 0.1 Area: (cm) 0.942 Volume: (cm) 0.094 % Reduction in Area: 90.1% % Reduction in Volume: 90.2% Epithelialization: Medium (34-66%) Tunneling: No Undermining: No Wound Description Classification: Grade 1 Wound Margin: Distinct, outline attached Exudate Amount: Large [name], [name] ([id number]) Foul Odor After Cleansing: Yes Due to Product Use: No Exudate Type: Serosanguineous Exudate Color: red, brown Wound Bed Granulation Amount: Large (67-100%) Exposed Structure Granulation Quality: Pink, Pale Fascia Exposed: No Necrotic Amount: Small (1-33%) Fat Layer Exposed: No Necrotic Quality: Adherent Slough Tendon  Exposed: No Muscle Exposed: No Joint Exposed: No Bone Exposed: No Limited to Skin Breakdown Periwound Skin Texture Texture Color No Abnormalities Noted: No No Abnormalities Noted: No Callus: No Atrophie Blanche: No Crepitus: No Cyanosis: No Excoriation: No Ecchymosis: No Fluctuance: No Erythema: No Friable: No Hemosiderin Staining: No Induration: No Mottled: No Localized Edema: No Pallor: No Rash: No Rubor: No Scarring: No Temperature / Pain Moisture Temperature: No Abnormality No Abnormalities Noted: No Tenderness on Palpation: Yes Dry / Scaly: No Maceration: No Moist: Yes Wound Preparation Ulcer Cleansing: Rinsed/Irrigated with Saline Topical Anesthetic Applied: Other: lidocaine 4%, Treatment Notes Wound #5 (Right, Medial Calcaneus) 1. Cleansed with: Clean wound with Normal Saline 4. Dressing Applied: Prisma Ag 5. Secondary Dressing Applied Bordered Foam Dressing Dry Gauze Notes stretch net [name], [name] ([id number]) Electronic Signature(s) Signed: 02/18/2016 2:52:21 PM By: Regan Lemming BSN, RN Entered By: Regan Lemming on 02/18/2016 14:19:51 [name] ([id number]) -------------------------------------------------------------------------------- Vitals Details Patient Name: [name] Date of Service: 02/18/2016 2:15 PM Medical Record Number: [id number] Patient Account Number: [id number] Date of Birth/Sex: Oct 31, 1935 (79 y.o. Female) Treating RN: Afful, RN, BSN, [place] Primary Care Physician: Halina Maidens Other Clinician: Referring Physician: Halina Maidens Treating Physician/Extender: Frann Rider in Treatment: 11 Vital Signs Time Taken: 14:10 Temperature (F): 98.6 Height (in): 64 Pulse (bpm): 61 Weight (lbs): 110 Respiratory Rate (breaths/min): 17 Body Mass Index (BMI): 18.9 Blood Pressure (mmHg): 159/66 Reference Range: 80 - 120 mg / dl Electronic Signature(s) Signed: 02/18/2016 2:52:21 PM By: Regan Lemming BSN,  RN Entered By: Regan Lemming on 02/18/2016 14:12:09

## 2016-02-19 NOTE — Progress Notes (Signed)
[name] ([id number]) Visit Report for 02/18/2016 Chief Complaint Document Details Patient Name: [name], [name]. Date of Service: 02/18/2016 2:15 PM Medical Record Patient Account Number: [id number] Number: Afful, RN, BSN, Treating RN: [birth year] (80 y.o. [name] Date of Birth/Sex: Female) Other Clinician: Primary Care Physician: [name] Treating [name] Referring Physician: Halina [name] Physician/Extender: [name] in Treatment: 11 Information Obtained from: Patient Chief Complaint Patient is at the clinic for treatment of an open pressure ulcer to her right calcaneum which has been there for about 4 weeks Electronic Signature(s) Signed: 02/18/2016 2:33:25 PM By: Madison Fudge MD, FACS Entered By: [name] on 02/18/2016 14:33:25 [name] ([id number]) -------------------------------------------------------------------------------- Debridement Details Patient Name: [name]. Date of Service: 02/18/2016 2:15 PM Medical Record Patient Account Number: [id number] Number: Afful, RN, BSN, Treating RN: 20-Nov-1935 (80 y.o. [name] Date of Birth/Sex: Female) Other Clinician: Primary Care Physician: [name] Treating [name] Referring Physician: Halina [name] Physician/Extender: [name] in Treatment: 11 Debridement Performed for Wound #5 Right,Medial Calcaneus Assessment: Performed By: Physician Madison Fudge, MD Debridement: Debridement Pre-procedure Yes - 14:18 Verification/Time Out Taken: Start Time: 14:18 Pain Control: Lidocaine 4% Topical Solution Level: Skin/Subcutaneous Tissue Total Area Debrided (L x 1.2 (cm) x 1 (cm) = 1.2 (cm) W): Tissue and other Non-Viable, Exudate, Fibrin/Slough, Subcutaneous material debrided: Instrument: Curette Bleeding: Minimum Hemostasis Achieved: Silver Nitrate End Time: 14:22 Procedural Pain: 0 Post Procedural Pain: 0 Response to Treatment: Procedure was tolerated  well Post Debridement Measurements of Total Wound Length: (cm) 1.2 Width: (cm) 1 Depth: (cm) 0.1 Volume: (cm) 0.094 Character of Wound/Ulcer Post Stable Debridement: Severity of Tissue Post Debridement: Fat layer exposed Post Procedure Diagnosis Same as Pre-procedure Electronic Signature(s) Signed: 02/18/2016 2:33:18 PM By: Madison Fudge MD, FACS Signed: 02/18/2016 2:52:21 PM By: [name] BSN, RN [name] ([id number]) Entered By: [name] on 02/18/2016 14:33:17 [name] ([id number]) -------------------------------------------------------------------------------- HPI Details Patient Name: [name]. Date of Service: 02/18/2016 2:15 PM Medical Record Patient Account Number: [id number] Number: Afful, RN, BSN, Treating RN: 1935/11/29 (80 y.o. [name] Date of Birth/Sex: Female) Other Clinician: Primary Care Physician: [name] Treating [name] Referring Physician: Halina [name] Physician/Extender: [name] in Treatment: 11 History of Present Illness Location: right posterior heel, and both lower extremities Quality: Patient reports experiencing a sharp pain to affected area(s). Severity: Patient states wound (s) are getting better. Duration: Patient has had the wound for < 4 weeks prior to presenting for treatment Timing: Pain in wound is Intermittent (comes and goes Context: The wound appeared gradually over time while she was in hospital with a right hip fracture Modifying Factors: Consults to this date include:was seen by her PCP and put on some antibiotics a while ago Associated Signs and Symptoms: Patient reports having difficulty standing for long periods. HPI Description: 80 year old patient referred who was recently discharged in April of this year with a left heel ulcer now comes with a right heel ulcer and some ulcerations in both lower extremities which have all come along during her recent hospitalization which she had  a right hip fracture. She has a past medical history of COPD, diabetes mellitus type 2 with chronic kidney disease, multi-infarct dementia, anxiety, urinary retention, paroxysmal atrial fibrillation. She has also had moderate malnutrition, multi-infarct dementia, congestive heart failure, left displaced femoral neck fracture, systemic lupus erythematosus. She is also status total abdominal hysterectomy with bilateral salpingo-oophorectomy, EGDs, hip arthroplasty on the left, cataract surgery. She has been a from a smoker  and quit in May 2013 and she smoked for about 40 years. recently admitted to [facility] between January 19 and 07/03/2015 12/09/2015 -- o right foot x-ray -- IMPRESSION:Soft tissue ulceration of the heel. No underlying erosive bony abnormality. Diffuse degenerative change. No acute bony abnormality. 12/30/2015-- admitted to the hospital between July 12 and July 18 of 2017 and was treated for cerebral infarction, infected decubitus ulcer and aspiration pneumonia of the right lung. MRI was negative for stroke and was thought to be secondary to pneumonia. She was treated with clindamycin. Patient was discharged home on Keflex and Zyvox. Dr. Lucky [name] took her for an aortogram and a right lower extremity runoff and found to 30% stenosis in the mid to distal SFA but no significant stenosis in the popliteal artery and the SFA. There was a two-vessel runoff through the anterior tibial artery and peroneal artery without significant stenosis. felt her perfusion was adequate for wound healing. Electronic Signature(s) Signed: 02/18/2016 2:33:30 PM By: Madison Fudge MD, FACS Entered By: [name] on 02/18/2016 14:33:30 [name] ([name] ([id number]) -------------------------------------------------------------------------------- Physical Exam Details Patient Name: [name], [name] Date of Service: 02/18/2016 2:15 PM Medical Record Patient  Account Number: [id number] Number: Afful, RN, BSN, Treating RN: 1935-11-25 (79 y.o. [name] Date of Birth/Sex: Female) Other Clinician: Primary Care Physician: [name] Treating [name] Referring Physician: Halina [name] Physician/Extender: [name] in Treatment: 11 Constitutional . Pulse regular. Respirations normal and unlabored. Afebrile. . Eyes Nonicteric. Reactive to light. Ears, Nose, Mouth, and Throat Lips, teeth, and gums WNL.Marland Kitchen Moist mucosa without lesions. Neck supple and nontender. No palpable supraclavicular or cervical adenopathy. Normal sized without goiter. Respiratory WNL. No retractions.. Cardiovascular Pedal Pulses WNL. No clubbing, cyanosis or edema. Lymphatic No adneopathy. No adenopathy. No adenopathy. Musculoskeletal Adexa without tenderness or enlargement.. Digits and nails w/o clubbing, cyanosis, infection, petechiae, ischemia, or inflammatory conditions.. Integumentary (Hair, Skin) No suspicious lesions. No crepitus or fluctuance. No peri-wound warmth or erythema. No masses.Marland Kitchen Psychiatric Judgement and insight Intact.. No evidence of depression, anxiety, or agitation.. Notes the wound continues to have some debris and I sharply removed this with a #3 curet and bleeding controlled with pressure. Overall there is great improvement. Electronic Signature(s) Signed: 02/18/2016 2:34:15 PM By: Madison Fudge MD, FACS Entered By: [name] on 02/18/2016 14:34:14 [name] ([id number]) -------------------------------------------------------------------------------- Physician Orders Details Patient Name: [name] Date of Service: 02/18/2016 2:15 PM Medical Record Patient Account Number: [id number] Number: Afful, RN, BSN, Treating RN: November 17, 1935 (80 y.o. [name] Date of Birth/Sex: Female) Other Clinician: Primary Care Physician: [name] Treating [name] Referring Physician: Halina [name] Physician/Extender: [name] in Treatment: 11 Verbal / Phone Orders: Yes Clinician: Afful, RN, BSN, Rita Read Back and Verified: Yes Diagnosis Coding Wound Cleansing Wound #5 Right,Medial Calcaneus o Clean wound with Normal Saline. - in clinic o Cleanse wound with mild soap and water Anesthetic Wound #5 Right,Medial Calcaneus o Topical Lidocaine 4% cream applied to wound bed prior to debridement Primary Wound Dressing Wound #5 Right,Medial Calcaneus o Prisma Ag Secondary Dressing Wound #5 Right,Medial Calcaneus o Dry Gauze o Boardered Foam Dressing Dressing Change Frequency Wound #5 Right,Medial Calcaneus o Change dressing every other day. Follow-up Appointments o Return Appointment in 1 week. Off-Loading o Other: - float heels with pillow under calves when in bed; wear pressure relieving boots while in bed Additional Orders / Instructions Wound #5 Right,Medial Calcaneus o Increase protein intake. o Activity as tolerated - VItamin C,  A, ZINC, MVI [name], [name] ([id number]) Electronic Signature(s) Signed: 02/18/2016 2:52:21 PM By: [name] BSN, RN Signed: 02/18/2016 3:03:33 PM By: Madison Fudge MD, FACS Entered By: [name] on 02/18/2016 14:22:06 [name] ([id number]) -------------------------------------------------------------------------------- Problem List Details Patient Name: [name], [name]. Date of Service: 02/18/2016 2:15 PM Medical Record Patient Account Number: [id number] Number: Afful, RN, BSN, Treating RN: Mar 14, 1936 (80 y.o. [name] Date of Birth/Sex: Female) Other Clinician: Primary Care Physician: [name] Treating [name] Referring Physician: Halina [name] Physician/Extender: [name] in Treatment: 11 Active Problems ICD-10 Encounter Code Description Active Date Diagnosis E11.621 Type 2 diabetes mellitus with foot ulcer 12/02/2015 Yes L89.613 Pressure ulcer of right heel, stage 3  12/02/2015 Yes Z79.01 Long term (current) use of anticoagulants 12/02/2015 Yes Inactive Problems Resolved Problems ICD-10 Code Description Active Date Resolved Date L97.212 Non-pressure chronic ulcer of right calf with fat layer 12/02/2015 12/02/2015 exposed L97.211 Non-pressure chronic ulcer of right calf limited to 12/02/2015 12/02/2015 breakdown of skin Electronic Signature(s) Signed: 02/18/2016 2:33:01 PM By: Madison Fudge MD, FACS Entered By: [name] on 02/18/2016 14:33:00 [name] ([id number]) -------------------------------------------------------------------------------- Progress Note Details Patient Name: [name] Date of Service: 02/18/2016 2:15 PM Medical Record Patient Account Number: [id number] Number: Afful, RN, BSN, Treating RN: 1935/08/19 (80 y.o. [name] Date of Birth/Sex: Female) Other Clinician: Primary Care Physician: [name] Treating [name] Referring Physician: Halina [name] Physician/Extender: [name] in Treatment: 11 Subjective Chief Complaint Information obtained from Patient Patient is at the clinic for treatment of an open pressure ulcer to her right calcaneum which has been there for about 4 weeks History of Present Illness (HPI) The following HPI elements were documented for the patient's wound: Location: right posterior heel, and both lower extremities Quality: Patient reports experiencing a sharp pain to affected area(s). Severity: Patient states wound (s) are getting better. Duration: Patient has had the wound for < 4 weeks prior to presenting for treatment Timing: Pain in wound is Intermittent (comes and goes Context: The wound appeared gradually over time while she was in hospital with a right hip fracture Modifying Factors: Consults to this date include:was seen by her PCP and put on some antibiotics a while ago Associated Signs and Symptoms: Patient reports having difficulty standing for long  periods. 80 year old patient referred who was recently discharged in April of this year with a left heel ulcer now comes with a right heel ulcer and some ulcerations in both lower extremities which have all come along during her recent hospitalization which she had a right hip fracture. She has a past medical history of COPD, diabetes mellitus type 2 with chronic kidney disease, multi-infarct dementia, anxiety, urinary retention, paroxysmal atrial fibrillation. She has also had moderate malnutrition, multi-infarct dementia, congestive heart failure, left displaced femoral neck fracture, systemic lupus erythematosus. She is also status total abdominal hysterectomy with bilateral salpingo-oophorectomy, EGDs, hip arthroplasty on the left, cataract surgery. She has been a from a smoker and quit in May 2013 and she smoked for about 40 years. recently admitted to [facility] between January 19 and 07/03/2015 12/09/2015 -- right foot x-ray -- IMPRESSION:Soft tissue ulceration of the heel. No underlying erosive bony abnormality. Diffuse degenerative change. No acute bony abnormality. 12/30/2015-- admitted to the hospital between July 12 and July 18 of 2017 and was treated for cerebral infarction, infected decubitus ulcer and aspiration pneumonia of the right lung. MRI was negative for stroke and was thought to be secondary to pneumonia. She was treated  with clindamycin. Patient was discharged home on Keflex and Zyvox. [name] ([id number]) Dr. Lucky [name] took her for an aortogram and a right lower extremity runoff and found to 30% stenosis in the mid to distal SFA but no significant stenosis in the popliteal artery and the SFA. There was a two-vessel runoff through the anterior tibial artery and peroneal artery without significant stenosis. felt her perfusion was adequate for wound healing. Objective Constitutional Pulse regular. Respirations normal and unlabored.  Afebrile. Vitals Time Taken: 2:10 PM, Height: 64 in, Weight: 110 lbs, BMI: 18.9, Temperature: 98.6 F, Pulse: 61 bpm, Respiratory Rate: 17 breaths/min, Blood Pressure: 159/66 mmHg. Eyes Nonicteric. Reactive to light. Ears, Nose, Mouth, and Throat Lips, teeth, and gums WNL.Marland Kitchen Moist mucosa without lesions. Neck supple and nontender. No palpable supraclavicular or cervical adenopathy. Normal sized without goiter. Respiratory WNL. No retractions.. Cardiovascular Pedal Pulses WNL. No clubbing, cyanosis or edema. Lymphatic No adneopathy. No adenopathy. No adenopathy. Musculoskeletal Adexa without tenderness or enlargement.. Digits and nails w/o clubbing, cyanosis, infection, petechiae, ischemia, or inflammatory conditions.Marland Kitchen Psychiatric Judgement and insight Intact.. No evidence of depression, anxiety, or agitation.. General Notes: the wound continues to have some debris and I sharply removed this with a #3 curet and bleeding controlled with pressure. Overall there is great improvement. Integumentary (Hair, Skin) No suspicious lesions. No crepitus or fluctuance. No peri-wound warmth or erythema. No masses.Marland Kitchen [name], [name] ([id number]) Wound #5 status is Open. Original cause of wound was Gradually Appeared. The wound is located on the Right,Medial Calcaneus. The wound measures 1.2cm length x 1cm width x 0.1cm depth; 0.942cm^2 area and 0.094cm^3 volume. The wound is limited to skin breakdown. There is no tunneling or undermining noted. There is a large amount of serosanguineous drainage noted. The wound margin is distinct with the outline attached to the wound base. There is large (67-100%) pink, pale granulation within the wound bed. There is a small (1-33%) amount of necrotic tissue within the wound bed including Adherent Slough. The periwound skin appearance exhibited: Moist. The periwound skin appearance did not exhibit: Callus, Crepitus, Excoriation, Fluctuance, Friable, Induration,  Localized Edema, Rash, Scarring, Dry/Scaly, Maceration, Atrophie Blanche, Cyanosis, Ecchymosis, Hemosiderin Staining, Mottled, Pallor, Rubor, Erythema. Periwound temperature was noted as No Abnormality. The periwound has tenderness on palpation. Assessment Active Problems ICD-10 E11.621 - Type 2 diabetes mellitus with foot ulcer L89.613 - Pressure ulcer of right heel, stage 3 Z79.01 - Long term (current) use of anticoagulants Procedures Wound #5 Wound #5 is a Diabetic Wound/Ulcer of the Lower Extremity located on the Right,Medial Calcaneus . There was a Skin/Subcutaneous Tissue Debridement [id number]) debridement with total area of 1.2 sq cm performed by Madison Fudge, MD. with the following instrument(s): Curette to remove Non-Viable tissue/material including Exudate, Fibrin/Slough, and Subcutaneous after achieving pain control using Lidocaine 4% Topical Solution. A time out was conducted at 14:18, prior to the start of the procedure. A Minimum amount of bleeding was controlled with Silver Nitrate. The procedure was tolerated well with a pain level of 0 throughout and a pain level of 0 following the procedure. Post Debridement Measurements: 1.2cm length x 1cm width x 0.1cm depth; 0.094cm^3 volume. Character of Wound/Ulcer Post Debridement is stable. Severity of Tissue Post Debridement is: Fat layer exposed. Post procedure Diagnosis Wound #5: Same as Pre-Procedure [name], [name] ([id number]) Plan Wound Cleansing: Wound #5 Right,Medial Calcaneus: Clean wound with Normal Saline. - in clinic Cleanse wound with mild soap and water Anesthetic: Wound #5 Right,Medial Calcaneus: Topical Lidocaine 4% cream applied  to wound bed prior to debridement Primary Wound Dressing: Wound #5 Right,Medial Calcaneus: Prisma Ag Secondary Dressing: Wound #5 Right,Medial Calcaneus: Dry Gauze Boardered Foam Dressing Dressing Change Frequency: Wound #5 Right,Medial Calcaneus: Change dressing every  other day. Follow-up Appointments: Return Appointment in 1 week. Off-Loading: Other: - float heels with pillow under calves when in bed; wear pressure relieving boots while in bed Additional Orders / Instructions: Wound #5 Right,Medial Calcaneus: Increase protein intake. Activity as tolerated - VItamin C, A, ZINC, MVI I have recommended: 1. Prisma AG to the wounds on the right heel. This is to be changed every other day 2. The right lower extremity will not need anymore dressings 3. Good control of her diabetes 4. Adequate nutrition with protein supplements, vitamin A, vitamin C and zinc 5. Regular visits to the wound center Electronic Signature(s) Signed: 02/18/2016 2:34:48 PM By: Madison Fudge MD, FACS Entered By: [name] on 02/18/2016 14:34:47 [name] ([id number]) -------------------------------------------------------------------------------- SuperBill Details Patient Name: [name] Date of Service: 02/18/2016 Medical Record Patient Account Number: [id number] Number: Afful, RN, BSN, Treating RN: 1936/02/06 (80 y.o. [name] Date of Birth/Sex: Female) Other Clinician: Primary Care Physician: [name] Treating [name] Referring Physician: Halina [name] Physician/Extender: [name] in Treatment: 11 Diagnosis Coding ICD-10 Codes Code Description E11.621 Type 2 diabetes mellitus with foot ulcer L89.613 Pressure ulcer of right heel, stage 3 Z79.01 Long term (current) use of anticoagulants Facility Procedures CPT4 Code: [id number] Description: [id number] - DEB SUBQ TISSUE 20 SQ CM/< ICD-10 Description Diagnosis E11.621 Type 2 diabetes mellitus with foot ulcer L89.613 Pressure ulcer of right heel, stage 3 Z79.01 Long term (current) use of anticoagulants Modifier: Quantity: 1 Physician Procedures CPT4 Code: [id number] Description: [id number] - WC PHYS SUBQ TISS 20 SQ CM ICD-10 Description Diagnosis E11.621 Type 2 diabetes mellitus with foot ulcer  L89.613 Pressure ulcer of right heel, stage 3 Z79.01 Long term (current) use of anticoagulants Modifier: Quantity: 1 Electronic Signature(s) Signed: 02/18/2016 2:35:01 PM By: Madison Fudge MD, FACS Entered By: [name] on 02/18/2016 14:35:00

## 2016-02-21 ENCOUNTER — Encounter: Payer: Self-pay | Admitting: Internal Medicine

## 2016-02-21 ENCOUNTER — Ambulatory Visit (INDEPENDENT_AMBULATORY_CARE_PROVIDER_SITE_OTHER): Payer: Medicare Other | Admitting: Internal Medicine

## 2016-02-21 ENCOUNTER — Ambulatory Visit
Admission: RE | Admit: 2016-02-21 | Discharge: 2016-02-21 | Disposition: A | Discharge status: Home / Self Care | Payer: Medicare Other | Source: Ambulatory Visit | Attending: Specialist | Admitting: Specialist

## 2016-02-21 VITALS — BP 122/80 | HR 72 | Ht 64.0 in | Wt 100.0 lb

## 2016-02-21 DIAGNOSIS — E1122 Type 2 diabetes mellitus with diabetic chronic kidney disease: Secondary | ICD-10-CM

## 2016-02-21 DIAGNOSIS — L89613 Pressure ulcer of right heel, stage 3: Secondary | ICD-10-CM | POA: Diagnosis not present

## 2016-02-21 DIAGNOSIS — J439 Emphysema, unspecified: Secondary | ICD-10-CM | POA: Insufficient documentation

## 2016-02-21 DIAGNOSIS — I2510 Atherosclerotic heart disease of native coronary artery without angina pectoris: Secondary | ICD-10-CM | POA: Diagnosis not present

## 2016-02-21 DIAGNOSIS — I480 Paroxysmal atrial fibrillation: Secondary | ICD-10-CM | POA: Diagnosis not present

## 2016-02-21 DIAGNOSIS — J449 Chronic obstructive pulmonary disease, unspecified: Secondary | ICD-10-CM | POA: Diagnosis not present

## 2016-02-21 DIAGNOSIS — N181 Chronic kidney disease, stage 1: Secondary | ICD-10-CM | POA: Diagnosis not present

## 2016-02-21 DIAGNOSIS — R918 Other nonspecific abnormal finding of lung field: Secondary | ICD-10-CM

## 2016-02-21 DIAGNOSIS — S72001D Fracture of unspecified part of neck of right femur, subsequent encounter for closed fracture with routine healing: Secondary | ICD-10-CM | POA: Diagnosis not present

## 2016-02-21 DIAGNOSIS — I5032 Chronic diastolic (congestive) heart failure: Secondary | ICD-10-CM | POA: Diagnosis not present

## 2016-02-21 DIAGNOSIS — K219 Gastro-esophageal reflux disease without esophagitis: Secondary | ICD-10-CM | POA: Diagnosis not present

## 2016-02-21 DIAGNOSIS — E1142 Type 2 diabetes mellitus with diabetic polyneuropathy: Secondary | ICD-10-CM | POA: Diagnosis not present

## 2016-02-21 DIAGNOSIS — I639 Cerebral infarction, unspecified: Secondary | ICD-10-CM | POA: Diagnosis not present

## 2016-02-21 DIAGNOSIS — J90 Pleural effusion, not elsewhere classified: Secondary | ICD-10-CM | POA: Insufficient documentation

## 2016-02-21 DIAGNOSIS — I700 Atherosclerosis of aorta: Secondary | ICD-10-CM | POA: Diagnosis not present

## 2016-02-21 DIAGNOSIS — F0390 Unspecified dementia without behavioral disturbance: Secondary | ICD-10-CM | POA: Diagnosis not present

## 2016-02-21 DIAGNOSIS — L89614 Pressure ulcer of right heel, stage 4: Secondary | ICD-10-CM | POA: Diagnosis not present

## 2016-02-21 MED ORDER — GLIMEPIRIDE 2 MG PO TABS: 2.0000 mg | ORAL_TABLET | Freq: Every day | ORAL | 1 refills | Status: DC

## 2016-02-21 NOTE — Progress Notes (Signed)
Date:  02/21/2016   Name:  [name]   DOB:  02/04/1936   MRN:  [id number]   Chief Complaint: Diabetes (questions concerning diabetic meds- not taking Metformin due to making her sick "nausea") and Follow-up (dr took her off xarelto due to reaction- does she need to cont "the other meds") Stopped metformin 2 weeks ago - no further nausea.  BS running around 220. Atrial Fibrillation - patient thinks she has been in SR.  Husband feels the same.  She stopped Eliquis due to severe hematuria and bruising.  She continues on Amiodarone and Metoprolol and wonders if she needs to continue. Husband thinks she has seen Dr. Fletcher Anon recently but I can not view his notes.   Review of Systems  Constitutional: Negative for chills, fatigue and fever.  Eyes: Negative for visual disturbance.  Respiratory: Positive for shortness of breath. Negative for chest tightness and wheezing.   Cardiovascular: Negative for chest pain, palpitations and leg swelling.  Skin: Positive for wound (right heel ulcer almost closed).  Neurological: Negative for dizziness and headaches.  Hematological: Negative for adenopathy.  Psychiatric/Behavioral: Positive for decreased concentration. Negative for sleep disturbance. The patient is not nervous/anxious.     Patient Active Problem List   Diagnosis Date Noted  . DNR (do not resuscitate) 12/29/2015  . Palliative care encounter 12/29/2015  . Dementia   . Demand ischemia ([place]) 12/23/2015  . Metabolic encephalopathy [id number]  . Sepsis ([place]) 12/23/2015  . Pneumonia 12/22/2015  . Closed right hip fracture ([place]) 11/04/2015  . Chronic respiratory failure with hypoxia ([place]) 10/27/2015  . Mixed Alzheimer's and vascular dementia 07/29/2015  . Type 2 diabetes mellitus with stage 1 chronic kidney disease, without long-term current use of insulin ([place]) 07/16/2015  . Tremor observed on examination 07/16/2015  . Urinary retention 07/12/2015  . Stage III pressure ulcer of  right heel ([place]) 07/12/2015  . Malnutrition of moderate degree 07/03/2015  . 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 Paroxysmal atrial fibrillation ([place]) 06/25/2015  . Chronic diastolic CHF (congestive heart failure) ([place])   . Left displaced femoral neck fracture ([place])   . Fall   . GERD (gastroesophageal reflux disease) 03/26/2015  . Anxiety 03/26/2015  . Gastroduodenal ulcer 01/05/2015  . Difficulty swallowing solids   . Esophageal candidiasis ([place])   . Acquired hypothyroidism 08/18/2014  . Systemic lupus erythematosus ([place]) 04/03/2014    Prior to Admission medications   Medication Sig Start Date End Date Taking? Authorizing Provider  albuterol (PROVENTIL) (2.5 MG/3ML) 0.083% nebulizer solution Take 2.5 mg by nebulization every 6 (six) hours as needed for wheezing or shortness of breath. Reported on 07/09/2015   Yes Historical Provider, MD  amiodarone (PACERONE) 200 MG tablet Take 1 tablet (200 mg total) by mouth daily. 02/04/16  Yes Glean Hess, MD  feeding supplement, ENSURE ENLIVE, (ENSURE ENLIVE) LIQD Take 237 mLs by mouth 2 (two) times daily between meals. 11/10/15  Yes Srikar Sudini, MD  metoprolol tartrate (LOPRESSOR) 25 MG tablet Take 1 tablet (25 mg total) by mouth 2 (two) times daily. 02/15/16  Yes Glean Hess, MD  mometasone-formoterol [facility]) 100-5 MCG/ACT AERO Inhale 2 puffs into the lungs 2 (two) times daily. 07/16/15  Yes Glean Hess, MD  ranitidine (ZANTAC) 150 MG tablet Take 1 tablet (150 mg total) by mouth 2 (two) times daily. 02/03/16  Yes Glean Hess, MD  senna (SENOKOT) 8.6 MG TABS tablet Take 1 tablet by mouth 2 (two) times daily.  Yes Historical Provider, MD  tiotropium (SPIRIVA) 18 MCG inhalation capsule Place 1 capsule (18 mcg total) into inhaler and inhale daily. 06/22/15  Yes Glean Hess, MD  cephALEXin (KEFLEX) 250 MG capsule Take 1 capsule (250 mg total) by mouth 3 (three) times daily. Patient not taking: Reported on 02/18/2016 12/28/15   Hillary Bow, MD  collagenase (SANTYL) ointment Apply topically daily. Patient not taking: Reported on 02/18/2016 12/28/15   Hillary Bow, MD  docusate sodium (COLACE) 100 MG capsule Take 1 capsule (100 mg total) by mouth 2 (two) times daily. Patient not taking: Reported on 02/18/2016 11/10/15   Hillary Bow, MD  donepezil (ARICEPT) 5 MG tablet Take by mouth. 07/29/15   Historical Provider, MD  ferrous sulfate 325 (65 FE) MG tablet Take 1 tablet (325 mg total) by mouth 2 (two) times daily with a meal. Patient not taking: Reported on 02/18/2016 11/10/15   Srikar Sudini, MD  JANUVIA 100 MG tablet Take 0.5 tablets (50 mg total) by mouth daily. Patient taking differently: Take 100 mg by mouth daily.  02/04/16   Glean Hess, MD  linezolid (ZYVOX) 600 MG tablet Take 1 tablet (600 mg total) by mouth every 12 (twelve) hours. Patient not taking: Reported on 02/18/2016 12/28/15   Hillary Bow, MD  LORazepam (ATIVAN) 1 MG tablet Take 1 tablet (1 mg total) by mouth at bedtime. Patient not taking: Reported on 02/18/2016 11/10/15   Hillary Bow, MD  memantine (NAMENDA) 5 MG tablet Take 5 mg by mouth 2 (two) times daily.    Historical Provider, MD  metFORMIN (GLUCOPHAGE) 500 MG tablet Take 3 tablets (1,500 mg total) by mouth daily. Patient not taking: Reported on 02/18/2016 01/06/16   Glean Hess, MD  oxyCODONE (OXY IR/ROXICODONE) 5 MG immediate release tablet Take 1 tablet (5 mg total) by mouth every 4 (four) hours as needed for severe pain ((for MODERATE breakthrough pain)). Patient not taking: Reported on 02/18/2016 11/10/15   Hillary Bow, MD  Probiotic Product (ALIGN) 4 MG CAPS Take 1 capsule (4 mg total) by mouth daily. Patient not taking: Reported on 02/21/2016 06/22/15   Glean Hess, MD  rivaroxaban (XARELTO) 20 MG TABS tablet Take by mouth.    Historical Provider, MD    Allergies  Allergen Reactions  . Codeine Nausea And Vomiting  . Penicillins Hives    Has patient had a PCN reaction causing immediate  rash, facial/tongue/throat swelling, SOB or lightheadedness with hypotension: no  Has patient had a PCN reaction causing severe rash involving mucus membranes or skin necrosis: no  Has patient had a PCN reaction that required hospitalization: no  Has patient had a PCN reaction occurring within the last 10 years: no  If all of the above answers are "NO", then may proceed with Cephalosporin use.   . Sulfa Antibiotics   . Xarelto [Rivaroxaban]   . Doxycycline Rash  . Erythromycin Rash  . Latex Rash    Past Surgical History:  Procedure Laterality Date  . BIOPSY THYROID    . BREAST BIOPSY    . CATARACT EXTRACTION W/PHACO Left 03/23/2015   Procedure: CATARACT EXTRACTION PHACO AND INTRAOCULAR LENS PLACEMENT (IOC);  Surgeon: Birder Robson, MD;  Location: [facility];  Service: Ophthalmology;  Laterality: Left;  [place] 00:51   . CATARACT EXTRACTION W/PHACO Right 05/25/2015   Procedure: CATARACT EXTRACTION PHACO AND INTRAOCULAR LENS PLACEMENT (IOC);  Surgeon: Birder Robson, MD;  Location: [facility];  Service: Ophthalmology;  Laterality: Right;  [place] 00:57   .  COLONOSCOPY  multiple  . ESOPHAGOGASTRODUODENOSCOPY  multiple  . ESOPHAGOGASTRODUODENOSCOPY (EGD) WITH PROPOFOL N/A 12/11/2014   Procedure: ESOPHAGOGASTRODUODENOSCOPY (EGD) WITH PROPOFOL;  Surgeon: [name], MD;  Location: [place];  Service: Endoscopy;  Laterality: N/A;  cytology brushing  . HIP ARTHROPLASTY Left 03/26/2015   Procedure: ARTHROPLASTY BIPOLAR HIP (HEMIARTHROPLASTY);  Surgeon: Earnestine Leys, MD;  Location: [facility];  Service: Orthopedics;  Laterality: Left;  . HIP ARTHROPLASTY Right 11/06/2015   Procedure: ARTHROPLASTY BIPOLAR HIP (HEMIARTHROPLASTY);  Surgeon: Thornton Park, MD;  Location: [facility];  Service: Orthopedics;  Laterality: Right;  . JOINT REPLACEMENT Left   . PERIPHERAL VASCULAR CATHETERIZATION N/A 12/27/2015   Procedure: Lower Extremity Angiography;  Surgeon: Algernon Huxley, MD;  Location: [facility];  Service: Cardiovascular;  Laterality: N/A;  . PERIPHERAL VASCULAR CATHETERIZATION  12/27/2015   Procedure: Lower Extremity Intervention;  Surgeon: Algernon Huxley, MD;  Location: [place] [facility];  Service: Cardiovascular;;  . TOTAL ABDOMINAL HYSTERECTOMY W/ BILATERAL SALPINGOOPHORECTOMY  1990    Social History  Substance Use Topics  . Smoking status: Former Smoker    Packs/day: 0.25    Years: 40.00    Types: Cigarettes    Quit date: 10/11/2011  . Smokeless tobacco: Never Used     Comment: Quit 3 years  . Alcohol use No     Medication list has been reviewed and updated.   Physical Exam  Constitutional: She appears well-developed. No distress.  HENT:  Head: Normocephalic and atraumatic.  Cardiovascular: Normal rate, regular rhythm and normal heart sounds.   Pulmonary/Chest: Effort normal. No respiratory distress. She has no wheezes. She has no rales.  Abdominal: Soft. There is no tenderness.  Musculoskeletal: Normal range of motion.  Neurological: She is alert.  Skin: Skin is warm and dry. No rash noted.  Psychiatric: She has a normal mood and affect. Her behavior is normal. Thought content normal.  Nursing note and vitals reviewed.   BP 122/80   Pulse 72   Ht 5\' 4"  (1.626 m)   Wt 100 lb (45.4 kg)   BMI 17.16 kg/m   Assessment and Plan: 1. Paroxysmal atrial fibrillation (HCC) In SR today - continue amiodarone and metoprolol Consult Dr. Tyrell Antonio office regarding follow up  2. Type 2 diabetes mellitus with stage 1 chronic kidney disease, without long-term current use of insulin (HCC) Continue Januvia Stop metformin Call in 2 weeks to report BS if not improved - glimepiride (AMARYL) 2 MG tablet; Take 1 tablet (2 mg total) by mouth daily before breakfast.  Dispense: 30 tablet; Refill: 1  3. Gastroesophageal reflux disease, esophagitis presence not specified Continue zantac bid  4. Stage III pressure ulcer of right heel ([place]) Almost healed - follow up with wound  care as needed   Halina Maidens, MD [facility]  02/21/2016

## 2016-02-22 ENCOUNTER — Other Ambulatory Visit: Payer: Self-pay

## 2016-02-22 MED ORDER — TIOTROPIUM BROMIDE MONOHYDRATE 18 MCG IN CAPS: 18.0000 ug | ORAL_CAPSULE | Freq: Every day | RESPIRATORY_TRACT | 1 refills | Status: AC

## 2016-02-23 ENCOUNTER — Telehealth: Payer: Self-pay

## 2016-02-23 DIAGNOSIS — L89614 Pressure ulcer of right heel, stage 4: Secondary | ICD-10-CM | POA: Diagnosis not present

## 2016-02-23 DIAGNOSIS — J449 Chronic obstructive pulmonary disease, unspecified: Secondary | ICD-10-CM | POA: Diagnosis not present

## 2016-02-23 DIAGNOSIS — F0390 Unspecified dementia without behavioral disturbance: Secondary | ICD-10-CM | POA: Diagnosis not present

## 2016-02-23 DIAGNOSIS — E1142 Type 2 diabetes mellitus with diabetic polyneuropathy: Secondary | ICD-10-CM | POA: Diagnosis not present

## 2016-02-23 DIAGNOSIS — S72001D Fracture of unspecified part of neck of right femur, subsequent encounter for closed fracture with routine healing: Secondary | ICD-10-CM | POA: Diagnosis not present

## 2016-02-23 DIAGNOSIS — I5032 Chronic diastolic (congestive) heart failure: Secondary | ICD-10-CM | POA: Diagnosis not present

## 2016-02-23 NOTE — Telephone Encounter (Signed)
[facility] nurse called reporting that that the patient caregiver was complaining of foul urine smell and dark urine, but she is asymptomatic. No urinary frequency or dysuria. She stated that the caregiver informed her that you told her if the patient is asymptomatic then no need to check the urine. I spoke w/ [name] and she reviewed the pt chart and verbally informed me to call the caregiver and tell them to increase her fluid intake. I called [name] who informed me that his wife urine is not dark in color and no foul odor. He confirmed that he just came in to see [place] and all is well.

## 2016-02-25 DIAGNOSIS — R269 Unspecified abnormalities of gait and mobility: Secondary | ICD-10-CM | POA: Diagnosis not present

## 2016-02-25 DIAGNOSIS — G309 Alzheimer's disease, unspecified: Secondary | ICD-10-CM | POA: Diagnosis not present

## 2016-02-25 DIAGNOSIS — F0150 Vascular dementia without behavioral disturbance: Secondary | ICD-10-CM | POA: Diagnosis not present

## 2016-02-25 DIAGNOSIS — F0280 Dementia in other diseases classified elsewhere without behavioral disturbance: Secondary | ICD-10-CM | POA: Diagnosis not present

## 2016-02-28 ENCOUNTER — Encounter: Payer: Medicare Other | Admitting: Surgery

## 2016-02-28 DIAGNOSIS — L89613 Pressure ulcer of right heel, stage 3: Secondary | ICD-10-CM | POA: Diagnosis not present

## 2016-02-28 DIAGNOSIS — E1122 Type 2 diabetes mellitus with diabetic chronic kidney disease: Secondary | ICD-10-CM | POA: Diagnosis not present

## 2016-02-28 DIAGNOSIS — L89614 Pressure ulcer of right heel, stage 4: Secondary | ICD-10-CM | POA: Diagnosis not present

## 2016-02-28 DIAGNOSIS — J449 Chronic obstructive pulmonary disease, unspecified: Secondary | ICD-10-CM | POA: Diagnosis not present

## 2016-02-28 DIAGNOSIS — E11621 Type 2 diabetes mellitus with foot ulcer: Secondary | ICD-10-CM | POA: Diagnosis not present

## 2016-02-28 DIAGNOSIS — E1142 Type 2 diabetes mellitus with diabetic polyneuropathy: Secondary | ICD-10-CM | POA: Diagnosis not present

## 2016-02-28 DIAGNOSIS — F0390 Unspecified dementia without behavioral disturbance: Secondary | ICD-10-CM | POA: Diagnosis not present

## 2016-02-28 DIAGNOSIS — Z7901 Long term (current) use of anticoagulants: Secondary | ICD-10-CM | POA: Diagnosis not present

## 2016-02-28 DIAGNOSIS — I5032 Chronic diastolic (congestive) heart failure: Secondary | ICD-10-CM | POA: Diagnosis not present

## 2016-02-28 DIAGNOSIS — S72001D Fracture of unspecified part of neck of right femur, subsequent encounter for closed fracture with routine healing: Secondary | ICD-10-CM | POA: Diagnosis not present

## 2016-02-28 DIAGNOSIS — L97411 Non-pressure chronic ulcer of right heel and midfoot limited to breakdown of skin: Secondary | ICD-10-CM | POA: Diagnosis not present

## 2016-02-28 DIAGNOSIS — S51802A Unspecified open wound of left forearm, initial encounter: Secondary | ICD-10-CM | POA: Diagnosis not present

## 2016-02-29 DIAGNOSIS — S72001D Fracture of unspecified part of neck of right femur, subsequent encounter for closed fracture with routine healing: Secondary | ICD-10-CM | POA: Diagnosis not present

## 2016-02-29 DIAGNOSIS — F0390 Unspecified dementia without behavioral disturbance: Secondary | ICD-10-CM | POA: Diagnosis not present

## 2016-02-29 DIAGNOSIS — L89614 Pressure ulcer of right heel, stage 4: Secondary | ICD-10-CM | POA: Diagnosis not present

## 2016-02-29 DIAGNOSIS — I5032 Chronic diastolic (congestive) heart failure: Secondary | ICD-10-CM | POA: Diagnosis not present

## 2016-02-29 DIAGNOSIS — E1142 Type 2 diabetes mellitus with diabetic polyneuropathy: Secondary | ICD-10-CM | POA: Diagnosis not present

## 2016-02-29 DIAGNOSIS — J449 Chronic obstructive pulmonary disease, unspecified: Secondary | ICD-10-CM | POA: Diagnosis not present

## 2016-02-29 NOTE — Progress Notes (Signed)
TAMETRIA, PASCHALL ([id number]) Visit Report for 02/28/2016 Arrival Information Details Patient Name: [name], [name]. Date of Service: 02/28/2016 2:15 PM Medical Record Number: [id number] Patient Account Number: [id number] Date of Birth/Sex: 11-09-1935 (79 y.o. Female) Treating RN: Carolyne Fiscal, Debi Primary Care Physician: Halina Maidens Other Clinician: Referring Physician: Halina Maidens Treating Physician/Extender: Frann Rider in Treatment: 12 Visit Information History Since Last Visit All ordered tests and consults were completed: No Patient Arrived: Wheel Chair Added or deleted any medications: No Arrival Time: 14:01 Any new allergies or adverse reactions: No Accompanied By: husband, caregiver Had a fall or experienced change in No activities of daily living that may affect Transfer Assistance: EasyPivot Patient risk of falls: Lift Signs or symptoms of abuse/neglect since last No Patient Identification Verified: Yes visito Secondary Verification Process Yes Hospitalized since last visit: No Completed: Pain Present Now: No Patient Requires Transmission- No Based Precautions: Patient Has Alerts: Yes Patient Alerts: Patient on Blood Thinner Electronic Signature(s) Signed: 02/28/2016 4:53:07 PM By: Alric Quan Entered By: Alric Quan on 02/28/2016 14:02:19 [name] ([id number]) -------------------------------------------------------------------------------- Clinic Level of Care Assessment Details Patient Name: [name] Date of Service: 02/28/2016 2:15 PM Medical Record Number: [id number] Patient Account Number: [id number] Date of Birth/Sex: March 28, 1936 (79 y.o. Female) Treating RN: Carolyne Fiscal, Debi Primary Care Physician: Halina Maidens Other Clinician: Referring Physician: Halina Maidens Treating Physician/Extender: Frann Rider in Treatment: 12 Clinic Level of Care Assessment Items TOOL 4 Quantity Score X - Use when only an  EandM is performed on FOLLOW-UP visit 1 0 ASSESSMENTS - Nursing Assessment / Reassessment X - Reassessment of Co-morbidities (includes updates in patient status) 1 10 X - Reassessment of Adherence to Treatment Plan 1 5 ASSESSMENTS - Wound and Skin Assessment / Reassessment []  - Simple Wound Assessment / Reassessment - one wound 0 X - Complex Wound Assessment / Reassessment - multiple wounds 2 5 []  - Dermatologic / Skin Assessment (not related to wound area) 0 ASSESSMENTS - Focused Assessment []  - Circumferential Edema Measurements - multi extremities 0 []  - Nutritional Assessment / Counseling / Intervention 0 []  - Lower Extremity Assessment (monofilament, tuning fork, pulses) 0 []  - Peripheral Arterial Disease Assessment (using hand held doppler) 0 ASSESSMENTS - Ostomy and/or Continence Assessment and Care []  - Incontinence Assessment and Management 0 []  - Ostomy Care Assessment and Management (repouching, etc.) 0 PROCESS - Coordination of Care []  - Simple Patient / Family Education for ongoing care 0 X - Complex (extensive) Patient / Family Education for ongoing care 1 20 X - Staff obtains Programmer, systems, Records, Test Results / Process Orders 1 10 X - Staff telephones HHA, Nursing Homes / Clarify orders / etc 1 10 []  - Routine Transfer to another Facility (non-emergent condition) 0 [name], [name] ([id number]) []  - Routine Hospital Admission (non-emergent condition) 0 []  - New Admissions / Biomedical engineer / Ordering NPWT, Apligraf, etc. 0 []  - Emergency Hospital Admission (emergent condition) 0 []  - Simple Discharge Coordination 0 []  - Complex (extensive) Discharge Coordination 0 PROCESS - Special Needs []  - Pediatric / Minor Patient Management 0 []  - Isolation Patient Management 0 []  - Hearing / Language / Visual special needs 0 []  - Assessment of Community assistance (transportation, D/C planning, etc.) 0 []  - Additional assistance / Altered mentation 0 []  - Support  Surface(s) Assessment (bed, cushion, seat, etc.) 0 INTERVENTIONS - Wound Cleansing / Measurement []  - Simple Wound Cleansing - one wound 0 X - Complex Wound Cleansing - multiple wounds 2 5 X -  Wound Imaging (photographs - any number of wounds) 1 5 []  - Wound Tracing (instead of photographs) 0 []  - Simple Wound Measurement - one wound 0 X - Complex Wound Measurement - multiple wounds 2 5 INTERVENTIONS - Wound Dressings X - Small Wound Dressing one or multiple wounds 1 10 X - Medium Wound Dressing one or multiple wounds 1 15 []  - Large Wound Dressing one or multiple wounds 0 []  - Application of Medications - topical 0 []  - Application of Medications - injection 0 INTERVENTIONS - Miscellaneous []  - External ear exam 0 [name], [name] ([id number]) []  - Specimen Collection (cultures, biopsies, blood, body fluids, etc.) 0 []  - Specimen(s) / Culture(s) sent or taken to Lab for analysis 0 []  - Patient Transfer (multiple staff / Harrel Lemon Lift / Similar devices) 0 []  - Simple Staple / Suture removal (25 or less) 0 []  - Complex Staple / Suture removal (26 or more) 0 []  - Hypo / Hyperglycemic Management (close monitor of Blood Glucose) 0 []  - Ankle / Brachial Index (ABI) - do not check if billed separately 0 X - Vital Signs 1 5 Has the patient been seen at the hospital within the last three years: Yes Total Score: 120 Level Of Care: New/Established - Level 4 Electronic Signature(s) Signed: 02/28/2016 4:53:07 PM By: Alric Quan Entered By: Alric Quan on 02/28/2016 16:27:49 [name] ([id number]) -------------------------------------------------------------------------------- Encounter Discharge Information Details Patient Name: [name] Date of Service: 02/28/2016 2:15 PM Medical Record Number: [id number] Patient Account Number: [id number] Date of Birth/Sex: [birth year] (79 y.o. Female) Treating RN: Ahmed Prima Primary Care Physician: Halina Maidens Other  Clinician: Referring Physician: Halina Maidens Treating Physician/Extender: Frann Rider in Treatment: 12 Encounter Discharge Information Items Discharge Pain Level: 0 Discharge Condition: Stable Ambulatory Status: Wheelchair Discharge Destination: Home Transportation: Private Auto husband and Accompanied By: caregiver Schedule Follow-up Appointment: Yes Medication Reconciliation completed and provided to Patient/Care Yes Sabrina Arriaga: Clinical Summary of Care: Electronic Signature(s) Signed: 02/28/2016 4:53:07 PM By: Alric Quan Previous Signature: 02/28/2016 2:54:11 PM Version By: Ruthine Dose Entered By: Alric Quan on 02/28/2016 14:54:36 [name] ([id number]) -------------------------------------------------------------------------------- Lower Extremity Assessment Details Patient Name: [name] Date of Service: 02/28/2016 2:15 PM Medical Record Number: [id number] Patient Account Number: [id number] Date of Birth/Sex: [birth year] (79 y.o. Female) Treating RN: Carolyne Fiscal, Debi Primary Care Physician: Halina Maidens Other Clinician: Referring Physician: Halina Maidens Treating Physician/Extender: Frann Rider in Treatment: 12 Vascular Assessment Pulses: Posterior Tibial Dorsalis Pedis Palpable: [Right:Yes] Extremity colors, hair growth, and conditions: Extremity Color: [Right:Normal] Temperature of Extremity: [Right:Warm] Capillary Refill: [Right:< 3 seconds] Toe Nail Assessment Left: Right: Thick: No Discolored: No Deformed: No Improper Length and Hygiene: No Electronic Signature(s) Signed: 02/28/2016 4:53:07 PM By: Alric Quan Entered By: Alric Quan on 02/28/2016 14:09:07 [name] ([id number]) -------------------------------------------------------------------------------- Multi Wound Chart Details Patient Name: [name] Date of Service: 02/28/2016 2:15 PM Medical Record Number:  [id number] Patient Account Number: [id number] Date of Birth/Sex: [birth year] (79 y.o. 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] [7:Trauma, Other] [N/A:N/A] Comorbid History: [5:Chronic Obstructive Pulmonary Disease (COPD), Angina, Congestive  Heart Failure, Type II Diabetes, Lupus Erythematosus, Osteoarthritis, Dementia] [7:Chronic Obstructive Pulmonary Disease (COPD), Angina, Congestive Heart Failure, Type  II Diabetes, Lupus Erythematosus, Osteoarthritis, Dementia] [N/A:N/A] Date Acquired: [5:11/03/2015] [7:02/24/2016] [N/A:N/A] Weeks of Treatment: [5:12] [7:0] [N/A:N/A] Wound Status: [5:Open] [7:Open] [N/A:N/A] Measurements L x W x D 0.5x0.5x0.5 [7:3.5x2.5x0.1] [N/A:N/A] (cm) Area (cm) : [5:0.196] [7:6.872] [N/A:N/A] Volume (cm) : [5:0.098] [7:0.687] [N/A:N/A] % Reduction in Area: [5:97.90%] [7:N/A] [N/A:N/A] % Reduction in Volume: 89.70% [7:N/A] [N/A:N/A] Classification: [5:Grade 1] [7:Partial Thickness] [N/A:N/A] Exudate Amount: [5:Large] [7:Large] [N/A:N/A] Exudate Type: [5:Serosanguineous] [7:Serosanguineous] [N/A:N/A] Exudate Color: [5:red, brown] [7:red, brown] [N/A:N/A] Foul Odor After [5:Yes] [7:No] [N/A:N/A] Cleansing: Odor Anticipated Due to No [7:N/A] [N/A:N/A] Product Use: Wound Margin: [5:Distinct, outline attached] [7:Distinct, outline attached N/A] Granulation Amount: Large (67-100%) Large  (67-100%) N/A Granulation Quality: Pink, Pale Red N/A Necrotic Amount: Small (1-33%) Small (1-33%) N/A Necrotic Tissue: Adherent Slough Eschar N/A Exposed Structures: Fascia: No N/A N/A Fat: No Tendon: No Muscle: No Joint: No Bone: No Limited to Skin Breakdown Epithelialization: Medium (34-66%) None N/A Periwound Skin Texture: Edema: No No Abnormalities Noted N/A Excoriation: No Induration: No Callus: No Crepitus: No Fluctuance: No Friable: No Rash: No Scarring: No Periwound Skin Moist: Yes Moist: Yes N/A Moisture: Maceration: No Dry/Scaly: No Periwound Skin Color: Atrophie Blanche: No No Abnormalities Noted N/A Cyanosis: No Ecchymosis: No Erythema: No Hemosiderin Staining: No Mottled: No Pallor: No Rubor: No Temperature: No Abnormality No Abnormality N/A Tenderness on Yes Yes N/A Palpation: Wound Preparation: Ulcer Cleansing: Ulcer Cleansing: N/A Rinsed/Irrigated with Rinsed/Irrigated with Saline Saline Topical Anesthetic Topical Anesthetic Applied: Other: lidocaine Applied: Other: lidocaine 4% 4% Treatment Notes Electronic Signature(s) Signed: 02/28/2016 4:53:07 PM By: Alric Quan Entered By: Alric Quan on 02/28/2016 14:29:33 [name] ([id number]) [name] ([id number]) -------------------------------------------------------------------------------- [place] Details Patient Name: [name], [name]. Date of Service: 02/28/2016 2:15 PM Medical Record Number: [id number] Patient Account Number: [id number] Date of Birth/Sex: 1935/08/06 (79 y.o. Female) Treating RN: Carolyne Fiscal, Debi Primary Care Physician: Halina Maidens Other Clinician: Referring Physician: Halina Maidens Treating Physician/Extender: Frann Rider in Treatment: 12 Active Inactive Abuse / Safety / Falls / Self Care Management Nursing Diagnoses: Potential for falls Goals: Patient/caregiver will verbalize understanding of skin care  regimen Date Initiated: 12/02/2015 Goal Status: Active Patient/caregiver will verbalize/demonstrate measures taken to prevent injury and/or falls Date Initiated: 12/02/2015 Goal Status: Active Interventions: Assess fall risk on admission and as needed Assess: immobility, friction, shearing, incontinence upon admission and as needed Assess impairment of mobility on admission and as needed per policy Assess self care needs on admission and as needed Provide education on fall prevention Provide education on safe transfers Notes: Wound/Skin Impairment Nursing Diagnoses: Impaired tissue integrity Goals: Patient/caregiver will verbalize understanding of skin care regimen Date Initiated: 12/02/2015 Goal Status: Active Interventions: Assess patient/caregiver ability to obtain necessary supplies Assess patient/caregiver ability to perform ulcer/skin care regimen upon admission and as needed [name], [name] ([id number]) Assess ulceration(s) every visit Provide education on ulcer and skin care Notes: Electronic Signature(s) Signed: 02/28/2016 4:53:07 PM By: Alric Quan Entered By: Alric Quan on 02/28/2016 14:29:13 [name] ([id number]) -------------------------------------------------------------------------------- Pain Assessment Details Patient Name: [name] Date of Service: 02/28/2016 2:15 PM Medical Record Number: [id number] Patient Account Number: [id number] Date of Birth/Sex: 02-14-1936 (79 y.o. Female) Treating RN: Ahmed Prima Primary Care Physician: Halina Maidens Other Clinician: Referring Physician: Halina Maidens Treating Physician/Extender: Frann Rider in Treatment: 12 Active Problems Location of Pain Severity and Description of Pain Patient Has  Paino No Site Locations With Dressing Change: No Pain Management and Medication Current Pain Management: Electronic Signature(s) Signed: 02/28/2016 4:53:07 PM By: Alric Quan Entered By: Alric Quan on 02/28/2016 14:02:36 [name] ([id number]) -------------------------------------------------------------------------------- Patient/Caregiver Education Details Patient Name: [name] Date of Service: 02/28/2016 2:15 PM Medical Record Number: [id number] Patient Account Number: [id number] Date of Birth/Gender: Aug 06, 1935 (80 y.o. Female) Treating RN: Ahmed Prima Primary Care Physician: Halina Maidens Other Clinician: Referring Physician: Halina Maidens Treating Physician/Extender: Frann Rider in Treatment: 12 Education Assessment Education Provided To: Patient Education Topics Provided Wound/Skin Impairment: Handouts: Other: change dressing as ordered Methods: Demonstration, Explain/Verbal Responses: State content correctly Electronic Signature(s) Signed: 02/28/2016 4:53:07 PM By: Alric Quan Entered By: Alric Quan on 02/28/2016 14:58:20 [name] ([id number]) -------------------------------------------------------------------------------- Wound Assessment Details Patient Name: [name] Date of Service: 02/28/2016 2:15 PM Medical Record Number: [id number] Patient Account Number: [id number] Date of Birth/Sex: Oct 21, 1935 (79 y.o. Female) Treating RN: Carolyne Fiscal, Debi Primary Care Physician: Halina Maidens Other Clinician: Referring Physician: Halina Maidens Treating Physician/Extender: Frann Rider in Treatment: 12 Wound Status Wound Number: 5 Primary Diabetic Wound/Ulcer of the Lower Etiology: Extremity Wound Location: Right Calcaneus - Medial Wound Open Wounding Event: Gradually Appeared Status: Date Acquired: 11/03/2015 Comorbid Chronic Obstructive Pulmonary Disease Weeks Of Treatment: 12 History: (COPD), Angina, Congestive Heart Clustered Wound: No Failure, Type II Diabetes, Lupus Erythematosus, Osteoarthritis, Dementia Photos Photo Uploaded By: Alric Quan on  02/28/2016 15:40:42 Wound Measurements Length: (cm) 0.5 Width: (cm) 0.5 Depth: (cm) 0.5 Area: (cm) 0.196 Volume: (cm) 0.098 % Reduction in Area: 97.9% % Reduction in Volume: 89.7% Epithelialization: Medium (34-66%) Tunneling: No Undermining: No Wound Description Classification: Grade 1 Wound Margin: Distinct, outline attached Exudate Amount: Large Exudate Type: Serosanguineous Exudate Color: red, brown Foul Odor After Cleansing: Yes Due to Product Use: No Wound Bed Granulation Amount: Large (67-100%) Exposed Structure Granulation Quality: Pink, Pale Fascia Exposed: No Necrotic Amount: Small (1-33%) Fat Layer Exposed: No [name], [name] ([id number]) Necrotic Quality: Adherent Slough Tendon Exposed: No Muscle Exposed: No Joint Exposed: No Bone Exposed: No Limited to Skin Breakdown Periwound Skin Texture Texture Color No Abnormalities Noted: No No Abnormalities Noted: No Callus: No Atrophie Blanche: No Crepitus: No Cyanosis: No Excoriation: No Ecchymosis: No Fluctuance: No Erythema: No Friable: No Hemosiderin Staining: No Induration: No Mottled: No Localized Edema: No Pallor: No Rash: No Rubor: No Scarring: No Temperature / Pain Moisture Temperature: No Abnormality No Abnormalities Noted: No Tenderness on Palpation: Yes Dry / Scaly: No Maceration: No Moist: Yes Wound Preparation Ulcer Cleansing: Rinsed/Irrigated with Saline Topical Anesthetic Applied: Other: lidocaine 4%, Treatment Notes Wound #5 (Right, Medial Calcaneus) 1. Cleansed with: Clean wound with Normal Saline 2. Anesthetic Topical Lidocaine 4% cream to wound bed prior to debridement 4. Dressing Applied: Prisma Ag 5. Secondary Dressing Applied Bordered Foam Dressing Dry Gauze Notes stretch net Electronic Signature(s) Signed: 02/28/2016 4:53:07 PM By: Alric Quan Entered By: Alric Quan on 02/28/2016 14:22:29 [name] ([name]  ([id number]) -------------------------------------------------------------------------------- Wound Assessment Details Patient Name: [name] Date of Service: 02/28/2016 2:15 PM Medical Record Number: [id number] Patient Account Number: [id number] Date of Birth/Sex: 07-22-1935 (79 y.o. Female) Treating RN: Ahmed Prima Primary Care Physician: Halina Maidens Other Clinician: Referring Physician: Halina Maidens Treating Physician/Extender: Frann Rider in Treatment: 12 Wound Status Wound Number: 7 Primary Trauma, Other Etiology: Wound Location: Forearm - Anterior Wound Open Wounding Event: Trauma Status: Date Acquired: 02/24/2016 Comorbid Chronic Obstructive Pulmonary Disease Weeks Of Treatment:  0 History: (COPD), Angina, Congestive Heart Clustered Wound: No Failure, Type II Diabetes, Lupus Erythematosus, Osteoarthritis, Dementia Photos Photo Uploaded By: Alric Quan on 02/28/2016 15:40:43 Wound Measurements Length: (cm) 3.5 Width: (cm) 2.5 Depth: (cm) 0.1 Area: (cm) 6.872 Volume: (cm) 0.687 % Reduction in Area: % Reduction in Volume: Epithelialization: None Tunneling: No Undermining: No Wound Description Classification: Partial Thickness Wound Margin: Distinct, outline attached Exudate Amount: Large Exudate Type: Serosanguineous Exudate Color: red, brown Foul Odor After Cleansing: No Wound Bed Granulation Amount: Large (67-100%) Granulation Quality: Red Necrotic Amount: Small (1-33%) [name], [name] ([id number]) Necrotic Quality: Eschar Periwound Skin Texture Texture Color No Abnormalities Noted: No No Abnormalities Noted: No Moisture Temperature / Pain No Abnormalities Noted: No Temperature: No Abnormality Moist: Yes Tenderness on Palpation: Yes Wound Preparation Ulcer Cleansing: Rinsed/Irrigated with Saline Topical Anesthetic Applied: Other: lidocaine 4%, Treatment Notes Wound #7 (Anterior Forearm) 1. Cleansed with: Clean  wound with Normal Saline 2. Anesthetic Topical Lidocaine 4% cream to wound bed prior to debridement 4. Dressing Applied: Other dressing (specify in notes) 5. Secondary Dressing Applied Gauze and Kerlix/Conform Notes mepitel, hydrogel, sorbact Electronic Signature(s) Signed: 02/28/2016 4:53:07 PM By: Alric Quan Entered By: Alric Quan on 02/28/2016 14:17:50 [name] ([id number]) -------------------------------------------------------------------------------- [place] Details Patient Name: [name] Date of Service: 02/28/2016 2:15 PM Medical Record Number: [id number] Patient Account Number: [id number] Date of Birth/Sex: [birth year] (79 y.o. Female) Treating RN: Carolyne Fiscal, Debi Primary Care Physician: Halina Maidens Other Clinician: Referring Physician: Halina Maidens Treating Physician/Extender: Frann Rider in Treatment: 12 Vital Signs Time Taken: 14:02 Temperature (F): 97.6 Height (in): 64 Pulse (bpm): 53 Weight (lbs): 110 Respiratory Rate (breaths/min): 16 Body Mass Index (BMI): 18.9 Blood Pressure (mmHg): 147/59 Reference Range: 80 - 120 mg / dl Notes Made Dr. Con Memos aware of pts HR and diastolic BP. Electronic Signature(s) Signed: 02/28/2016 4:53:07 PM By: Alric Quan Entered By: Alric Quan on 02/28/2016 14:59:21

## 2016-02-29 NOTE — Progress Notes (Signed)
[name], [name] ([id number]) Visit Report for 02/28/2016 Chief Complaint Document Details Patient Name: [name], [name] 02/28/2016 2:15 Date of Service: PM Medical Record [id number] Number: Patient Account Number: [id number] 05-Jan-1936 (80 y.o. Treating RN: Ahmed Prima Date of Birth/Sex: Female) Other Clinician: Primary Care Physician: Halina Maidens Treating [name] Referring Physician: Halina Maidens Physician/Extender: Suella Grove in Treatment: 12 Information Obtained from: Patient Chief Complaint Patient is at the clinic for treatment of an open pressure ulcer to her right calcaneum which has been there for about 4 weeks Electronic Signature(s) Signed: 02/28/2016 2:36:58 PM By: [name], FACS Entered By: [name] on 02/28/2016 14:36:58 [name] ([id number]) -------------------------------------------------------------------------------- HPI Details Patient Name: [name] 02/28/2016 2:15 Date of Service: PM Medical Record [id number] Number: Patient Account Number: [id number] [birth year] (80 y.o. Treating RN: Ahmed Prima Date of Birth/Sex: Female) Other Clinician: Primary Care Physician: Halina Maidens Treating [name] Referring Physician: Halina Maidens Physician/Extender: Suella Grove in Treatment: 12 History of Present Illness Location: right posterior heel, and both lower extremities Quality: Patient reports experiencing a sharp pain to affected area(s). Severity: Patient states wound (s) are getting better. Duration: Patient has had the wound for < 4 weeks prior to presenting for treatment Timing: Pain in wound is Intermittent (comes and goes Context: The wound appeared gradually over time while she was in hospital with a right hip fracture Modifying Factors: Consults to this date include:was seen by her PCP and put on some antibiotics a while ago Associated Signs and Symptoms: Patient reports having difficulty standing for  long periods. HPI Description: 80 year old patient referred who was recently discharged in April of this year with a left heel ulcer now comes with a right heel ulcer and some ulcerations in both lower extremities which have all come along during her recent hospitalization which she had a right hip fracture. She has a past medical history of COPD, diabetes mellitus type 2 with chronic kidney disease, multi-infarct dementia, anxiety, urinary retention, paroxysmal atrial fibrillation. She has also had moderate malnutrition, multi-infarct dementia, congestive heart failure, left displaced femoral neck fracture, systemic lupus erythematosus. She is also status total abdominal hysterectomy with bilateral salpingo-oophorectomy, EGDs, hip arthroplasty on the left, cataract surgery. She has been a from a smoker and quit in May 2013 and she smoked for about 40 years. recently admitted to [facility] between January 19 and 07/03/2015 12/09/2015 -- o right foot x-ray -- IMPRESSION:Soft tissue ulceration of the heel. No underlying erosive bony abnormality. Diffuse degenerative change. No acute bony abnormality. 12/30/2015-- admitted to the hospital between July 12 and July 18 of 2017 and was treated for cerebral infarction, infected decubitus ulcer and aspiration pneumonia of the right lung. MRI was negative for stroke and was thought to be secondary to pneumonia. She was treated with clindamycin. Patient was discharged home on Keflex and Zyvox. Dr. Lucky Cowboy took her for an aortogram and a right lower extremity runoff and found to 30% stenosis in the mid to distal SFA but no significant stenosis in the popliteal artery and the SFA. There was a two-vessel runoff through the anterior tibial artery and peroneal artery without significant stenosis. felt her perfusion was adequate for wound healing. 02/28/2016 -- days ago she had a lacerated wound on her left forearm which they have been treating  locally and have asked me to take a look today. [name], [name] ([id number]) Electronic Signature(s) Signed: 02/28/2016 2:38:01 PM By: [name], FACS Entered By: [name] on 02/28/2016 14:38:00 [name],  [name] ([id number]) -------------------------------------------------------------------------------- Physical Exam Details Patient Name: [name], [name] 02/28/2016 2:15 Date of Service: PM Medical Record [id number] Number: Patient Account Number: [id number] February 13, 1936 (80 y.o. Treating RN: Ahmed Prima Date of Birth/Sex: Female) Other Clinician: Primary Care Physician: Halina Maidens Treating [name] Referring Physician: Halina Maidens Physician/Extender: Suella Grove in Treatment: 12 Constitutional . Pulse regular. Respirations normal and unlabored. Afebrile. . Eyes Nonicteric. Reactive to light. Ears, Nose, Mouth, and Throat Lips, teeth, and gums WNL.Marland Kitchen Moist mucosa without lesions. Neck supple and nontender. No palpable supraclavicular or cervical adenopathy. Normal sized without goiter. Respiratory WNL. No retractions.. Cardiovascular Pedal Pulses WNL. No clubbing, cyanosis or edema. Lymphatic No adneopathy. No adenopathy. No adenopathy. Musculoskeletal Adexa without tenderness or enlargement.. Digits and nails w/o clubbing, cyanosis, infection, petechiae, ischemia, or inflammatory conditions.. Integumentary (Hair, Skin) No suspicious lesions. No crepitus or fluctuance. No peri-wound warmth or erythema. No masses.Marland Kitchen Psychiatric Judgement and insight Intact.. No evidence of depression, anxiety, or agitation.. Notes the right calcaneal area has good granulation tissue and no debridement was required today. On her left forearm she has a large lacerated wound with a skin flap and using a forceps I was able to unfurl it quite a bit of it and use the skin as a local dressing. I have not been able to apply any Steri-Strips over this but will apply some  Mepitel and then use some Sorbact with hydrogel and do a gauze, Kerlix and Coban dressing and leave it in place for a week. Electronic Signature(s) Signed: 02/28/2016 2:40:40 PM By: [name], FACS Entered By: [name] on 02/28/2016 14:40:39 [name] ([id number]) -------------------------------------------------------------------------------- Physician Orders Details Patient Name: [name], [name] 02/28/2016 2:15 Date of Service: PM Medical Record [id number] Number: Patient Account Number: [id number] Mar 05, 1936 (80 y.o. Treating RN: Ahmed Prima Date of Birth/Sex: Female) Other Clinician: Primary Care Physician: Halina Maidens Treating [name] Referring Physician: Halina Maidens Physician/Extender: Suella Grove in Treatment: 12 Verbal / Phone Orders: Yes Clinician: Carolyne Fiscal, Debi Read Back and Verified: Yes Diagnosis Coding Wound Cleansing Wound #5 Right,Medial Calcaneus o Clean wound with Normal Saline. - in clinic o Cleanse wound with mild soap and water Wound #7 Anterior Forearm o Clean wound with Normal Saline. - in clinic o Cleanse wound with mild soap and water Anesthetic Wound #5 Right,Medial Calcaneus o Topical Lidocaine 4% cream applied to wound bed prior to debridement Wound #7 Anterior Forearm o Topical Lidocaine 4% cream applied to wound bed prior to debridement Primary Wound Dressing Wound #5 Right,Medial Calcaneus o Prisma Ag Wound #7 Anterior Forearm o Other: - mepitel, hydrogel, sorbact Secondary Dressing Wound #5 Right,Medial Calcaneus o Dry Gauze o Boardered Foam Dressing Wound #7 Anterior Forearm o Gauze and Kerlix/Conform Dressing Change Frequency Wound #5 Right,Medial Calcaneus o Change dressing every other day. AAILA, GRISSO ([id number]) Wound #7 Anterior Forearm o Change dressing every week Follow-up Appointments Wound #7 Anterior Forearm o Return Appointment in 1  week. Off-Loading o Other: - float heels with pillow under calves when in bed; wear pressure relieving boots while in bed Additional Orders / Instructions Wound #5 Right,Medial Calcaneus o Increase protein intake. o Activity as tolerated - VItamin C, A, ZINC, MVI Wound #7 Anterior Forearm o Increase protein intake. o Activity as tolerated - VItamin C, A, ZINC, MVI Electronic Signature(s) Signed: 02/28/2016 4:10:09 PM By: [name], FACS Signed: 02/28/2016 4:53:07 PM By: Alric Quan Entered By: Alric Quan on 02/28/2016 14:34:24 [name] ([id number]) -------------------------------------------------------------------------------- Problem List Details Patient Name:  Malachy Chamber Y. 02/28/2016 2:15 Date of Service: PM Medical Record [id number] Number: Patient Account Number: [id number] [birth year] (79 y.o. Treating RN: Ahmed Prima Date of Birth/Sex: Female) Other Clinician: Primary Care Physician: Halina Maidens Treating [name] Referring Physician: Halina Maidens Physician/Extender: Suella Grove in Treatment: 12 Active Problems ICD-10 Encounter Code Description Active Date Diagnosis E11.621 Type 2 diabetes mellitus with foot ulcer 12/02/2015 Yes L89.613 Pressure ulcer of right heel, stage 3 12/02/2015 Yes Z79.01 Long term (current) use of anticoagulants 12/02/2015 Yes S41.112A Laceration without foreign body of left upper arm, initial 02/28/2016 Yes encounter S41.102A Unspecified open wound of left upper arm, initial 02/28/2016 Yes encounter Inactive Problems Resolved Problems ICD-10 Code Description Active Date Resolved Date L97.212 Non-pressure chronic ulcer of right calf with fat layer 12/02/2015 12/02/2015 exposed L97.211 Non-pressure chronic ulcer of right calf limited to 12/02/2015 12/02/2015 breakdown of skin [name], [name] ([id number]) Electronic Signature(s) Signed: 02/28/2016 2:36:41 PM By: [name], FACS Entered By:  [name] on 02/28/2016 14:36:40 [name] ([id number]) -------------------------------------------------------------------------------- Progress Note Details Patient Name: [name] 02/28/2016 2:15 Date of Service: PM Medical Record [id number] Number: Patient Account Number: [id number] [birth year] (80 y.o. Treating RN: Ahmed Prima Date of Birth/Sex: Female) Other Clinician: Primary Care Physician: Halina Maidens Treating [name] Referring Physician: Halina Maidens Physician/Extender: Suella Grove in Treatment: 12 Subjective Chief Complaint Information obtained from Patient Patient is at the clinic for treatment of an open pressure ulcer to her right calcaneum which has been there for about 4 weeks History of Present Illness (HPI) The following HPI elements were documented for the patient's wound: Location: right posterior heel, and both lower extremities Quality: Patient reports experiencing a sharp pain to affected area(s). Severity: Patient states wound (s) are getting better. Duration: Patient has had the wound for < 4 weeks prior to presenting for treatment Timing: Pain in wound is Intermittent (comes and goes Context: The wound appeared gradually over time while she was in hospital with a right hip fracture Modifying Factors: Consults to this date include:was seen by her PCP and put on some antibiotics a while ago Associated Signs and Symptoms: Patient reports having difficulty standing for long periods. 80 year old patient referred who was recently discharged in April of this year with a left heel ulcer now comes with a right heel ulcer and some ulcerations in both lower extremities which have all come along during her recent hospitalization which she had a right hip fracture. She has a past medical history of COPD, diabetes mellitus type 2 with chronic kidney disease, multi-infarct dementia, anxiety, urinary retention, paroxysmal atrial fibrillation.  She has also had moderate malnutrition, multi-infarct dementia, congestive heart failure, left displaced femoral neck fracture, systemic lupus erythematosus. She is also status total abdominal hysterectomy with bilateral salpingo-oophorectomy, EGDs, hip arthroplasty on the left, cataract surgery. She has been a from a smoker and quit in May 2013 and she smoked for about 40 years. recently admitted to [facility] between January 19 and 07/03/2015 12/09/2015 -- right foot x-ray -- IMPRESSION:Soft tissue ulceration of the heel. No underlying erosive bony abnormality. Diffuse degenerative change. No acute bony abnormality. 12/30/2015-- admitted to the hospital between July 12 and July 18 of 2017 and was treated for cerebral infarction, infected decubitus ulcer and aspiration pneumonia of the right lung. MRI was negative for stroke and was thought to be secondary to pneumonia. She was treated with clindamycin. Patient was discharged home on Keflex and Zyvox. [name], [name] ([id number]) Dr. Lucky Cowboy took her for  an aortogram and a right lower extremity runoff and found to 30% stenosis in the mid to distal SFA but no significant stenosis in the popliteal artery and the SFA. There was a two-vessel runoff through the anterior tibial artery and peroneal artery without significant stenosis. felt her perfusion was adequate for wound healing. 02/28/2016 -- days ago she had a lacerated wound on her left forearm which they have been treating locally and have asked me to take a look today. Objective Constitutional Pulse regular. Respirations normal and unlabored. Afebrile. Vitals Time Taken: 2:02 PM, Height: 64 in, Weight: 110 lbs, BMI: 18.9, Temperature: 97.6 F, Pulse: 53 bpm, Respiratory Rate: 16 breaths/min, Blood Pressure: 147/59 mmHg. General Notes: Made Dr. Con Memos aware of pts HR and diastolic BP. Eyes Nonicteric. Reactive to light. Ears, Nose, Mouth, and Throat Lips, teeth, and  gums WNL.Marland Kitchen Moist mucosa without lesions. Neck supple and nontender. No palpable supraclavicular or cervical adenopathy. Normal sized without goiter. Respiratory WNL. No retractions.. Cardiovascular Pedal Pulses WNL. No clubbing, cyanosis or edema. Lymphatic No adneopathy. No adenopathy. No adenopathy. Musculoskeletal Adexa without tenderness or enlargement.. Digits and nails w/o clubbing, cyanosis, infection, petechiae, ischemia, or inflammatory conditions.Marland Kitchen Psychiatric Judgement and insight Intact.. No evidence of depression, anxiety, or agitation.. General Notes: the right calcaneal area has good granulation tissue and no debridement was required [name], [name]. ([id number]) today. On her left forearm she has a large lacerated wound with a skin flap and using a forceps I was able to unfurl it quite a bit of it and use the skin as a local dressing. I have not been able to apply any Steri-Strips over this but will apply some Mepitel and then use some Sorbact with hydrogel and do a gauze, Kerlix and Coban dressing and leave it in place for a week. Integumentary (Hair, Skin) No suspicious lesions. No crepitus or fluctuance. No peri-wound warmth or erythema. No masses.. Wound #5 status is Open. Original cause of wound was Gradually Appeared. The wound is located on the Right,Medial Calcaneus. The wound measures 0.5cm length x 0.5cm width x 0.5cm depth; 0.196cm^2 area and 0.098cm^3 volume. The wound is limited to skin breakdown. There is no tunneling or undermining noted. There is a large amount of serosanguineous drainage noted. The wound margin is distinct with the outline attached to the wound base. There is large (67-100%) pink, pale granulation within the wound bed. There is a small (1-33%) amount of necrotic tissue within the wound bed including Adherent Slough. The periwound skin appearance exhibited: Moist. The periwound skin appearance did not exhibit: Callus, Crepitus,  Excoriation, Fluctuance, Friable, Induration, Localized Edema, Rash, Scarring, Dry/Scaly, Maceration, Atrophie Blanche, Cyanosis, Ecchymosis, Hemosiderin Staining, Mottled, Pallor, Rubor, Erythema. Periwound temperature was noted as No Abnormality. The periwound has tenderness on palpation. Wound #7 status is Open. Original cause of wound was Trauma. The wound is located on the Anterior Forearm. The wound measures 3.5cm length x 2.5cm width x 0.1cm depth; 6.872cm^2 area and 0.687cm^3 volume. There is no tunneling or undermining noted. There is a large amount of serosanguineous drainage noted. The wound margin is distinct with the outline attached to the wound base. There is large (67-100%) red granulation within the wound bed. There is a small (1-33%) amount of necrotic tissue within the wound bed including Eschar. The periwound skin appearance exhibited: Moist. Periwound temperature was noted as No Abnormality. The periwound has tenderness on palpation. Assessment Active Problems ICD-10 E11.621 - Type 2 diabetes mellitus with foot ulcer L89.613 - Pressure ulcer  of right heel, stage 3 Z79.01 - Long term (current) use of anticoagulants S41.112A - Laceration without foreign body of left upper arm, initial encounter S41.102A - Unspecified open wound of left upper arm, initial encounter Plan Wound Cleansing: [name], [name] ([id number]) Wound #5 Right,Medial Calcaneus: Clean wound with Normal Saline. - in clinic Cleanse wound with mild soap and water Wound #7 Anterior Forearm: Clean wound with Normal Saline. - in clinic Cleanse wound with mild soap and water Anesthetic: Wound #5 Right,Medial Calcaneus: Topical Lidocaine 4% cream applied to wound bed prior to debridement Wound #7 Anterior Forearm: Topical Lidocaine 4% cream applied to wound bed prior to debridement Primary Wound Dressing: Wound #5 Right,Medial Calcaneus: Prisma Ag Wound #7 Anterior Forearm: Other: - mepitel,  hydrogel, sorbact Secondary Dressing: Wound #5 Right,Medial Calcaneus: Dry Gauze Boardered Foam Dressing Wound #7 Anterior Forearm: Gauze and Kerlix/Conform Dressing Change Frequency: Wound #5 Right,Medial Calcaneus: Change dressing every other day. Wound #7 Anterior Forearm: Change dressing every week Follow-up Appointments: Wound #7 Anterior Forearm: Return Appointment in 1 week. Off-Loading: Other: - float heels with pillow under calves when in bed; wear pressure relieving boots while in bed Additional Orders / Instructions: Wound #5 Right,Medial Calcaneus: Increase protein intake. Activity as tolerated - VItamin C, A, ZINC, MVI Wound #7 Anterior Forearm: Increase protein intake. Activity as tolerated - VItamin C, A, ZINC, MVI I have recommended: 1. Prisma AG to the wounds on the right heel. This is to be changed every other day 2. The forearm will be dressed with Mepitel, Sorbact with hydrogel and a layered dressing to be left in place for a week. JESSENIA, MARKU ([id number]) 3. Good control of her diabetes 4. Adequate nutrition with protein supplements, vitamin A, vitamin C and zinc 5. Regular visits to the wound center Electronic Signature(s) Signed: 02/28/2016 4:11:05 PM By: [name], FACS Previous Signature: 02/28/2016 2:42:02 PM Version By: [name], FACS Entered By: [name] on 02/28/2016 16:11:05 [name] ([id number]) -------------------------------------------------------------------------------- SuperBill Details Patient Name: [name] Date of Service: 02/28/2016 Medical Record Number: [id number] Patient Account Number: [id number] Date of Birth/Sex: [birth year] (80 y.o. Female) Treating RN: Carolyne Fiscal, Debi Primary Care Physician: Halina Maidens Other Clinician: Referring Physician: Halina Maidens Treating Physician/Extender: Frann Rider in Treatment: 12 Diagnosis Coding ICD-10 Codes Code Description E11.621  Type 2 diabetes mellitus with foot ulcer L89.613 Pressure ulcer of right heel, stage 3 Z79.01 Long term (current) use of anticoagulants S41.112A Laceration without foreign body of left upper arm, initial encounter S41.102A Unspecified open wound of left upper arm, initial encounter Facility Procedures CPT4 Code: [id number] Description: 99214 - WOUND CARE VISIT-LEV 4 EST PT Modifier: Quantity: 1 Physician Procedures CPT4 Code Description: [id number] - WC PHYS LEVEL 3 - EST PT ICD-10 Description Diagnosis E11.621 Type 2 diabetes mellitus with foot ulcer L89.613 Pressure ulcer of right heel, stage 3 S41.112A Laceration without foreign body of left upper arm,  S41.102A Unspecified open wound of left upper arm, initial e Modifier: initial encount ncounter Quantity: 1 er Engineer, maintenance) Signed: 02/28/2016 4:53:07 PM By: Alric Quan Previous Signature: 02/28/2016 4:11:31 PM Version By: [name], FACS Previous Signature: 02/28/2016 2:42:20 PM Version By: [name], FACS Entered By: Alric Quan on 02/28/2016 16:28:02

## 2016-03-01 DIAGNOSIS — S72001D Fracture of unspecified part of neck of right femur, subsequent encounter for closed fracture with routine healing: Secondary | ICD-10-CM | POA: Diagnosis not present

## 2016-03-01 DIAGNOSIS — F0390 Unspecified dementia without behavioral disturbance: Secondary | ICD-10-CM | POA: Diagnosis not present

## 2016-03-01 DIAGNOSIS — E1142 Type 2 diabetes mellitus with diabetic polyneuropathy: Secondary | ICD-10-CM | POA: Diagnosis not present

## 2016-03-01 DIAGNOSIS — J449 Chronic obstructive pulmonary disease, unspecified: Secondary | ICD-10-CM | POA: Diagnosis not present

## 2016-03-01 DIAGNOSIS — L89614 Pressure ulcer of right heel, stage 4: Secondary | ICD-10-CM | POA: Diagnosis not present

## 2016-03-01 DIAGNOSIS — I5032 Chronic diastolic (congestive) heart failure: Secondary | ICD-10-CM | POA: Diagnosis not present

## 2016-03-03 DIAGNOSIS — I5032 Chronic diastolic (congestive) heart failure: Secondary | ICD-10-CM | POA: Diagnosis not present

## 2016-03-03 DIAGNOSIS — S72001D Fracture of unspecified part of neck of right femur, subsequent encounter for closed fracture with routine healing: Secondary | ICD-10-CM | POA: Diagnosis not present

## 2016-03-03 DIAGNOSIS — J449 Chronic obstructive pulmonary disease, unspecified: Secondary | ICD-10-CM | POA: Diagnosis not present

## 2016-03-03 DIAGNOSIS — F0390 Unspecified dementia without behavioral disturbance: Secondary | ICD-10-CM | POA: Diagnosis not present

## 2016-03-03 DIAGNOSIS — E1142 Type 2 diabetes mellitus with diabetic polyneuropathy: Secondary | ICD-10-CM | POA: Diagnosis not present

## 2016-03-03 DIAGNOSIS — L89614 Pressure ulcer of right heel, stage 4: Secondary | ICD-10-CM | POA: Diagnosis not present

## 2016-03-06 ENCOUNTER — Encounter: Payer: Medicare Other | Admitting: Surgery

## 2016-03-06 DIAGNOSIS — F0390 Unspecified dementia without behavioral disturbance: Secondary | ICD-10-CM | POA: Diagnosis not present

## 2016-03-06 DIAGNOSIS — L97422 Non-pressure chronic ulcer of left heel and midfoot with fat layer exposed: Secondary | ICD-10-CM | POA: Diagnosis not present

## 2016-03-06 DIAGNOSIS — J449 Chronic obstructive pulmonary disease, unspecified: Secondary | ICD-10-CM | POA: Diagnosis not present

## 2016-03-06 DIAGNOSIS — S41112A Laceration without foreign body of left upper arm, initial encounter: Secondary | ICD-10-CM | POA: Diagnosis not present

## 2016-03-06 DIAGNOSIS — E1142 Type 2 diabetes mellitus with diabetic polyneuropathy: Secondary | ICD-10-CM | POA: Diagnosis not present

## 2016-03-06 DIAGNOSIS — S72001D Fracture of unspecified part of neck of right femur, subsequent encounter for closed fracture with routine healing: Secondary | ICD-10-CM | POA: Diagnosis not present

## 2016-03-06 DIAGNOSIS — L89614 Pressure ulcer of right heel, stage 4: Secondary | ICD-10-CM | POA: Diagnosis not present

## 2016-03-06 DIAGNOSIS — Z7901 Long term (current) use of anticoagulants: Secondary | ICD-10-CM | POA: Diagnosis not present

## 2016-03-06 DIAGNOSIS — L89613 Pressure ulcer of right heel, stage 3: Secondary | ICD-10-CM | POA: Diagnosis not present

## 2016-03-06 DIAGNOSIS — E11621 Type 2 diabetes mellitus with foot ulcer: Secondary | ICD-10-CM | POA: Diagnosis not present

## 2016-03-06 DIAGNOSIS — I5032 Chronic diastolic (congestive) heart failure: Secondary | ICD-10-CM | POA: Diagnosis not present

## 2016-03-06 DIAGNOSIS — E1122 Type 2 diabetes mellitus with diabetic chronic kidney disease: Secondary | ICD-10-CM | POA: Diagnosis not present

## 2016-03-08 DIAGNOSIS — S72001D Fracture of unspecified part of neck of right femur, subsequent encounter for closed fracture with routine healing: Secondary | ICD-10-CM | POA: Diagnosis not present

## 2016-03-08 DIAGNOSIS — J449 Chronic obstructive pulmonary disease, unspecified: Secondary | ICD-10-CM | POA: Diagnosis not present

## 2016-03-08 DIAGNOSIS — I5032 Chronic diastolic (congestive) heart failure: Secondary | ICD-10-CM | POA: Diagnosis not present

## 2016-03-08 DIAGNOSIS — F0390 Unspecified dementia without behavioral disturbance: Secondary | ICD-10-CM | POA: Diagnosis not present

## 2016-03-08 DIAGNOSIS — L89614 Pressure ulcer of right heel, stage 4: Secondary | ICD-10-CM | POA: Diagnosis not present

## 2016-03-08 DIAGNOSIS — E1142 Type 2 diabetes mellitus with diabetic polyneuropathy: Secondary | ICD-10-CM | POA: Diagnosis not present

## 2016-03-08 NOTE — Progress Notes (Signed)
LANAUTICA, WINCHELL ([id number]) Visit Report for 03/06/2016 Arrival Information Details Patient Name: [name], [name]. Date of Service: 03/06/2016 2:15 PM Medical Record Number: [id number] Patient Account Number: [id number] Date of Birth/Sex: 08-08-1935 (80 y.o. Female) Treating RN: Carolyne Fiscal, Debi Primary Care Physician: Halina Maidens Other Clinician: Referring Physician: Halina Maidens Treating Physician/Extender: Frann Rider in Treatment: 23 Visit Information History Since Last Visit All ordered tests and consults were completed: No Patient Arrived: Wheel Chair Added or deleted any medications: No Arrival Time: 14:17 Any new allergies or adverse reactions: No Accompanied By: caregiver and husband Had a fall or experienced change in No activities of daily living that may affect Transfer Assistance: EasyPivot Patient risk of falls: Lift Signs or symptoms of abuse/neglect since last No Patient Identification Verified: Yes visito Secondary Verification Process Yes Hospitalized since last visit: No Completed: Pain Present Now: No Patient Requires Transmission- No Based Precautions: Patient Has Alerts: Yes Patient Alerts: Patient on Blood Thinner Electronic Signature(s) Signed: 03/07/2016 4:46:10 PM By: Alric Quan Entered By: Alric Quan on 03/06/2016 14:18:33 [name] ([id number]) -------------------------------------------------------------------------------- Encounter Discharge Information Details Patient Name: [name] Date of Service: 03/06/2016 2:15 PM Medical Record Number: [id number] Patient Account Number: [id number] Date of Birth/Sex: 09-07-1935 (80 y.o. Female) Treating RN: Ahmed Prima Primary Care Physician: Halina Maidens Other Clinician: Referring Physician: Halina Maidens Treating Physician/Extender: Frann Rider in Treatment: 50 Encounter Discharge Information Items Discharge Pain Level: 0 Discharge  Condition: Stable Ambulatory Status: Wheelchair Discharge Destination: Home Transportation: Private Auto husband and Accompanied By: caregiver Schedule Follow-up Appointment: Yes Medication Reconciliation completed and provided to Patient/Care Yes [name]: Provided on Clinical Summary of Care: 03/06/2016 Form Type Recipient Paper Patient LK Electronic Signature(s) Signed: 03/06/2016 2:58:27 PM By: Ruthine Dose Entered By: Ruthine Dose on 03/06/2016 14:58:27 [name] ([id number]) -------------------------------------------------------------------------------- Lower Extremity Assessment Details Patient Name: [name] Date of Service: 03/06/2016 2:15 PM Medical Record Number: [id number] Patient Account Number: [id number] Date of Birth/Sex: 01-05-1936 (80 y.o. Female) Treating RN: Carolyne Fiscal, Debi Primary Care Physician: Halina Maidens Other Clinician: Referring Physician: Halina Maidens Treating Physician/Extender: Frann Rider in Treatment: 13 Vascular Assessment Pulses: Posterior Tibial Dorsalis Pedis Palpable: [Right:Yes] Extremity colors, hair growth, and conditions: Extremity Color: [Right:Normal] Temperature of Extremity: [Right:Warm] Capillary Refill: [Right:< 3 seconds] Toe Nail Assessment Left: Right: Thick: No Discolored: No Deformed: No Improper Length and Hygiene: No Electronic Signature(s) Signed: 03/07/2016 4:46:10 PM By: Alric Quan Entered By: Alric Quan on 03/06/2016 14:19:32 [name] ([id number]) -------------------------------------------------------------------------------- Multi Wound Chart Details Patient Name: [name] Date of Service: 03/06/2016 2:15 PM Medical Record Number: [id number] Patient Account Number: [id number] Date of Birth/Sex: 1935-09-20 (80 y.o. 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] [7:Trauma, Other] [N/A:N/A] Comorbid History: [5:Chronic Obstructive Pulmonary Disease (COPD), Angina, Congestive Heart Failure, Type II Diabetes, Lupus Erythematosus, Osteoarthritis, Dementia] [7:Chronic Obstructive Pulmonary Disease (COPD), Angina, Congestive Heart Failure, Type  II Diabetes, Lupus Erythematosus, Osteoarthritis, Dementia] [N/A:N/A] Date Acquired: [5:11/03/2015] [7:02/24/2016] [N/A:N/A] Weeks of Treatment: [5:13] [7:1] [N/A:N/A] Wound Status: [5:Open] [7:Open] [N/A:N/A] Measurements L x W x D 0.1x0.1x0.1 [7:2x2x0.1] [N/A:N/A] (cm)  Area (cm) : [5:0.008] [7:3.142] [N/A:N/A] Volume (cm) : [5:0.001] [7:0.314] [N/A:N/A] % Reduction in Area: [5:99.90%] [7:54.30%] [N/A:N/A] % Reduction in Volume: 99.90% [7:54.30%] [N/A:N/A] Classification: [5:Grade 1] [7:Partial Thickness] [N/A:N/A] Exudate Amount: [5:Small] [7:Large] [N/A:N/A] Exudate Type: [5:Serosanguineous] [7:Serosanguineous] [N/A:N/A] Exudate Color: [5:red, brown] [7:red, brown] [N/A:N/A] Foul Odor After [5:Yes] [7:No] [N/A:N/A] Cleansing: Odor Anticipated Due to No [7:N/A] [N/A:N/A] Product Use: Wound Margin: [5:Distinct, outline attached] [7:Distinct, outline attached N/A] Granulation Amount: Large (67-100%) Large (67-100%) N/A Granulation Quality: Pink, Pale Red N/A Necrotic Amount: Small (1-33%) Small (1-33%) N/A Necrotic Tissue: Adherent Slough Eschar N/A Exposed Structures: Fascia: No N/A N/A Fat: No Tendon:  No Muscle: No Joint: No Bone: No Limited to Skin Breakdown Epithelialization: Medium (34-66%) None N/A Periwound Skin Texture: Edema: No No Abnormalities Noted N/A Excoriation: No Induration: No Callus: No Crepitus: No Fluctuance: No Friable: No Rash: No Scarring: No Periwound Skin Moist: Yes Moist: Yes N/A Moisture: Maceration: No Dry/Scaly: No Periwound Skin Color: Atrophie Blanche: No No Abnormalities Noted N/A Cyanosis: No Ecchymosis: No Erythema: No Hemosiderin Staining: No Mottled: No Pallor: No Rubor: No Temperature: No Abnormality No Abnormality N/A Tenderness on Yes Yes N/A Palpation: Wound Preparation: Ulcer Cleansing: Ulcer Cleansing: N/A Rinsed/Irrigated with Rinsed/Irrigated with Saline Saline Topical Anesthetic Topical Anesthetic Applied: Other: lidocaine Applied: Other: lidocaine 4% 4% Treatment Notes Electronic Signature(s) Signed: 03/07/2016 4:46:10 PM By: Alric Quan Entered By: Alric Quan on 03/06/2016 14:30:18 [name] ([id number]) [name] ([id number]) -------------------------------------------------------------------------------- [place] Details Patient Name: [name], [name]. Date of Service: 03/06/2016 2:15 PM Medical Record Number: [id number] Patient Account Number: [id number] Date of Birth/Sex: February 13, 1936 (80 y.o. Female) Treating RN: Carolyne Fiscal, Debi Primary Care Physician: Halina Maidens Other Clinician: Referring Physician: Halina Maidens Treating Physician/Extender: Frann Rider in Treatment: 33 Active Inactive Abuse / Safety / Falls / Self Care Management Nursing Diagnoses: Potential for falls Goals: Patient/caregiver will verbalize understanding of skin care regimen Date Initiated: 12/02/2015 Goal Status: Active Patient/caregiver will verbalize/demonstrate measures taken to prevent injury and/or falls Date Initiated: 12/02/2015 Goal Status:  Active Interventions: Assess fall risk on admission and as needed Assess: immobility, friction, shearing, incontinence upon admission and as needed Assess impairment of mobility on admission and as needed per policy Assess self care needs on admission and as needed Provide education on fall prevention Provide education on safe transfers Notes: Wound/Skin Impairment Nursing Diagnoses: Impaired tissue integrity Goals: Patient/caregiver will verbalize understanding of skin care regimen Date Initiated: 12/02/2015 Goal Status: Active Interventions: Assess patient/caregiver ability to obtain necessary supplies Assess patient/caregiver ability to perform ulcer/skin care regimen upon admission and as needed [name], [name] ([id number]) Assess ulceration(s) every visit Provide education on ulcer and skin care Notes: Electronic Signature(s) Signed: 03/07/2016 4:46:10 PM By: Alric Quan Entered By: Alric Quan on 03/06/2016 14:29:51 [name] ([id number]) -------------------------------------------------------------------------------- Pain Assessment Details Patient Name: [name] Date of Service: 03/06/2016 2:15 PM Medical Record Number: [id number] Patient Account Number: [id number] Date of Birth/Sex: June 05, 1936 (80 y.o. Female) Treating RN: Ahmed Prima Primary Care Physician: Halina Maidens Other Clinician: Referring Physician: Halina Maidens Treating Physician/Extender: Frann Rider in Treatment: 13 Active Problems Location of Pain Severity and Description of Pain Patient Has Paino No Site Locations With Dressing Change: No Pain Management and Medication Current Pain Management: Electronic Signature(s) Signed: 03/07/2016 4:46:10 PM By: Alric Quan Entered By: Alric Quan on 03/06/2016 14:19:10 [name] ([id number]) -------------------------------------------------------------------------------- Patient/Caregiver Education  Details Patient Name: [name]. Date of Service: 03/06/2016 2:15 PM Medical Record  Number: [name] Patient Account Number: [id number] Date of Birth/Gender: [birth year] (79 y.o. Female) Treating RN: Ahmed Prima Primary Care Physician: Halina Maidens Other Clinician: Referring Physician: Halina Maidens Treating Physician/Extender: Frann Rider in Treatment: 13 Education Assessment Education Provided To: Patient Education Topics Provided Wound/Skin Impairment: Handouts: Other: change dressing as ordered Methods: Demonstration, Explain/Verbal Responses: State content correctly Electronic Signature(s) Signed: 03/07/2016 4:46:10 PM By: Alric Quan Entered By: Alric Quan on 03/06/2016 14:39:13 [name] ([name]) -------------------------------------------------------------------------------- Wound Assessment Details Patient Name: [name] Date of Service: 03/06/2016 2:15 PM Medical Record Number: [name] Patient Account Number: [id number] Date of Birth/Sex: 11-Sep-1935 (80 y.o. Female) Treating RN: Carolyne Fiscal, Debi Primary Care Physician: Halina Maidens Other Clinician: Referring Physician: Halina Maidens Treating Physician/Extender: Frann Rider in Treatment: 13 Wound Status Wound Number: 5 Primary Diabetic Wound/Ulcer of the Lower Etiology: Extremity Wound Location: Right, Medial Calcaneus Wound Open Wounding Event: Gradually Appeared Status: Date Acquired: 11/03/2015 Comorbid Chronic Obstructive Pulmonary Disease Weeks Of Treatment: 13 History: (COPD), Angina, Congestive Heart Clustered Wound: No Failure, Type II Diabetes, Lupus Erythematosus, Osteoarthritis, Dementia Photos Photo Uploaded By: Alric Quan on 03/06/2016 15:08:00 Wound Measurements Length: (cm) 0.3 Width: (cm) 0.3 Depth: (cm) 0.3 Area: (cm) 0.071 Volume: (cm) 0.021 % Reduction in Area: 99.3% % Reduction in Volume:  97.8% Epithelialization: Large (67-100%) Tunneling: No Undermining: No Wound Description Classification: Grade 1 Foul Odor Aft Wound Margin: Distinct, outline attached Due to Produc Exudate Amount: Small Exudate Type: Serous Exudate Color: amber er Cleansing: Yes t Use: No Wound Bed Granulation Amount: Large (67-100%) Exposed Structure Granulation Quality: Pink, Pale Fascia Exposed: No Necrotic Amount: Small (1-33%) Fat Layer Exposed: No [name], [name] ([name]) Necrotic Quality: Adherent Slough Tendon Exposed: No Muscle Exposed: No Joint Exposed: No Bone Exposed: No Limited to Skin Breakdown Periwound Skin Texture Texture Color No Abnormalities Noted: No No Abnormalities Noted: No Callus: No Atrophie Blanche: No Crepitus: No Cyanosis: No Excoriation: No Ecchymosis: No Fluctuance: No Erythema: No Friable: No Hemosiderin Staining: No Induration: No Mottled: No Localized Edema: No Pallor: No Rash: No Rubor: No Scarring: No Temperature / Pain Moisture Temperature: No Abnormality No Abnormalities Noted: No Tenderness on Palpation: Yes Dry / Scaly: No Maceration: No Moist: Yes Wound Preparation Ulcer Cleansing: Rinsed/Irrigated with Saline Topical Anesthetic Applied: Other: lidocaine 4%, Treatment Notes Wound #5 (Right, Medial Calcaneus) 1. Cleansed with: Clean wound with Normal Saline 2. Anesthetic Topical Lidocaine 4% cream to wound bed prior to debridement 3. Peri-wound Care: Skin Prep 4. Dressing Applied: Prisma Ag 5. Secondary Dressing Applied Bordered Foam Dressing Dry Gauze Notes stretch net Electronic Signature(s) Signed: 03/07/2016 4:46:10 PM By: Severiano Gilbert ([name]) Entered By: Alric Quan on 03/06/2016 14:34:38 [name] ([name]) -------------------------------------------------------------------------------- Wound Assessment Details Patient Name: [name] Date of Service:  03/06/2016 2:15 PM Medical Record Number: [name] Patient Account Number: [id number] Date of Birth/Sex: Jan 09, 1936 (80 y.o. Female) Treating RN: Carolyne Fiscal, Debi Primary Care Physician: Halina Maidens Other Clinician: Referring Physician: Halina Maidens Treating Physician/Extender: Frann Rider in Treatment: 13 Wound Status Wound Number: 7 Primary Trauma, Other Etiology: Wound Location: Forearm - Anterior Wound Open Wounding Event: Trauma Status: Date Acquired: 02/24/2016 Comorbid Chronic Obstructive Pulmonary Disease Weeks Of Treatment: 1 History: (COPD), Angina, Congestive Heart Clustered Wound: No Failure, Type II Diabetes, Lupus Erythematosus, Osteoarthritis, Dementia Photos Photo Uploaded By: Alric Quan on 03/06/2016 15:08:01 Wound Measurements Length: (cm) 2 Width: (cm) 2 Depth: (cm) 0.1 Area: (cm) 3.142 Volume: (cm) 0.314 % Reduction in Area: 54.3% %  Reduction in Volume: 54.3% Epithelialization: None Tunneling: No Undermining: No Wound Description Classification: Partial Thickness Wound Margin: Distinct, outline attached Exudate Amount: Large Exudate Type: Serosanguineous Exudate Color: red, brown Foul Odor After Cleansing: No Wound Bed Granulation Amount: Large (67-100%) Granulation Quality: Red Necrotic Amount: Small (1-33%) [name], [name] ([id number]) Necrotic Quality: Eschar Periwound Skin Texture Texture Color No Abnormalities Noted: No No Abnormalities Noted: No Moisture Temperature / Pain No Abnormalities Noted: No Temperature: No Abnormality Moist: Yes Tenderness on Palpation: Yes Wound Preparation Ulcer Cleansing: Rinsed/Irrigated with Saline Topical Anesthetic Applied: Other: lidocaine 4%, Treatment Notes Wound #7 (Anterior Forearm) 1. Cleansed with: Clean wound with Normal Saline 2. Anesthetic Topical Lidocaine 4% cream to wound bed prior to debridement 3. Peri-wound Care: Skin Prep 4. Dressing  Applied: Mepitel Other dressing (specify in notes) 5. Secondary Dressing Applied Gauze and Kerlix/Conform Notes hydrogel, sorbact Electronic Signature(s) Signed: 03/07/2016 4:46:10 PM By: Alric Quan Entered By: Alric Quan on 03/06/2016 14:26:04 [name] ([id number]) -------------------------------------------------------------------------------- [place] Details Patient Name: [name] Date of Service: 03/06/2016 2:15 PM Medical Record Number: [id number] Patient Account Number: [id number] Date of Birth/Sex: 03-25-1936 (80 y.o. Female) Treating RN: Carolyne Fiscal, Debi Primary Care Physician: Halina Maidens Other Clinician: Referring Physician: Halina Maidens Treating Physician/Extender: Frann Rider in Treatment: 13 Vital Signs Time Taken: 14:29 Temperature (F): 97.6 Height (in): 64 Pulse (bpm): 55 Weight (lbs): 110 Respiratory Rate (breaths/min): 16 Body Mass Index (BMI): 18.9 Blood Pressure (mmHg): 155/65 Reference Range: 80 - 120 mg / dl Notes Dr. Con Memos made aware of pts HR. Electronic Signature(s) Signed: 03/07/2016 4:46:10 PM By: Alric Quan Entered By: Alric Quan on 03/06/2016 14:32:27

## 2016-03-08 NOTE — Progress Notes (Signed)
[name] ([id number]) Visit Report for 03/06/2016 Chief Complaint Document Details Patient Name: [name], [name] 03/06/2016 2:15 Date of Service: PM Medical Record [id number] Number: Patient Account Number: [id number] 1936/02/14 (79 y.o. Treating RN: Ahmed Prima Date of Birth/Sex: Female) Other Clinician: Primary Care Physician: Halina Maidens Treating [name] Referring Physician: Halina Maidens Physician/Extender: Suella Grove in Treatment: 13 Information Obtained from: Patient Chief Complaint Patient is at the clinic for treatment of an open pressure ulcer to her right calcaneum which has been there for about 4 weeks Electronic Signature(s) Signed: 03/06/2016 3:01:33 PM By: [name], FACS Entered By: [name] on 03/06/2016 15:01:33 [name] ([id number]) -------------------------------------------------------------------------------- Debridement Details Patient Name: [name] 03/06/2016 2:15 Date of Service: PM Medical Record [id number] Number: Patient Account Number: [id number] November 23, 1935 (80 y.o. Treating RN: Ahmed Prima Date of Birth/Sex: Female) Other Clinician: Primary Care Physician: Halina Maidens Treating [name] Referring Physician: Halina Maidens Physician/Extender: Suella Grove in Treatment: 13 Debridement Performed for Wound #5 Right,Medial Calcaneus Assessment: Performed By: Physician [name], MD Debridement: Debridement Pre-procedure Yes - 14:32 Verification/Time Out Taken: Start Time: 14:32 Pain Control: Lidocaine 4% Topical Solution Level: Skin/Subcutaneous Tissue Total Area Debrided (L x 0.3 (cm) x 0.3 (cm) = 0.09 (cm) W): Tissue and other Viable, Non-Viable, Exudate, Fibrin/Slough, Subcutaneous material debrided: Instrument: Curette Bleeding: Minimum Hemostasis Achieved: Pressure End Time: 14:36 Procedural Pain: 0 Post Procedural Pain: 0 Response to Treatment: Procedure was tolerated  well Post Debridement Measurements of Total Wound Length: (cm) 0.3 Width: (cm) 0.3 Depth: (cm) 0.3 Volume: (cm) 0.021 Character of Wound/Ulcer Post Stable Debridement: Severity of Tissue Post Debridement: Fat layer exposed Post Procedure Diagnosis Same as Pre-procedure Electronic Signature(s) Signed: 03/06/2016 3:01:21 PM By: [name], FACS Signed: 03/07/2016 4:46:10 PM By: Severiano Gilbert ([id number]) Entered By: [name] on 03/06/2016 15:01:21 [name] ([id number]) -------------------------------------------------------------------------------- HPI Details Patient Name: [name] 03/06/2016 2:15 Date of Service: PM Medical Record [id number] Number: Patient Account Number: [id number] [birth year] (80 y.o. Treating RN: Ahmed Prima Date of Birth/Sex: Female) Other Clinician: Primary Care Physician: Halina Maidens Treating [name] Referring Physician: Halina Maidens Physician/Extender: Suella Grove in Treatment: 13 History of Present Illness Location: right posterior heel, and both lower extremities Quality: Patient reports experiencing a sharp pain to affected area(s). Severity: Patient states wound (s) are getting better. Duration: Patient has had the wound for < 4 weeks prior to presenting for treatment Timing: Pain in wound is Intermittent (comes and goes Context: The wound appeared gradually over time while she was in hospital with a right hip fracture Modifying Factors: Consults to this date include:was seen by her PCP and put on some antibiotics a while ago Associated Signs and Symptoms: Patient reports having difficulty standing for long periods. HPI Description: 80 year old patient referred who was recently discharged in April of this year with a left heel ulcer now comes with a right heel ulcer and some ulcerations in both lower extremities which have all come along during her recent hospitalization which she had a  right hip fracture. She has a past medical history of COPD, diabetes mellitus type 2 with chronic kidney disease, multi-infarct dementia, anxiety, urinary retention, paroxysmal atrial fibrillation. She has also had moderate malnutrition, multi-infarct dementia, congestive heart failure, left displaced femoral neck fracture, systemic lupus erythematosus. She is also status total abdominal hysterectomy with bilateral salpingo-oophorectomy, EGDs, hip arthroplasty on the left, cataract surgery. She has been a from a smoker and quit in May 2013 and she smoked  for about 40 years. recently admitted to [facility] between January 19 and 07/03/2015 12/09/2015 -- o right foot x-ray -- IMPRESSION:Soft tissue ulceration of the heel. No underlying erosive bony abnormality. Diffuse degenerative change. No acute bony abnormality. 12/30/2015-- admitted to the hospital between July 12 and July 18 of 2017 and was treated for cerebral infarction, infected decubitus ulcer and aspiration pneumonia of the right lung. MRI was negative for stroke and was thought to be secondary to pneumonia. She was treated with clindamycin. Patient was discharged home on Keflex and Zyvox. Dr. Lucky Cowboy took her for an aortogram and a right lower extremity runoff and found to 30% stenosis in the mid to distal SFA but no significant stenosis in the popliteal artery and the SFA. There was a two-vessel runoff through the anterior tibial artery and peroneal artery without significant stenosis. felt her perfusion was adequate for wound healing. 02/28/2016 -- days ago she had a lacerated wound on her left forearm which they have been treating locally and have asked me to take a look today. [name], [name] ([id number]) Electronic Signature(s) Signed: 03/06/2016 3:02:00 PM By: [name], FACS Entered By: [name] on 03/06/2016 15:02:00 [name]  ([id number]) -------------------------------------------------------------------------------- Physical Exam Details Patient Name: [name], [name] 03/06/2016 2:15 Date of Service: PM Medical Record [id number] Number: Patient Account Number: [id number] [birth year] (80 y.o. Treating RN: Ahmed Prima Date of Birth/Sex: Female) Other Clinician: Primary Care Physician: Halina Maidens Treating [name] Referring Physician: Halina Maidens Physician/Extender: Suella Grove in Treatment: 13 Constitutional . Pulse regular. Respirations normal and unlabored. Afebrile. . Eyes Nonicteric. Reactive to light. Ears, Nose, Mouth, and Throat Lips, teeth, and gums WNL.Marland Kitchen Moist mucosa without lesions. Neck supple and nontender. No palpable supraclavicular or cervical adenopathy. Normal sized without goiter. Respiratory WNL. No retractions.. Breath sounds WNL, No rubs, rales, rhonchi, or wheeze.. Cardiovascular Heart rhythm and rate regular, no murmur or gallop.. Pedal Pulses WNL. No clubbing, cyanosis or edema. Lymphatic No adneopathy. No adenopathy. No adenopathy. Musculoskeletal Adexa without tenderness or enlargement.. Digits and nails w/o clubbing, cyanosis, infection, petechiae, ischemia, or inflammatory conditions.. Integumentary (Hair, Skin) No suspicious lesions. No crepitus or fluctuance. No peri-wound warmth or erythema. No masses.Marland Kitchen Psychiatric Judgement and insight Intact.. No evidence of depression, anxiety, or agitation.. Notes the left forearm looks very good and the lacerated area has I's nicely and we will continue with the Mepitel and then use some Sorbact with hydrogel and do a gauze, Kerlix and Coban dressing and leave it in place for a week. the right calcaneal has a lot of debris and after sharply removed and #3 curet there are couple areas with open subcutaneous fat which has healthy granulation tissue. Electronic Signature(s) Signed: 03/06/2016 3:03:19 PM By: [name], FACS Entered By: [name] on 03/06/2016 15:03:18 [name] ([id number]) -------------------------------------------------------------------------------- Physician Orders Details Patient Name: [name], [name] 03/06/2016 2:15 Date of Service: PM Medical Record [id number] Number: Patient Account Number: [id number] [birth year] (80 y.o. Treating RN: Ahmed Prima Date of Birth/Sex: Female) Other Clinician: Primary Care Physician: Halina Maidens Treating [name] Referring Physician: Halina Maidens Physician/Extender: Suella Grove in Treatment: 13 Verbal / Phone Orders: Yes Clinician: Carolyne Fiscal, Debi Read Back and Verified: Yes Diagnosis Coding Wound Cleansing Wound #5 Right,Medial Calcaneus o Clean wound with Normal Saline. - in clinic o Cleanse wound with mild soap and water Wound #7 Anterior Forearm o Clean wound with Normal Saline. - in clinic o Cleanse wound with mild soap and water Anesthetic Wound #  5 Right,Medial Calcaneus o Topical Lidocaine 4% cream applied to wound bed prior to debridement Wound #7 Anterior Forearm o Topical Lidocaine 4% cream applied to wound bed prior to debridement Primary Wound Dressing Wound #5 Right,Medial Calcaneus o Prisma Ag Wound #7 Anterior Forearm o Other: - mepitel, hydrogel, sorbact Secondary Dressing Wound #5 Right,Medial Calcaneus o Dry Gauze o Boardered Foam Dressing Wound #7 Anterior Forearm o Gauze and Kerlix/Conform Dressing Change Frequency Wound #5 Right,Medial Calcaneus o Change dressing every other day. SEANTE, FARD ([id number]) Wound #7 Anterior Forearm o Change dressing every week Follow-up Appointments Wound #7 Anterior Forearm o Return Appointment in 1 week. Off-Loading o Other: - float heels with pillow under calves when in bed; wear pressure relieving boots while in bed Additional Orders / Instructions Wound #5 Right,Medial Calcaneus o Increase  protein intake. o Activity as tolerated - VItamin C, A, ZINC, MVI Wound #7 Anterior Forearm o Increase protein intake. o Activity as tolerated - VItamin C, A, ZINC, MVI Electronic Signature(s) Signed: 03/06/2016 3:04:46 PM By: [name], FACS Signed: 03/07/2016 4:46:10 PM By: Alric Quan Entered By: Alric Quan on 03/06/2016 14:37:15 [name] ([id number]) -------------------------------------------------------------------------------- Problem List Details Patient Name: JAKELIN, MUDRY 03/06/2016 2:15 Date of Service: PM Medical Record [id number] Number: Patient Account Number: [id number] 10-13-1935 (80 y.o. Treating RN: Ahmed Prima Date of Birth/Sex: Female) Other Clinician: Primary Care Physician: Halina Maidens Treating [name] Referring Physician: Halina Maidens Physician/Extender: Suella Grove in Treatment: 13 Active Problems ICD-10 Encounter Code Description Active Date Diagnosis E11.621 Type 2 diabetes mellitus with foot ulcer 12/02/2015 Yes L89.613 Pressure ulcer of right heel, stage 3 12/02/2015 Yes Z79.01 Long term (current) use of anticoagulants 12/02/2015 Yes S41.112A Laceration without foreign body of left upper arm, initial 02/28/2016 Yes encounter S41.102A Unspecified open wound of left upper arm, initial 02/28/2016 Yes encounter Inactive Problems Resolved Problems ICD-10 Code Description Active Date Resolved Date L97.212 Non-pressure chronic ulcer of right calf with fat layer 12/02/2015 12/02/2015 exposed L97.211 Non-pressure chronic ulcer of right calf limited to 12/02/2015 12/02/2015 breakdown of skin [name], [name] ([id number]) Electronic Signature(s) Signed: 03/06/2016 3:01:08 PM By: [name], FACS Entered By: [name] on 03/06/2016 15:01:08 [name] ([id number]) -------------------------------------------------------------------------------- Progress Note Details Patient Name: [name]  03/06/2016 2:15 Date of Service: PM Medical Record [id number] Number: Patient Account Number: [id number] [birth year] (80 y.o. Treating RN: Ahmed Prima Date of Birth/Sex: Female) Other Clinician: Primary Care Physician: Halina Maidens Treating [name] Referring Physician: Halina Maidens Physician/Extender: Suella Grove in Treatment: 13 Subjective Chief Complaint Information obtained from Patient Patient is at the clinic for treatment of an open pressure ulcer to her right calcaneum which has been there for about 4 weeks History of Present Illness (HPI) The following HPI elements were documented for the patient's wound: Location: right posterior heel, and both lower extremities Quality: Patient reports experiencing a sharp pain to affected area(s). Severity: Patient states wound (s) are getting better. Duration: Patient has had the wound for < 4 weeks prior to presenting for treatment Timing: Pain in wound is Intermittent (comes and goes Context: The wound appeared gradually over time while she was in hospital with a right hip fracture Modifying Factors: Consults to this date include:was seen by her PCP and put on some antibiotics a while ago Associated Signs and Symptoms: Patient reports having difficulty standing for long periods. 80 year old patient referred who was recently discharged in April of this year with a left heel ulcer now comes with a right  heel ulcer and some ulcerations in both lower extremities which have all come along during her recent hospitalization which she had a right hip fracture. She has a past medical history of COPD, diabetes mellitus type 2 with chronic kidney disease, multi-infarct dementia, anxiety, urinary retention, paroxysmal atrial fibrillation. She has also had moderate malnutrition, multi-infarct dementia, congestive heart failure, left displaced femoral neck fracture, systemic lupus erythematosus. She is also status total abdominal  hysterectomy with bilateral salpingo-oophorectomy, EGDs, hip arthroplasty on the left, cataract surgery. She has been a from a smoker and quit in May 2013 and she smoked for about 40 years. recently admitted to [facility] between January 19 and 07/03/2015 12/09/2015 -- right foot x-ray -- IMPRESSION:Soft tissue ulceration of the heel. No underlying erosive bony abnormality. Diffuse degenerative change. No acute bony abnormality. 12/30/2015-- admitted to the hospital between July 12 and July 18 of 2017 and was treated for cerebral infarction, infected decubitus ulcer and aspiration pneumonia of the right lung. MRI was negative for stroke and was thought to be secondary to pneumonia. She was treated with clindamycin. Patient was discharged home on Keflex and Zyvox. [name] ([id number]) Dr. Lucky Cowboy took her for an aortogram and a right lower extremity runoff and found to 30% stenosis in the mid to distal SFA but no significant stenosis in the popliteal artery and the SFA. There was a two-vessel runoff through the anterior tibial artery and peroneal artery without significant stenosis. felt her perfusion was adequate for wound healing. 02/28/2016 -- days ago she had a lacerated wound on her left forearm which they have been treating locally and have asked me to take a look today. Objective Constitutional Pulse regular. Respirations normal and unlabored. Afebrile. Vitals Time Taken: 2:29 PM, Height: 64 in, Weight: 110 lbs, BMI: 18.9, Temperature: 97.6 F, Pulse: 55 bpm, Respiratory Rate: 16 breaths/min, Blood Pressure: 155/65 mmHg. General Notes: Dr. Con Memos made aware of pts HR. Eyes Nonicteric. Reactive to light. Ears, Nose, Mouth, and Throat Lips, teeth, and gums WNL.Marland Kitchen Moist mucosa without lesions. Neck supple and nontender. No palpable supraclavicular or cervical adenopathy. Normal sized without goiter. Respiratory WNL. No retractions.. Breath sounds WNL, No  rubs, rales, rhonchi, or wheeze.. Cardiovascular Heart rhythm and rate regular, no murmur or gallop.. Pedal Pulses WNL. No clubbing, cyanosis or edema. Lymphatic No adneopathy. No adenopathy. No adenopathy. Musculoskeletal Adexa without tenderness or enlargement.. Digits and nails w/o clubbing, cyanosis, infection, petechiae, ischemia, or inflammatory conditions.Marland Kitchen Psychiatric Judgement and insight Intact.. No evidence of depression, anxiety, or agitation.. General Notes: the left forearm looks very good and the lacerated area has I's nicely and we will continue [name], [name]. ([id number]) with the Mepitel and then use some Sorbact with hydrogel and do a gauze, Kerlix and Coban dressing and leave it in place for a week. the right calcaneal has a lot of debris and after sharply removed and #3 curet there are couple areas with open subcutaneous fat which has healthy granulation tissue. Integumentary (Hair, Skin) No suspicious lesions. No crepitus or fluctuance. No peri-wound warmth or erythema. No masses.. Wound #5 status is Open. Original cause of wound was Gradually Appeared. The wound is located on the Right,Medial Calcaneus. The wound measures 0.3cm length x 0.3cm width x 0.3cm depth; 0.071cm^2 area and 0.021cm^3 volume. The wound is limited to skin breakdown. There is no tunneling or undermining noted. There is a small amount of serous drainage noted. The wound margin is distinct with the outline attached to the wound  base. There is large (67-100%) pink, pale granulation within the wound bed. There is a small (1-33%) amount of necrotic tissue within the wound bed including Adherent Slough. The periwound skin appearance exhibited: Moist. The periwound skin appearance did not exhibit: Callus, Crepitus, Excoriation, Fluctuance, Friable, Induration, Localized Edema, Rash, Scarring, Dry/Scaly, Maceration, Atrophie Blanche, Cyanosis, Ecchymosis, Hemosiderin Staining, Mottled, Pallor, Rubor,  Erythema. Periwound temperature was noted as No Abnormality. The periwound has tenderness on palpation. Wound #7 status is Open. Original cause of wound was Trauma. The wound is located on the Anterior Forearm. The wound measures 2cm length x 2cm width x 0.1cm depth; 3.142cm^2 area and 0.314cm^3 volume. There is no tunneling or undermining noted. There is a large amount of serosanguineous drainage noted. The wound margin is distinct with the outline attached to the wound base. There is large (67-100%) red granulation within the wound bed. There is a small (1-33%) amount of necrotic tissue within the wound bed including Eschar. The periwound skin appearance exhibited: Moist. Periwound temperature was noted as No Abnormality. The periwound has tenderness on palpation. Assessment Active Problems ICD-10 E11.621 - Type 2 diabetes mellitus with foot ulcer L89.613 - Pressure ulcer of right heel, stage 3 Z79.01 - Long term (current) use of anticoagulants S41.112A - Laceration without foreign body of left upper arm, initial encounter S41.102A - Unspecified open wound of left upper arm, initial encounter Procedures Wound #5 Wound #5 is a Diabetic Wound/Ulcer of the Lower Extremity located on the Right,Medial Calcaneus . There was a Skin/Subcutaneous Tissue Debridement [id number]) debridement with total area of 0.09 sq cm [name], [name] ([id number]) performed by [name], MD. with the following instrument(s): Curette to remove Viable and Non-Viable tissue/material including Exudate, Fibrin/Slough, and Subcutaneous after achieving pain control using Lidocaine 4% Topical Solution. A time out was conducted at 14:32, prior to the start of the procedure. A Minimum amount of bleeding was controlled with Pressure. The procedure was tolerated well with a pain level of 0 throughout and a pain level of 0 following the procedure. Post Debridement Measurements: 0.3cm length x 0.3cm width x 0.3cm depth;  0.021cm^3 volume. Character of Wound/Ulcer Post Debridement is stable. Severity of Tissue Post Debridement is: Fat layer exposed. Post procedure Diagnosis Wound #5: Same as Pre-Procedure Plan Wound Cleansing: Wound #5 Right,Medial Calcaneus: Clean wound with Normal Saline. - in clinic Cleanse wound with mild soap and water Wound #7 Anterior Forearm: Clean wound with Normal Saline. - in clinic Cleanse wound with mild soap and water Anesthetic: Wound #5 Right,Medial Calcaneus: Topical Lidocaine 4% cream applied to wound bed prior to debridement Wound #7 Anterior Forearm: Topical Lidocaine 4% cream applied to wound bed prior to debridement Primary Wound Dressing: Wound #5 Right,Medial Calcaneus: Prisma Ag Wound #7 Anterior Forearm: Other: - mepitel, hydrogel, sorbact Secondary Dressing: Wound #5 Right,Medial Calcaneus: Dry Gauze Boardered Foam Dressing Wound #7 Anterior Forearm: Gauze and Kerlix/Conform Dressing Change Frequency: Wound #5 Right,Medial Calcaneus: Change dressing every other day. Wound #7 Anterior Forearm: Change dressing every week Follow-up Appointments: Wound #7 Anterior Forearm: Return Appointment in 1 week. Off-Loading: Other: - float heels with pillow under calves when in bed; wear pressure relieving boots while in bed [name], [name]. ([id number]) Additional Orders / Instructions: Wound #5 Right,Medial Calcaneus: Increase protein intake. Activity as tolerated - VItamin C, A, ZINC, MVI Wound #7 Anterior Forearm: Increase protein intake. Activity as tolerated - VItamin C, A, ZINC, MVI I have recommended: 1. Prisma AG to the wounds on the right heel. This  is to be changed every other day 2. The forearm will be dressed with Mepitel, Sorbact with hydrogel and a layered dressing to be left in place for a week. 3. Good control of her diabetes 4. Adequate nutrition with protein supplements, vitamin A, vitamin C and zinc 5. Regular visits to the wound  center Electronic Signature(s) Signed: 03/06/2016 3:04:02 PM By: [name], FACS Entered By: [name] on 03/06/2016 15:04:02 [name] ([id number]) -------------------------------------------------------------------------------- SuperBill Details Patient Name: [name] Date of Service: 03/06/2016 Medical Record Number: [id number] Patient Account Number: [id number] Date of Birth/Sex: 09/21/1935 (80 y.o. Female) Treating RN: Carolyne Fiscal, Debi Primary Care Physician: Halina Maidens Other Clinician: Referring Physician: Halina Maidens Treating Physician/Extender: Frann Rider in Treatment: 13 Diagnosis Coding ICD-10 Codes Code Description E11.621 Type 2 diabetes mellitus with foot ulcer L89.613 Pressure ulcer of right heel, stage 3 Z79.01 Long term (current) use of anticoagulants S41.112A Laceration without foreign body of left upper arm, initial encounter S41.102A Unspecified open wound of left upper arm, initial encounter Facility Procedures CPT4 Code Description: [id number] - DEB SUBQ TISSUE 20 SQ CM/< ICD-10 Description Diagnosis E11.621 Type 2 diabetes mellitus with foot ulcer L89.613 Pressure ulcer of right heel, stage 3 Z79.01 Long term (current) use of anticoagulants S41.112A  Laceration without foreign body of left upper arm, Modifier: initial encoun Quantity: 1 ter Physician Procedures CPT4 Code Description: [id number] - WC PHYS SUBQ TISS 20 SQ CM ICD-10 Description Diagnosis E11.621 Type 2 diabetes mellitus with foot ulcer L89.613 Pressure ulcer of right heel, stage 3 Z79.01 Long term (current) use of anticoagulants S41.112A  Laceration without foreign body of left upper arm, Modifier: initial encoun Quantity: 1 ter Engineer, maintenance) Signed: 03/06/2016 3:04:15 PM By: [name], FACS Entered By: [name] on 03/06/2016 15:04:14

## 2016-03-10 DIAGNOSIS — J449 Chronic obstructive pulmonary disease, unspecified: Secondary | ICD-10-CM | POA: Diagnosis not present

## 2016-03-10 DIAGNOSIS — F0390 Unspecified dementia without behavioral disturbance: Secondary | ICD-10-CM | POA: Diagnosis not present

## 2016-03-10 DIAGNOSIS — L89614 Pressure ulcer of right heel, stage 4: Secondary | ICD-10-CM | POA: Diagnosis not present

## 2016-03-10 DIAGNOSIS — S72001D Fracture of unspecified part of neck of right femur, subsequent encounter for closed fracture with routine healing: Secondary | ICD-10-CM | POA: Diagnosis not present

## 2016-03-10 DIAGNOSIS — E1142 Type 2 diabetes mellitus with diabetic polyneuropathy: Secondary | ICD-10-CM | POA: Diagnosis not present

## 2016-03-10 DIAGNOSIS — I5032 Chronic diastolic (congestive) heart failure: Secondary | ICD-10-CM | POA: Diagnosis not present

## 2016-03-11 DIAGNOSIS — I5032 Chronic diastolic (congestive) heart failure: Secondary | ICD-10-CM | POA: Diagnosis not present

## 2016-03-11 DIAGNOSIS — S72001D Fracture of unspecified part of neck of right femur, subsequent encounter for closed fracture with routine healing: Secondary | ICD-10-CM | POA: Diagnosis not present

## 2016-03-11 DIAGNOSIS — L89614 Pressure ulcer of right heel, stage 4: Secondary | ICD-10-CM | POA: Diagnosis not present

## 2016-03-11 DIAGNOSIS — F0390 Unspecified dementia without behavioral disturbance: Secondary | ICD-10-CM | POA: Diagnosis not present

## 2016-03-11 DIAGNOSIS — E1142 Type 2 diabetes mellitus with diabetic polyneuropathy: Secondary | ICD-10-CM | POA: Diagnosis not present

## 2016-03-11 DIAGNOSIS — J449 Chronic obstructive pulmonary disease, unspecified: Secondary | ICD-10-CM | POA: Diagnosis not present

## 2016-03-13 ENCOUNTER — Encounter: Payer: Medicare Other | Attending: Nurse Practitioner | Admitting: Nurse Practitioner

## 2016-03-13 ENCOUNTER — Telehealth: Payer: Self-pay

## 2016-03-13 DIAGNOSIS — E1122 Type 2 diabetes mellitus with diabetic chronic kidney disease: Secondary | ICD-10-CM | POA: Insufficient documentation

## 2016-03-13 DIAGNOSIS — S41102A Unspecified open wound of left upper arm, initial encounter: Secondary | ICD-10-CM | POA: Insufficient documentation

## 2016-03-13 DIAGNOSIS — Z7901 Long term (current) use of anticoagulants: Secondary | ICD-10-CM | POA: Insufficient documentation

## 2016-03-13 DIAGNOSIS — M329 Systemic lupus erythematosus, unspecified: Secondary | ICD-10-CM | POA: Insufficient documentation

## 2016-03-13 DIAGNOSIS — F0390 Unspecified dementia without behavioral disturbance: Secondary | ICD-10-CM | POA: Diagnosis not present

## 2016-03-13 DIAGNOSIS — F419 Anxiety disorder, unspecified: Secondary | ICD-10-CM | POA: Diagnosis not present

## 2016-03-13 DIAGNOSIS — X58XXXA Exposure to other specified factors, initial encounter: Secondary | ICD-10-CM | POA: Diagnosis not present

## 2016-03-13 DIAGNOSIS — N189 Chronic kidney disease, unspecified: Secondary | ICD-10-CM | POA: Diagnosis not present

## 2016-03-13 DIAGNOSIS — Z87891 Personal history of nicotine dependence: Secondary | ICD-10-CM | POA: Diagnosis not present

## 2016-03-13 DIAGNOSIS — J449 Chronic obstructive pulmonary disease, unspecified: Secondary | ICD-10-CM | POA: Diagnosis not present

## 2016-03-13 DIAGNOSIS — R339 Retention of urine, unspecified: Secondary | ICD-10-CM | POA: Diagnosis not present

## 2016-03-13 DIAGNOSIS — I5032 Chronic diastolic (congestive) heart failure: Secondary | ICD-10-CM | POA: Diagnosis not present

## 2016-03-13 DIAGNOSIS — L89614 Pressure ulcer of right heel, stage 4: Secondary | ICD-10-CM | POA: Diagnosis not present

## 2016-03-13 DIAGNOSIS — E1142 Type 2 diabetes mellitus with diabetic polyneuropathy: Secondary | ICD-10-CM | POA: Diagnosis not present

## 2016-03-13 DIAGNOSIS — I480 Paroxysmal atrial fibrillation: Secondary | ICD-10-CM | POA: Diagnosis not present

## 2016-03-13 DIAGNOSIS — E11621 Type 2 diabetes mellitus with foot ulcer: Secondary | ICD-10-CM | POA: Diagnosis not present

## 2016-03-13 DIAGNOSIS — I509 Heart failure, unspecified: Secondary | ICD-10-CM | POA: Diagnosis not present

## 2016-03-13 DIAGNOSIS — L89613 Pressure ulcer of right heel, stage 3: Secondary | ICD-10-CM | POA: Insufficient documentation

## 2016-03-13 DIAGNOSIS — S41112A Laceration without foreign body of left upper arm, initial encounter: Secondary | ICD-10-CM | POA: Insufficient documentation

## 2016-03-13 DIAGNOSIS — S72001D Fracture of unspecified part of neck of right femur, subsequent encounter for closed fracture with routine healing: Secondary | ICD-10-CM | POA: Diagnosis not present

## 2016-03-13 NOTE — Telephone Encounter (Signed)
Rcvd message from [facility] [name] that patient fell out of bed and hit head last night. She was complaining of pain on R side of head as HH made visit today. I have attempted to call [facility] [phone]. I also called husband and LM to call us.

## 2016-03-14 DIAGNOSIS — I5032 Chronic diastolic (congestive) heart failure: Secondary | ICD-10-CM | POA: Diagnosis not present

## 2016-03-14 DIAGNOSIS — E1142 Type 2 diabetes mellitus with diabetic polyneuropathy: Secondary | ICD-10-CM | POA: Diagnosis not present

## 2016-03-14 DIAGNOSIS — F0390 Unspecified dementia without behavioral disturbance: Secondary | ICD-10-CM | POA: Diagnosis not present

## 2016-03-14 DIAGNOSIS — S72001D Fracture of unspecified part of neck of right femur, subsequent encounter for closed fracture with routine healing: Secondary | ICD-10-CM | POA: Diagnosis not present

## 2016-03-14 DIAGNOSIS — L89614 Pressure ulcer of right heel, stage 4: Secondary | ICD-10-CM | POA: Diagnosis not present

## 2016-03-14 DIAGNOSIS — J449 Chronic obstructive pulmonary disease, unspecified: Secondary | ICD-10-CM | POA: Diagnosis not present

## 2016-03-14 NOTE — Progress Notes (Addendum)
SYANN, MCDOUGALD ([id number]) Visit Report for 03/13/2016 Chief Complaint Document Details Patient Name: [name], [name] 03/13/2016 2:15 Date of Service: PM Medical Record [id number] Number: Patient Account Number: [id number] July 17, 1935 (80 y.o. Treating RN: Ahmed Prima Date of Birth/Sex: Female) Other Clinician: Primary Care Physician: Halina Maidens Treating Londell Moh Referring Physician: Halina Maidens Physician/Extender: Suella Grove in Treatment: 14 Information Obtained from: Patient Chief Complaint Patient is at the clinic for treatment of an open pressure ulcer to her right calcaneum which has been there for about 4 weeks Electronic Signature(s) Signed: 03/13/2016 4:03:42 PM By: Londell Moh FNP Entered By: Londell Moh on 03/13/2016 14:59:34 [name] ([id number]) -------------------------------------------------------------------------------- HPI Details Patient Name: [name] 03/13/2016 2:15 Date of Service: PM Medical Record [id number] Number: Patient Account Number: [id number] 10-13-1935 (80 y.o. Treating RN: Ahmed Prima Date of Birth/Sex: Female) Other Clinician: Primary Care Physician: Halina Maidens Treating Londell Moh Referring Physician: Halina Maidens Physician/Extender: Suella Grove in Treatment: 14 History of Present Illness Location: right posterior heel, and both lower extremities Quality: Patient reports experiencing a sharp pain to affected area(s). Severity: Patient states wound (s) are getting better. Duration: Patient has had the wound for < 4 weeks prior to presenting for treatment Timing: Pain in wound is Intermittent (comes and goes Context: The wound appeared gradually over time while she was in hospital with a right hip fracture Modifying Factors: Consults to this date include:was seen by her PCP and put on some antibiotics a while ago Associated Signs and Symptoms: Patient reports having difficulty  standing for long periods. HPI Description: 80 year old patient referred who was recently discharged in April of this year with a left heel ulcer now comes with a right heel ulcer and some ulcerations in both lower extremities which have all come along during her recent hospitalization which she had a right hip fracture. She has a past medical history of COPD, diabetes mellitus type 2 with chronic kidney disease, multi-infarct dementia, anxiety, urinary retention, paroxysmal atrial fibrillation. She has also had moderate malnutrition, multi-infarct dementia, congestive heart failure, left displaced femoral neck fracture, systemic lupus erythematosus. She is also status total abdominal hysterectomy with bilateral salpingo-oophorectomy, EGDs, hip arthroplasty on the left, cataract surgery. She has been a from a smoker and quit in May 2013 and she smoked for about 40 years. recently admitted to [facility] between January 19 and 07/03/2015 12/09/2015 -- o right foot x-ray -- IMPRESSION:Soft tissue ulceration of the heel. No underlying erosive bony abnormality. Diffuse degenerative change. No acute bony abnormality. 12/30/2015-- admitted to the hospital between July 12 and July 18 of 2017 and was treated for cerebral infarction, infected decubitus ulcer and aspiration pneumonia of the right lung. MRI was negative for stroke and was thought to be secondary to pneumonia. She was treated with clindamycin. Patient was discharged home on Keflex and Zyvox. Dr. Lucky Cowboy took her for an aortogram and a right lower extremity runoff and found to 30% stenosis in the mid to distal SFA but no significant stenosis in the popliteal artery and the SFA. There was a two-vessel runoff through the anterior tibial artery and peroneal artery without significant stenosis. felt her perfusion was adequate for wound healing. 02/28/2016 -- days ago she had a lacerated wound on her left forearm which they have  been treating locally and have asked me to take a look today. 03/13/16: returns today for f/u. the right posterior heel wound has achieved epithelialization. the left forearm wound is healing without issue. no systemic s/s  of infection. DELSEY, FIEDOR ([id number]) Electronic Signature(s) Signed: 03/13/2016 4:03:42 PM By: Londell Moh FNP Entered By: Londell Moh on 03/13/2016 15:00:25 [name] ([id number]) -------------------------------------------------------------------------------- Physical Exam Details Patient Name: Madison, [name] 03/13/2016 2:15 Date of Service: PM Medical Record [id number] Number: Patient Account Number: [id number] [birth year] (80 y.o. Treating RN: Ahmed Prima Date of Birth/Sex: Female) Other Clinician: Primary Care Physician: Halina Maidens Treating Londell Moh Referring Physician: Halina Maidens Physician/Extender: Suella Grove in Treatment: 14 Constitutional Patient's appearance is neat and clean. Appears in no acute distress. Well nourished and well developed.. Ears, Nose, Mouth, and Throat Patient can hear normal speaking tones without difficulty.. Cardiovascular Extremities are free of varicosities, clubbing or edema. Peripheral pulses strong and equal. Capillary refill < 3 seconds.. Psychiatric Judgement and insight intact.. Alert and oriented times 3.. Short and long term memory intact.. No evidence of depression, anxiety, or agitation. Calm, cooperative, and communicative. Appropriate interactions and affect.. Electronic Signature(s) Signed: 03/13/2016 4:03:42 PM By: Londell Moh FNP Entered By: Londell Moh on 03/13/2016 15:00:59 [name] ([id number]) -------------------------------------------------------------------------------- Physician Orders Details Patient Name: Madison, [name] 03/13/2016 2:15 Date of Service: PM Medical Record [id number] Number: Patient Account Number: [id number] August 25, 1935  (80 y.o. Treating RN: [name] Date of Birth/Sex: Female) Other Clinician: Primary Care Physician: Halina Maidens Treating Londell Moh Referring Physician: Halina Maidens Physician/Extender: Suella Grove in Treatment: 14 Verbal / Phone Orders: Yes Clinician: [name] Read Back and Verified: Yes Diagnosis Coding ICD-10 Coding Code Description E11.621 Type 2 diabetes mellitus with foot ulcer L89.613 Pressure ulcer of right heel, stage 3 Z79.01 Long term (current) use of anticoagulants S41.112A Laceration without foreign body of left upper arm, initial encounter S41.102A Unspecified open wound of left upper arm, initial encounter Wound Cleansing Wound #7 Left,Anterior Forearm o Clean wound with Normal Saline. - in clinic o Cleanse wound with mild soap and water Anesthetic Wound #7 Left,Anterior Forearm o Topical Lidocaine 4% cream applied to wound bed prior to debridement Primary Wound Dressing Wound #7 Left,Anterior Forearm o Other: - mepitel, hydrogel, sorbact Secondary Dressing Wound #7 Left,Anterior Forearm o Gauze and Kerlix/Conform Dressing Change Frequency Wound #7 Left,Anterior Forearm o Change dressing every week Follow-up Appointments Wound #7 Left,Anterior Forearm o Return Appointment in 1 week. [name] ([id number]) Off-Loading o Other: - float heels with pillow under calves when in bed; wear pressure relieving boots while in bed Additional Orders / Instructions Wound #7 Left,Anterior Forearm o Increase protein intake. o Activity as tolerated - VItamin C, A, ZINC, MVI Electronic Signature(s) Signed: 03/13/2016 4:03:42 PM By: Londell Moh FNP Signed: 03/13/2016 5:16:43 PM By: [name] Entered By: [name] on 03/13/2016 14:49:51 [name] ([id number]) -------------------------------------------------------------------------------- Problem List Details Patient Name: TOMEEKA, BORTZ 03/13/2016  2:15 Date of Service: PM Medical Record [id number] Number: Patient Account Number: [id number] [birth year] (80 y.o. Treating RN: Ahmed Prima Date of Birth/Sex: Female) Other Clinician: Primary Care Physician: Halina Maidens Treating Londell Moh Referring Physician: Halina Maidens Physician/Extender: Suella Grove in Treatment: 14 Active Problems ICD-10 Encounter Code Description Active Date Diagnosis E11.621 Type 2 diabetes mellitus with foot ulcer 12/02/2015 Yes L89.613 Pressure ulcer of right heel, stage 3 12/02/2015 Yes Z79.01 Long term (current) use of anticoagulants 12/02/2015 Yes S41.112A Laceration without foreign body of left upper arm, initial 02/28/2016 Yes encounter S41.102A Unspecified open wound of left upper arm, initial 02/28/2016 Yes encounter Inactive Problems Resolved Problems ICD-10 Code Description Active Date Resolved Date L97.212 Non-pressure chronic ulcer of right calf with fat layer 12/02/2015 12/02/2015 exposed  [id number] Non-pressure chronic ulcer of right calf limited to 12/02/2015 12/02/2015 breakdown of skin MARIETOU, HARPSTER ([id number]) Electronic Signature(s) Signed: 03/13/2016 4:03:42 PM By: Londell Moh FNP Entered By: Londell Moh on 03/13/2016 14:59:20 [name] ([id number]) -------------------------------------------------------------------------------- Progress Note Details Patient Name: ARLINDA, COTTOM 03/13/2016 2:15 Date of Service: PM Medical Record [id number] Number: Patient Account Number: [id number] December 15, 1935 (80 y.o. Treating RN: Ahmed Prima Date of Birth/Sex: Female) Other Clinician: Primary Care Physician: Halina Maidens Treating Londell Moh Referring Physician: Halina Maidens Physician/Extender: Suella Grove in Treatment: 14 Subjective Chief Complaint Information obtained from Patient Patient is at the clinic for treatment of an open pressure ulcer to her right calcaneum which has been there for about 4  weeks History of Present Illness (HPI) The following HPI elements were documented for the patient's wound: Location: right posterior heel, and both lower extremities Quality: Patient reports experiencing a sharp pain to affected area(s). Severity: Patient states wound (s) are getting better. Duration: Patient has had the wound for < 4 weeks prior to presenting for treatment Timing: Pain in wound is Intermittent (comes and goes Context: The wound appeared gradually over time while she was in hospital with a right hip fracture Modifying Factors: Consults to this date include:was seen by her PCP and put on some antibiotics a while ago Associated Signs and Symptoms: Patient reports having difficulty standing for long periods. 80 year old patient referred who was recently discharged in April of this year with a left heel ulcer now comes with a right heel ulcer and some ulcerations in both lower extremities which have all come along during her recent hospitalization which she had a right hip fracture. She has a past medical history of COPD, diabetes mellitus type 2 with chronic kidney disease, multi-infarct dementia, anxiety, urinary retention, paroxysmal atrial fibrillation. She has also had moderate malnutrition, multi-infarct dementia, congestive heart failure, left displaced femoral neck fracture, systemic lupus erythematosus. She is also status total abdominal hysterectomy with bilateral salpingo-oophorectomy, EGDs, hip arthroplasty on the left, cataract surgery. She has been a from a smoker and quit in May 2013 and she smoked for about 40 years. recently admitted to [facility] between January 19 and 07/03/2015 12/09/2015 -- right foot x-ray -- IMPRESSION:Soft tissue ulceration of the heel. No underlying erosive bony abnormality. Diffuse degenerative change. No acute bony abnormality. 12/30/2015-- admitted to the hospital between July 12 and July 18 of 2017 and was  treated for cerebral infarction, infected decubitus ulcer and aspiration pneumonia of the right lung. MRI was negative for stroke and was thought to be secondary to pneumonia. She was treated with clindamycin. Patient was discharged home on Keflex and Zyvox. [name] ([id number]) Dr. Lucky Cowboy took her for an aortogram and a right lower extremity runoff and found to 30% stenosis in the mid to distal SFA but no significant stenosis in the popliteal artery and the SFA. There was a two-vessel runoff through the anterior tibial artery and peroneal artery without significant stenosis. felt her perfusion was adequate for wound healing. 02/28/2016 -- days ago she had a lacerated wound on her left forearm which they have been treating locally and have asked me to take a look today. 03/13/16: returns today for f/u. the right posterior heel wound has achieved epithelialization. the left forearm wound is healing without issue. no systemic s/s of infection. Objective Constitutional Patient's appearance is neat and clean. Appears in no acute distress. Well nourished and well developed.. Vitals Time Taken: 2:20 PM, Height: 64 in, Weight:  110 lbs, BMI: 18.9, Pulse: 60 bpm, Respiratory Rate: 16 breaths/min, Blood Pressure: 142/60 mmHg. Ears, Nose, Mouth, and Throat Patient can hear normal speaking tones without difficulty.. Cardiovascular Extremities are free of varicosities, clubbing or edema. Peripheral pulses strong and equal. Capillary refill < 3 seconds.. Psychiatric Judgement and insight intact.. Alert and oriented times 3.. Short and long term memory intact.. No evidence of depression, anxiety, or agitation. Calm, cooperative, and communicative. Appropriate interactions and affect.. Integumentary (Hair, Skin) Wound #5 status is Healed - Epithelialized. Original cause of wound was Gradually Appeared. The wound is located on the Right,Medial Calcaneus. The wound measures 0cm length x 0cm width x  0cm depth; 0cm^2 area and 0cm^3 volume. The wound is limited to skin breakdown. There is a small amount of serous drainage noted. The wound margin is distinct with the outline attached to the wound base. There is large (67-100%) pink, pale granulation within the wound bed. There is a small (1-33%) amount of necrotic tissue within the wound bed including Adherent Slough. The periwound skin appearance exhibited: Moist. The periwound skin appearance did not exhibit: Callus, Crepitus, Excoriation, Fluctuance, Friable, Induration, Localized Edema, Rash, Scarring, Dry/Scaly, Maceration, Atrophie Blanche, Cyanosis, Ecchymosis, Hemosiderin Staining, Mottled, Pallor, Rubor, Erythema. Periwound temperature was noted as No Abnormality. The periwound has tenderness on palpation. NOBIE, WISSMAN ([id number]) Wound #7 status is Open. Original cause of wound was Trauma. The wound is located on the Left,Anterior Forearm. The wound measures 2cm length x 0.5cm width x 0.1cm depth; 0.785cm^2 area and 0.079cm^3 volume. There is no tunneling or undermining noted. There is a large amount of serosanguineous drainage noted. The wound margin is distinct with the outline attached to the wound base. There is large (67-100%) red granulation within the wound bed. There is a small (1-33%) amount of necrotic tissue within the wound bed including Eschar. The periwound skin appearance exhibited: Moist. Periwound temperature was noted as No Abnormality. The periwound has tenderness on palpation. Assessment Active Problems ICD-10 E11.621 - Type 2 diabetes mellitus with foot ulcer L89.613 - Pressure ulcer of right heel, stage 3 Z79.01 - Long term (current) use of anticoagulants S41.112A - Laceration without foreign body of left upper arm, initial encounter S41.102A - Unspecified open wound of left upper arm, initial encounter Diagnoses ICD-10 E11.621: Type 2 diabetes mellitus with foot ulcer L89.613: Pressure ulcer of  right heel, stage 3 Z79.01: Long term (current) use of anticoagulants S41.112A: Laceration without foreign body of left upper arm, initial encounter S41.102A: Unspecified open wound of left upper arm, initial encounter Plan Wound Cleansing: Wound #7 Left,Anterior Forearm: Clean wound with Normal Saline. - in clinic Cleanse wound with mild soap and water Anesthetic: Wound #7 Left,Anterior Forearm: Topical Lidocaine 4% cream applied to wound bed prior to debridement Primary Wound Dressing: Wound #7 Left,Anterior Forearm: Other: - mepitel, hydrogel, sorbact Secondary Dressing: Wound #7 Left,Anterior Forearm: Gauze and Kerlix/Conform TYLEE, FERRINI ([id number]) Dressing Change Frequency: Wound #7 Left,Anterior Forearm: Change dressing every week Follow-up Appointments: Wound #7 Left,Anterior Forearm: Return Appointment in 1 week. Off-Loading: Other: - float heels with pillow under calves when in bed; wear pressure relieving boots while in bed Additional Orders / Instructions: Wound #7 Left,Anterior Forearm: Increase protein intake. Activity as tolerated - VItamin C, A, ZINC, MVI Follow-Up Appointments: A follow-up appointment should be scheduled. A Patient Clinical Summary of Care was provided to LK 1. discussed clinical findings with pt, son and caregiver. all questions were answered. 2. post-epithelialization care of right calcaneal wound discussed. all  questions were answered. 3. see orders above. Electronic Signature(s) Signed: 04/05/2016 4:13:02 PM By: Londell Moh FNP Previous Signature: 03/13/2016 4:03:42 PM Version By: Londell Moh FNP Entered By: Londell Moh on 04/05/2016 16:11:33 [name] ([id number]) -------------------------------------------------------------------------------- [place] Details Patient Name: [name] Date of Service: 03/13/2016 Medical Record Number: [id number] Patient Account Number: [id number] Date of Birth/Sex:  1935/11/06 (79 y.o. Female) Treating RN: Carolyne Fiscal, Debi Primary Care Physician: Halina Maidens Other Clinician: Referring Physician: Halina Maidens Treating Physician/Extender: Loistine Chance in Treatment: 14 Diagnosis Coding ICD-10 Codes Code Description E11.621 Type 2 diabetes mellitus with foot ulcer L89.613 Pressure ulcer of right heel, stage 3 Z79.01 Long term (current) use of anticoagulants S41.112A Laceration without foreign body of left upper arm, initial encounter S41.102A Unspecified open wound of left upper arm, initial encounter Facility Procedures CPT4 Code: [id number] Description: [facility] VISIT-LEV 3 EST PT Modifier: Quantity: 1 Physician Procedures CPT4 Code Description: [id number] - WC PHYS LEVEL 3 - EST PT ICD-10 Description Diagnosis E11.621 Type 2 diabetes mellitus with foot ulcer L89.613 Pressure ulcer of right heel, stage 3 S41.112A Laceration without foreign body of left upper arm,  S41.102A Unspecified open wound of left upper arm, initial e Modifier: initial encount ncounter Quantity: 1 er Engineer, maintenance) Signed: 03/16/2016 4:28:52 PM By: Gretta Cool, RN, BSN, Kim RN, BSN Signed: 03/17/2016 4:21:25 PM By: Londell Moh FNP Previous Signature: 03/13/2016 4:03:42 PM Version By: Londell Moh FNP Entered By: Gretta Cool RN, BSN, Kim on 03/16/2016 08:10:07

## 2016-03-14 NOTE — Progress Notes (Signed)
MACALA, PROKOSCH ([id number]) Visit Report for 03/13/2016 Arrival Information Details Patient Name: ELIZEBETH, RUSHMORE. Date of Service: 03/13/2016 2:15 PM Medical Record Number: [id number] Patient Account Number: [id number] Date of Birth/Sex: 1936/05/06 (80 y.o. Female) Treating RN: Cornell Barman Primary Care Physician: Halina Maidens Other Clinician: Referring Physician: Halina Maidens Treating Physician/Extender: Loistine Chance in Treatment: 14 Visit Information History Since Last Visit Added or deleted any medications: No Patient Arrived: Ambulatory Any new allergies or adverse reactions: No Arrival Time: 14:22 Had a fall or experienced change in No Accompanied By: caregiver and activities of daily living that may affect husband risk of falls: Transfer Assistance: None Signs or symptoms of abuse/neglect since last No Patient Identification Verified: Yes visito Secondary Verification Process Yes Hospitalized since last visit: No Completed: Has Dressing in Place as Prescribed: Yes Patient Requires Transmission- No Pain Present Now: No Based Precautions: Patient Has Alerts: Yes Patient Alerts: Patient on Blood Thinner Electronic Signature(s) Signed: 03/13/2016 5:16:12 PM By: Gretta Cool, RN, BSN, Kim RN, BSN Entered By: Gretta Cool, RN, BSN, Kim on 03/13/2016 14:23:33 [name] ([id number]) -------------------------------------------------------------------------------- Clinic Level of Care Assessment Details Patient Name: [name] Date of Service: 03/13/2016 2:15 PM Medical Record Number: [id number] Patient Account Number: [id number] Date of Birth/Sex: 03-22-1936 (80 y.o. Female) Treating RN: [name] Primary Care Physician: Halina Maidens Other Clinician: Referring Physician: Halina Maidens Treating Physician/Extender: Loistine Chance in Treatment: 14 Clinic Level of Care Assessment Items TOOL 4 Quantity Score []  - Use when only an EandM is  performed on FOLLOW-UP visit 0 ASSESSMENTS - Nursing Assessment / Reassessment X - Reassessment of Co-morbidities (includes updates in patient status) 1 10 X - Reassessment of Adherence to Treatment Plan 1 5 ASSESSMENTS - Wound and Skin Assessment / Reassessment []  - Simple Wound Assessment / Reassessment - one wound 0 X - Complex Wound Assessment / Reassessment - multiple wounds 2 5 []  - Dermatologic / Skin Assessment (not related to wound area) 0 ASSESSMENTS - Focused Assessment []  - Circumferential Edema Measurements - multi extremities 0 []  - Nutritional Assessment / Counseling / Intervention 0 X - Lower Extremity Assessment (monofilament, tuning fork, pulses) 1 5 []  - Peripheral Arterial Disease Assessment (using hand held doppler) 0 ASSESSMENTS - Ostomy and/or Continence Assessment and Care []  - Incontinence Assessment and Management 0 []  - Ostomy Care Assessment and Management (repouching, etc.) 0 PROCESS - Coordination of Care X - Simple Patient / Family Education for ongoing care 1 15 []  - Complex (extensive) Patient / Family Education for ongoing care 0 []  - Staff obtains Programmer, systems, Records, Test Results / Process Orders 0 []  - Staff telephones HHA, Nursing Homes / Clarify orders / etc 0 []  - Routine Transfer to another Facility (non-emergent condition) 0 BRAELEIGH, BRONN ([id number]) []  - Routine Hospital Admission (non-emergent condition) 0 []  - New Admissions / Biomedical engineer / Ordering NPWT, Apligraf, etc. 0 []  - Emergency Hospital Admission (emergent condition) 0 X - Simple Discharge Coordination 1 10 []  - Complex (extensive) Discharge Coordination 0 PROCESS - Special Needs []  - Pediatric / Minor Patient Management 0 []  - Isolation Patient Management 0 []  - Hearing / Language / Visual special needs 0 []  - Assessment of Community assistance (transportation, D/C planning, etc.) 0 []  - Additional assistance / Altered mentation 0 []  - Support Surface(s)  Assessment (bed, cushion, seat, etc.) 0 INTERVENTIONS - Wound Cleansing / Measurement []  - Simple Wound Cleansing - one wound 0 X - Complex Wound Cleansing - multiple wounds 2  5 X - Wound Imaging (photographs - any number of wounds) 1 5 []  - Wound Tracing (instead of photographs) 0 []  - Simple Wound Measurement - one wound 0 X - Complex Wound Measurement - multiple wounds 2 5 INTERVENTIONS - Wound Dressings X - Small Wound Dressing one or multiple wounds 1 10 []  - Medium Wound Dressing one or multiple wounds 0 []  - Large Wound Dressing one or multiple wounds 0 []  - Application of Medications - topical 0 []  - Application of Medications - injection 0 INTERVENTIONS - Miscellaneous []  - External ear exam 0 DESIRREE, INWOOD ([id number]) []  - Specimen Collection (cultures, biopsies, blood, body fluids, etc.) 0 []  - Specimen(s) / Culture(s) sent or taken to Lab for analysis 0 []  - Patient Transfer (multiple staff / Harrel Lemon Lift / Similar devices) 0 []  - Simple Staple / Suture removal (25 or less) 0 []  - Complex Staple / Suture removal (26 or more) 0 []  - Hypo / Hyperglycemic Management (close monitor of Blood Glucose) 0 []  - Ankle / Brachial Index (ABI) - do not check if billed separately 0 X - Vital Signs 1 5 Has the patient been seen at the hospital within the last three years: Yes Total Score: 95 Level Of Care: New/Established - Level 3 Electronic Signature(s) Signed: 03/13/2016 5:16:43 PM By: [name] Entered By: [name] on 03/13/2016 16:21:11 [name] ([id number]) -------------------------------------------------------------------------------- Encounter Discharge Information Details Patient Name: [name] Date of Service: 03/13/2016 2:15 PM Medical Record Number: [id number] Patient Account Number: [id number] Date of Birth/Sex: 03/20/1936 (80 y.o. Female) Treating RN: Carolyne Fiscal, Debi Primary Care Physician: Halina Maidens Other Clinician: Referring  Physician: Halina Maidens Treating Physician/Extender: Loistine Chance in Treatment: 14 Encounter Discharge Information Items Discharge Pain Level: 0 Discharge Condition: Stable Ambulatory Status: Wheelchair Discharge Destination: Home Transportation: Private Auto Accompanied By: Mady Gemma Schedule Follow-up Appointment: Yes Medication Reconciliation completed and provided to Patient/Care No Vinnie Bobst: Provided on Clinical Summary of Care: 03/13/2016 Form Type Recipient Paper Patient LK Electronic Signature(s) Signed: 03/13/2016 4:22:10 PM By: [name] Previous Signature: 03/13/2016 3:06:27 PM Version By: Ruthine Dose Entered By: [name] on 03/13/2016 16:22:10 [name] ([id number]) -------------------------------------------------------------------------------- Lower Extremity Assessment Details Patient Name: [name] Date of Service: 03/13/2016 2:15 PM Medical Record Number: [id number] Patient Account Number: [id number] Date of Birth/Sex: April 17, 1936 (79 y.o. Female) Treating RN: Cornell Barman Primary Care Physician: Halina Maidens Other Clinician: Referring Physician: Halina Maidens Treating Physician/Extender: Loistine Chance in Treatment: 14 Vascular Assessment Pulses: Posterior Tibial Dorsalis Pedis Palpable: [Right:Yes] Extremity colors, hair growth, and conditions: Extremity Color: [Right:Normal] Hair Growth on Extremity: [Right:No] Temperature of Extremity: [Right:Warm] Capillary Refill: [Right:< 3 seconds] Dependent Rubor: [Right:No] Blanched when Elevated: [Right:No] Lipodermatosclerosis: [Right:No] Toe Nail Assessment Left: Right: Thick: No Discolored: No Deformed: No Improper Length and Hygiene: No Electronic Signature(s) Signed: 03/13/2016 5:16:12 PM By: Gretta Cool, RN, BSN, Kim RN, BSN Entered By: Gretta Cool, RN, BSN, Kim on 03/13/2016 14:31:25 [name]  ([id number]) -------------------------------------------------------------------------------- Multi Wound Chart Details Patient Name: [name] Date of Service: 03/13/2016 2:15 PM Medical Record Number: [id number] Patient Account Number: [id number] Date of Birth/Sex: 02-Feb-1936 (79 y.o. 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] [7:Trauma, Other] [N/A:N/A] Date Acquired: [5:11/03/2015] [7:02/24/2016] [N/A:N/A] Weeks of Treatment: [5:14] [7:2] [N/A:N/A] Wound Status: [5:Healed - Epithelialized] [7:Open] [N/A:N/A] Measurements L x W x D 0x0x0 [7:2x0.5x0.1] [N/A:N/A] (cm) Area (cm) : [5:0] [7:0.785] [N/A:N/A] Volume (cm) : [5:0] [7:0.079] [N/A:N/A] % Reduction in Area: [5:100.00%] [7:88.60%] [N/A:N/A] % Reduction in Volume: 100.00% [7:88.50%] [N/A:N/A] Classification: [5:Grade 1] [7:Partial Thickness] [N/A:N/A] Periwound Skin Texture: No Abnormalities Noted [7:No Abnormalities Noted] [N/A:N/A] Periwound Skin [5:No Abnormalities Noted] [7:No Abnormalities Noted] [N/A:N/A] Moisture: Periwound Skin Color: No Abnormalities Noted [7:No Abnormalities Noted] [N/A:N/A] Tenderness on [5:No] [7:No] [N/A:N/A] Treatment Notes Electronic Signature(s) Signed: 03/13/2016 5:16:43 PM By: [name] Entered By: [name] on 03/13/2016 14:51:47 [name] ([name]  ([id number]) -------------------------------------------------------------------------------- [place] Details Patient Name: [name]. Date of Service: 03/13/2016 2:15 PM Medical Record Number: [id number] Patient Account Number: [id number] Date of Birth/Sex: 05/07/1936 (79 y.o. Female) Treating RN: [name] Primary Care Physician: Halina Maidens Other Clinician: Referring Physician: Halina Maidens Treating Physician/Extender: Loistine Chance in Treatment: 14 Active Inactive Abuse / Safety / Falls / Self Care Management Nursing Diagnoses: Potential for falls Goals: Patient/caregiver will verbalize understanding of skin care regimen Date Initiated: 12/02/2015 Goal Status: Active Patient/caregiver will verbalize/demonstrate measures taken to prevent injury and/or falls Date Initiated: 12/02/2015 Goal Status: Active Interventions: Assess fall risk on admission and as needed Assess: immobility, friction, shearing, incontinence upon admission and as needed Assess impairment of mobility on admission and as needed per policy Assess self care needs on admission and as needed Provide education on fall prevention Provide education on safe transfers Notes: Wound/Skin Impairment Nursing Diagnoses: Impaired tissue integrity Goals: Patient/caregiver will verbalize understanding of skin care regimen Date Initiated: 12/02/2015 Goal Status: Active Interventions: Assess patient/caregiver ability to obtain necessary supplies Assess patient/caregiver ability to perform ulcer/skin care regimen upon admission and as needed ALLISHIA, STARKEY ([id number]) Assess ulceration(s) every visit Provide education on ulcer and skin care Notes: Electronic Signature(s) Signed: 03/13/2016 5:16:43 PM By: [name] Entered By: [name] on 03/13/2016 14:51:28 [name]  ([id number]) -------------------------------------------------------------------------------- Pain Assessment Details Patient Name: [name] Date of Service: 03/13/2016 2:15 PM Medical Record Number: [id number] Patient Account Number: [id number] Date of Birth/Sex: January 31, 1936 (79 y.o. Female) Treating RN: Cornell Barman Primary Care Physician: Halina Maidens Other Clinician: Referring Physician: Halina Maidens Treating Physician/Extender: Loistine Chance in Treatment: 14 Active Problems Location of Pain Severity and Description of Pain Patient Has Paino Yes Site Locations Pain Location: Generalized Pain With Dressing Change: No Pain Management and Medication Current Pain Management: Electronic Signature(s) Signed: 03/13/2016 5:16:12 PM By: Gretta Cool, RN, BSN, Kim RN, BSN Entered By: Gretta Cool, RN, BSN, Kim on 03/13/2016 14:23:46 [name] ([id number]) -------------------------------------------------------------------------------- Patient/Caregiver Education Details Patient Name: [name] Date of Service: 03/13/2016 2:15 PM Medical Record Number: [id number] Patient Account Number: [id number] Date of Birth/Gender: [birth year] (80 y.o. Female) Treating RN: [name] Primary Care Physician: Halina Maidens Other Clinician: Referring Physician: Halina Maidens Treating Physician/Extender: Loistine Chance in Treatment: 14 Education Assessment Education Provided To: Caregiver Education Topics Provided Basic Hygiene: Handouts: Other: care of newly healed ulcer site Methods: Explain/Verbal Responses: State content correctly Electronic Signature(s) Signed: 03/13/2016 5:16:43 PM By: [name] Entered By: [name] on 03/13/2016 16:22:28 [name] ([id number]) -------------------------------------------------------------------------------- Wound Assessment Details Patient Name: [name] Date of Service: 03/13/2016 2:15  PM Medical Record Number: [id number] Patient  Account Number: [id number] Date of Birth/Sex: Sep 06, 1935 (79 y.o. Female) Treating RN: [name] Primary Care Physician: Halina Maidens Other Clinician: Referring Physician: Halina Maidens Treating Physician/Extender: Loistine Chance in Treatment: 14 Wound Status Wound Number: 5 Primary Diabetic Wound/Ulcer of the Lower Etiology: Extremity Wound Location: Right Calcaneus - Medial Wound Healed - Epithelialized Wounding Event: Gradually Appeared Status: Date Acquired: 11/03/2015 Comorbid Chronic Obstructive Pulmonary Disease Weeks Of Treatment: 14 History: (COPD), Angina, Congestive Heart Clustered Wound: No Failure, Type II Diabetes, Lupus Erythematosus, Osteoarthritis, Dementia Photos Wound Measurements Length: (cm) Width: (cm) Depth: (cm) Area: (cm) Volume: (cm) 0 % Reduction in Area: 100% 0 % Reduction in Volume: 100% 0 Epithelialization: Large (67-100%) 0 0 Wound Description Classification: Grade 1 Wound Margin: Distinct, outline attached Exudate Amount: Small Exudate Type: Serous Exudate Color: amber Foul Odor After Cleansing: Yes Due to Product Use: No Wound Bed Granulation Amount: Large (67-100%) Exposed Structure Granulation Quality: Pink, Pale Fascia Exposed: No Necrotic Amount: Small (1-33%) Fat Layer Exposed: No Necrotic Quality: Adherent Slough Tendon Exposed: No MALEIAH, FIECHTNER ([id number]) Muscle Exposed: No Joint Exposed: No Bone Exposed: No Limited to Skin Breakdown Periwound Skin Texture Texture Color No Abnormalities Noted: No No Abnormalities Noted: No Callus: No Atrophie Blanche: No Crepitus: No Cyanosis: No Excoriation: No Ecchymosis: No Fluctuance: No Erythema: No Friable: No Hemosiderin Staining: No Induration: No Mottled: No Localized Edema: No Pallor: No Rash: No Rubor: No Scarring: No Temperature / Pain Moisture Temperature: No Abnormality No  Abnormalities Noted: No Tenderness on Palpation: Yes Dry / Scaly: No Maceration: No Moist: Yes Wound Preparation Ulcer Cleansing: Rinsed/Irrigated with Saline Topical Anesthetic Applied: Other: lidocaine 4%, Electronic Signature(s) Signed: 03/13/2016 5:16:43 PM By: [name] Entered By: [name] on 03/13/2016 14:53:08 [name] ([id number]) -------------------------------------------------------------------------------- Wound Assessment Details Patient Name: [name] Date of Service: 03/13/2016 2:15 PM Medical Record Number: [id number] Patient Account Number: [id number] Date of Birth/Sex: 03-09-1936 (79 y.o. Female) Treating RN: Cornell Barman Primary Care Physician: Halina Maidens Other Clinician: Referring Physician: Halina Maidens Treating Physician/Extender: Loistine Chance in Treatment: 14 Wound Status Wound Number: 7 Primary Trauma, Other Etiology: Wound Location: Left Forearm - Anterior Wound Open Wounding Event: Trauma Status: Date Acquired: 02/24/2016 Comorbid Chronic Obstructive Pulmonary Disease Weeks Of Treatment: 2 History: (COPD), Angina, Congestive Heart Clustered Wound: No Failure, Type II Diabetes, Lupus Erythematosus, Osteoarthritis, Dementia Photos Wound Measurements Length: (cm) 2 Width: (cm) 0.5 Depth: (cm) 0.1 Area: (cm) 0.785 Volume: (cm) 0.079 % Reduction in Area: 88.6% % Reduction in Volume: 88.5% Epithelialization: None Tunneling: No Undermining: No Wound Description Classification: Partial Thickness Wound Margin: Distinct, outline attached Exudate Amount: Large Exudate Type: Serosanguineous Exudate Color: red, brown Foul Odor After Cleansing: No Wound Bed Granulation Amount: Large (67-100%) Granulation Quality: Red Necrotic Amount: Small (1-33%) Necrotic Quality: LOUNELL, FENNO ([id number]) Periwound Skin Texture Texture Color No Abnormalities Noted: No No Abnormalities Noted:  No Moisture Temperature / Pain No Abnormalities Noted: No Temperature: No Abnormality Moist: Yes Tenderness on Palpation: Yes Wound Preparation Ulcer Cleansing: Rinsed/Irrigated with Saline Topical Anesthetic Applied: Other: lidocaine 4%, Treatment Notes Wound #7 (Left, Anterior Forearm) 1. Cleansed with: Clean wound with Normal Saline 2. Anesthetic Topical Lidocaine 4% cream to wound bed prior to debridement 4. Dressing Applied: Mepitel Other dressing (specify in notes) 5. Secondary Dressing Applied Gauze and Kerlix/Conform Notes hydrogel, sorbact, coban lightly to secure Electronic Signature(s) Signed: 03/13/2016 5:16:12 PM By: Gretta Cool RN, BSN, Kim RN, BSN Signed: 03/13/2016 5:16:43 PM By: [name] Entered By:  [name] on 03/13/2016 14:52:37 [name] ([id number]) -------------------------------------------------------------------------------- [place] Details Patient Name: [name] Date of Service: 03/13/2016 2:15 PM Medical Record Number: [id number] Patient Account Number: [id number] Date of Birth/Sex: 1936/06/08 (79 y.o. Female) Treating RN: Cornell Barman Primary Care Physician: Halina Maidens Other Clinician: Referring Physician: Halina Maidens Treating Physician/Extender: Loistine Chance in Treatment: 14 Vital Signs Time Taken: 14:20 Pulse (bpm): 60 Height (in): 64 Respiratory Rate (breaths/min): 16 Weight (lbs): 110 Blood Pressure (mmHg): 142/60 Body Mass Index (BMI): 18.9 Reference Range: 80 - 120 mg / dl Electronic Signature(s) Signed: 03/13/2016 5:16:12 PM By: Gretta Cool, RN, BSN, Kim RN, BSN Entered By: Gretta Cool, RN, BSN, Kim on 03/13/2016 14:24:08

## 2016-03-15 DIAGNOSIS — L89614 Pressure ulcer of right heel, stage 4: Secondary | ICD-10-CM | POA: Diagnosis not present

## 2016-03-15 DIAGNOSIS — I5032 Chronic diastolic (congestive) heart failure: Secondary | ICD-10-CM | POA: Diagnosis not present

## 2016-03-15 DIAGNOSIS — F0390 Unspecified dementia without behavioral disturbance: Secondary | ICD-10-CM | POA: Diagnosis not present

## 2016-03-15 DIAGNOSIS — J449 Chronic obstructive pulmonary disease, unspecified: Secondary | ICD-10-CM | POA: Diagnosis not present

## 2016-03-15 DIAGNOSIS — E1142 Type 2 diabetes mellitus with diabetic polyneuropathy: Secondary | ICD-10-CM | POA: Diagnosis not present

## 2016-03-15 DIAGNOSIS — S72001D Fracture of unspecified part of neck of right femur, subsequent encounter for closed fracture with routine healing: Secondary | ICD-10-CM | POA: Diagnosis not present

## 2016-03-17 DIAGNOSIS — F0390 Unspecified dementia without behavioral disturbance: Secondary | ICD-10-CM | POA: Diagnosis not present

## 2016-03-17 DIAGNOSIS — I5032 Chronic diastolic (congestive) heart failure: Secondary | ICD-10-CM | POA: Diagnosis not present

## 2016-03-17 DIAGNOSIS — J449 Chronic obstructive pulmonary disease, unspecified: Secondary | ICD-10-CM | POA: Diagnosis not present

## 2016-03-17 DIAGNOSIS — E1142 Type 2 diabetes mellitus with diabetic polyneuropathy: Secondary | ICD-10-CM | POA: Diagnosis not present

## 2016-03-17 DIAGNOSIS — S72001D Fracture of unspecified part of neck of right femur, subsequent encounter for closed fracture with routine healing: Secondary | ICD-10-CM | POA: Diagnosis not present

## 2016-03-17 DIAGNOSIS — L89614 Pressure ulcer of right heel, stage 4: Secondary | ICD-10-CM | POA: Diagnosis not present

## 2016-03-20 ENCOUNTER — Encounter: Payer: Medicare Other | Admitting: Nurse Practitioner

## 2016-03-20 DIAGNOSIS — S41102A Unspecified open wound of left upper arm, initial encounter: Secondary | ICD-10-CM | POA: Diagnosis not present

## 2016-03-20 DIAGNOSIS — I5032 Chronic diastolic (congestive) heart failure: Secondary | ICD-10-CM | POA: Diagnosis not present

## 2016-03-20 DIAGNOSIS — F0390 Unspecified dementia without behavioral disturbance: Secondary | ICD-10-CM | POA: Diagnosis not present

## 2016-03-20 DIAGNOSIS — L89613 Pressure ulcer of right heel, stage 3: Secondary | ICD-10-CM | POA: Diagnosis not present

## 2016-03-20 DIAGNOSIS — E11621 Type 2 diabetes mellitus with foot ulcer: Secondary | ICD-10-CM | POA: Diagnosis not present

## 2016-03-20 DIAGNOSIS — J449 Chronic obstructive pulmonary disease, unspecified: Secondary | ICD-10-CM | POA: Diagnosis not present

## 2016-03-20 DIAGNOSIS — Z7901 Long term (current) use of anticoagulants: Secondary | ICD-10-CM | POA: Diagnosis not present

## 2016-03-20 DIAGNOSIS — L89614 Pressure ulcer of right heel, stage 4: Secondary | ICD-10-CM | POA: Diagnosis not present

## 2016-03-20 DIAGNOSIS — S72001D Fracture of unspecified part of neck of right femur, subsequent encounter for closed fracture with routine healing: Secondary | ICD-10-CM | POA: Diagnosis not present

## 2016-03-20 DIAGNOSIS — E1142 Type 2 diabetes mellitus with diabetic polyneuropathy: Secondary | ICD-10-CM | POA: Diagnosis not present

## 2016-03-20 DIAGNOSIS — S41112A Laceration without foreign body of left upper arm, initial encounter: Secondary | ICD-10-CM | POA: Diagnosis not present

## 2016-03-20 NOTE — Progress Notes (Signed)
[name], [name] ([id number]) Visit Report for 03/20/2016 Arrival Information Details Patient Name: [name], [name]. Date of Service: 03/20/2016 1:30 PM Medical Record Number: [id number] Patient Account Number: [id number] Date of Birth/Sex: 01/02/1936 (79 y.o. Female) Treating RN: [name] Primary Care Physician: Halina Maidens Other Clinician: Referring Physician: Halina Maidens Treating Physician/Extender: Loistine Chance in Treatment: 15 Visit Information History Since Last Visit Added or deleted any medications: No Patient Arrived: Wheel Chair Any new allergies or adverse reactions: No Arrival Time: 13:44 Had a fall or experienced change in No Accompanied By: spouse and cg activities of daily living that may affect Transfer Assistance: Manual risk of falls: Patient Identification Verified: Yes Signs or symptoms of abuse/neglect since last No Secondary Verification Process Yes visito Completed: Hospitalized since last visit: No Patient Requires Transmission- No Pain Present Now: No Based Precautions: Patient Has Alerts: Yes Patient Alerts: Patient on Blood Thinner Electronic Signature(s) Signed: 03/20/2016 2:27:00 PM By: [name] Entered By: [name] on 03/20/2016 13:44:47 [name] ([id number]) -------------------------------------------------------------------------------- Clinic Level of Care Assessment Details Patient Name: [name] Date of Service: 03/20/2016 1:30 PM Medical Record Number: [id number] Patient Account Number: [id number] Date of Birth/Sex: Mar 24, 1936 (79 y.o. Female) Treating RN: [name] Primary Care Physician: Halina Maidens Other Clinician: Referring Physician: Halina Maidens Treating Physician/Extender: Loistine Chance in Treatment: 15 Clinic Level of Care Assessment Items TOOL 4 Quantity Score []  - Use when only an EandM is performed on FOLLOW-UP visit 0 ASSESSMENTS - Nursing  Assessment / Reassessment X - Reassessment of Co-morbidities (includes updates in patient status) 1 10 X - Reassessment of Adherence to Treatment Plan 1 5 ASSESSMENTS - Wound and Skin Assessment / Reassessment X - Simple Wound Assessment / Reassessment - one wound 1 5 []  - Complex Wound Assessment / Reassessment - multiple wounds 0 []  - Dermatologic / Skin Assessment (not related to wound area) 0 ASSESSMENTS - Focused Assessment []  - Circumferential Edema Measurements - multi extremities 0 []  - Nutritional Assessment / Counseling / Intervention 0 []  - Lower Extremity Assessment (monofilament, tuning fork, pulses) 0 []  - Peripheral Arterial Disease Assessment (using hand held doppler) 0 ASSESSMENTS - Ostomy and/or Continence Assessment and Care []  - Incontinence Assessment and Management 0 []  - Ostomy Care Assessment and Management (repouching, etc.) 0 PROCESS - Coordination of Care X - Simple Patient / Family Education for ongoing care 1 15 []  - Complex (extensive) Patient / Family Education for ongoing care 0 []  - Staff obtains Programmer, systems, Records, Test Results / Process Orders 0 []  - Staff telephones HHA, Nursing Homes / Clarify orders / etc 0 []  - Routine Transfer to another Facility (non-emergent condition) 0 [name], [name] ([id number]) []  - Routine Hospital Admission (non-emergent condition) 0 []  - New Admissions / Biomedical engineer / Ordering NPWT, Apligraf, etc. 0 []  - Emergency Hospital Admission (emergent condition) 0 X - Simple Discharge Coordination 1 10 []  - Complex (extensive) Discharge Coordination 0 PROCESS - Special Needs []  - Pediatric / Minor Patient Management 0 []  - Isolation Patient Management 0 []  - Hearing / Language / Visual special needs 0 []  - Assessment of Community assistance (transportation, D/C planning, etc.) 0 []  - Additional assistance / Altered mentation 0 []  - Support Surface(s) Assessment (bed, cushion, seat, etc.) 0 INTERVENTIONS - Wound  Cleansing / Measurement X - Simple Wound Cleansing - one wound 1 5 []  - Complex Wound Cleansing - multiple wounds 0 X - Wound Imaging (photographs - any number of wounds) 1 5 []  -  Wound Tracing (instead of photographs) 0 X - Simple Wound Measurement - one wound 1 5 []  - Complex Wound Measurement - multiple wounds 0 INTERVENTIONS - Wound Dressings X - Small Wound Dressing one or multiple wounds 1 10 []  - Medium Wound Dressing one or multiple wounds 0 []  - Large Wound Dressing one or multiple wounds 0 []  - Application of Medications - topical 0 []  - Application of Medications - injection 0 INTERVENTIONS - Miscellaneous []  - External ear exam 0 [name], [name] ([id number]) []  - Specimen Collection (cultures, biopsies, blood, body fluids, etc.) 0 []  - Specimen(s) / Culture(s) sent or taken to Lab for analysis 0 []  - Patient Transfer (multiple staff / Harrel Lemon Lift / Similar devices) 0 []  - Simple Staple / Suture removal (25 or less) 0 []  - Complex Staple / Suture removal (26 or more) 0 []  - Hypo / Hyperglycemic Management (close monitor of Blood Glucose) 0 []  - Ankle / Brachial Index (ABI) - do not check if billed separately 0 X - Vital Signs 1 5 Has the patient been seen at the hospital within the last three years: Yes Total Score: 75 Level Of Care: New/Established - Level 2 Electronic Signature(s) Signed: 03/20/2016 2:27:00 PM By: [name] Entered By: [name] on 03/20/2016 14:05:29 [name] ([id number]) -------------------------------------------------------------------------------- Encounter Discharge Information Details Patient Name: [name] Date of Service: 03/20/2016 1:30 PM Medical Record Number: [id number] Patient Account Number: [id number] Date of Birth/Sex: Jan 21, 1936 (79 y.o. Female) Treating RN: [name] Primary Care Physician: Halina Maidens Other Clinician: Referring Physician: Halina Maidens Treating Physician/Extender: Loistine Chance in Treatment: 15 Encounter Discharge Information Items Discharge Pain Level: 0 Discharge Condition: Stable Ambulatory Status: Wheelchair Discharge Destination: Home Transportation: Private Auto Accompanied By: spouse and cg Schedule Follow-up Appointment: Yes Medication Reconciliation completed and provided to Patient/Care No Makai Dumond: Provided on Clinical Summary of Care: 03/20/2016 Form Type Recipient Paper Patient LK Electronic Signature(s) Signed: 03/20/2016 2:15:44 PM By: Ruthine Dose Entered By: Ruthine Dose on 03/20/2016 14:15:44 [name] ([id number]) -------------------------------------------------------------------------------- Multi Wound Chart Details Patient Name: [name] Date of Service: 03/20/2016 1:30 PM Medical Record Number: [id number] Patient Account Number: [id number] Date of Birth/Sex: 10/05/1935 (79 y.o. Female) Treating RN: [name] Primary Care Physician: Halina Maidens Other Clinician: Referring Physician: Halina Maidens Treating Physician/Extender: Loistine Chance in Treatment: 15 Vital Signs Height(in): 64 Pulse(bpm): 56 Weight(lbs): 110 Blood Pressure 128/55 (mmHg): Body Mass Index(BMI): 19 Temperature(F): 97.4 Respiratory Rate 16 (breaths/min): Photos: [N/A:N/A] Wound Location: Left Forearm - Anterior N/A N/A Wounding Event: Trauma N/A N/A Primary Etiology: Trauma, Other N/A N/A Comorbid History: Chronic Obstructive N/A N/A Pulmonary Disease (COPD), Angina, Congestive Heart Failure, Type II Diabetes, Lupus Erythematosus, Osteoarthritis, Dementia Date Acquired: 02/24/2016 N/A N/A Weeks of Treatment: 3 N/A N/A Wound Status: Open N/A N/A Measurements L x W x D 2.7x2.5x0.1 N/A N/A (cm) Area (cm) : 5.301 N/A N/A Volume (cm) : 0.53 N/A N/A % Reduction in Area: 22.90% N/A N/A % Reduction in Volume: 22.90% N/A N/A Classification: Partial Thickness N/A N/A Exudate Amount: Large N/A  N/A Exudate Type: Serosanguineous N/A N/A Exudate Color: red, brown N/A N/A [name], [name] ([id number]) Wound Margin: Distinct, outline attached N/A N/A Granulation Amount: Large (67-100%) N/A N/A Granulation Quality: Red N/A N/A Necrotic Amount: Small (1-33%) N/A N/A Necrotic Tissue: Eschar N/A N/A Epithelialization: None N/A N/A Periwound Skin Texture: No Abnormalities Noted N/A N/A Periwound Skin Moist: Yes N/A N/A Moisture: Periwound Skin Color: No Abnormalities Noted  N/A N/A Temperature: No Abnormality N/A N/A Tenderness on Yes N/A N/A Palpation: Wound Preparation: Ulcer Cleansing: N/A N/A Rinsed/Irrigated with Saline Topical Anesthetic Applied: Other: lidocaine 4% Treatment Notes Electronic Signature(s) Signed: 03/20/2016 2:27:00 PM By: [name] Entered By: [name] on 03/20/2016 13:52:27 [name] ([id number]) -------------------------------------------------------------------------------- [place] Details Patient Name: [name] Date of Service: 03/20/2016 1:30 PM Medical Record Number: [id number] Patient Account Number: [id number] Date of Birth/Sex: [birth year] (79 y.o. Female) Treating RN: [name] Primary Care Physician: Halina Maidens Other Clinician: Referring Physician: Halina Maidens Treating Physician/Extender: Loistine Chance in Treatment: 15 Active Inactive Abuse / Safety / Falls / Self Care Management Nursing Diagnoses: Potential for falls Goals: Patient/caregiver will verbalize understanding of skin care regimen Date Initiated: 12/02/2015 Goal Status: Active Patient/caregiver will verbalize/demonstrate measures taken to prevent injury and/or falls Date Initiated: 12/02/2015 Goal Status: Active Interventions: Assess fall risk on admission and as needed Assess: immobility, friction, shearing, incontinence upon admission and as needed Assess impairment of mobility on admission and as  needed per policy Assess self care needs on admission and as needed Provide education on fall prevention Provide education on safe transfers Notes: Wound/Skin Impairment Nursing Diagnoses: Impaired tissue integrity Goals: Patient/caregiver will verbalize understanding of skin care regimen Date Initiated: 12/02/2015 Goal Status: Active Interventions: Assess patient/caregiver ability to obtain necessary supplies Assess patient/caregiver ability to perform ulcer/skin care regimen upon admission and as needed [name], [name] ([id number]) Assess ulceration(s) every visit Provide education on ulcer and skin care Notes: Electronic Signature(s) Signed: 03/20/2016 2:27:00 PM By: [name] Entered By: [name] on 03/20/2016 13:52:18 [name] ([id number]) -------------------------------------------------------------------------------- Pain Assessment Details Patient Name: [name] Date of Service: 03/20/2016 1:30 PM Medical Record Number: [id number] Patient Account Number: [id number] Date of Birth/Sex: 1936-05-21 (79 y.o. Female) Treating RN: [name] Primary Care Physician: Halina Maidens Other Clinician: Referring Physician: Halina Maidens Treating Physician/Extender: Loistine Chance in Treatment: 15 Active Problems Location of Pain Severity and Description of Pain Patient Has Paino No Site Locations Pain Management and Medication Current Pain Management: Notes Topical or injectable lidocaine is offered to patient for acute pain when surgical debridement is performed. If needed, Patient is instructed to use over the counter pain medication for the following 24-48 hours after debridement. Wound care MDs do not prescribed pain medications. Patient has chronic pain or uncontrolled pain. Patient has been instructed to make an appointment with their Primary Care Physician for pain management. Electronic Signature(s) Signed: 03/20/2016 2:27:00  PM By: [name] Entered By: [name] on 03/20/2016 13:44:54 [name] ([id number]) -------------------------------------------------------------------------------- Patient/Caregiver Education Details Patient Name: [name] Date of Service: 03/20/2016 1:30 PM Medical Record Number: [id number] Patient Account Number: [id number] Date of Birth/Gender: Dec 21, 1935 (80 y.o. Female) Treating RN: [name] Primary Care Physician: Halina Maidens Other Clinician: Referring Physician: Halina Maidens Treating Physician/Extender: Loistine Chance in Treatment: 15 Education Assessment Education Provided To: Patient and Caregiver Education Topics Provided Wound/Skin Impairment: Handouts: Other: wound care as ordered Methods: Demonstration, Explain/Verbal Responses: State content correctly Electronic Signature(s) Signed: 03/20/2016 2:27:00 PM By: [name] Entered By: [name] on 03/20/2016 14:15:22 [name] ([id number]) -------------------------------------------------------------------------------- Wound Assessment Details Patient Name: [name] Date of Service: 03/20/2016 1:30 PM Medical Record Number: [id number] Patient Account Number: [id number] Date of Birth/Sex: 04/29/1936 (79 y.o. Female) Treating RN: [name] Primary Care Physician: Halina Maidens Other Clinician: Referring Physician: Halina Maidens Treating Physician/Extender: Loistine Chance in Treatment: 15 Wound Status Wound  Number: 7 Primary Trauma, Other Etiology: Wound Location: Left Forearm - Anterior Wound Open Wounding Event: Trauma Status: Date Acquired: 02/24/2016 Comorbid Chronic Obstructive Pulmonary Disease Weeks Of Treatment: 3 History: (COPD), Angina, Congestive Heart Clustered Wound: No Failure, Type II Diabetes, Lupus Erythematosus, Osteoarthritis, Dementia Photos Wound Measurements Length: (cm) 2.7 Width: (cm) 2.5 Depth:  (cm) 0.1 Area: (cm) 5.301 Volume: (cm) 0.53 % Reduction in Area: 22.9% % Reduction in Volume: 22.9% Epithelialization: None Tunneling: No Undermining: No Wound Description Classification: Partial Thickness Wound Margin: Distinct, outline attached Exudate Amount: Large Exudate Type: Serosanguineous Exudate Color: red, brown Foul Odor After Cleansing: No Wound Bed Granulation Amount: Large (67-100%) Granulation Quality: Red Necrotic Amount: Small (1-33%) Necrotic Quality: [name], [name] ([id number]) Periwound Skin Texture Texture Color No Abnormalities Noted: No No Abnormalities Noted: No Moisture Temperature / Pain No Abnormalities Noted: No Temperature: No Abnormality Moist: Yes Tenderness on Palpation: Yes Wound Preparation Ulcer Cleansing: Rinsed/Irrigated with Saline Topical Anesthetic Applied: Other: lidocaine 4%, Treatment Notes Wound #7 (Left, Anterior Forearm) 1. Cleansed with: Clean wound with Normal Saline 2. Anesthetic Topical Lidocaine 4% cream to wound bed prior to debridement 4. Dressing Applied: Prisma Ag 5. Secondary Dressing Applied Non-Adherent pad Gauze and Kerlix/Conform 7. Secured with Tape Notes coban lightly to secure Electronic Signature(s) Signed: 03/20/2016 2:27:00 PM By: [name] Entered By: [name] on 03/20/2016 13:52:03 [name] ([id number]) -------------------------------------------------------------------------------- [place] Details Patient Name: [name] Date of Service: 03/20/2016 1:30 PM Medical Record Number: [id number] Patient Account Number: [id number] Date of Birth/Sex: 12-31-1935 (79 y.o. Female) Treating RN: [name] Primary Care Physician: Halina Maidens Other Clinician: Referring Physician: Halina Maidens Treating Physician/Extender: Loistine Chance in Treatment: 15 Vital Signs Time Taken: 13:44 Temperature (F): 97.4 Height (in): 64 Pulse (bpm):  56 Weight (lbs): 110 Respiratory Rate (breaths/min): 16 Body Mass Index (BMI): 18.9 Blood Pressure (mmHg): 128/55 Reference Range: 80 - 120 mg / dl Electronic Signature(s) Signed: 03/20/2016 2:27:00 PM By: [name] Entered By: [name] on 03/20/2016 13:45:52

## 2016-03-20 NOTE — Progress Notes (Signed)
Madison, [name] ([id number]) Visit Report for 03/20/2016 Chief Complaint Document Details Patient Name: [name], [name] 03/20/2016 1:30 Date of Service: PM Medical Record [id number] Number: Patient Account Number: [id number] 1935/12/28 (80 y.o. Treating RN: [name] Date of Birth/Sex: Female) Other Clinician: Primary Care Physician: Halina Maidens Treating Londell Moh Referring Physician: Halina Maidens Physician/Extender: Suella Grove in Treatment: 15 Information Obtained from: Patient Chief Complaint Patient is at the clinic for treatment of an open pressure ulcer to her right calcaneum which has been there for about 4 weeks Electronic Signature(s) Signed: 03/20/2016 4:05:49 PM By: Londell Moh FNP Entered By: Londell Moh on 03/20/2016 14:12:13 [name] ([id number]) -------------------------------------------------------------------------------- HPI Details Patient Name: Madison, [name] 03/20/2016 1:30 Date of Service: PM Medical Record [id number] Number: Patient Account Number: [id number] July 17, 1935 (80 y.o. Treating RN: [name] Date of Birth/Sex: Female) Other Clinician: Primary Care Physician: Halina Maidens Treating Londell Moh Referring Physician: Halina Maidens Physician/Extender: Suella Grove in Treatment: 15 History of Present Illness Location: right posterior heel, and both lower extremities Quality: Patient reports experiencing a sharp pain to affected area(s). Severity: Patient states wound (s) are getting better. Duration: Patient has had the wound for < 4 weeks prior to presenting for treatment Timing: Pain in wound is Intermittent (comes and goes Context: The wound appeared gradually over time while she was in hospital with a right hip fracture Modifying Factors: Consults to this date include:was seen by her PCP and put on some antibiotics a while ago Associated Signs and Symptoms: Patient reports having difficulty  standing for long periods. HPI Description: 80 year old patient referred who was recently discharged in April of this year with a left heel ulcer now comes with a right heel ulcer and some ulcerations in both lower extremities which have all come along during her recent hospitalization which she had a right hip fracture. She has a past medical history of COPD, diabetes mellitus type 2 with chronic kidney disease, multi-infarct dementia, anxiety, urinary retention, paroxysmal atrial fibrillation. She has also had moderate malnutrition, multi-infarct dementia, congestive heart failure, left displaced femoral neck fracture, systemic lupus erythematosus. She is also status total abdominal hysterectomy with bilateral salpingo-oophorectomy, EGDs, hip arthroplasty on the left, cataract surgery. She has been a from a smoker and quit in May 2013 and she smoked for about 40 years. recently admitted to [facility] between January 19 and 07/03/2015 12/09/2015 -- o right foot x-ray -- IMPRESSION:Soft tissue ulceration of the heel. No underlying erosive bony abnormality. Diffuse degenerative change. No acute bony abnormality. 12/30/2015-- admitted to the hospital between July 12 and July 18 of 2017 and was treated for cerebral infarction, infected decubitus ulcer and aspiration pneumonia of the right lung. MRI was negative for stroke and was thought to be secondary to pneumonia. She was treated with clindamycin. Patient was discharged home on Keflex and Zyvox. Dr. Lucky Cowboy took her for an aortogram and a right lower extremity runoff and found to 30% stenosis in the mid to distal SFA but no significant stenosis in the popliteal artery and the SFA. There was a two-vessel runoff through the anterior tibial artery and peroneal artery without significant stenosis. felt her perfusion was adequate for wound healing. 02/28/2016 -- days ago she had a lacerated wound on her left forearm which they have  been treating locally and have asked me to take a look today. 03/13/16: returns today for f/u. the right posterior heel wound has achieved epithelialization. the left forearm wound is healing without issue. no systemic s/s  of infection. Madison, [name] ([id number]) 03/20/16: returns today for f/u of a left forearm wound. husband reports dressing slides down. no reports of systemic s/s of infection. no new wounds or skin breakdown. Electronic Signature(s) Signed: 03/20/2016 4:05:49 PM By: Londell Moh FNP Entered By: Londell Moh on 03/20/2016 14:12:57 [name] ([id number]) -------------------------------------------------------------------------------- Physical Exam Details Patient Name: Madison, [name] 03/20/2016 1:30 Date of Service: PM Medical Record [id number] Number: Patient Account Number: [id number] 05-22-1936 (80 y.o. Treating RN: [name] Date of Birth/Sex: Female) Other Clinician: Primary Care Physician: Halina Maidens Treating Londell Moh Referring Physician: Halina Maidens Physician/Extender: Suella Grove in Treatment: 15 Constitutional Patient's appearance is neat and clean. Appears in no acute distress. Well nourished and well developed.. Ears, Nose, Mouth, and Throat Patient can hear normal speaking tones without difficulty.Marland Kitchen Respiratory Respiratory effort is easy and symmetric bilaterally. Rate is normal at rest and on room air.Marland Kitchen Psychiatric Judgement and insight intact.. Alert and oriented times 3.. Short and long term memory intact.. No evidence of depression, anxiety, or agitation. Calm, cooperative, and communicative. Appropriate interactions and affect.. Electronic Signature(s) Signed: 03/20/2016 4:05:49 PM By: Londell Moh FNP Entered By: Londell Moh on 03/20/2016 14:13:19 [name] ([id number]) -------------------------------------------------------------------------------- Physician Orders Details Patient Name:  Madison, [name] 03/20/2016 1:30 Date of Service: PM Medical Record [id number] Number: Patient Account Number: [id number] 30-Nov-1935 (80 y.o. Treating RN: [name] Date of Birth/Sex: Female) Other Clinician: Primary Care Physician: Halina Maidens Treating Londell Moh Referring Physician: Halina Maidens Physician/Extender: Suella Grove in Treatment: 15 Verbal / Phone Orders: Yes Clinician: [name] Read Back and Verified: Yes Diagnosis Coding Wound Cleansing Wound #7 Left,Anterior Forearm o Clean wound with Normal Saline. - in clinic o Cleanse wound with mild soap and water Anesthetic Wound #7 Left,Anterior Forearm o Topical Lidocaine 4% cream applied to wound bed prior to debridement Primary Wound Dressing Wound #7 Left,Anterior Forearm o Prisma Ag Secondary Dressing Wound #7 Left,Anterior Forearm o Gauze and Kerlix/Conform - secure lightly with coban Dressing Change Frequency Wound #7 Left,Anterior Forearm o Other: - twice weekly Follow-up Appointments Wound #7 Left,Anterior Forearm o Return Appointment in 1 week. Off-Loading o Other: - float heels with pillow under calves when in bed; wear pressure relieving boots while in bed Additional Orders / Instructions Wound #7 Left,Anterior Forearm o Increase protein intake. o Activity as tolerated - VItamin C, A, ZINC, MVI CELINE, LYSTER ([id number]) Home Health Wound #7 Left,Anterior Forearm o [place] Visits - [place] Nurse may visit PRN to address patientos wound care needs. o FACE TO FACE ENCOUNTER: MEDICARE and MEDICAID PATIENTS: I certify that this patient is under my care and that I had a face-to-face encounter that meets the physician face-to-face encounter requirements with this patient on this date. The encounter with the patient was in whole or in part for the following MEDICAL CONDITION: (primary reason for [place]) MEDICAL NECESSITY: I  certify, that based on my findings, NURSING services are a medically necessary home health service. HOME BOUND STATUS: I certify that my clinical findings support that this patient is homebound (i.e., Due to illness or injury, pt requires aid of supportive devices such as crutches, cane, wheelchairs, walkers, the use of special transportation or the assistance of another person to leave their place of residence. There is a normal inability to leave the home and doing so requires considerable and taxing effort. Other absences are for medical reasons / religious services and are infrequent or of short duration when for  other reasons). o If current dressing causes regression in wound condition, may D/C ordered dressing product/s and apply Normal Saline Moist Dressing daily until next [place] / Other MD appointment. [place] of regression in wound condition at [phone]. o Please direct any NON-WOUND related issues/requests for orders to patient's Primary Care Physician Electronic Signature(s) Signed: 03/20/2016 2:27:00 PM By: [name] Signed: 03/20/2016 4:05:49 PM By: Londell Moh FNP Entered By: [name] on 03/20/2016 14:04:58 [name] ([id number]) -------------------------------------------------------------------------------- Problem List Details Patient Name: JEANNETTE, DEFAZIO 03/20/2016 1:30 Date of Service: PM Medical Record [id number] Number: Patient Account Number: [id number] 10/29/1935 (80 y.o. Treating RN: [name] Date of Birth/Sex: Female) Other Clinician: Primary Care Physician: Halina Maidens Treating Londell Moh Referring Physician: Halina Maidens Physician/Extender: Suella Grove in Treatment: 15 Active Problems ICD-10 Encounter Code Description Active Date Diagnosis E11.621 Type 2 diabetes mellitus with foot ulcer 12/02/2015 Yes L89.613 Pressure ulcer of right heel, stage 3 12/02/2015 Yes Z79.01 Long  term (current) use of anticoagulants 12/02/2015 Yes S41.112A Laceration without foreign body of left upper arm, initial 02/28/2016 Yes encounter S41.102A Unspecified open wound of left upper arm, initial 02/28/2016 Yes encounter Inactive Problems Resolved Problems ICD-10 Code Description Active Date Resolved Date L97.212 Non-pressure chronic ulcer of right calf with fat layer 12/02/2015 12/02/2015 exposed L97.211 Non-pressure chronic ulcer of right calf limited to 12/02/2015 12/02/2015 breakdown of skin URIANA, BUECHE ([id number]) Electronic Signature(s) Signed: 03/20/2016 4:05:49 PM By: Londell Moh FNP Entered By: Londell Moh on 03/20/2016 14:12:04 [name] ([id number]) -------------------------------------------------------------------------------- Progress Note Details Patient Name: [name] 03/20/2016 1:30 Date of Service: PM Medical Record [id number] Number: Patient Account Number: [id number] [birth year] (80 y.o. Treating RN: [name] Date of Birth/Sex: Female) Other Clinician: Primary Care Physician: Halina Maidens Treating Londell Moh Referring Physician: Halina Maidens Physician/Extender: Suella Grove in Treatment: 15 Subjective Chief Complaint Information obtained from Patient Patient is at the clinic for treatment of an open pressure ulcer to her right calcaneum which has been there for about 4 weeks History of Present Illness (HPI) The following HPI elements were documented for the patient's wound: Location: right posterior heel, and both lower extremities Quality: Patient reports experiencing a sharp pain to affected area(s). Severity: Patient states wound (s) are getting better. Duration: Patient has had the wound for < 4 weeks prior to presenting for treatment Timing: Pain in wound is Intermittent (comes and goes Context: The wound appeared gradually over time while she was in hospital with a right hip fracture Modifying Factors:  Consults to this date include:was seen by her PCP and put on some antibiotics a while ago Associated Signs and Symptoms: Patient reports having difficulty standing for long periods. 80 year old patient referred who was recently discharged in April of this year with a left heel ulcer now comes with a right heel ulcer and some ulcerations in both lower extremities which have all come along during her recent hospitalization which she had a right hip fracture. She has a past medical history of COPD, diabetes mellitus type 2 with chronic kidney disease, multi-infarct dementia, anxiety, urinary retention, paroxysmal atrial fibrillation. She has also had moderate malnutrition, multi-infarct dementia, congestive heart failure, left displaced femoral neck fracture, systemic lupus erythematosus. She is also status total abdominal hysterectomy with bilateral salpingo-oophorectomy, EGDs, hip arthroplasty on the left, cataract surgery. She has been a from a smoker and quit in May 2013 and she smoked for about 40 years. recently admitted to [facility] between January 19  and 07/03/2015 12/09/2015 -- right foot x-ray -- IMPRESSION:Soft tissue ulceration of the heel. No underlying erosive bony abnormality. Diffuse degenerative change. No acute bony abnormality. 12/30/2015-- admitted to the hospital between July 12 and July 18 of 2017 and was treated for cerebral infarction, infected decubitus ulcer and aspiration pneumonia of the right lung. MRI was negative for stroke and was thought to be secondary to pneumonia. She was treated with clindamycin. Patient was discharged home on Keflex and Zyvox. [name] ([id number]) Dr. Lucky Cowboy took her for an aortogram and a right lower extremity runoff and found to 30% stenosis in the mid to distal SFA but no significant stenosis in the popliteal artery and the SFA. There was a two-vessel runoff through the anterior tibial artery and peroneal artery  without significant stenosis. felt her perfusion was adequate for wound healing. 02/28/2016 -- days ago she had a lacerated wound on her left forearm which they have been treating locally and have asked me to take a look today. 03/13/16: returns today for f/u. the right posterior heel wound has achieved epithelialization. the left forearm wound is healing without issue. no systemic s/s of infection. 03/20/16: returns today for f/u of a left forearm wound. husband reports dressing slides down. no reports of systemic s/s of infection. no new wounds or skin breakdown. Objective Constitutional Patient's appearance is neat and clean. Appears in no acute distress. Well nourished and well developed.. Vitals Time Taken: 1:44 PM, Height: 64 in, Weight: 110 lbs, BMI: 18.9, Temperature: 97.4 F, Pulse: 56 bpm, Respiratory Rate: 16 breaths/min, Blood Pressure: 128/55 mmHg. Ears, Nose, Mouth, and Throat Patient can hear normal speaking tones without difficulty.Marland Kitchen Respiratory Respiratory effort is easy and symmetric bilaterally. Rate is normal at rest and on room air.Marland Kitchen Psychiatric Judgement and insight intact.. Alert and oriented times 3.. Short and long term memory intact.. No evidence of depression, anxiety, or agitation. Calm, cooperative, and communicative. Appropriate interactions and affect.. Integumentary (Hair, Skin) Wound #7 status is Open. Original cause of wound was Trauma. The wound is located on the Left,Anterior Forearm. The wound measures 2.7cm length x 2.5cm width x 0.1cm depth; 5.301cm^2 area and 0.53cm^3 volume. There is no tunneling or undermining noted. There is a large amount of serosanguineous drainage noted. The wound margin is distinct with the outline attached to the wound base. There is large (67-100%) red granulation within the wound bed. There is a small (1-33%) amount of necrotic tissue within the wound bed including Eschar. The periwound skin appearance exhibited: Moist.  Periwound temperature was noted as No Abnormality. The periwound has tenderness on palpation. SHERETTA, SURRETT ([id number]) Assessment Active Problems ICD-10 E11.621 - Type 2 diabetes mellitus with foot ulcer L89.613 - Pressure ulcer of right heel, stage 3 Z79.01 - Long term (current) use of anticoagulants S41.112A - Laceration without foreign body of left upper arm, initial encounter S41.102A - Unspecified open wound of left upper arm, initial encounter Plan Wound Cleansing: Wound #7 Left,Anterior Forearm: Clean wound with Normal Saline. - in clinic Cleanse wound with mild soap and water Anesthetic: Wound #7 Left,Anterior Forearm: Topical Lidocaine 4% cream applied to wound bed prior to debridement Primary Wound Dressing: Wound #7 Left,Anterior Forearm: Prisma Ag Secondary Dressing: Wound #7 Left,Anterior Forearm: Gauze and Kerlix/Conform - secure lightly with coban Dressing Change Frequency: Wound #7 Left,Anterior Forearm: Other: - twice weekly Follow-up Appointments: Wound #7 Left,Anterior Forearm: Return Appointment in 1 week. Off-Loading: Other: - float heels with pillow under calves when in bed; wear pressure relieving  boots while in bed Additional Orders / Instructions: Wound #7 Left,Anterior Forearm: Increase protein intake. Activity as tolerated - VItamin C, A, ZINC, MVI Home Health: Wound #7 Left,Anterior Forearm: Continue Home Health Visits - [place] Nurse may visit PRN to address patient s wound care needs. FACE TO FACE ENCOUNTER: MEDICARE and MEDICAID PATIENTS: I certify that this patient is under CORRIN, HAYHURST ([id number]) my care and that I had a face-to-face encounter that meets the physician face-to-face encounter requirements with this patient on this date. The encounter with the patient was in whole or in part for the following MEDICAL CONDITION: (primary reason for [place]) MEDICAL NECESSITY: I certify, that based on my  findings, NURSING services are a medically necessary home health service. HOME BOUND STATUS: I certify that my clinical findings support that this patient is homebound (i.e., Due to illness or injury, pt requires aid of supportive devices such as crutches, cane, wheelchairs, walkers, the use of special transportation or the assistance of another person to leave their place of residence. There is a normal inability to leave the home and doing so requires considerable and taxing effort. Other absences are for medical reasons / religious services and are infrequent or of short duration when for other reasons). If current dressing causes regression in wound condition, may D/C ordered dressing product/s and apply Normal Saline Moist Dressing daily until next [place] / Other MD appointment. [place] of regression in wound condition at [phone]. Please direct any NON-WOUND related issues/requests for orders to patient's Primary Care Physician 1. there is new epithelialized skin encrouching into the wound bed. the wound is clean with red granulation tissue. there is no evidence for infection. 2. d/c previous wound care orders. Prisma twice weekly and prn. we will utilized coban to secure dressing in place. hopefully this will maintain dressing placement. 3. see orders above. 4. discussed clinical findings and implications with pt and husband. all questions were answered. 5. discussed rationale for changing wound dressing modality today. Electronic Signature(s) Signed: 03/20/2016 4:05:49 PM By: Londell Moh FNP Entered By: Londell Moh on 03/20/2016 14:15:31 [name] ([id number]) -------------------------------------------------------------------------------- [place] Details Patient Name: [name] Date of Service: 03/20/2016 Medical Record Number: [id number] Patient Account Number: [id number] Date of Birth/Sex: 08-25-1935 (80 y.o.  Female) Treating RN: [name] Primary Care Physician: Halina Maidens Other Clinician: Referring Physician: Halina Maidens Treating Physician/Extender: Loistine Chance in Treatment: 15 Diagnosis Coding ICD-10 Codes Code Description E11.621 Type 2 diabetes mellitus with foot ulcer L89.613 Pressure ulcer of right heel, stage 3 Z79.01 Long term (current) use of anticoagulants S41.112A Laceration without foreign body of left upper arm, initial encounter S41.102A Unspecified open wound of left upper arm, initial encounter Facility Procedures CPT4 Code: [id number] Description: [id number] - WOUND CARE VISIT-LEV 2 EST PT Modifier: Quantity: 1 Physician Procedures CPT4 Code Description: [id number] - WC PHYS LEVEL 3 - EST PT ICD-10 Description Diagnosis S41.112A Laceration without foreign body of left upper arm, S41.102A Unspecified open wound of left upper arm, initial e Modifier: initial encount ncounter Quantity: 1 er Engineer, maintenance) Signed: 03/20/2016 4:05:49 PM By: Londell Moh FNP Entered By: Londell Moh on 03/20/2016 14:15:57

## 2016-03-21 DIAGNOSIS — E1142 Type 2 diabetes mellitus with diabetic polyneuropathy: Secondary | ICD-10-CM | POA: Diagnosis not present

## 2016-03-21 DIAGNOSIS — S72001D Fracture of unspecified part of neck of right femur, subsequent encounter for closed fracture with routine healing: Secondary | ICD-10-CM | POA: Diagnosis not present

## 2016-03-21 DIAGNOSIS — F0390 Unspecified dementia without behavioral disturbance: Secondary | ICD-10-CM | POA: Diagnosis not present

## 2016-03-21 DIAGNOSIS — J449 Chronic obstructive pulmonary disease, unspecified: Secondary | ICD-10-CM | POA: Diagnosis not present

## 2016-03-21 DIAGNOSIS — L89614 Pressure ulcer of right heel, stage 4: Secondary | ICD-10-CM | POA: Diagnosis not present

## 2016-03-21 DIAGNOSIS — I5032 Chronic diastolic (congestive) heart failure: Secondary | ICD-10-CM | POA: Diagnosis not present

## 2016-03-22 DIAGNOSIS — F0390 Unspecified dementia without behavioral disturbance: Secondary | ICD-10-CM | POA: Diagnosis not present

## 2016-03-22 DIAGNOSIS — S72001D Fracture of unspecified part of neck of right femur, subsequent encounter for closed fracture with routine healing: Secondary | ICD-10-CM | POA: Diagnosis not present

## 2016-03-22 DIAGNOSIS — J449 Chronic obstructive pulmonary disease, unspecified: Secondary | ICD-10-CM | POA: Diagnosis not present

## 2016-03-22 DIAGNOSIS — L89614 Pressure ulcer of right heel, stage 4: Secondary | ICD-10-CM | POA: Diagnosis not present

## 2016-03-22 DIAGNOSIS — E1142 Type 2 diabetes mellitus with diabetic polyneuropathy: Secondary | ICD-10-CM | POA: Diagnosis not present

## 2016-03-22 DIAGNOSIS — I5032 Chronic diastolic (congestive) heart failure: Secondary | ICD-10-CM | POA: Diagnosis not present

## 2016-03-23 DIAGNOSIS — L89614 Pressure ulcer of right heel, stage 4: Secondary | ICD-10-CM | POA: Diagnosis not present

## 2016-03-23 DIAGNOSIS — J449 Chronic obstructive pulmonary disease, unspecified: Secondary | ICD-10-CM | POA: Diagnosis not present

## 2016-03-23 DIAGNOSIS — E1142 Type 2 diabetes mellitus with diabetic polyneuropathy: Secondary | ICD-10-CM | POA: Diagnosis not present

## 2016-03-23 DIAGNOSIS — F0390 Unspecified dementia without behavioral disturbance: Secondary | ICD-10-CM | POA: Diagnosis not present

## 2016-03-23 DIAGNOSIS — I5032 Chronic diastolic (congestive) heart failure: Secondary | ICD-10-CM | POA: Diagnosis not present

## 2016-03-23 DIAGNOSIS — S72001D Fracture of unspecified part of neck of right femur, subsequent encounter for closed fracture with routine healing: Secondary | ICD-10-CM | POA: Diagnosis not present

## 2016-03-24 DIAGNOSIS — I5032 Chronic diastolic (congestive) heart failure: Secondary | ICD-10-CM | POA: Diagnosis not present

## 2016-03-24 DIAGNOSIS — E1142 Type 2 diabetes mellitus with diabetic polyneuropathy: Secondary | ICD-10-CM | POA: Diagnosis not present

## 2016-03-24 DIAGNOSIS — S72001D Fracture of unspecified part of neck of right femur, subsequent encounter for closed fracture with routine healing: Secondary | ICD-10-CM | POA: Diagnosis not present

## 2016-03-24 DIAGNOSIS — F0390 Unspecified dementia without behavioral disturbance: Secondary | ICD-10-CM | POA: Diagnosis not present

## 2016-03-24 DIAGNOSIS — L89614 Pressure ulcer of right heel, stage 4: Secondary | ICD-10-CM | POA: Diagnosis not present

## 2016-03-24 DIAGNOSIS — J449 Chronic obstructive pulmonary disease, unspecified: Secondary | ICD-10-CM | POA: Diagnosis not present

## 2016-03-26 DIAGNOSIS — Z87891 Personal history of nicotine dependence: Secondary | ICD-10-CM | POA: Diagnosis not present

## 2016-03-26 DIAGNOSIS — I480 Paroxysmal atrial fibrillation: Secondary | ICD-10-CM | POA: Diagnosis not present

## 2016-03-26 DIAGNOSIS — E1142 Type 2 diabetes mellitus with diabetic polyneuropathy: Secondary | ICD-10-CM | POA: Diagnosis not present

## 2016-03-26 DIAGNOSIS — L89614 Pressure ulcer of right heel, stage 4: Secondary | ICD-10-CM | POA: Diagnosis not present

## 2016-03-26 DIAGNOSIS — L89893 Pressure ulcer of other site, stage 3: Secondary | ICD-10-CM | POA: Diagnosis not present

## 2016-03-26 DIAGNOSIS — S50811D Abrasion of right forearm, subsequent encounter: Secondary | ICD-10-CM | POA: Diagnosis not present

## 2016-03-26 DIAGNOSIS — I5032 Chronic diastolic (congestive) heart failure: Secondary | ICD-10-CM | POA: Diagnosis not present

## 2016-03-26 DIAGNOSIS — R296 Repeated falls: Secondary | ICD-10-CM | POA: Diagnosis not present

## 2016-03-26 DIAGNOSIS — Z9981 Dependence on supplemental oxygen: Secondary | ICD-10-CM | POA: Diagnosis not present

## 2016-03-26 DIAGNOSIS — Z7984 Long term (current) use of oral hypoglycemic drugs: Secondary | ICD-10-CM | POA: Diagnosis not present

## 2016-03-26 DIAGNOSIS — F419 Anxiety disorder, unspecified: Secondary | ICD-10-CM | POA: Diagnosis not present

## 2016-03-26 DIAGNOSIS — J449 Chronic obstructive pulmonary disease, unspecified: Secondary | ICD-10-CM | POA: Diagnosis not present

## 2016-03-26 DIAGNOSIS — M329 Systemic lupus erythematosus, unspecified: Secondary | ICD-10-CM | POA: Diagnosis not present

## 2016-03-26 DIAGNOSIS — F0390 Unspecified dementia without behavioral disturbance: Secondary | ICD-10-CM | POA: Diagnosis not present

## 2016-03-26 DIAGNOSIS — S50812D Abrasion of left forearm, subsequent encounter: Secondary | ICD-10-CM | POA: Diagnosis not present

## 2016-03-27 ENCOUNTER — Encounter (HOSPITAL_BASED_OUTPATIENT_CLINIC_OR_DEPARTMENT_OTHER): Payer: Medicare Other | Admitting: General Surgery

## 2016-03-27 DIAGNOSIS — I639 Cerebral infarction, unspecified: Secondary | ICD-10-CM

## 2016-03-27 DIAGNOSIS — S41102A Unspecified open wound of left upper arm, initial encounter: Secondary | ICD-10-CM | POA: Diagnosis not present

## 2016-03-27 DIAGNOSIS — S41112D Laceration without foreign body of left upper arm, subsequent encounter: Secondary | ICD-10-CM | POA: Diagnosis not present

## 2016-03-27 DIAGNOSIS — E11621 Type 2 diabetes mellitus with foot ulcer: Secondary | ICD-10-CM | POA: Diagnosis not present

## 2016-03-27 DIAGNOSIS — S41112A Laceration without foreign body of left upper arm, initial encounter: Secondary | ICD-10-CM | POA: Diagnosis not present

## 2016-03-27 DIAGNOSIS — L89613 Pressure ulcer of right heel, stage 3: Secondary | ICD-10-CM | POA: Diagnosis not present

## 2016-03-27 DIAGNOSIS — J449 Chronic obstructive pulmonary disease, unspecified: Secondary | ICD-10-CM | POA: Diagnosis not present

## 2016-03-27 DIAGNOSIS — Z7901 Long term (current) use of anticoagulants: Secondary | ICD-10-CM | POA: Diagnosis not present

## 2016-03-27 NOTE — Progress Notes (Signed)
See I heal 

## 2016-03-27 NOTE — Progress Notes (Signed)
[name], [name] ([id number]) Visit Report for 03/27/2016 Chief Complaint Document Details Patient Name: [name], [name] 03/27/2016 1:30 Date of Service: PM Medical Record [id number] Number: Patient Account Number: [id number] 05/04/1936 (80 y.o. Treating RN: Cornell Barman Date of Birth/Sex: Female) Other Clinician: Primary Care Physician: Halina Maidens Treating Jerline Pain, Kinsey Karch Referring Physician: Halina Maidens Physician/Extender: Suella Grove in Treatment: 16 Information Obtained from: Patient Chief Complaint Patient is at the clinic for treatment of an open pressure ulcer to her right calcaneum which has been there for about 4 weeks Electronic Signature(s) Signed: 03/27/2016 2:06:40 PM By: Judene Companion MD Entered By: Judene Companion on 03/27/2016 14:06:40 [name] ([id number]) -------------------------------------------------------------------------------- Debridement Details Patient Name: [name] 03/27/2016 1:30 Date of Service: PM Medical Record [id number] Number: Patient Account Number: [id number] December 04, 1935 (80 y.o. Treating RN: Cornell Barman Date of Birth/Sex: Female) Other Clinician: Primary Care Physician: Halina Maidens Treating Jerline Pain, [place] Referring Physician: Halina Maidens Physician/Extender: Suella Grove in Treatment: 16 Debridement Performed for Wound #7 Left,Anterior Forearm Assessment: Performed By: Physician Judene Companion, MD Debridement: Debridement Pre-procedure Yes - 13:40 Verification/Time Out Taken: Start Time: 13:41 Pain Control: Other : lidocaine 4% Level: Skin/Subcutaneous Tissue Total Area Debrided (L x 3.5 (cm) x 2.5 (cm) = 8.75 (cm) W): Tissue and other Viable, Eschar, Fibrin/Slough, Subcutaneous material debrided: Instrument: Forceps, Scissors Bleeding: Minimum Hemostasis Achieved: Pressure End Time: 13:45 Procedural Pain: 2 Post Procedural Pain: 0 Response to Treatment: Procedure was tolerated well Post Debridement  Measurements of Total Wound Length: (cm) 3.5 Width: (cm) 2.5 Depth: (cm) 0.1 Volume: (cm) 0.687 Character of Wound/Ulcer Post Requires Further Debridement Debridement: Severity of Tissue Post Debridement: Fat layer exposed Post Procedure Diagnosis Same as Pre-procedure Electronic Signature(s) Signed: 03/27/2016 3:40:17 PM By: Judene Companion MD Signed: 03/27/2016 4:23:36 PM By: Gretta Cool RN, BSN, Kim RN, BSN [place], [name] ([id number]) Entered By: Gretta Cool, RN, BSN, Kim on 03/27/2016 13:51:00 [name] ([id number]) -------------------------------------------------------------------------------- HPI Details Patient Name: [name], [name] 03/27/2016 1:30 Date of Service: PM Medical Record [id number] Number: Patient Account Number: [id number] 06-11-1936 (80 y.o. Treating RN: Cornell Barman Date of Birth/Sex: Female) Other Clinician: Primary Care Physician: Halina Maidens Treating Jerline Pain, [place] Referring Physician: Halina Maidens Physician/Extender: Suella Grove in Treatment: 16 History of Present Illness Location: right posterior heel, and both lower extremities Quality: Patient reports experiencing a sharp pain to affected area(s). Severity: Patient states wound (s) are getting better. Duration: Patient has had the wound for < 4 weeks prior to presenting for treatment Timing: Pain in wound is Intermittent (comes and goes Context: The wound appeared gradually over time while she was in hospital with a right hip fracture Modifying Factors: Consults to this date include:was seen by her PCP and put on some antibiotics a while ago Associated Signs and Symptoms: Patient reports having difficulty standing for long periods. HPI Description: 80 year old patient referred who was recently discharged in April of this year with a left heel ulcer now comes with a right heel ulcer and some ulcerations in both lower extremities which have all come along during her recent hospitalization which  she had a right hip fracture. She has a past medical history of COPD, diabetes mellitus type 2 with chronic kidney disease, multi-infarct dementia, anxiety, urinary retention, paroxysmal atrial fibrillation. She has also had moderate malnutrition, multi-infarct dementia, congestive heart failure, left displaced femoral neck fracture, systemic lupus erythematosus. She is also status total abdominal hysterectomy with bilateral salpingo-oophorectomy, EGDs, hip arthroplasty on the left, cataract surgery. She has been a from a smoker and quit  in May 2013 and she smoked for about 40 years. recently admitted to [facility] between January 19 and 07/03/2015 12/09/2015 -- o right foot x-ray -- IMPRESSION:Soft tissue ulceration of the heel. No underlying erosive bony abnormality. Diffuse degenerative change. No acute bony abnormality. 12/30/2015-- admitted to the hospital between July 12 and July 18 of 2017 and was treated for cerebral infarction, infected decubitus ulcer and aspiration pneumonia of the right lung. MRI was negative for stroke and was thought to be secondary to pneumonia. She was treated with clindamycin. Patient was discharged home on Keflex and Zyvox. Dr. Lucky Cowboy took her for an aortogram and a right lower extremity runoff and found to 30% stenosis in the mid to distal SFA but no significant stenosis in the popliteal artery and the SFA. There was a two-vessel runoff through the anterior tibial artery and peroneal artery without significant stenosis. felt her perfusion was adequate for wound healing. 02/28/2016 -- days ago she had a lacerated wound on her left forearm which they have been treating locally and have asked me to take a look today. 03/13/16: returns today for f/u. the right posterior heel wound has achieved epithelialization. the left forearm wound is healing without issue. no systemic s/s of infection. [name], [name] ([id number]) 03/20/16: returns today  for f/u of a left forearm wound. husband reports dressing slides down. no reports of systemic s/s of infection. no new wounds or skin breakdown. Electronic Signature(s) Signed: 03/27/2016 2:07:46 PM By: Judene Companion MD Entered By: Judene Companion on 03/27/2016 14:07:46 [name] ([id number]) -------------------------------------------------------------------------------- Physical Exam Details Patient Name: [name], [name] 03/27/2016 1:30 Date of Service: PM Medical Record [id number] Number: Patient Account Number: [id number] 1935-12-25 (80 y.o. Treating RN: Cornell Barman Date of Birth/Sex: Female) Other Clinician: Primary Care Physician: Manuella Ghazi, Skyy Mcknight Referring Physician: Halina Maidens Physician/Extender: Suella Grove in Treatment: 16 Electronic Signature(s) Signed: 03/27/2016 2:07:54 PM By: Judene Companion MD Entered By: Judene Companion on 03/27/2016 14:07:54 [name] ([id number]) -------------------------------------------------------------------------------- Physician Orders Details Patient Name: [name], [name] 03/27/2016 1:30 Date of Service: PM Medical Record [id number] Number: Patient Account Number: [id number] 12/09/1935 (80 y.o. Treating RN: Cornell Barman Date of Birth/Sex: Female) Other Clinician: Primary Care Physician: Manuella Ghazi, [place] Referring Physician: Halina Maidens Physician/Extender: Suella Grove in Treatment: 16 Verbal / Phone Orders: Yes Clinician: Cornell Barman Read Back and Verified: Yes Diagnosis Coding Wound Cleansing Wound #7 Left,Anterior Forearm o Clean wound with Normal Saline. - in clinic o Cleanse wound with mild soap and water Anesthetic Wound #7 Left,Anterior Forearm o Topical Lidocaine 4% cream applied to wound bed prior to debridement Primary Wound Dressing Wound #7 Left,Anterior Forearm o Mepitel One o Prisma Ag Wound #8 Right Forearm o Mepitel One o Prisma Ag Secondary  Dressing Wound #7 Left,Anterior Forearm o Conform/Kerlix - Stretch net to secure Wound #8 Right Forearm o Conform/Kerlix - Stretch net to secure Dressing Change Frequency Wound #7 Left,Anterior Forearm o Other: - twice weekly Wound #8 Right Forearm o Other: - twice weekly Follow-up Appointments Wound #7 Left,Anterior Forearm [name] ([id number]) o Return Appointment in 1 week. Wound #8 Right Forearm o Return Appointment in 1 week. Additional Orders / Instructions Wound #7 Left,Anterior Forearm o Increase protein intake. o Activity as tolerated - VItamin C, A, ZINC, MVI Home Health Wound #7 Left,Anterior Forearm o [facility] Visits - [place] Nurse may visit PRN to address patientos wound care needs. o FACE TO FACE ENCOUNTER: MEDICARE  and MEDICAID PATIENTS: I certify that this patient is under my care and that I had a face-to-face encounter that meets the physician face-to-face encounter requirements with this patient on this date. The encounter with the patient was in whole or in part for the following MEDICAL CONDITION: (primary reason for [place]) MEDICAL NECESSITY: I certify, that based on my findings, NURSING services are a medically necessary home health service. HOME BOUND STATUS: I certify that my clinical findings support that this patient is homebound (i.e., Due to illness or injury, pt requires aid of supportive devices such as crutches, cane, wheelchairs, walkers, the use of special transportation or the assistance of another person to leave their place of residence. There is a normal inability to leave the home and doing so requires considerable and taxing effort. Other absences are for medical reasons / religious services and are infrequent or of short duration when for other reasons). o If current dressing causes regression in wound condition, may D/C ordered dressing product/s and apply Normal Saline  Moist Dressing daily until next [place] / Other MD appointment. [place] of regression in wound condition at [phone]. o Please direct any NON-WOUND related issues/requests for orders to patient's Primary Care Physician Electronic Signature(s) Signed: 03/27/2016 3:40:17 PM By: Judene Companion MD Signed: 03/27/2016 4:23:36 PM By: Gretta Cool RN, BSN, Kim RN, BSN Entered By: Gretta Cool, RN, BSN, Kim on 03/27/2016 13:54:43 [name] ([id number]) -------------------------------------------------------------------------------- Problem List Details Patient Name: [name], [name] 03/27/2016 1:30 Date of Service: PM Medical Record [id number] Number: Patient Account Number: [id number] May 23, 1936 (80 y.o. Treating RN: Cornell Barman Date of Birth/Sex: Female) Other Clinician: Primary Care Physician: Halina Maidens Treating Jerline Pain, [facility] Referring Physician: Halina Maidens Physician/Extender: Suella Grove in Treatment: 16 Active Problems ICD-10 Encounter Code Description Active Date Diagnosis E11.621 Type 2 diabetes mellitus with foot ulcer 12/02/2015 Yes L89.613 Pressure ulcer of right heel, stage 3 12/02/2015 Yes Z79.01 Long term (current) use of anticoagulants 12/02/2015 Yes S41.112A Laceration without foreign body of left upper arm, initial 02/28/2016 Yes encounter S41.102A Unspecified open wound of left upper arm, initial 02/28/2016 Yes encounter Inactive Problems Resolved Problems ICD-10 Code Description Active Date Resolved Date L97.212 Non-pressure chronic ulcer of right calf with fat layer 12/02/2015 12/02/2015 exposed L97.211 Non-pressure chronic ulcer of right calf limited to 12/02/2015 12/02/2015 breakdown of skin [name], [name] ([id number]) Electronic Signature(s) Signed: 03/27/2016 2:06:08 PM By: Judene Companion MD Entered By: Judene Companion on 03/27/2016 14:06:07 [name]  ([id number]) -------------------------------------------------------------------------------- Progress Note Details Patient Name: [name] 03/27/2016 1:30 Date of Service: PM Medical Record [id number] Number: Patient Account Number: [id number] 1935-09-29 (80 y.o. Treating RN: Cornell Barman Date of Birth/Sex: Female) Other Clinician: Primary Care Physician: Halina Maidens Treating Jerline Pain, [place] Referring Physician: Halina Maidens Physician/Extender: Suella Grove in Treatment: 16 Subjective Chief Complaint Information obtained from Patient Patient is at the clinic for treatment of an open pressure ulcer to her right calcaneum which has been there for about 4 weeks History of Present Illness (HPI) The following HPI elements were documented for the patient's wound: Location: right posterior heel, and both lower extremities Quality: Patient reports experiencing a sharp pain to affected area(s). Severity: Patient states wound (s) are getting better. Duration: Patient has had the wound for < 4 weeks prior to presenting for treatment Timing: Pain in wound is Intermittent (comes and goes Context: The wound appeared gradually over time while she was in hospital with a right hip fracture Modifying Factors: Consults to this date  include:was seen by her PCP and put on some antibiotics a while ago Associated Signs and Symptoms: Patient reports having difficulty standing for long periods. 80 year old patient referred who was recently discharged in April of this year with a left heel ulcer now comes with a right heel ulcer and some ulcerations in both lower extremities which have all come along during her recent hospitalization which she had a right hip fracture. She has a past medical history of COPD, diabetes mellitus type 2 with chronic kidney disease, multi-infarct dementia, anxiety, urinary retention, paroxysmal atrial fibrillation. She has also had moderate malnutrition, multi-infarct  dementia, congestive heart failure, left displaced femoral neck fracture, systemic lupus erythematosus. She is also status total abdominal hysterectomy with bilateral salpingo-oophorectomy, EGDs, hip arthroplasty on the left, cataract surgery. She has been a from a smoker and quit in May 2013 and she smoked for about 40 years. recently admitted to [facility] between January 19 and 07/03/2015 12/09/2015 -- right foot x-ray -- IMPRESSION:Soft tissue ulceration of the heel. No underlying erosive bony abnormality. Diffuse degenerative change. No acute bony abnormality. 12/30/2015-- admitted to the hospital between July 12 and July 18 of 2017 and was treated for cerebral infarction, infected decubitus ulcer and aspiration pneumonia of the right lung. MRI was negative for stroke and was thought to be secondary to pneumonia. She was treated with clindamycin. Patient was discharged home on Keflex and Zyvox. [name] ([id number]) Dr. Lucky Cowboy took her for an aortogram and a right lower extremity runoff and found to 30% stenosis in the mid to distal SFA but no significant stenosis in the popliteal artery and the SFA. There was a two-vessel runoff through the anterior tibial artery and peroneal artery without significant stenosis. felt her perfusion was adequate for wound healing. 02/28/2016 -- days ago she had a lacerated wound on her left forearm which they have been treating locally and have asked me to take a look today. 03/13/16: returns today for f/u. the right posterior heel wound has achieved epithelialization. the left forearm wound is healing without issue. no systemic s/s of infection. 03/20/16: returns today for f/u of a left forearm wound. husband reports dressing slides down. no reports of systemic s/s of infection. no new wounds or skin breakdown. Objective Constitutional Vitals Time Taken: 1:18 PM, Height: 64 in, Weight: 110 lbs, BMI: 18.9, Temperature: 97.6 F,  Pulse: 58 bpm, Respiratory Rate: 16 breaths/min, Blood Pressure: 134/52 mmHg. Integumentary (Hair, Skin) Wound #7 status is Open. Original cause of wound was Trauma. The wound is located on the Left,Anterior Forearm. The wound measures 3.5cm length x 2.5cm width x 0.1cm depth; 6.872cm^2 area and 0.687cm^3 volume. There is a large amount of serosanguineous drainage noted. The wound margin is distinct with the outline attached to the wound base. There is large (67-100%) red granulation within the wound bed. There is a small (1-33%) amount of necrotic tissue within the wound bed including Eschar. The periwound skin appearance exhibited: Moist. Periwound temperature was noted as No Abnormality. The periwound has tenderness on palpation. General Notes: Dressing is stuck to wound. Wound #8 status is Open. Original cause of wound was Trauma. The wound is located on the Right Forearm. The wound measures 0.2cm length x 2cm width x 0.1cm depth; 0.314cm^2 area and 0.031cm^3 volume. The wound is limited to skin breakdown. There is a medium amount of serosanguineous drainage noted. The wound margin is flat and intact. There is large (67-100%) pink granulation within the wound bed. There is  no necrotic tissue within the wound bed. The periwound skin appearance exhibited: Scarring, Moist. The periwound skin appearance did not exhibit: Callus, Crepitus, Excoriation, Fluctuance, Friable, Induration, Localized Edema, Rash, Dry/Scaly, Maceration, Atrophie Blanche, Cyanosis, Ecchymosis, Hemosiderin Staining, Mottled, Pallor, Rubor, Erythema. Assessment Active Problems [name], [name] ([id number]) ICD-10 E11.621 - Type 2 diabetes mellitus with foot ulcer L89.613 - Pressure ulcer of right heel, stage 3 Z79.01 - Long term (current) use of anticoagulants S41.112A - Laceration without foreign body of left upper arm, initial encounter S41.102A - Unspecified open wound of left upper arm, initial encounter After  debriding non-viablle we dressed with siver alginate. Good granulation tissue Procedures Wound #7 Wound #7 is a Trauma, Other located on the Left,Anterior Forearm . There was a Skin/Subcutaneous Tissue Debridement [id number]) debridement with total area of 8.75 sq cm performed by Judene Companion, MD. with the following instrument(s): Forceps and Scissors to remove Viable tissue/material including Fibrin/Slough, Eschar, and Subcutaneous after achieving pain control using Other (lidocaine 4%). A time out was conducted at 13:40, prior to the start of the procedure. A Minimum amount of bleeding was controlled with Pressure. The procedure was tolerated well with a pain level of 2 throughout and a pain level of 0 following the procedure. Post Debridement Measurements: 3.5cm length x 2.5cm width x 0.1cm depth; 0.687cm^3 volume. Character of Wound/Ulcer Post Debridement requires further debridement. Severity of Tissue Post Debridement is: Fat layer exposed. Post procedure Diagnosis Wound #7: Same as Pre-Procedure Plan Wound Cleansing: Wound #7 Left,Anterior Forearm: Clean wound with Normal Saline. - in clinic Cleanse wound with mild soap and water Anesthetic: Wound #7 Left,Anterior Forearm: Topical Lidocaine 4% cream applied to wound bed prior to debridement Primary Wound Dressing: Wound #7 Left,Anterior Forearm: Mepitel One Prisma Ag Wound #8 Right Forearm: Mepitel One [name], [name] ([id number]) Prisma Ag Secondary Dressing: Wound #7 Left,Anterior Forearm: Conform/Kerlix - Stretch net to secure Wound #8 Right Forearm: Conform/Kerlix - Stretch net to secure Dressing Change Frequency: Wound #7 Left,Anterior Forearm: Other: - twice weekly Wound #8 Right Forearm: Other: - twice weekly Follow-up Appointments: Wound #7 Left,Anterior Forearm: Return Appointment in 1 week. Wound #8 Right Forearm: Return Appointment in 1 week. Additional Orders / Instructions: Wound #7 Left,Anterior  Forearm: Increase protein intake. Activity as tolerated - VItamin C, A, ZINC, MVI Home Health: Wound #7 Left,Anterior Forearm: Continue Home Health Visits - [place] Nurse may visit PRN to address patient s wound care needs. FACE TO FACE ENCOUNTER: MEDICARE and MEDICAID PATIENTS: I certify that this patient is under my care and that I had a face-to-face encounter that meets the physician face-to-face encounter requirements with this patient on this date. The encounter with the patient was in whole or in part for the following MEDICAL CONDITION: (primary reason for [place]) MEDICAL NECESSITY: I certify, that based on my findings, NURSING services are a medically necessary home health service. HOME BOUND STATUS: I certify that my clinical findings support that this patient is homebound (i.e., Due to illness or injury, pt requires aid of supportive devices such as crutches, cane, wheelchairs, walkers, the use of special transportation or the assistance of another person to leave their place of residence. There is a normal inability to leave the home and doing so requires considerable and taxing effort. Other absences are for medical reasons / religious services and are infrequent or of short duration when for other reasons). If current dressing causes regression in wound condition, may D/C ordered dressing product/s and apply Normal Saline  Moist Dressing daily until next [place] / Other MD appointment. [place] of regression in wound condition at [phone]. Please direct any NON-WOUND related issues/requests for orders to patient's Primary Care Physician Follow-Up Appointments: A follow-up appointment should be scheduled. Medication Reconciliation completed and provided to Patient/Care Provider. A Patient Clinical Summary of Care was provided to [name], [name] ([id number]) Electronic Signature(s) Signed: 03/27/2016 2:10:39 PM By:  Judene Companion MD Entered By: Judene Companion on 03/27/2016 14:10:39 [name] ([id number]) -------------------------------------------------------------------------------- SuperBill Details Patient Name: [name] Date of Service: 03/27/2016 Medical Record Number: [id number] Patient Account Number: [id number] Date of Birth/Sex: [birth year] (80 y.o. Female) Treating RN: Cornell Barman Primary Care Physician: Halina Maidens Other Clinician: Referring Physician: Halina Maidens Treating Physician/Extender: Benjaman Pott in Treatment: 16 Diagnosis Coding ICD-10 Codes Code Description E11.621 Type 2 diabetes mellitus with foot ulcer L89.613 Pressure ulcer of right heel, stage 3 Z79.01 Long term (current) use of anticoagulants S41.112A Laceration without foreign body of left upper arm, initial encounter S41.102A Unspecified open wound of left upper arm, initial encounter Facility Procedures CPT4 Code Description: [id number] - DEB SUBQ TISSUE 20 SQ CM/< ICD-10 Description Diagnosis S41.112A Laceration without foreign body of left upper arm, Modifier: initial encoun Quantity: 1 ter Physician Procedures CPT4 Code Description: [id number] - WC PHYS SUBQ TISS 20 SQ CM ICD-10 Description Diagnosis S41.112A Laceration without foreign body of left upper arm, Modifier: initial encoun Quantity: 1 ter Electronic Signature(s) Signed: 03/27/2016 2:12:21 PM By: Judene Companion MD Entered By: Judene Companion on 03/27/2016 14:12:21

## 2016-03-27 NOTE — Progress Notes (Signed)
TIAIRRA, COONRADT ([id number]) Visit Report for 03/27/2016 Arrival Information Details Patient Name: [name], [name]. Date of Service: 03/27/2016 1:30 PM Medical Record Number: [id number] Patient Account Number: [id number] Date of Birth/Sex: 07/29/1935 (79 y.o. Female) Treating RN: Cornell Barman Primary Care Physician: Halina Maidens Other Clinician: Referring Physician: Halina Maidens Treating Physician/Extender: Benjaman Pott in Treatment: 16 Visit Information History Since Last Visit Added or deleted any medications: No Patient Arrived: Wheel Chair Any new allergies or adverse reactions: No Arrival Time: 13:17 Had a fall or experienced change in No Accompanied By: husband and activities of daily living that may affect caregiver risk of falls: Transfer Assistance: Manual Signs or symptoms of abuse/neglect since last No Patient Identification Verified: Yes visito Secondary Verification Process Yes Hospitalized since last visit: No Completed: Has Dressing in Place as Prescribed: Yes Patient Requires Transmission- No Pain Present Now: No Based Precautions: Patient Has Alerts: Yes Patient Alerts: Patient on Blood Thinner Electronic Signature(s) Signed: 03/27/2016 4:23:36 PM By: Gretta Cool, RN, BSN, Kim RN, BSN Entered By: Gretta Cool, RN, BSN, Kim on 03/27/2016 13:17:37 [name] ([id number]) -------------------------------------------------------------------------------- Encounter Discharge Information Details Patient Name: [name]. Date of Service: 03/27/2016 1:30 PM Medical Record Number: [id number] Patient Account Number: [id number] Date of Birth/Sex: 1935/12/28 (79 y.o. Female) Treating RN: Cornell Barman Primary Care Physician: Halina Maidens Other Clinician: Referring Physician: Halina Maidens Treating Physician/Extender: Benjaman Pott in Treatment: 71 Encounter Discharge Information Items Discharge Pain Level: 0 Discharge Condition:  Stable Ambulatory Status: Wheelchair Discharge Destination: Home Transportation: Private Auto husband and Accompanied By: caregiver Schedule Follow-up Appointment: Yes Medication Reconciliation completed and provided to Patient/Care Yes [name]: Provided on Clinical Summary of Care: 03/27/2016 Form Type Recipient Paper Patient LK Electronic Signature(s) Signed: 03/27/2016 2:12:57 PM By: Judene Companion MD Previous Signature: 03/27/2016 1:53:31 PM Version By: Ruthine Dose Entered By: Judene Companion on 03/27/2016 14:12:57 [name] ([id number]) -------------------------------------------------------------------------------- Multi Wound Chart Details Patient Name: [name] Date of Service: 03/27/2016 1:30 PM Medical Record Number: [id number] Patient Account Number: [id number] Date of Birth/Sex: [birth year] (79 y.o. Female) Treating RN: Cornell Barman Primary Care Physician: Halina Maidens Other Clinician: Referring Physician: Halina Maidens Treating Physician/Extender: Benjaman Pott in Treatment: 16 Vital Signs Height(in): 64 Pulse(bpm): 58 Weight(lbs): 110 Blood Pressure 134/52 (mmHg): Body Mass Index(BMI): 19 Temperature(F): 97.6 Respiratory Rate 16 (breaths/min): Photos: [N/A:N/A] Wound Location: Left Forearm - Anterior Right Forearm N/A Wounding Event: Trauma Trauma N/A Primary Etiology: Trauma, Other Trauma, Other N/A Comorbid History: Chronic Obstructive Chronic Obstructive N/A Pulmonary Disease Pulmonary Disease (COPD), Angina, (COPD), Angina, Congestive Heart Failure, Congestive Heart Failure, Type II Diabetes, Lupus Type II Diabetes, Lupus Erythematosus, Erythematosus, Osteoarthritis, Dementia Osteoarthritis, Dementia Date Acquired: 02/24/2016 03/21/2016 N/A Weeks of Treatment: 4 0 N/A Wound Status: Open Open N/A Measurements L x W x D 3.5x2.5x0.1 0.2x2x0.1 N/A (cm) Area (cm) : 6.872 0.314 N/A Volume (cm) : 0.687 0.031 N/A %  Reduction in Area: 0.00% N/A N/A % Reduction in Volume: 0.00% N/A N/A Classification: Partial Thickness Partial Thickness N/A Exudate Amount: Large Medium N/A Exudate Type: Serosanguineous Serosanguineous N/A Exudate Color: red, brown red, brown N/A Wound Margin: Distinct, outline attached Flat and Intact N/A [name], [name] ([id number]) Granulation Amount: Large (67-100%) Large (67-100%) N/A Granulation Quality: Red Pink N/A Necrotic Amount: Small (1-33%) None Present (0%) N/A Necrotic Tissue: Eschar N/A N/A Epithelialization: None N/A N/A Periwound Skin Texture: No Abnormalities Noted Scarring: Yes N/A Edema: No Excoriation: No Induration: No Callus: No Crepitus: No Fluctuance: No Friable: No Rash:  No Periwound Skin Moist: Yes Moist: Yes N/A Moisture: Maceration: No Dry/Scaly: No Periwound Skin Color: No Abnormalities Noted Atrophie Blanche: No N/A Cyanosis: No Ecchymosis: No Erythema: No Hemosiderin Staining: No Mottled: No Pallor: No Rubor: No Temperature: No Abnormality N/A N/A Tenderness on Yes No N/A Palpation: Wound Preparation: Ulcer Cleansing: Ulcer Cleansing: N/A Rinsed/Irrigated with Rinsed/Irrigated with Saline Saline Topical Anesthetic Topical Anesthetic Applied: Other: lidocaine Applied: None 4% Assessment Notes: Dressing is stuck to N/A N/A wound. Treatment Notes Electronic Signature(s) Signed: 03/27/2016 4:23:36 PM By: Gretta Cool, RN, BSN, Kim RN, BSN Entered By: Gretta Cool, RN, BSN, Kim on 03/27/2016 13:34:11 [name] ([id number]) -------------------------------------------------------------------------------- [place] Details Patient Name: [name], [name]. Date of Service: 03/27/2016 1:30 PM Medical Record Number: [id number] Patient Account Number: [id number] Date of Birth/Sex: June 20, 1935 (79 y.o. Female) Treating RN: Cornell Barman Primary Care Physician: Halina Maidens Other Clinician: Referring Physician: Halina Maidens Treating Physician/Extender: Benjaman Pott in Treatment: 74 Active Inactive Abuse / Safety / Falls / Self Care Management Nursing Diagnoses: Potential for falls Goals: Patient/caregiver will verbalize understanding of skin care regimen Date Initiated: 12/02/2015 Goal Status: Active Patient/caregiver will verbalize/demonstrate measures taken to prevent injury and/or falls Date Initiated: 12/02/2015 Goal Status: Active Interventions: Assess fall risk on admission and as needed Assess: immobility, friction, shearing, incontinence upon admission and as needed Assess impairment of mobility on admission and as needed per policy Assess self care needs on admission and as needed Provide education on fall prevention Provide education on safe transfers Notes: Wound/Skin Impairment Nursing Diagnoses: Impaired tissue integrity Goals: Patient/caregiver will verbalize understanding of skin care regimen Date Initiated: 12/02/2015 Goal Status: Active Interventions: Assess patient/caregiver ability to obtain necessary supplies Assess patient/caregiver ability to perform ulcer/skin care regimen upon admission and as needed [name], [name] ([id number]) Assess ulceration(s) every visit Provide education on ulcer and skin care Notes: Electronic Signature(s) Signed: 03/27/2016 4:23:36 PM By: Gretta Cool, RN, BSN, Kim RN, BSN Entered By: Gretta Cool, RN, BSN, Kim on 03/27/2016 13:34:00 [name] ([id number]) -------------------------------------------------------------------------------- Pain Assessment Details Patient Name: [name] Date of Service: 03/27/2016 1:30 PM Medical Record Number: [id number] Patient Account Number: [id number] Date of Birth/Sex: 06-May-1936 (79 y.o. Female) Treating RN: Cornell Barman Primary Care Physician: Halina Maidens Other Clinician: Referring Physician: Halina Maidens Treating Physician/Extender: Benjaman Pott in Treatment: 16 Active  Problems Location of Pain Severity and Description of Pain Patient Has Paino No Site Locations With Dressing Change: No Pain Management and Medication Current Pain Management: Notes Topical or injectable lidocaine is offered to patient for acute pain when surgical debridement is performed. If needed, Patient is instructed to use over the counter pain medication for the following 24-48 hours after debridement. Wound care MDs do not prescribed pain medications. Patient has chronic pain or uncontrolled pain. Patient has been instructed to make an appointment with their Primary Care Physician for pain management. Electronic Signature(s) Signed: 03/27/2016 4:23:36 PM By: Gretta Cool, RN, BSN, Kim RN, BSN Entered By: Gretta Cool, RN, BSN, Kim on 03/27/2016 13:17:59 [name] ([id number]) -------------------------------------------------------------------------------- Patient/Caregiver Education Details Patient Name: [name] Date of Service: 03/27/2016 1:30 PM Medical Record Number: [id number] Patient Account Number: [id number] Date of Birth/Gender: Nov 08, 1935 (79 y.o. Female) Treating RN: Cornell Barman Primary Care Physician: Halina Maidens Other Clinician: Referring Physician: Halina Maidens Treating Physician/Extender: Benjaman Pott in Treatment: 16 Education Assessment Education Provided To: Patient Education Topics Provided Wound/Skin Impairment: Handouts: Caring for Your Ulcer, Other: continue wound care as prescribed Methods: Demonstration, Explain/Verbal Responses: State  content correctly Electronic Signature(s) Signed: 03/27/2016 3:40:17 PM By: Judene Companion MD Entered By: Judene Companion on 03/27/2016 14:13:07 [name] ([id number]) -------------------------------------------------------------------------------- Wound Assessment Details Patient Name: [name] Date of Service: 03/27/2016 1:30 PM Medical Record Number: [id number] Patient Account  Number: [id number] Date of Birth/Sex: [birth year] (79 y.o. Female) Treating RN: Cornell Barman Primary Care Physician: Halina Maidens Other Clinician: Referring Physician: Halina Maidens Treating Physician/Extender: Benjaman Pott in Treatment: 16 Wound Status Wound Number: 7 Primary Trauma, Other Etiology: Wound Location: Left Forearm - Anterior Wound Open Wounding Event: Trauma Status: Date Acquired: 02/24/2016 Comorbid Chronic Obstructive Pulmonary Disease Weeks Of Treatment: 4 History: (COPD), Angina, Congestive Heart Clustered Wound: No Failure, Type II Diabetes, Lupus Erythematosus, Osteoarthritis, Dementia Photos Wound Measurements Length: (cm) 3.5 Width: (cm) 2.5 Depth: (cm) 0.1 Area: (cm) 6.872 Volume: (cm) 0.687 % Reduction in Area: 0% % Reduction in Volume: 0% Epithelialization: None Wound Description Classification: Partial Thickness Wound Margin: Distinct, outline attached Exudate Amount: Large Exudate Type: Serosanguineous Exudate Color: red, brown Foul Odor After Cleansing: No Wound Bed Granulation Amount: Large (67-100%) Granulation Quality: Red Necrotic Amount: Small (1-33%) Necrotic Quality: Eschar Periwound Skin Texture [name], [name] ([id number]) Texture Color No Abnormalities Noted: No No Abnormalities Noted: No Moisture Temperature / Pain No Abnormalities Noted: No Temperature: No Abnormality Moist: Yes Tenderness on Palpation: Yes Wound Preparation Ulcer Cleansing: Rinsed/Irrigated with Saline Topical Anesthetic Applied: Other: lidocaine 4%, Assessment Notes Dressing is stuck to wound. Treatment Notes Wound #7 (Left, Anterior Forearm) 1. Cleansed with: Clean wound with Normal Saline 2. Anesthetic Topical Lidocaine 4% cream to wound bed prior to debridement 4. Dressing Applied: Prisma Ag Mepitel 5. Secondary Dressing Applied Dry Gauze Kerlix/Conform 7. Secured with Tubular bandage Electronic Signature(s) Signed:  03/27/2016 4:23:36 PM By: Gretta Cool, RN, BSN, Kim RN, BSN Entered By: Gretta Cool, RN, BSN, Kim on 03/27/2016 13:41:33 [name] ([id number]) -------------------------------------------------------------------------------- Wound Assessment Details Patient Name: [name] Date of Service: 03/27/2016 1:30 PM Medical Record Number: [id number] Patient Account Number: [id number] Date of Birth/Sex: Feb 06, 1936 (79 y.o. Female) Treating RN: Cornell Barman Primary Care Physician: Halina Maidens Other Clinician: Referring Physician: Halina Maidens Treating Physician/Extender: Benjaman Pott in Treatment: 16 Wound Status Wound Number: 8 Primary Trauma, Other Etiology: Wound Location: Right Forearm Wound Open Wounding Event: Trauma Status: Date Acquired: 03/21/2016 Comorbid Chronic Obstructive Pulmonary Disease Weeks Of Treatment: 0 History: (COPD), Angina, Congestive Heart Clustered Wound: No Failure, Type II Diabetes, Lupus Erythematosus, Osteoarthritis, Dementia Photos Wound Measurements Length: (cm) 0.2 % Reduction in Width: (cm) 2 % Reduction in Depth: (cm) 0.1 Area: (cm) 0.314 Volume: (cm) 0.031 Area: Volume: Wound Description Classification: Partial Thickness Wound Margin: Flat and Intact Exudate Amount: Medium Exudate Type: Serosanguineous Exudate Color: red, brown Wound Bed Granulation Amount: Large (67-100%) Exposed Structure Granulation Quality: Pink Fascia Exposed: No Necrotic Amount: None Present (0%) Fat Layer Exposed: No Tendon Exposed: No Muscle Exposed: No Joint Exposed: No [name], [name] ([id number]) Bone Exposed: No Limited to Skin Breakdown Periwound Skin Texture Texture Color No Abnormalities Noted: No No Abnormalities Noted: No Callus: No Atrophie Blanche: No Crepitus: No Cyanosis: No Excoriation: No Ecchymosis: No Fluctuance: No Erythema: No Friable: No Hemosiderin Staining: No Induration: No Mottled: No Localized Edema:  No Pallor: No Rash: No Rubor: No Scarring: Yes Moisture No Abnormalities Noted: No Dry / Scaly: No Maceration: No Moist: Yes Wound Preparation Ulcer Cleansing: Rinsed/Irrigated with Saline Topical Anesthetic Applied: None Treatment Notes Wound #8 (Right Forearm) 1. Cleansed with: Clean wound with Normal  Saline 2. Anesthetic Topical Lidocaine 4% cream to wound bed prior to debridement 4. Dressing Applied: Prisma Ag Mepitel 5. Secondary Dressing Applied Dry Gauze Kerlix/Conform 7. Secured with Tubular bandage Electronic Signature(s) Signed: 03/27/2016 4:23:36 PM By: Gretta Cool, RN, BSN, Kim RN, BSN Entered By: Gretta Cool, RN, BSN, Kim on 03/27/2016 13:33:52 [name] ([id number]) -------------------------------------------------------------------------------- [place] Details Patient Name: [name] Date of Service: 03/27/2016 1:30 PM Medical Record Number: [id number] Patient Account Number: [id number] Date of Birth/Sex: 09-19-1935 (79 y.o. Female) Treating RN: Cornell Barman Primary Care Physician: Halina Maidens Other Clinician: Referring Physician: Halina Maidens Treating Physician/Extender: Benjaman Pott in Treatment: 16 Vital Signs Time Taken: 13:18 Temperature (F): 97.6 Height (in): 64 Pulse (bpm): 58 Weight (lbs): 110 Respiratory Rate (breaths/min): 16 Body Mass Index (BMI): 18.9 Blood Pressure (mmHg): 134/52 Reference Range: 80 - 120 mg / dl Electronic Signature(s) Signed: 03/27/2016 4:23:36 PM By: Gretta Cool, RN, BSN, Kim RN, BSN Entered By: Gretta Cool, RN, BSN, Kim on 03/27/2016 13:21:02

## 2016-03-29 ENCOUNTER — Telehealth: Payer: Self-pay

## 2016-03-29 DIAGNOSIS — Z23 Encounter for immunization: Secondary | ICD-10-CM | POA: Diagnosis not present

## 2016-03-29 DIAGNOSIS — G4734 Idiopathic sleep related nonobstructive alveolar hypoventilation: Secondary | ICD-10-CM | POA: Diagnosis not present

## 2016-03-29 DIAGNOSIS — J449 Chronic obstructive pulmonary disease, unspecified: Secondary | ICD-10-CM | POA: Diagnosis not present

## 2016-03-29 DIAGNOSIS — M329 Systemic lupus erythematosus, unspecified: Secondary | ICD-10-CM | POA: Diagnosis not present

## 2016-03-29 DIAGNOSIS — F0280 Dementia in other diseases classified elsewhere without behavioral disturbance: Secondary | ICD-10-CM | POA: Diagnosis not present

## 2016-03-29 DIAGNOSIS — G309 Alzheimer's disease, unspecified: Secondary | ICD-10-CM | POA: Diagnosis not present

## 2016-03-29 NOTE — Telephone Encounter (Signed)
Her A1C for diabetic control was good last check.  I do not recommend stopping the medication because she is doing well right now.  You can reduce frequency of blood sugar checks to 3 times per week as long as they stay in the 130 range.

## 2016-03-29 NOTE — Telephone Encounter (Signed)
Husband called to report Blood Sugars have been 110-130. He asks Can we leave it alone and stop any meds and stop checking BS?

## 2016-03-30 NOTE — Telephone Encounter (Signed)
Advised to nurse at house to have her stay on meds and she relayed it to husband and when she said BS are low she said 105-110 I told her this is good and to not change anything accept checking BS 3 times a week instead of daily.

## 2016-04-03 ENCOUNTER — Encounter: Payer: Medicare Other | Admitting: Surgery

## 2016-04-03 DIAGNOSIS — S41112A Laceration without foreign body of left upper arm, initial encounter: Secondary | ICD-10-CM | POA: Diagnosis not present

## 2016-04-03 DIAGNOSIS — L89613 Pressure ulcer of right heel, stage 3: Secondary | ICD-10-CM | POA: Diagnosis not present

## 2016-04-03 DIAGNOSIS — S51802A Unspecified open wound of left forearm, initial encounter: Secondary | ICD-10-CM | POA: Diagnosis not present

## 2016-04-03 DIAGNOSIS — Z7901 Long term (current) use of anticoagulants: Secondary | ICD-10-CM | POA: Diagnosis not present

## 2016-04-03 DIAGNOSIS — J449 Chronic obstructive pulmonary disease, unspecified: Secondary | ICD-10-CM | POA: Diagnosis not present

## 2016-04-03 DIAGNOSIS — E11621 Type 2 diabetes mellitus with foot ulcer: Secondary | ICD-10-CM | POA: Diagnosis not present

## 2016-04-03 DIAGNOSIS — S41102A Unspecified open wound of left upper arm, initial encounter: Secondary | ICD-10-CM | POA: Diagnosis not present

## 2016-04-04 ENCOUNTER — Telehealth: Payer: Self-pay | Admitting: Internal Medicine

## 2016-04-04 ENCOUNTER — Other Ambulatory Visit: Payer: Self-pay | Admitting: Internal Medicine

## 2016-04-04 DIAGNOSIS — E1122 Type 2 diabetes mellitus with diabetic chronic kidney disease: Secondary | ICD-10-CM

## 2016-04-04 DIAGNOSIS — N181 Chronic kidney disease, stage 1: Principal | ICD-10-CM

## 2016-04-04 MED ORDER — GLIMEPIRIDE 2 MG PO TABS: 2.0000 mg | ORAL_TABLET | Freq: Every day | ORAL | 5 refills | Status: DC

## 2016-04-04 NOTE — Progress Notes (Signed)
[name], [name] ([id number]) Visit Report for 04/03/2016 Fall Risk Assessment Details Patient Name: [name], [name] 04/03/2016 12:45 Date of Service: PM Medical Record [id number] Number: Patient Account Number: [id number] 1936-01-14 (80 y.o. Treating RN: Cornell Barman Date of Birth/Sex: Female) Other Clinician: Primary Care Treating [employer] Berneta Levins Physician: Physician/Extender: Referring Physician: Evert Kohl in Treatment: 17 Fall Risk Assessment Items Have you had 2 or more falls in the last 12 monthso 0 Yes Have you had any fall that resulted in injury in the last 12 monthso 0 Yes FALL RISK ASSESSMENT: History of falling - immediate or within 3 months 25 Yes Secondary diagnosis 0 No Ambulatory aid None/bed rest/wheelchair/nurse 0 Yes Crutches/cane/walker 0 No Furniture 0 No IV Access/Saline Lock 0 No Gait/Training Normal/bed rest/immobile 0 No Weak 10 Yes Impaired 0 No Mental Status Oriented to own ability 0 No Electronic Signature(s) Signed: 04/03/2016 5:06:45 PM By: Gretta Cool, RN, BSN, Kim RN, BSN Entered By: Gretta Cool, RN, BSN, Kim on 04/03/2016 12:38:58

## 2016-04-04 NOTE — Progress Notes (Signed)
RAJINDER, ROSENBERRY ([id number]) Visit Report for 04/03/2016 Chief Complaint Document Details Patient Name: [name], [name] 04/03/2016 12:45 Date of Service: PM Medical Record [id number] Number: Patient Account Number: [id number] [birth year] (80 y.o. Treating RN: Cornell Barman Date of Birth/Sex: Female) Other Clinician: Primary Care Treating Army Melia Berneta Levins Physician: Physician/Extender: Referring Physician: Evert Kohl in Treatment: 17 Information Obtained from: Patient Chief Complaint Patient is at the clinic for treatment of an open pressure ulcer to her right calcaneum which has been there for about 4 weeks Electronic Signature(s) Signed: 04/03/2016 1:39:48 PM By: [name], FACS Entered By: [name] on 04/03/2016 13:39:47 [name] ([id number]) -------------------------------------------------------------------------------- HPI Details Patient Name: [name], [name] 04/03/2016 12:45 Date of Service: PM Medical Record [id number] Number: Patient Account Number: [id number] 1935/06/16 (80 y.o. Treating RN: Cornell Barman Date of Birth/Sex: Female) Other Clinician: Primary Care Treating Army Melia Berneta Levins Physician: Physician/Extender: Referring Physician: Evert Kohl in Treatment: 17 History of Present Illness Location: right posterior heel, and both lower extremities Quality: Patient reports experiencing a sharp pain to affected area(s). Severity: Patient states wound (s) are getting better. Duration: Patient has had the wound for < 4 weeks prior to presenting for treatment Timing: Pain in wound is Intermittent (comes and goes Context: The wound appeared gradually over time while she was in hospital with a right hip fracture Modifying Factors: Consults to this date include:was seen by her PCP and put on some antibiotics a while ago Associated Signs and Symptoms: Patient reports having difficulty standing for  long periods. HPI Description: 80 year old patient referred who was recently discharged in April of this year with a left heel ulcer now comes with a right heel ulcer and some ulcerations in both lower extremities which have all come along during her recent hospitalization which she had a right hip fracture. She has a past medical history of COPD, diabetes mellitus type 2 with chronic kidney disease, multi-infarct dementia, anxiety, urinary retention, paroxysmal atrial fibrillation. She has also had moderate malnutrition, multi-infarct dementia, congestive heart failure, left displaced femoral neck fracture, systemic lupus erythematosus. She is also status total abdominal hysterectomy with bilateral salpingo-oophorectomy, EGDs, hip arthroplasty on the left, cataract surgery. She has been a from a smoker and quit in May 2013 and she smoked for about 40 years. recently admitted to [facility] between January 19 and 07/03/2015 12/09/2015 -- o right foot x-ray -- IMPRESSION:Soft tissue ulceration of the heel. No underlying erosive bony abnormality. Diffuse degenerative change. No acute bony abnormality. 12/30/2015-- admitted to the hospital between July 12 and July 18 of 2017 and was treated for cerebral infarction, infected decubitus ulcer and aspiration pneumonia of the right lung. MRI was negative for stroke and was thought to be secondary to pneumonia. She was treated with clindamycin. Patient was discharged home on Keflex and Zyvox. Dr. Lucky Cowboy took her for an aortogram and a right lower extremity runoff and found to 30% stenosis in the mid to distal SFA but no significant stenosis in the popliteal artery and the SFA. There was a two-vessel runoff through the anterior tibial artery and peroneal artery without significant stenosis. felt her perfusion was adequate for wound healing. 02/28/2016 -- days ago she had a lacerated wound on her left forearm which they have been treating  locally and have asked me to take a look today. 03/13/16: returns today for f/u. the right posterior heel wound has achieved epithelialization. the left forearm [name], [name] ([id number]) wound is healing  without issue. no systemic s/s of infection. 03/20/16: returns today for f/u of a left forearm wound. husband reports dressing slides down. no reports of systemic s/s of infection. no new wounds or skin breakdown. 04/03/2016 -- she has had another laceration with a skin flap on the left upper arm and she also has one on the anterior part of the left forearm. Electronic Signature(s) Signed: 04/03/2016 1:40:38 PM By: [name], FACS Entered By: [name] on 04/03/2016 13:40:37 [name] ([id number]) -------------------------------------------------------------------------------- Physical Exam Details Patient Name: [name], [name] 04/03/2016 12:45 Date of Service: PM Medical Record [id number] Number: Patient Account Number: [id number] [birth year] (80 y.o. Treating RN: Cornell Barman Date of Birth/Sex: Female) Other Clinician: Primary Care Treating Army Melia Berneta Levins Physician: Physician/Extender: Referring Physician: Evert Kohl in Treatment: 17 Constitutional . Pulse regular. Respirations normal and unlabored. Afebrile. . Eyes Nonicteric. Reactive to light. Ears, Nose, Mouth, and Throat Lips, teeth, and gums WNL.Marland Kitchen Moist mucosa without lesions. Neck supple and nontender. No palpable supraclavicular or cervical adenopathy. Normal sized without goiter. Respiratory WNL. No retractions.. Cardiovascular Pedal Pulses WNL. No clubbing, cyanosis or edema. Lymphatic No adneopathy. No adenopathy. No adenopathy. Musculoskeletal Adexa without tenderness or enlargement.. Digits and nails w/o clubbing, cyanosis, infection, petechiae, ischemia, or inflammatory conditions.. Integumentary (Hair, Skin) No suspicious lesions. No crepitus or fluctuance.  No peri-wound warmth or erythema. No masses.Marland Kitchen Psychiatric Judgement and insight Intact.. No evidence of depression, anxiety, or agitation.. Notes the left upper arm just lateral to the elbow has a skin flap which was washed out with saline and part of it trimmed and then put back in place. I covered this with a Mepitel and Steri-Stripped in place after placing some skin prep. We will cover this with Telfa gauze and wrap this up. The left forearm will be dressed with Mepitel and Hydrofera Blue and left intact for a week. Electronic Signature(s) Signed: 04/03/2016 1:41:33 PM By: [name], FACS Entered By: [name] on 04/03/2016 13:41:31 [name] ([id number]) -------------------------------------------------------------------------------- Physician Orders Details Patient Name: [name], [name] 04/03/2016 12:45 Date of Service: PM Medical Record [id number] Number: Patient Account Number: [id number] 1936/05/21 (80 y.o. Treating RN: Cornell Barman Date of Birth/Sex: Female) Other Clinician: Primary Care Treating Army Melia Berneta Levins Physician: Physician/Extender: Referring Physician: Evert Kohl in Treatment: 17 Verbal / Phone Orders: Yes Clinician: Cornell Barman Read Back and Verified: Yes Diagnosis Coding Wound Cleansing Wound #7 Left,Anterior Forearm o Clean wound with Normal Saline. - in clinic o Cleanse wound with mild soap and water Wound #9 Left Upper Arm o Clean wound with Normal Saline. - in clinic o Cleanse wound with mild soap and water Anesthetic Wound #7 Left,Anterior Forearm o Topical Lidocaine 4% cream applied to wound bed prior to debridement Wound #9 Left Upper Arm o Topical Lidocaine 4% cream applied to wound bed prior to debridement Skin Barriers/Peri-Wound Care Wound #7 Left,Anterior Forearm o Skin Prep Wound #9 Left Upper Arm o Skin Prep Primary Wound Dressing Wound #7 Left,Anterior Forearm o  Hydrafera Blue o Mepitel One Wound #9 Left Upper Arm o Mepitel One Secondary Dressing Wound #7 Left,Anterior Forearm [name], [name] ([id number]) o Conform/Kerlix - Stretch net to secure Wound #9 Left Upper Arm o Conform/Kerlix - Stretch net to secure o Non-adherent pad Dressing Change Frequency Wound #7 Left,Anterior Forearm o Change dressing every week Wound #9 Left Upper Arm o Change dressing every week Follow-up Appointments Wound #7 Left,Anterior Forearm o Return Appointment in 1 week. Wound #  9 Left Upper Arm o Return Appointment in 1 week. Additional Orders / Instructions Wound #7 Left,Anterior Forearm o Increase protein intake. o Activity as tolerated - VItamin C, A, ZINC, MVI Home Health Wound #7 Left,Anterior Forearm o Continue Home Health Visits - [facility]- Hold services this week, leave dressing in place. o Home Health Nurse may visit PRN to address patientos wound care needs. o FACE TO FACE ENCOUNTER: MEDICARE and MEDICAID PATIENTS: I certify that this patient is under my care and that I had a face-to-face encounter that meets the physician face-to-face encounter requirements with this patient on this date. The encounter with the patient was in whole or in part for the following MEDICAL CONDITION: (primary reason for [place]) MEDICAL NECESSITY: I certify, that based on my findings, NURSING services are a medically necessary home health service. HOME BOUND STATUS: I certify that my clinical findings support that this patient is homebound (i.e., Due to illness or injury, pt requires aid of supportive devices such as crutches, cane, wheelchairs, walkers, the use of special transportation or the assistance of another person to leave their place of residence. There is a normal inability to leave the home and doing so requires considerable and taxing effort. Other absences are for medical reasons / religious services and are infrequent  or of short duration when for other reasons). o If current dressing causes regression in wound condition, may D/C ordered dressing product/s and apply Normal Saline Moist Dressing daily until next [place] / Other MD appointment. [place] of regression in wound condition at [phone]. o Please direct any NON-WOUND related issues/requests for orders to patient's Primary Care Physician Wound #9 Left Upper Arm [name], [name] ([id number]) o [place] services this week, leave dressing in place. o Home Health Nurse may visit PRN to address patientos wound care needs. o FACE TO FACE ENCOUNTER: MEDICARE and MEDICAID PATIENTS: I certify that this patient is under my care and that I had a face-to-face encounter that meets the physician face-to-face encounter requirements with this patient on this date. The encounter with the patient was in whole or in part for the following MEDICAL CONDITION: (primary reason for [place]) MEDICAL NECESSITY: I certify, that based on my findings, NURSING services are a medically necessary home health service. HOME BOUND STATUS: I certify that my clinical findings support that this patient is homebound (i.e., Due to illness or injury, pt requires aid of supportive devices such as crutches, cane, wheelchairs, walkers, the use of special transportation or the assistance of another person to leave their place of residence. There is a normal inability to leave the home and doing so requires considerable and taxing effort. Other absences are for medical reasons / religious services and are infrequent or of short duration when for other reasons). o If current dressing causes regression in wound condition, may D/C ordered dressing product/s and apply Normal Saline Moist Dressing daily until next [place] / Other MD appointment. [place] of regression in  wound condition at [phone]. o Please direct any NON-WOUND related issues/requests for orders to patient's Primary Care Physician Electronic Signature(s) Signed: 04/03/2016 4:28:25 PM By: [name], FACS Signed: 04/03/2016 5:06:45 PM By: Gretta Cool RN, BSN, Kim RN, BSN Entered By: Gretta Cool, RN, BSN, Kim on 04/03/2016 14:52:12 [name] ([id number]) -------------------------------------------------------------------------------- Problem List Details Patient Name: [name], [name] 04/03/2016 12:45 Date of Service: PM Medical Record [id number] Number: Patient Account Number:  [id number] [birth year] (80 y.o. Treating RN: Cornell Barman Date of Birth/Sex: Female) Other Clinician: Primary Care Treating Army Melia Berneta Levins Physician: Physician/Extender: Referring Physician: Evert Kohl in Treatment: 17 Active Problems ICD-10 Encounter Code Description Active Date Diagnosis E11.621 Type 2 diabetes mellitus with foot ulcer 12/02/2015 Yes Z79.01 Long term (current) use of anticoagulants 12/02/2015 Yes S41.112A Laceration without foreign body of left upper arm, initial 02/28/2016 Yes encounter S41.102A Unspecified open wound of left upper arm, initial 02/28/2016 Yes encounter Inactive Problems Resolved Problems ICD-10 Code Description Active Date Resolved Date L97.212 Non-pressure chronic ulcer of right calf with fat layer 12/02/2015 12/02/2015 exposed L97.211 Non-pressure chronic ulcer of right calf limited to 12/02/2015 12/02/2015 breakdown of skin L89.613 Pressure ulcer of right heel, stage 3 12/02/2015 12/02/2015 [name] ([id number]) Electronic Signature(s) Signed: 04/03/2016 1:39:35 PM By: [name], FACS Entered By: [name] on 04/03/2016 13:39:35 [name] ([id number]) -------------------------------------------------------------------------------- Progress Note Details Patient Name: [name] 04/03/2016 12:45 Date  of Service: PM Medical Record [id number] Number: Patient Account Number: [id number] 02/09/1936 (80 y.o. Treating RN: Cornell Barman Date of Birth/Sex: Female) Other Clinician: Primary Care Treating Army Melia Berneta Levins Physician: Physician/Extender: Referring Physician: Evert Kohl in Treatment: 17 Subjective Chief Complaint Information obtained from Patient Patient is at the clinic for treatment of an open pressure ulcer to her right calcaneum which has been there for about 4 weeks History of Present Illness (HPI) The following HPI elements were documented for the patient's wound: Location: right posterior heel, and both lower extremities Quality: Patient reports experiencing a sharp pain to affected area(s). Severity: Patient states wound (s) are getting better. Duration: Patient has had the wound for < 4 weeks prior to presenting for treatment Timing: Pain in wound is Intermittent (comes and goes Context: The wound appeared gradually over time while she was in hospital with a right hip fracture Modifying Factors: Consults to this date include:was seen by her PCP and put on some antibiotics a while ago Associated Signs and Symptoms: Patient reports having difficulty standing for long periods. 80 year old patient referred who was recently discharged in April of this year with a left heel ulcer now comes with a right heel ulcer and some ulcerations in both lower extremities which have all come along during her recent hospitalization which she had a right hip fracture. She has a past medical history of COPD, diabetes mellitus type 2 with chronic kidney disease, multi-infarct dementia, anxiety, urinary retention, paroxysmal atrial fibrillation. She has also had moderate malnutrition, multi-infarct dementia, congestive heart failure, left displaced femoral neck fracture, systemic lupus erythematosus. She is also status total abdominal hysterectomy with bilateral  salpingo-oophorectomy, EGDs, hip arthroplasty on the left, cataract surgery. She has been a from a smoker and quit in May 2013 and she smoked for about 40 years. recently admitted to [facility] between January 19 and 07/03/2015 12/09/2015 -- right foot x-ray -- IMPRESSION:Soft tissue ulceration of the heel. No underlying erosive bony abnormality. Diffuse degenerative change. No acute bony abnormality. 12/30/2015-- admitted to the hospital between July 12 and July 18 of 2017 and was treated for cerebral infarction, infected decubitus ulcer and aspiration pneumonia of the right lung. MRI was negative for stroke and was thought to be secondary to pneumonia. She was treated with clindamycin. Patient was discharged [name], [name] ([id number]) home on Keflex and Zyvox. Dr. Lucky Cowboy took her for an aortogram and a right lower extremity runoff and found to 30% stenosis in the mid  to distal SFA but no significant stenosis in the popliteal artery and the SFA. There was a two-vessel runoff through the anterior tibial artery and peroneal artery without significant stenosis. felt her perfusion was adequate for wound healing. 02/28/2016 -- days ago she had a lacerated wound on her left forearm which they have been treating locally and have asked me to take a look today. 03/13/16: returns today for f/u. the right posterior heel wound has achieved epithelialization. the left forearm wound is healing without issue. no systemic s/s of infection. 03/20/16: returns today for f/u of a left forearm wound. husband reports dressing slides down. no reports of systemic s/s of infection. no new wounds or skin breakdown. 04/03/2016 -- she has had another laceration with a skin flap on the left upper arm and she also has one on the anterior part of the left forearm. Objective Constitutional Pulse regular. Respirations normal and unlabored. Afebrile. Vitals Time Taken: 12:39 PM, Height: 64 in, Weight:  110 lbs, BMI: 18.9, Temperature: 97.6 F, Pulse: 59 bpm, Respiratory Rate: 16 breaths/min, Blood Pressure: 156/59 mmHg. Eyes Nonicteric. Reactive to light. Ears, Nose, Mouth, and Throat Lips, teeth, and gums WNL.Marland Kitchen Moist mucosa without lesions. Neck supple and nontender. No palpable supraclavicular or cervical adenopathy. Normal sized without goiter. Respiratory WNL. No retractions.. Cardiovascular Pedal Pulses WNL. No clubbing, cyanosis or edema. Lymphatic No adneopathy. No adenopathy. No adenopathy. Musculoskeletal Adexa without tenderness or enlargement.. Digits and nails w/o clubbing, cyanosis, infection, petechiae, [name], [name]. ([id number]) ischemia, or inflammatory conditions.Marland Kitchen Psychiatric Judgement and insight Intact.. No evidence of depression, anxiety, or agitation.. General Notes: the left upper arm just lateral to the elbow has a skin flap which was washed out with saline and part of it trimmed and then put back in place. I covered this with a Mepitel and Steri-Stripped in place after placing some skin prep. We will cover this with Telfa gauze and wrap this up. The left forearm will be dressed with Mepitel and Hydrofera Blue and left intact for a week. Integumentary (Hair, Skin) No suspicious lesions. No crepitus or fluctuance. No peri-wound warmth or erythema. No masses.. Wound #7 status is Open. Original cause of wound was Trauma. The wound is located on the Left,Anterior Forearm. The wound measures 3cm length x 2cm width x 0.1cm depth; 4.712cm^2 area and 0.471cm^3 volume. There is no tunneling or undermining noted. There is a large amount of serosanguineous drainage noted. The wound margin is distinct with the outline attached to the wound base. There is large (67-100%) red granulation within the wound bed. There is a small (1-33%) amount of necrotic tissue within the wound bed including Eschar. The periwound skin appearance exhibited: Scarring, Maceration, Moist.  Periwound temperature was noted as No Abnormality. The periwound has tenderness on palpation. Wound #8 status is Healed - Epithelialized. Original cause of wound was Trauma. The wound is located on the Right Forearm. The wound measures 0cm length x 0cm width x 0cm depth; 0cm^2 area and 0cm^3 volume. The wound is limited to skin breakdown. There is no tunneling or undermining noted. There is a medium amount of serosanguineous drainage noted. The wound margin is flat and intact. There is no granulation within the wound bed. There is a large (67-100%) amount of necrotic tissue within the wound bed including Eschar. The periwound skin appearance exhibited: Scarring, Moist. The periwound skin appearance did not exhibit: Callus, Crepitus, Excoriation, Fluctuance, Friable, Induration, Localized Edema, Rash, Dry/Scaly, Maceration, Atrophie Blanche, Cyanosis, Ecchymosis, Hemosiderin Staining, Mottled, Pallor, Rubor,  Erythema. Wound #9 status is Open. Original cause of wound was Trauma. The wound is located on the Left Upper Arm. The wound measures 4cm length x 2cm width x 0.1cm depth; 6.283cm^2 area and 0.628cm^3 volume. The wound is limited to skin breakdown. There is no tunneling or undermining noted. There is a small amount of serous drainage noted. The wound margin is flat and intact. The periwound skin appearance exhibited: Ecchymosis. The periwound skin appearance did not exhibit: Callus, Crepitus, Excoriation, Fluctuance, Friable, Induration, Localized Edema, Rash, Scarring, Dry/Scaly, Maceration, Moist, Atrophie Blanche, Cyanosis, Hemosiderin Staining, Mottled, Pallor, Rubor, Erythema. Assessment Active Problems ICD-10 E11.621 - Type 2 diabetes mellitus with foot ulcer Z79.01 - Long term (current) use of anticoagulants [name], [name] ([id number]) S41.112A - Laceration without foreign body of left upper arm, initial encounter S41.102A - Unspecified open wound of left upper arm, initial  encounter Plan Wound Cleansing: Wound #7 Left,Anterior Forearm: Clean wound with Normal Saline. - in clinic Cleanse wound with mild soap and water Wound #9 Left Upper Arm: Clean wound with Normal Saline. - in clinic Cleanse wound with mild soap and water Anesthetic: Wound #7 Left,Anterior Forearm: Topical Lidocaine 4% cream applied to wound bed prior to debridement Wound #9 Left Upper Arm: Topical Lidocaine 4% cream applied to wound bed prior to debridement Skin Barriers/Peri-Wound Care: Wound #7 Left,Anterior Forearm: Skin Prep Wound #9 Left Upper Arm: Skin Prep Primary Wound Dressing: Wound #7 Left,Anterior Forearm: Hydrafera Blue Mepitel One Wound #9 Left Upper Arm: Mepitel One Secondary Dressing: Wound #7 Left,Anterior Forearm: Conform/Kerlix - Stretch net to secure Wound #9 Left Upper Arm: Conform/Kerlix - Stretch net to secure Non-adherent pad Dressing Change Frequency: Wound #7 Left,Anterior Forearm: Change dressing every week Wound #9 Left Upper Arm: Change dressing every week Follow-up Appointments: Wound #7 Left,Anterior Forearm: Return Appointment in 1 week. Wound #9 Left Upper Arm: Return Appointment in 1 week. Additional Orders / Instructions: [name], [name] ([id number]) Wound #7 Left,Anterior Forearm: Increase protein intake. Activity as tolerated - VItamin C, A, ZINC, MVI Home Health: Wound #7 Left,Anterior Forearm: Continue Home Health Visits - [place] services this week, leave dressing in place. Home Health Nurse may visit PRN to address patient s wound care needs. FACE TO FACE ENCOUNTER: MEDICARE and MEDICAID PATIENTS: I certify that this patient is under my care and that I had a face-to-face encounter that meets the physician face-to-face encounter requirements with this patient on this date. The encounter with the patient was in whole or in part for the following MEDICAL CONDITION: (primary reason for [place]) MEDICAL  NECESSITY: I certify, that based on my findings, NURSING services are a medically necessary home health service. HOME BOUND STATUS: I certify that my clinical findings support that this patient is homebound (i.e., Due to illness or injury, pt requires aid of supportive devices such as crutches, cane, wheelchairs, walkers, the use of special transportation or the assistance of another person to leave their place of residence. There is a normal inability to leave the home and doing so requires considerable and taxing effort. Other absences are for medical reasons / religious services and are infrequent or of short duration when for other reasons). If current dressing causes regression in wound condition, may D/C ordered dressing product/s and apply Normal Saline Moist Dressing daily until next [place] / Other MD appointment. [place] of regression in wound condition at [phone]. Please direct any NON-WOUND related issues/requests for orders to patient's Primary Care Physician Wound #  9 Left Upper Arm: Continue Home Health Visits - [facility] services this week, leave dressing in place. Home Health Nurse may visit [facility] to address patient s wound care needs. FACE TO FACE ENCOUNTER: MEDICARE and MEDICAID PATIENTS: I certify that this patient is under my care and that I had a face-to-face encounter that meets the physician face-to-face encounter requirements with this patient on this date. The encounter with the patient was in whole or in part for the following MEDICAL CONDITION: (primary reason for [place]) MEDICAL NECESSITY: I certify, that based on my findings, NURSING services are a medically necessary home health service. HOME BOUND STATUS: I certify that my clinical findings support that this patient is homebound (i.e., Due to illness or injury, pt requires aid of supportive devices such as crutches, cane, wheelchairs, walkers, the use of special  transportation or the assistance of another person to leave their place of residence. There is a normal inability to leave the home and doing so requires considerable and taxing effort. Other absences are for medical reasons / religious services and are infrequent or of short duration when for other reasons). If current dressing causes regression in wound condition, may D/C ordered dressing product/s and apply Normal Saline Moist Dressing daily until next [facility] / Other MD appointment. [place] of regression in wound condition at [phone]. Please direct any NON-WOUND related issues/requests for orders to patient's Primary Care Physician The patient has had several unfortunate falls leading on to laceration and skin tears. These have been treated appropriately and we will see her back next week. Electronic Signature(s) [name], DEMLER ([id number]) Signed: 04/03/2016 3:51:39 PM By: [name], FACS Previous Signature: 04/03/2016 1:42:07 PM Version By: [name], FACS Entered By: [name] on 04/03/2016 15:51:39 [name] ([id number]) -------------------------------------------------------------------------------- SuperBill Details Patient Name: [name] Date of Service: 04/03/2016 Medical Record Number: [id number] Patient Account Number: [id number] Date of Birth/Sex: 1936/03/03 (80 y.o. Female) Treating RN: Cornell Barman Primary Care Physician: Halina Maidens Other Clinician: Referring Physician: Halina Maidens Treating Physician/Extender: Frann Rider in Treatment: 17 Diagnosis Coding ICD-10 Codes Code Description E11.621 Type 2 diabetes mellitus with foot ulcer Z79.01 Long term (current) use of anticoagulants S41.112A Laceration without foreign body of left upper arm, initial encounter S41.102A Unspecified open wound of left upper arm, initial encounter Facility Procedures CPT4 Code: [id number] Description:  99213 - WOUND CARE VISIT-LEV 3 EST PT Modifier: Quantity: 1 Physician Procedures CPT4 Code Description: [id number] - WC PHYS LEVEL 3 - EST PT ICD-10 Description Diagnosis E11.621 Type 2 diabetes mellitus with foot ulcer S41.112A Laceration without foreign body of left upper arm, S41.102A Unspecified open wound of left upper arm,  initial e Modifier: initial encount ncounter Quantity: 1 er Engineer, maintenance) Signed: 04/03/2016 1:42:21 PM By: [name], FACS Entered By: [name] on 04/03/2016 13:42:20

## 2016-04-04 NOTE — Progress Notes (Signed)
[name], [name] ([id number]) Visit Report for 04/03/2016 Arrival Information Details Patient Name: [name], [name] 04/03/2016 12:45 Date of Service: PM Medical Record [id number] Number: Patient Account Number: [id number] Oct 23, 1935 (80 y.o. Treating RN: Cornell Barman Date of Birth/Sex: Female) Other Clinician: Primary Care Physician: Halina Maidens Treating [name] Referring Physician: Halina Maidens Physician/Extender: Suella Grove in Treatment: 17 Visit Information History Since Last Visit Added or deleted any medications: No Patient Arrived: Wheel Chair Any new allergies or adverse reactions: No Arrival Time: 12:38 Had a fall or experienced change in Yes Accompanied By: husband and activities of daily living that may affect caregiver risk of falls: Transfer Assistance: Manual Signs or symptoms of abuse/neglect since last No Patient Identification Verified: Yes visito Secondary Verification Process Yes Has Dressing in Place as Prescribed: Yes Completed: Pain Present Now: No Patient Requires Transmission- No Based Precautions: Patient Has Alerts: Yes Patient Alerts: Patient on Blood Thinner Electronic Signature(s) Signed: 04/03/2016 5:06:45 PM By: Gretta Cool, RN, BSN, Kim RN, BSN Entered By: Gretta Cool, RN, BSN, Kim on 04/03/2016 12:38:29 [name] ([id number]) -------------------------------------------------------------------------------- Clinic Level of Care Assessment Details Patient Name: [name], [name] 04/03/2016 12:45 Date of Service: PM Medical Record [id number] Number: Patient Account Number: [id number] 1936/03/02 (80 y.o. Treating RN: Cornell Barman Date of Birth/Sex: Female) Other Clinician: Primary Care Physician: Halina Maidens Treating [name] Referring Physician: Halina Maidens Physician/Extender: Suella Grove in Treatment: 17 Clinic Level of Care Assessment Items TOOL 4 Quantity Score []  - Use when only an EandM is performed on FOLLOW-UP  visit 0 ASSESSMENTS - Nursing Assessment / Reassessment []  - Reassessment of Co-morbidities (includes updates in patient status) 0 X - Reassessment of Adherence to Treatment Plan 1 5 ASSESSMENTS - Wound and Skin Assessment / Reassessment []  - Simple Wound Assessment / Reassessment - one wound 0 X - Complex Wound Assessment / Reassessment - multiple wounds 3 5 []  - Dermatologic / Skin Assessment (not related to wound area) 0 ASSESSMENTS - Focused Assessment []  - Circumferential Edema Measurements - multi extremities 0 []  - Nutritional Assessment / Counseling / Intervention 0 []  - Lower Extremity Assessment (monofilament, tuning fork, pulses) 0 []  - Peripheral Arterial Disease Assessment (using hand held doppler) 0 ASSESSMENTS - Ostomy and/or Continence Assessment and Care []  - Incontinence Assessment and Management 0 []  - Ostomy Care Assessment and Management (repouching, etc.) 0 PROCESS - Coordination of Care []  - Simple Patient / Family Education for ongoing care 0 X - Complex (extensive) Patient / Family Education for ongoing care 1 20 []  - Staff obtains Programmer, systems, Records, Test Results / Process Orders 0 []  - Staff telephones HHA, Nursing Homes / Clarify orders / etc 0 PAHOUA, KALP ([id number]) []  - Routine Transfer to another Facility (non-emergent condition) 0 []  - Routine Hospital Admission (non-emergent condition) 0 []  - New Admissions / Biomedical engineer / Ordering NPWT, Apligraf, etc. 0 []  - Emergency Hospital Admission (emergent condition) 0 X - Simple Discharge Coordination 1 10 []  - Complex (extensive) Discharge Coordination 0 PROCESS - Special Needs []  - Pediatric / Minor Patient Management 0 []  - Isolation Patient Management 0 []  - Hearing / Language / Visual special needs 0 []  - Assessment of Community assistance (transportation, D/C planning, etc.) 0 []  - Additional assistance / Altered mentation 0 []  - Support Surface(s) Assessment (bed, cushion, seat,  etc.) 0 INTERVENTIONS - Wound Cleansing / Measurement []  - Simple Wound Cleansing - one wound 0 X - Complex Wound Cleansing - multiple wounds 2 5 X - Wound Imaging (photographs -  any number of wounds) 1 5 []  - Wound Tracing (instead of photographs) 0 []  - Simple Wound Measurement - one wound 0 X - Complex Wound Measurement - multiple wounds 2 5 INTERVENTIONS - Wound Dressings []  - Small Wound Dressing one or multiple wounds 0 X - Medium Wound Dressing one or multiple wounds 2 15 []  - Large Wound Dressing one or multiple wounds 0 []  - Application of Medications - topical 0 []  - Application of Medications - injection 0 [name], [name] ([id number]) INTERVENTIONS - Miscellaneous []  - External ear exam 0 []  - Specimen Collection (cultures, biopsies, blood, body fluids, etc.) 0 []  - Specimen(s) / Culture(s) sent or taken to Lab for analysis 0 []  - Patient Transfer (multiple staff / Harrel Lemon Lift / Similar devices) 0 []  - Simple Staple / Suture removal (25 or less) 0 []  - Complex Staple / Suture removal (26 or more) 0 []  - Hypo / Hyperglycemic Management (close monitor of Blood Glucose) 0 []  - Ankle / Brachial Index (ABI) - do not check if billed separately 0 X - Vital Signs 1 5 Has the patient been seen at the hospital within the last three years: Yes Total Score: 110 Level Of Care: New/Established - Level 3 Electronic Signature(s) Signed: 04/03/2016 5:06:45 PM By: Gretta Cool, RN, BSN, Kim RN, BSN Entered By: Gretta Cool, RN, BSN, Kim on 04/03/2016 13:32:44 [name] ([id number]) -------------------------------------------------------------------------------- Encounter Discharge Information Details Patient Name: [name], [name] 04/03/2016 12:45 Date of Service: PM Medical Record [id number] Number: Patient Account Number: [id number] 02/14/1936 (80 y.o. Treating RN: Cornell Barman Date of Birth/Sex: Female) Other Clinician: Primary Care Physician: Halina Maidens Treating [name] Referring Physician: Halina Maidens Physician/Extender: Suella Grove in Treatment: 17 Encounter Discharge Information Items Discharge Pain Level: 0 Discharge Condition: Stable Ambulatory Status: Wheelchair Discharge Destination: Home Transportation: Private Auto Accompanied By: self Schedule Follow-up Appointment: Yes Medication Reconciliation completed and provided to Patient/Care Yes Tom Macpherson: Provided on Clinical Summary of Care: 04/03/2016 Form Type Recipient Paper Patient LK Electronic Signature(s) Signed: 04/03/2016 5:06:45 PM By: Gretta Cool RN, BSN, Kim RN, BSN Previous Signature: 04/03/2016 1:29:06 PM Version By: Ruthine Dose Entered By: Gretta Cool RN, BSN, Kim on 04/03/2016 13:34:31 [name] ([id number]) -------------------------------------------------------------------------------- Multi Wound Chart Details Patient Name: [name], [name] 04/03/2016 12:45 Date of Service: PM Medical Record [id number] Number: Patient Account Number: [id number] [birth year] (80 y.o. Treating RN: Cornell Barman Date of Birth/Sex: Female) Other Clinician: Primary Care Physician: Halina Maidens Treating [name] Referring Physician: Halina Maidens Physician/Extender: Suella Grove in Treatment: 17 Vital Signs Height(in): 64 Pulse(bpm): 59 Weight(lbs): 110 Blood Pressure 156/59 (mmHg): Body Mass Index(BMI): 19 Temperature(F): 97.6 Respiratory Rate 16 (breaths/min): Photos: Wound Location: Left Forearm - Anterior Right Forearm Left Upper Arm Wounding Event: Trauma Trauma Trauma Primary Etiology: Trauma, Other Trauma, Other Trauma, Other Comorbid History: Chronic Obstructive Chronic Obstructive Chronic Obstructive Pulmonary Disease Pulmonary Disease Pulmonary Disease (COPD), Angina, (COPD), Angina, (COPD), Angina, Congestive Heart Failure, Congestive Heart Failure, Congestive Heart Failure, Type II Diabetes, Lupus Type II Diabetes, Lupus Type II Diabetes,  Lupus Erythematosus, Erythematosus, Erythematosus, Osteoarthritis, Dementia Osteoarthritis, Dementia Osteoarthritis, Dementia Date Acquired: 02/24/2016 03/21/2016 04/02/2016 Weeks of Treatment: 5 1 0 Wound Status: Open Open Open Measurements L x W x D 3x2x0.1 0.1x0.1x0.1 4x2x0.1 (cm) Area (cm) : 4.712 0.008 6.283 Volume (cm) : 0.471 0.001 0.628 % Reduction in Area: 31.40% 97.50% N/A % Reduction in Volume: 31.40% 96.80% N/A Classification: Partial Thickness Partial Thickness Partial Thickness Exudate Amount: Large Medium Small Exudate Type: Serosanguineous Serosanguineous Serous Exudate  Color: red, brown red, brown amber AUBRIANNA, ISHAQ ([id number]) Wound Margin: Distinct, outline attached Flat and Intact Flat and Intact Granulation Amount: Large (67-100%) None Present (0%) N/A Granulation Quality: Red, Hyper-granulation N/A N/A Necrotic Amount: Small (1-33%) Large (67-100%) N/A Necrotic Tissue: Eschar Eschar N/A Epithelialization: Medium (34-66%) Large (67-100%) Small (1-33%) Periwound Skin Texture: Scarring: Yes Scarring: Yes Edema: No Edema: No Excoriation: No Excoriation: No Induration: No Induration: No Callus: No Callus: No Crepitus: No Crepitus: No Fluctuance: No Fluctuance: No Friable: No Friable: No Rash: No Rash: No Scarring: No Periwound Skin Maceration: Yes Moist: Yes Maceration: No Moisture: Moist: Yes Maceration: No Moist: No Dry/Scaly: No Dry/Scaly: No Periwound Skin Color: No Abnormalities Noted Atrophie Blanche: No Ecchymosis: Yes Cyanosis: No Atrophie Blanche: No Ecchymosis: No Cyanosis: No Erythema: No Erythema: No Hemosiderin Staining: No Hemosiderin Staining: No Mottled: No Mottled: No Pallor: No Pallor: No Rubor: No Rubor: No Temperature: No Abnormality N/A N/A Tenderness on Yes No No Palpation: Wound Preparation: Ulcer Cleansing: Ulcer Cleansing: Ulcer Cleansing: Rinsed/Irrigated with Rinsed/Irrigated with  Rinsed/Irrigated with Saline Saline Saline Topical Anesthetic Topical Anesthetic Topical Anesthetic Applied: Other: lidocaine Applied: None Applied: Other: l.idocaine 4% 4% Treatment Notes Electronic Signature(s) Signed: 04/03/2016 5:06:45 PM By: Gretta Cool, RN, BSN, Kim RN, BSN Entered By: Gretta Cool, RN, BSN, Kim on 04/03/2016 12:58:14 [name] ([id number]) -------------------------------------------------------------------------------- [place] Details Patient Name: [name], [name] 04/03/2016 12:45 Date of Service: PM Medical Record [id number] Number: Patient Account Number: [id number] 29-May-1936 (81 y.o. Treating RN: Cornell Barman Date of Birth/Sex: Female) Other Clinician: Primary Care Physician: Halina Maidens Treating [name] Referring Physician: Halina Maidens Physician/Extender: Suella Grove in Treatment: 17 Active Inactive Abuse / Safety / Falls / Self Care Management Nursing Diagnoses: Potential for falls Goals: Patient/caregiver will verbalize understanding of skin care regimen Date Initiated: 12/02/2015 Goal Status: Active Patient/caregiver will verbalize/demonstrate measures taken to prevent injury and/or falls Date Initiated: 12/02/2015 Goal Status: Active Interventions: Assess fall risk on admission and as needed Assess: immobility, friction, shearing, incontinence upon admission and as needed Assess impairment of mobility on admission and as needed per policy Assess self care needs on admission and as needed Provide education on fall prevention Provide education on safe transfers Notes: Wound/Skin Impairment Nursing Diagnoses: Impaired tissue integrity Goals: Patient/caregiver will verbalize understanding of skin care regimen Date Initiated: 12/02/2015 Goal Status: Active Interventions: Assess patient/caregiver ability to obtain necessary supplies [name], [name] ([id number]) Assess patient/caregiver ability to perform ulcer/skin  care regimen upon admission and as needed Assess ulceration(s) every visit Provide education on ulcer and skin care Notes: Electronic Signature(s) Signed: 04/03/2016 5:06:45 PM By: Gretta Cool, RN, BSN, Kim RN, BSN Entered By: Gretta Cool, RN, BSN, Kim on 04/03/2016 12:54:54 [name] ([id number]) -------------------------------------------------------------------------------- Pain Assessment Details Patient Name: [name], [name] 04/03/2016 12:45 Date of Service: PM Medical Record [id number] Number: Patient Account Number: [id number] [birth year] (80 y.o. Treating RN: Cornell Barman Date of Birth/Sex: Female) Other Clinician: Primary Care Physician: Halina Maidens Treating [name] Referring Physician: Halina Maidens Physician/Extender: Suella Grove in Treatment: 17 Active Problems Location of Pain Severity and Description of Pain Patient Has Paino No Site Locations With Dressing Change: No Pain Management and Medication Current Pain Management: Electronic Signature(s) Signed: 04/03/2016 5:06:45 PM By: Gretta Cool, RN, BSN, Kim RN, BSN Entered By: Gretta Cool, RN, BSN, Kim on 04/03/2016 12:39:06 [name] ([id number]) -------------------------------------------------------------------------------- Patient/Caregiver Education Details Patient Name: [name], [name] 04/03/2016 12:45 Date of Service: PM Medical Record [id number] Number: Patient Account Number: [id number] Dec 16, 1935 (80 y.o. Treating RN:  Cornell Barman Date of Birth/Gender: Female) Other Clinician: Primary Care Physician: Halina Maidens Treating [name] Referring Physician: Halina Maidens Physician/Extender: Suella Grove in Treatment: 17 Education Assessment Education Provided To: Patient Education Topics Provided Wound/Skin Impairment: Handouts: Caring for Your Ulcer Methods: Demonstration Responses: State content correctly [employer]) Signed: 04/03/2016 5:06:45 PM By: Gretta Cool, RN, BSN, Kim RN,  BSN Entered By: Gretta Cool, RN, BSN, Kim on 04/03/2016 13:34:44 [name] ([id number]) -------------------------------------------------------------------------------- Wound Assessment Details Patient Name: [name], [name] 04/03/2016 12:45 Date of Service: PM Medical Record [id number] Number: Patient Account Number: [id number] [birth year] (80 y.o. Treating RN: Cornell Barman Date of Birth/Sex: Female) Other Clinician: Primary Care Physician: Halina Maidens Treating [name] Referring Physician: Halina Maidens Physician/Extender: Suella Grove in Treatment: 17 Wound Status Wound Number: 7 Primary Trauma, Other Etiology: Wound Location: Left Forearm - Anterior Wound Open Wounding Event: Trauma Status: Date Acquired: 02/24/2016 Comorbid Chronic Obstructive Pulmonary Disease Weeks Of Treatment: 5 History: (COPD), Angina, Congestive Heart Clustered Wound: No Failure, Type II Diabetes, Lupus Erythematosus, Osteoarthritis, Dementia Photos Wound Measurements Length: (cm) 3 Width: (cm) 2 Depth: (cm) 0.1 Area: (cm) 4.712 Volume: (cm) 0.471 % Reduction in Area: 31.4% % Reduction in Volume: 31.4% Epithelialization: Medium (34-66%) Tunneling: No Undermining: No Wound Description Classification: Partial Thickness Wound Margin: Distinct, outline attached Exudate Amount: Large Exudate Type: Serosanguineous Exudate Color: red, brown Foul Odor After Cleansing: No Wound Bed Granulation Amount: Large (67-100%) Granulation Quality: Red, Hyper-granulation Necrotic Amount: Small (1-33%) Necrotic Quality: GOLDIE, STEMPER ([id number]) Periwound Skin Texture Texture Color No Abnormalities Noted: No No Abnormalities Noted: No Scarring: Yes Temperature / Pain Moisture Temperature: No Abnormality No Abnormalities Noted: No Tenderness on Palpation: Yes Maceration: Yes Moist: Yes Wound Preparation Ulcer Cleansing: Rinsed/Irrigated with Saline Topical Anesthetic  Applied: Other: lidocaine 4%, Treatment Notes Wound #7 (Left, Anterior Forearm) 1. Cleansed with: Clean wound with Normal Saline 2. Anesthetic Topical Lidocaine 4% cream to wound bed prior to debridement 3. Peri-wound Care: Skin Prep 4. Dressing Applied: Mepitel 5. Secondary Dressing Applied Kerlix/Conform Notes Hydrofera blue on forearm; telfa on upper arm; stretch net to secure Electronic Signature(s) Signed: 04/03/2016 5:06:45 PM By: Gretta Cool, RN, BSN, Kim RN, BSN Entered By: Gretta Cool, RN, BSN, Kim on 04/03/2016 12:51:39 [name] ([id number]) -------------------------------------------------------------------------------- Wound Assessment Details Patient Name: [name], BROZOWSKI 04/03/2016 12:45 Date of Service: PM Medical Record [id number] Number: Patient Account Number: [id number] 16-Aug-1935 (80 y.o. Treating RN: Cornell Barman Date of Birth/Sex: Female) Other Clinician: Primary Care Physician: Halina Maidens Treating [name] Referring Physician: Halina Maidens Physician/Extender: Suella Grove in Treatment: 17 Wound Status Wound Number: 8 Primary Trauma, Other Etiology: Wound Location: Right Forearm Wound Healed - Epithelialized Wounding Event: Trauma Status: Date Acquired: 03/21/2016 Comorbid Chronic Obstructive Pulmonary Disease Weeks Of Treatment: 1 History: (COPD), Angina, Congestive Heart Clustered Wound: No Failure, Type II Diabetes, Lupus Erythematosus, Osteoarthritis, Dementia Photos Wound Measurements Length: (cm) 0 % Reduction in Width: (cm) 0 % Reduction in Depth: (cm) 0 Epithelializati Area: (cm) 0 Tunneling: Volume: (cm) 0 Undermining: Area: 100% Volume: 100% on: Large (67-100%) No No Wound Description Classification: Partial Thickness Wound Margin: Flat and Intact Exudate Amount: Medium Exudate Type: Serosanguineous Exudate Color: red, brown Wound Bed Granulation Amount: None Present (0%) Exposed Structure Necrotic Amount:  Large (67-100%) Fascia Exposed: No Necrotic Quality: Eschar Fat Layer Exposed: No Tendon Exposed: No DARBY, ANGELLE ([id number]) Muscle Exposed: No Joint Exposed: No Bone Exposed: No Limited to Skin Breakdown Periwound Skin Texture Texture Color No Abnormalities Noted: No No Abnormalities Noted: No  Callus: No Atrophie Blanche: No Crepitus: No Cyanosis: No Excoriation: No Ecchymosis: No Fluctuance: No Erythema: No Friable: No Hemosiderin Staining: No Induration: No Mottled: No Localized Edema: No Pallor: No Rash: No Rubor: No Scarring: Yes Moisture No Abnormalities Noted: No Dry / Scaly: No Maceration: No Moist: Yes Wound Preparation Ulcer Cleansing: Rinsed/Irrigated with Saline Topical Anesthetic Applied: None Electronic Signature(s) Signed: 04/03/2016 5:06:45 PM By: Gretta Cool, RN, BSN, Kim RN, BSN Entered By: Gretta Cool, RN, BSN, Kim on 04/03/2016 13:16:52 [name] ([id number]) -------------------------------------------------------------------------------- Wound Assessment Details Patient Name: GENOLA, POW 04/03/2016 12:45 Date of Service: PM Medical Record [id number] Number: Patient Account Number: [id number] [birth year] (80 y.o. Treating RN: Cornell Barman Date of Birth/Sex: Female) Other Clinician: Primary Care Physician: Halina Maidens Treating [name] Referring Physician: Halina Maidens Physician/Extender: Suella Grove in Treatment: 17 Wound Status Wound Number: 9 Primary Trauma, Other Etiology: Wound Location: Left Upper Arm Wound Open Wounding Event: Trauma Status: Date Acquired: 04/02/2016 Comorbid Chronic Obstructive Pulmonary Disease Weeks Of Treatment: 0 History: (COPD), Angina, Congestive Heart Clustered Wound: No Failure, Type II Diabetes, Lupus Erythematosus, Osteoarthritis, Dementia Photos Wound Measurements Length: (cm) 4 % Reduction in Area Width: (cm) 2 % Reduction in Volu Depth: (cm) 0.1 Epithelialization: Area:  (cm) 6.283 Tunneling: Volume: (cm) 0.628 Undermining: : me: Small (1-33%) No No Wound Description Classification: Partial Thickness Foul Odor After Cle Wound Margin: Flat and Intact Exudate Amount: Small Exudate Type: Serous Exudate Color: amber ansing: No Wound Bed Exposed Structure Fascia Exposed: No Fat Layer Exposed: No Tendon Exposed: No MALAAK, EBELING ([id number]) Muscle Exposed: No Joint Exposed: No Bone Exposed: No Limited to Skin Breakdown Periwound Skin Texture Texture Color No Abnormalities Noted: No No Abnormalities Noted: No Callus: No Atrophie Blanche: No Crepitus: No Cyanosis: No Excoriation: No Ecchymosis: Yes Fluctuance: No Erythema: No Friable: No Hemosiderin Staining: No Induration: No Mottled: No Localized Edema: No Pallor: No Rash: No Rubor: No Scarring: No Moisture No Abnormalities Noted: No Dry / Scaly: No Maceration: No Moist: No Wound Preparation Ulcer Cleansing: Rinsed/Irrigated with Saline Topical Anesthetic Applied: Other: l.idocaine 4%, Treatment Notes Wound #9 (Left Upper Arm) 1. Cleansed with: Clean wound with Normal Saline 2. Anesthetic Topical Lidocaine 4% cream to wound bed prior to debridement 3. Peri-wound Care: Skin Prep 4. Dressing Applied: Mepitel 5. Secondary Dressing Applied Kerlix/Conform Notes Hydrofera blue on forearm; telfa on upper arm; stretch net to secure Electronic Signature(s) Signed: 04/03/2016 5:06:45 PM By: Gretta Cool, RN, BSN, Kim RN, BSN Entered By: Gretta Cool, RN, BSN, Kim on 04/03/2016 12:50:32 [name] ([id number]) [name] ([id number]) -------------------------------------------------------------------------------- [place] Details Patient Name: AILANI, FAHIM 04/03/2016 12:45 Date of Service: PM Medical Record [id number] Number: Patient Account Number: [id number] 09-08-1935 (80 y.o. Treating RN: Cornell Barman Date of Birth/Sex: Female) Other Clinician: Primary  Care Physician: Halina Maidens Treating [name] Referring Physician: Halina Maidens Physician/Extender: Suella Grove in Treatment: 17 Vital Signs Time Taken: 12:39 Temperature (F): 97.6 Height (in): 64 Pulse (bpm): 59 Weight (lbs): 110 Respiratory Rate (breaths/min): 16 Body Mass Index (BMI): 18.9 Blood Pressure (mmHg): 156/59 Reference Range: 80 - 120 mg / dl Electronic Signature(s) Signed: 04/03/2016 5:06:45 PM By: Gretta Cool, RN, BSN, Kim RN, BSN Entered By: Gretta Cool, RN, BSN, Kim on 04/03/2016 12:39:38

## 2016-04-04 NOTE — Telephone Encounter (Signed)
Pts husband called said pt is out of her Glimepiride 2mg  pt is using [facility] in [place].

## 2016-04-07 ENCOUNTER — Encounter (INDEPENDENT_AMBULATORY_CARE_PROVIDER_SITE_OTHER): Payer: Medicare Other | Admitting: Internal Medicine

## 2016-04-07 DIAGNOSIS — F0280 Dementia in other diseases classified elsewhere without behavioral disturbance: Secondary | ICD-10-CM

## 2016-04-07 DIAGNOSIS — F0150 Vascular dementia without behavioral disturbance: Secondary | ICD-10-CM | POA: Diagnosis not present

## 2016-04-07 DIAGNOSIS — G309 Alzheimer's disease, unspecified: Secondary | ICD-10-CM | POA: Diagnosis not present

## 2016-04-07 DIAGNOSIS — E1122 Type 2 diabetes mellitus with diabetic chronic kidney disease: Secondary | ICD-10-CM

## 2016-04-07 DIAGNOSIS — I480 Paroxysmal atrial fibrillation: Secondary | ICD-10-CM

## 2016-04-07 DIAGNOSIS — J9611 Chronic respiratory failure with hypoxia: Secondary | ICD-10-CM

## 2016-04-07 DIAGNOSIS — I5032 Chronic diastolic (congestive) heart failure: Secondary | ICD-10-CM

## 2016-04-07 DIAGNOSIS — F419 Anxiety disorder, unspecified: Secondary | ICD-10-CM

## 2016-04-07 DIAGNOSIS — M329 Systemic lupus erythematosus, unspecified: Secondary | ICD-10-CM

## 2016-04-07 DIAGNOSIS — N181 Chronic kidney disease, stage 1: Secondary | ICD-10-CM

## 2016-04-07 NOTE — Progress Notes (Signed)
Received orders from [facility] in [place].  Re-certification dates [id number] through 05/23/16.  Orders are reviewed, signed and faxed.

## 2016-04-10 ENCOUNTER — Encounter: Payer: Medicare Other | Admitting: Surgery

## 2016-04-10 DIAGNOSIS — Z7901 Long term (current) use of anticoagulants: Secondary | ICD-10-CM | POA: Diagnosis not present

## 2016-04-10 DIAGNOSIS — S41102A Unspecified open wound of left upper arm, initial encounter: Secondary | ICD-10-CM | POA: Diagnosis not present

## 2016-04-10 DIAGNOSIS — J449 Chronic obstructive pulmonary disease, unspecified: Secondary | ICD-10-CM | POA: Diagnosis not present

## 2016-04-10 DIAGNOSIS — S41112A Laceration without foreign body of left upper arm, initial encounter: Secondary | ICD-10-CM | POA: Diagnosis not present

## 2016-04-10 DIAGNOSIS — L89613 Pressure ulcer of right heel, stage 3: Secondary | ICD-10-CM | POA: Diagnosis not present

## 2016-04-10 DIAGNOSIS — E11621 Type 2 diabetes mellitus with foot ulcer: Secondary | ICD-10-CM | POA: Diagnosis not present

## 2016-04-10 DIAGNOSIS — S51802A Unspecified open wound of left forearm, initial encounter: Secondary | ICD-10-CM | POA: Diagnosis not present

## 2016-04-10 NOTE — Progress Notes (Signed)
Madison, [name] ([id number]) Visit Report for 04/10/2016 Arrival Information Details Patient Name: Madison, [name] 04/10/2016 12:45 Date of Service: PM Medical Record [id number] Number: Patient Account Number: [id number] [birth year] (80 Santanao. Treating RN: Cornell Barman Date of Birth/Sex: Female) Other Clinician: Primary Care Physician: Halina Maidens Treating [name] Referring Physician: Halina Maidens Physician/Extender: Suella Grove in Treatment: 18 Visit Information History Since Last Visit Added or deleted any medications: No Patient Arrived: Wheel Chair Any new allergies or adverse reactions: No Arrival Time: 12:47 Had a fall or experienced change in No Accompanied By: caregiver and activities of daily living that may affect husband risk of falls: Transfer Assistance: Manual Signs or symptoms of abuse/neglect since last No Patient Identification Verified: Yes visito Secondary Verification Process Yes Hospitalized since last visit: No Completed: Has Dressing in Place as Prescribed: Yes Patient Requires Transmission- No Pain Present Now: No Based Precautions: Patient Has Alerts: Yes Patient Alerts: Patient on Blood Thinner Electronic Signature(s) Signed: 04/10/2016 3:33:39 PM By: Gretta Cool, RN, BSN, Kim RN, BSN Entered By: Gretta Cool, RN, BSN, Kim on 04/10/2016 12:47:57 [name] ([id number]) -------------------------------------------------------------------------------- Encounter Discharge Information Details Patient Name: [name] 04/10/2016 12:45 Date of Service: PM Medical Record [id number] Number: Patient Account Number: [id number] [birth year] (80 Santanao. Treating RN: Cornell Barman Date of Birth/Sex: Female) Other Clinician: Primary Care Physician: Halina Maidens Treating [name] Referring Physician: Halina Maidens Physician/Extender: Suella Grove in Treatment: 18 Encounter Discharge Information Items Discharge Pain Level: 0 Discharge Condition:  Stable Ambulatory Status: Wheelchair Discharge Destination: Home Transportation: Private Auto caregiver and Accompanied By: husband Schedule Follow-up Appointment: Yes Medication Reconciliation completed and provided to Patient/Care Yes Grisell Bissette: Provided on Clinical Summary of Care: 04/10/2016 Form Type Recipient Paper Patient LK Electronic Signature(s) Signed: 04/10/2016 3:33:39 PM By: Gretta Cool RN, BSN, Kim RN, BSN Previous Signature: 04/10/2016 1:27:19 PM Version By: Ruthine Dose Entered By: Gretta Cool RN, BSN, Kim on 04/10/2016 13:31:29 [name] ([id number]) -------------------------------------------------------------------------------- Multi Wound Chart Details Patient Name: [name] 04/10/2016 12:45 Date of Service: PM Medical Record [id number] Number: Patient Account Number: [id number] 1935-12-15 (80 Santanao. Treating RN: Cornell Barman Date of Birth/Sex: Female) Other Clinician: Primary Care Physician: Halina Maidens Treating [name] Referring Physician: Halina Maidens Physician/Extender: Suella Grove in Treatment: 18 Vital Signs Height(in): 64 Pulse(bpm): 57 Weight(lbs): 110 Blood Pressure 154/55 (mmHg): Body Mass Index(BMI): 19 Temperature(F): 97.5 Respiratory Rate 16 (breaths/min): Photos: [N/A:N/A] Wound Location: Left Forearm - Anterior Left Upper Arm N/A Wounding Event: Trauma Trauma N/A Primary Etiology: Trauma, Other Trauma, Other N/A Comorbid History: Chronic Obstructive Chronic Obstructive N/A Pulmonary Disease Pulmonary Disease (COPD), Angina, (COPD), Angina, Congestive Heart Failure, Congestive Heart Failure, Type II Diabetes, Lupus Type II Diabetes, Lupus Erythematosus, Erythematosus, Osteoarthritis, Dementia Osteoarthritis, Dementia Date Acquired: 02/24/2016 04/02/2016 N/A Weeks of Treatment: 6 1 N/A Wound Status: Open Open N/A Measurements L x W x D 2x1x0.1 3x1.5x0.1 N/A (cm) Area (cm) : 1.571 3.534 N/A Volume (cm) :  0.157 0.353 N/A % Reduction in Area: 77.10% 43.80% N/A % Reduction in Volume: 77.10% 43.80% N/A Classification: Partial Thickness Partial Thickness N/A Exudate Amount: Large Small N/A Exudate Type: Serosanguineous Serous N/A Exudate Color: red, brown amber Madison, [name] ([id number]) Wound Margin: Distinct, outline attached Flat and Intact N/A Granulation Amount: Large (67-100%) Medium (34-66%) N/A Granulation Quality: Red, Hyper-granulation Pale N/A Necrotic Amount: None Present (0%) Small (1-33%) N/A Epithelialization: Medium (34-66%) Small (1-33%) N/A Periwound Skin Texture: Excoriation: Yes Excoriation: Yes N/A Edema: No Edema: No Induration: No Induration: No Callus: No Callus: No Crepitus:  No Crepitus: No Fluctuance: No Fluctuance: No Friable: No Friable: No Rash: No Rash: No Scarring: No Scarring: No Periwound Skin Moist: Yes Moist: Yes N/A Moisture: Maceration: No Maceration: No Dry/Scaly: No Dry/Scaly: No Periwound Skin Color: Erythema: Yes Atrophie Blanche: No N/A Atrophie Blanche: No Cyanosis: No Cyanosis: No Ecchymosis: No Ecchymosis: No Erythema: No Hemosiderin Staining: No Hemosiderin Staining: No Mottled: No Mottled: No Pallor: No Pallor: No Rubor: No Rubor: No Temperature: No Abnormality N/A N/A Tenderness on Yes No N/A Palpation: Wound Preparation: Ulcer Cleansing: Ulcer Cleansing: N/A Rinsed/Irrigated with Rinsed/Irrigated with Saline Saline Topical Anesthetic Applied: Other: l.idocaine 4% Treatment Notes Electronic Signature(s) Signed: 04/10/2016 3:33:39 PM By: Gretta Cool, RN, BSN, Kim RN, BSN Entered By: Gretta Cool, RN, BSN, Kim on 04/10/2016 13:14:01 [name] ([id number]) -------------------------------------------------------------------------------- [place] Details Patient Name: [name] 04/10/2016 12:45 Date of Service: PM Medical Record [id number] Number: Patient Account Number:  [id number] [birth year] (80 Santanao. Treating RN: Cornell Barman Date of Birth/Sex: Female) Other Clinician: Primary Care Physician: Halina Maidens Treating [name] Referring Physician: Halina Maidens Physician/Extender: Suella Grove in Treatment: 61 Active Inactive Abuse / Safety / Falls / Self Care Management Nursing Diagnoses: Potential for falls Goals: Patient/caregiver will verbalize understanding of skin care regimen Date Initiated: 12/02/2015 Goal Status: Active Patient/caregiver will verbalize/demonstrate measures taken to prevent injury and/or falls Date Initiated: 12/02/2015 Goal Status: Active Interventions: Assess fall risk on admission and as needed Assess: immobility, friction, shearing, incontinence upon admission and as needed Assess impairment of mobility on admission and as needed per policy Assess self care needs on admission and as needed Provide education on fall prevention Provide education on safe transfers Notes: Wound/Skin Impairment Nursing Diagnoses: Impaired tissue integrity Goals: Patient/caregiver will verbalize understanding of skin care regimen Date Initiated: 12/02/2015 Goal Status: Active Interventions: Assess patient/caregiver ability to obtain necessary supplies Madison, [name] ([id number]) Assess patient/caregiver ability to perform ulcer/skin care regimen upon admission and as needed Assess ulceration(s) every visit Provide education on ulcer and skin care Notes: Electronic Signature(s) Signed: 04/10/2016 3:33:39 PM By: Gretta Cool, RN, BSN, Kim RN, BSN Entered By: Gretta Cool, RN, BSN, Kim on 04/10/2016 13:13:52 [name] ([id number]) -------------------------------------------------------------------------------- Pain Assessment Details Patient Name: BIDDIE, FASSBENDER 04/10/2016 12:45 Date of Service: PM Medical Record [id number] Number: Patient Account Number: [id number] 12-07-1935 (72 Santanao. Treating RN: Cornell Barman Date of  Birth/Sex: Female) Other Clinician: Primary Care Physician: Halina Maidens Treating [name] Referring Physician: Halina Maidens Physician/Extender: Suella Grove in Treatment: 18 Active Problems Location of Pain Severity and Description of Pain Patient Has Paino No Site Locations With Dressing Change: No Pain Management and Medication Current Pain Management: Electronic Signature(s) Signed: 04/10/2016 3:33:39 PM By: Gretta Cool, RN, BSN, Kim RN, BSN Entered By: Gretta Cool, RN, BSN, Kim on 04/10/2016 12:48:04 [name] ([id number]) -------------------------------------------------------------------------------- Patient/Caregiver Education Details Patient Name: Madison, [name] 04/10/2016 12:45 Date of Service: PM Medical Record [id number] Number: Patient Account Number: [id number] 1935-07-10 (76 Santanao. Treating RN: Cornell Barman Date of Birth/Gender: Female) Other Clinician: Primary Care Physician: Halina Maidens Treating [name] Referring Physician: Halina Maidens Physician/Extender: Suella Grove in Treatment: 18 Education Assessment Education Provided To: Patient Education Topics Provided Wound/Skin Impairment: Handouts: Caring for Your Ulcer, Other: leave dressing in place until next wound care visit; do not get wet Methods: Demonstration, Explain/Verbal Responses: State content correctly Electronic Signature(s) Signed: 04/10/2016 3:33:39 PM By: Gretta Cool, RN, BSN, Kim RN, BSN Entered By: Gretta Cool, RN, BSN, Kim on 04/10/2016 13:32:05 [name] ([id number]) -------------------------------------------------------------------------------- Wound Assessment Details Patient Name: [name],  Madison Parents Y. 04/10/2016 12:45 Date of Service: PM Medical Record [id number] Number: Patient Account Number: [id number] 20-Aug-1935 (40 Santanao. Treating RN: Cornell Barman Date of Birth/Sex: Female) Other Clinician: Primary Care Physician: Halina Maidens Treating [name] Referring Physician:  Halina Maidens Physician/Extender: Suella Grove in Treatment: 18 Wound Status Wound Number: 7 Primary Trauma, Other Etiology: Wound Location: Left Forearm - Anterior Wound Open Wounding Event: Trauma Status: Date Acquired: 02/24/2016 Comorbid Chronic Obstructive Pulmonary Disease Weeks Of Treatment: 6 History: (COPD), Angina, Congestive Heart Clustered Wound: No Failure, Type II Diabetes, Lupus Erythematosus, Osteoarthritis, Dementia Photos Wound Measurements Length: (cm) 2 Width: (cm) 1 Depth: (cm) 0.1 Area: (cm) 1.571 Volume: (cm) 0.157 % Reduction in Area: 77.1% % Reduction in Volume: 77.1% Epithelialization: Medium (34-66%) Tunneling: No Undermining: No Wound Description Classification: Partial Thickness Wound Margin: Distinct, outline attached Exudate Amount: Large Exudate Type: Serosanguineous Exudate Color: red, brown Foul Odor After Cleansing: No Wound Bed Granulation Amount: Large (67-100%) Granulation Quality: Red, Hyper-granulation Necrotic Amount: None Present (0%) Periwound Skin Texture Madison, [name] ([id number]) Texture Color No Abnormalities Noted: No No Abnormalities Noted: No Callus: No Atrophie Blanche: No Crepitus: No Cyanosis: No Excoriation: Yes Ecchymosis: No Fluctuance: No Erythema: Yes Friable: No Hemosiderin Staining: No Induration: No Mottled: No Localized Edema: No Pallor: No Rash: No Rubor: No Scarring: No Temperature / Pain Moisture Temperature: No Abnormality No Abnormalities Noted: No Tenderness on Palpation: Yes Dry / Scaly: No Maceration: No Moist: Yes Wound Preparation Ulcer Cleansing: Rinsed/Irrigated with Saline Treatment Notes Wound #7 (Left, Anterior Forearm) 1. Cleansed with: Clean wound with Normal Saline 2. Anesthetic Topical Lidocaine 4% cream to wound bed prior to debridement 4. Dressing Applied: Hydrafera Blue Mepitel 5. Secondary Dressing Applied Gauze and Kerlix/Conform Notes Secured with  Event organiser) Signed: 04/10/2016 3:33:39 PM By: Gretta Cool, RN, BSN, Kim RN, BSN Entered By: Gretta Cool, RN, BSN, Kim on 04/10/2016 13:02:14 [name] ([id number]) -------------------------------------------------------------------------------- Wound Assessment Details Patient Name: Madison, [name] 04/10/2016 12:45 Date of Service: PM Medical Record [id number] Number: Patient Account Number: [id number] 1936/06/11 (23 [place]. Treating RN: Cornell Barman Date of Birth/Sex: Female) Other Clinician: Primary Care Physician: Halina Maidens Treating [name] Referring Physician: Halina Maidens Physician/Extender: Suella Grove in Treatment: 18 Wound Status Wound Number: 9 Primary Trauma, Other Etiology: Wound Location: Left Upper Arm Wound Open Wounding Event: Trauma Status: Date Acquired: 04/02/2016 Comorbid Chronic Obstructive Pulmonary Disease Weeks Of Treatment: 1 History: (COPD), Angina, Congestive Heart Clustered Wound: No Failure, Type II Diabetes, Lupus Erythematosus, Osteoarthritis, Dementia Photos Wound Measurements Length: (cm) 3 % Reduction in Area Width: (cm) 1.5 % Reduction in Volu Depth: (cm) 0.1 Epithelialization: Area: (cm) 3.534 Tunneling: Volume: (cm) 0.353 Undermining: : 43.8% me: 43.8% Small (1-33%) No No Wound Description Classification: Partial Thickness Foul Odor After Cle Wound Margin: Flat and Intact Exudate Amount: Small Exudate Type: Serous Exudate Color: amber ansing: No Wound Bed Granulation Amount: Medium (34-66%) Exposed Structure Granulation Quality: Pale Fascia Exposed: No Necrotic Amount: Small (1-33%) Fat Layer Exposed: No Necrotic Quality: Adherent Slough Tendon Exposed: No Madison, [name] ([id number]) Muscle Exposed: No Joint Exposed: No Bone Exposed: No Limited to Skin Breakdown Periwound Skin Texture Texture Color No Abnormalities Noted: No No Abnormalities Noted: No Callus: No Atrophie Blanche:  No Crepitus: No Cyanosis: No Excoriation: Yes Ecchymosis: No Fluctuance: No Erythema: No Friable: No Hemosiderin Staining: No Induration: No Mottled: No Localized Edema: No Pallor: No Rash: No Rubor: No Scarring: No Moisture No Abnormalities Noted: No Dry / Scaly: No Maceration: No Moist: Yes Wound  Preparation Ulcer Cleansing: Rinsed/Irrigated with Saline Topical Anesthetic Applied: Other: l.idocaine 4%, Treatment Notes Wound #9 (Left Upper Arm) 1. Cleansed with: Clean wound with Normal Saline 2. Anesthetic Topical Lidocaine 4% cream to wound bed prior to debridement 4. Dressing Applied: Hydrafera Blue Mepitel 5. Secondary Dressing Applied Gauze and Kerlix/Conform Notes Secured with Event organiser) Signed: 04/10/2016 3:33:39 PM By: Gretta Cool, RN, BSN, Kim RN, BSN Entered By: Gretta Cool, RN, BSN, Kim on 04/10/2016 13:03:21 [name] ([id number]) -------------------------------------------------------------------------------- [place] Details Patient Name: Madison, [name] 04/10/2016 12:45 Date of Service: PM Medical Record [id number] Number: Patient Account Number: [id number] [birth year] (59 Sant[place]o. Treating RN: Cornell Barman Date of Birth/Sex: Female) Other Clinician: Primary Care Physician: Halina Maidens Treating [name] Referring Physician: Halina Maidens Physician/Extender: Suella Grove in Treatment: 18 Vital Signs Time Taken: 12:51 Temperature (F): 97.5 Height (in): 64 Pulse (bpm): 57 Weight (lbs): 110 Respiratory Rate (breaths/min): 16 Body Mass Index (BMI): 18.9 Blood Pressure (mmHg): 154/55 Reference Range: 80 - 120 mg / dl Electronic Signature(s) Signed: 04/10/2016 3:33:39 PM By: Gretta Cool, RN, BSN, Kim RN, BSN Entered By: Gretta Cool, RN, BSN, Kim on 04/10/2016 12:52:08

## 2016-04-10 NOTE — Progress Notes (Signed)
AYSHA, LANDRO ([id number]) Visit Report for 04/10/2016 Chief Complaint Document Details Patient Name: [name], [name] 04/10/2016 12:45 Date of Service: PM Medical Record [id number] Number: Patient Account Number: [id number] Jul 08, 1935 (80 y.o. Treating RN: Cornell Barman Date of Birth/Sex: Female) Other Clinician: Primary Care Treating Army Melia Berneta Levins Physician: Physician/Extender: Referring Physician: Evert Kohl in Treatment: 18 Information Obtained from: Patient Chief Complaint Patient is at the clinic for treatment of an open pressure ulcer to her right calcaneum which has been there for about 4 weeks Electronic Signature(s) Signed: 04/10/2016 1:23:04 PM By: [name], FACS Entered By: [name] on 04/10/2016 13:23:04 [name] ([id number]) -------------------------------------------------------------------------------- HPI Details Patient Name: Madison, [name] 04/10/2016 12:45 Date of Service: PM Medical Record [id number] Number: Patient Account Number: [id number] 21-Sep-1935 (80 y.o. Treating RN: Cornell Barman Date of Birth/Sex: Female) Other Clinician: Primary Care Treating Army Melia Berneta Levins Physician: Physician/Extender: Referring Physician: Evert Kohl in Treatment: 18 History of Present Illness Location: right posterior heel, and both lower extremities Quality: Patient reports experiencing a sharp pain to affected area(s). Severity: Patient states wound (s) are getting better. Duration: Patient has had the wound for < 4 weeks prior to presenting for treatment Timing: Pain in wound is Intermittent (comes and goes Context: The wound appeared gradually over time while she was in hospital with a right hip fracture Modifying Factors: Consults to this date include:was seen by her PCP and put on some antibiotics a while ago Associated Signs and Symptoms: Patient reports having difficulty standing for  long periods. HPI Description: 80 year old patient referred who was recently discharged in April of this year with a left heel ulcer now comes with a right heel ulcer and some ulcerations in both lower extremities which have all come along during her recent hospitalization which she had a right hip fracture. She has a past medical history of COPD, diabetes mellitus type 2 with chronic kidney disease, multi-infarct dementia, anxiety, urinary retention, paroxysmal atrial fibrillation. She has also had moderate malnutrition, multi-infarct dementia, congestive heart failure, left displaced femoral neck fracture, systemic lupus erythematosus. She is also status total abdominal hysterectomy with bilateral salpingo-oophorectomy, EGDs, hip arthroplasty on the left, cataract surgery. She has been a from a smoker and quit in May 2013 and she smoked for about 40 years. recently admitted to [facility] between January 19 and 07/03/2015 12/09/2015 -- o right foot x-ray -- IMPRESSION:Soft tissue ulceration of the heel. No underlying erosive bony abnormality. Diffuse degenerative change. No acute bony abnormality. 12/30/2015-- admitted to the hospital between July 12 and July 18 of 2017 and was treated for cerebral infarction, infected decubitus ulcer and aspiration pneumonia of the right lung. MRI was negative for stroke and was thought to be secondary to pneumonia. She was treated with clindamycin. Patient was discharged home on Keflex and Zyvox. Dr. Lucky Cowboy took her for an aortogram and a right lower extremity runoff and found to 30% stenosis in the mid to distal SFA but no significant stenosis in the popliteal artery and the SFA. There was a two-vessel runoff through the anterior tibial artery and peroneal artery without significant stenosis. felt her perfusion was adequate for wound healing. 02/28/2016 -- days ago she had a lacerated wound on her left forearm which they have been treating  locally and have asked me to take a look today. 03/13/16: returns today for f/u. the right posterior heel wound has achieved epithelialization. the left forearm SAHIRAH, CHAUMONT ([id number]) wound is healing  without issue. no systemic s/s of infection. 03/20/16: returns today for f/u of a left forearm wound. husband reports dressing slides down. no reports of systemic s/s of infection. no new wounds or skin breakdown. 04/03/2016 -- she has had another laceration with a skin flap on the left upper arm and she also has one on the anterior part of the left forearm. Electronic Signature(s) Signed: 04/10/2016 1:23:13 PM By: [name], FACS Entered By: [name] on 04/10/2016 13:23:13 [name] ([id number]) -------------------------------------------------------------------------------- Physical Exam Details Patient Name: Madison, [name] 04/10/2016 12:45 Date of Service: PM Medical Record [id number] Number: Patient Account Number: [id number] Mar 19, 1936 (80 y.o. Treating RN: Cornell Barman Date of Birth/Sex: Female) Other Clinician: Primary Care Treating Army Melia Berneta Levins Physician: Physician/Extender: Referring Physician: Evert Kohl in Treatment: 18 Constitutional . Pulse regular. Respirations normal and unlabored. Afebrile. . Eyes Nonicteric. Reactive to light. Ears, Nose, Mouth, and Throat Lips, teeth, and gums WNL.Marland Kitchen Moist mucosa without lesions. Neck supple and nontender. No palpable supraclavicular or cervical adenopathy. Normal sized without goiter. Respiratory WNL. No retractions.. Cardiovascular Pedal Pulses WNL. No clubbing, cyanosis or edema. Lymphatic No adneopathy. No adenopathy. No adenopathy. Musculoskeletal Adexa without tenderness or enlargement.. Digits and nails w/o clubbing, cyanosis, infection, petechiae, ischemia, or inflammatory conditions.. Integumentary (Hair, Skin) No suspicious lesions. No crepitus or fluctuance.  No peri-wound warmth or erythema. No masses.Marland Kitchen Psychiatric Judgement and insight Intact.. No evidence of depression, anxiety, or agitation.. Notes the wounds on the left upper arm are looking good continue with Mepitel and Hydrofera Blue and leave it in place for a week. Electronic Signature(s) Signed: 04/10/2016 1:24:22 PM By: [name], FACS Entered By: [name] on 04/10/2016 13:24:22 [name] ([id number]) -------------------------------------------------------------------------------- Physician Orders Details Patient Name: KATELEN, WINCHEL 04/10/2016 12:45 Date of Service: PM Medical Record [id number] Number: Patient Account Number: [id number] Dec 22, 1935 (80 y.o. Treating RN: Cornell Barman Date of Birth/Sex: Female) Other Clinician: Primary Care Treating Army Melia Berneta Levins Physician: Physician/Extender: Referring Physician: Evert Kohl in Treatment: 57 Verbal / Phone Orders: Yes Clinician: Cornell Barman Read Back and Verified: Yes Diagnosis Coding Wound Cleansing Wound #7 Left,Anterior Forearm o Clean wound with Normal Saline. Wound #9 Left Upper Arm o Clean wound with Normal Saline. Anesthetic Wound #7 Left,Anterior Forearm o Topical Lidocaine 4% cream applied to wound bed prior to debridement - in clinic Wound #9 Left Upper Arm o Topical Lidocaine 4% cream applied to wound bed prior to debridement - in clinic Primary Wound Dressing Wound #7 Left,Anterior Forearm o Mepitel One o Hydrafera Blue Wound #9 Left Upper Arm o Mepitel One o Hydrafera Blue Secondary Dressing Wound #7 Left,Anterior Forearm o Conform/Kerlix - secured with Coban Wound #9 Left Upper Arm o Conform/Kerlix - secured with Coban Dressing Change Frequency Wound #7 Left,Anterior Forearm o Other: - Do not touch dressing, leave in place. To be changed at next [place] appointment. ANGELYS, FLUM ([id number]) Wound #9 Left Upper  Arm o Other: - Do not touch dressing, leave in place. To be changed at next [place] appointment. Follow-up Appointments Wound #7 Left,Anterior Forearm o Return Appointment in 1 week. Wound #9 Left Upper Arm o Return Appointment in 1 week. Additional Orders / Instructions Wound #7 Left,Anterior Forearm o Other: - keep dressing dry. Wound #9 Left Upper Arm o Other: - keep dressing dry. Electronic Signature(s) Signed: 04/10/2016 3:33:39 PM By: Gretta Cool RN, BSN, Kim RN, BSN Signed: 04/10/2016 3:58:21 PM By: [name], FACS Entered By:  Gretta Cool, RN, BSN, Kim on 04/10/2016 13:16:59 [name] ([id number]) -------------------------------------------------------------------------------- Problem List Details Patient Name: JESSINIA, HALLAND 04/10/2016 12:45 Date of Service: PM Medical Record [id number] Number: Patient Account Number: [id number] Aug 23, 1935 (80 y.o. Treating RN: Cornell Barman Date of Birth/Sex: Female) Other Clinician: Primary Care Treating Army Melia Berneta Levins Physician: Physician/Extender: Referring Physician: Evert Kohl in Treatment: 18 Active Problems ICD-10 Encounter Code Description Active Date Diagnosis E11.621 Type 2 diabetes mellitus with foot ulcer 12/02/2015 Yes Z79.01 Long term (current) use of anticoagulants 12/02/2015 Yes S41.112A Laceration without foreign body of left upper arm, initial 02/28/2016 Yes encounter S41.102A Unspecified open wound of left upper arm, initial 02/28/2016 Yes encounter Inactive Problems Resolved Problems ICD-10 Code Description Active Date Resolved Date L89.613 Pressure ulcer of right heel, stage 3 12/02/2015 12/02/2015 L97.212 Non-pressure chronic ulcer of right calf with fat layer 12/02/2015 12/02/2015 exposed L97.211 Non-pressure chronic ulcer of right calf limited to 12/02/2015 12/02/2015 breakdown of skin PRYNCESS, SUEN ([id number]) Electronic Signature(s) Signed: 04/10/2016  1:22:53 PM By: [name], FACS Entered By: [name] on 04/10/2016 13:22:52 [name] ([id number]) -------------------------------------------------------------------------------- Progress Note Details Patient Name: [name] 04/10/2016 12:45 Date of Service: PM Medical Record [id number] Number: Patient Account Number: [id number] Feb 03, 1936 (80 y.o. Treating RN: Cornell Barman Date of Birth/Sex: Female) Other Clinician: Primary Care Treating Army Melia Berneta Levins Physician: Physician/Extender: Referring Physician: Evert Kohl in Treatment: 18 Subjective Chief Complaint Information obtained from Patient Patient is at the clinic for treatment of an open pressure ulcer to her right calcaneum which has been there for about 4 weeks History of Present Illness (HPI) The following HPI elements were documented for the patient's wound: Location: right posterior heel, and both lower extremities Quality: Patient reports experiencing a sharp pain to affected area(s). Severity: Patient states wound (s) are getting better. Duration: Patient has had the wound for < 4 weeks prior to presenting for treatment Timing: Pain in wound is Intermittent (comes and goes Context: The wound appeared gradually over time while she was in hospital with a right hip fracture Modifying Factors: Consults to this date include:was seen by her PCP and put on some antibiotics a while ago Associated Signs and Symptoms: Patient reports having difficulty standing for long periods. 80 year old patient referred who was recently discharged in April of this year with a left heel ulcer now comes with a right heel ulcer and some ulcerations in both lower extremities which have all come along during her recent hospitalization which she had a right hip fracture. She has a past medical history of COPD, diabetes mellitus type 2 with chronic kidney disease, multi-infarct dementia, anxiety,  urinary retention, paroxysmal atrial fibrillation. She has also had moderate malnutrition, multi-infarct dementia, congestive heart failure, left displaced femoral neck fracture, systemic lupus erythematosus. She is also status total abdominal hysterectomy with bilateral salpingo-oophorectomy, EGDs, hip arthroplasty on the left, cataract surgery. She has been a from a smoker and quit in May 2013 and she smoked for about 40 years. recently admitted to [facility] between January 19 and 07/03/2015 12/09/2015 -- right foot x-ray -- IMPRESSION:Soft tissue ulceration of the heel. No underlying erosive bony abnormality. Diffuse degenerative change. No acute bony abnormality. 12/30/2015-- admitted to the hospital between July 12 and July 18 of 2017 and was treated for cerebral infarction, infected decubitus ulcer and aspiration pneumonia of the right lung. MRI was negative for stroke and was thought to be secondary to pneumonia. She was treated with clindamycin.  Patient was discharged BLAINE, PASQUARELLI ([id number]) home on Keflex and Zyvox. Dr. Lucky Cowboy took her for an aortogram and a right lower extremity runoff and found to 30% stenosis in the mid to distal SFA but no significant stenosis in the popliteal artery and the SFA. There was a two-vessel runoff through the anterior tibial artery and peroneal artery without significant stenosis. felt her perfusion was adequate for wound healing. 02/28/2016 -- days ago she had a lacerated wound on her left forearm which they have been treating locally and have asked me to take a look today. 03/13/16: returns today for f/u. the right posterior heel wound has achieved epithelialization. the left forearm wound is healing without issue. no systemic s/s of infection. 03/20/16: returns today for f/u of a left forearm wound. husband reports dressing slides down. no reports of systemic s/s of infection. no new wounds or skin breakdown. 04/03/2016 --  she has had another laceration with a skin flap on the left upper arm and she also has one on the anterior part of the left forearm. Objective Constitutional Pulse regular. Respirations normal and unlabored. Afebrile. Vitals Time Taken: 12:51 PM, Height: 64 in, Weight: 110 lbs, BMI: 18.9, Temperature: 97.5 F, Pulse: 57 bpm, Respiratory Rate: 16 breaths/min, Blood Pressure: 154/55 mmHg. Eyes Nonicteric. Reactive to light. Ears, Nose, Mouth, and Throat Lips, teeth, and gums WNL.Marland Kitchen Moist mucosa without lesions. Neck supple and nontender. No palpable supraclavicular or cervical adenopathy. Normal sized without goiter. Respiratory WNL. No retractions.. Cardiovascular Pedal Pulses WNL. No clubbing, cyanosis or edema. Lymphatic No adneopathy. No adenopathy. No adenopathy. Musculoskeletal Adexa without tenderness or enlargement.. Digits and nails w/o clubbing, cyanosis, infection, petechiae, CARSHENA, SHAIKH. ([id number]) ischemia, or inflammatory conditions.Marland Kitchen Psychiatric Judgement and insight Intact.. No evidence of depression, anxiety, or agitation.. General Notes: the wounds on the left upper arm are looking good continue with Mepitel and Hydrofera Blue and leave it in place for a week. Integumentary (Hair, Skin) No suspicious lesions. No crepitus or fluctuance. No peri-wound warmth or erythema. No masses.. Wound #7 status is Open. Original cause of wound was Trauma. The wound is located on the Left,Anterior Forearm. The wound measures 2cm length x 1cm width x 0.1cm depth; 1.571cm^2 area and 0.157cm^3 volume. There is no tunneling or undermining noted. There is a large amount of serosanguineous drainage noted. The wound margin is distinct with the outline attached to the wound base. There is large (67-100%) red granulation within the wound bed. There is no necrotic tissue within the wound bed. The periwound skin appearance exhibited: Excoriation, Moist, Erythema. The periwound skin  appearance did not exhibit: Callus, Crepitus, Fluctuance, Friable, Induration, Localized Edema, Rash, Scarring, Dry/Scaly, Maceration, Atrophie Blanche, Cyanosis, Ecchymosis, Hemosiderin Staining, Mottled, Pallor, Rubor. The surrounding wound skin color is noted with erythema. Periwound temperature was noted as No Abnormality. The periwound has tenderness on palpation. Wound #9 status is Open. Original cause of wound was Trauma. The wound is located on the Left Upper Arm. The wound measures 3cm length x 1.5cm width x 0.1cm depth; 3.534cm^2 area and 0.353cm^3 volume. The wound is limited to skin breakdown. There is no tunneling or undermining noted. There is a small amount of serous drainage noted. The wound margin is flat and intact. There is medium (34-66%) pale granulation within the wound bed. There is a small (1-33%) amount of necrotic tissue within the wound bed including Adherent Slough. The periwound skin appearance exhibited: Excoriation, Moist. The periwound skin appearance did not exhibit: Callus, Crepitus, Fluctuance, Friable,  Induration, Localized Edema, Rash, Scarring, Dry/Scaly, Maceration, Atrophie Blanche, Cyanosis, Ecchymosis, Hemosiderin Staining, Mottled, Pallor, Rubor, Erythema. Assessment Active Problems ICD-10 E11.621 - Type 2 diabetes mellitus with foot ulcer Z79.01 - Long term (current) use of anticoagulants S41.112A - Laceration without foreign body of left upper arm, initial encounter S41.102A - Unspecified open wound of left upper arm, initial encounter LEEANNE, WHELAN ([id number]) Plan Wound Cleansing: Wound #7 Left,Anterior Forearm: Clean wound with Normal Saline. Wound #9 Left Upper Arm: Clean wound with Normal Saline. Anesthetic: Wound #7 Left,Anterior Forearm: Topical Lidocaine 4% cream applied to wound bed prior to debridement - in clinic Wound #9 Left Upper Arm: Topical Lidocaine 4% cream applied to wound bed prior to debridement - in clinic Primary  Wound Dressing: Wound #7 Left,Anterior Forearm: Mepitel One Hydrafera Blue Wound #9 Left Upper Arm: Mepitel One Hydrafera Blue Secondary Dressing: Wound #7 Left,Anterior Forearm: Conform/Kerlix - secured with Coban Wound #9 Left Upper Arm: Conform/Kerlix - secured with Coban Dressing Change Frequency: Wound #7 Left,Anterior Forearm: Other: - Do not touch dressing, leave in place. To be changed at next [place] appointment. Wound #9 Left Upper Arm: Other: - Do not touch dressing, leave in place. To be changed at next [place] appointment. Follow-up Appointments: Wound #7 Left,Anterior Forearm: Return Appointment in 1 week. Wound #9 Left Upper Arm: Return Appointment in 1 week. Additional Orders / Instructions: Wound #7 Left,Anterior Forearm: Other: - keep dressing dry. Wound #9 Left Upper Arm: Other: - keep dressing dry. we will continue with Mepitel and Hydrofera Blue and leave it in place for a week. nutrition and vitamin supplements have also been discussed with her CORNELIA, YEATS ([id number]) Electronic Signature(s) Signed: 04/10/2016 1:26:03 PM By: [name], FACS Entered By: [name] on 04/10/2016 13:26:03 [name] ([id number]) -------------------------------------------------------------------------------- SuperBill Details Patient Name: [name] Date of Service: 04/10/2016 Medical Record Number: [id number] Patient Account Number: [id number] Date of Birth/Sex: [birth year] (80 y.o. Female) Treating RN: Cornell Barman Primary Care Physician: Halina Maidens Other Clinician: Referring Physician: Halina Maidens Treating Physician/Extender: Frann Rider in Treatment: 18 Diagnosis Coding ICD-10 Codes Code Description E11.621 Type 2 diabetes mellitus with foot ulcer Z79.01 Long term (current) use of anticoagulants S41.112A Laceration without foreign body of left upper arm, initial encounter S41.102A Unspecified open wound of  left upper arm, initial encounter Physician Procedures CPT4 Code Description: [id number] - WC PHYS LEVEL 3 - EST PT ICD-10 Description Diagnosis E11.621 Type 2 diabetes mellitus with foot ulcer S41.112A Laceration without foreign body of left upper arm, S41.102A Unspecified open wound of left upper arm,  initial e Modifier: initial encoun ncounter Quantity: 1 ter Engineer, maintenance) Signed: 04/10/2016 1:26:20 PM By: [name], FACS Entered By: [name] on 04/10/2016 13:26:20

## 2016-04-11 DIAGNOSIS — I5032 Chronic diastolic (congestive) heart failure: Secondary | ICD-10-CM | POA: Diagnosis not present

## 2016-04-11 DIAGNOSIS — S50812D Abrasion of left forearm, subsequent encounter: Secondary | ICD-10-CM | POA: Diagnosis not present

## 2016-04-11 DIAGNOSIS — E1142 Type 2 diabetes mellitus with diabetic polyneuropathy: Secondary | ICD-10-CM | POA: Diagnosis not present

## 2016-04-11 DIAGNOSIS — S50811D Abrasion of right forearm, subsequent encounter: Secondary | ICD-10-CM | POA: Diagnosis not present

## 2016-04-11 DIAGNOSIS — J449 Chronic obstructive pulmonary disease, unspecified: Secondary | ICD-10-CM | POA: Diagnosis not present

## 2016-04-11 DIAGNOSIS — F0390 Unspecified dementia without behavioral disturbance: Secondary | ICD-10-CM | POA: Diagnosis not present

## 2016-04-14 ENCOUNTER — Encounter: Payer: Medicare Other | Attending: Surgery | Admitting: Surgery

## 2016-04-14 DIAGNOSIS — X58XXXA Exposure to other specified factors, initial encounter: Secondary | ICD-10-CM | POA: Diagnosis not present

## 2016-04-14 DIAGNOSIS — Z87891 Personal history of nicotine dependence: Secondary | ICD-10-CM | POA: Diagnosis not present

## 2016-04-14 DIAGNOSIS — R339 Retention of urine, unspecified: Secondary | ICD-10-CM | POA: Diagnosis not present

## 2016-04-14 DIAGNOSIS — E11621 Type 2 diabetes mellitus with foot ulcer: Secondary | ICD-10-CM | POA: Diagnosis not present

## 2016-04-14 DIAGNOSIS — I509 Heart failure, unspecified: Secondary | ICD-10-CM | POA: Diagnosis not present

## 2016-04-14 DIAGNOSIS — E1122 Type 2 diabetes mellitus with diabetic chronic kidney disease: Secondary | ICD-10-CM | POA: Diagnosis not present

## 2016-04-14 DIAGNOSIS — F419 Anxiety disorder, unspecified: Secondary | ICD-10-CM | POA: Diagnosis not present

## 2016-04-14 DIAGNOSIS — I480 Paroxysmal atrial fibrillation: Secondary | ICD-10-CM | POA: Diagnosis not present

## 2016-04-14 DIAGNOSIS — Z7901 Long term (current) use of anticoagulants: Secondary | ICD-10-CM | POA: Diagnosis not present

## 2016-04-14 DIAGNOSIS — N189 Chronic kidney disease, unspecified: Secondary | ICD-10-CM | POA: Insufficient documentation

## 2016-04-14 DIAGNOSIS — S51812A Laceration without foreign body of left forearm, initial encounter: Secondary | ICD-10-CM | POA: Diagnosis not present

## 2016-04-14 DIAGNOSIS — F0390 Unspecified dementia without behavioral disturbance: Secondary | ICD-10-CM | POA: Insufficient documentation

## 2016-04-14 DIAGNOSIS — S41112A Laceration without foreign body of left upper arm, initial encounter: Secondary | ICD-10-CM | POA: Insufficient documentation

## 2016-04-14 DIAGNOSIS — M329 Systemic lupus erythematosus, unspecified: Secondary | ICD-10-CM | POA: Insufficient documentation

## 2016-04-14 DIAGNOSIS — J449 Chronic obstructive pulmonary disease, unspecified: Secondary | ICD-10-CM | POA: Diagnosis not present

## 2016-04-15 NOTE — Progress Notes (Signed)
MAKIAH, MCALOON ([id number]) Visit Report for 04/14/2016 Arrival Information Details Patient Name: [name], [name]. Date of Service: 04/14/2016 2:15 PM Medical Record Number: [id number] Patient Account Number: [id number] Date of Birth/Sex: February 03, 1936 (79 y.o. Female) Treating RN: Cornell Barman Primary Care Physician: Halina Maidens Other Clinician: Referring Physician: Halina Maidens Treating Physician/Extender: Frann Rider in Treatment: 54 Visit Information History Since Last Visit Added or deleted any medications: No Patient Arrived: Wheel Chair Any new allergies or adverse reactions: No Arrival Time: 14:14 Had a fall or experienced change in No Accompanied By: husband and activities of daily living that may affect caregiver risk of falls: Transfer Assistance: None Signs or symptoms of abuse/neglect since last No Patient Identification Verified: Yes visito Secondary Verification Process Yes Hospitalized since last visit: No Completed: Has Dressing in Place as Prescribed: Yes Patient Requires Transmission- No Pain Present Now: No Based Precautions: Patient Has Alerts: Yes Patient Alerts: Patient on Blood Thinner Electronic Signature(s) Signed: 04/14/2016 3:24:19 PM By: Gretta Cool, RN, BSN, Kim RN, BSN Entered By: Gretta Cool, RN, BSN, Kim on 04/14/2016 14:18:21 [name] ([id number]) -------------------------------------------------------------------------------- Encounter Discharge Information Details Patient Name: [name]. Date of Service: 04/14/2016 2:15 PM Medical Record Number: [id number] Patient Account Number: [id number] Date of Birth/Sex: 1935/07/18 (79 y.o. Female) Treating RN: Cornell Barman Primary Care Physician: Halina Maidens Other Clinician: Referring Physician: Halina Maidens Treating Physician/Extender: Frann Rider in Treatment: 30 Encounter Discharge Information Items Discharge Pain Level: 0 Discharge Condition:  Stable Ambulatory Status: Wheelchair Discharge Destination: Home Transportation: Private Auto husband and Accompanied By: caregiver Schedule Follow-up Appointment: Yes Medication Reconciliation completed and provided to Patient/Care Yes [name]: Provided on Clinical Summary of Care: 04/14/2016 Form Type Recipient Paper Patient LK Electronic Signature(s) Signed: 04/14/2016 3:24:19 PM By: Gretta Cool RN, BSN, Kim RN, BSN Previous Signature: 04/14/2016 2:37:40 PM Version By: Ruthine Dose Entered By: Gretta Cool RN, BSN, Kim on 04/14/2016 14:54:27 [name] ([id number]) -------------------------------------------------------------------------------- Multi Wound Chart Details Patient Name: [name] Date of Service: 04/14/2016 2:15 PM Medical Record Number: [id number] Patient Account Number: [id number] Date of Birth/Sex: Dec 02, 1935 (79 y.o. Female) Treating RN: Cornell Barman Primary Care Physician: Halina Maidens Other Clinician: Referring Physician: Halina Maidens Treating Physician/Extender: Frann Rider in Treatment: 19 Vital Signs Height(in): 64 Pulse(bpm): 59 Weight(lbs): 110 Blood Pressure 148/42 (mmHg): Body Mass Index(BMI): 19 Temperature(F): 97.5 Respiratory Rate 16 (breaths/min): Photos: [N/A:N/A] Wound Location: Left Forearm - Anterior Left Upper Arm N/A Wounding Event: Trauma Trauma N/A Primary Etiology: Trauma, Other Trauma, Other N/A Comorbid History: Chronic Obstructive Chronic Obstructive N/A Pulmonary Disease Pulmonary Disease (COPD), Angina, (COPD), Angina, Congestive Heart Failure, Congestive Heart Failure, Type II Diabetes, Lupus Type II Diabetes, Lupus Erythematosus, Erythematosus, Osteoarthritis, Dementia Osteoarthritis, Dementia Date Acquired: 02/24/2016 04/02/2016 N/A Weeks of Treatment: 6 1 N/A Wound Status: Open Open N/A Measurements L x W x D 1x0.8x0.1 2.5x1x0.1 N/A (cm) Area (cm) : 0.628 1.963 N/A Volume (cm) : 0.063  0.196 N/A % Reduction in Area: 90.90% 68.80% N/A % Reduction in Volume: 90.80% 68.80% N/A Classification: Partial Thickness Partial Thickness N/A Exudate Amount: Large Small N/A Exudate Type: Serosanguineous Serous N/A Exudate Color: red, brown amber [name], [name] ([id number]) Wound Margin: Distinct, outline attached Flat and Intact N/A Granulation Amount: Large (67-100%) Medium (34-66%) N/A Granulation Quality: Red, Hyper-granulation Pale N/A Necrotic Amount: None Present (0%) Small (1-33%) N/A Epithelialization: Medium (34-66%) Small (1-33%) N/A Periwound Skin Texture: Excoriation: Yes Excoriation: Yes N/A Edema: No Edema: No Induration: No Induration: No Callus: No Callus: No Crepitus:  No Crepitus: No Fluctuance: No Fluctuance: No Friable: No Friable: No Rash: No Rash: No Scarring: No Scarring: No Periwound Skin Moist: Yes Moist: Yes N/A Moisture: Maceration: No Maceration: No Dry/Scaly: No Dry/Scaly: No Periwound Skin Color: Erythema: Yes Atrophie Blanche: No N/A Atrophie Blanche: No Cyanosis: No Cyanosis: No Ecchymosis: No Ecchymosis: No Erythema: No Hemosiderin Staining: No Hemosiderin Staining: No Mottled: No Mottled: No Pallor: No Pallor: No Rubor: No Rubor: No Temperature: No Abnormality N/A N/A Tenderness on Yes No N/A Palpation: Wound Preparation: Ulcer Cleansing: Ulcer Cleansing: N/A Rinsed/Irrigated with Rinsed/Irrigated with Saline Saline Topical Anesthetic Applied: Other: l.idocaine 4% Treatment Notes Electronic Signature(s) Signed: 04/14/2016 3:24:19 PM By: Gretta Cool, RN, BSN, Kim RN, BSN Entered By: Gretta Cool, RN, BSN, Kim on 04/14/2016 14:31:48 [name] ([id number]) -------------------------------------------------------------------------------- [place] Details Patient Name: [name], [name]. Date of Service: 04/14/2016 2:15 PM Medical Record Number: [id number] Patient Account Number: [id number] Date  of Birth/Sex: [birth year] (79 y.o. Female) Treating RN: Cornell Barman Primary Care Physician: Halina Maidens Other Clinician: Referring Physician: Halina Maidens Treating Physician/Extender: Frann Rider in Treatment: 47 Active Inactive Abuse / Safety / Falls / Self Care Management Nursing Diagnoses: Potential for falls Goals: Patient/caregiver will verbalize understanding of skin care regimen Date Initiated: 12/02/2015 Goal Status: Active Patient/caregiver will verbalize/demonstrate measures taken to prevent injury and/or falls Date Initiated: 12/02/2015 Goal Status: Active Interventions: Assess fall risk on admission and as needed Assess: immobility, friction, shearing, incontinence upon admission and as needed Assess impairment of mobility on admission and as needed per policy Assess self care needs on admission and as needed Provide education on fall prevention Provide education on safe transfers Notes: Wound/Skin Impairment Nursing Diagnoses: Impaired tissue integrity Goals: Patient/caregiver will verbalize understanding of skin care regimen Date Initiated: 12/02/2015 Goal Status: Active Interventions: Assess patient/caregiver ability to obtain necessary supplies Assess patient/caregiver ability to perform ulcer/skin care regimen upon admission and as needed [name], [name] ([id number]) Assess ulceration(s) every visit Provide education on ulcer and skin care Notes: Electronic Signature(s) Signed: 04/14/2016 3:24:19 PM By: Gretta Cool, RN, BSN, Kim RN, BSN Entered By: Gretta Cool, RN, BSN, Kim on 04/14/2016 14:31:39 [name] ([id number]) -------------------------------------------------------------------------------- Pain Assessment Details Patient Name: [name]. Date of Service: 04/14/2016 2:15 PM Medical Record Number: [id number] Patient Account Number: [id number] Date of Birth/Sex: 16-Jun-1935 (79 y.o. Female) Treating RN: Cornell Barman Primary Care  Physician: Halina Maidens Other Clinician: Referring Physician: Halina Maidens Treating Physician/Extender: Frann Rider in Treatment: 77 Active Problems Location of Pain Severity and Description of Pain Patient Has Paino No Site Locations With Dressing Change: No Pain Management and Medication Current Pain Management: Electronic Signature(s) Signed: 04/14/2016 3:24:19 PM By: Gretta Cool, RN, BSN, Kim RN, BSN Entered By: Gretta Cool, RN, BSN, Kim on 04/14/2016 14:18:31 [name] ([id number]) -------------------------------------------------------------------------------- Wound Assessment Details Patient Name: [name]. Date of Service: 04/14/2016 2:15 PM Medical Record Number: [id number] Patient Account Number: [id number] Date of Birth/Sex: 1935/07/23 (79 y.o. Female) Treating RN: Cornell Barman Primary Care Physician: Halina Maidens Other Clinician: Referring Physician: Halina Maidens Treating Physician/Extender: Frann Rider in Treatment: 19 Wound Status Wound Number: 7 Primary Trauma, Other Etiology: Wound Location: Left Forearm - Anterior Wound Open Wounding Event: Trauma Status: Date Acquired: 02/24/2016 Comorbid Chronic Obstructive Pulmonary Disease Weeks Of Treatment: 6 History: (COPD), Angina, Congestive Heart Clustered Wound: No Failure, Type II Diabetes, Lupus Erythematosus, Osteoarthritis, Dementia Photos Wound Measurements Length: (cm) 1 Width: (cm) 0.8 Depth: (cm) 0.1 Area: (cm) 0.628 Volume: (cm) 0.063 % Reduction in  Area: 90.9% % Reduction in Volume: 90.8% Epithelialization: Medium (34-66%) Wound Description Classification: Partial Thickness Wound Margin: Distinct, outline attached Exudate Amount: Large Exudate Type: Serosanguineous Exudate Color: red, brown Foul Odor After Cleansing: No Wound Bed Granulation Amount: Large (67-100%) Granulation Quality: Red, Hyper-granulation Necrotic Amount: None Present (0%) [name], [name] ([id number]) Periwound Skin Texture Texture Color No Abnormalities Noted: No No Abnormalities Noted: No Callus: No Atrophie Blanche: No Crepitus: No Cyanosis: No Excoriation: Yes Ecchymosis: No Fluctuance: No Erythema: Yes Friable: No Hemosiderin Staining: No Induration: No Mottled: No Localized Edema: No Pallor: No Rash: No Rubor: No Scarring: No Temperature / Pain Moisture Temperature: No Abnormality No Abnormalities Noted: No Tenderness on Palpation: Yes Dry / Scaly: No Maceration: No Moist: Yes Wound Preparation Ulcer Cleansing: Rinsed/Irrigated with Saline Treatment Notes Wound #7 (Left, Anterior Forearm) 1. Cleansed with: Clean wound with Normal Saline 2. Anesthetic Topical Lidocaine 4% cream to wound bed prior to debridement 5. Secondary Dressing Applied Gauze and Kerlix/Conform Notes [employer]tec sorbact, Secured with Event organiser) Signed: 04/14/2016 3:24:19 PM By: Gretta Cool, RN, BSN, Kim RN, BSN Entered By: Gretta Cool, RN, BSN, Kim on 04/14/2016 14:26:26 [name] ([id number]) -------------------------------------------------------------------------------- Wound Assessment Details Patient Name: [name] Date of Service: 04/14/2016 2:15 PM Medical Record Number: [id number] Patient Account Number: [id number] Date of Birth/Sex: 09/17/1935 (79 y.o. Female) Treating RN: Cornell Barman Primary Care Physician: Halina Maidens Other Clinician: Referring Physician: Halina Maidens Treating Physician/Extender: Frann Rider in Treatment: 19 Wound Status Wound Number: 9 Primary Trauma, Other Etiology: Wound Location: Left Upper Arm Wound Open Wounding Event: Trauma Status: Date Acquired: 04/02/2016 Comorbid Chronic Obstructive Pulmonary Disease Weeks Of Treatment: 1 History: (COPD), Angina, Congestive Heart Clustered Wound: No Failure, Type II Diabetes, Lupus Erythematosus, Osteoarthritis, Dementia Photos Wound  Measurements Length: (cm) 2.5 % Reduction in Width: (cm) 1 % Reduction in Depth: (cm) 0.1 Epithelializati Area: (cm) 1.963 Volume: (cm) 0.196 Area: 68.8% Volume: 68.8% on: Small (1-33%) Wound Description Classification: Partial Thickness Wound Margin: Flat and Intact Exudate Amount: Small Exudate Type: Serous Exudate Color: amber Foul Odor After Cleansing: No Wound Bed Granulation Amount: Medium (34-66%) Exposed Structure Granulation Quality: Pale Fascia Exposed: No Necrotic Amount: Small (1-33%) Fat Layer Exposed: No Necrotic Quality: Adherent Slough Tendon Exposed: No [name], [name] ([id number]) Muscle Exposed: No Joint Exposed: No Bone Exposed: No Limited to Skin Breakdown Periwound Skin Texture Texture Color No Abnormalities Noted: No No Abnormalities Noted: No Callus: No Atrophie Blanche: No Crepitus: No Cyanosis: No Excoriation: Yes Ecchymosis: No Fluctuance: No Erythema: No Friable: No Hemosiderin Staining: No Induration: No Mottled: No Localized Edema: No Pallor: No Rash: No Rubor: No Scarring: No Moisture No Abnormalities Noted: No Dry / Scaly: No Maceration: No Moist: Yes Wound Preparation Ulcer Cleansing: Rinsed/Irrigated with Saline Topical Anesthetic Applied: Other: l.idocaine 4%, Treatment Notes Wound #9 (Left Upper Arm) 1. Cleansed with: Clean wound with Normal Saline 2. Anesthetic Topical Lidocaine 4% cream to wound bed prior to debridement 5. Secondary Dressing Applied Gauze and Kerlix/Conform Notes [employer] sorbact, Secured with Event organiser) Signed: 04/14/2016 3:24:19 PM By: Gretta Cool, RN, BSN, Kim RN, BSN Entered By: Gretta Cool, RN, BSN, Kim on 04/14/2016 14:26:51 [name] ([id number]) -------------------------------------------------------------------------------- [place] Details Patient Name: [name] Date of Service: 04/14/2016 2:15 PM Medical Record Number: [id number] Patient Account Number:  [id number] Date of Birth/Sex: 15-Jun-1935 (79 y.o. Female) Treating RN: Cornell Barman Primary Care Physician: Halina Maidens Other Clinician: Referring Physician: Halina Maidens Treating Physician/Extender: Frann Rider in Treatment: [phone]  Vital Signs Time Taken: 14:18 Temperature (F): 97.5 Height (in): 64 Pulse (bpm): 59 Weight (lbs): 110 Respiratory Rate (breaths/min): 16 Body Mass Index (BMI): 18.9 Blood Pressure (mmHg): 148/42 Reference Range: 80 - 120 mg / dl Electronic Signature(s) Signed: 04/14/2016 3:24:19 PM By: Gretta Cool, RN, BSN, Kim RN, BSN Entered By: Gretta Cool, RN, BSN, Kim on 04/14/2016 14:18:51

## 2016-04-15 NOTE — Progress Notes (Addendum)
ERLINE, KINCHELOE ([id number]) Visit Report for 04/14/2016 Chief Complaint Document Details Patient Name: [name], [name] 04/14/2016 2:15 Date of Service: PM Medical Record [id number] Number: Patient Account Number: [id number] [birth year] (80 y.o. Treating RN: Cornell Barman Date of Birth/Sex: Female) Other Clinician: Primary Care Physician: Halina Maidens Treating [name] Referring Physician: Halina Maidens Physician/Extender: Suella Grove in Treatment: 19 Information Obtained from: Patient Chief Complaint Patient is at the clinic for treatment of an open pressure ulcer to her right calcaneum which has been there for about 4 weeks Electronic Signature(s) Signed: 04/14/2016 2:36:42 PM By: [name], FACS Entered By: [name] on 04/14/2016 14:36:42 [name] ([id number]) -------------------------------------------------------------------------------- HPI Details Patient Name: [name], [name] 04/14/2016 2:15 Date of Service: PM Medical Record [id number] Number: Patient Account Number: [id number] Oct 21, 1935 (80 y.o. Treating RN: Cornell Barman Date of Birth/Sex: Female) Other Clinician: Primary Care Physician: Halina Maidens Treating [name] Referring Physician: Halina Maidens Physician/Extender: Suella Grove in Treatment: 19 History of Present Illness Location: right posterior heel, and both lower extremities Quality: Patient reports experiencing a sharp pain to affected area(s). Severity: Patient states wound (s) are getting better. Duration: Patient has had the wound for < 4 weeks prior to presenting for treatment Timing: Pain in wound is Intermittent (comes and goes Context: The wound appeared gradually over time while she was in hospital with a right hip fracture Modifying Factors: Consults to this date include:was seen by her PCP and put on some antibiotics a while ago Associated Signs and Symptoms: Patient reports having difficulty standing for long  periods. HPI Description: 80 year old patient referred who was recently discharged in April of this year with a left heel ulcer now comes with a right heel ulcer and some ulcerations in both lower extremities which have all come along during her recent hospitalization which she had a right hip fracture. She has a past medical history of COPD, diabetes mellitus type 2 with chronic kidney disease, multi-infarct dementia, anxiety, urinary retention, paroxysmal atrial fibrillation. She has also had moderate malnutrition, multi-infarct dementia, congestive heart failure, left displaced femoral neck fracture, systemic lupus erythematosus. She is also status total abdominal hysterectomy with bilateral salpingo-oophorectomy, EGDs, hip arthroplasty on the left, cataract surgery. She has been a from a smoker and quit in May 2013 and she smoked for about 40 years. recently admitted to [facility] between January 19 and 07/03/2015 12/09/2015 -- o right foot x-ray -- IMPRESSION:Soft tissue ulceration of the heel. No underlying erosive bony abnormality. Diffuse degenerative change. No acute bony abnormality. 12/30/2015-- admitted to the hospital between July 12 and July 18 of 2017 and was treated for cerebral infarction, infected decubitus ulcer and aspiration pneumonia of the right lung. MRI was negative for stroke and was thought to be secondary to pneumonia. She was treated with clindamycin. Patient was discharged home on Keflex and Zyvox. Dr. Lucky Cowboy took her for an aortogram and a right lower extremity runoff and found to 30% stenosis in the mid to distal SFA but no significant stenosis in the popliteal artery and the SFA. There was a two-vessel runoff through the anterior tibial artery and peroneal artery without significant stenosis. felt her perfusion was adequate for wound healing. 02/28/2016 -- days ago she had a lacerated wound on her left forearm which they have been treating  locally and have asked me to take a look today. 03/13/16: returns today for f/u. the right posterior heel wound has achieved epithelialization. the left forearm wound is healing without issue. no systemic  s/s of infection. ALIZIA, JANKOVIC ([id number]) 03/20/16: returns today for f/u of a left forearm wound. husband reports dressing slides down. no reports of systemic s/s of infection. no new wounds or skin breakdown. 04/03/2016 -- she has had another laceration with a skin flap on the left upper arm and she also has one on the anterior part of the left forearm. Electronic Signature(s) Signed: 04/14/2016 2:37:20 PM By: [name], FACS Entered By: [name] on 04/14/2016 14:37:20 [name] ([id number]) -------------------------------------------------------------------------------- Physical Exam Details Patient Name: [name], [name] 04/14/2016 2:15 Date of Service: PM Medical Record [id number] Number: Patient Account Number: [id number] 1935/07/13 (80 y.o. Treating RN: Cornell Barman Date of Birth/Sex: Female) Other Clinician: Primary Care Physician: Halina Maidens Treating [name] Referring Physician: Halina Maidens Physician/Extender: Suella Grove in Treatment: 19 Constitutional . Pulse regular. Respirations normal and unlabored. Afebrile. . Eyes Nonicteric. Reactive to light. Ears, Nose, Mouth, and Throat Lips, teeth, and gums WNL.Marland Kitchen Moist mucosa without lesions. Neck supple and nontender. No palpable supraclavicular or cervical adenopathy. Normal sized without goiter. Respiratory WNL. No retractions.. Breath sounds WNL, No rubs, rales, rhonchi, or wheeze.. Cardiovascular Heart rhythm and rate regular, no murmur or gallop.. Pedal Pulses WNL. No clubbing, cyanosis or edema. Gastrointestinal (GI) Abdomen without masses or tenderness.. No liver or spleen enlargement or tenderness.. Genitourinary (GU) No hydrocele, spermatocele, tenderness of the cord, or  testicular mass.Marland Kitchen Penis without lesions.Lowella Fairy without lesions. No cystocele, or rectocele. Pelvic support intact, no discharge.Marland Kitchen Urethra without masses, tenderness or scarring.Marland Kitchen Lymphatic No adneopathy. No adenopathy. No adenopathy. Musculoskeletal Adexa without tenderness or enlargement.. Digits and nails w/o clubbing, cyanosis, infection, petechiae, ischemia, or inflammatory conditions.. Integumentary (Hair, Skin) No suspicious lesions. No crepitus or fluctuance. No peri-wound warmth or erythema. No masses.Marland Kitchen Psychiatric Judgement and insight Intact.. No evidence of depression, anxiety, or agitation.. Notes the wounds on the left upper arm is looking excellent with a minute area still open and epithelialization is been excellent Electronic Signature(s) Signed: 04/14/2016 2:37:45 PM By: [name], FACS [name] ([id number]) Entered By: [name] on 04/14/2016 14:37:45 [name] ([id number]) -------------------------------------------------------------------------------- Physician Orders Details Patient Name: TANITH, RUTHER 04/14/2016 2:15 Date of Service: PM Medical Record [id number] Number: Patient Account Number: [id number] 03/06/1936 (80 y.o. Treating RN: Cornell Barman Date of Birth/Sex: Female) Other Clinician: Primary Care Physician: Halina Maidens Treating [name] Referring Physician: Halina Maidens Physician/Extender: Suella Grove in Treatment: 15 Verbal / Phone Orders: Yes Clinician: Cornell Barman Read Back and Verified: Yes Diagnosis Coding Wound Cleansing Wound #7 Left,Anterior Forearm o Clean wound with Normal Saline. Wound #9 Left Upper Arm o Clean wound with Normal Saline. Primary Wound Dressing Wound #7 Left,Anterior Forearm o Cutimed Siltec Sorbact Wound #9 Left Upper Arm o Cutimed Siltec Sorbact Dressing Change Frequency Wound #7 Left,Anterior Forearm o Change dressing every week Wound #9 Left Upper Arm o  Change dressing every week Follow-up Appointments Wound #7 Left,Anterior Forearm o Return Appointment in 1 week. Wound #9 Left Upper Arm o Return Appointment in 1 week. Edema Control Wound #7 Left,Anterior Forearm o Other: - [place] Wound #9 Left Upper Arm o Other: - NAKYRA, DORRANCE ([id number]) Home Health Wound #7 Left,Anterior Forearm o D/C [place] -0 thank you for providing wound care services to our patient! Wound #9 Left Upper Arm o D/C Home Health Services - [facility] -0 thank you for providing wound care services to our patient! Electronic Signature(s) Signed: 04/20/2016 5:08:19 PM By: [name],  FACS Signed: 04/20/2016 5:37:23 PM By: [name] Previous Signature: 04/14/2016 3:24:19 PM Version By: Gretta Cool RN, BSN, Kim RN, BSN Previous Signature: 04/14/2016 4:03:12 PM Version By: [name], FACS Entered By: [name] on 04/20/2016 10:42:08 [name] ([id number]) -------------------------------------------------------------------------------- Problem List Details Patient Name: [name], [name] 04/14/2016 2:15 Date of Service: PM Medical Record [id number] Number: Patient Account Number: [id number] [birth year] (80 y.o. Treating RN: Cornell Barman Date of Birth/Sex: Female) Other Clinician: Primary Care Physician: Halina Maidens Treating [name] Referring Physician: Halina Maidens Physician/Extender: Suella Grove in Treatment: 19 Active Problems ICD-10 Encounter Code Description Active Date Diagnosis E11.621 Type 2 diabetes mellitus with foot ulcer 12/02/2015 Yes Z79.01 Long term (current) use of anticoagulants 12/02/2015 Yes S41.112A Laceration without foreign body of left upper arm, initial 02/28/2016 Yes encounter S41.102A Unspecified open wound of left upper arm, initial 02/28/2016 Yes encounter Inactive Problems Resolved Problems ICD-10 Code Description Active Date Resolved Date L89.613 Pressure  ulcer of right heel, stage 3 12/02/2015 12/02/2015 L97.212 Non-pressure chronic ulcer of right calf with fat layer 12/02/2015 12/02/2015 exposed L97.211 Non-pressure chronic ulcer of right calf limited to 12/02/2015 12/02/2015 breakdown of skin ROZELIA, SCHLOSSER ([id number]) Electronic Signature(s) Signed: 04/14/2016 2:36:36 PM By: [name], FACS Entered By: [name] on 04/14/2016 14:36:35 [name] ([id number]) -------------------------------------------------------------------------------- Progress Note Details Patient Name: [name] 04/14/2016 2:15 Date of Service: PM Medical Record [id number] Number: Patient Account Number: [id number] August 28, 1935 (80 y.o. Treating RN: Cornell Barman Date of Birth/Sex: Female) Other Clinician: Primary Care Physician: Halina Maidens Treating [name] Referring Physician: Halina Maidens Physician/Extender: Suella Grove in Treatment: 19 Subjective Chief Complaint Information obtained from Patient Patient is at the clinic for treatment of an open pressure ulcer to her right calcaneum which has been there for about 4 weeks History of Present Illness (HPI) The following HPI elements were documented for the patient's wound: Location: right posterior heel, and both lower extremities Quality: Patient reports experiencing a sharp pain to affected area(s). Severity: Patient states wound (s) are getting better. Duration: Patient has had the wound for < 4 weeks prior to presenting for treatment Timing: Pain in wound is Intermittent (comes and goes Context: The wound appeared gradually over time while she was in hospital with a right hip fracture Modifying Factors: Consults to this date include:was seen by her PCP and put on some antibiotics a while ago Associated Signs and Symptoms: Patient reports having difficulty standing for long periods. 80 year old patient referred who was recently discharged in April of this year with a left heel  ulcer now comes with a right heel ulcer and some ulcerations in both lower extremities which have all come along during her recent hospitalization which she had a right hip fracture. She has a past medical history of COPD, diabetes mellitus type 2 with chronic kidney disease, multi-infarct dementia, anxiety, urinary retention, paroxysmal atrial fibrillation. She has also had moderate malnutrition, multi-infarct dementia, congestive heart failure, left displaced femoral neck fracture, systemic lupus erythematosus. She is also status total abdominal hysterectomy with bilateral salpingo-oophorectomy, EGDs, hip arthroplasty on the left, cataract surgery. She has been a from a smoker and quit in May 2013 and she smoked for about 40 years. recently admitted to [facility] between January 19 and 07/03/2015 12/09/2015 -- right foot x-ray -- IMPRESSION:Soft tissue ulceration of the heel. No underlying erosive bony abnormality. Diffuse degenerative change. No acute bony abnormality. 12/30/2015-- admitted to the hospital between July 12 and July 18 of 2017 and was  treated for cerebral infarction, infected decubitus ulcer and aspiration pneumonia of the right lung. MRI was negative for stroke and was thought to be secondary to pneumonia. She was treated with clindamycin. Patient was discharged home on Keflex and Zyvox. [name] ([id number]) Dr. Lucky Cowboy took her for an aortogram and a right lower extremity runoff and found to 30% stenosis in the mid to distal SFA but no significant stenosis in the popliteal artery and the SFA. There was a two-vessel runoff through the anterior tibial artery and peroneal artery without significant stenosis. felt her perfusion was adequate for wound healing. 02/28/2016 -- days ago she had a lacerated wound on her left forearm which they have been treating locally and have asked me to take a look today. 03/13/16: returns today for f/u. the right  posterior heel wound has achieved epithelialization. the left forearm wound is healing without issue. no systemic s/s of infection. 03/20/16: returns today for f/u of a left forearm wound. husband reports dressing slides down. no reports of systemic s/s of infection. no new wounds or skin breakdown. 04/03/2016 -- she has had another laceration with a skin flap on the left upper arm and she also has one on the anterior part of the left forearm. Objective Constitutional Pulse regular. Respirations normal and unlabored. Afebrile. Vitals Time Taken: 2:18 PM, Height: 64 in, Weight: 110 lbs, BMI: 18.9, Temperature: 97.5 F, Pulse: 59 bpm, Respiratory Rate: 16 breaths/min, Blood Pressure: 148/42 mmHg. Eyes Nonicteric. Reactive to light. Ears, Nose, Mouth, and Throat Lips, teeth, and gums WNL.Marland Kitchen Moist mucosa without lesions. Neck supple and nontender. No palpable supraclavicular or cervical adenopathy. Normal sized without goiter. Respiratory WNL. No retractions.. Breath sounds WNL, No rubs, rales, rhonchi, or wheeze.. Cardiovascular Heart rhythm and rate regular, no murmur or gallop.. Pedal Pulses WNL. No clubbing, cyanosis or edema. Gastrointestinal (GI) Abdomen without masses or tenderness.. No liver or spleen enlargement or tenderness.. Genitourinary (GU) No hydrocele, spermatocele, tenderness of the cord, or testicular mass.Marland Kitchen Penis without lesions.Lowella Fairy without lesions. No cystocele, or rectocele. Pelvic support intact, no discharge.Marland Kitchen Urethra without masses, MARQUITTA, KESSLER ([id number]) tenderness or scarring.Marland Kitchen Lymphatic No adneopathy. No adenopathy. No adenopathy. Musculoskeletal Adexa without tenderness or enlargement.. Digits and nails w/o clubbing, cyanosis, infection, petechiae, ischemia, or inflammatory conditions.Marland Kitchen Psychiatric Judgement and insight Intact.. No evidence of depression, anxiety, or agitation.. General Notes: the wounds on the left upper arm is looking  excellent with a minute area still open and epithelialization is been excellent Integumentary (Hair, Skin) No suspicious lesions. No crepitus or fluctuance. No peri-wound warmth or erythema. No masses.. Wound #7 status is Open. Original cause of wound was Trauma. The wound is located on the Left,Anterior Forearm. The wound measures 1cm length x 0.8cm width x 0.1cm depth; 0.628cm^2 area and 0.063cm^3 volume. There is a large amount of serosanguineous drainage noted. The wound margin is distinct with the outline attached to the wound base. There is large (67-100%) red granulation within the wound bed. There is no necrotic tissue within the wound bed. The periwound skin appearance exhibited: Excoriation, Moist, Erythema. The periwound skin appearance did not exhibit: Callus, Crepitus, Fluctuance, Friable, Induration, Localized Edema, Rash, Scarring, Dry/Scaly, Maceration, Atrophie Blanche, Cyanosis, Ecchymosis, Hemosiderin Staining, Mottled, Pallor, Rubor. The surrounding wound skin color is noted with erythema. Periwound temperature was noted as No Abnormality. The periwound has tenderness on palpation. Wound #9 status is Open. Original cause of wound was Trauma. The wound is located on the Left Upper Arm. The wound measures  2.5cm length x 1cm width x 0.1cm depth; 1.963cm^2 area and 0.196cm^3 volume. The wound is limited to skin breakdown. There is a small amount of serous drainage noted. The wound margin is flat and intact. There is medium (34-66%) pale granulation within the wound bed. There is a small (1-33%) amount of necrotic tissue within the wound bed including Adherent Slough. The periwound skin appearance exhibited: Excoriation, Moist. The periwound skin appearance did not exhibit: Callus, Crepitus, Fluctuance, Friable, Induration, Localized Edema, Rash, Scarring, Dry/Scaly, Maceration, Atrophie Blanche, Cyanosis, Ecchymosis, Hemosiderin Staining, Mottled, Pallor, Rubor,  Erythema. Assessment Active Problems ICD-10 E11.621 - Type 2 diabetes mellitus with foot ulcer Z79.01 - Long term (current) use of anticoagulants S41.112A - Laceration without foreign body of left upper arm, initial encounter S41.102A - Unspecified open wound of left upper arm, initial encounter [name], [name] ([id number]) Plan Wound Cleansing: Wound #7 Left,Anterior Forearm: Clean wound with Normal Saline. Wound #9 Left Upper Arm: Clean wound with Normal Saline. Primary Wound Dressing: Wound #7 Left,Anterior Forearm: Cutimed Siltec Sorbact Wound #9 Left Upper Arm: Cutimed Siltec Sorbact Dressing Change Frequency: Wound #7 Left,Anterior Forearm: Change dressing every week Wound #9 Left Upper Arm: Change dressing every week Follow-up Appointments: Wound #7 Left,Anterior Forearm: Return Appointment in 1 week. Wound #9 Left Upper Arm: Return Appointment in 1 week. Edema Control: Wound #7 Left,Anterior Forearm: Other: - [place] Wound #9 Left Upper Arm: Other: - [place]: Wound #7 Left,Anterior Forearm: D/C [place]0 thank you for providing wound care services to our patient! Wound #9 Left Upper Arm: D/C Home Health Services - [facility] -0 thank you for providing wound care services to our patient! at this stage she has had good healing and I will use a piece of Siltec Sorbact foam and cover it lightly with a Coban and leave it on for the entire week Electronic Signature(s) LINDAANN, YING ([id number]) Signed: 04/21/2016 8:45:18 AM By: [name], FACS Previous Signature: 04/14/2016 2:39:20 PM Version By: [name], FACS Entered By: [name] on 04/21/2016 08:45:18 [name] ([id number]) -------------------------------------------------------------------------------- SuperBill Details Patient Name: [name]. Date of Service: 04/14/2016 Medical Record Number: [id number] Patient Account Number:  [id number] Date of Birth/Sex: 04-Dec-1935 (80 y.o. Female) Treating RN: Cornell Barman Primary Care Physician: Halina Maidens Other Clinician: Referring Physician: Halina Maidens Treating Physician/Extender: Frann Rider in Treatment: 19 Diagnosis Coding ICD-10 Codes Code Description E11.621 Type 2 diabetes mellitus with foot ulcer Z79.01 Long term (current) use of anticoagulants S41.112A Laceration without foreign body of left upper arm, initial encounter S41.102A Unspecified open wound of left upper arm, initial encounter Facility Procedures CPT4 Code: [id number] Description: 99213 - WOUND CARE VISIT-LEV 3 EST PT Modifier: Quantity: 1 Physician Procedures CPT4 Code Description: [id number] - WC PHYS LEVEL 3 - EST PT ICD-10 Description Diagnosis E11.621 Type 2 diabetes mellitus with foot ulcer Z79.01 Long term (current) use of anticoagulants S41.112A Laceration without foreign body of left upper arm,  S41.102A Unspecified open wound of left upper arm, initial e Modifier: initial encount ncounter Quantity: 1 er Engineer, maintenance) Signed: 04/17/2016 1:18:17 PM By: Gretta Cool, RN, BSN, Kim RN, BSN Signed: 04/17/2016 4:01:52 PM By: [name], FACS Previous Signature: 04/14/2016 2:39:33 PM Version By: [name], FACS Entered By: Gretta Cool, RN, BSN, Kim on 04/17/2016 10:27:08

## 2016-04-18 DIAGNOSIS — F0390 Unspecified dementia without behavioral disturbance: Secondary | ICD-10-CM | POA: Diagnosis not present

## 2016-04-18 DIAGNOSIS — E1142 Type 2 diabetes mellitus with diabetic polyneuropathy: Secondary | ICD-10-CM | POA: Diagnosis not present

## 2016-04-18 DIAGNOSIS — I5032 Chronic diastolic (congestive) heart failure: Secondary | ICD-10-CM | POA: Diagnosis not present

## 2016-04-18 DIAGNOSIS — S50812D Abrasion of left forearm, subsequent encounter: Secondary | ICD-10-CM | POA: Diagnosis not present

## 2016-04-18 DIAGNOSIS — S50811D Abrasion of right forearm, subsequent encounter: Secondary | ICD-10-CM | POA: Diagnosis not present

## 2016-04-18 DIAGNOSIS — J449 Chronic obstructive pulmonary disease, unspecified: Secondary | ICD-10-CM | POA: Diagnosis not present

## 2016-04-21 ENCOUNTER — Encounter: Payer: Medicare Other | Admitting: Surgery

## 2016-04-21 DIAGNOSIS — E1122 Type 2 diabetes mellitus with diabetic chronic kidney disease: Secondary | ICD-10-CM | POA: Diagnosis not present

## 2016-04-21 DIAGNOSIS — F0390 Unspecified dementia without behavioral disturbance: Secondary | ICD-10-CM | POA: Diagnosis not present

## 2016-04-21 DIAGNOSIS — Z7901 Long term (current) use of anticoagulants: Secondary | ICD-10-CM | POA: Diagnosis not present

## 2016-04-21 DIAGNOSIS — E11621 Type 2 diabetes mellitus with foot ulcer: Secondary | ICD-10-CM | POA: Diagnosis not present

## 2016-04-21 DIAGNOSIS — J449 Chronic obstructive pulmonary disease, unspecified: Secondary | ICD-10-CM | POA: Diagnosis not present

## 2016-04-21 DIAGNOSIS — S41112A Laceration without foreign body of left upper arm, initial encounter: Secondary | ICD-10-CM | POA: Diagnosis not present

## 2016-04-22 NOTE — Progress Notes (Addendum)
IDELL, DUQUAINE ([id number]) Visit Report for 04/21/2016 Chief Complaint Document Details Patient Name: [name], [name] 04/21/2016 3:00 Date of Service: PM Medical Record [id number] Number: Patient Account Number: [id number] 1936-03-07 (80 y.o. Treating RN: Cornell Barman Date of Birth/Sex: Female) Other Clinician: Primary Care Physician: Halina Maidens Treating [name] Referring Physician: Halina Maidens Physician/Extender: Suella Grove in Treatment: 20 Information Obtained from: Patient Chief Complaint Patient is at the clinic for treatment of an open pressure ulcer to her right calcaneum which has been there for about 4 weeks Electronic Signature(s) Signed: 04/21/2016 4:07:19 PM By: [name], FACS Entered By: [name] on 04/21/2016 16:07:19 [name] ([id number]) -------------------------------------------------------------------------------- HPI Details Patient Name: [name], [name] 04/21/2016 3:00 Date of Service: PM Medical Record [id number] Number: Patient Account Number: [id number] 1935/09/05 (80 y.o. Treating RN: Cornell Barman Date of Birth/Sex: Female) Other Clinician: Primary Care Physician: Halina Maidens Treating [name] Referring Physician: Halina Maidens Physician/Extender: Suella Grove in Treatment: 20 History of Present Illness Location: right posterior heel, and both lower extremities Quality: Patient reports experiencing a sharp pain to affected area(s). Severity: Patient states wound (s) are getting better. Duration: Patient has had the wound for < 4 weeks prior to presenting for treatment Timing: Pain in wound is Intermittent (comes and goes Context: The wound appeared gradually over time while she was in hospital with a right hip fracture Modifying Factors: Consults to this date include:was seen by her PCP and put on some antibiotics a while ago Associated Signs and Symptoms: Patient reports having difficulty standing for long  periods. HPI Description: 80 year old patient referred who was recently discharged in April of this year with a left heel ulcer now comes with a right heel ulcer and some ulcerations in both lower extremities which have all come along during her recent hospitalization which she had a right hip fracture. She has a past medical history of COPD, diabetes mellitus type 2 with chronic kidney disease, multi-infarct dementia, anxiety, urinary retention, paroxysmal atrial fibrillation. She has also had moderate malnutrition, multi-infarct dementia, congestive heart failure, left displaced femoral neck fracture, systemic lupus erythematosus. She is also status total abdominal hysterectomy with bilateral salpingo-oophorectomy, EGDs, hip arthroplasty on the left, cataract surgery. She has been a from a smoker and quit in May 2013 and she smoked for about 40 years. recently admitted to [facility] between January 19 and 07/03/2015 12/09/2015 -- o right foot x-ray -- IMPRESSION:Soft tissue ulceration of the heel. No underlying erosive bony abnormality. Diffuse degenerative change. No acute bony abnormality. 12/30/2015-- admitted to the hospital between July 12 and July 18 of 2017 and was treated for cerebral infarction, infected decubitus ulcer and aspiration pneumonia of the right lung. MRI was negative for stroke and was thought to be secondary to pneumonia. She was treated with clindamycin. Patient was discharged home on Keflex and Zyvox. Dr. Lucky Cowboy took her for an aortogram and a right lower extremity runoff and found to 30% stenosis in the mid to distal SFA but no significant stenosis in the popliteal artery and the SFA. There was a two-vessel runoff through the anterior tibial artery and peroneal artery without significant stenosis. felt her perfusion was adequate for wound healing. 02/28/2016 -- days ago she had a lacerated wound on her left forearm which they have been treating  locally and have asked me to take a look today. 03/13/16: returns today for f/u. the right posterior heel wound has achieved epithelialization. the left forearm wound is healing without issue. no systemic  s/s of infection. [name], [name] ([id number]) 03/20/16: returns today for f/u of a left forearm wound. husband reports dressing slides down. no reports of systemic s/s of infection. no new wounds or skin breakdown. 04/03/2016 -- she has had another laceration with a skin flap on the left upper arm and she also has one on the anterior part of the left forearm. Electronic Signature(s) Signed: 04/21/2016 4:07:23 PM By: [name], FACS Entered By: [name] on 04/21/2016 16:07:23 [name] ([id number]) -------------------------------------------------------------------------------- Physical Exam Details Patient Name: [name], [name] 04/21/2016 3:00 Date of Service: PM Medical Record [id number] Number: Patient Account Number: [id number] 08-20-1935 (80 y.o. Treating RN: Cornell Barman Date of Birth/Sex: Female) Other Clinician: Primary Care Physician: Halina Maidens Treating [name] Referring Physician: Halina Maidens Physician/Extender: Suella Grove in Treatment: 20 Constitutional . Pulse regular. Respirations normal and unlabored. Afebrile. . Eyes Nonicteric. Reactive to light. Ears, Nose, Mouth, and Throat Lips, teeth, and gums WNL.Marland Kitchen Moist mucosa without lesions. Neck supple and nontender. No palpable supraclavicular or cervical adenopathy. Normal sized without goiter. Respiratory WNL. No retractions.. Breath sounds WNL, No rubs, rales, rhonchi, or wheeze.. Cardiovascular Heart rhythm and rate regular, no murmur or gallop.. Pedal Pulses WNL. No clubbing, cyanosis or edema. Lymphatic No adneopathy. No adenopathy. No adenopathy. Musculoskeletal Adexa without tenderness or enlargement.. Digits and nails w/o clubbing, cyanosis, infection, petechiae, ischemia, or  inflammatory conditions.. Integumentary (Hair, Skin) No suspicious lesions. No crepitus or fluctuance. No peri-wound warmth or erythema. No masses.Marland Kitchen Psychiatric Judgement and insight Intact.. No evidence of depression, anxiety, or agitation.. Notes the wound on the left upper extremity is looking very good and except for a tiny area which has maybe half a centimeter in diameter open the rest of it is healed very well. No sharp debridement was required today. Electronic Signature(s) Signed: 04/21/2016 4:07:54 PM By: [name], FACS Entered By: [name] on 04/21/2016 16:07:53 [name] ([id number]) -------------------------------------------------------------------------------- Physician Orders Details Patient Name: [name], [name] 04/21/2016 3:00 Date of Service: PM Medical Record [id number] Number: Patient Account Number: [id number] 11/17/1935 (80 y.o. Treating RN: Cornell Barman Date of Birth/Sex: Female) Other Clinician: Primary Care Physician: Halina Maidens Treating [name] Referring Physician: Halina Maidens Physician/Extender: Suella Grove in Treatment: 20 Verbal / Phone Orders: Yes Clinician: Cornell Barman Read Back and Verified: Yes Diagnosis Coding ICD-10 Coding Code Description E11.621 Type 2 diabetes mellitus with foot ulcer Z79.01 Long term (current) use of anticoagulants S41.112A Laceration without foreign body of left upper arm, initial encounter S41.102A Unspecified open wound of left upper arm, initial encounter Wound Cleansing Wound #9 Left Upper Arm o Clean wound with Normal Saline. Primary Wound Dressing Wound #9 Left Upper Arm o Cutimed Siltec Sorbact Dressing Change Frequency Wound #9 Left Upper Arm o Change dressing every week Follow-up Appointments Wound #9 Left Upper Arm o Return Appointment in 1 week. Edema Control Wound #9 Left Upper Arm o Other: - [facility] Wound #9 Left Upper Arm o D/C Home Health  Services - [facility] -thank you for providing wound care services to our patient! [name], [name] ([id number]) Electronic Signature(s) Signed: 04/21/2016 4:48:58 PM By: Gretta Cool RN, BSN, Kim RN, BSN Signed: 04/24/2016 4:28:36 PM By: [name], FACS Entered By: Gretta Cool RN, BSN, Kim on 04/21/2016 16:26:14 [name] ([id number]) -------------------------------------------------------------------------------- Problem List Details Patient Name: [name], [name] 04/21/2016 3:00 Date of Service: PM Medical Record [id number] Number: Patient Account Number: [id number] January 01, 1936 (80 y.o. Treating RN: Cornell Barman Date of Birth/Sex: Female) Other  Clinician: Primary Care Physician: Halina Maidens Treating [name] Referring Physician: Halina Maidens Physician/Extender: Suella Grove in Treatment: 20 Active Problems ICD-10 Encounter Code Description Active Date Diagnosis E11.621 Type 2 diabetes mellitus with foot ulcer 12/02/2015 Yes Z79.01 Long term (current) use of anticoagulants 12/02/2015 Yes S41.112A Laceration without foreign body of left upper arm, initial 02/28/2016 Yes encounter S41.102A Unspecified open wound of left upper arm, initial 02/28/2016 Yes encounter Inactive Problems Resolved Problems ICD-10 Code Description Active Date Resolved Date L89.613 Pressure ulcer of right heel, stage 3 12/02/2015 12/02/2015 L97.212 Non-pressure chronic ulcer of right calf with fat layer 12/02/2015 12/02/2015 exposed L97.211 Non-pressure chronic ulcer of right calf limited to 12/02/2015 12/02/2015 breakdown of skin [name], [name] ([id number]) Electronic Signature(s) Signed: 04/21/2016 4:07:13 PM By: [name], FACS Entered By: [name] on 04/21/2016 16:07:13 [name] ([id number]) -------------------------------------------------------------------------------- Progress Note Details Patient Name: [name] 04/21/2016 3:00 Date of Service: PM Medical  Record [id number] Number: Patient Account Number: [id number] [birth year] (80 y.o. Treating RN: Cornell Barman Date of Birth/Sex: Female) Other Clinician: Primary Care Physician: Halina Maidens Treating [name] Referring Physician: Halina Maidens Physician/Extender: Suella Grove in Treatment: 20 Subjective Chief Complaint Information obtained from Patient Patient is at the clinic for treatment of an open pressure ulcer to her right calcaneum which has been there for about 4 weeks History of Present Illness (HPI) The following HPI elements were documented for the patient's wound: Location: right posterior heel, and both lower extremities Quality: Patient reports experiencing a sharp pain to affected area(s). Severity: Patient states wound (s) are getting better. Duration: Patient has had the wound for < 4 weeks prior to presenting for treatment Timing: Pain in wound is Intermittent (comes and goes Context: The wound appeared gradually over time while she was in hospital with a right hip fracture Modifying Factors: Consults to this date include:was seen by her PCP and put on some antibiotics a while ago Associated Signs and Symptoms: Patient reports having difficulty standing for long periods. 80 year old patient referred who was recently discharged in April of this year with a left heel ulcer now comes with a right heel ulcer and some ulcerations in both lower extremities which have all come along during her recent hospitalization which she had a right hip fracture. She has a past medical history of COPD, diabetes mellitus type 2 with chronic kidney disease, multi-infarct dementia, anxiety, urinary retention, paroxysmal atrial fibrillation. She has also had moderate malnutrition, multi-infarct dementia, congestive heart failure, left displaced femoral neck fracture, systemic lupus erythematosus. She is also status total abdominal hysterectomy with bilateral salpingo-oophorectomy, EGDs, hip  arthroplasty on the left, cataract surgery. She has been a from a smoker and quit in May 2013 and she smoked for about 40 years. recently admitted to [facility] between January 19 and 07/03/2015 12/09/2015 -- right foot x-ray -- IMPRESSION:Soft tissue ulceration of the heel. No underlying erosive bony abnormality. Diffuse degenerative change. No acute bony abnormality. 12/30/2015-- admitted to the hospital between July 12 and July 18 of 2017 and was treated for cerebral infarction, infected decubitus ulcer and aspiration pneumonia of the right lung. MRI was negative for stroke and was thought to be secondary to pneumonia. She was treated with clindamycin. Patient was discharged home on Keflex and Zyvox. [name] ([id number]) Dr. Lucky Cowboy took her for an aortogram and a right lower extremity runoff and found to 30% stenosis in the mid to distal SFA but no significant stenosis in the popliteal artery and the  SFA. There was a two-vessel runoff through the anterior tibial artery and peroneal artery without significant stenosis. felt her perfusion was adequate for wound healing. 02/28/2016 -- days ago she had a lacerated wound on her left forearm which they have been treating locally and have asked me to take a look today. 03/13/16: returns today for f/u. the right posterior heel wound has achieved epithelialization. the left forearm wound is healing without issue. no systemic s/s of infection. 03/20/16: returns today for f/u of a left forearm wound. husband reports dressing slides down. no reports of systemic s/s of infection. no new wounds or skin breakdown. 04/03/2016 -- she has had another laceration with a skin flap on the left upper arm and she also has one on the anterior part of the left forearm. Objective Constitutional Pulse regular. Respirations normal and unlabored. Afebrile. Vitals Time Taken: 3:34 PM, Height: 64 in, Weight: 110 lbs, BMI: 18.9, Temperature:  97.9 F, Pulse: 63 bpm, Respiratory Rate: 16 breaths/min, Blood Pressure: 136/56 mmHg. Eyes Nonicteric. Reactive to light. Ears, Nose, Mouth, and Throat Lips, teeth, and gums WNL.Marland Kitchen Moist mucosa without lesions. Neck supple and nontender. No palpable supraclavicular or cervical adenopathy. Normal sized without goiter. Respiratory WNL. No retractions.. Breath sounds WNL, No rubs, rales, rhonchi, or wheeze.. Cardiovascular Heart rhythm and rate regular, no murmur or gallop.. Pedal Pulses WNL. No clubbing, cyanosis or edema. Lymphatic No adneopathy. No adenopathy. No adenopathy. Musculoskeletal Adexa without tenderness or enlargement.. Digits and nails w/o clubbing, cyanosis, infection, petechiae, ischemia, or inflammatory conditions.Marland Kitchen [name], [name] ([id number]) Psychiatric Judgement and insight Intact.. No evidence of depression, anxiety, or agitation.. General Notes: the wound on the left upper extremity is looking very good and except for a tiny area which has maybe half a centimeter in diameter open the rest of it is healed very well. No sharp debridement was required today. Integumentary (Hair, Skin) No suspicious lesions. No crepitus or fluctuance. No peri-wound warmth or erythema. No masses.. Wound #7 status is Healed - Epithelialized. Original cause of wound was Trauma. The wound is located on the Left,Anterior Forearm. The wound measures 0cm length x 0cm width x 0cm depth; 0cm^2 area and 0cm^3 volume. Wound #9 status is Open. Original cause of wound was Trauma. The wound is located on the Left Upper Arm. The wound measures 0.7cm length x 0.5cm width x 0.1cm depth; 0.275cm^2 area and 0.027cm^3 volume. Assessment Active Problems ICD-10 E11.621 - Type 2 diabetes mellitus with foot ulcer Z79.01 - Long term (current) use of anticoagulants S41.112A - Laceration without foreign body of left upper arm, initial encounter S41.102A - Unspecified open wound of left upper arm,  initial encounter Plan Wound Cleansing: Wound #9 Left Upper Arm: Clean wound with Normal Saline. Primary Wound Dressing: Wound #9 Left Upper Arm: Cutimed Siltec Sorbact Dressing Change Frequency: Wound #9 Left Upper Arm: Change dressing every week Follow-up Appointments: [name], [name] ([id number]) Wound #9 Left Upper Arm: Return Appointment in 1 week. Edema Control: Wound #9 Left Upper Arm: Other: - [place]: Wound #9 Left Upper Arm: D/C Home [facility] Services - [facility] -thank you for providing wound care services to our patient! At this stage she has had good healing and I will use a piece of Siltec Sorbact foam and cover it lightly with a [place] and leave it on for the entire week Electronic Signature(s) Signed: 04/24/2016 8:34:06 AM By: Gretta Cool RN, BSN, Kim RN, BSN Signed: 04/24/2016 4:28:36 PM By: [name], FACS Previous Signature: 04/21/2016 4:08:24 PM  Version By: [name], FACS Entered By: Gretta Cool RN, BSN, Kim on 04/24/2016 08:34:06 [name] ([id number]) -------------------------------------------------------------------------------- [place] Details Patient Name: [name] Date of Service: 04/21/2016 Medical Record Number: [id number] Patient Account Number: [id number] Date of Birth/Sex: August 04, 1935 (80 y.o. Female) Treating RN: Cornell Barman Primary Care Physician: Halina Maidens Other Clinician: Referring Physician: Halina Maidens Treating Physician/Extender: Frann Rider in Treatment: 20 Diagnosis Coding ICD-10 Codes Code Description E11.621 Type 2 diabetes mellitus with foot ulcer Z79.01 Long term (current) use of anticoagulants S41.112A Laceration without foreign body of left upper arm, initial encounter S41.102A Unspecified open wound of left upper arm, initial encounter Facility Procedures CPT4 Code: [id number] Description: [id number] - WOUND CARE VISIT-LEV 2 EST PT Modifier: Quantity: 1 Physician Procedures CPT4  Code Description: [id number] - WC PHYS LEVEL 3 - EST PT ICD-10 Description Diagnosis E11.621 Type 2 diabetes mellitus with foot ulcer Z79.01 Long term (current) use of anticoagulants S41.112A Laceration without foreign body of left upper arm,  S41.102A Unspecified open wound of left upper arm, initial e Modifier: initial encount ncounter Quantity: 1 er Engineer, maintenance) Signed: 04/21/2016 4:48:58 PM By: Gretta Cool, RN, BSN, Kim RN, BSN Signed: 04/24/2016 4:28:36 PM By: [name], FACS Previous Signature: 04/21/2016 4:08:42 PM Version By: [name], FACS Entered By: Gretta Cool RN, BSN, Kim on 04/21/2016 16:27:13

## 2016-04-22 NOTE — Progress Notes (Signed)
Madison, [name] ([id number]) Visit Report for 04/21/2016 Arrival Information Details Patient Name: [name], [name]. Date of Service: 04/21/2016 3:00 PM Medical Record Number: [id number] Patient Account Number: [id number] Date of Birth/Sex: 1935/11/18 (79 y.o. Female) Treating RN: Cornell Barman Primary Care Physician: Halina Maidens Other Clinician: Referring Physician: Halina Maidens Treating Physician/Extender: Frann Rider in Treatment: 11 Visit Information History Since Last Visit Added or deleted any medications: No Patient Arrived: Wheel Chair Any new allergies or adverse reactions: No Arrival Time: 15:24 Had a fall or experienced change in No Accompanied By: caregiver and activities of daily living that may affect husband risk of falls: Transfer Assistance: Manual Signs or symptoms of abuse/neglect since last No Patient Identification Verified: Yes visito Secondary Verification Process Yes Hospitalized since last visit: No Completed: Has Dressing in Place as Prescribed: Yes Patient Requires Transmission- No Pain Present Now: No Based Precautions: Patient Has Alerts: Yes Patient Alerts: Patient on Blood Thinner Electronic Signature(s) Signed: 04/21/2016 4:48:58 PM By: Gretta Cool, RN, BSN, Kim RN, BSN Entered By: Gretta Cool, RN, BSN, Kim on 04/21/2016 15:24:26 [name] ([id number]) -------------------------------------------------------------------------------- Clinic Level of Care Assessment Details Patient Name: [name]. Date of Service: 04/21/2016 3:00 PM Medical Record Number: [id number] Patient Account Number: [id number] Date of Birth/Sex: [birth year] (79 y.o. Female) Treating RN: Cornell Barman Primary Care Physician: Halina Maidens Other Clinician: Referring Physician: Halina Maidens Treating Physician/Extender: Frann Rider in Treatment: 20 Clinic Level of Care Assessment Items TOOL 4 Quantity Score []  - Use when only an EandM is  performed on FOLLOW-UP visit 0 ASSESSMENTS - Nursing Assessment / Reassessment []  - Reassessment of Co-morbidities (includes updates in patient status) 0 X - Reassessment of Adherence to Treatment Plan 1 5 ASSESSMENTS - Wound and Skin Assessment / Reassessment X - Simple Wound Assessment / Reassessment - one wound 1 5 []  - Complex Wound Assessment / Reassessment - multiple wounds 0 []  - Dermatologic / Skin Assessment (not related to wound area) 0 ASSESSMENTS - Focused Assessment []  - Circumferential Edema Measurements - multi extremities 0 []  - Nutritional Assessment / Counseling / Intervention 0 []  - Lower Extremity Assessment (monofilament, tuning fork, pulses) 0 []  - Peripheral Arterial Disease Assessment (using hand held doppler) 0 ASSESSMENTS - Ostomy and/or Continence Assessment and Care []  - Incontinence Assessment and Management 0 []  - Ostomy Care Assessment and Management (repouching, etc.) 0 PROCESS - Coordination of Care X - Simple Patient / Family Education for ongoing care 1 15 []  - Complex (extensive) Patient / Family Education for ongoing care 0 X - Staff obtains Programmer, systems, Records, Test Results / Process Orders 1 10 []  - Staff telephones HHA, Nursing Homes / Clarify orders / etc 0 []  - Routine Transfer to another Facility (non-emergent condition) 0 ESABEL, TESMER ([id number]) []  - Routine Hospital Admission (non-emergent condition) 0 []  - New Admissions / Biomedical engineer / Ordering NPWT, Apligraf, etc. 0 []  - Emergency Hospital Admission (emergent condition) 0 X - Simple Discharge Coordination 1 10 []  - Complex (extensive) Discharge Coordination 0 PROCESS - Special Needs []  - Pediatric / Minor Patient Management 0 []  - Isolation Patient Management 0 []  - Hearing / Language / Visual special needs 0 []  - Assessment of Community assistance (transportation, D/C planning, etc.) 0 []  - Additional assistance / Altered mentation 0 []  - Support Surface(s) Assessment  (bed, cushion, seat, etc.) 0 INTERVENTIONS - Wound Cleansing / Measurement X - Simple Wound Cleansing - one wound 1 5 []  - Complex Wound Cleansing - multiple wounds  0 X - Wound Imaging (photographs - any number of wounds) 1 5 []  - Wound Tracing (instead of photographs) 0 X - Simple Wound Measurement - one wound 1 5 []  - Complex Wound Measurement - multiple wounds 0 INTERVENTIONS - Wound Dressings X - Small Wound Dressing one or multiple wounds 1 10 []  - Medium Wound Dressing one or multiple wounds 0 []  - Large Wound Dressing one or multiple wounds 0 []  - Application of Medications - topical 0 []  - Application of Medications - injection 0 INTERVENTIONS - Miscellaneous []  - External ear exam 0 ROBI, EAVEY ([id number]) []  - Specimen Collection (cultures, biopsies, blood, body fluids, etc.) 0 []  - Specimen(s) / Culture(s) sent or taken to Lab for analysis 0 []  - Patient Transfer (multiple staff / Harrel Lemon Lift / Similar devices) 0 []  - Simple Staple / Suture removal (25 or less) 0 []  - Complex Staple / Suture removal (26 or more) 0 []  - Hypo / Hyperglycemic Management (close monitor of Blood Glucose) 0 []  - Ankle / Brachial Index (ABI) - do not check if billed separately 0 X - Vital Signs 1 5 Has the patient been seen at the hospital within the last three years: Yes Total Score: 75 Level Of Care: New/Established - Level 2 Electronic Signature(s) Signed: 04/21/2016 4:48:58 PM By: Gretta Cool, RN, BSN, Kim RN, BSN Entered By: Gretta Cool, RN, BSN, Kim on 04/21/2016 16:26:53 [name] ([id number]) -------------------------------------------------------------------------------- Encounter Discharge Information Details Patient Name: [name]. Date of Service: 04/21/2016 3:00 PM Medical Record Number: [id number] Patient Account Number: [id number] Date of Birth/Sex: 11-Dec-1935 (79 y.o. Female) Treating RN: Cornell Barman Primary Care Physician: Halina Maidens Other Clinician: Referring  Physician: Halina Maidens Treating Physician/Extender: Frann Rider in Treatment: 20 Encounter Discharge Information Items Discharge Pain Level: 0 Discharge Condition: Stable Ambulatory Status: Wheelchair Discharge Destination: Home Transportation: Private Auto caregiver, Accompanied By: husband Schedule Follow-up Appointment: Yes Medication Reconciliation completed and provided to Patient/Care Yes Ryelee Albee: Provided on Clinical Summary of Care: 04/21/2016 Form Type Recipient Paper Patient LK Electronic Signature(s) Signed: 04/21/2016 4:29:57 PM By: Ruthine Dose Entered By: Ruthine Dose on 04/21/2016 16:29:57 [name] ([id number]) -------------------------------------------------------------------------------- Multi Wound Chart Details Patient Name: [name] Date of Service: 04/21/2016 3:00 PM Medical Record Number: [id number] Patient Account Number: [id number] Date of Birth/Sex: 1936-03-20 (79 y.o. Female) Treating RN: Cornell Barman Primary Care Physician: Halina Maidens Other Clinician: Referring Physician: Halina Maidens Treating Physician/Extender: Frann Rider in Treatment: 20 Vital Signs Height(in): 64 Pulse(bpm): 63 Weight(lbs): 110 Blood Pressure 136/56 (mmHg): Body Mass Index(BMI): 19 Temperature(F): 97.9 Respiratory Rate 16 (breaths/min): Photos: [7:No Photos] [9:No Photos] [N/A:N/A] Wound Location: [7:Left, Anterior Forearm] [9:Left Upper Arm] [N/A:N/A] Wounding Event: [7:Trauma] [9:Trauma] [N/A:N/A] Primary Etiology: [7:Trauma, Other] [9:Trauma, Other] [N/A:N/A] Date Acquired: [7:02/24/2016] [9:04/02/2016] [N/A:N/A] Weeks of Treatment: [7:7] [9:2] [N/A:N/A] Wound Status: [7:Healed - Epithelialized] [9:Open] [N/A:N/A] Measurements L x W x D 0x0x0 [9:0.7x0.5x0.1] [N/A:N/A] (cm) Area (cm) : [7:0] [9:0.275] [N/A:N/A] Volume (cm) : [7:0] [9:0.027] [N/A:N/A] % Reduction in Area: [7:100.00%] [9:95.60%] [N/A:N/A] %  Reduction in Volume: 100.00% [9:95.70%] [N/A:N/A] Classification: [7:Partial Thickness] [9:Partial Thickness] [N/A:N/A] Periwound Skin Texture: No Abnormalities Noted [9:No Abnormalities Noted] [N/A:N/A] Periwound Skin [7:No Abnormalities Noted] [9:No Abnormalities Noted] [N/A:N/A] Moisture: Periwound Skin Color: No Abnormalities Noted [9:No Abnormalities Noted] [N/A:N/A] Tenderness on [7:No] [9:No] [N/A:N/A] Treatment Notes Electronic Signature(s) Signed: 04/21/2016 4:48:58 PM By: Gretta Cool, RN, BSN, Kim RN, BSN Entered By: Gretta Cool, RN, BSN, Kim on 04/21/2016 15:37:28 [name] ([id number]) -------------------------------------------------------------------------------- Multi-Disciplinary  Care Plan Details Patient Name: ALTHERIA, VONG. Date of Service: 04/21/2016 3:00 PM Medical Record Number: [id number] Patient Account Number: [id number] Date of Birth/Sex: [birth year] (79 y.o. Female) Treating RN: Cornell Barman Primary Care Physician: Halina Maidens Other Clinician: Referring Physician: Halina Maidens Treating Physician/Extender: Frann Rider in Treatment: 20 Active Inactive Abuse / Safety / Falls / Self Care Management Nursing Diagnoses: Potential for falls Goals: Patient/caregiver will verbalize understanding of skin care regimen Date Initiated: 12/02/2015 Goal Status: Active Patient/caregiver will verbalize/demonstrate measures taken to prevent injury and/or falls Date Initiated: 12/02/2015 Goal Status: Active Interventions: Assess fall risk on admission and as needed Assess: immobility, friction, shearing, incontinence upon admission and as needed Assess impairment of mobility on admission and as needed per policy Assess self care needs on admission and as needed Provide education on fall prevention Provide education on safe transfers Notes: Wound/Skin Impairment Nursing Diagnoses: Impaired tissue integrity Goals: Patient/caregiver will verbalize  understanding of skin care regimen Date Initiated: 12/02/2015 Goal Status: Active Interventions: Assess patient/caregiver ability to obtain necessary supplies Assess patient/caregiver ability to perform ulcer/skin care regimen upon admission and as needed BRYNNLEA, SNOWDEN ([id number]) Assess ulceration(s) every visit Provide education on ulcer and skin care Notes: Electronic Signature(s) Signed: 04/21/2016 4:48:58 PM By: Gretta Cool, RN, BSN, Kim RN, BSN Entered By: Gretta Cool, RN, BSN, Kim on 04/21/2016 15:37:21 [name] ([id number]) -------------------------------------------------------------------------------- Pain Assessment Details Patient Name: [name] Date of Service: 04/21/2016 3:00 PM Medical Record Number: [id number] Patient Account Number: [id number] Date of Birth/Sex: 01-Dec-1935 (79 y.o. Female) Treating RN: Cornell Barman Primary Care Physician: Halina Maidens Other Clinician: Referring Physician: Halina Maidens Treating Physician/Extender: Frann Rider in Treatment: 20 Active Problems Location of Pain Severity and Description of Pain Patient Has Paino No Site Locations With Dressing Change: No Pain Management and Medication Current Pain Management: Electronic Signature(s) Signed: 04/21/2016 4:48:58 PM By: Gretta Cool, RN, BSN, Kim RN, BSN Entered By: Gretta Cool, RN, BSN, Kim on 04/21/2016 15:24:40 [name] ([id number]) -------------------------------------------------------------------------------- Patient/Caregiver Education Details Patient Name: [name] Date of Service: 04/21/2016 3:00 PM Medical Record Number: [id number] Patient Account Number: [id number] Date of Birth/Gender: 1935-10-17 (80 y.o. Female) Treating RN: Cornell Barman Primary Care Physician: Halina Maidens Other Clinician: Referring Physician: Halina Maidens Treating Physician/Extender: Frann Rider in Treatment: 20 Education Assessment Education Provided  To: Patient Education Topics Provided Wound/Skin Impairment: Handouts: Caring for Your Ulcer Methods: Explain/Verbal Responses: State content correctly Electronic Signature(s) Signed: 04/21/2016 4:48:58 PM By: Gretta Cool, RN, BSN, Kim RN, BSN Entered By: Gretta Cool, RN, BSN, Kim on 04/21/2016 16:28:26 [name] ([id number]) -------------------------------------------------------------------------------- Wound Assessment Details Patient Name: [name] Date of Service: 04/21/2016 3:00 PM Medical Record Number: [id number] Patient Account Number: [id number] Date of Birth/Sex: 15-Aug-1935 (79 y.o. Female) Treating RN: Cornell Barman Primary Care Physician: Halina Maidens Other Clinician: Referring Physician: Halina Maidens Treating Physician/Extender: Frann Rider in Treatment: 20 Wound Status Wound Number: 7 Primary Etiology: Trauma, Other Wound Location: Left, Anterior Forearm Wound Status: Healed - Epithelialized Wounding Event: Trauma Date Acquired: 02/24/2016 Weeks Of Treatment: 7 Clustered Wound: No Wound Measurements Length: (cm) 0 Width: (cm) 0 Depth: (cm) 0 Area: (cm) 0 Volume: (cm) 0 % Reduction in Area: 100% % Reduction in Volume: 100% Wound Description Classification: Partial Thickness Periwound Skin Texture Texture Color No Abnormalities Noted: No No Abnormalities Noted: No Moisture No Abnormalities Noted: No Electronic Signature(s) Signed: 04/21/2016 4:48:58 PM By: Gretta Cool, RN, BSN, Kim RN, BSN Entered By: Gretta Cool, RN, BSN, Kim on 04/21/2016 15:29:42  MIRIELLE, EATHERLY ([id number]) -------------------------------------------------------------------------------- Wound Assessment Details Patient Name: PRINCES, SABBATH. Date of Service: 04/21/2016 3:00 PM Medical Record Number: [id number] Patient Account Number: [id number] Date of Birth/Sex: [birth year] (79 y.o. Female) Treating RN: Cornell Barman Primary Care Physician: Halina Maidens Other  Clinician: Referring Physician: Halina Maidens Treating Physician/Extender: Frann Rider in Treatment: 20 Wound Status Wound Number: 9 Primary Etiology: Trauma, Other Wound Location: Left Upper Arm Wound Status: Open Wounding Event: Trauma Date Acquired: 04/02/2016 Weeks Of Treatment: 2 Clustered Wound: No Wound Measurements Length: (cm) 0.7 Width: (cm) 0.5 Depth: (cm) 0.1 Area: (cm) 0.275 Volume: (cm) 0.027 % Reduction in Area: 95.6% % Reduction in Volume: 95.7% Wound Description Classification: Partial Thickness Periwound Skin Texture Texture Color No Abnormalities Noted: No No Abnormalities Noted: No Moisture No Abnormalities Noted: No Treatment Notes Wound #9 (Left Upper Arm) 1. Cleansed with: Clean wound with Normal Saline 2. Anesthetic Topical Lidocaine 4% cream to wound bed prior to debridement 4. Dressing Applied: Other dressing (specify in notes) Notes [employer] sorbact, Secured with Event organiser) Signed: 04/21/2016 4:48:58 PM By: Gretta Cool, RN, BSN, Kim RN, BSN Entered By: Gretta Cool, RN, BSN, Kim on 04/21/2016 15:29:43 [name] ([id number]) [name] ([id number]) -------------------------------------------------------------------------------- Jay Details Patient Name: [name] Date of Service: 04/21/2016 3:00 PM Medical Record Number: [id number] Patient Account Number: [id number] Date of Birth/Sex: 03/27/1936 (79 y.o. Female) Treating RN: Cornell Barman Primary Care Physician: Halina Maidens Other Clinician: Referring Physician: Halina Maidens Treating Physician/Extender: Frann Rider in Treatment: 20 Vital Signs Time Taken: 15:34 Temperature (F): 97.9 Height (in): 64 Pulse (bpm): 63 Weight (lbs): 110 Respiratory Rate (breaths/min): 16 Body Mass Index (BMI): 18.9 Blood Pressure (mmHg): 136/56 Reference Range: 80 - 120 mg / dl Electronic Signature(s) Signed: 04/21/2016 4:48:58 PM By: Gretta Cool,  RN, BSN, Kim RN, BSN Entered By: Gretta Cool, RN, BSN, Kim on 04/21/2016 15:24:58

## 2016-04-27 DIAGNOSIS — I5032 Chronic diastolic (congestive) heart failure: Secondary | ICD-10-CM | POA: Diagnosis not present

## 2016-04-27 DIAGNOSIS — F0390 Unspecified dementia without behavioral disturbance: Secondary | ICD-10-CM | POA: Diagnosis not present

## 2016-04-27 DIAGNOSIS — S50812D Abrasion of left forearm, subsequent encounter: Secondary | ICD-10-CM | POA: Diagnosis not present

## 2016-04-27 DIAGNOSIS — E1142 Type 2 diabetes mellitus with diabetic polyneuropathy: Secondary | ICD-10-CM | POA: Diagnosis not present

## 2016-04-27 DIAGNOSIS — S50811D Abrasion of right forearm, subsequent encounter: Secondary | ICD-10-CM | POA: Diagnosis not present

## 2016-04-27 DIAGNOSIS — J449 Chronic obstructive pulmonary disease, unspecified: Secondary | ICD-10-CM | POA: Diagnosis not present

## 2016-04-28 ENCOUNTER — Encounter: Payer: Medicare Other | Admitting: Surgery

## 2016-04-28 DIAGNOSIS — E1122 Type 2 diabetes mellitus with diabetic chronic kidney disease: Secondary | ICD-10-CM | POA: Diagnosis not present

## 2016-04-28 DIAGNOSIS — S41112A Laceration without foreign body of left upper arm, initial encounter: Secondary | ICD-10-CM | POA: Diagnosis not present

## 2016-04-28 DIAGNOSIS — S41102A Unspecified open wound of left upper arm, initial encounter: Secondary | ICD-10-CM | POA: Diagnosis not present

## 2016-04-28 DIAGNOSIS — Z7901 Long term (current) use of anticoagulants: Secondary | ICD-10-CM | POA: Diagnosis not present

## 2016-04-28 DIAGNOSIS — J449 Chronic obstructive pulmonary disease, unspecified: Secondary | ICD-10-CM | POA: Diagnosis not present

## 2016-04-28 DIAGNOSIS — F0390 Unspecified dementia without behavioral disturbance: Secondary | ICD-10-CM | POA: Diagnosis not present

## 2016-04-28 DIAGNOSIS — E11621 Type 2 diabetes mellitus with foot ulcer: Secondary | ICD-10-CM | POA: Diagnosis not present

## 2016-04-29 NOTE — Progress Notes (Addendum)
DESHANTE, AHUMADA ([id number]) Visit Report for 04/28/2016 Chief Complaint Document Details Patient Name: [name], [name] 04/28/2016 2:15 Date of Service: PM Medical Record [id number] Number: Patient Account Number: [id number] [birth year] (80 y.o. Treating RN: Ahmed Prima Date of Birth/Sex: Female) Other Clinician: Primary Care Physician: Halina Maidens Treating [name] Referring Physician: Halina Maidens Physician/Extender: Suella Grove in Treatment: 21 Information Obtained from: Patient Chief Complaint Patient is at the clinic for treatment of an open pressure ulcer to her right calcaneum which has been there for about 4 weeks Electronic Signature(s) Signed: 04/28/2016 2:52:46 PM By: [name], FACS Entered By: [name] on 04/28/2016 14:52:46 [name] ([id number]) -------------------------------------------------------------------------------- HPI Details Patient Name: SIMRAT, GRGAS 04/28/2016 2:15 Date of Service: PM Medical Record [id number] Number: Patient Account Number: [id number] [birth year] (80 y.o. Treating RN: Ahmed Prima Date of Birth/Sex: Female) Other Clinician: Primary Care Physician: Halina Maidens Treating [name] Referring Physician: Halina Maidens Physician/Extender: Suella Grove in Treatment: 21 History of Present Illness Location: right posterior heel, and both lower extremities Quality: Patient reports experiencing a sharp pain to affected area(s). Severity: Patient states wound (s) are getting better. Duration: Patient has had the wound for < 4 weeks prior to presenting for treatment Timing: Pain in wound is Intermittent (comes and goes Context: The wound appeared gradually over time while she was in hospital with a right hip fracture Modifying Factors: Consults to this date include:was seen by her PCP and put on some antibiotics a while ago Associated Signs and Symptoms: Patient reports having difficulty standing  for long periods. HPI Description: 80 year old patient referred who was recently discharged in April of this year with a left heel ulcer now comes with a right heel ulcer and some ulcerations in both lower extremities which have all come along during her recent hospitalization which she had a right hip fracture. She has a past medical history of COPD, diabetes mellitus type 2 with chronic kidney disease, multi-infarct dementia, anxiety, urinary retention, paroxysmal atrial fibrillation. She has also had moderate malnutrition, multi-infarct dementia, congestive heart failure, left displaced femoral neck fracture, systemic lupus erythematosus. She is also status total abdominal hysterectomy with bilateral salpingo-oophorectomy, EGDs, hip arthroplasty on the left, cataract surgery. She has been a from a smoker and quit in May 2013 and she smoked for about 40 years. recently admitted to [facility] between January 19 and 07/03/2015 12/09/2015 -- o right foot x-ray -- IMPRESSION:Soft tissue ulceration of the heel. No underlying erosive bony abnormality. Diffuse degenerative change. No acute bony abnormality. 12/30/2015-- admitted to the hospital between July 12 and July 18 of 2017 and was treated for cerebral infarction, infected decubitus ulcer and aspiration pneumonia of the right lung. MRI was negative for stroke and was thought to be secondary to pneumonia. She was treated with clindamycin. Patient was discharged home on Keflex and Zyvox. Dr. Lucky Cowboy took her for an aortogram and a right lower extremity runoff and found to 30% stenosis in the mid to distal SFA but no significant stenosis in the popliteal artery and the SFA. There was a two-vessel runoff through the anterior tibial artery and peroneal artery without significant stenosis. felt her perfusion was adequate for wound healing. 02/28/2016 -- days ago she had a lacerated wound on her left forearm which they have been  treating locally and have asked me to take a look today. 03/13/16: returns today for f/u. the right posterior heel wound has achieved epithelialization. the left forearm wound is healing without issue. no systemic  s/s of infection. AMBRI, DA ([id number]) 03/20/16: returns today for f/u of a left forearm wound. husband reports dressing slides down. no reports of systemic s/s of infection. no new wounds or skin breakdown. 04/03/2016 -- she has had another laceration with a skin flap on the left upper arm and she also has one on the anterior part of the left forearm. Electronic Signature(s) Signed: 04/28/2016 2:52:51 PM By: [name], FACS Entered By: [name] on 04/28/2016 14:52:51 [name] ([id number]) -------------------------------------------------------------------------------- Physical Exam Details Patient Name: SAMMIJO, NAVAL 04/28/2016 2:15 Date of Service: PM Medical Record [id number] Number: Patient Account Number: [id number] Mar 21, 1936 (80 y.o. Treating RN: Ahmed Prima Date of Birth/Sex: Female) Other Clinician: Primary Care Physician: Halina Maidens Treating [name] Referring Physician: Halina Maidens Physician/Extender: Suella Grove in Treatment: 21 Constitutional . Pulse regular. Respirations normal and unlabored. Afebrile. . Eyes Nonicteric. Reactive to light. Ears, Nose, Mouth, and Throat Lips, teeth, and gums WNL.Marland Kitchen Moist mucosa without lesions. Neck supple and nontender. No palpable supraclavicular or cervical adenopathy. Normal sized without goiter. Respiratory WNL. No retractions.. Cardiovascular Pedal Pulses WNL. No clubbing, cyanosis or edema. Lymphatic No adneopathy. No adenopathy. No adenopathy. Musculoskeletal Adexa without tenderness or enlargement.. Digits and nails w/o clubbing, cyanosis, infection, petechiae, ischemia, or inflammatory conditions.. Integumentary (Hair, Skin) No suspicious lesions. No crepitus or  fluctuance. No peri-wound warmth or erythema. No masses.Marland Kitchen Psychiatric Judgement and insight Intact.. No evidence of depression, anxiety, or agitation.. Notes the wound on the left upper extremity is small and minimally open and the rest of it is looking very good. No sharp debridement was required today. Electronic Signature(s) Signed: 04/28/2016 2:53:24 PM By: [name], FACS Entered By: [name] on 04/28/2016 14:53:23 [name] ([id number]) -------------------------------------------------------------------------------- Physician Orders Details Patient Name: DELAIAH, PAM 04/28/2016 2:15 Date of Service: PM Medical Record [id number] Number: Patient Account Number: [id number] [birth year] (80 y.o. Treating RN: Ahmed Prima Date of Birth/Sex: Female) Other Clinician: Primary Care Physician: Halina Maidens Treating [name] Referring Physician: Halina Maidens Physician/Extender: Suella Grove in Treatment: 21 Verbal / Phone Orders: Yes Clinician: Carolyne Fiscal, Debi Read Back and Verified: Yes Diagnosis Coding Wound Cleansing Wound #9 Left Upper Arm o Clean wound with Normal Saline. Primary Wound Dressing Wound #9 Left Upper Arm o Cutimed Siltec Sorbact Dressing Change Frequency Wound #9 Left Upper Arm o Change dressing every week Follow-up Appointments Wound #9 Left Upper Arm o Return Appointment in 1 week. Edema Control Wound #9 Left Upper Arm o Other: - [facility] Wound #9 Left Upper Arm o D/C [facility] - [facility] -thank you for providing wound care services to our patient! Electronic Signature(s) Signed: 04/28/2016 4:17:50 PM By: [name], FACS Signed: 04/28/2016 4:21:18 PM By: Alric Quan Entered By: Alric Quan on 04/28/2016 14:52:47 [name] ([id number]) -------------------------------------------------------------------------------- Problem List Details Patient Name: RENALDA, KAMINER 04/28/2016 2:15 Date of Service: PM Medical Record [id number] Number: Patient Account Number: [id number] 14-Mar-1936 (80 y.o. Treating RN: Ahmed Prima Date of Birth/Sex: Female) Other Clinician: Primary Care Physician: Halina Maidens Treating [name] Referring Physician: Halina Maidens Physician/Extender: Suella Grove in Treatment: 21 Active Problems ICD-10 Encounter Code Description Active Date Diagnosis E11.621 Type 2 diabetes mellitus with foot ulcer 12/02/2015 Yes Z79.01 Long term (current) use of anticoagulants 12/02/2015 Yes S41.112A Laceration without foreign body of left upper arm, initial 02/28/2016 Yes encounter S41.102A Unspecified open wound of left upper arm, initial 02/28/2016 Yes encounter Inactive Problems Resolved Problems ICD-10 Code Description Active Date Resolved  Date L89.613 Pressure ulcer of right heel, stage 3 12/02/2015 12/02/2015 [id number] 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 Treating RN: Ahmed Prima Date of Birth/Sex: Female) Other Clinician: Primary Care Physician: Halina Maidens Treating [name] Referring Physician: Halina Maidens Physician/Extender: Suella Grove in Treatment: 21 Subjective Chief Complaint Information obtained from Patient Patient is at the clinic for treatment of an open pressure ulcer to her right calcaneum which has been  there for about 4 weeks History of Present Illness (HPI) The following HPI elements were documented for the patient's wound: Location: right posterior heel, and both lower extremities Quality: Patient reports experiencing a sharp pain to affected area(s). Severity: Patient states wound (s) are getting better. Duration: Patient has had the wound for < 4 weeks prior to presenting for treatment Timing: Pain in wound is Intermittent (comes and goes Context: The wound appeared gradually over time while she was in hospital with a right hip fracture Modifying Factors: Consults to this date include:was seen by her PCP and put on some antibiotics a while ago Associated Signs and Symptoms: Patient reports having difficulty standing for long periods. 80 year old patient referred who was recently discharged in April of this year with a left heel ulcer now comes with a right heel ulcer and some ulcerations in both lower extremities which have all come along during her recent hospitalization which she had a right hip fracture. She has a past medical history of COPD, diabetes mellitus type 2 with chronic kidney disease, multi-infarct dementia, anxiety, urinary retention, paroxysmal atrial fibrillation. She has also had moderate malnutrition, multi-infarct dementia, congestive heart failure, left displaced femoral neck fracture, systemic lupus erythematosus. She is also status total abdominal hysterectomy with bilateral salpingo-oophorectomy, EGDs, hip arthroplasty on the left, cataract surgery. She has been a from a smoker and quit in May 2013 and she smoked for about 40 years. recently admitted to [facility] between January 19 and 07/03/2015 12/09/2015 -- right foot x-ray -- IMPRESSION:Soft tissue ulceration of the heel. No underlying erosive bony abnormality. Diffuse degenerative change. No acute bony abnormality. 12/30/2015-- admitted to the hospital between July 12 and July 18 of  2017 and was treated for cerebral infarction, infected decubitus ulcer and aspiration pneumonia of the right lung. MRI was negative for stroke and was thought to be secondary to pneumonia. She was treated with clindamycin. Patient was discharged home on Keflex and Zyvox. [name] ([id number]) Dr. Lucky Cowboy took her for an aortogram and a right lower extremity runoff and found to 30% stenosis in the mid to distal SFA but no significant stenosis in the popliteal artery and the SFA. There was a two-vessel runoff through the anterior tibial artery and peroneal artery without significant stenosis. felt her perfusion was adequate for wound healing. 02/28/2016 -- days ago she had a lacerated wound on her left forearm which they have been treating locally and have asked me to take a look today. 03/13/16: returns today for f/u. the right posterior heel wound has achieved epithelialization. the left forearm wound is healing without issue. no systemic s/s of infection. 03/20/16: returns  today for f/u of a left forearm wound. husband reports dressing slides down. no reports of systemic s/s of infection. no new wounds or skin breakdown. 04/03/2016 -- she has had another laceration with a skin flap on the left upper arm and she also has one on the anterior part of the left forearm. Objective Constitutional Pulse regular. Respirations normal and unlabored. Afebrile. Vitals Time Taken: 2:25 PM, Height: 64 in, Weight: 110 lbs, BMI: 18.9, Temperature: 97.7 F, Pulse: 64 bpm, Respiratory Rate: 16 breaths/min, Blood Pressure: 147/58 mmHg. Eyes Nonicteric. Reactive to light. Ears, Nose, Mouth, and Throat Lips, teeth, and gums WNL.Marland Kitchen Moist mucosa without lesions. Neck supple and nontender. No palpable supraclavicular or cervical adenopathy. Normal sized without goiter. Respiratory WNL. No retractions.. Cardiovascular Pedal Pulses WNL. No clubbing, cyanosis or edema. Lymphatic No adneopathy. No adenopathy.  No adenopathy. Musculoskeletal Adexa without tenderness or enlargement.. Digits and nails w/o clubbing, cyanosis, infection, petechiae, ischemia, or inflammatory conditions.Marland Kitchen ARKESHA, RAYSOR ([id number]) Psychiatric Judgement and insight Intact.. No evidence of depression, anxiety, or agitation.. General Notes: the wound on the left upper extremity is small and minimally open and the rest of it is looking very good. No sharp debridement was required today. Integumentary (Hair, Skin) No suspicious lesions. No crepitus or fluctuance. No peri-wound warmth or erythema. No masses.. Wound #9 status is Open. Original cause of wound was Trauma. The wound is located on the Left Upper Arm. The wound measures 0.1cm length x 0.1cm width x 0.1cm depth; 0.008cm^2 area and 0.001cm^3 volume. There is no tunneling or undermining noted. There is a small amount of serosanguineous drainage noted. There is small (1-33%) pink granulation within the wound bed. There is a large (67-100%) amount of necrotic tissue within the wound bed including Eschar. Periwound temperature was noted as No Abnormality. The periwound has tenderness on palpation. Assessment Active Problems ICD-10 E11.621 - Type 2 diabetes mellitus with foot ulcer Z79.01 - Long term (current) use of anticoagulants S41.112A - Laceration without foreign body of left upper arm, initial encounter S41.102A - Unspecified open wound of left upper arm, initial encounter Plan Wound Cleansing: Wound #9 Left Upper Arm: Clean wound with Normal Saline. Primary Wound Dressing: Wound #9 Left Upper Arm: Cutimed Siltec Sorbact Dressing Change Frequency: Wound #9 Left Upper Arm: Change dressing every week Follow-up Appointments: Wound #9 Left Upper Arm: Return Appointment in 1 week. JALAYIA, ELIZARDO ([id number]) Edema Control: Wound #9 Left Upper Arm: Other: - [place]: Wound #9 Left Upper Arm: D/[facility] - [facility] -thank you  for providing wound care services to our patient! I will use a piece of Siltec Sorbact foam and cover it lightly with a [place] and leave it on for the entire week. next Friday she will change the dressing as he is going to be out of town and see me back the week after Electronic Signature(s) Signed: 04/28/2016 2:54:07 PM By: [name], FACS Entered By: [name] on 04/28/2016 14:54:07 [name] ([id number]) -------------------------------------------------------------------------------- SuperBill Details Patient Name: [name]. Date of Service: 04/28/2016 Medical Record Number: [id number] Patient Account Number: [id number] Date of Birth/Sex: August 18, 1935 (80 y.o. Female) Treating RN: Ahmed Prima Primary Care Physician: Halina Maidens Other Clinician: Referring Physician: Halina Maidens Treating Physician/Extender: Frann Rider in Treatment: 21 Diagnosis Coding ICD-10 Codes Code Description E11.621 Type 2 diabetes mellitus with foot ulcer Z79.01 Long term (current) use of anticoagulants S41.112A Laceration without foreign body of left upper arm, initial encounter S41.102A Unspecified open wound  of left upper arm, initial encounter Facility Procedures CPT4 Code: [id number] Description: [facility] VISIT-LEV 3 EST PT Modifier: Quantity: 1 Physician Procedures CPT4 Code: [id number] Description: [id number] - WC PHYS LEVEL 3 - EST PT ICD-10 Description Diagnosis E11.621 Type 2 diabetes mellitus with foot ulcer Z79.01 Long term (current) use of anticoagulants S41.102A Unspecified open wound of left upper arm, initial Modifier: encounter Quantity: 1 Electronic Signature(s) Signed: 04/28/2016 4:17:50 PM By: [name], FACS Signed: 04/28/2016 4:21:18 PM By: Alric Quan Previous Signature: 04/28/2016 2:54:21 PM Version By: [name], FACS Entered By: Alric Quan on 04/28/2016 14:59:38

## 2016-04-30 NOTE — Progress Notes (Signed)
CLYDE, KOTTLER ([id number]) Visit Report for 04/28/2016 Arrival Information Details Patient Name: [name], [name]. Date of Service: 04/28/2016 2:15 PM Medical Record Number: [id number] Patient Account Number: [id number] Date of Birth/Sex: 13-Jun-1935 (79 y.o. Female) Treating RN: Carolyne Fiscal, Debi Primary Care Physician: Halina Maidens Other Clinician: Referring Physician: Halina Maidens Treating Physician/Extender: Frann Rider in Treatment: 21 Visit Information History Since Last Visit All ordered tests and consults were completed: No Patient Arrived: Wheel Chair Added or deleted any medications: No Arrival Time: 14:21 Any new allergies or adverse reactions: No Accompanied By: HUSBAND AND CAREGIVER Had a fall or experienced change in No activities of daily living that may affect Transfer Assistance: EasyPivot Patient risk of falls: Lift Signs or symptoms of abuse/neglect since last No Patient Identification Verified: Yes visito Secondary Verification Yes Hospitalized since last visit: No Process Completed: Pain Present Now: No Patient Requires No Transmission-Based Precautions: Patient Has Alerts: Yes Patient Alerts: Patient on Blood Thinner Electronic Signature(s) Signed: 04/28/2016 4:21:18 PM By: Alric Quan Entered By: Alric Quan on 04/28/2016 14:25:20 [name] ([id number]) -------------------------------------------------------------------------------- Clinic Level of Care Assessment Details Patient Name: [name] Date of Service: 04/28/2016 2:15 PM Medical Record Number: [id number] Patient Account Number: [id number] Date of Birth/Sex: July 21, 1935 (79 y.o. Female) Treating RN: Carolyne Fiscal, Debi Primary Care Physician: Halina Maidens Other Clinician: Referring Physician: Halina Maidens Treating Physician/Extender: Frann Rider in Treatment: 21 Clinic Level of Care Assessment Items TOOL 4 Quantity Score X - Use when  only an EandM is performed on FOLLOW-UP visit 1 0 ASSESSMENTS - Nursing Assessment / Reassessment X - Reassessment of Co-morbidities (includes updates in patient status) 1 10 X - Reassessment of Adherence to Treatment Plan 1 5 ASSESSMENTS - Wound and Skin Assessment / Reassessment X - Simple Wound Assessment / Reassessment - one wound 1 5 []  - Complex Wound Assessment / Reassessment - multiple wounds 0 []  - Dermatologic / Skin Assessment (not related to wound area) 0 ASSESSMENTS - Focused Assessment []  - Circumferential Edema Measurements - multi extremities 0 []  - Nutritional Assessment / Counseling / Intervention 0 []  - Lower Extremity Assessment (monofilament, tuning fork, pulses) 0 []  - Peripheral Arterial Disease Assessment (using hand held doppler) 0 ASSESSMENTS - Ostomy and/or Continence Assessment and Care []  - Incontinence Assessment and Management 0 []  - Ostomy Care Assessment and Management (repouching, etc.) 0 PROCESS - Coordination of Care X - Simple Patient / Family Education for ongoing care 1 15 []  - Complex (extensive) Patient / Family Education for ongoing care 0 X - Staff obtains Programmer, systems, Records, Test Results / Process Orders 1 10 []  - Staff telephones HHA, Nursing Homes / Clarify orders / etc 0 []  - Routine Transfer to another Facility (non-emergent condition) 0 DAEJ[facility], HEMMING ([id number]) []  - Routine Hospital Admission (non-emergent condition) 0 []  - New Admissions / Biomedical engineer / Ordering NPWT, Apligraf, etc. 0 []  - Emergency Hospital Admission (emergent condition) 0 X - Simple Discharge Coordination 1 10 []  - Complex (extensive) Discharge Coordination 0 PROCESS - Special Needs []  - Pediatric / Minor Patient Management 0 []  - Isolation Patient Management 0 []  - Hearing / Language / Visual special needs 0 []  - Assessment of Community assistance (transportation, D/C planning, etc.) 0 []  - Additional assistance / Altered mentation 0 []  - Support  Surface(s) Assessment (bed, cushion, seat, etc.) 0 INTERVENTIONS - Wound Cleansing / Measurement X - Simple Wound Cleansing - one wound 1 5 []  - Complex Wound Cleansing - multiple wounds 0 X -  Wound Imaging (photographs - any number of wounds) 1 5 []  - Wound Tracing (instead of photographs) 0 X - Simple Wound Measurement - one wound 1 5 []  - Complex Wound Measurement - multiple wounds 0 INTERVENTIONS - Wound Dressings X - Small Wound Dressing one or multiple wounds 1 10 []  - Medium Wound Dressing one or multiple wounds 0 []  - Large Wound Dressing one or multiple wounds 0 X - Application of Medications - topical 1 5 []  - Application of Medications - injection 0 INTERVENTIONS - Miscellaneous []  - External ear exam 0 ALMETER, MORINE ([id number]) []  - Specimen Collection (cultures, biopsies, blood, body fluids, etc.) 0 []  - Specimen(s) / Culture(s) sent or taken to Lab for analysis 0 []  - Patient Transfer (multiple staff / Harrel Lemon Lift / Similar devices) 0 []  - Simple Staple / Suture removal (25 or less) 0 []  - Complex Staple / Suture removal (26 or more) 0 []  - Hypo / Hyperglycemic Management (close monitor of Blood Glucose) 0 []  - Ankle / Brachial Index (ABI) - do not check if billed separately 0 X - Vital Signs 1 5 Has the patient been seen at the hospital within the last three years: Yes Total Score: 90 Level Of Care: New/Established - Level 3 Electronic Signature(s) Signed: 04/28/2016 4:21:18 PM By: Alric Quan Entered By: Alric Quan on 04/28/2016 14:59:23 [name] ([id number]) -------------------------------------------------------------------------------- Encounter Discharge Information Details Patient Name: [name] Date of Service: 04/28/2016 2:15 PM Medical Record Number: [id number] Patient Account Number: [id number] Date of Birth/Sex: 12/31/1935 (79 y.o. Female) Treating RN: Ahmed Prima Primary Care Physician: Halina Maidens Other  Clinician: Referring Physician: Halina Maidens Treating Physician/Extender: Frann Rider in Treatment: 21 Encounter Discharge Information Items Discharge Pain Level: 0 Discharge Condition: Stable Ambulatory Status: Wheelchair Discharge Destination: Home Transportation: Private Auto caregiver, and Accompanied By: husband Schedule Follow-up Appointment: Yes Medication Reconciliation completed and provided to Patient/Care Yes Aymar Whitfill: Provided on Clinical Summary of Care: 04/28/2016 Form Type Recipient Paper Patient LK Electronic Signature(s) Signed: 04/28/2016 2:55:51 PM By: Ruthine Dose Entered By: Ruthine Dose on 04/28/2016 14:55:51 [name] ([id number]) -------------------------------------------------------------------------------- Lower Extremity Assessment Details Patient Name: [name] Date of Service: 04/28/2016 2:15 PM Medical Record Number: [id number] Patient Account Number: [id number] Date of Birth/Sex: [birth year] (79 y.o. Female) Treating RN: Carolyne Fiscal, Debi Primary Care Physician: Halina Maidens Other Clinician: Referring Physician: Halina Maidens Treating Physician/Extender: Frann Rider in Treatment: 21 Electronic Signature(s) Signed: 04/28/2016 4:21:18 PM By: Alric Quan Entered By: Alric Quan on 04/28/2016 14:26:02 [name] ([id number]) -------------------------------------------------------------------------------- Multi Wound Chart Details Patient Name: [name] Date of Service: 04/28/2016 2:15 PM Medical Record Number: [id number] Patient Account Number: [id number] Date of Birth/Sex: 02/17/1936 (79 y.o. Female) Treating RN: Ahmed Prima Primary Care Physician: Halina Maidens Other Clinician: Referring Physician: Halina Maidens Treating Physician/Extender: Frann Rider in Treatment: 21 Vital Signs Height(in): 64 Pulse(bpm): 64 Weight(lbs): 110 Blood  Pressure 147/58 (mmHg): Body Mass Index(BMI): 19 Temperature(F): 97.7 Respiratory Rate 16 (breaths/min): Photos: [9:No Photos] [N/A:N/A] Wound Location: [9:Left Upper Arm] [N/A:N/A] Wounding Event: [9:Trauma] [N/A:N/A] Primary Etiology: [9:Trauma, Other] [N/A:N/A] Comorbid History: [9:Chronic Obstructive Pulmonary Disease (COPD), Angina, Congestive Heart Failure, Type II Diabetes, Lupus Erythematosus, Osteoarthritis, Dementia] [N/A:N/A] Date Acquired: [9:04/02/2016] [N/A:N/A] Weeks of Treatment: [9:3] [N/A:N/A] Wound Status: [9:Open] [N/A:N/A] Measurements L x W x D 0.1x0.1x0.1 [N/A:N/A] (cm) Area (cm) : [9:0.008] [N/A:N/A] Volume (cm) : [9:0.001] [N/A:N/A] % Reduction in Area: [9:99.90%] [N/A:N/A] % Reduction in Volume: 99.80% [N/A:N/A] Classification: [9:Partial Thickness] [  N/A:N/A] Exudate Amount: [9:Small] [N/A:N/A] Exudate Type: [9:Serosanguineous] [N/A:N/A] Exudate Color: [9:red, brown] [N/A:N/A] Granulation Amount: [9:Small (1-33%)] [N/A:N/A] Granulation Quality: [9:Pink] [N/A:N/A] Necrotic Amount: [9:Large (67-100%)] [N/A:N/A] Necrotic Tissue: [9:Eschar] [N/A:N/A] Periwound Skin Texture: No Abnormalities Noted [9:No Abnormalities Noted] [N/A:N/A N/A] Periwound Skin Moisture: Periwound Skin Color: No Abnormalities Noted N/A N/A Temperature: No Abnormality N/A N/A Tenderness on Yes N/A N/A Palpation: Wound Preparation: Ulcer Cleansing: N/A N/A Rinsed/Irrigated with Saline Topical Anesthetic Applied: None Treatment Notes Electronic Signature(s) Signed: 04/28/2016 4:21:18 PM By: Alric Quan Entered By: Alric Quan on 04/28/2016 14:33:29 [name] ([id number]) -------------------------------------------------------------------------------- [place] Details Patient Name: [name]. Date of Service: 04/28/2016 2:15 PM Medical Record Number: [id number] Patient Account Number: [id number] Date of Birth/Sex: Aug 05, 1935  (79 y.o. Female) Treating RN: Carolyne Fiscal, Debi Primary Care Physician: Halina Maidens Other Clinician: Referring Physician: Halina Maidens Treating Physician/Extender: Frann Rider in Treatment: 21 Active Inactive Abuse / Safety / Falls / Self Care Management Nursing Diagnoses: Potential for falls Goals: Patient/caregiver will verbalize understanding of skin care regimen Date Initiated: 12/02/2015 Goal Status: Active Patient/caregiver will verbalize/demonstrate measures taken to prevent injury and/or falls Date Initiated: 12/02/2015 Goal Status: Active Interventions: Assess fall risk on admission and as needed Assess: immobility, friction, shearing, incontinence upon admission and as needed Assess impairment of mobility on admission and as needed per policy Assess self care needs on admission and as needed Provide education on fall prevention Provide education on safe transfers Notes: Wound/Skin Impairment Nursing Diagnoses: Impaired tissue integrity Goals: Patient/caregiver will verbalize understanding of skin care regimen Date Initiated: 12/02/2015 Goal Status: Active Interventions: Assess patient/caregiver ability to obtain necessary supplies Assess patient/caregiver ability to perform ulcer/skin care regimen upon admission and as needed ZYA, SCIANNA ([id number]) Assess ulceration(s) every visit Provide education on ulcer and skin care Notes: Electronic Signature(s) Signed: 04/28/2016 4:21:18 PM By: Alric Quan Entered By: Alric Quan on 04/28/2016 14:33:23 [name] ([id number]) -------------------------------------------------------------------------------- Pain Assessment Details Patient Name: [name] Date of Service: 04/28/2016 2:15 PM Medical Record Number: [id number] Patient Account Number: [id number] Date of Birth/Sex: [birth year] (79 y.o. Female) Treating RN: Ahmed Prima Primary Care Physician: Halina Maidens Other  Clinician: Referring Physician: Halina Maidens Treating Physician/Extender: Frann Rider in Treatment: 21 Active Problems Location of Pain Severity and Description of Pain Patient Has Paino No Site Locations With Dressing Change: No Pain Management and Medication Current Pain Management: Electronic Signature(s) Signed: 04/28/2016 4:21:18 PM By: Alric Quan Entered By: Alric Quan on 04/28/2016 14:25:27 [name] ([id number]) -------------------------------------------------------------------------------- Patient/Caregiver Education Details Patient Name: [name]. Date of Service: 04/28/2016 2:15 PM Medical Record Number: [id number] Patient Account Number: [id number] Date of Birth/Gender: Oct 08, 1935 (80 y.o. Female) Treating RN: Ahmed Prima Primary Care Physician: Halina Maidens Other Clinician: Referring Physician: Halina Maidens Treating Physician/Extender: Frann Rider in Treatment: 21 Education Assessment Education Provided To: Patient Education Topics Provided Wound/Skin Impairment: Handouts: Other: change dressing as ordered Methods: Demonstration, Explain/Verbal Responses: State content correctly Electronic Signature(s) Signed: 04/28/2016 4:21:18 PM By: Alric Quan Entered By: Alric Quan on 04/28/2016 14:53:59 [name] ([id number]) -------------------------------------------------------------------------------- Wound Assessment Details Patient Name: [name] Date of Service: 04/28/2016 2:15 PM Medical Record Number: [id number] Patient Account Number: [id number] Date of Birth/Sex: 27-May-1936 (79 y.o. Female) Treating RN: Ahmed Prima Primary Care Physician: Halina Maidens Other Clinician: Referring Physician: Halina Maidens Treating Physician/Extender: Frann Rider in Treatment: 21 Wound Status Wound Number: 9 Primary Trauma, Other Etiology: Wound Location: Left Upper  Arm Wound Open Wounding  Event: Trauma Status: Date Acquired: 04/02/2016 Comorbid Chronic Obstructive Pulmonary Disease Weeks Of Treatment: 3 History: (COPD), Angina, Congestive Heart Clustered Wound: No Failure, Type II Diabetes, Lupus Erythematosus, Osteoarthritis, Dementia Photos Photo Uploaded By: Alric Quan on 04/28/2016 15:11:05 Wound Measurements Length: (cm) 0.1 % Reduction in Width: (cm) 0.1 % Reduction in Depth: (cm) 0.1 Tunneling: Area: (cm) 0.008 Undermining: Volume: (cm) 0.001 Area: 99.9% Volume: 99.8% No No Wound Description Classification: Partial Thickness Exudate Amount: Small Exudate Type: Serosanguineous Exudate Color: red, brown Foul Odor After Cleansing: No Wound Bed Granulation Amount: Small (1-33%) Granulation Quality: Pink Necrotic Amount: Large (67-100%) Necrotic Quality: ANNALYN, BINZ ([id number]) Periwound Skin Texture Texture Color No Abnormalities Noted: No No Abnormalities Noted: No Moisture Temperature / Pain No Abnormalities Noted: No Temperature: No Abnormality Tenderness on Palpation: Yes Wound Preparation Ulcer Cleansing: Rinsed/Irrigated with Saline Topical Anesthetic Applied: None Treatment Notes Wound #9 (Left Upper Arm) 1. Cleansed with: Clean wound with Normal Saline 2. Anesthetic Topical Lidocaine 4% cream to wound bed prior to debridement 4. Dressing Applied: Other dressing (specify in notes) 5. Secondary Dressing Applied Kerlix/Conform 7. Secured with Tape Notes Art gallery manager, Secured with Event organiser) Signed: 04/28/2016 4:21:18 PM By: Alric Quan Entered By: Alric Quan on 04/28/2016 14:32:35 [name] ([id number]) -------------------------------------------------------------------------------- [place] Details Patient Name: [name] Date of Service: 04/28/2016 2:15 PM Medical Record Number: [id number] Patient Account Number: [id number] Date of  Birth/Sex: [birth year] (79 y.o. Female) Treating RN: Carolyne Fiscal, Debi Primary Care Physician: Halina Maidens Other Clinician: Referring Physician: Halina Maidens Treating Physician/Extender: Frann Rider in Treatment: 21 Vital Signs Time Taken: 14:25 Temperature (F): 97.7 Height (in): 64 Pulse (bpm): 64 Weight (lbs): 110 Respiratory Rate (breaths/min): 16 Body Mass Index (BMI): 18.9 Blood Pressure (mmHg): 147/58 Reference Range: 80 - 120 mg / dl Electronic Signature(s) Signed: 04/28/2016 4:21:18 PM By: Alric Quan Entered By: Alric Quan on 04/28/2016 14:25:44

## 2016-05-08 ENCOUNTER — Encounter: Payer: Medicare Other | Admitting: Surgery

## 2016-05-08 DIAGNOSIS — J449 Chronic obstructive pulmonary disease, unspecified: Secondary | ICD-10-CM | POA: Diagnosis not present

## 2016-05-08 DIAGNOSIS — Z7901 Long term (current) use of anticoagulants: Secondary | ICD-10-CM | POA: Diagnosis not present

## 2016-05-08 DIAGNOSIS — E11621 Type 2 diabetes mellitus with foot ulcer: Secondary | ICD-10-CM | POA: Diagnosis not present

## 2016-05-08 DIAGNOSIS — E1122 Type 2 diabetes mellitus with diabetic chronic kidney disease: Secondary | ICD-10-CM | POA: Diagnosis not present

## 2016-05-08 DIAGNOSIS — F0390 Unspecified dementia without behavioral disturbance: Secondary | ICD-10-CM | POA: Diagnosis not present

## 2016-05-08 DIAGNOSIS — S41112A Laceration without foreign body of left upper arm, initial encounter: Secondary | ICD-10-CM | POA: Diagnosis not present

## 2016-05-08 NOTE — Progress Notes (Addendum)
KUUIPO, PETITFRERE ([id number]) Visit Report for 05/08/2016 Chief Complaint Document Details Patient Name: [name], [name] 05/08/2016 1:30 Date of Service: PM Medical Record [id number] Number: Patient Account Number: [id number] [birth year] (80 y.o. Treating RN: [name] Date of Birth/Sex: Female) Other Clinician: Primary Care Physician: Halina Maidens Treating [name] Referring Physician: Halina Maidens Physician/Extender: Suella Grove in Treatment: 22 Information Obtained from: Patient Chief Complaint Patient is at the clinic for treatment of an open pressure ulcer to her right calcaneum which has been there for about 4 weeks Electronic Signature(s) Signed: 05/08/2016 2:00:41 PM By: [name], FACS Entered By: [name] on 05/08/2016 14:00:40 [name] ([id number]) -------------------------------------------------------------------------------- HPI Details Patient Name: [name], [name] 05/08/2016 1:30 Date of Service: PM Medical Record [id number] Number: Patient Account Number: [id number] 03/02/1936 (80 y.o. Treating RN: [name] Date of Birth/Sex: Female) Other Clinician: Primary Care Physician: Halina Maidens Treating [name] Referring Physician: Halina Maidens Physician/Extender: Suella Grove in Treatment: 22 History of Present Illness Location: right posterior heel, and both lower extremities Quality: Patient reports experiencing a sharp pain to affected area(s). Severity: Patient states wound (s) are getting better. Duration: Patient has had the wound for < 4 weeks prior to presenting for treatment Timing: Pain in wound is Intermittent (comes and goes Context: The wound appeared gradually over time while she was in hospital with a right hip fracture Modifying Factors: Consults to this date include:was seen by her PCP and put on some antibiotics a while ago Associated Signs and Symptoms: Patient reports having difficulty standing  for long periods. HPI Description: 80 year old patient referred who was recently discharged in April of this year with a left heel ulcer now comes with a right heel ulcer and some ulcerations in both lower extremities which have all come along during her recent hospitalization which she had a right hip fracture. She has a past medical history of COPD, diabetes mellitus type 2 with chronic kidney disease, multi-infarct dementia, anxiety, urinary retention, paroxysmal atrial fibrillation. She has also had moderate malnutrition, multi-infarct dementia, congestive heart failure, left displaced femoral neck fracture, systemic lupus erythematosus. She is also status total abdominal hysterectomy with bilateral salpingo-oophorectomy, EGDs, hip arthroplasty on the left, cataract surgery. She has been a from a smoker and quit in May 2013 and she smoked for about 40 years. recently admitted to [facility] between January 19 and 07/03/2015 12/09/2015 -- o right foot x-ray -- IMPRESSION:Soft tissue ulceration of the heel. No underlying erosive bony abnormality. Diffuse degenerative change. No acute bony abnormality. 12/30/2015-- admitted to the hospital between July 12 and July 18 of 2017 and was treated for cerebral infarction, infected decubitus ulcer and aspiration pneumonia of the right lung. MRI was negative for stroke and was thought to be secondary to pneumonia. She was treated with clindamycin. Patient was discharged home on Keflex and Zyvox. Dr. Lucky Cowboy took her for an aortogram and a right lower extremity runoff and found to 30% stenosis in the mid to distal SFA but no significant stenosis in the popliteal artery and the SFA. There was a two-vessel runoff through the anterior tibial artery and peroneal artery without significant stenosis. felt her perfusion was adequate for wound healing. 02/28/2016 -- days ago she had a lacerated wound on her left forearm which they have been  treating locally and have asked me to take a look today. 03/13/16: returns today for f/u. the right posterior heel wound has achieved epithelialization. the left forearm wound is healing without issue. no systemic  s/s of infection. [name], [name] ([id number]) 03/20/16: returns today for f/u of a left forearm wound. husband reports dressing slides down. no reports of systemic s/s of infection. no new wounds or skin breakdown. 04/03/2016 -- she has had another laceration with a skin flap on the left upper arm and she also has one on the anterior part of the left forearm. 05/08/2016 -- this past Friday the dressing was soupy and moist so the husband changed it and he found that there was a lot of surrounding drainage and hence has been changing the dressing and asked to the seen today Electronic Signature(s) Signed: 05/08/2016 2:01:15 PM By: [name], FACS Entered By: [name] on 05/08/2016 14:01:15 [name] ([id number]) -------------------------------------------------------------------------------- Physical Exam Details Patient Name: [name], [name] 05/08/2016 1:30 Date of Service: PM Medical Record [id number] Number: Patient Account Number: [id number] Aug 30, 1935 (80 y.o. Treating RN: [name] Date of Birth/Sex: Female) Other Clinician: Primary Care Physician: Halina Maidens Treating [name] Referring Physician: Halina Maidens Physician/Extender: Suella Grove in Treatment: 22 Constitutional . Pulse regular. Respirations normal and unlabored. Afebrile. . Eyes Nonicteric. Reactive to light. Ears, Nose, Mouth, and Throat Lips, teeth, and gums WNL.Marland Kitchen Moist mucosa without lesions. Neck supple and nontender. No palpable supraclavicular or cervical adenopathy. Normal sized without goiter. Respiratory WNL. No retractions.. Breath sounds WNL, No rubs, rales, rhonchi, or wheeze.. Cardiovascular Heart rhythm and rate regular, no murmur or gallop.. Pedal  Pulses WNL. No clubbing, cyanosis or edema. Chest Breasts symmetical and no nipple discharge.. Breast tissue WNL, no masses, lumps, or tenderness.. Lymphatic No adneopathy. No adenopathy. No adenopathy. Musculoskeletal Adexa without tenderness or enlargement.. Digits and nails w/o clubbing, cyanosis, infection, petechiae, ischemia, or inflammatory conditions.. Integumentary (Hair, Skin) No suspicious lesions. No crepitus or fluctuance. No peri-wound warmth or erythema. No masses.Marland Kitchen Psychiatric Judgement and insight Intact.. No evidence of depression, anxiety, or agitation.. Notes the wound itself is about the same as last time but she has surrounding excoriation and looks like she has been scratching around this area and had the dressing left off during the weekend. Electronic Signature(s) Signed: 05/08/2016 2:01:50 PM By: [name], FACS Entered By: [name] on 05/08/2016 14:01:49 [name] ([id number]) -------------------------------------------------------------------------------- Physician Orders Details Patient Name: [name], [name] 05/08/2016 1:30 Date of Service: PM Medical Record [id number] Number: Patient Account Number: [id number] Jan 28, 1936 (80 y.o. Treating RN: [name] Date of Birth/Sex: Female) Other Clinician: Primary Care Physician: Halina Maidens Treating [name] Referring Physician: Halina Maidens Physician/Extender: Suella Grove in Treatment: 22 Verbal / Phone Orders: Yes Clinician: [name] Read Back and Verified: Yes Diagnosis Coding Wound Cleansing Wound #9 Left Upper Arm o Clean wound with Normal Saline. Primary Wound Dressing Wound #9 Left Upper Arm o Other: - siltec foam Secondary Dressing Wound #9 Left Upper Arm o Conform/Kerlix - netting Dressing Change Frequency Wound #9 Left Upper Arm o Other: - twice weekly Follow-up Appointments Wound #9 Left Upper Arm o Return Appointment in 1  week. Electronic Signature(s) Signed: 05/08/2016 4:38:31 PM By: [name], FACS Signed: 05/08/2016 5:14:32 PM By: [name] Entered By: [name] on 05/08/2016 14:08:11 [name] ([id number]) -------------------------------------------------------------------------------- Problem List Details Patient Name: [name], [name] 05/08/2016 1:30 Date of Service: PM Medical Record [id number] Number: Patient Account Number: [id number] 12/06/1935 (80 y.o. Treating RN: [name] Date of Birth/Sex: Female) Other Clinician: Primary Care Physician: Halina Maidens Treating [name] Referring Physician: Halina Maidens Physician/Extender: Suella Grove in Treatment: 22 Active Problems ICD-10 Encounter Code Description  Active Date Diagnosis E11.621 Type 2 diabetes mellitus with foot ulcer 12/02/2015 Yes Z79.01 Long term (current) use of anticoagulants 12/02/2015 Yes S41.112A Laceration without foreign body of left upper arm, initial 02/28/2016 Yes encounter S41.102A Unspecified open wound of left upper arm, initial 02/28/2016 Yes encounter Inactive Problems Resolved Problems ICD-10 Code Description Active Date Resolved Date L89.613 Pressure ulcer of right heel, stage 3 12/02/2015 12/02/2015 L97.212 Non-pressure chronic ulcer of right calf with fat layer 12/02/2015 12/02/2015 exposed L97.211 Non-pressure chronic ulcer of right calf limited to 12/02/2015 12/02/2015 breakdown of skin [name], [name] ([id number]) Electronic Signature(s) Signed: 05/08/2016 2:00:33 PM By: [name], FACS Entered By: [name] on 05/08/2016 14:00:33 [name] ([id number]) -------------------------------------------------------------------------------- Progress Note Details Patient Name: [name] 05/08/2016 1:30 Date of Service: PM Medical Record [id number] Number: Patient Account Number: [id number] 1935-12-14 (80 y.o. Treating RN: [name] Date of  Birth/Sex: Female) Other Clinician: Primary Care Physician: Halina Maidens Treating [name] Referring Physician: Halina Maidens Physician/Extender: Suella Grove in Treatment: 22 Subjective Chief Complaint Information obtained from Patient Patient is at the clinic for treatment of an open pressure ulcer to her right calcaneum which has been there for about 4 weeks History of Present Illness (HPI) The following HPI elements were documented for the patient's wound: Location: right posterior heel, and both lower extremities Quality: Patient reports experiencing a sharp pain to affected area(s). Severity: Patient states wound (s) are getting better. Duration: Patient has had the wound for < 4 weeks prior to presenting for treatment Timing: Pain in wound is Intermittent (comes and goes Context: The wound appeared gradually over time while she was in hospital with a right hip fracture Modifying Factors: Consults to this date include:was seen by her PCP and put on some antibiotics a while ago Associated Signs and Symptoms: Patient reports having difficulty standing for long periods. 80 year old patient referred who was recently discharged in April of this year with a left heel ulcer now comes with a right heel ulcer and some ulcerations in both lower extremities which have all come along during her recent hospitalization which she had a right hip fracture. She has a past medical history of COPD, diabetes mellitus type 2 with chronic kidney disease, multi-infarct dementia, anxiety, urinary retention, paroxysmal atrial fibrillation. She has also had moderate malnutrition, multi-infarct dementia, congestive heart failure, left displaced femoral neck fracture, systemic lupus erythematosus. She is also status total abdominal hysterectomy with bilateral salpingo-oophorectomy, EGDs, hip arthroplasty on the left, cataract surgery. She has been a from a smoker and quit in May 2013 and she smoked for  about 40 years. recently admitted to [facility] between January 19 and 07/03/2015 12/09/2015 -- right foot x-ray -- IMPRESSION:Soft tissue ulceration of the heel. No underlying erosive bony abnormality. Diffuse degenerative change. No acute bony abnormality. 12/30/2015-- admitted to the hospital between July 12 and July 18 of 2017 and was treated for cerebral infarction, infected decubitus ulcer and aspiration pneumonia of the right lung. MRI was negative for stroke and was thought to be secondary to pneumonia. She was treated with clindamycin. Patient was discharged home on Keflex and Zyvox. [name] ([id number]) Dr. Lucky Cowboy took her for an aortogram and a right lower extremity runoff and found to 30% stenosis in the mid to distal SFA but no significant stenosis in the popliteal artery and the SFA. There was a two-vessel runoff through the anterior tibial artery and peroneal artery without significant stenosis. felt her perfusion was adequate for wound  healing. 02/28/2016 -- days ago she had a lacerated wound on her left forearm which they have been treating locally and have asked me to take a look today. 03/13/16: returns today for f/u. the right posterior heel wound has achieved epithelialization. the left forearm wound is healing without issue. no systemic s/s of infection. 03/20/16: returns today for f/u of a left forearm wound. husband reports dressing slides down. no reports of systemic s/s of infection. no new wounds or skin breakdown. 04/03/2016 -- she has had another laceration with a skin flap on the left upper arm and she also has one on the anterior part of the left forearm. 05/08/2016 -- this past Friday the dressing was soupy and moist so the husband changed it and he found that there was a lot of surrounding drainage and hence has been changing the dressing and asked to the seen today Objective Constitutional Pulse regular. Respirations normal and  unlabored. Afebrile. Vitals Time Taken: 1:42 PM, Height: 64 in, Weight: 110 lbs, BMI: 18.9, Temperature: 97.6 F, Pulse: 59 bpm, Respiratory Rate: 16 breaths/min, Blood Pressure: 153/61 mmHg. Eyes Nonicteric. Reactive to light. Ears, Nose, Mouth, and Throat Lips, teeth, and gums WNL.Marland Kitchen Moist mucosa without lesions. Neck supple and nontender. No palpable supraclavicular or cervical adenopathy. Normal sized without goiter. Respiratory WNL. No retractions.. Breath sounds WNL, No rubs, rales, rhonchi, or wheeze.. Cardiovascular Heart rhythm and rate regular, no murmur or gallop.. Pedal Pulses WNL. No clubbing, cyanosis or edema. Chest Breasts symmetical and no nipple discharge.. Breast tissue WNL, no masses, lumps, or tenderness.Marland Kitchen [name], [name] ([id number]) Lymphatic No adneopathy. No adenopathy. No adenopathy. Musculoskeletal Adexa without tenderness or enlargement.. Digits and nails w/o clubbing, cyanosis, infection, petechiae, ischemia, or inflammatory conditions.Marland Kitchen Psychiatric Judgement and insight Intact.. No evidence of depression, anxiety, or agitation.. General Notes: the wound itself is about the same as last time but she has surrounding excoriation and looks like she has been scratching around this area and had the dressing left off during the weekend. Integumentary (Hair, Skin) No suspicious lesions. No crepitus or fluctuance. No peri-wound warmth or erythema. No masses.. Wound #9 status is Open. Original cause of wound was Trauma. The wound is located on the Left Upper Arm. The wound measures 0.7cm length x 2.3cm width x 0.1cm depth; 1.264cm^2 area and 0.126cm^3 volume. There is no tunneling or undermining noted. There is a medium amount of serosanguineous drainage noted. The wound margin is flat and intact. There is medium (34-66%) pink granulation within the wound bed. There is a medium (34-66%) amount of necrotic tissue within the wound bed including Eschar. Periwound  temperature was noted as No Abnormality. The periwound has tenderness on palpation. Assessment Active Problems ICD-10 E11.621 - Type 2 diabetes mellitus with foot ulcer Z79.01 - Long term (current) use of anticoagulants S41.112A - Laceration without foreign body of left upper arm, initial encounter S41.102A - Unspecified open wound of left upper arm, initial encounter Plan Wound Cleansing: Wound #9 Left Upper Arm: Clean wound with Normal Saline. Primary Wound Dressing: Wound #9 Left Upper Arm: Other: - siltec foam ALLICYN, [name] ([id number]) Secondary Dressing: Wound #9 Left Upper Arm: Conform/Kerlix - netting Dressing Change Frequency: Wound #9 Left Upper Arm: Other: - twice weekly Follow-up Appointments: Wound #9 Left Upper Arm: Return Appointment in 1 week. I will use a piece of Siltec Sorbact foam and cover it lightly with a clearing and a netting so as to keep in place. He will change it on Thursday and if  the wound looks inflamed he will call and come for review. If not we will change it on the next Monday Electronic Signature(s) Signed: 05/08/2016 4:09:53 PM By: [name], FACS Previous Signature: 05/08/2016 4:09:43 PM Version By: [name], FACS Previous Signature: 05/08/2016 2:03:04 PM Version By: [name], FACS Entered By: [name] on 05/08/2016 16:09:53 [name] ([id number]) -------------------------------------------------------------------------------- SuperBill Details Patient Name: [name] Date of Service: 05/08/2016 Medical Record Number: [id number] Patient Account Number: [id number] Date of Birth/Sex: 1935-10-23 (80 y.o. Female) Treating RN: [name] Primary Care Physician: Halina Maidens Other Clinician: Referring Physician: Halina Maidens Treating Physician/Extender: Frann Rider in Treatment: 22 Diagnosis Coding ICD-10 Codes Code Description E11.621 Type 2 diabetes mellitus with foot  ulcer Z79.01 Long term (current) use of anticoagulants S41.112A Laceration without foreign body of left upper arm, initial encounter S41.102A Unspecified open wound of left upper arm, initial encounter Facility Procedures CPT4 Code: [id number] Description: 99213 - WOUND CARE VISIT-LEV 3 EST PT Modifier: Quantity: 1 Physician Procedures CPT4 Code Description: [id number] - WC PHYS LEVEL 3 - EST PT ICD-10 Description Diagnosis E11.621 Type 2 diabetes mellitus with foot ulcer Z79.01 Long term (current) use of anticoagulants S41.112A Laceration without foreign body of left upper arm,  S41.102A Unspecified open wound of left upper arm, initial e Modifier: initial encount ncounter Quantity: 1 er Engineer, maintenance) Signed: 05/08/2016 2:31:13 PM By: [name] Signed: 05/08/2016 4:38:31 PM By: [name], FACS Previous Signature: 05/08/2016 2:03:18 PM Version By: [name], FACS Entered By: [name] on 05/08/2016 14:31:12

## 2016-05-09 NOTE — Progress Notes (Signed)
[name], [name] ([id number]) Visit Report for 05/08/2016 Arrival Information Details Patient Name: [name], [name]. Date of Service: 05/08/2016 1:30 PM Medical Record Number: [id number] Patient Account Number: [id number] Date of Birth/Sex: July 17, 1935 (80 y.o. Female) Treating RN: [name] Primary Care Physician: Halina Maidens Other Clinician: Referring Physician: Halina Maidens Treating Physician/Extender: Frann Rider in Treatment: 33 Visit Information History Since Last Visit Added or deleted any medications: No Patient Arrived: Ambulatory Any new allergies or adverse reactions: No Arrival Time: 13:41 Had a fall or experienced change in No Accompanied By: spouse and cg activities of daily living that may affect Transfer Assistance: Manual risk of falls: Patient Identification Verified: Yes Signs or symptoms of abuse/neglect since last No Secondary Verification Process Yes visito Completed: Hospitalized since last visit: No Patient Requires Transmission- No Pain Present Now: No Based Precautions: Patient Has Alerts: Yes Patient Alerts: Patient on Blood Thinner Electronic Signature(s) Signed: 05/08/2016 5:14:32 PM By: [name] Entered By: [name] on 05/08/2016 13:41:55 [name] ([id number]) -------------------------------------------------------------------------------- Clinic Level of Care Assessment Details Patient Name: [name] Date of Service: 05/08/2016 1:30 PM Medical Record Number: [id number] Patient Account Number: [id number] Date of Birth/Sex: 1935-11-03 (80 y.o. Female) Treating RN: [name] Primary Care Physician: Halina Maidens Other Clinician: Referring Physician: Halina Maidens Treating Physician/Extender: Frann Rider in Treatment: 22 Clinic Level of Care Assessment Items TOOL 4 Quantity Score []  - Use when only an EandM is performed on FOLLOW-UP visit 0 ASSESSMENTS - Nursing Assessment  / Reassessment X - Reassessment of Co-morbidities (includes updates in patient status) 1 10 X - Reassessment of Adherence to Treatment Plan 1 5 ASSESSMENTS - Wound and Skin Assessment / Reassessment X - Simple Wound Assessment / Reassessment - one wound 1 5 []  - Complex Wound Assessment / Reassessment - multiple wounds 0 []  - Dermatologic / Skin Assessment (not related to wound area) 0 ASSESSMENTS - Focused Assessment []  - Circumferential Edema Measurements - multi extremities 0 []  - Nutritional Assessment / Counseling / Intervention 0 []  - Lower Extremity Assessment (monofilament, tuning fork, pulses) 0 []  - Peripheral Arterial Disease Assessment (using hand held doppler) 0 ASSESSMENTS - Ostomy and/or Continence Assessment and Care []  - Incontinence Assessment and Management 0 []  - Ostomy Care Assessment and Management (repouching, etc.) 0 PROCESS - Coordination of Care X - Simple Patient / Family Education for ongoing care 1 15 []  - Complex (extensive) Patient / Family Education for ongoing care 0 []  - Staff obtains Programmer, systems, Records, Test Results / Process Orders 0 []  - Staff telephones HHA, Nursing Homes / Clarify orders / etc 0 []  - Routine Transfer to another Facility (non-emergent condition) 0 [name], [name] ([id number]) []  - Routine Hospital Admission (non-emergent condition) 0 []  - New Admissions / Biomedical engineer / Ordering NPWT, Apligraf, etc. 0 []  - Emergency Hospital Admission (emergent condition) 0 X - Simple Discharge Coordination 1 10 []  - Complex (extensive) Discharge Coordination 0 PROCESS - Special Needs []  - Pediatric / Minor Patient Management 0 []  - Isolation Patient Management 0 []  - Hearing / Language / Visual special needs 0 []  - Assessment of Community assistance (transportation, D/C planning, etc.) 0 []  - Additional assistance / Altered mentation 0 []  - Support Surface(s) Assessment (bed, cushion, seat, etc.) 0 INTERVENTIONS - Wound Cleansing /  Measurement X - Simple Wound Cleansing - one wound 1 5 []  - Complex Wound Cleansing - multiple wounds 0 X - Wound Imaging (photographs - any number of wounds) 1 5 []  -  Wound Tracing (instead of photographs) 0 X - Simple Wound Measurement - one wound 1 5 []  - Complex Wound Measurement - multiple wounds 0 INTERVENTIONS - Wound Dressings []  - Small Wound Dressing one or multiple wounds 0 X - Medium Wound Dressing one or multiple wounds 1 15 []  - Large Wound Dressing one or multiple wounds 0 []  - Application of Medications - topical 0 []  - Application of Medications - injection 0 INTERVENTIONS - Miscellaneous []  - External ear exam 0 [name], [name] ([id number]) []  - Specimen Collection (cultures, biopsies, blood, body fluids, etc.) 0 []  - Specimen(s) / Culture(s) sent or taken to Lab for analysis 0 []  - Patient Transfer (multiple staff / Harrel Lemon Lift / Similar devices) 0 []  - Simple Staple / Suture removal (25 or less) 0 []  - Complex Staple / Suture removal (26 or more) 0 []  - Hypo / Hyperglycemic Management (close monitor of Blood Glucose) 0 []  - Ankle / Brachial Index (ABI) - do not check if billed separately 0 X - Vital Signs 1 5 Has the patient been seen at the hospital within the last three years: Yes Total Score: 80 Level Of Care: New/Established - Level 3 Electronic Signature(s) Signed: 05/08/2016 5:14:32 PM By: [name] Entered By: [name] on 05/08/2016 14:31:02 [name] ([id number]) -------------------------------------------------------------------------------- Encounter Discharge Information Details Patient Name: [name] Date of Service: 05/08/2016 1:30 PM Medical Record Number: [id number] Patient Account Number: [id number] Date of Birth/Sex: 01-19-1936 (80 y.o. Female) Treating RN: [name] Primary Care Physician: Halina Maidens Other Clinician: Referring Physician: Halina Maidens Treating Physician/Extender: Frann Rider  in Treatment: 1 Encounter Discharge Information Items Discharge Pain Level: 0 Discharge Condition: Stable Ambulatory Status: Ambulatory Discharge Destination: Home Transportation: Private Auto Accompanied By: spouse Schedule Follow-up Appointment: Yes Medication Reconciliation completed and provided to Patient/Care No Roger Kettles: Provided on Clinical Summary of Care: 05/08/2016 Form Type Recipient Paper Patient LK Electronic Signature(s) Signed: 05/08/2016 2:32:26 PM By: [name] Previous Signature: 05/08/2016 2:09:49 PM Version By: Ruthine Dose Entered By: [name] on 05/08/2016 14:32:25 [name] ([id number]) -------------------------------------------------------------------------------- Multi Wound Chart Details Patient Name: [name] Date of Service: 05/08/2016 1:30 PM Medical Record Number: [id number] Patient Account Number: [id number] Date of Birth/Sex: 1935/07/31 (80 y.o. Female) Treating RN: [name] Primary Care Physician: Halina Maidens Other Clinician: Referring Physician: Halina Maidens Treating Physician/Extender: Frann Rider in Treatment: 22 Vital Signs Height(in): 64 Pulse(bpm): 59 Weight(lbs): 110 Blood Pressure 153/61 (mmHg): Body Mass Index(BMI): 19 Temperature(F): 97.6 Respiratory Rate 16 (breaths/min): Photos: [N/A:N/A] Wound Location: Left Upper Arm N/A N/A Wounding Event: Trauma N/A N/A Primary Etiology: Trauma, Other N/A N/A Comorbid History: Chronic Obstructive N/A N/A Pulmonary Disease (COPD), Angina, Congestive Heart Failure, Type II Diabetes, Lupus Erythematosus, Osteoarthritis, Dementia Date Acquired: 04/02/2016 N/A N/A Weeks of Treatment: 5 N/A N/A Wound Status: Open N/A N/A Measurements L x W x D 0.7x2.3x0.1 N/A N/A (cm) Area (cm) : 1.264 N/A N/A Volume (cm) : 0.126 N/A N/A % Reduction in Area: 79.90% N/A N/A % Reduction in Volume: 79.90% N/A N/A Classification: Partial  Thickness N/A N/A Exudate Amount: Medium N/A N/A Exudate Type: Serosanguineous N/A N/A Exudate Color: red, brown N/A N/A [name], [name] ([id number]) Wound Margin: Flat and Intact N/A N/A Granulation Amount: Medium (34-66%) N/A N/A Granulation Quality: Pink N/A N/A Necrotic Amount: Medium (34-66%) N/A N/A Necrotic Tissue: Eschar N/A N/A Epithelialization: Medium (34-66%) N/A N/A Periwound Skin Texture: No Abnormalities Noted N/A N/A Periwound Skin No Abnormalities Noted N/A  N/A Moisture: Periwound Skin Color: No Abnormalities Noted N/A N/A Temperature: No Abnormality N/A N/A Tenderness on Yes N/A N/A Palpation: Wound Preparation: Ulcer Cleansing: N/A N/A Rinsed/Irrigated with Saline Topical Anesthetic Applied: Other: lidocaine 4% Treatment Notes Electronic Signature(s) Signed: 05/08/2016 5:14:32 PM By: [name] Entered By: [name] on 05/08/2016 13:56:21 [name] ([id number]) -------------------------------------------------------------------------------- [place] Details Patient Name: [name] Date of Service: 05/08/2016 1:30 PM Medical Record Number: [id number] Patient Account Number: [id number] Date of Birth/Sex: Dec 02, 1935 (80 y.o. Female) Treating RN: [name] Primary Care Physician: Halina Maidens Other Clinician: Referring Physician: Halina Maidens Treating Physician/Extender: Frann Rider in Treatment: 22 Active Inactive Abuse / Safety / Falls / Self Care Management Nursing Diagnoses: Potential for falls Goals: Patient/caregiver will verbalize understanding of skin care regimen Date Initiated: 12/02/2015 Goal Status: Active Patient/caregiver will verbalize/demonstrate measures taken to prevent injury and/or falls Date Initiated: 12/02/2015 Goal Status: Active Interventions: Assess fall risk on admission and as needed Assess: immobility, friction, shearing, incontinence upon admission and as  needed Assess impairment of mobility on admission and as needed per policy Assess self care needs on admission and as needed Provide education on fall prevention Provide education on safe transfers Notes: Wound/Skin Impairment Nursing Diagnoses: Impaired tissue integrity Goals: Patient/caregiver will verbalize understanding of skin care regimen Date Initiated: 12/02/2015 Goal Status: Active Interventions: Assess patient/caregiver ability to obtain necessary supplies Assess patient/caregiver ability to perform ulcer/skin care regimen upon admission and as needed [name], [name] ([id number]) Assess ulceration(s) every visit Provide education on ulcer and skin care Notes: Electronic Signature(s) Signed: 05/08/2016 5:14:32 PM By: [name] Entered By: [name] on 05/08/2016 13:56:05 [name] ([id number]) -------------------------------------------------------------------------------- Pain Assessment Details Patient Name: [name] Date of Service: 05/08/2016 1:30 PM Medical Record Number: [id number] Patient Account Number: [id number] Date of Birth/Sex: [birth year] (80 y.o. Female) Treating RN: [name] Primary Care Physician: Halina Maidens Other Clinician: Referring Physician: Halina Maidens Treating Physician/Extender: Frann Rider in Treatment: 22 Active Problems Location of Pain Severity and Description of Pain Patient Has Paino No Site Locations Pain Management and Medication Current Pain Management: Notes Topical or injectable lidocaine is offered to patient for acute pain when surgical debridement is performed. If needed, Patient is instructed to use over the counter pain medication for the following 24-48 hours after debridement. Wound care MDs do not prescribed pain medications. Patient has chronic pain or uncontrolled pain. Patient has been instructed to make an appointment with their Primary Care Physician for pain  management. Electronic Signature(s) Signed: 05/08/2016 5:14:32 PM By: [name] Entered By: [name] on 05/08/2016 13:42:06 [name] ([id number]) -------------------------------------------------------------------------------- Patient/Caregiver Education Details Patient Name: [name] Date of Service: 05/08/2016 1:30 PM Medical Record Number: [id number] Patient Account Number: [id number] Date of Birth/Gender: 1935-07-06 (80 y.o. Female) Treating RN: [name] Primary Care Physician: Halina Maidens Other Clinician: Referring Physician: Halina Maidens Treating Physician/Extender: Frann Rider in Treatment: 22 Education Assessment Education Provided To: Caregiver Education Topics Provided Wound/Skin Impairment: Handouts: Other: wound care s ordered Methods: Explain/Verbal Responses: State content correctly Electronic Signature(s) Signed: 05/08/2016 5:14:32 PM By: [name] Entered By: [name] on 05/08/2016 14:32:56 [name] ([id number]) -------------------------------------------------------------------------------- Wound Assessment Details Patient Name: [name]. Date of Service: 05/08/2016 1:30 PM Medical Record Number: [id number] Patient Account Number: [id number] Date of Birth/Sex: [birth year] (80 y.o. Female) Treating RN: [name] Primary Care Physician: Halina Maidens Other Clinician: Referring Physician: Halina Maidens Treating Physician/Extender: Frann Rider in  Treatment: 22 Wound Status Wound Number: 9 Primary Trauma, Other Etiology: Wound Location: Left Upper Arm Wound Open Wounding Event: Trauma Status: Date Acquired: 04/02/2016 Comorbid Chronic Obstructive Pulmonary Disease Weeks Of Treatment: 5 History: (COPD), Angina, Congestive Heart Clustered Wound: No Failure, Type II Diabetes, Lupus Erythematosus, Osteoarthritis, Dementia Photos Wound Measurements Length: (cm)  0.7 % Reduction in Width: (cm) 2.3 % Reduction in Depth: (cm) 0.1 Epithelializati Area: (cm) 1.264 Tunneling: Volume: (cm) 0.126 Undermining: Area: 79.9% Volume: 79.9% on: Medium (34-66%) No No Wound Description Classification: Partial Thickness Wound Margin: Flat and Intact Exudate Amount: Medium Exudate Type: Serosanguineous Exudate Color: red, brown Foul Odor After Cleansing: No Wound Bed Granulation Amount: Medium (34-66%) Granulation Quality: Pink Necrotic Amount: Medium (34-66%) Necrotic Quality: [name], [name] ([id number]) Periwound Skin Texture Texture Color No Abnormalities Noted: No No Abnormalities Noted: No Moisture Temperature / Pain No Abnormalities Noted: No Temperature: No Abnormality Tenderness on Palpation: Yes Wound Preparation Ulcer Cleansing: Rinsed/Irrigated with Saline Topical Anesthetic Applied: Other: lidocaine 4%, Treatment Notes Wound #9 (Left Upper Arm) 1. Cleansed with: Clean wound with Normal Saline 2. Anesthetic Topical Lidocaine 4% cream to wound bed prior to debridement 4. Dressing Applied: Other dressing (specify in notes) 5. Secondary Dressing Applied Kerlix/Conform Non-Adherent pad Notes SIltec foam, netting Electronic Signature(s) Signed: 05/08/2016 5:14:32 PM By: [name] Entered By: [name] on 05/08/2016 13:55:50 [name] ([id number]) -------------------------------------------------------------------------------- [place] Details Patient Name: [name] Date of Service: 05/08/2016 1:30 PM Medical Record Number: [id number] Patient Account Number: [id number] Date of Birth/Sex: 1935/10/28 (80 y.o. Female) Treating RN: [name] Primary Care Physician: Halina Maidens Other Clinician: Referring Physician: Halina Maidens Treating Physician/Extender: Frann Rider in Treatment: 22 Vital Signs Time Taken: 13:42 Temperature (F): 97.6 Height (in): 64 Pulse (bpm):  59 Weight (lbs): 110 Respiratory Rate (breaths/min): 16 Body Mass Index (BMI): 18.9 Blood Pressure (mmHg): 153/61 Reference Range: 80 - 120 mg / dl Electronic Signature(s) Signed: 05/08/2016 5:14:32 PM By: [name] Entered By: [name] on 05/08/2016 13:44:16

## 2016-05-12 ENCOUNTER — Ambulatory Visit: Payer: Medicare Other | Admitting: Nurse Practitioner

## 2016-05-15 ENCOUNTER — Encounter: Payer: Medicare Other | Attending: Surgery | Admitting: Surgery

## 2016-05-15 DIAGNOSIS — I480 Paroxysmal atrial fibrillation: Secondary | ICD-10-CM | POA: Diagnosis not present

## 2016-05-15 DIAGNOSIS — Z87891 Personal history of nicotine dependence: Secondary | ICD-10-CM | POA: Insufficient documentation

## 2016-05-15 DIAGNOSIS — F0390 Unspecified dementia without behavioral disturbance: Secondary | ICD-10-CM | POA: Diagnosis not present

## 2016-05-15 DIAGNOSIS — J449 Chronic obstructive pulmonary disease, unspecified: Secondary | ICD-10-CM | POA: Insufficient documentation

## 2016-05-15 DIAGNOSIS — F419 Anxiety disorder, unspecified: Secondary | ICD-10-CM | POA: Insufficient documentation

## 2016-05-15 DIAGNOSIS — S41112A Laceration without foreign body of left upper arm, initial encounter: Secondary | ICD-10-CM | POA: Insufficient documentation

## 2016-05-15 DIAGNOSIS — E1122 Type 2 diabetes mellitus with diabetic chronic kidney disease: Secondary | ICD-10-CM | POA: Insufficient documentation

## 2016-05-15 DIAGNOSIS — Z7901 Long term (current) use of anticoagulants: Secondary | ICD-10-CM | POA: Insufficient documentation

## 2016-05-15 DIAGNOSIS — M329 Systemic lupus erythematosus, unspecified: Secondary | ICD-10-CM | POA: Insufficient documentation

## 2016-05-15 DIAGNOSIS — E11621 Type 2 diabetes mellitus with foot ulcer: Secondary | ICD-10-CM | POA: Insufficient documentation

## 2016-05-15 DIAGNOSIS — R339 Retention of urine, unspecified: Secondary | ICD-10-CM | POA: Diagnosis not present

## 2016-05-15 DIAGNOSIS — X58XXXA Exposure to other specified factors, initial encounter: Secondary | ICD-10-CM | POA: Diagnosis not present

## 2016-05-15 DIAGNOSIS — S41112D Laceration without foreign body of left upper arm, subsequent encounter: Secondary | ICD-10-CM | POA: Diagnosis not present

## 2016-05-15 DIAGNOSIS — N189 Chronic kidney disease, unspecified: Secondary | ICD-10-CM | POA: Diagnosis not present

## 2016-05-15 DIAGNOSIS — I509 Heart failure, unspecified: Secondary | ICD-10-CM | POA: Insufficient documentation

## 2016-05-16 NOTE — Progress Notes (Addendum)
Madison, [name] ([id number]) Visit Report for 05/15/2016 Arrival Information Details Patient Name: Madison, [name]. Date of Service: 05/15/2016 1:30 PM Medical Record Number: [id number] Patient Account Number: [id number] Date of Birth/Sex: 23-Mar-1936 (80 y.o. Female) Treating RN: Cornell Barman Primary Care Physician: Halina Maidens Other Clinician: Referring Physician: Halina Maidens Treating Physician/Extender: Frann Rider in Treatment: 23 Visit Information History Since Last Visit Added or deleted any medications: No Patient Arrived: Ambulatory Any new allergies or adverse reactions: No Arrival Time: 13:19 Had a fall or experienced change in No Accompanied By: husband activities of daily living that may affect Transfer Assistance: Manual risk of falls: Patient Identification Verified: Yes Signs or symptoms of abuse/neglect since last No Secondary Verification Process Yes visito Completed: Hospitalized since last visit: No Patient Requires Transmission- No Has Dressing in Place as Prescribed: Yes Based Precautions: Pain Present Now: No Patient Has Alerts: Yes Patient Alerts: Patient on Blood Thinner Electronic Signature(s) Signed: 05/15/2016 4:39:38 PM By: Gretta Cool, RN, BSN, Kim RN, BSN Previous Signature: 05/15/2016 4:29:11 PM Version By: Gretta Cool, RN, BSN, Kim RN, BSN Entered By: Gretta Cool, RN, BSN, Kim on 05/15/2016 16:37:13 [name] ([id number]) -------------------------------------------------------------------------------- Clinic Level of Care Assessment Details Patient Name: [name]. Date of Service: 05/15/2016 1:30 PM Medical Record Number: [id number] Patient Account Number: [id number] Date of Birth/Sex: [birth year] (80 y.o. Female) Treating RN: Cornell Barman Primary Care Physician: Halina Maidens Other Clinician: Referring Physician: Halina Maidens Treating Physician/Extender: Frann Rider in Treatment: 23 Clinic Level of Care Assessment  Items TOOL 4 Quantity Score []  - Use when only an EandM is performed on FOLLOW-UP visit 0 ASSESSMENTS - Nursing Assessment / Reassessment []  - Reassessment of Co-morbidities (includes updates in patient status) 0 X - Reassessment of Adherence to Treatment Plan 1 5 ASSESSMENTS - Wound and Skin Assessment / Reassessment X - Simple Wound Assessment / Reassessment - one wound 1 5 []  - Complex Wound Assessment / Reassessment - multiple wounds 0 []  - Dermatologic / Skin Assessment (not related to wound area) 0 ASSESSMENTS - Focused Assessment []  - Circumferential Edema Measurements - multi extremities 0 []  - Nutritional Assessment / Counseling / Intervention 0 []  - Lower Extremity Assessment (monofilament, tuning fork, pulses) 0 []  - Peripheral Arterial Disease Assessment (using hand held doppler) 0 ASSESSMENTS - Ostomy and/or Continence Assessment and Care []  - Incontinence Assessment and Management 0 []  - Ostomy Care Assessment and Management (repouching, etc.) 0 PROCESS - Coordination of Care X - Simple Patient / Family Education for ongoing care 1 15 []  - Complex (extensive) Patient / Family Education for ongoing care 0 []  - Staff obtains Programmer, systems, Records, Test Results / Process Orders 0 []  - Staff telephones HHA, Nursing Homes / Clarify orders / etc 0 []  - Routine Transfer to another Facility (non-emergent condition) 0 Madison, [name] ([id number]) []  - Routine Hospital Admission (non-emergent condition) 0 []  - New Admissions / Biomedical engineer / Ordering NPWT, Apligraf, etc. 0 []  - Emergency Hospital Admission (emergent condition) 0 X - Simple Discharge Coordination 1 10 []  - Complex (extensive) Discharge Coordination 0 PROCESS - Special Needs []  - Pediatric / Minor Patient Management 0 []  - Isolation Patient Management 0 []  - Hearing / Language / Visual special needs 0 []  - Assessment of Community assistance (transportation, D/C planning, etc.) 0 []  - Additional  assistance / Altered mentation 0 []  - Support Surface(s) Assessment (bed, cushion, seat, etc.) 0 INTERVENTIONS - Wound Cleansing / Measurement X - Simple Wound Cleansing - one wound 1  5 []  - Complex Wound Cleansing - multiple wounds 0 X - Wound Imaging (photographs - any number of wounds) 1 5 []  - Wound Tracing (instead of photographs) 0 X - Simple Wound Measurement - one wound 1 5 []  - Complex Wound Measurement - multiple wounds 0 INTERVENTIONS - Wound Dressings X - Small Wound Dressing one or multiple wounds 1 10 []  - Medium Wound Dressing one or multiple wounds 0 []  - Large Wound Dressing one or multiple wounds 0 []  - Application of Medications - topical 0 []  - Application of Medications - injection 0 INTERVENTIONS - Miscellaneous []  - External ear exam 0 Madison, [name] ([id number]) []  - Specimen Collection (cultures, biopsies, blood, body fluids, etc.) 0 []  - Specimen(s) / Culture(s) sent or taken to Lab for analysis 0 []  - Patient Transfer (multiple staff / Harrel Lemon Lift / Similar devices) 0 []  - Simple Staple / Suture removal (25 or less) 0 []  - Complex Staple / Suture removal (26 or more) 0 []  - Hypo / Hyperglycemic Management (close monitor of Blood Glucose) 0 []  - Ankle / Brachial Index (ABI) - do not check if billed separately 0 X - Vital Signs 1 5 Has the patient been seen at the hospital within the last three years: Yes Total Score: 65 Level Of Care: New/Established - Level 2 Electronic Signature(s) Signed: 05/15/2016 4:39:38 PM By: Gretta Cool, RN, BSN, Kim RN, BSN Previous Signature: 05/15/2016 4:29:11 PM Version By: Gretta Cool, RN, BSN, Kim RN, BSN Entered By: Gretta Cool, RN, BSN, Kim on 05/15/2016 16:38:25 [name] ([id number]) -------------------------------------------------------------------------------- Encounter Discharge Information Details Patient Name: [name]. Date of Service: 05/15/2016 1:30 PM Medical Record Number: [id number] Patient Account Number:  [id number] Date of Birth/Sex: 19-Apr-1936 (80 y.o. Female) Treating RN: Cornell Barman Primary Care Physician: Halina Maidens Other Clinician: Referring Physician: Halina Maidens Treating Physician/Extender: Frann Rider in Treatment: 92 Encounter Discharge Information Items Discharge Pain Level: 0 Discharge Condition: Stable Ambulatory Status: Ambulatory Discharge Destination: Home Transportation: Private Auto Accompanied By: husband Schedule Follow-up Appointment: No Medication Reconciliation completed and provided to Patient/Care Yes Kadarious Dikes: Provided on Clinical Summary of Care: 05/15/2016 Form Type Recipient Paper Patient LK Electronic Signature(s) Signed: 05/15/2016 4:39:38 PM By: Gretta Cool RN, BSN, Kim RN, BSN Previous Signature: 05/15/2016 4:29:11 PM Version By: Gretta Cool RN, BSN, Kim RN, BSN Previous Signature: 05/15/2016 1:36:12 PM Version By: Ruthine Dose Entered By: Gretta Cool RN, BSN, Kim on 05/15/2016 16:39:02 [name] ([id number]) -------------------------------------------------------------------------------- Multi Wound Chart Details Patient Name: [name]. Date of Service: 05/15/2016 1:30 PM Medical Record Number: [id number] Patient Account Number: [id number] Date of Birth/Sex: 1935-09-03 (80 y.o. Female) Treating RN: Cornell Barman Primary Care Physician: Halina Maidens Other Clinician: Referring Physician: Halina Maidens Treating Physician/Extender: Frann Rider in Treatment: 23 Vital Signs Height(in): 64 Pulse(bpm): 62 Weight(lbs): 110 Blood Pressure 127/55 (mmHg): Body Mass Index(BMI): 19 Temperature(F): 97.8 Respiratory Rate 16 (breaths/min): Photos: [N/A:N/A] Wound Location: Left Upper Arm N/A N/A Wounding Event: Trauma N/A N/A Primary Etiology: Trauma, Other N/A N/A Comorbid History: Chronic Obstructive N/A N/A Pulmonary Disease (COPD), Angina, Congestive Heart Failure, Type II Diabetes,  Lupus Erythematosus, Osteoarthritis, Dementia Date Acquired: 04/02/2016 N/A N/A Weeks of Treatment: 6 N/A N/A Wound Status: Open N/A N/A Measurements L x W x D 0x0x0 N/A N/A (cm) Area (cm) : 0 N/A N/A Volume (cm) : 0 N/A N/A % Reduction in Area: 100.00% N/A N/A % Reduction in Volume: 100.00% N/A N/A Classification: Partial Thickness N/A N/A Exudate Amount: None Present N/A N/A  Wound Margin: Flat and Intact N/A N/A Granulation Amount: Large (67-100%) N/A N/A Granulation Quality: Pink N/A N/A Madison, [name] ([id number]) Necrotic Amount: None Present (0%) N/A N/A Exposed Structures: Fascia: No N/A N/A Fat: No Tendon: No Muscle: No Joint: No Bone: No Limited to Skin Breakdown Epithelialization: Large (67-100%) N/A N/A Periwound Skin Texture: No Abnormalities Noted N/A N/A Periwound Skin Dry/Scaly: Yes N/A N/A Moisture: Periwound Skin Color: No Abnormalities Noted N/A N/A Temperature: No Abnormality N/A N/A Tenderness on Yes N/A N/A Palpation: Wound Preparation: Ulcer Cleansing: N/A N/A Rinsed/Irrigated with Saline Topical Anesthetic Applied: None, Other: lidocaine 4% Treatment Notes Wound #9 (Left Upper Arm) 1. Cleansed with: Clean wound with Normal Saline 4. Dressing Applied: Non-Adherent gauze 5. Secondary Dressing Applied Kerlix/Conform 7. Secured with Tape Notes Horticulturist, commercial) Signed: 05/15/2016 4:39:38 PM By: Gretta Cool, RN, BSN, Kim RN, BSN Previous Signature: 05/15/2016 4:29:11 PM Version By: Gretta Cool, RN, BSN, Kim RN, BSN Entered By: Gretta Cool, RN, BSN, Kim on 05/15/2016 16:37:58 [name] ([id number]) -------------------------------------------------------------------------------- [place] Details Patient Name: Madison, [name]. Date of Service: 05/15/2016 1:30 PM Medical Record Number: [id number] Patient Account Number: [id number] Date of Birth/Sex: [birth year] (80 y.o. Female) Treating RN: Cornell Barman Primary  Care Physician: Halina Maidens Other Clinician: Referring Physician: Halina Maidens Treating Physician/Extender: Frann Rider in Treatment: 15 Active Inactive Electronic Signature(s) Signed: 05/16/2016 11:44:23 AM By: Gretta Cool RN, BSN, Kim RN, BSN Previous Signature: 05/15/2016 4:39:38 PM Version By: Gretta Cool RN, BSN, Kim RN, BSN Previous Signature: 05/15/2016 4:29:11 PM Version By: Gretta Cool RN, BSN, Kim RN, BSN Entered By: Gretta Cool, RN, BSN, Kim on 05/16/2016 11:44:22 [name] ([id number]) -------------------------------------------------------------------------------- Pain Assessment Details Patient Name: Madison, [name]. Date of Service: 05/15/2016 1:30 PM Medical Record Number: [id number] Patient Account Number: [id number] Date of Birth/Sex: 11-28-1935 (80 y.o. Female) Treating RN: Cornell Barman Primary Care Physician: Halina Maidens Other Clinician: Referring Physician: Halina Maidens Treating Physician/Extender: Frann Rider in Treatment: 23 Active Problems Location of Pain Severity and Description of Pain Patient Has Paino No Site Locations With Dressing Change: No Pain Management and Medication Current Pain Management: Electronic Signature(s) Signed: 05/15/2016 4:39:38 PM By: Gretta Cool RN, BSN, Kim RN, BSN Previous Signature: 05/15/2016 4:29:11 PM Version By: Gretta Cool, RN, BSN, Kim RN, BSN Entered By: Gretta Cool, RN, BSN, Kim on 05/15/2016 16:37:20 [name] ([id number]) -------------------------------------------------------------------------------- Patient/Caregiver Education Details Patient Name: [name] Date of Service: 05/15/2016 1:30 PM Medical Record Number: [id number] Patient Account Number: [id number] Date of Birth/Gender: Sep 24, 1935 (80 y.o. Female) Treating RN: Cornell Barman Primary Care Physician: Halina Maidens Other Clinician: Referring Physician: Halina Maidens Treating Physician/Extender: Frann Rider in Treatment: 40 Education  Assessment Education Provided To: Patient Education Topics Provided Safety: Handouts: Safe Transfers Methods: Demonstration, Explain/Verbal Responses: State content correctly [employer]) Signed: 05/15/2016 4:39:38 PM By: Gretta Cool, RN, BSN, Kim RN, BSN Previous Signature: 05/15/2016 4:29:11 PM Version By: Gretta Cool, RN, BSN, Kim RN, BSN Entered By: Gretta Cool, RN, BSN, Kim on 05/15/2016 16:39:22 [name] ([id number]) -------------------------------------------------------------------------------- Wound Assessment Details Patient Name: Madison, [name]. Date of Service: 05/15/2016 1:30 PM Medical Record Number: [id number] Patient Account Number: [id number] Date of Birth/Sex: [birth year] (80 y.o. Female) Treating RN: Cornell Barman Primary Care Physician: Halina Maidens Other Clinician: Referring Physician: Halina Maidens Treating Physician/Extender: Lawanda Cousins Weeks in Treatment: 23 Wound Status Wound Number: 9 Primary Trauma, Other Etiology: Wound Location: Left Upper Arm Wound Open Wounding Event: Trauma Status: Date Acquired: 04/02/2016 Comorbid Chronic Obstructive Pulmonary Disease Weeks Of Treatment: 6  History: (COPD), Angina, Congestive Heart Clustered Wound: No Failure, Type II Diabetes, Lupus Erythematosus, Osteoarthritis, Dementia Photos Wound Measurements Length: (cm) 0 % Reduction in Width: (cm) 0 % Reduction in Depth: (cm) 0 Epithelializati Area: (cm) 0 Tunneling: Volume: (cm) 0 Undermining: Area: 100% Volume: 100% on: Large (67-100%) No No Wound Description Classification: Partial Thickness Wound Margin: Flat and Intact Exudate Amount: None Present Foul Odor After Cleansing: No Wound Bed Granulation Amount: Large (67-100%) Exposed Structure Granulation Quality: Pink Fascia Exposed: No Necrotic Amount: None Present (0%) Fat Layer Exposed: No Tendon Exposed: No Muscle Exposed: No Joint Exposed: No Bone Exposed: No Limited to Skin  Breakdown Madison, [name] ([id number]) Periwound Skin Texture Texture Color No Abnormalities Noted: No No Abnormalities Noted: No Moisture Temperature / Pain No Abnormalities Noted: No Temperature: No Abnormality Dry / Scaly: Yes Tenderness on Palpation: Yes Wound Preparation Ulcer Cleansing: Rinsed/Irrigated with Saline Topical Anesthetic Applied: None, Other: lidocaine 4%, Treatment Notes Wound #9 (Left Upper Arm) 1. Cleansed with: Clean wound with Normal Saline 4. Dressing Applied: Non-Adherent gauze 5. Secondary Dressing Applied Kerlix/Conform 7. Secured with Tape Notes Horticulturist, commercial) Signed: 05/15/2016 4:29:11 PM By: Gretta Cool, RN, BSN, Kim RN, BSN Entered By: Gretta Cool, RN, BSN, Kim on 05/15/2016 13:29:03 [name] ([id number]) -------------------------------------------------------------------------------- [place] Details Patient Name: [name] Date of Service: 05/15/2016 1:30 PM Medical Record Number: [id number] Patient Account Number: [id number] Date of Birth/Sex: 06/15/1935 (80 y.o. Female) Treating RN: Cornell Barman Primary Care Physician: Halina Maidens Other Clinician: Referring Physician: Halina Maidens Treating Physician/Extender: Frann Rider in Treatment: 23 Vital Signs Time Taken: 13:21 Temperature (F): 97.8 Height (in): 64 Pulse (bpm): 62 Weight (lbs): 110 Respiratory Rate (breaths/min): 16 Body Mass Index (BMI): 18.9 Blood Pressure (mmHg): 127/55 Reference Range: 80 - 120 mg / dl Electronic Signature(s) Signed: 05/15/2016 4:39:38 PM By: Gretta Cool, RN, BSN, Kim RN, BSN Previous Signature: 05/15/2016 4:29:11 PM Version By: Gretta Cool, RN, BSN, Kim RN, BSN Entered By: Gretta Cool, RN, BSN, Kim on 05/15/2016 16:37:27

## 2016-05-16 NOTE — Progress Notes (Signed)
DAVINITY, ROMINE ([id number]) Visit Report for 05/15/2016 Chief Complaint Document Details Patient Name: [name], [name] 05/15/2016 1:30 Date of Service: PM Medical Record [id number] Number: Patient Account Number: [id number] 03-Mar-1936 (80 y.o. Treating RN: Cornell Barman Date of Birth/Sex: Female) Other Clinician: Primary Care Physician: Halina Maidens Treating [name] Referring Physician: Halina Maidens Physician/Extender: Suella Grove in Treatment: 23 Information Obtained from: Patient Chief Complaint Patient is at the clinic for treatment of an open pressure ulcer to her right calcaneum which has been there for about 4 weeks Electronic Signature(s) Signed: 05/15/2016 4:43:48 PM By: Gretta Cool RN, BSN, Kim RN, BSN Signed: 05/15/2016 5:09:48 PM By: [name], FACS Previous Signature: 05/15/2016 1:32:57 PM Version By: [name], FACS Entered By: Gretta Cool RN, BSN, Kim on 05/15/2016 16:42:11 [name] ([id number]) -------------------------------------------------------------------------------- HPI Details Patient Name: Madison, [name] 05/15/2016 1:30 Date of Service: PM Medical Record [id number] Number: Patient Account Number: [id number] 07-17-1935 (80 y.o. Treating RN: Cornell Barman Date of Birth/Sex: Female) Other Clinician: Primary Care Physician: Halina Maidens Treating [name] Referring Physician: Halina Maidens Physician/Extender: Suella Grove in Treatment: 23 History of Present Illness Location: right posterior heel, and both lower extremities Quality: Patient reports experiencing a sharp pain to affected area(s). Severity: Patient states wound (s) are getting better. Duration: Patient has had the wound for < 4 weeks prior to presenting for treatment Timing: Pain in wound is Intermittent (comes and goes Context: The wound appeared gradually over time while she was in hospital with a right hip fracture Modifying Factors: Consults to this date include:was  seen by her PCP and put on some antibiotics a while ago Associated Signs and Symptoms: Patient reports having difficulty standing for long periods. HPI Description: 80 year old patient referred who was recently discharged in April of this year with a left heel ulcer now comes with a right heel ulcer and some ulcerations in both lower extremities which have all come along during her recent hospitalization which she had a right hip fracture. She has a past medical history of COPD, diabetes mellitus type 2 with chronic kidney disease, multi-infarct dementia, anxiety, urinary retention, paroxysmal atrial fibrillation. She has also had moderate malnutrition, multi-infarct dementia, congestive heart failure, left displaced femoral neck fracture, systemic lupus erythematosus. She is also status total abdominal hysterectomy with bilateral salpingo-oophorectomy, EGDs, hip arthroplasty on the left, cataract surgery. She has been a from a smoker and quit in May 2013 and she smoked for about 40 years. recently admitted to [facility] between January 19 and 07/03/2015 12/09/2015 -- o right foot x-ray -- IMPRESSION:Soft tissue ulceration of the heel. No underlying erosive bony abnormality. Diffuse degenerative change. No acute bony abnormality. 12/30/2015-- admitted to the hospital between July 12 and July 18 of 2017 and was treated for cerebral infarction, infected decubitus ulcer and aspiration pneumonia of the right lung. MRI was negative for stroke and was thought to be secondary to pneumonia. She was treated with clindamycin. Patient was discharged home on Keflex and Zyvox. Dr. Lucky Cowboy took her for an aortogram and a right lower extremity runoff and found to 30% stenosis in the mid to distal SFA but no significant stenosis in the popliteal artery and the SFA. There was a two-vessel runoff through the anterior tibial artery and peroneal artery without significant stenosis. felt her  perfusion was adequate for wound healing. 02/28/2016 -- days ago she had a lacerated wound on her left forearm which they have been treating locally and have asked me to take a look  today. 03/13/16: returns today for f/u. the right posterior heel wound has achieved epithelialization. the left forearm wound is healing without issue. no systemic s/s of infection. DIERDRE, CALTON ([id number]) 03/20/16: returns today for f/u of a left forearm wound. husband reports dressing slides down. no reports of systemic s/s of infection. no new wounds or skin breakdown. 04/03/2016 -- she has had another laceration with a skin flap on the left upper arm and she also has one on the anterior part of the left forearm. 05/08/2016 -- this past Friday the dressing was soupy and moist so the husband changed it and he found that there was a lot of surrounding drainage and hence has been changing the dressing and asked to the seen today Electronic Signature(s) Signed: 05/15/2016 4:43:48 PM By: Gretta Cool RN, BSN, Kim RN, BSN Signed: 05/15/2016 5:09:48 PM By: [name], FACS Previous Signature: 05/15/2016 1:33:04 PM Version By: [name], FACS Entered By: Gretta Cool RN, BSN, Kim on 05/15/2016 16:42:18 [name] ([id number]) -------------------------------------------------------------------------------- Physical Exam Details Patient Name: Madison, [name] 05/15/2016 1:30 Date of Service: PM Medical Record [id number] Number: Patient Account Number: [id number] 04-05-1936 (80 y.o. Treating RN: Cornell Barman Date of Birth/Sex: Female) Other Clinician: Primary Care Physician: Halina Maidens Treating [name] Referring Physician: Halina Maidens Physician/Extender: Suella Grove in Treatment: 23 Constitutional . Pulse regular. Respirations normal and unlabored. Afebrile. . Eyes Nonicteric. Reactive to light. Ears, Nose, Mouth, and Throat Lips, teeth, and gums WNL.Marland Kitchen Moist mucosa without  lesions. Neck supple and nontender. No palpable supraclavicular or cervical adenopathy. Normal sized without goiter. Respiratory WNL. No retractions.. Cardiovascular Pedal Pulses WNL. No clubbing, cyanosis or edema. Lymphatic No adneopathy. No adenopathy. No adenopathy. Musculoskeletal Adexa without tenderness or enlargement.. Digits and nails w/o clubbing, cyanosis, infection, petechiae, ischemia, or inflammatory conditions.. Integumentary (Hair, Skin) No suspicious lesions. No crepitus or fluctuance. No peri-wound warmth or erythema. No masses.Marland Kitchen Psychiatric Judgement and insight Intact.. No evidence of depression, anxiety, or agitation.. Notes the wound on the left upper arm is completely healed and there are no open ulcerations Electronic Signature(s) Signed: 05/15/2016 4:43:48 PM By: Gretta Cool RN, BSN, Kim RN, BSN Signed: 05/15/2016 5:09:48 PM By: [name], FACS Previous Signature: 05/15/2016 1:33:29 PM Version By: [name], FACS Entered By: Gretta Cool RN, BSN, Kim on 05/15/2016 16:42:27 [name] ([id number]) -------------------------------------------------------------------------------- Physician Orders Details Patient Name: BILLYJO, MCCUSKEY 05/15/2016 1:30 Date of Service: PM Medical Record [id number] Number: Patient Account Number: [id number] 1935/10/31 (80 y.o. Treating RN: Cornell Barman Date of Birth/Sex: Female) Other Clinician: Primary Care Physician: Halina Maidens Treating [name] Referring Physician: Halina Maidens Physician/Extender: Suella Grove in Treatment: 23 Verbal / Phone Orders: Yes Clinician: Cornell Barman Read Back and Verified: Yes Diagnosis Coding Wound Cleansing Wound #9 Left Upper Arm o Clean wound with Normal Saline. Primary Wound Dressing o Non-adherent pad Secondary Dressing o Conform/Kerlix - netting Dressing Change Frequency Wound #9 Left Upper Arm o Change dressing every week Discharge From [facility] Wound #9  Left Upper Arm o Discharge from [place] Signature(s) Signed: 05/15/2016 4:39:15 PM By: [name], FACS Signed: 05/15/2016 4:39:38 PM By: Gretta Cool RN, BSN, Kim RN, BSN Previous Signature: 05/15/2016 4:29:11 PM Version By: Gretta Cool, RN, BSN, Kim RN, BSN Entered By: Gretta Cool, RN, BSN, Kim on 05/15/2016 16:38:17 [name] ([id number]) -------------------------------------------------------------------------------- Problem List Details Patient Name: TAWANIA, DIMICHELE 05/15/2016 1:30 Date of Service: PM Medical Record [id number] Number: Patient Account Number: [id number] 28-Dec-1935 (80 y.o. Treating RN: Gretta Cool,  Maudie Mercury Date of Birth/Sex: Female) Other Clinician: Primary Care Physician: Halina Maidens Treating [name] Referring Physician: Halina Maidens Physician/Extender: Suella Grove in Treatment: 23 Active Problems ICD-10 Encounter Code Description Active Date Diagnosis E11.621 Type 2 diabetes mellitus with foot ulcer 12/02/2015 Yes Z79.01 Long term (current) use of anticoagulants 12/02/2015 Yes S41.112A Laceration without foreign body of left upper arm, initial 02/28/2016 Yes encounter S41.102A Unspecified open wound of left upper arm, initial 02/28/2016 Yes encounter Inactive Problems Resolved Problems ICD-10 Code Description Active Date Resolved Date L89.613 Pressure ulcer of right heel, stage 3 12/02/2015 12/02/2015 [id number] Non-pressure chronic ulcer of right calf with fat layer 12/02/2015 12/02/2015 exposed L97.211 Non-pressure chronic ulcer of right calf limited to 12/02/2015 12/02/2015 breakdown of skin EMMALYN, COWDIN ([id number]) Electronic Signature(s) Signed: 05/15/2016 4:39:15 PM By: [name], FACS Signed: 05/15/2016 4:39:38 PM By: Gretta Cool RN, BSN, Kim RN, BSN Previous Signature: 05/15/2016 1:32:49 PM Version By: [name], FACS Entered By: Gretta Cool RN, BSN, Kim on 05/15/2016 16:38:41 [name]  ([id number]) -------------------------------------------------------------------------------- Progress Note Details Patient Name: THAIS, FOOT 05/15/2016 1:30 Date of Service: PM Medical Record [id number] Number: Patient Account Number: [id number] 1935-08-18 (80 y.o. Treating RN: Cornell Barman Date of Birth/Sex: Female) Other Clinician: Primary Care Physician: Halina Maidens Treating [name] Referring Physician: Halina Maidens Physician/Extender: Suella Grove in Treatment: 23 Subjective Chief Complaint Information obtained from Patient Patient is at the clinic for treatment of an open pressure ulcer to her right calcaneum which has been there for about 4 weeks History of Present Illness (HPI) The following HPI elements were documented for the patient's wound: Location: right posterior heel, and both lower extremities Quality: Patient reports experiencing a sharp pain to affected area(s). Severity: Patient states wound (s) are getting better. Duration: Patient has had the wound for < 4 weeks prior to presenting for treatment Timing: Pain in wound is Intermittent (comes and goes Context: The wound appeared gradually over time while she was in hospital with a right hip fracture Modifying Factors: Consults to this date include:was seen by her PCP and put on some antibiotics a while ago Associated Signs and Symptoms: Patient reports having difficulty standing for long periods. 80 year old patient referred who was recently discharged in April of this year with a left heel ulcer now comes with a right heel ulcer and some ulcerations in both lower extremities which have all come along during her recent hospitalization which she had a right hip fracture. She has a past medical history of COPD, diabetes mellitus type 2 with chronic kidney disease, multi-infarct dementia, anxiety, urinary retention, paroxysmal atrial fibrillation. She has also had moderate malnutrition, multi-infarct  dementia, congestive heart failure, left displaced femoral neck fracture, systemic lupus erythematosus. She is also status total abdominal hysterectomy with bilateral salpingo-oophorectomy, EGDs, hip arthroplasty on the left, cataract surgery. She has been a from a smoker and quit in May 2013 and she smoked for about 40 years. recently admitted to [facility] between January 19 and 07/03/2015 12/09/2015 -- right foot x-ray -- IMPRESSION:Soft tissue ulceration of the heel. No underlying erosive bony abnormality. Diffuse degenerative change. No acute bony abnormality. 12/30/2015-- admitted to the hospital between July 12 and July 18 of 2017 and was treated for cerebral infarction, infected decubitus ulcer and aspiration pneumonia of the right lung. MRI was negative for stroke and was thought to be secondary to pneumonia. She was treated with clindamycin. Patient was discharged home on Keflex and Zyvox. ALIYANA, CHIRDON ([id number]) Dr. Lucky Cowboy took her  for an aortogram and a right lower extremity runoff and found to 30% stenosis in the mid to distal SFA but no significant stenosis in the popliteal artery and the SFA. There was a two-vessel runoff through the anterior tibial artery and peroneal artery without significant stenosis. felt her perfusion was adequate for wound healing. 02/28/2016 -- days ago she had a lacerated wound on her left forearm which they have been treating locally and have asked me to take a look today. 03/13/16: returns today for f/u. the right posterior heel wound has achieved epithelialization. the left forearm wound is healing without issue. no systemic s/s of infection. 03/20/16: returns today for f/u of a left forearm wound. husband reports dressing slides down. no reports of systemic s/s of infection. no new wounds or skin breakdown. 04/03/2016 -- she has had another laceration with a skin flap on the left upper arm and she also has one on the anterior  part of the left forearm. 05/08/2016 -- this past Friday the dressing was soupy and moist so the husband changed it and he found that there was a lot of surrounding drainage and hence has been changing the dressing and asked to the seen today Objective Constitutional Pulse regular. Respirations normal and unlabored. Afebrile. Vitals Time Taken: 1:21 PM, Height: 64 in, Weight: 110 lbs, BMI: 18.9, Temperature: 97.8 F, Pulse: 62 bpm, Respiratory Rate: 16 breaths/min, Blood Pressure: 127/55 mmHg. Eyes Nonicteric. Reactive to light. Ears, Nose, Mouth, and Throat Lips, teeth, and gums WNL.Marland Kitchen Moist mucosa without lesions. Neck supple and nontender. No palpable supraclavicular or cervical adenopathy. Normal sized without goiter. Respiratory WNL. No retractions.. Cardiovascular Pedal Pulses WNL. No clubbing, cyanosis or edema. Lymphatic No adneopathy. No adenopathy. No adenopathy. CLAIRE, WINEBRENNER ([id number]) Musculoskeletal Adexa without tenderness or enlargement.. Digits and nails w/o clubbing, cyanosis, infection, petechiae, ischemia, or inflammatory conditions.Marland Kitchen Psychiatric Judgement and insight Intact.. No evidence of depression, anxiety, or agitation.. General Notes: the wound on the left upper arm is completely healed and there are no open ulcerations Integumentary (Hair, Skin) No suspicious lesions. No crepitus or fluctuance. No peri-wound warmth or erythema. No masses.. Wound #9 status is Open. Original cause of wound was Trauma. The wound is located on the Left Upper Arm. The wound measures 0cm length x 0cm width x 0cm depth; 0cm^2 area and 0cm^3 volume. The wound is limited to skin breakdown. There is no tunneling or undermining noted. There is a none present amount of drainage noted. The wound margin is flat and intact. There is large (67-100%) pink granulation within the wound bed. There is no necrotic tissue within the wound bed. The periwound skin appearance exhibited:  Dry/Scaly. Periwound temperature was noted as No Abnormality. The periwound has tenderness on palpation. Assessment Active Problems ICD-10 E11.621 - Type 2 diabetes mellitus with foot ulcer Z79.01 - Long term (current) use of anticoagulants S41.112A - Laceration without foreign body of left upper arm, initial encounter S41.102A - Unspecified open wound of left upper arm, initial encounter Plan Wound Cleansing: Wound #9 Left Upper Arm: Clean wound with Normal Saline. Primary Wound Dressing: Non-adherent pad Secondary Dressing: Conform/Kerlix - netting Dressing Change Frequency: Wound #9 Left Upper Arm: AYLIA, SKIBA ([id number]) Change dressing every week Discharge From [facility]: Wound #9 Left Upper Arm: Discharge from [place] I have recommended a protective foam dressing to be held in place with a netting and he will continue to do this for a few weeks The wounds have healed and I have  discharged her from the wound care services and will see her back only if needed Electronic Signature(s) Signed: 05/15/2016 4:42:52 PM By: Gretta Cool RN, BSN, Kim RN, BSN Signed: 05/15/2016 5:09:48 PM By: [name], FACS Previous Signature: 05/15/2016 1:34:20 PM Version By: [name], FACS Entered By: Gretta Cool RN, BSN, Kim on 05/15/2016 16:42:52 [name] ([id number]) -------------------------------------------------------------------------------- [place] Details Patient Name: [name]. Date of Service: 05/15/2016 Medical Record Number: [id number] Patient Account Number: [id number] Date of Birth/Sex: June 19, 1935 (80 y.o. Female) Treating RN: Cornell Barman Primary Care Physician: Halina Maidens Other Clinician: Referring Physician: Halina Maidens Treating Physician/Extender: Frann Rider in Treatment: 23 Diagnosis Coding ICD-10 Codes Code Description E11.621 Type 2 diabetes mellitus with foot ulcer Z79.01 Long term (current) use of  anticoagulants S41.112A Laceration without foreign body of left upper arm, initial encounter S41.102A Unspecified open wound of left upper arm, initial encounter Physician Procedures CPT4 Code Description: [id number] - WC PHYS LEVEL 2 - EST PT ICD-10 Description Diagnosis E11.621 Type 2 diabetes mellitus with foot ulcer S41.112A Laceration without foreign body of left upper arm, Modifier: initial encoun Quantity: 1 ter Engineer, maintenance) Signed: 05/15/2016 4:43:48 PM By: Gretta Cool, RN, BSN, Kim RN, BSN Signed: 05/15/2016 5:09:48 PM By: [name], FACS Previous Signature: 05/15/2016 4:39:15 PM Version By: [name], FACS Previous Signature: 05/15/2016 4:39:38 PM Version By: Gretta Cool RN, BSN, Kim RN, BSN Previous Signature: 05/15/2016 1:34:50 PM Version By: [name], FACS Entered By: Gretta Cool, RN, BSN, Kim on 05/15/2016 16:43:01

## 2016-05-18 ENCOUNTER — Encounter: Payer: Self-pay | Admitting: Urology

## 2016-05-18 ENCOUNTER — Ambulatory Visit (INDEPENDENT_AMBULATORY_CARE_PROVIDER_SITE_OTHER): Payer: Medicare Other | Admitting: Urology

## 2016-05-18 VITALS — BP 159/83 | HR 67 | Ht 64.0 in | Wt 113.2 lb

## 2016-05-18 DIAGNOSIS — R339 Retention of urine, unspecified: Secondary | ICD-10-CM

## 2016-05-18 DIAGNOSIS — Z87898 Personal history of other specified conditions: Secondary | ICD-10-CM

## 2016-05-18 DIAGNOSIS — R310 Gross hematuria: Secondary | ICD-10-CM

## 2016-05-18 DIAGNOSIS — I639 Cerebral infarction, unspecified: Secondary | ICD-10-CM | POA: Diagnosis not present

## 2016-05-18 DIAGNOSIS — N200 Calculus of kidney: Secondary | ICD-10-CM

## 2016-05-18 LAB — BLADDER SCAN AMB NON-IMAGING: Scan Result: 113

## 2016-05-18 NOTE — Progress Notes (Signed)
05/18/2016 3:15 PM   [name] 12-09-1935 [id number]  Referring provider: Glean Hess, MD [address] [place], [place] [address]  Chief Complaint  Patient presents with  . Urinary Retention    3 month follow up    HPI: Patient is a 80 year old Caucasian female with dementia with a history of microscopic hematuria, a history of urinary retention and a history of nephrolithiasis who presents today for a three month follow-up after implementing behavioral therapies for her urinary issues.    Hematuria Patient underwent a hematuria workup included a CT urogram and cystoscopy which was negative for GU pathology 2016.  She has had another episode of hematuria in August 2017. CT renal stone study noted punctate nonobstructing stones in the upper right kidney. She was found to have a positive urine culture for Escherichia coli. She was placed on antibiotics. She has not had any further hematuria.  Nephrolithiasis Patient underwent a CT renal stone study on 01/15/2016 which noted punctate nonobstructing stones in the upper right kidney.  Patient had not had flank pain or gross hematuria. She is also not passed any stone fragments.  History of urinary retention Patient experienced urinary retention after suffering a fall 2016. She has subsequently passed a voiding trial and is voiding on her own. Her PVR today is 113 mL.  She complains of urinary frequency, nocturia and intermittency.  She suffers from severe dementia.  She does not complain of dysuria or suprapubic pain.  She is not having recent fevers, chills, nausea or vomiting. She is not using the vaginal estrogen cream.  She is bothered a little bit with the sudden urge to urinate.     PMH: Past Medical History:  Diagnosis Date  . Ankylosing spondylitis ([place])    ? how diagnosed  . Anxiety   . Asthma    as child  . Chronic diastolic CHF (congestive heart failure) ([place])    a. 03/2015 Echo: EF 55-60%, Gr  1 DD.  Marland Kitchen Chronic kidney infection   . Chronic peptic ulcer, unspecified site, without mention of hemorrhage, perforation, or obstruction   . COPD (chronic obstructive pulmonary disease) ([place])   . COPD (chronic obstructive pulmonary disease) ([place])    a.Uses 2l prn - followed by Dr. Raul Del.  . Diabetes mellitus ([place])   . DM (diabetes mellitus) ([place])   . GERD (gastroesophageal reflux disease)   . Headache   . History of kidney stones   . Hypothyroidism    mass, U/S 6/21 - nodules  . IBS (irritable bowel syndrome)   . Insomnia, unspecified   . Left displaced femoral neck fracture ([place])    a. 03/2015 s/p L hemiarthroplasty.  . Lumbar spondylolysis   . Mitral valve prolapse    a. Not noted on 03/2015 Echo.  . Motion sickness    car - back seat  . Neuropathy ([place])   . Osteoarthritis   . PAF (paroxysmal atrial fibrillation) ([place])    a. 03/2015 in setting of hip Fx->Amio/Xarelo;  b. CHA2DS2VASc= 4.  . Palpitations   . Raynaud disease   . Raynaud's disease   . Rheumatic fever   . Systemic lupus erythematosus ([place])   . Uveitis, anterior    ????  . Wears contact lenses   . Wears dentures    full upper, partial lower  . Wheezing     Surgical History: Past Surgical History:  Procedure Laterality Date  . BIOPSY THYROID    . BREAST BIOPSY    .  CATARACT EXTRACTION W/PHACO Left 03/23/2015   Procedure: CATARACT EXTRACTION PHACO AND INTRAOCULAR LENS PLACEMENT (IOC);  Surgeon: Birder Robson, MD;  Location: [facility];  Service: Ophthalmology;  Laterality: Left;  [place] 00:51   . CATARACT EXTRACTION W/PHACO Right 05/25/2015   Procedure: CATARACT EXTRACTION PHACO AND INTRAOCULAR LENS PLACEMENT (IOC);  Surgeon: Birder Robson, MD;  Location: [facility];  Service: Ophthalmology;  Laterality: Right;  [place] 00:57   . COLONOSCOPY  multiple  . ESOPHAGOGASTRODUODENOSCOPY  multiple  . ESOPHAGOGASTRODUODENOSCOPY (EGD) WITH PROPOFOL N/A 12/11/2014   Procedure: ESOPHAGOGASTRODUODENOSCOPY (EGD) WITH  PROPOFOL;  Surgeon: [name], MD;  Location: [facility];  Service: Endoscopy;  Laterality: N/A;  cytology brushing  . HIP ARTHROPLASTY Left 03/26/2015   Procedure: ARTHROPLASTY BIPOLAR HIP (HEMIARTHROPLASTY);  Surgeon: Earnestine Leys, MD;  Location: [facility];  Service: Orthopedics;  Laterality: Left;  . HIP ARTHROPLASTY Right 11/06/2015   Procedure: ARTHROPLASTY BIPOLAR HIP (HEMIARTHROPLASTY);  Surgeon: Thornton Park, MD;  Location: [facility];  Service: Orthopedics;  Laterality: Right;  . JOINT REPLACEMENT Left   . PERIPHERAL VASCULAR CATHETERIZATION N/A 12/27/2015   Procedure: Lower Extremity Angiography;  Surgeon: Algernon Huxley, MD;  Location: [facility];  Service: Cardiovascular;  Laterality: N/A;  . PERIPHERAL VASCULAR CATHETERIZATION  12/27/2015   Procedure: Lower Extremity Intervention;  Surgeon: Algernon Huxley, MD;  Location: [facility];  Service: Cardiovascular;;  . TOTAL ABDOMINAL HYSTERECTOMY W/ BILATERAL SALPINGOOPHORECTOMY  1990    Home Medications:    Medication List       Accurate as of 05/18/16  3:15 PM. Always use your most recent med list.          albuterol (2.5 MG/3ML) 0.083% nebulizer solution Commonly known as:  PROVENTIL Take 2.5 mg by nebulization every 6 (six) hours as needed for wheezing or shortness of breath. Reported on 07/09/2015   ALIGN 4 MG Caps Take 1 capsule (4 mg total) by mouth daily.   amiodarone 200 MG tablet Commonly known as:  PACERONE Take 1 tablet (200 mg total) by mouth daily.   docusate sodium 100 MG capsule Commonly known as:  COLACE Take 1 capsule (100 mg total) by mouth 2 (two) times daily.   feeding supplement (ENSURE ENLIVE) Liqd Take 237 mLs by mouth 2 (two) times daily between meals.   ferrous sulfate 325 (65 FE) MG tablet Take 1 tablet (325 mg total) by mouth 2 (two) times daily with a meal.   glimepiride 2 MG tablet Commonly known as:  AMARYL Take by mouth.   glimepiride 2 MG tablet Commonly  known as:  AMARYL Take 1 tablet (2 mg total) by mouth daily before breakfast.   JANUVIA 100 MG tablet Generic drug:  sitaGLIPtin Take 0.5 tablets (50 mg total) by mouth daily.   LORazepam 1 MG tablet Commonly known as:  ATIVAN Take 1 tablet (1 mg total) by mouth at bedtime.   metoprolol tartrate 25 MG tablet Commonly known as:  LOPRESSOR Take 1 tablet (25 mg total) by mouth 2 (two) times daily.   mometasone-formoterol 100-5 MCG/ACT Aero Commonly known as:  DULERA Inhale 2 puffs into the lungs 2 (two) times daily.   oxyCODONE 5 MG immediate release tablet Commonly known as:  Oxy IR/ROXICODONE Take 1 tablet (5 mg total) by mouth every 4 (four) hours as needed for severe pain ((for MODERATE breakthrough pain)).   ranitidine 150 MG tablet Commonly known as:  ZANTAC Take 1 tablet (150 mg total) by mouth 2 (two) times daily.  senna 8.6 MG Tabs tablet Commonly known as:  SENOKOT Take 1 tablet by mouth 2 (two) times daily.   tiotropium 18 MCG inhalation capsule Commonly known as:  SPIRIVA Place 1 capsule (18 mcg total) into inhaler and inhale daily.       Allergies:  Allergies  Allergen Reactions  . Codeine Nausea And Vomiting  . Penicillins Hives    Has patient had a PCN reaction causing immediate rash, facial/tongue/throat swelling, SOB or lightheadedness with hypotension: no  Has patient had a PCN reaction causing severe rash involving mucus membranes or skin necrosis: no  Has patient had a PCN reaction that required hospitalization: no  Has patient had a PCN reaction occurring within the last 10 years: no  If all of the above answers are "NO", then may proceed with Cephalosporin use.   . Sulfa Antibiotics   . Xarelto [Rivaroxaban]   . Doxycycline Rash  . Erythromycin Rash  . Latex Rash    Family History: Family History  Problem Relation Age of Onset  . Heart disease Mother   . Hypothyroidism Mother   . Diabetes Mother   . Heart attack Mother   .  Hypertension Mother   . Heart disease Father   . Heart attack Father   . Breast cancer Sister   . Hypothyroidism Sister   . Diabetes Sister   . Ovarian cancer Sister   . Diabetes Brother   . Prostate cancer Brother   . Hypothyroidism Daughter   . Kidney Stones Brother   . Kidney disease Neg Hx   . Bladder Cancer Neg Hx     Social History:  reports that she quit smoking about 4 years ago. Her smoking use included Cigarettes. She has a 10.00 pack-year smoking history. She has never used smokeless tobacco. She reports that she does not drink alcohol or use drugs.  ROS: UROLOGY Frequent Urination?: No Hard to postpone urination?: No Burning/pain with urination?: No Get up at night to urinate?: No Leakage of urine?: No Urine stream starts and stops?: No Trouble starting stream?: No Do you have to strain to urinate?: No Blood in urine?: No Urinary tract infection?: No Sexually transmitted disease?: No Injury to kidneys or bladder?: No Painful intercourse?: No Weak stream?: No Currently pregnant?: No Vaginal bleeding?: No Last menstrual period?: n  Gastrointestinal Nausea?: No Vomiting?: No Indigestion/heartburn?: No Diarrhea?: No Constipation?: No  Constitutional Fever: No Night sweats?: No Weight loss?: No Fatigue?: No  Skin Skin rash/lesions?: No Itching?: No  Eyes Blurred vision?: No Double vision?: No  Ears/Nose/Throat Sore throat?: No Sinus problems?: No  Hematologic/Lymphatic Swollen glands?: No Easy bruising?: No  Cardiovascular Leg swelling?: No Chest pain?: No  Respiratory Cough?: No Shortness of breath?: No  Endocrine Excessive thirst?: No  Musculoskeletal Back pain?: No Joint pain?: No  Neurological Headaches?: No Dizziness?: No  Psychologic Depression?: No Anxiety?: No  Physical Exam: BP (!) 159/83   Pulse 67   Ht 5\' 4"  (1.626 m)   Wt 113 lb 3.2 oz (51.3 kg)   BMI 19.43 kg/m   Constitutional: Well nourished. Alert  and oriented, No acute distress. HEENT: [place] AT, moist mucus membranes. Trachea midline, no masses. Cardiovascular: No clubbing, cyanosis, or edema. Respiratory: Normal respiratory effort, no increased work of breathing. GI: Abdomen is soft, non tender, non distended, no abdominal masses. Liver and spleen not palpable.  No hernias appreciated.  Stool sample for occult testing is not indicated.   GU: No CVA tenderness.  No bladder fullness  or masses.   Skin: No rashes, bruises or suspicious lesions. Lymph: No cervical or inguinal adenopathy. Neurologic: Grossly intact, no focal deficits, moving all 4 extremities. Psychiatric: Normal mood and affect.  Laboratory Data: Lab Results  Component Value Date   WBC 7.0 01/15/2016   HGB 10.4 (L) 01/15/2016   HCT 30.9 (L) 01/15/2016   MCV 85.8 01/15/2016   PLT 227 01/15/2016    Lab Results  Component Value Date   CREATININE 0.66 01/15/2016    Lab Results  Component Value Date   HGBA1C 7.6 (H) 12/22/2015    Lab Results  Component Value Date   TSH 1.726 07/01/2015       Component Value Date/Time   CHOL 125 12/23/2015 0507   HDL 48 12/23/2015 0507   CHOLHDL 2.6 12/23/2015 0507   VLDL 12 12/23/2015 0507   LDLCALC 65 12/23/2015 0507    Lab Results  Component Value Date   AST 53 (H) 12/22/2015   Lab Results  Component Value Date   ALT 34 12/22/2015    Pertinent Imaging: Results for DARCEL, STOKLEY (MRN [id number]) as of 05/18/2016 15:14  Ref. Range 05/18/2016 15:00  Scan Result Unknown 113    Assessment & Plan:    1. History of urinary retention  - BLADDER SCAN AMB NON-IMAGING  - dicussed timed voiding with the patient and her caregiver  - patient will sit on the commode every 2 1/2 half hours  - RTC in 01/2017 PVR and OAB questionnaire  2. Gross Hematuria  - work up completed in 2016 was negative  - gross hematuria in 01/2016- punctate stones in upper right kidneys and positive urine culture for E. coli  -  will continue to monitor  - will report any further gross hematuria  3. Nephrolithiasis  - punctate stones in the upper right kidney  - continue to monitor with yearly KUB's -RTC in 01/2017  Return for RTC in 01/2017 for KUB and office visit.  These notes generated with voice recognition software. I apologize for typographical errors.  Zara Council, [facility] [address] [place], [place] [address] [phone]

## 2016-06-29 ENCOUNTER — Ambulatory Visit: Payer: Medicare Other | Admitting: Podiatry

## 2016-07-02 ENCOUNTER — Other Ambulatory Visit: Payer: Self-pay | Admitting: Internal Medicine

## 2016-07-03 ENCOUNTER — Other Ambulatory Visit: Payer: Self-pay | Admitting: Internal Medicine

## 2016-07-03 ENCOUNTER — Ambulatory Visit (INDEPENDENT_AMBULATORY_CARE_PROVIDER_SITE_OTHER): Payer: Medicare Other | Admitting: Internal Medicine

## 2016-07-03 ENCOUNTER — Encounter: Payer: Self-pay | Admitting: Internal Medicine

## 2016-07-03 VITALS — BP 122/68 | HR 88 | Temp 97.6°F | Ht 64.0 in | Wt 115.0 lb

## 2016-07-03 DIAGNOSIS — F0150 Vascular dementia without behavioral disturbance: Secondary | ICD-10-CM | POA: Diagnosis not present

## 2016-07-03 DIAGNOSIS — G309 Alzheimer's disease, unspecified: Secondary | ICD-10-CM | POA: Diagnosis not present

## 2016-07-03 DIAGNOSIS — R1011 Right upper quadrant pain: Secondary | ICD-10-CM

## 2016-07-03 DIAGNOSIS — N181 Chronic kidney disease, stage 1: Secondary | ICD-10-CM

## 2016-07-03 DIAGNOSIS — R339 Retention of urine, unspecified: Secondary | ICD-10-CM | POA: Diagnosis not present

## 2016-07-03 DIAGNOSIS — I480 Paroxysmal atrial fibrillation: Secondary | ICD-10-CM | POA: Diagnosis not present

## 2016-07-03 DIAGNOSIS — F0280 Dementia in other diseases classified elsewhere without behavioral disturbance: Secondary | ICD-10-CM

## 2016-07-03 DIAGNOSIS — E1122 Type 2 diabetes mellitus with diabetic chronic kidney disease: Secondary | ICD-10-CM

## 2016-07-03 DIAGNOSIS — K219 Gastro-esophageal reflux disease without esophagitis: Secondary | ICD-10-CM | POA: Diagnosis not present

## 2016-07-03 DIAGNOSIS — G4709 Other insomnia: Secondary | ICD-10-CM | POA: Diagnosis not present

## 2016-07-03 MED ORDER — LORAZEPAM 1 MG PO TABS: 0.5000 mg | ORAL_TABLET | Freq: Every evening | ORAL | 0 refills | Status: AC | PRN

## 2016-07-03 NOTE — Progress Notes (Signed)
Date:  07/03/2016   Name:  [name]   DOB:  [birth year]   MRN:  [id number]   Chief Complaint: Urinary Tract Infection Urinary Tract Infection   This is a recurrent problem. The problem has been gradually worsening. The quality of the pain is described as burning. The patient is experiencing no pain. There has been no fever. Associated symptoms include nausea. Pertinent negatives include no hematuria or vomiting.  Diabetes  She presents for her follow-up diabetic visit. She has type 2 diabetes mellitus. Her disease course has been improving. Hypoglycemia symptoms include confusion. Pertinent negatives for hypoglycemia include no dizziness or headaches. Pertinent negatives for diabetes include no chest pain. Her weight is stable. Her home blood glucose trend is decreasing steadily.  Abdominal Pain  This is a new problem. The current episode started in the past 7 days. The problem occurs intermittently. The pain is located in the RUQ. The quality of the pain is colicky and cramping. The abdominal pain does not radiate. Associated symptoms include arthralgias, dysuria and nausea. Pertinent negatives include no constipation, diarrhea, fever, headaches, hematuria or vomiting.  Insomnia  Primary symptoms: sleep disturbance, difficulty falling asleep, frequent awakening.  The problem has been gradually worsening since onset. The symptoms are aggravated by pain. Past treatments include medication.  Atrial fibrillation -  paroxysmal but mostly regular according to patient.  She continues on metoprolol but has stopped amiodarone and blood thinner due to bleeding.    Review of Systems  Constitutional: Negative for diaphoresis and fever.  Respiratory: Negative for chest tightness, shortness of breath and wheezing.   Cardiovascular: Negative for chest pain, palpitations and leg swelling.  Gastrointestinal: Positive for abdominal pain and nausea. Negative for constipation, diarrhea and  vomiting.  Genitourinary: Positive for dysuria. Negative for hematuria and pelvic pain.  Musculoskeletal: Positive for arthralgias and gait problem.  Skin: Negative for rash and wound.  Neurological: Negative for dizziness, syncope and headaches.  Psychiatric/Behavioral: Positive for confusion and sleep disturbance. Negative for dysphoric mood and hallucinations. The patient has insomnia.     Patient Active Problem List   Diagnosis Date Noted  . DNR (do not resuscitate) 12/29/2015  . Palliative care encounter 12/29/2015  . Dementia   . Demand ischemia ([place]) 12/23/2015  . Metabolic encephalopathy [id number]  . Sepsis ([place]) 12/23/2015  . Closed right hip fracture ([place]) 11/04/2015  . Chronic respiratory failure with hypoxia ([place]) 10/27/2015  . Mixed Alzheimer's and vascular dementia 07/29/2015  . Type 2 diabetes mellitus with stage 1 chronic kidney disease, without long-term current use of insulin ([place]) 07/16/2015  . Tremor observed on examination 07/16/2015  . Urinary retention 07/12/2015  . Stage III pressure ulcer of right heel ([place]) 07/12/2015  . Malnutrition of moderate degree 07/03/2015  . 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 Paroxysmal atrial fibrillation ([place]) 06/25/2015  . Chronic diastolic CHF (congestive heart failure) ([place])   . Left displaced femoral neck fracture ([place])   . Fall   . GERD (gastroesophageal reflux disease) 03/26/2015  . Anxiety 03/26/2015  . Gastroduodenal ulcer 01/05/2015  . Difficulty swallowing solids   . Esophageal candidiasis ([place])   . Acquired hypothyroidism 08/18/2014  . Systemic lupus erythematosus ([place]) 04/03/2014    Prior to Admission medications   Medication Sig Start Date End Date Taking? Authorizing Provider  albuterol (PROVENTIL) (2.5 MG/3ML) 0.083% nebulizer solution Take 2.5 mg by nebulization every 6 (six) hours as needed for wheezing or shortness of breath. Reported on 07/09/2015   Yes  Historical Provider, MD  amiodarone (PACERONE)  200 MG tablet Take 1 tablet (200 mg total) by mouth daily. 02/04/16  Yes Glean Hess, MD  feeding supplement, ENSURE ENLIVE, (ENSURE ENLIVE) LIQD Take 237 mLs by mouth 2 (two) times daily between meals. 11/10/15  Yes Srikar Sudini, MD  glimepiride (AMARYL) 2 MG tablet Take 1 tablet (2 mg total) by mouth daily before breakfast. 04/04/16  Yes Glean Hess, MD  JANUVIA 100 MG tablet Take 0.5 tablets (50 mg total) by mouth daily. Patient taking differently: Take 100 mg by mouth daily.  02/04/16  Yes Glean Hess, MD  metoprolol tartrate (LOPRESSOR) 25 MG tablet Take 1 tablet (25 mg total) by mouth 2 (two) times daily. 02/15/16  Yes Glean Hess, MD  mometasone-formoterol [facility]) 100-5 MCG/ACT AERO Inhale 2 puffs into the lungs 2 (two) times daily. 07/16/15  Yes Glean Hess, MD  Probiotic Product (ALIGN) 4 MG CAPS Take 1 capsule (4 mg total) by mouth daily. 06/22/15  Yes Glean Hess, MD  ranitidine (ZANTAC) 150 MG tablet Take 1 tablet (150 mg total) by mouth 2 (two) times daily. 02/03/16  Yes Glean Hess, MD  senna (SENOKOT) 8.6 MG TABS tablet Take 1 tablet by mouth 2 (two) times daily.   Yes Historical Provider, MD  tiotropium (SPIRIVA) 18 MCG inhalation capsule Place 1 capsule (18 mcg total) into inhaler and inhale daily. 02/22/16  Yes Glean Hess, MD  docusate sodium (COLACE) 100 MG capsule Take 1 capsule (100 mg total) by mouth 2 (two) times daily. Patient not taking: Reported on 05/18/2016 11/10/15   Hillary Bow, MD  ferrous sulfate 325 (65 FE) MG tablet Take 1 tablet (325 mg total) by mouth 2 (two) times daily with a meal. Patient not taking: Reported on 05/18/2016 11/10/15   Hillary Bow, MD  LORazepam (ATIVAN) 1 MG tablet Take 1 tablet (1 mg total) by mouth at bedtime. Patient not taking: Reported on 05/18/2016 11/10/15   Hillary Bow, MD  oxyCODONE (OXY IR/ROXICODONE) 5 MG immediate release tablet Take 1 tablet (5 mg total) by mouth every 4 (four) hours as needed for  severe pain ((for MODERATE breakthrough pain)). Patient not taking: Reported on 05/18/2016 11/10/15   Hillary Bow, MD    Allergies  Allergen Reactions  . Codeine Nausea And Vomiting  . Penicillins Hives    Has patient had a PCN reaction causing immediate rash, facial/tongue/throat swelling, SOB or lightheadedness with hypotension: no  Has patient had a PCN reaction causing severe rash involving mucus membranes or skin necrosis: no  Has patient had a PCN reaction that required hospitalization: no  Has patient had a PCN reaction occurring within the last 10 years: no  If all of the above answers are "NO", then may proceed with Cephalosporin use.   . Sulfa Antibiotics   . Tetracyclines & Related Photosensitivity  . Xarelto [Rivaroxaban]   . Doxycycline Rash  . Erythromycin Rash  . Latex Rash    Past Surgical History:  Procedure Laterality Date  . BIOPSY THYROID    . BREAST BIOPSY    . CATARACT EXTRACTION W/PHACO Left 03/23/2015   Procedure: CATARACT EXTRACTION PHACO AND INTRAOCULAR LENS PLACEMENT (IOC);  Surgeon: Birder Robson, MD;  Location: [facility];  Service: Ophthalmology;  Laterality: Left;  [place] 00:51   . CATARACT EXTRACTION W/PHACO Right 05/25/2015   Procedure: CATARACT EXTRACTION PHACO AND INTRAOCULAR LENS PLACEMENT (IOC);  Surgeon: Birder Robson, MD;  Location: [facility];  Service: Ophthalmology;  Laterality: Right;  [place] 00:57   . COLONOSCOPY  multiple  . ESOPHAGOGASTRODUODENOSCOPY  multiple  . ESOPHAGOGASTRODUODENOSCOPY (EGD) WITH PROPOFOL N/A 12/11/2014   Procedure: ESOPHAGOGASTRODUODENOSCOPY (EGD) WITH PROPOFOL;  Surgeon: [name], MD;  Location: [facility];  Service: Endoscopy;  Laterality: N/A;  cytology brushing  . HIP ARTHROPLASTY Left 03/26/2015   Procedure: ARTHROPLASTY BIPOLAR HIP (HEMIARTHROPLASTY);  Surgeon: Earnestine Leys, MD;  Location: [facility];  Service: Orthopedics;  Laterality: Left;  . HIP ARTHROPLASTY Right 11/06/2015   Procedure:  ARTHROPLASTY BIPOLAR HIP (HEMIARTHROPLASTY);  Surgeon: Thornton Park, MD;  Location: [facility];  Service: Orthopedics;  Laterality: Right;  . JOINT REPLACEMENT Left   . PERIPHERAL VASCULAR CATHETERIZATION N/A 12/27/2015   Procedure: Lower Extremity Angiography;  Surgeon: Algernon Huxley, MD;  Location: [facility];  Service: Cardiovascular;  Laterality: N/A;  . PERIPHERAL VASCULAR CATHETERIZATION  12/27/2015   Procedure: Lower Extremity Intervention;  Surgeon: Algernon Huxley, MD;  Location: [facility];  Service: Cardiovascular;;  . TOTAL ABDOMINAL HYSTERECTOMY W/ BILATERAL SALPINGOOPHORECTOMY  1990    Social History  Substance Use Topics  . Smoking status: Former Smoker    Packs/day: 0.25    Years: 40.00    Types: Cigarettes    Quit date: 10/11/2011  . Smokeless tobacco: Never Used     Comment: Quit 3 years  . Alcohol use No     Medication list has been reviewed and updated.   Physical Exam  Constitutional: She is oriented to person, place, and time. She appears well-developed. No distress.  HENT:  Head: Normocephalic and atraumatic.  Neck: Normal range of motion. Neck supple.  Cardiovascular: Normal rate, regular rhythm and normal heart sounds.   No murmur heard. Pulmonary/Chest: Effort normal and breath sounds normal. No respiratory distress.  Abdominal: Soft. There is tenderness in the right upper quadrant. There is no rigidity, no guarding and no CVA tenderness.  Musculoskeletal: She exhibits no edema.  Neurological: She is alert and oriented to person, place, and time.  Skin: Skin is warm and dry. No rash noted.  Psychiatric: She has a normal mood and affect. Her speech is normal and behavior is normal. Thought content normal. She exhibits abnormal recent memory.  Nursing note and vitals reviewed.   BP 122/68   Pulse 88   Temp 97.6 F (36.4 C)   Ht 5\' 4"  (1.626 m)   Wt 115 lb (52.2 kg)   SpO2 97%   BMI 19.74 kg/m   Assessment and Plan: 1. Paroxysmal  atrial fibrillation (HCC) In SR today by exam Continue beta blocker - Basic metabolic panel  2. Right upper quadrant abdominal pain rec [place] Continue zantac  3. Gastroesophageal reflux disease, esophagitis presence not specified - CBC with Differential/Platelet - US Abdomen Limited RUQ; Future  4. Type 2 diabetes mellitus with stage 1 chronic kidney disease, without long-term current use of insulin (HCC) Continue zanuvia and amaryl - Hemoglobin A1c  5. Urinary retention Followed by Urology - UA today essentially negative - Urine culture  6. Other insomnia - LORazepam (ATIVAN) 1 MG tablet; Take 0.5 tablets (0.5 mg total) by mouth at bedtime as needed for anxiety.  Dispense: 30 tablet; Refill: 0  7. Mixed Alzheimer's and vascular dementia Patient and family declined Namenda   Halina Maidens, MD [facility]  07/03/2016

## 2016-07-03 NOTE — Patient Instructions (Signed)
Continue Metoprolol for blood pressure and heart rate control  Continue ranitidine for heart burn  Once ultra-sound results return, will decide on next step to evaluate abdominal pain  Urine sent for culture - will call with results  Take Lorazepam 1/2 tablet at bedtime as needed for insomnia

## 2016-07-04 ENCOUNTER — Encounter: Payer: Self-pay | Admitting: Internal Medicine

## 2016-07-04 ENCOUNTER — Ambulatory Visit
Admission: RE | Admit: 2016-07-04 | Discharge: 2016-07-04 | Disposition: A | Discharge status: Home / Self Care | Payer: Medicare Other | Source: Ambulatory Visit | Attending: Internal Medicine | Admitting: Internal Medicine

## 2016-07-04 ENCOUNTER — Other Ambulatory Visit: Payer: Self-pay | Admitting: Internal Medicine

## 2016-07-04 DIAGNOSIS — E1122 Type 2 diabetes mellitus with diabetic chronic kidney disease: Secondary | ICD-10-CM | POA: Diagnosis not present

## 2016-07-04 DIAGNOSIS — R1011 Right upper quadrant pain: Secondary | ICD-10-CM | POA: Insufficient documentation

## 2016-07-04 DIAGNOSIS — K838 Other specified diseases of biliary tract: Secondary | ICD-10-CM | POA: Insufficient documentation

## 2016-07-04 DIAGNOSIS — I480 Paroxysmal atrial fibrillation: Secondary | ICD-10-CM | POA: Diagnosis not present

## 2016-07-04 DIAGNOSIS — K219 Gastro-esophageal reflux disease without esophagitis: Secondary | ICD-10-CM

## 2016-07-04 DIAGNOSIS — K829 Disease of gallbladder, unspecified: Secondary | ICD-10-CM | POA: Insufficient documentation

## 2016-07-04 DIAGNOSIS — N181 Chronic kidney disease, stage 1: Secondary | ICD-10-CM | POA: Diagnosis not present

## 2016-07-05 LAB — CBC WITH DIFFERENTIAL/PLATELET
BASOS ABS: 0 10*3/uL (ref 0.0–0.2)
BASOS: 1 %
EOS (ABSOLUTE): 0.1 10*3/uL (ref 0.0–0.4)
Eos: 2 %
Hematocrit: 39.9 % (ref 34.0–46.6)
Hemoglobin: 12.9 g/dL (ref 11.1–15.9)
IMMATURE GRANS (ABS): 0 10*3/uL (ref 0.0–0.1)
Immature Granulocytes: 0 %
LYMPHS: 23 %
Lymphocytes Absolute: 1.6 10*3/uL (ref 0.7–3.1)
MCH: 29.7 pg (ref 26.6–33.0)
MCHC: 32.3 g/dL (ref 31.5–35.7)
MCV: 92 fL (ref 79–97)
MONOS ABS: 0.6 10*3/uL (ref 0.1–0.9)
Monocytes: 9 %
NEUTROS PCT: 65 %
Neutrophils Absolute: 4.5 10*3/uL (ref 1.4–7.0)
Platelets: 255 10*3/uL (ref 150–379)
RBC: 4.34 x10E6/uL (ref 3.77–5.28)
RDW: 15.3 % (ref 12.3–15.4)
WBC: 6.8 10*3/uL (ref 3.4–10.8)

## 2016-07-05 LAB — BASIC METABOLIC PANEL
BUN/Creatinine Ratio: 19 (ref 12–28)
BUN: 16 mg/dL (ref 8–27)
CALCIUM: 9.5 mg/dL (ref 8.7–10.3)
CO2: 21 mmol/L (ref 18–29)
Chloride: 105 mmol/L (ref 96–106)
Creatinine, Ser: 0.84 mg/dL (ref 0.57–1.00)
GFR calc Af Amer: 77 mL/min/{1.73_m2} (ref >59)
GFR calc non Af Amer: 67 mL/min/{1.73_m2} (ref >59)
GLUCOSE: 156 mg/dL — AB (ref 65–99)
POTASSIUM: 4.2 mmol/L (ref 3.5–5.2)
SODIUM: 144 mmol/L (ref 134–144)

## 2016-07-05 LAB — HEMOGLOBIN A1C
Est. average glucose Bld gHb Est-mCnc: 134 mg/dL
HEMOGLOBIN A1C: 6.3 % — AB (ref 4.8–5.6)

## 2016-07-06 LAB — URINE CULTURE

## 2016-07-10 ENCOUNTER — Ambulatory Visit (INDEPENDENT_AMBULATORY_CARE_PROVIDER_SITE_OTHER): Payer: Medicare Other | Admitting: Surgery

## 2016-07-10 ENCOUNTER — Other Ambulatory Visit
Admission: RE | Admit: 2016-07-10 | Discharge: 2016-07-10 | Disposition: A | Discharge status: Home / Self Care | Payer: Medicare Other | Source: Ambulatory Visit | Attending: Surgery | Admitting: Surgery

## 2016-07-10 ENCOUNTER — Encounter: Payer: Self-pay | Admitting: Surgery

## 2016-07-10 VITALS — BP 148/77 | HR 73 | Temp 98.3°F | Ht 64.0 in | Wt 115.0 lb

## 2016-07-10 DIAGNOSIS — K828 Other specified diseases of gallbladder: Secondary | ICD-10-CM

## 2016-07-10 DIAGNOSIS — K8689 Other specified diseases of pancreas: Secondary | ICD-10-CM

## 2016-07-10 DIAGNOSIS — K869 Disease of pancreas, unspecified: Secondary | ICD-10-CM | POA: Diagnosis not present

## 2016-07-10 LAB — CBC WITH DIFFERENTIAL/PLATELET
Basophils Absolute: 0.1 10*3/uL (ref 0–0.1)
Basophils Relative: 1 %
EOS PCT: 2 %
Eosinophils Absolute: 0.1 10*3/uL (ref 0–0.7)
HCT: 35.1 % (ref 35.0–47.0)
Hemoglobin: 11.9 g/dL — ABNORMAL LOW (ref 12.0–16.0)
LYMPHS ABS: 0.8 10*3/uL — AB (ref 1.0–3.6)
LYMPHS PCT: 15 %
MCH: 30.6 pg (ref 26.0–34.0)
MCHC: 33.8 g/dL (ref 32.0–36.0)
MCV: 90.6 fL (ref 80.0–100.0)
MONO ABS: 0.7 10*3/uL (ref 0.2–0.9)
MONOS PCT: 12 %
Neutro Abs: 3.9 10*3/uL (ref 1.4–6.5)
Neutrophils Relative %: 70 %
PLATELETS: 195 10*3/uL (ref 150–440)
RBC: 3.88 MIL/uL (ref 3.80–5.20)
RDW: 16 % — ABNORMAL HIGH (ref 11.5–14.5)
WBC: 5.6 10*3/uL (ref 3.6–11.0)

## 2016-07-10 LAB — URINALYSIS, ROUTINE W REFLEX MICROSCOPIC
Bacteria, UA: NONE SEEN
GLUCOSE, UA: 50 mg/dL — AB
Ketones, ur: NEGATIVE mg/dL
Leukocytes, UA: NEGATIVE
NITRITE: NEGATIVE
PH: 5 (ref 5.0–8.0)
Protein, ur: NEGATIVE mg/dL
Specific Gravity, Urine: 1.017 (ref 1.005–1.030)
Squamous Epithelial / LPF: NONE SEEN

## 2016-07-10 LAB — COMPREHENSIVE METABOLIC PANEL
ALT: 206 U/L — AB (ref 14–54)
ANION GAP: 6 (ref 5–15)
AST: 119 U/L — ABNORMAL HIGH (ref 15–41)
Albumin: 3.4 g/dL — ABNORMAL LOW (ref 3.5–5.0)
Alkaline Phosphatase: 415 U/L — ABNORMAL HIGH (ref 38–126)
BUN: 16 mg/dL (ref 6–20)
CALCIUM: 9.2 mg/dL (ref 8.9–10.3)
CHLORIDE: 104 mmol/L (ref 101–111)
CO2: 26 mmol/L (ref 22–32)
CREATININE: 0.55 mg/dL (ref 0.44–1.00)
GFR calc Af Amer: >60 mL/min (ref >60)
Glucose, Bld: 119 mg/dL — ABNORMAL HIGH (ref 65–99)
Potassium: 3.9 mmol/L (ref 3.5–5.1)
Sodium: 136 mmol/L (ref 135–145)
Total Bilirubin: 7.6 mg/dL — ABNORMAL HIGH (ref 0.3–1.2)
Total Protein: 7.4 g/dL (ref 6.5–8.1)

## 2016-07-10 LAB — BILIRUBIN, FRACTIONATED(TOT/DIR/INDIR)
BILIRUBIN INDIRECT: 2.1 mg/dL — AB (ref 0.3–0.9)
Bilirubin, Direct: 5.2 mg/dL — ABNORMAL HIGH (ref 0.1–0.5)
Total Bilirubin: 7.3 mg/dL — ABNORMAL HIGH (ref 0.3–1.2)

## 2016-07-10 LAB — LIPASE, BLOOD: Lipase: 30 U/L (ref 11–51)

## 2016-07-10 NOTE — Progress Notes (Signed)
Patient ID: [name], female   DOB: 11-28-1935, 81 y.o.   MRN: [id number]  HPI [name] is a 81 y.o. female seen in consultation for her jaundice and referred by Dr. Army Melia. She has significant comorbidities including dementia, A. fib not on anticoagulation, CHF. For last few months have complains of intermittent right upper quadrant pain and according to the family for the last month or so she has had intermittent jaundice. Denies any fevers or any chills, there is no evidence of cholangitis. She does have tea-colored urine and some sediment within the urine. She also had some claylike stool. Recent workup included an ultrasound that I have personally reviewed showing evidence of dilation of the common bile duct 10 millimeters w sludge. The gallbladder is filled with sludge. She lives with her husband and had bilateral hip replacement last year and has some difficulty with mobility. She is able to stand up and she is continent for both urine and stool  HPI  Past Medical History:  Diagnosis Date  . Ankylosing spondylitis ([place])    ? how diagnosed  . Anxiety   . Asthma    as child  . Chronic diastolic CHF (congestive heart failure) ([place])    a. 03/2015 Echo: EF 55-60%, Gr 1 DD.  Marland Kitchen Chronic kidney infection   . Chronic peptic ulcer, unspecified site, without mention of hemorrhage, perforation, or obstruction   . COPD (chronic obstructive pulmonary disease) ([place])   . COPD (chronic obstructive pulmonary disease) ([place])    a.Uses 2l prn - followed by Dr. Raul Del.  . Diabetes mellitus ([place])   . DM (diabetes mellitus) ([place])   . GERD (gastroesophageal reflux disease)   . Headache   . History of kidney stones   . Hypothyroidism    mass, U/S 6/21 - nodules  . IBS (irritable bowel syndrome)   . Insomnia, unspecified   . Left displaced femoral neck fracture ([place])    a. 03/2015 s/p L hemiarthroplasty.  . Lumbar spondylolysis   . Mitral valve prolapse    a. Not noted on  03/2015 Echo.  . Motion sickness    car - back seat  . Neuropathy ([place])   . Osteoarthritis   . PAF (paroxysmal atrial fibrillation) ([place])    a. 03/2015 in setting of hip Fx->Amio/Xarelo;  b. CHA2DS2VASc= 4.  . Palpitations   . Raynaud disease   . Raynaud's disease   . Rheumatic fever   . Systemic lupus erythematosus ([place])   . Uveitis, anterior    ????  . Wears contact lenses   . Wears dentures    full upper, partial lower  . Wheezing     Past Surgical History:  Procedure Laterality Date  . BIOPSY THYROID    . BREAST BIOPSY    . CATARACT EXTRACTION W/PHACO Left 03/23/2015   Procedure: CATARACT EXTRACTION PHACO AND INTRAOCULAR LENS PLACEMENT (IOC);  Surgeon: Birder Robson, MD;  Location: [facility];  Service: Ophthalmology;  Laterality: Left;  [place] 00:51   . CATARACT EXTRACTION W/PHACO Right 05/25/2015   Procedure: CATARACT EXTRACTION PHACO AND INTRAOCULAR LENS PLACEMENT (IOC);  Surgeon: Birder Robson, MD;  Location: [facility];  Service: Ophthalmology;  Laterality: Right;  [place] 00:57   . COLONOSCOPY  multiple  . ESOPHAGOGASTRODUODENOSCOPY  multiple  . ESOPHAGOGASTRODUODENOSCOPY (EGD) WITH PROPOFOL N/A 12/11/2014   Procedure: ESOPHAGOGASTRODUODENOSCOPY (EGD) WITH PROPOFOL;  Surgeon: [name], MD;  Location: [facility];  Service: Endoscopy;  Laterality: N/A;  cytology brushing  . HIP ARTHROPLASTY Left  03/26/2015   Procedure: ARTHROPLASTY BIPOLAR HIP (HEMIARTHROPLASTY);  Surgeon: Earnestine Leys, MD;  Location: [facility];  Service: Orthopedics;  Laterality: Left;  . HIP ARTHROPLASTY Right 11/06/2015   Procedure: ARTHROPLASTY BIPOLAR HIP (HEMIARTHROPLASTY);  Surgeon: Thornton Park, MD;  Location: [facility];  Service: Orthopedics;  Laterality: Right;  . JOINT REPLACEMENT Left   . PERIPHERAL VASCULAR CATHETERIZATION N/A 12/27/2015   Procedure: Lower Extremity Angiography;  Surgeon: Algernon Huxley, MD;  Location: [place] [facility] [facility];  Service: Cardiovascular;  Laterality: N/A;  .  PERIPHERAL VASCULAR CATHETERIZATION  12/27/2015   Procedure: Lower Extremity Intervention;  Surgeon: Algernon Huxley, MD;  Location: [place] [facility];  Service: Cardiovascular;;  . TOTAL ABDOMINAL HYSTERECTOMY W/ BILATERAL SALPINGOOPHORECTOMY  1990    Family History  Problem Relation Age of Onset  . Heart disease Mother   . Hypothyroidism Mother   . Diabetes Mother   . Heart attack Mother   . Hypertension Mother   . Heart disease Father   . Heart attack Father   . Breast cancer Sister   . Hypothyroidism Sister   . Diabetes Sister   . Ovarian cancer Sister   . Diabetes Brother   . Prostate cancer Brother   . Hypothyroidism Daughter   . Kidney Stones Brother   . Kidney disease Neg Hx   . Bladder Cancer Neg Hx     Social History Social History  Substance Use Topics  . Smoking status: Former Smoker    Packs/day: 0.25    Years: 40.00    Types: Cigarettes    Quit date: 10/11/2011  . Smokeless tobacco: Never Used     Comment: Quit 3 years  . Alcohol use No    Allergies  Allergen Reactions  . Codeine Nausea And Vomiting  . Penicillins Hives    Has patient had a PCN reaction causing immediate rash, facial/tongue/throat swelling, SOB or lightheadedness with hypotension: no  Has patient had a PCN reaction causing severe rash involving mucus membranes or skin necrosis: no  Has patient had a PCN reaction that required hospitalization: no  Has patient had a PCN reaction occurring within the last 10 years: no  If all of the above answers are "NO", then may proceed with Cephalosporin use.   . Sulfa Antibiotics   . Tetracyclines & Related Photosensitivity  . Xarelto [Rivaroxaban]   . Doxycycline Rash  . Erythromycin Rash  . Latex Rash    Current Outpatient Prescriptions  Medication Sig Dispense Refill  . albuterol (PROVENTIL) (2.5 MG/3ML) 0.083% nebulizer solution Take 2.5 mg by nebulization every 6 (six) hours as needed for wheezing or shortness of breath. Reported on  07/09/2015    . amiodarone (PACERONE) 200 MG tablet Take 1 tablet (200 mg total) by mouth daily. 90 tablet 0  . docusate sodium (COLACE) 100 MG capsule Take 1 capsule (100 mg total) by mouth 2 (two) times daily. 10 capsule 0  . feeding supplement, ENSURE ENLIVE, (ENSURE ENLIVE) LIQD Take 237 mLs by mouth 2 (two) times daily between meals. 237 mL 12  . glimepiride (AMARYL) 2 MG tablet Take 1 tablet (2 mg total) by mouth daily before breakfast. 30 tablet 5  . JANUVIA 100 MG tablet Take 0.5 tablets (50 mg total) by mouth daily. (Patient taking differently: Take 100 mg by mouth daily. ) 90 tablet 0  . LORazepam (ATIVAN) 1 MG tablet Take 0.5 tablets (0.5 mg total) by mouth at bedtime as needed for anxiety. 30 tablet 0  . metoprolol tartrate (  LOPRESSOR) 25 MG tablet Take 1 tablet (25 mg total) by mouth 2 (two) times daily. 180 tablet 1  . mometasone-formoterol (DULERA) 100-5 MCG/ACT AERO Inhale 2 puffs into the lungs 2 (two) times daily. 13 g 5  . Probiotic Product (ALIGN) 4 MG CAPS Take 1 capsule (4 mg total) by mouth daily. 90 capsule 1  . ranitidine (ZANTAC) 150 MG tablet Take 1 tablet (150 mg total) by mouth 2 (two) times daily. 180 tablet 1  . senna (SENOKOT) 8.6 MG TABS tablet Take 1 tablet by mouth 2 (two) times daily.    Marland Kitchen tiotropium (SPIRIVA) 18 MCG inhalation capsule Place 1 capsule (18 mcg total) into inhaler and inhale daily. 90 capsule 1  . ferrous sulfate 325 (65 FE) MG tablet Take 1 tablet (325 mg total) by mouth 2 (two) times daily with a meal. (Patient not taking: Reported on 05/18/2016)  3  . oxyCODONE (OXY IR/ROXICODONE) 5 MG immediate release tablet Take 1 tablet (5 mg total) by mouth every 4 (four) hours as needed for severe pain ((for MODERATE breakthrough pain)). (Patient not taking: Reported on 05/18/2016) 30 tablet 0   No current facility-administered medications for this visit.      Review of Systems A 10 point review of systems was asked and was negative except for the  information on the HPI  Physical Exam Blood pressure (!) 148/77, pulse 73, temperature 98.3 F (36.8 C), temperature source Oral, height 5\' 4"  (1.626 m), weight 52.2 kg (115 lb). CONSTITUTIONAL: NAD, jaundice EYES: Pupils are equal, round, and reactive to light, Sclera incteric EARS, NOSE, MOUTH AND THROAT: The oropharynx is clear. The oral mucosa is pink and moist. Hearing is intact to voice. LYMPH NODES:  Lymph nodes in the neck are normal. RESPIRATORY:  Lungs are clear. There is normal respiratory effort, with equal breath sounds bilaterally, and without pathologic use of accessory muscles. CARDIOVASCULAR: Heart is regular without murmurs, gallops, or rubs. GI: The abdomen is  soft, There is no hepatosplenomegaly. There are normal bowel sounds in all quadrants. Mild discomfort diffusely, no specific point tenderness, no peritonitis, Neg murphy. GU: Rectal deferred.   MUSCULOSKELETAL: Normal muscle strength and tone. No cyanosis or edema.   SKIN: Turgor is good and there are no pathologic skin lesions or ulcers. NEUROLOGIC: Motor and sensation is grossly normal. Cranial nerves are grossly intact. PSYCH:  Oriented to person, place and time. Affect is normal.  Data Reviewed I have personally reviewed the patient's imaging, laboratory findings and medical records.    Assessment/Plan Obstructive jaundice in an elderly patient with dementia but fairly functional. We'll definitely need to work this up further and I will order a complete metabolic panel with indirect and direct bilirubin, I'll also make an appointment with GI for potential ERCP. There is no evidence of cholangitis at this point and she does not need an emergent surgical intervention. Also differential will include pancreatic mass versus a retained common bile duct stone. Have discussed with the patient and with the family in detail I have provided extensive counseling. I will also order a CT scan of the abdomen to rule out  pancreatic cancer.   Caroleen Hamman, MD FACS General Surgeon 07/10/2016, 3:30 PM

## 2016-07-10 NOTE — Patient Instructions (Addendum)
We will send you to the Lab today at the [facility] to have your lab and urine testing done.  Directions to Medical Mall: When leaving our office, go right. Go all of the way down to the very end of the hallway. You will have a purple wall in front of you. You will now have a tunnel to the hospital on your left hand side. Go through this tunnel and the elevators will be on your left. Go down to the 1st floor and take a slight left. The very first desk on the right hand side is the registration desk.   We will have your CT scan of your Abdomen and Pelvis done tomorrow (07/11/16) in the [place] at [facility]. You will arrive to the [facility] at 0945am. The only prep for this exam is you may ONLY drink water after 0600 in the morning. No food.  We will see you back in the office on Wednesday as scheduled below.  We have you tentatively scheduled on Thursday at 1pm in the [facility] office with Dr. Allen Norris.

## 2016-07-11 ENCOUNTER — Ambulatory Visit
Admission: RE | Admit: 2016-07-11 | Discharge: 2016-07-11 | Disposition: A | Discharge status: Home / Self Care | Payer: Medicare Other | Source: Ambulatory Visit | Attending: Surgery | Admitting: Surgery

## 2016-07-11 DIAGNOSIS — C787 Secondary malignant neoplasm of liver and intrahepatic bile duct: Secondary | ICD-10-CM | POA: Insufficient documentation

## 2016-07-11 DIAGNOSIS — R1011 Right upper quadrant pain: Secondary | ICD-10-CM | POA: Diagnosis not present

## 2016-07-11 DIAGNOSIS — K8689 Other specified diseases of pancreas: Secondary | ICD-10-CM

## 2016-07-11 DIAGNOSIS — K869 Disease of pancreas, unspecified: Secondary | ICD-10-CM | POA: Insufficient documentation

## 2016-07-11 DIAGNOSIS — I700 Atherosclerosis of aorta: Secondary | ICD-10-CM | POA: Insufficient documentation

## 2016-07-11 DIAGNOSIS — N200 Calculus of kidney: Secondary | ICD-10-CM | POA: Diagnosis not present

## 2016-07-11 MED ORDER — IOPAMIDOL (ISOVUE-370) INJECTION 76%
100.0000 mL | Freq: Once | INTRAVENOUS | Status: AC | PRN
Start: 2016-07-11 — End: 2016-07-11
  Administered 2016-07-11: 100 mL via INTRAVENOUS

## 2016-07-12 ENCOUNTER — Ambulatory Visit: Payer: Self-pay | Admitting: Surgery

## 2016-07-13 ENCOUNTER — Ambulatory Visit: Payer: Medicare Other | Admitting: General Surgery

## 2016-07-13 ENCOUNTER — Encounter: Payer: Self-pay | Admitting: Gastroenterology

## 2016-07-13 ENCOUNTER — Telehealth: Payer: Self-pay

## 2016-07-13 ENCOUNTER — Other Ambulatory Visit: Payer: Self-pay

## 2016-07-13 ENCOUNTER — Ambulatory Visit (INDEPENDENT_AMBULATORY_CARE_PROVIDER_SITE_OTHER): Payer: Medicare Other | Admitting: Gastroenterology

## 2016-07-13 ENCOUNTER — Ambulatory Visit: Payer: Medicare Other | Admitting: Podiatry

## 2016-07-13 VITALS — BP 118/61 | HR 76 | Temp 98.1°F | Ht 64.0 in | Wt 115.0 lb

## 2016-07-13 DIAGNOSIS — K8689 Other specified diseases of pancreas: Secondary | ICD-10-CM

## 2016-07-13 DIAGNOSIS — R748 Abnormal levels of other serum enzymes: Secondary | ICD-10-CM | POA: Diagnosis not present

## 2016-07-13 DIAGNOSIS — K869 Disease of pancreas, unspecified: Secondary | ICD-10-CM | POA: Diagnosis not present

## 2016-07-13 NOTE — Telephone Encounter (Signed)
Dr. Allen Norris is seeing patient at this time and will perform ERCP tomorrow.   We have cancelled surgical appointment for patient after speaking with surgeon and will arrange urgent referral for Oncology. Will call patient with appointment information as soon as this is available.

## 2016-07-13 NOTE — Progress Notes (Signed)
Gastroenterology Consultation  Referring Provider:     Glean Hess, MD Primary Care Physician:  Halina Maidens, MD Primary Gastroenterologist:  Dr. Allen Norris     Reason for Consultation:     Pancreatic mass        HPI:   [name] is a 81 y.o. y/o female referred for consultation & management of Pancreatic mass by Dr. Halina Maidens, MD.  This patient comes in today after being seen by surgery for jaundice. The patient had a CT scan that showed a mass in the pancreas that was suggestive of malignancy. The patient has been jaundiced over the last 3 weeks. The patient also states that she has nausea in the morning and usually vomits. The patient also reports that she's had some epigastric discomfort for months. There is no report of any fevers or chills but the patient states she has a was felt cold throughout her life. There is no previous history of jaundice. The patient also reports that her stools are clay colored and her urine is very dark. The patient had a CT scan and was supposed to follow-up with surgery this afternoon it does not know the results of the CT scan until I told her about it today.  Past Medical History:  Diagnosis Date  . Ankylosing spondylitis ([place])    ? how diagnosed  . Anxiety   . Asthma    as child  . Chronic diastolic CHF (congestive heart failure) ([place])    a. 03/2015 Echo: EF 55-60%, Gr 1 DD.  Marland Kitchen Chronic kidney infection   . Chronic peptic ulcer, unspecified site, without mention of hemorrhage, perforation, or obstruction   . COPD (chronic obstructive pulmonary disease) ([place])   . COPD (chronic obstructive pulmonary disease) ([place])    a.Uses 2l prn - followed by Dr. Raul Del.  . Diabetes mellitus ([place])   . DM (diabetes mellitus) ([place])   . GERD (gastroesophageal reflux disease)   . Headache   . History of kidney stones   . Hypothyroidism    mass, U/S 6/21 - nodules  . IBS (irritable bowel syndrome)   . Insomnia, unspecified   . Left displaced  femoral neck fracture ([place])    a. 03/2015 s/p L hemiarthroplasty.  . Lumbar spondylolysis   . Mitral valve prolapse    a. Not noted on 03/2015 Echo.  . Motion sickness    car - back seat  . Neuropathy ([place])   . Osteoarthritis   . PAF (paroxysmal atrial fibrillation) ([place])    a. 03/2015 in setting of hip Fx->Amio/Xarelo;  b. CHA2DS2VASc= 4.  . Palpitations   . Raynaud disease   . Raynaud's disease   . Rheumatic fever   . Systemic lupus erythematosus ([place])   . Uveitis, anterior    ????  . Wears contact lenses   . Wears dentures    full upper, partial lower  . Wheezing     Past Surgical History:  Procedure Laterality Date  . BIOPSY THYROID    . BREAST BIOPSY    . CATARACT EXTRACTION W/PHACO Left 03/23/2015   Procedure: CATARACT EXTRACTION PHACO AND INTRAOCULAR LENS PLACEMENT (IOC);  Surgeon: Birder Robson, MD;  Location: [facility];  Service: Ophthalmology;  Laterality: Left;  [place] 00:51   . CATARACT EXTRACTION W/PHACO Right 05/25/2015   Procedure: CATARACT EXTRACTION PHACO AND INTRAOCULAR LENS PLACEMENT (IOC);  Surgeon: Birder Robson, MD;  Location: [facility];  Service: Ophthalmology;  Laterality: Right;  [place] 00:57   . COLONOSCOPY  multiple  . ESOPHAGOGASTRODUODENOSCOPY  multiple  . ESOPHAGOGASTRODUODENOSCOPY (EGD) WITH PROPOFOL N/A 12/11/2014   Procedure: ESOPHAGOGASTRODUODENOSCOPY (EGD) WITH PROPOFOL;  Surgeon: [name], MD;  Location: [place];  Service: Endoscopy;  Laterality: N/A;  cytology brushing  . HIP ARTHROPLASTY Left 03/26/2015   Procedure: ARTHROPLASTY BIPOLAR HIP (HEMIARTHROPLASTY);  Surgeon: Earnestine Leys, MD;  Location: [facility];  Service: Orthopedics;  Laterality: Left;  . HIP ARTHROPLASTY Right 11/06/2015   Procedure: ARTHROPLASTY BIPOLAR HIP (HEMIARTHROPLASTY);  Surgeon: Thornton Park, MD;  Location: [facility];  Service: Orthopedics;  Laterality: Right;  . JOINT REPLACEMENT Left   . PERIPHERAL VASCULAR CATHETERIZATION N/A 12/27/2015   Procedure:  Lower Extremity Angiography;  Surgeon: Algernon Huxley, MD;  Location: [facility];  Service: Cardiovascular;  Laterality: N/A;  . PERIPHERAL VASCULAR CATHETERIZATION  12/27/2015   Procedure: Lower Extremity Intervention;  Surgeon: Algernon Huxley, MD;  Location: [facility];  Service: Cardiovascular;;  . TOTAL ABDOMINAL HYSTERECTOMY W/ BILATERAL SALPINGOOPHORECTOMY  1990    Prior to Admission medications   Medication Sig Start Date End Date Taking? Authorizing Provider  albuterol (PROVENTIL) (2.5 MG/3ML) 0.083% nebulizer solution Take 2.5 mg by nebulization every 6 (six) hours as needed for wheezing or shortness of breath. Reported on 07/09/2015   Yes Historical Provider, MD  amiodarone (PACERONE) 200 MG tablet Take 1 tablet (200 mg total) by mouth daily. 02/04/16  Yes Glean Hess, MD  docusate sodium (COLACE) 100 MG capsule Take 1 capsule (100 mg total) by mouth 2 (two) times daily. 11/10/15  Yes Srikar Sudini, MD  feeding supplement, ENSURE ENLIVE, (ENSURE ENLIVE) LIQD Take 237 mLs by mouth 2 (two) times daily between meals. 11/10/15  Yes Srikar Sudini, MD  glimepiride (AMARYL) 2 MG tablet Take 1 tablet (2 mg total) by mouth daily before breakfast. 04/04/16  Yes Glean Hess, MD  LORazepam (ATIVAN) 1 MG tablet Take 0.5 tablets (0.5 mg total) by mouth at bedtime as needed for anxiety. 07/03/16  Yes Glean Hess, MD  metoprolol tartrate (LOPRESSOR) 25 MG tablet Take 1 tablet (25 mg total) by mouth 2 (two) times daily. 02/15/16  Yes Glean Hess, MD  mometasone-formoterol [facility]) 100-5 MCG/ACT AERO Inhale 2 puffs into the lungs 2 (two) times daily. 07/16/15  Yes Glean Hess, MD  Probiotic Product (ALIGN) 4 MG CAPS Take 1 capsule (4 mg total) by mouth daily. 06/22/15  Yes Glean Hess, MD  ranitidine (ZANTAC) 150 MG tablet Take 1 tablet (150 mg total) by mouth 2 (two) times daily. 02/03/16  Yes Glean Hess, MD  senna (SENOKOT) 8.6 MG TABS tablet Take 1 tablet by mouth 2  (two) times daily.   Yes Historical Provider, MD  tiotropium (SPIRIVA) 18 MCG inhalation capsule Place 1 capsule (18 mcg total) into inhaler and inhale daily. 02/22/16  Yes Glean Hess, MD  ferrous sulfate 325 (65 FE) MG tablet Take 1 tablet (325 mg total) by mouth 2 (two) times daily with a meal. Patient not taking: Reported on 05/18/2016 11/10/15   Srikar Sudini, MD  JANUVIA 100 MG tablet Take 0.5 tablets (50 mg total) by mouth daily. Patient not taking: Reported on 07/13/2016 02/04/16   Glean Hess, MD  oxyCODONE (OXY IR/ROXICODONE) 5 MG immediate release tablet Take 1 tablet (5 mg total) by mouth every 4 (four) hours as needed for severe pain ((for MODERATE breakthrough pain)). Patient not taking: Reported on 05/18/2016 11/10/15   Hillary Bow, MD    Family  History  Problem Relation Age of Onset  . Heart disease Mother   . Hypothyroidism Mother   . Diabetes Mother   . Heart attack Mother   . Hypertension Mother   . Heart disease Father   . Heart attack Father   . Breast cancer Sister   . Hypothyroidism Sister   . Diabetes Sister   . Ovarian cancer Sister   . Diabetes Brother   . Prostate cancer Brother   . Hypothyroidism Daughter   . Kidney Stones Brother   . Kidney disease Neg Hx   . Bladder Cancer Neg Hx      Social History  Substance Use Topics  . Smoking status: Former Smoker    Packs/day: 0.25    Years: 40.00    Types: Cigarettes    Quit date: 10/11/2011  . Smokeless tobacco: Never Used     Comment: Quit 3 years  . Alcohol use No    Allergies as of 07/13/2016 - Review Complete 07/13/2016  Allergen Reaction Noted  . Codeine Nausea And Vomiting 12/09/2014  . Penicillins Hives 04/18/2013  . Sulfa antibiotics  04/18/2013  . Tetracyclines & related Photosensitivity 12/12/2013  . Xarelto [rivaroxaban]  02/21/2016  . Doxycycline Rash 03/23/2015  . Erythromycin Rash 04/18/2013  . Latex Rash 04/18/2013    Review of Systems:    All systems reviewed and negative  except where noted in HPI.   Physical Exam:  BP 118/61   Pulse 76   Temp 98.1 F (36.7 C) (Oral)   Ht 5\' 4"  (1.626 m)   Wt 115 lb (52.2 kg)   BMI 19.74 kg/m  No LMP recorded. Patient has had a hysterectomy. Psych:  Alert and cooperative. Normal mood and affect. General:   Alert,  Well-developed, well-nourished, pleasant and cooperative in NAD Head:  Normocephalic and atraumatic. Eyes:  Sclera clear, Positive scleral icterus.   Conjunctiva yellow. Ears:  Normal auditory acuity. Nose:  No deformity, discharge, or lesions. Mouth:  No deformity or lesions,oropharynx pink & moist. Neck:  Supple; no masses or thyromegaly. Lungs:  Respirations even and unlabored.  Clear throughout to auscultation.   No wheezes, crackles, or rhonchi. No acute distress. Heart:  Regular rate and rhythm; no murmurs, clicks, rubs, or gallops. Abdomen:  Normal bowel sounds.  No bruits.  Soft, non-tender and non-distended without masses, hepatosplenomegaly or hernias noted.  No guarding or rebound tenderness.  Negative Carnett sign.   Rectal:  Deferred.  Msk:  Symmetrical without gross deformities.  Good, equal movement & strength bilaterally. Pulses:  Normal pulses noted. Extremities:  No clubbing or edema.  No cyanosis. Neurologic:  Alert and oriented x3;  grossly normal neurologically. Skin:  Intact without significant lesions or rashes.  Positive jaundice. Lymph Nodes:  No significant cervical adenopathy. Psych:  Alert and cooperative. Normal mood and affect.  Imaging Studies: Ct Pancreas Abdomen W Wo Contrast  Result Date: 07/11/2016 CLINICAL DATA:  Right upper quadrant pain. Biliary ductal dilatation and hepatic masses on recent ultrasound. EXAM: CT ABDOMEN WITHOUT AND WITH CONTRAST TECHNIQUE: Multidetector CT imaging of the abdomen was performed following the standard protocol before and following the bolus administration of intravenous contrast. CONTRAST:  100 mL Isovue 370 COMPARISON:  Ultrasound  07/04/2016 and CT 01/15/2016 FINDINGS: Lower chest: No acute findings.  Mild bibasilar scarring. Hepatobiliary: Several small new enhancing low-attenuation masses are seen right and left hepatic lobes. Index lesion in segment 2 of the left lobe measures 1.8 x 1.4 cm on image 18/4. Index lesion  in the anterior segment of right lobe measures 1.9 x 1.9 cm on image 29/4. These are consistent with liver metastases. Marked diffuse biliary ductal dilatation is seen as well as distended gallbladder. No evidence of acute cholecystitis. Stricture of distal common bile duct is seen in the pancreatic head. Pancreas: Diffuse pancreatic ductal dilatation is seen. A low-attenuation mass is seen in the pancreatic head measuring 2.9 x 1.7 cm on image 46/for, highly suspicious for pancreatic adenocarcinoma. This mass shows no evidence of arterial or venous vascular involvement. Spleen:  Within normal limits in size and appearance. Adrenals/Urinary Tract: Diffuse thickening of left adrenal gland is seen without evidence of discrete mass. This is most likely due to mild adrenal hyperplasia. No renal masses identified. Several tiny less than 5 mm calculi are seen in upper pole of right kidney. No evidence of hydronephrosis. Stomach/Bowel: Visualized portions within the abdomen are unremarkable. Vascular/Lymphatic: No pathologically enlarged lymph nodes identified. No abdominal aortic aneurysm. Aortic atherosclerosis. Other:  None. Musculoskeletal:  No suspicious bone lesions identified. IMPRESSION: 2.9 cm low-attenuation mass in pancreatic head, highly suspicious for pancreatic carcinoma. This causes diffuse biliary and pancreatic ductal dilatation, but shows no evidence of vascular involvement. Several small liver metastases in right and left hepatic lobes. No other sites of metastatic disease identified within the abdomen. Incidental findings including non obstructing right renal calculi and aortic atherosclerosis. Electronically  Signed   By: Earle Gell M.D.   On: 07/11/2016 10:49   US Abdomen Limited Ruq  Result Date: 07/04/2016 CLINICAL DATA:  Right upper quadrant abdominal pain for the past month EXAM: US ABDOMEN LIMITED - RIGHT UPPER QUADRANT COMPARISON:  Noncontrast abdominal CT scan of January 15, 2016 FINDINGS: Gallbladder: Gallbladder appears mildly distended and is filled with echogenic material. Doppler interrogation reveals some Doppler signal intraluminal length. No discrete stones are observed. There is no positive sonographic Murphy's sign. The gallbladder wall is not clearly thickened. Common bile duct: Diameter: 10 mm. There are intraluminal echoes within the dilated common bile duct consistent with sludge. Liver: The hepatic echotexture is heterogeneous. There is mild intrahepatic ductal dilation. There are hypoechoic foci within the liver. Two in the right lobe measure 1.4 x 1.0 x 1.5 cm and 0.9 x 0.9 x 0.6 cm. A hypoechoic focus in the left lobe measures 0.8 x 0.8 x 1.0 cm. IMPRESSION: The gallbladder is filled with echogenic material which is likely sludge, as is the mildly dilated common bile duct. No discrete stones are observed. No positive sonographic Murphy's sign. Dilated common bile duct and intrahepatic ducts likely reflect the presence of partially obstructing sludge or stones in the distal common bile duct. Hypoechoic foci within the right and left hepatic lobes are nonspecific but do not reflect simple cysts. Hepatic protocol MRI is recommended. Electronically Signed   By: David  Martinique M.D.   On: 07/04/2016 11:43    Assessment and Plan:   Kareem Didonato is a 81 y.o. y/o female who comes in today with a pancreatic head mass and painless jaundice although she reports intermittent pain for months. The patient has been jaundice for the last few weeks. The patient had a CT scan that showed a pancreatic head mass. The patient will be set up for an ERCP for tomorrow. The patient will also not follow-up  with surgery today since she has early gotten the diagnosis. I spoken to surgery who will set her up to be followed by oncology and put in the referral for that. The patient will  need an EUS and possible surgical evaluation to see if she is a candidate for pancreatic head tumor removal. The patient has been told of the risks of the ERCP including risks of anesthesia, bleeding, infection, perforation and pancreatitis. She has also been told the risk of death from pancreatitis. The patient agrees with proceeding with ERCP for tomorrow. The patient will also have her blood checked for CA 19-9 today. The patient has been explained the plan and agrees with it.    [name], MD. Marval Regal   Note: This dictation was prepared with Dragon dictation along with smaller phrase technology. Any transcriptional errors that result from this process are unintentional.

## 2016-07-14 ENCOUNTER — Encounter
Admission: RE | Disposition: A | Discharge status: Home / Self Care | Payer: Self-pay | Source: Ambulatory Visit | Attending: Gastroenterology

## 2016-07-14 ENCOUNTER — Ambulatory Visit
Admission: RE | Admit: 2016-07-14 | Discharge: 2016-07-14 | Disposition: A | Discharge status: Home / Self Care | Payer: Medicare Other | Source: Ambulatory Visit | Attending: Gastroenterology | Admitting: Gastroenterology

## 2016-07-14 ENCOUNTER — Ambulatory Visit: Payer: Medicare Other | Admitting: Anesthesiology

## 2016-07-14 ENCOUNTER — Encounter: Payer: Self-pay | Admitting: *Deleted

## 2016-07-14 DIAGNOSIS — Z87891 Personal history of nicotine dependence: Secondary | ICD-10-CM | POA: Diagnosis not present

## 2016-07-14 DIAGNOSIS — Z87442 Personal history of urinary calculi: Secondary | ICD-10-CM | POA: Diagnosis not present

## 2016-07-14 DIAGNOSIS — F419 Anxiety disorder, unspecified: Secondary | ICD-10-CM | POA: Diagnosis not present

## 2016-07-14 DIAGNOSIS — Z79899 Other long term (current) drug therapy: Secondary | ICD-10-CM | POA: Insufficient documentation

## 2016-07-14 DIAGNOSIS — K219 Gastro-esophageal reflux disease without esophagitis: Secondary | ICD-10-CM | POA: Diagnosis not present

## 2016-07-14 DIAGNOSIS — K8689 Other specified diseases of pancreas: Secondary | ICD-10-CM | POA: Diagnosis not present

## 2016-07-14 DIAGNOSIS — K831 Obstruction of bile duct: Secondary | ICD-10-CM | POA: Diagnosis not present

## 2016-07-14 DIAGNOSIS — Z7951 Long term (current) use of inhaled steroids: Secondary | ICD-10-CM | POA: Diagnosis not present

## 2016-07-14 DIAGNOSIS — Z96643 Presence of artificial hip joint, bilateral: Secondary | ICD-10-CM | POA: Insufficient documentation

## 2016-07-14 DIAGNOSIS — I5032 Chronic diastolic (congestive) heart failure: Secondary | ICD-10-CM | POA: Insufficient documentation

## 2016-07-14 DIAGNOSIS — I509 Heart failure, unspecified: Secondary | ICD-10-CM | POA: Diagnosis not present

## 2016-07-14 DIAGNOSIS — K838 Other specified diseases of biliary tract: Secondary | ICD-10-CM | POA: Diagnosis not present

## 2016-07-14 DIAGNOSIS — Z7984 Long term (current) use of oral hypoglycemic drugs: Secondary | ICD-10-CM | POA: Insufficient documentation

## 2016-07-14 DIAGNOSIS — E1140 Type 2 diabetes mellitus with diabetic neuropathy, unspecified: Secondary | ICD-10-CM | POA: Insufficient documentation

## 2016-07-14 DIAGNOSIS — I480 Paroxysmal atrial fibrillation: Secondary | ICD-10-CM | POA: Diagnosis not present

## 2016-07-14 DIAGNOSIS — J449 Chronic obstructive pulmonary disease, unspecified: Secondary | ICD-10-CM | POA: Diagnosis not present

## 2016-07-14 DIAGNOSIS — D490 Neoplasm of unspecified behavior of digestive system: Secondary | ICD-10-CM

## 2016-07-14 HISTORY — PX: ERCP: SHX5425

## 2016-07-14 LAB — CA 19-9 (SERIAL): CA 19 9: 147 U/mL — AB (ref 0–35)

## 2016-07-14 SURGERY — ERCP, WITH INTERVENTION IF INDICATED
Anesthesia: General

## 2016-07-14 MED ORDER — LIDOCAINE HCL (PF) 2 % IJ SOLN
INTRAMUSCULAR | Status: AC
  Filled 2016-07-14: qty 2

## 2016-07-14 MED ORDER — PROPOFOL 10 MG/ML IV BOLUS
INTRAVENOUS | Status: DC | PRN
  Administered 2016-07-14: 10 mg via INTRAVENOUS

## 2016-07-14 MED ORDER — MIDAZOLAM HCL 2 MG/2ML IJ SOLN
INTRAMUSCULAR | Status: AC
  Filled 2016-07-14: qty 2

## 2016-07-14 MED ORDER — SODIUM CHLORIDE 0.9 % IV SOLN
INTRAVENOUS | Status: DC
  Administered 2016-07-14 (×2): via INTRAVENOUS

## 2016-07-14 MED ORDER — PROPOFOL 500 MG/50ML IV EMUL
INTRAVENOUS | Status: AC
  Filled 2016-07-14: qty 50

## 2016-07-14 MED ORDER — FENTANYL CITRATE (PF) 100 MCG/2ML IJ SOLN
INTRAMUSCULAR | Status: AC
  Filled 2016-07-14: qty 2

## 2016-07-14 MED ORDER — INDOMETHACIN 50 MG RE SUPP
100.0000 mg | Freq: Once | RECTAL | Status: AC
  Administered 2016-07-14: 100 mg via RECTAL

## 2016-07-14 MED ORDER — PROPOFOL 500 MG/50ML IV EMUL
INTRAVENOUS | Status: DC | PRN
  Administered 2016-07-14: 140 ug/kg/min via INTRAVENOUS

## 2016-07-14 MED ORDER — FENTANYL CITRATE (PF) 100 MCG/2ML IJ SOLN
INTRAMUSCULAR | Status: DC | PRN
  Administered 2016-07-14: 50 ug via INTRAVENOUS

## 2016-07-14 NOTE — Anesthesia Post-op Follow-up Note (Cosign Needed)
Anesthesia QCDR form completed.        

## 2016-07-14 NOTE — Anesthesia Postprocedure Evaluation (Signed)
Anesthesia Post Note  Patient: [name]  Procedure(s) Performed: Procedure(s) (LRB): ENDOSCOPIC RETROGRADE CHOLANGIOPANCREATOGRAPHY (ERCP) (N/A)  Patient location during evaluation: PACU Anesthesia Type: MAC Pain management: pain level controlled Vital Signs Assessment: post-procedure vital signs reviewed and stable Respiratory status: spontaneous breathing Cardiovascular status: stable Anesthetic complications: no     Last Vitals:  Vitals:   07/14/16 1127 07/14/16 1304  BP: 98/79   Pulse: 79   Resp: 20 16  Temp: (!) 36.1 C (!) 35.7 C    Last Pain:  Vitals:   07/14/16 1304  TempSrc: Tympanic                 VAN STAVEREN,Burris Matherne

## 2016-07-14 NOTE — Anesthesia Procedure Notes (Signed)
Performed by: COOK-MARTIN, Tiny Chaudhary Pre-anesthesia Checklist: Patient identified, Emergency Drugs available, Suction available, Patient being monitored and Timeout performed Patient Re-evaluated:Patient Re-evaluated prior to inductionOxygen Delivery Method: Nasal cannula Preoxygenation: Pre-oxygenation with 100% oxygen Intubation Type: IV induction Airway Equipment and Method: Bite block Placement Confirmation: positive ETCO2 and CO2 detector     

## 2016-07-14 NOTE — Telephone Encounter (Signed)
I have called [name] at the [facility] to make an appointment. Referral has been entered in as Urgent. She has received this referral and will have the physician review the request and call back with an appointment ASAP.

## 2016-07-14 NOTE — H&P (Signed)
[name], MD [facility] [address] [name], [place] [address] Phone: [phone] Fax : [phone]  Primary Care Physician:  Halina Maidens, MD Primary Gastroenterologist:  Dr. Allen Norris  Pre-Procedure History & Physical: HPI:  [name] is a 81 y.o. female is here for an ERCP.   Past Medical History:  Diagnosis Date  . Ankylosing spondylitis ([place])    ? how diagnosed  . Anxiety   . Asthma    as child  . Chronic diastolic CHF (congestive heart failure) ([place])    a. 03/2015 Echo: EF 55-60%, Gr 1 DD.  Marland Kitchen Chronic kidney infection   . Chronic peptic ulcer, unspecified site, without mention of hemorrhage, perforation, or obstruction   . COPD (chronic obstructive pulmonary disease) ([place])   . COPD (chronic obstructive pulmonary disease) ([place])    a.Uses 2l prn - followed by Dr. Raul Del.  . Diabetes mellitus ([place])   . DM (diabetes mellitus) ([place])   . GERD (gastroesophageal reflux disease)   . Headache   . History of kidney stones   . Hypothyroidism    mass, U/S 6/21 - nodules  . IBS (irritable bowel syndrome)   . Insomnia, unspecified   . Left displaced femoral neck fracture ([place])    a. 03/2015 s/p L hemiarthroplasty.  . Lumbar spondylolysis   . Mitral valve prolapse    a. Not noted on 03/2015 Echo.  . Motion sickness    car - back seat  . Neuropathy ([place])   . Osteoarthritis   . PAF (paroxysmal atrial fibrillation) ([place])    a. 03/2015 in setting of hip Fx->Amio/Xarelo;  b. CHA2DS2VASc= 4.  . Palpitations   . Raynaud disease   . Raynaud's disease   . Rheumatic fever   . Systemic lupus erythematosus ([place])   . Uveitis, anterior    ????  . Wears contact lenses   . Wears dentures    full upper, partial lower  . Wheezing     Past Surgical History:  Procedure Laterality Date  . BIOPSY THYROID    . BREAST BIOPSY    . CATARACT EXTRACTION W/PHACO Left 03/23/2015   Procedure: CATARACT EXTRACTION PHACO AND INTRAOCULAR LENS PLACEMENT (IOC);  Surgeon: Birder Robson, MD;  Location: [facility];  Service: Ophthalmology;  Laterality: Left;  [place] 00:51   . CATARACT EXTRACTION W/PHACO Right 05/25/2015   Procedure: CATARACT EXTRACTION PHACO AND INTRAOCULAR LENS PLACEMENT (IOC);  Surgeon: Birder Robson, MD;  Location: [facility];  Service: Ophthalmology;  Laterality: Right;  [place] 00:57   . COLONOSCOPY  multiple  . ESOPHAGOGASTRODUODENOSCOPY  multiple  . ESOPHAGOGASTRODUODENOSCOPY (EGD) WITH PROPOFOL N/A 12/11/2014   Procedure: ESOPHAGOGASTRODUODENOSCOPY (EGD) WITH PROPOFOL;  Surgeon: [name], MD;  Location: [facility];  Service: Endoscopy;  Laterality: N/A;  cytology brushing  . HIP ARTHROPLASTY Left 03/26/2015   Procedure: ARTHROPLASTY BIPOLAR HIP (HEMIARTHROPLASTY);  Surgeon: Earnestine Leys, MD;  Location: [facility];  Service: Orthopedics;  Laterality: Left;  . HIP ARTHROPLASTY Right 11/06/2015   Procedure: ARTHROPLASTY BIPOLAR HIP (HEMIARTHROPLASTY);  Surgeon: Thornton Park, MD;  Location: [facility];  Service: Orthopedics;  Laterality: Right;  . JOINT REPLACEMENT Left   . PERIPHERAL VASCULAR CATHETERIZATION N/A 12/27/2015   Procedure: Lower Extremity Angiography;  Surgeon: Algernon Huxley, MD;  Location: [facility];  Service: Cardiovascular;  Laterality: N/A;  . PERIPHERAL VASCULAR CATHETERIZATION  12/27/2015   Procedure: Lower Extremity Intervention;  Surgeon: Algernon Huxley, MD;  Location: [facility];  Service: Cardiovascular;;  . TOTAL  ABDOMINAL HYSTERECTOMY W/ BILATERAL SALPINGOOPHORECTOMY  1990    Prior to Admission medications   Medication Sig Start Date End Date Taking? Authorizing Provider  albuterol (PROVENTIL) (2.5 MG/3ML) 0.083% nebulizer solution Take 2.5 mg by nebulization every 6 (six) hours as needed for wheezing or shortness of breath. Reported on 07/09/2015   Yes Historical Provider, MD  amiodarone (PACERONE) 200 MG tablet Take 1 tablet (200 mg total) by mouth daily. 02/04/16  Yes Glean Hess, MD  docusate sodium  (COLACE) 100 MG capsule Take 1 capsule (100 mg total) by mouth 2 (two) times daily. 11/10/15  Yes Srikar Sudini, MD  feeding supplement, ENSURE ENLIVE, (ENSURE ENLIVE) LIQD Take 237 mLs by mouth 2 (two) times daily between meals. 11/10/15  Yes Srikar Sudini, MD  ferrous sulfate 325 (65 FE) MG tablet Take 1 tablet (325 mg total) by mouth 2 (two) times daily with a meal. 11/10/15  Yes Srikar Sudini, MD  glimepiride (AMARYL) 2 MG tablet Take 1 tablet (2 mg total) by mouth daily before breakfast. 04/04/16  Yes Glean Hess, MD  JANUVIA 100 MG tablet Take 0.5 tablets (50 mg total) by mouth daily. 02/04/16  Yes Glean Hess, MD  LORazepam (ATIVAN) 1 MG tablet Take 0.5 tablets (0.5 mg total) by mouth at bedtime as needed for anxiety. 07/03/16  Yes Glean Hess, MD  metoprolol tartrate (LOPRESSOR) 25 MG tablet Take 1 tablet (25 mg total) by mouth 2 (two) times daily. 02/15/16  Yes Glean Hess, MD  mometasone-formoterol [facility]) 100-5 MCG/ACT AERO Inhale 2 puffs into the lungs 2 (two) times daily. 07/16/15  Yes Glean Hess, MD  oxyCODONE (OXY IR/ROXICODONE) 5 MG immediate release tablet Take 1 tablet (5 mg total) by mouth every 4 (four) hours as needed for severe pain ((for MODERATE breakthrough pain)). 11/10/15  Yes Srikar Sudini, MD  Probiotic Product (ALIGN) 4 MG CAPS Take 1 capsule (4 mg total) by mouth daily. 06/22/15  Yes Glean Hess, MD  ranitidine (ZANTAC) 150 MG tablet Take 1 tablet (150 mg total) by mouth 2 (two) times daily. 02/03/16  Yes Glean Hess, MD  senna (SENOKOT) 8.6 MG TABS tablet Take 1 tablet by mouth 2 (two) times daily.   Yes Historical Provider, MD  tiotropium (SPIRIVA) 18 MCG inhalation capsule Place 1 capsule (18 mcg total) into inhaler and inhale daily. 02/22/16  Yes Glean Hess, MD    Allergies as of 07/13/2016 - Review Complete 07/13/2016  Allergen Reaction Noted  . Codeine Nausea And Vomiting 12/09/2014  . Penicillins Hives 04/18/2013  . Sulfa  antibiotics  04/18/2013  . Tetracyclines & related Photosensitivity 12/12/2013  . Xarelto [rivaroxaban]  02/21/2016  . Doxycycline Rash 03/23/2015  . Erythromycin Rash 04/18/2013  . Latex Rash 04/18/2013    Family History  Problem Relation Age of Onset  . Heart disease Mother   . Hypothyroidism Mother   . Diabetes Mother   . Heart attack Mother   . Hypertension Mother   . Heart disease Father   . Heart attack Father   . Breast cancer Sister   . Hypothyroidism Sister   . Diabetes Sister   . Ovarian cancer Sister   . Diabetes Brother   . Prostate cancer Brother   . Hypothyroidism Daughter   . Kidney Stones Brother   . Kidney disease Neg Hx   . Bladder Cancer Neg Hx     Social History   Social History  . Marital status: Married  Spouse name: N/A  . Number of children: 4  . Years of education: N/A   Occupational History  . retired    Social History Main Topics  . Smoking status: Former Smoker    Packs/day: 0.25    Years: 40.00    Types: Cigarettes    Quit date: 10/11/2011  . Smokeless tobacco: Never Used     Comment: Quit 3 years  . Alcohol use No  . Drug use: No  . Sexual activity: Not on file   Other Topics Concern  . Not on file   Social History Narrative   Married (#2)   Moved from [place] in Summer 2014 - Lives in [place]   1 son, 3 daughters (one lives in this area)   2 caffeinated beverages daily   04/18/2013 update    Review of Systems: See HPI, otherwise negative ROS  Physical Exam: BP 98/79   Pulse 79   Temp (!) 96.9 F (36.1 C) (Tympanic)   Resp 20   SpO2 96%  General:   Alert,  pleasant and cooperative in NAD Head:  Normocephalic and atraumatic. Neck:  Supple; no masses or thyromegaly. Lungs:  Clear throughout to auscultation.    Heart:  Regular rate and rhythm. Abdomen:  Soft, nontender and nondistended. Normal bowel sounds, without guarding, and without rebound.   Neurologic:  Alert and  oriented x4;  grossly normal  neurologically.  Impression/Plan: Abbigale Woulard is here for an ERCP to be performed for pancreatic head mass  Risks, benefits, limitations, and alternatives regarding  ERCP have been reviewed with the patient.  Questions have been answered.  All parties agreeable.   [name], MD  07/14/2016, 11:54 AM

## 2016-07-14 NOTE — Transfer of Care (Signed)
    Immediate Anesthesia Transfer of Care Note  Patient: [name]  Procedure(s) Performed: Procedure(s): ENDOSCOPIC RETROGRADE CHOLANGIOPANCREATOGRAPHY (ERCP) (N/A)  Patient Location: PACU  Anesthesia Type:General  Level of Consciousness: awake and sedated  Airway & Oxygen Therapy: Patient Spontanous Breathing and Patient connected to nasal cannula oxygen  Post-op Assessment: Report given to RN and Post -op Vital signs reviewed and stable  Post vital signs: Reviewed and stable  Last Vitals:  Vitals:   07/14/16 1127  BP: 98/79  Pulse: 79  Resp: 20  Temp: (!) 36.1 C    Last Pain:  Vitals:   07/14/16 1127  TempSrc: Tympanic         Complications: No apparent anesthesia complications

## 2016-07-14 NOTE — Anesthesia Procedure Notes (Signed)
Date/Time: 07/14/2016 12:15 PM Performed by: Vaughan Sine Pre-anesthesia Checklist: Patient identified, Emergency Drugs available, Suction available, Patient being monitored and Timeout performed Oxygen Delivery Method: Nasal cannula

## 2016-07-14 NOTE — Op Note (Signed)
[facility] Gastroenterology Patient Name: [name] Procedure Date: 07/14/2016 11:31 AM MRN: [id number] Account #: [id number] Date of Birth: June 14, 1935 Admit Type: Outpatient Age: 81 Room: [facility] ENDO ROOM 4 Gender: Female Note Status: Finalized Procedure:            ERCP Indications:          Tumor of the head of pancreas Providers:            [name] MD, MD Referring MD:         Halina Maidens, MD (Referring MD) Medicines:            Propofol per Anesthesia Complications:        No immediate complications. Procedure:            Pre-Anesthesia Assessment:                       - Prior to the procedure, a History and Physical was                        performed, and patient medications and allergies were                        reviewed. The patient's tolerance of previous                        anesthesia was also reviewed. The risks and benefits of                        the procedure and the sedation options and risks were                        discussed with the patient. All questions were                        answered, and informed consent was obtained. Prior                        Anticoagulants: The patient has taken no previous                        anticoagulant or antiplatelet agents. ASA Grade                        Assessment: III - A patient with severe systemic                        disease. After reviewing the risks and benefits, the                        patient was deemed in satisfactory condition to undergo                        the procedure.                       After obtaining informed consent, the scope was passed                        under direct vision. Throughout the procedure, the  patient's blood pressure, pulse, and oxygen saturations                        were monitored continuously. The ERCP was introduced                        through the mouth, and used to inject contrast into and                         used to inject contrast into the bile duct. The ERCP                        was accomplished without difficulty. The patient                        tolerated the procedure well. Findings:      The scout film was normal. The esophagus was successfully intubated       under direct vision. The scope was advanced to a normal major papilla in       the descending duodenum without detailed examination of the pharynx,       larynx and associated structures, and upper GI tract. The upper GI tract       was grossly normal. The bile duct was deeply cannulated with the       short-nosed traction sphincterotome. Contrast was injected. I personally       interpreted the bile duct images. There was brisk flow of contrast       through the ducts. Image quality was excellent. Contrast extended to the       entire biliary tree. The upper third of the main bile duct and main bile       duct were markedly dilated, acquired. A wire was passed into the ventral       pancreatic duct. A wire was passed into the biliary tree. Biliary       sphincterotomy was made with a traction (standard) sphincterotome using       ERBE electrocautery. There was no post-sphincterotomy bleeding. One 7 Fr       by 7 cm temporary stent with a single external flap and a single       internal flap was placed 5 cm into the common bile duct. Bile flowed       through the stent. The stent was in good position. Impression:           - The entire main bile duct and upper third of the main                        bile duct were markedly dilated, acquired.                       - A biliary sphincterotomy was performed.                       - One temporary stent was placed into the common bile                        duct.                       - Bile suct stricture. Recommendation:       -  Watch for pancreatitis, bleeding, perforation, and                        cholangitis.                       - Clear liquid diet  today. Procedure Code(s):    --- Professional ---                       [phone], Endoscopic retrograde cholangiopancreatography                        (ERCP); with placement of endoscopic stent into biliary                        or pancreatic duct, including pre- and post-dilation                        and guide wire passage, when performed, including                        sphincterotomy, when performed, each stent                       [id number], Endoscopic catheterization of the biliary ductal                        system, radiological supervision and interpretation Diagnosis Code(s):    --- Professional ---                       K83.8, Other specified diseases of biliary tract                       D49.0, Neoplasm of unspecified behavior of digestive                        system CPT copyright 2016 American Medical Association. All rights reserved. The codes documented in this report are preliminary and upon coder review may  be revised to meet current compliance requirements. [name] MD, MD 07/14/2016 1:02:01 PM This report has been signed electronically. Number of Addenda: 0 Note Initiated On: 07/14/2016 11:31 AM      [facility]

## 2016-07-14 NOTE — Anesthesia Preprocedure Evaluation (Signed)
Anesthesia Evaluation  Patient identified by MRN, date of birth, ID band Patient awake    Reviewed: Allergy & Precautions, NPO status , Patient's Chart, lab work & pertinent test results  History of Anesthesia Complications Negative for: history of anesthetic complications  Airway Mallampati: II  TM Distance: >3 FB Neck ROM: Full    Dental  (+) Upper Dentures, Partial Lower   Pulmonary asthma , COPD,  COPD inhaler, former smoker,    breath sounds clear to auscultation- rhonchi (-) wheezing      Cardiovascular (-) hypertension+CHF (preserved EF)  (-) CAD and (-) Past MI  Rhythm:Regular Rate:Normal - Systolic murmurs and - Diastolic murmurs    Neuro/Psych  Headaches, PSYCHIATRIC DISORDERS Anxiety    GI/Hepatic PUD, GERD  Controlled,Biliary obstruction from pancreatic mass    Endo/Other  diabetes, Oral Hypoglycemic AgentsHypothyroidism   Renal/GU Renal disease: hx of nephrolithiasis.     Musculoskeletal  (+) Arthritis ,   Abdominal (+) - obese,   Peds  Hematology negative hematology ROS (+)   Anesthesia Other Findings   Reproductive/Obstetrics                             Anesthesia Physical Anesthesia Plan  ASA: III  Anesthesia Plan: General   Post-op Pain Management:    Induction: Intravenous  Airway Management Planned: Natural Airway  Additional Equipment:   Intra-op Plan:   Post-operative Plan:   Informed Consent: I have reviewed the patients History and Physical, chart, labs and discussed the procedure including the risks, benefits and alternatives for the proposed anesthesia with the patient or authorized representative who has indicated his/her understanding and acceptance.   Dental advisory given  Plan Discussed with: CRNA and Anesthesiologist  Anesthesia Plan Comments:         Anesthesia Quick Evaluation

## 2016-07-17 ENCOUNTER — Inpatient Hospital Stay: Payer: Medicare Other | Attending: Oncology | Admitting: Oncology

## 2016-07-17 ENCOUNTER — Encounter: Payer: Self-pay | Admitting: Gastroenterology

## 2016-07-17 VITALS — BP 112/65 | HR 80 | Temp 98.4°F | Resp 18 | Wt 115.0 lb

## 2016-07-17 DIAGNOSIS — Z7901 Long term (current) use of anticoagulants: Secondary | ICD-10-CM

## 2016-07-17 DIAGNOSIS — N181 Chronic kidney disease, stage 1: Secondary | ICD-10-CM

## 2016-07-17 DIAGNOSIS — R112 Nausea with vomiting, unspecified: Secondary | ICD-10-CM

## 2016-07-17 DIAGNOSIS — I341 Nonrheumatic mitral (valve) prolapse: Secondary | ICD-10-CM | POA: Diagnosis not present

## 2016-07-17 DIAGNOSIS — F0150 Vascular dementia without behavioral disturbance: Secondary | ICD-10-CM

## 2016-07-17 DIAGNOSIS — R5383 Other fatigue: Secondary | ICD-10-CM | POA: Diagnosis not present

## 2016-07-17 DIAGNOSIS — I7300 Raynaud's syndrome without gangrene: Secondary | ICD-10-CM

## 2016-07-17 DIAGNOSIS — J449 Chronic obstructive pulmonary disease, unspecified: Secondary | ICD-10-CM | POA: Diagnosis not present

## 2016-07-17 DIAGNOSIS — R634 Abnormal weight loss: Secondary | ICD-10-CM | POA: Diagnosis not present

## 2016-07-17 DIAGNOSIS — Z66 Do not resuscitate: Secondary | ICD-10-CM | POA: Diagnosis not present

## 2016-07-17 DIAGNOSIS — Z87442 Personal history of urinary calculi: Secondary | ICD-10-CM

## 2016-07-17 DIAGNOSIS — M329 Systemic lupus erythematosus, unspecified: Secondary | ICD-10-CM

## 2016-07-17 DIAGNOSIS — I5042 Chronic combined systolic (congestive) and diastolic (congestive) heart failure: Secondary | ICD-10-CM | POA: Diagnosis not present

## 2016-07-17 DIAGNOSIS — R1011 Right upper quadrant pain: Secondary | ICD-10-CM | POA: Diagnosis not present

## 2016-07-17 DIAGNOSIS — Z7984 Long term (current) use of oral hypoglycemic drugs: Secondary | ICD-10-CM

## 2016-07-17 DIAGNOSIS — R5381 Other malaise: Secondary | ICD-10-CM | POA: Diagnosis not present

## 2016-07-17 DIAGNOSIS — Z79899 Other long term (current) drug therapy: Secondary | ICD-10-CM

## 2016-07-17 DIAGNOSIS — G893 Neoplasm related pain (acute) (chronic): Secondary | ICD-10-CM | POA: Diagnosis not present

## 2016-07-17 DIAGNOSIS — K219 Gastro-esophageal reflux disease without esophagitis: Secondary | ICD-10-CM

## 2016-07-17 DIAGNOSIS — C250 Malignant neoplasm of head of pancreas: Secondary | ICD-10-CM | POA: Diagnosis not present

## 2016-07-17 DIAGNOSIS — C787 Secondary malignant neoplasm of liver and intrahepatic bile duct: Secondary | ICD-10-CM

## 2016-07-17 DIAGNOSIS — Z7189 Other specified counseling: Secondary | ICD-10-CM

## 2016-07-17 DIAGNOSIS — E1122 Type 2 diabetes mellitus with diabetic chronic kidney disease: Secondary | ICD-10-CM

## 2016-07-17 DIAGNOSIS — C259 Malignant neoplasm of pancreas, unspecified: Secondary | ICD-10-CM

## 2016-07-17 DIAGNOSIS — E039 Hypothyroidism, unspecified: Secondary | ICD-10-CM | POA: Diagnosis not present

## 2016-07-17 DIAGNOSIS — Z96643 Presence of artificial hip joint, bilateral: Secondary | ICD-10-CM

## 2016-07-17 DIAGNOSIS — Z87891 Personal history of nicotine dependence: Secondary | ICD-10-CM

## 2016-07-17 DIAGNOSIS — D039 Melanoma in situ, unspecified: Secondary | ICD-10-CM

## 2016-07-17 MED ORDER — ONDANSETRON HCL 4 MG PO TABS: 4.0000 mg | ORAL_TABLET | Freq: Four times a day (QID) | ORAL | 1 refills | Status: AC | PRN

## 2016-07-17 MED ORDER — MORPHINE SULFATE 15 MG PO TABS: 7.5000 mg | ORAL_TABLET | ORAL | 0 refills | Status: AC | PRN

## 2016-07-17 NOTE — Progress Notes (Signed)
Hematology/Oncology Consult note [facility] [phone] [phone]  Patient Care Team: Glean Hess, MD as PCP - General (Family Medicine) Judi Cong, MD as Physician Assistant (Endocrinology) Nori Riis, PA-C as Physician Assistant (Urology) Anabel Bene, MD as Referring Physician (Neurology) Erby Pian, MD as Referring Physician (Specialist) Wellington Hampshire, MD as Consulting Physician (Cardiology) Judene Companion, MD as Consulting Physician (General Surgery)   Name of the patient: [name]  CE:[id number]  [birth year]    Reason for referral- pancreatic mass   Referring physician- Dr. Adonis Huguenin  Date of visit: 07/17/16   History of presenting illness- 1. patient is a 81 year old female with a past medical history significant for that as systolic heart failure, atrial fibrillation on anticoagulation and dementia. Patient lives with her husband and has had bilateral hip replacement and has some difficulty in mobility. She also has dementia but is fairly functional. She was complain of right upper quadrant abdominal pain for the last few months for which ultrasound was obtained.  2. Ultrasound showed dilated common bile duct of 10 mm. Gallbladder showed sludge. Hypoechoic foci were noted in the right and left hepatic lobe which were nonspecific but do not reflect simple cysts.   3. CT of the abdomen with and without contrast on 07/11/2016 showed several small new enhancing low attenuation masses in the right and left hepatic lobe. Index lesion in the left lobe measured 1.8 x 1.4 cm and that in the right lobe measured 1.9 x 1.9 cm consistent with liver metastases. Diffuse pancreatic ductal dilation is seen. A low-attenuation masses seen in the pancreatic head measuring 2.9 x 1.7 cm highly suspicious for pancreatic adenocarcinoma.  4. Prior CT thorax without contrast in September 2017 revealed moderate centrilobular emphysema  but no evidence of metastatic disease  5. CA-19-9 is elevated at 147. CBC was essentially within normal limits. CMP showed elevated AST and ALT of 119 and 206 respectively. Total bilirubin was elevated at 7.6 out of which 5.2 was direct and 2.1 was indirect.  6. Patient underwent ERCP with stent placement on 07/14/2016. Findings : The entire main bile duct and upper third of the main bile duct were markedly dilated. A biliary sphincterotomy was performed. One temporary stent was placed in the common bile duct. Bile duct stricture. No biopsy was performed  Patient is discussed the results of the scans and further management. Patient had bilateral hip replacement last year following which she had a prolonged recovery. Until about 2-3 weeks that patient was ambulating but since then she has felt weaker and feels as if her legs give way when she tries to stand and walk and hence has been mainly using her wheelchair. She also reports epigastric pain and has not been using pain medications. Also reports on and off nausea. Appetite is fair. She has had about a 5 pound weight loss in the last couple of weeks  ECOG PS- 2-3  Pain scale- 3   Review of systems- Review of Systems  Constitutional: Positive for malaise/fatigue and weight loss. Negative for chills and fever.  HENT: Negative for congestion, ear discharge and nosebleeds.   Eyes: Negative for blurred vision.  Respiratory: Negative for cough, hemoptysis, sputum production, shortness of breath and wheezing.   Cardiovascular: Negative for chest pain, palpitations, orthopnea and claudication.  Gastrointestinal: Positive for abdominal pain, nausea and vomiting. Negative for blood in stool, constipation, diarrhea, heartburn and melena.  Genitourinary: Negative for dysuria, flank pain, frequency, hematuria and  urgency.  Musculoskeletal: Negative for back pain, joint pain and myalgias.  Skin: Negative for rash.  Neurological: Negative for dizziness,  tingling, focal weakness, seizures, weakness and headaches.  Endo/Heme/Allergies: Does not bruise/bleed easily.  Psychiatric/Behavioral: Negative for depression and suicidal ideas. The patient does not have insomnia.     Allergies  Allergen Reactions  . Codeine Nausea And Vomiting  . Penicillins Hives    Has patient had a PCN reaction causing immediate rash, facial/tongue/throat swelling, SOB or lightheadedness with hypotension: no  Has patient had a PCN reaction causing severe rash involving mucus membranes or skin necrosis: no  Has patient had a PCN reaction that required hospitalization: no  Has patient had a PCN reaction occurring within the last 10 years: no  If all of the above answers are "NO", then may proceed with Cephalosporin use.   . Sulfa Antibiotics   . Tetracyclines & Related Photosensitivity  . Xarelto [Rivaroxaban]   . Doxycycline Rash  . Erythromycin Rash  . Latex Rash    Patient Active Problem List   Diagnosis Date Noted  . Other specified diseases of biliary tract (CODE)   . Neoplasm of digestive system   . Gall bladder disease 07/04/2016  . DNR (do not resuscitate) 12/29/2015  . Palliative care encounter 12/29/2015  . Dementia   . Demand ischemia ([place]) 12/23/2015  . Metabolic encephalopathy [id number]  . Closed right hip fracture ([place]) 11/04/2015  . Chronic respiratory failure with hypoxia ([place]) 10/27/2015  . Mixed Alzheimer's and vascular dementia 07/29/2015  . Type 2 diabetes mellitus with stage 1 chronic kidney disease, without long-term current use of insulin ([place]) 07/16/2015  . Tremor observed on examination 07/16/2015  . Urinary retention 07/12/2015  . Stage III pressure ulcer of right heel ([place]) 07/12/2015  . Malnutrition of moderate degree 07/03/2015  . 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 Paroxysmal atrial fibrillation ([place]) 06/25/2015  . Chronic diastolic CHF (congestive heart failure) ([place])   . Left displaced femoral neck fracture ([place])     . GERD (gastroesophageal reflux disease) 03/26/2015  . Anxiety 03/26/2015  . Gastroduodenal ulcer 01/05/2015  . Difficulty swallowing solids   . Esophageal candidiasis ([place])   . Acquired hypothyroidism 08/18/2014  . Systemic lupus erythematosus ([place]) 04/03/2014     Past Medical History:  Diagnosis Date  . Ankylosing spondylitis ([place])    ? how diagnosed  . Anxiety   . Asthma    as child  . Chronic diastolic CHF (congestive heart failure) ([place])    a. 03/2015 Echo: EF 55-60%, Gr 1 DD.  Marland Kitchen Chronic kidney infection   . Chronic peptic ulcer, unspecified site, without mention of hemorrhage, perforation, or obstruction   . COPD (chronic obstructive pulmonary disease) ([place])   . COPD (chronic obstructive pulmonary disease) ([place])    a.Uses 2l prn - followed by Dr. Raul Del.  . Diabetes mellitus ([place])   . DM (diabetes mellitus) ([place])   . GERD (gastroesophageal reflux disease)   . Headache   . History of kidney stones   . Hypothyroidism    mass, U/S 6/21 - nodules  . IBS (irritable bowel syndrome)   . Insomnia, unspecified   . Left displaced femoral neck fracture ([place])    a. 03/2015 s/p L hemiarthroplasty.  . Lumbar spondylolysis   . Mitral valve prolapse    a. Not noted on 03/2015 Echo.  . Motion sickness    car - back seat  . Neuropathy ([place])   . Osteoarthritis   . PAF (  paroxysmal atrial fibrillation) ([place])    a. 03/2015 in setting of hip Fx->Amio/Xarelo;  b. CHA2DS2VASc= 4.  . Palpitations   . Raynaud disease   . Raynaud's disease   . Rheumatic fever   . Systemic lupus erythematosus ([place])   . Uveitis, anterior    ????  . Wears contact lenses   . Wears dentures    full upper, partial lower  . Wheezing      Past Surgical History:  Procedure Laterality Date  . BIOPSY THYROID    . BREAST BIOPSY    . CATARACT EXTRACTION W/PHACO Left 03/23/2015   Procedure: CATARACT EXTRACTION PHACO AND INTRAOCULAR LENS PLACEMENT (IOC);  Surgeon: Birder Robson, MD;  Location: [facility]  ORS;  Service: Ophthalmology;  Laterality: Left;  [place] 00:51   . CATARACT EXTRACTION W/PHACO Right 05/25/2015   Procedure: CATARACT EXTRACTION PHACO AND INTRAOCULAR LENS PLACEMENT (IOC);  Surgeon: Birder Robson, MD;  Location: [facility] ORS;  Service: Ophthalmology;  Laterality: Right;  [place] 00:57   . COLONOSCOPY  multiple  . ESOPHAGOGASTRODUODENOSCOPY  multiple  . ESOPHAGOGASTRODUODENOSCOPY (EGD) WITH PROPOFOL N/A 12/11/2014   Procedure: ESOPHAGOGASTRODUODENOSCOPY (EGD) WITH PROPOFOL;  Surgeon: [name], MD;  Location: [facility];  Service: Endoscopy;  Laterality: N/A;  cytology brushing  . HIP ARTHROPLASTY Left 03/26/2015   Procedure: ARTHROPLASTY BIPOLAR HIP (HEMIARTHROPLASTY);  Surgeon: Earnestine Leys, MD;  Location: [facility] ORS;  Service: Orthopedics;  Laterality: Left;  . HIP ARTHROPLASTY Right 11/06/2015   Procedure: ARTHROPLASTY BIPOLAR HIP (HEMIARTHROPLASTY);  Surgeon: Thornton Park, MD;  Location: [facility] ORS;  Service: Orthopedics;  Laterality: Right;  . JOINT REPLACEMENT Left   . PERIPHERAL VASCULAR CATHETERIZATION N/A 12/27/2015   Procedure: Lower Extremity Angiography;  Surgeon: Algernon Huxley, MD;  Location: [facility];  Service: Cardiovascular;  Laterality: N/A;  . PERIPHERAL VASCULAR CATHETERIZATION  12/27/2015   Procedure: Lower Extremity Intervention;  Surgeon: Algernon Huxley, MD;  Location: [facility];  Service: Cardiovascular;;  . TOTAL ABDOMINAL HYSTERECTOMY W/ BILATERAL SALPINGOOPHORECTOMY  1990    Social History   Social History  . Marital status: Married    Spouse name: N/A  . Number of children: 4  . Years of education: N/A   Occupational History  . retired    Social History Main Topics  . Smoking status: Former Smoker    Packs/day: 0.25    Years: 40.00    Types: Cigarettes    Quit date: 10/11/2011  . Smokeless tobacco: Never Used     Comment: Quit 3 years  . Alcohol use No  . Drug use: No  . Sexual activity: Not on file   Other Topics  Concern  . Not on file   Social History Narrative   Married (#2)   Moved from [place] in Summer 2014 - Lives in [place]   1 son, 3 daughters (one lives in this area)   2 caffeinated beverages daily   04/18/2013 update     Family History  Problem Relation Age of Onset  . Heart disease Mother   . Hypothyroidism Mother   . Diabetes Mother   . Heart attack Mother   . Hypertension Mother   . Heart disease Father   . Heart attack Father   . Breast cancer Sister   . Hypothyroidism Sister   . Diabetes Sister   . Ovarian cancer Sister   . Diabetes Brother   . Prostate cancer Brother   . Hypothyroidism Daughter   . Kidney Stones Brother   .  Kidney disease Neg Hx   . Bladder Cancer Neg Hx      Current Outpatient Prescriptions:  .  albuterol (PROVENTIL) (2.5 MG/3ML) 0.083% nebulizer solution, Take 2.5 mg by nebulization every 6 (six) hours as needed for wheezing or shortness of breath. Reported on 07/09/2015, Disp: , Rfl:  .  amiodarone (PACERONE) 200 MG tablet, Take 1 tablet (200 mg total) by mouth daily., Disp: 90 tablet, Rfl: 0 .  docusate sodium (COLACE) 100 MG capsule, Take 1 capsule (100 mg total) by mouth 2 (two) times daily., Disp: 10 capsule, Rfl: 0 .  feeding supplement, ENSURE ENLIVE, (ENSURE ENLIVE) LIQD, Take 237 mLs by mouth 2 (two) times daily between meals., Disp: 237 mL, Rfl: 12 .  glimepiride (AMARYL) 2 MG tablet, Take 1 tablet (2 mg total) by mouth daily before breakfast., Disp: 30 tablet, Rfl: 5 .  JANUVIA 100 MG tablet, Take 0.5 tablets (50 mg total) by mouth daily. (Patient taking differently: Take 100 mg by mouth daily. ), Disp: 90 tablet, Rfl: 0 .  LORazepam (ATIVAN) 1 MG tablet, Take 0.5 tablets (0.5 mg total) by mouth at bedtime as needed for anxiety., Disp: 30 tablet, Rfl: 0 .  metoprolol tartrate (LOPRESSOR) 25 MG tablet, Take 1 tablet (25 mg total) by mouth 2 (two) times daily., Disp: 180 tablet, Rfl: 1 .  mometasone-formoterol (DULERA) 100-5 MCG/ACT AERO,  Inhale 2 puffs into the lungs 2 (two) times daily., Disp: 13 g, Rfl: 5 .  ranitidine (ZANTAC) 150 MG tablet, Take 1 tablet (150 mg total) by mouth 2 (two) times daily., Disp: 180 tablet, Rfl: 1 .  senna (SENOKOT) 8.6 MG TABS tablet, Take 1 tablet by mouth 2 (two) times daily., Disp: , Rfl:  .  tiotropium (SPIRIVA) 18 MCG inhalation capsule, Place 1 capsule (18 mcg total) into inhaler and inhale daily., Disp: 90 capsule, Rfl: 1 .  ferrous sulfate 325 (65 FE) MG tablet, Take 1 tablet (325 mg total) by mouth 2 (two) times daily with a meal. (Patient not taking: Reported on 07/17/2016), Disp: , Rfl: 3 .  morphine (MSIR) 15 MG tablet, Take 0.5 tablets (7.5 mg total) by mouth every 4 (four) hours as needed for severe pain., Disp: 30 tablet, Rfl: 0 .  ondansetron (ZOFRAN) 4 MG tablet, Take 1 tablet (4 mg total) by mouth every 6 (six) hours as needed for nausea or vomiting., Disp: 30 tablet, Rfl: 1 .  oxyCODONE (OXY IR/ROXICODONE) 5 MG immediate release tablet, Take 1 tablet (5 mg total) by mouth every 4 (four) hours as needed for severe pain ((for MODERATE breakthrough pain)). (Patient not taking: Reported on 07/17/2016), Disp: 30 tablet, Rfl: 0 .  Probiotic Product (ALIGN) 4 MG CAPS, Take 1 capsule (4 mg total) by mouth daily. (Patient not taking: Reported on 07/17/2016), Disp: 90 capsule, Rfl: 1   Physical exam:  Vitals:   07/17/16 0939  BP: 112/65  Pulse: 80  Resp: 18  Temp: 98.4 F (36.9 C)  TempSrc: Tympanic  SpO2: 97%  Weight: 115 lb (52.2 kg)   Physical Exam  Constitutional: She is oriented to person, place, and time.  Patient is frail-appearing, sitting in a wheelchair but appears in no acute distress  HENT:  Head: Normocephalic and atraumatic.  Eyes: EOM are normal. Pupils are equal, round, and reactive to light.  Sclerae icteric  Neck: Normal range of motion.  Cardiovascular: Normal rate, regular rhythm and normal heart sounds.   Pulmonary/Chest: Effort normal and breath sounds normal.    Abdominal:  Soft. Bowel sounds are normal.  TTP in the epigastrium  Neurological: She is alert and oriented to person, place, and time.  Skin: Skin is warm and dry.       CMP Latest Ref Rng & Units 07/10/2016  Glucose 65 - 99 mg/dL -  BUN 6 - 20 mg/dL -  Creatinine 0.44 - 1.00 mg/dL -  Sodium 135 - 145 mmol/L -  Potassium 3.5 - 5.1 mmol/L -  Chloride 101 - 111 mmol/L -  CO2 22 - 32 mmol/L -  Calcium 8.9 - 10.3 mg/dL -  Total Protein 6.5 - 8.1 g/dL -  Total Bilirubin 0.3 - 1.2 mg/dL 7.3(H)  Alkaline Phos 38 - 126 U/L -  AST 15 - 41 U/L -  ALT 14 - 54 U/L -   CBC Latest Ref Rng & Units 07/10/2016  WBC 3.6 - 11.0 K/uL 5.6  Hemoglobin 12.0 - 16.0 g/dL 11.9(L)  Hematocrit 35.0 - 47.0 % 35.1  Platelets 150 - 440 K/uL 195    No images are attached to the encounter.  Ct Pancreas Abdomen W Wo Contrast  Result Date: 07/11/2016 CLINICAL DATA:  Right upper quadrant pain. Biliary ductal dilatation and hepatic masses on recent ultrasound. EXAM: CT ABDOMEN WITHOUT AND WITH CONTRAST TECHNIQUE: Multidetector CT imaging of the abdomen was performed following the standard protocol before and following the bolus administration of intravenous contrast. CONTRAST:  100 mL Isovue 370 COMPARISON:  Ultrasound 07/04/2016 and CT 01/15/2016 FINDINGS: Lower chest: No acute findings.  Mild bibasilar scarring. Hepatobiliary: Several small new enhancing low-attenuation masses are seen right and left hepatic lobes. Index lesion in segment 2 of the left lobe measures 1.8 x 1.4 cm on image 18/4. Index lesion in the anterior segment of right lobe measures 1.9 x 1.9 cm on image 29/4. These are consistent with liver metastases. Marked diffuse biliary ductal dilatation is seen as well as distended gallbladder. No evidence of acute cholecystitis. Stricture of distal common bile duct is seen in the pancreatic head. Pancreas: Diffuse pancreatic ductal dilatation is seen. A low-attenuation mass is seen in the pancreatic head  measuring 2.9 x 1.7 cm on image 46/for, highly suspicious for pancreatic adenocarcinoma. This mass shows no evidence of arterial or venous vascular involvement. Spleen:  Within normal limits in size and appearance. Adrenals/Urinary Tract: Diffuse thickening of left adrenal gland is seen without evidence of discrete mass. This is most likely due to mild adrenal hyperplasia. No renal masses identified. Several tiny less than 5 mm calculi are seen in upper pole of right kidney. No evidence of hydronephrosis. Stomach/Bowel: Visualized portions within the abdomen are unremarkable. Vascular/Lymphatic: No pathologically enlarged lymph nodes identified. No abdominal aortic aneurysm. Aortic atherosclerosis. Other:  None. Musculoskeletal:  No suspicious bone lesions identified. IMPRESSION: 2.9 cm low-attenuation mass in pancreatic head, highly suspicious for pancreatic carcinoma. This causes diffuse biliary and pancreatic ductal dilatation, but shows no evidence of vascular involvement. Several small liver metastases in right and left hepatic lobes. No other sites of metastatic disease identified within the abdomen. Incidental findings including non obstructing right renal calculi and aortic atherosclerosis. Electronically Signed   By: Earle Gell M.D.   On: 07/11/2016 10:49   US Abdomen Limited Ruq  Result Date: 07/04/2016 CLINICAL DATA:  Right upper quadrant abdominal pain for the past month EXAM: US ABDOMEN LIMITED - RIGHT UPPER QUADRANT COMPARISON:  Noncontrast abdominal CT scan of January 15, 2016 FINDINGS: Gallbladder: Gallbladder appears mildly distended and is filled with echogenic material. Doppler interrogation  reveals some Doppler signal intraluminal length. No discrete stones are observed. There is no positive sonographic Murphy's sign. The gallbladder wall is not clearly thickened. Common bile duct: Diameter: 10 mm. There are intraluminal echoes within the dilated common bile duct consistent with sludge. Liver:  The hepatic echotexture is heterogeneous. There is mild intrahepatic ductal dilation. There are hypoechoic foci within the liver. Two in the right lobe measure 1.4 x 1.0 x 1.5 cm and 0.9 x 0.9 x 0.6 cm. A hypoechoic focus in the left lobe measures 0.8 x 0.8 x 1.0 cm. IMPRESSION: The gallbladder is filled with echogenic material which is likely sludge, as is the mildly dilated common bile duct. No discrete stones are observed. No positive sonographic Murphy's sign. Dilated common bile duct and intrahepatic ducts likely reflect the presence of partially obstructing sludge or stones in the distal common bile duct. Hypoechoic foci within the right and left hepatic lobes are nonspecific but do not reflect simple cysts. Hepatic protocol MRI is recommended. Electronically Signed   By: David  Martinique M.D.   On: 07/04/2016 11:43    Assessment and plan- Patient is a 81 y.o. female with a history of pancreatic head mass and liver metastases likely metastatic pancreatic cancer given the elevated CA 19-9. Biopsy pending  1. I explained to the patient and her family the results of the CT scan and blood work. Given the presence of pancreatic mass, liver lesions and elevated CA-19-9; this is consistent with metastatic pancreatic cancer. I explained to them that treatment for stage IV pancreatic cancer is palliative and not curative. Patient is frail and her performance status is 2 at best. She will not be able to tolerate combination chemotherapy. We could attempt single agent gemcitabine 800 mg/m 2 weeks on and one-week off. However chemotherapy would only had up to a few months but not years for quality of life and longevity. Without chemotherapy her prognosis is less than 6 months. With chemotherapy it could be up to 1 year. Chemotherapy would be associated with significant side effects including all but not limited to nausea, vomiting, fatigue, low blood counts and risk of infections. It would also involve frequent doctor  visits and blood draws.. Should she decide to pursue chemotherapy she would need an ultrasound-guided liver biopsy for definitive diagnosis. Although patient has mild-to-moderate dementia, she still has decision making capacity.  I also offered the alternative of opting for best supportive care/hospice given her performance status. I explained hospice philosophy and that would focus on her quality of care and symptom control.  Patient and her family would like to think about both these options and get back to [place] in a couple of days.  2. Neoplasm related pain- I will prescribe prn morphine 7.5 mg PO Q4 prn for pain.  3. Neoplasm related nausea- will prescribe prn zofran.  Patient and her family had multiple insightful questions and all of them were answered to patient's satisfaction.   Total face to face encounter time for this patient visit was 60 min. >50% of the time was  spent in counseling and coordination of care.     Thank you for this kind referral and the opportunity to participate in the care of this patient   Visit Diagnosis 1. Pancreatic carcinoma metastatic to liver ([place])   2. Goals of care, counseling/discussion   3. Neoplasm related pain   4. Nausea and vomiting, intractability of vomiting not specified, unspecified vomiting type     Dr. Randa Evens, MD, MPH  [place] [facility] Pager- [phone] 07/17/2016  11:29 AM

## 2016-07-17 NOTE — Progress Notes (Signed)
Patient presents as a new patient, she mentions getting easily tired and her appetite is not great but she eats in the day but night time she does not eat much  o2 is 97

## 2016-07-19 ENCOUNTER — Telehealth: Payer: Self-pay | Admitting: *Deleted

## 2016-07-19 NOTE — Telephone Encounter (Signed)
While I was calling pt the pt was calling me on another phone.  Both the patient and her husband was on the phone and per the pt. She has decided to go with hospice and does not want any treatment at this time.  Husband agrees with wife decision and would like hospice to come out. I told them that I will send it tom. Because md is off on wed. So I will need her signature and then hospice will call and set up appt to evaluate pt and determine what her needs are and make a plan based on assessment. Patient and husband verbalized understanding and agreeable to services of hospice. I did tell them that if they change their mind or have questions they can contact me or the office itself.

## 2016-07-21 DIAGNOSIS — E440 Moderate protein-calorie malnutrition: Secondary | ICD-10-CM | POA: Diagnosis not present

## 2016-07-21 DIAGNOSIS — G308 Other Alzheimer's disease: Secondary | ICD-10-CM | POA: Diagnosis not present

## 2016-07-21 DIAGNOSIS — M329 Systemic lupus erythematosus, unspecified: Secondary | ICD-10-CM | POA: Diagnosis not present

## 2016-07-21 DIAGNOSIS — J9611 Chronic respiratory failure with hypoxia: Secondary | ICD-10-CM | POA: Diagnosis not present

## 2016-07-21 DIAGNOSIS — I5020 Unspecified systolic (congestive) heart failure: Secondary | ICD-10-CM | POA: Diagnosis not present

## 2016-07-21 DIAGNOSIS — C787 Secondary malignant neoplasm of liver and intrahepatic bile duct: Secondary | ICD-10-CM | POA: Diagnosis not present

## 2016-07-21 DIAGNOSIS — R251 Tremor, unspecified: Secondary | ICD-10-CM | POA: Diagnosis not present

## 2016-07-21 DIAGNOSIS — I248 Other forms of acute ischemic heart disease: Secondary | ICD-10-CM | POA: Diagnosis not present

## 2016-07-21 DIAGNOSIS — F419 Anxiety disorder, unspecified: Secondary | ICD-10-CM | POA: Diagnosis not present

## 2016-07-21 DIAGNOSIS — M1990 Unspecified osteoarthritis, unspecified site: Secondary | ICD-10-CM | POA: Diagnosis not present

## 2016-07-21 DIAGNOSIS — E039 Hypothyroidism, unspecified: Secondary | ICD-10-CM | POA: Diagnosis not present

## 2016-07-21 DIAGNOSIS — G893 Neoplasm related pain (acute) (chronic): Secondary | ICD-10-CM | POA: Diagnosis not present

## 2016-07-21 DIAGNOSIS — M47816 Spondylosis without myelopathy or radiculopathy, lumbar region: Secondary | ICD-10-CM | POA: Diagnosis not present

## 2016-07-21 DIAGNOSIS — R339 Retention of urine, unspecified: Secondary | ICD-10-CM | POA: Diagnosis not present

## 2016-07-21 DIAGNOSIS — I7300 Raynaud's syndrome without gangrene: Secondary | ICD-10-CM | POA: Diagnosis not present

## 2016-07-21 DIAGNOSIS — F0280 Dementia in other diseases classified elsewhere without behavioral disturbance: Secondary | ICD-10-CM | POA: Diagnosis not present

## 2016-07-21 DIAGNOSIS — I480 Paroxysmal atrial fibrillation: Secondary | ICD-10-CM | POA: Diagnosis not present

## 2016-07-21 DIAGNOSIS — K219 Gastro-esophageal reflux disease without esophagitis: Secondary | ICD-10-CM | POA: Diagnosis not present

## 2016-07-21 DIAGNOSIS — G629 Polyneuropathy, unspecified: Secondary | ICD-10-CM | POA: Diagnosis not present

## 2016-07-21 DIAGNOSIS — K279 Peptic ulcer, site unspecified, unspecified as acute or chronic, without hemorrhage or perforation: Secondary | ICD-10-CM | POA: Diagnosis not present

## 2016-07-21 DIAGNOSIS — C259 Malignant neoplasm of pancreas, unspecified: Secondary | ICD-10-CM | POA: Diagnosis not present

## 2016-07-25 DIAGNOSIS — C787 Secondary malignant neoplasm of liver and intrahepatic bile duct: Secondary | ICD-10-CM | POA: Diagnosis not present

## 2016-07-25 DIAGNOSIS — I5020 Unspecified systolic (congestive) heart failure: Secondary | ICD-10-CM | POA: Diagnosis not present

## 2016-07-25 DIAGNOSIS — G308 Other Alzheimer's disease: Secondary | ICD-10-CM | POA: Diagnosis not present

## 2016-07-25 DIAGNOSIS — G893 Neoplasm related pain (acute) (chronic): Secondary | ICD-10-CM | POA: Diagnosis not present

## 2016-07-25 DIAGNOSIS — J9611 Chronic respiratory failure with hypoxia: Secondary | ICD-10-CM | POA: Diagnosis not present

## 2016-07-25 DIAGNOSIS — C259 Malignant neoplasm of pancreas, unspecified: Secondary | ICD-10-CM | POA: Diagnosis not present

## 2016-07-28 DIAGNOSIS — C259 Malignant neoplasm of pancreas, unspecified: Secondary | ICD-10-CM | POA: Diagnosis not present

## 2016-07-28 DIAGNOSIS — G893 Neoplasm related pain (acute) (chronic): Secondary | ICD-10-CM | POA: Diagnosis not present

## 2016-07-28 DIAGNOSIS — C787 Secondary malignant neoplasm of liver and intrahepatic bile duct: Secondary | ICD-10-CM | POA: Diagnosis not present

## 2016-07-28 DIAGNOSIS — J9611 Chronic respiratory failure with hypoxia: Secondary | ICD-10-CM | POA: Diagnosis not present

## 2016-07-28 DIAGNOSIS — G308 Other Alzheimer's disease: Secondary | ICD-10-CM | POA: Diagnosis not present

## 2016-07-28 DIAGNOSIS — I5020 Unspecified systolic (congestive) heart failure: Secondary | ICD-10-CM | POA: Diagnosis not present

## 2016-08-01 DIAGNOSIS — C787 Secondary malignant neoplasm of liver and intrahepatic bile duct: Secondary | ICD-10-CM | POA: Diagnosis not present

## 2016-08-01 DIAGNOSIS — I5020 Unspecified systolic (congestive) heart failure: Secondary | ICD-10-CM | POA: Diagnosis not present

## 2016-08-01 DIAGNOSIS — C259 Malignant neoplasm of pancreas, unspecified: Secondary | ICD-10-CM | POA: Diagnosis not present

## 2016-08-01 DIAGNOSIS — G308 Other Alzheimer's disease: Secondary | ICD-10-CM | POA: Diagnosis not present

## 2016-08-01 DIAGNOSIS — G893 Neoplasm related pain (acute) (chronic): Secondary | ICD-10-CM | POA: Diagnosis not present

## 2016-08-01 DIAGNOSIS — J9611 Chronic respiratory failure with hypoxia: Secondary | ICD-10-CM | POA: Diagnosis not present

## 2016-08-09 DIAGNOSIS — G893 Neoplasm related pain (acute) (chronic): Secondary | ICD-10-CM | POA: Diagnosis not present

## 2016-08-09 DIAGNOSIS — G308 Other Alzheimer's disease: Secondary | ICD-10-CM | POA: Diagnosis not present

## 2016-08-09 DIAGNOSIS — C259 Malignant neoplasm of pancreas, unspecified: Secondary | ICD-10-CM | POA: Diagnosis not present

## 2016-08-09 DIAGNOSIS — C787 Secondary malignant neoplasm of liver and intrahepatic bile duct: Secondary | ICD-10-CM | POA: Diagnosis not present

## 2016-08-09 DIAGNOSIS — J9611 Chronic respiratory failure with hypoxia: Secondary | ICD-10-CM | POA: Diagnosis not present

## 2016-08-09 DIAGNOSIS — I5020 Unspecified systolic (congestive) heart failure: Secondary | ICD-10-CM | POA: Diagnosis not present

## 2016-08-10 ENCOUNTER — Other Ambulatory Visit: Payer: Self-pay | Admitting: Internal Medicine

## 2016-08-10 ENCOUNTER — Telehealth: Payer: Self-pay

## 2016-08-10 DIAGNOSIS — G893 Neoplasm related pain (acute) (chronic): Secondary | ICD-10-CM | POA: Diagnosis not present

## 2016-08-10 DIAGNOSIS — R251 Tremor, unspecified: Secondary | ICD-10-CM | POA: Diagnosis not present

## 2016-08-10 DIAGNOSIS — I248 Other forms of acute ischemic heart disease: Secondary | ICD-10-CM | POA: Diagnosis not present

## 2016-08-10 DIAGNOSIS — I5020 Unspecified systolic (congestive) heart failure: Secondary | ICD-10-CM | POA: Diagnosis not present

## 2016-08-10 DIAGNOSIS — F0280 Dementia in other diseases classified elsewhere without behavioral disturbance: Secondary | ICD-10-CM | POA: Diagnosis not present

## 2016-08-10 DIAGNOSIS — K219 Gastro-esophageal reflux disease without esophagitis: Secondary | ICD-10-CM | POA: Diagnosis not present

## 2016-08-10 DIAGNOSIS — E440 Moderate protein-calorie malnutrition: Secondary | ICD-10-CM | POA: Diagnosis not present

## 2016-08-10 DIAGNOSIS — J9611 Chronic respiratory failure with hypoxia: Secondary | ICD-10-CM | POA: Diagnosis not present

## 2016-08-10 DIAGNOSIS — E039 Hypothyroidism, unspecified: Secondary | ICD-10-CM | POA: Diagnosis not present

## 2016-08-10 DIAGNOSIS — C259 Malignant neoplasm of pancreas, unspecified: Secondary | ICD-10-CM | POA: Diagnosis not present

## 2016-08-10 DIAGNOSIS — M47816 Spondylosis without myelopathy or radiculopathy, lumbar region: Secondary | ICD-10-CM | POA: Diagnosis not present

## 2016-08-10 DIAGNOSIS — I7300 Raynaud's syndrome without gangrene: Secondary | ICD-10-CM | POA: Diagnosis not present

## 2016-08-10 DIAGNOSIS — M329 Systemic lupus erythematosus, unspecified: Secondary | ICD-10-CM | POA: Diagnosis not present

## 2016-08-10 DIAGNOSIS — G629 Polyneuropathy, unspecified: Secondary | ICD-10-CM | POA: Diagnosis not present

## 2016-08-10 DIAGNOSIS — K279 Peptic ulcer, site unspecified, unspecified as acute or chronic, without hemorrhage or perforation: Secondary | ICD-10-CM | POA: Diagnosis not present

## 2016-08-10 DIAGNOSIS — G308 Other Alzheimer's disease: Secondary | ICD-10-CM | POA: Diagnosis not present

## 2016-08-10 DIAGNOSIS — M1990 Unspecified osteoarthritis, unspecified site: Secondary | ICD-10-CM | POA: Diagnosis not present

## 2016-08-10 DIAGNOSIS — C787 Secondary malignant neoplasm of liver and intrahepatic bile duct: Secondary | ICD-10-CM | POA: Diagnosis not present

## 2016-08-10 DIAGNOSIS — R339 Retention of urine, unspecified: Secondary | ICD-10-CM | POA: Diagnosis not present

## 2016-08-10 DIAGNOSIS — F419 Anxiety disorder, unspecified: Secondary | ICD-10-CM | POA: Diagnosis not present

## 2016-08-10 DIAGNOSIS — I480 Paroxysmal atrial fibrillation: Secondary | ICD-10-CM | POA: Diagnosis not present

## 2016-08-10 IMAGING — CT CT CHEST W/O CM
1 of 2 series · 14 of 32 positions shown, 18 images · non-contrast
Comparison: 11/12/2014

CLINICAL DATA: Pulmonary nodule

EXAM:
CT CHEST WITHOUT CONTRAST
TECHNIQUE: Multidetector CT imaging of the chest was performed following the
standard protocol without IV contrast.

[Series 2: routine chest wo · axial · 0.59mm/px · z∈[-1118,-844]mm · 14 of 67 slices shown, 18 images]
[im 6/67  mediastinal]
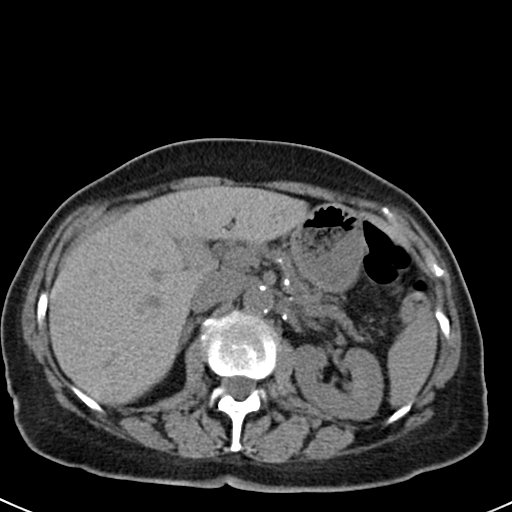
[im 6/67  lung]
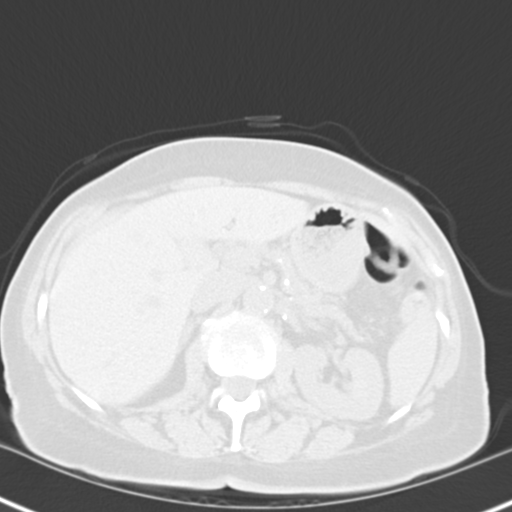
[im 11/67  lung]
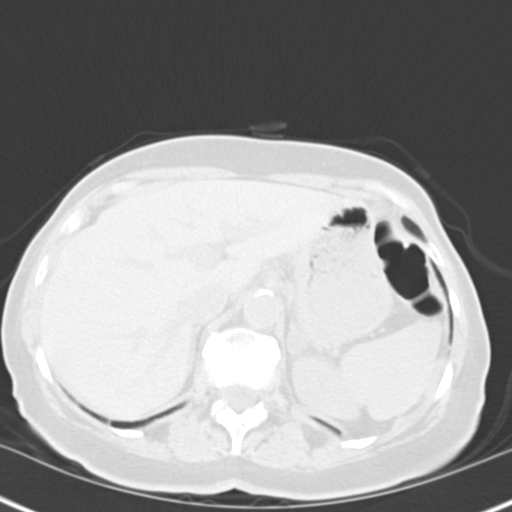
[im 16/67  lung]
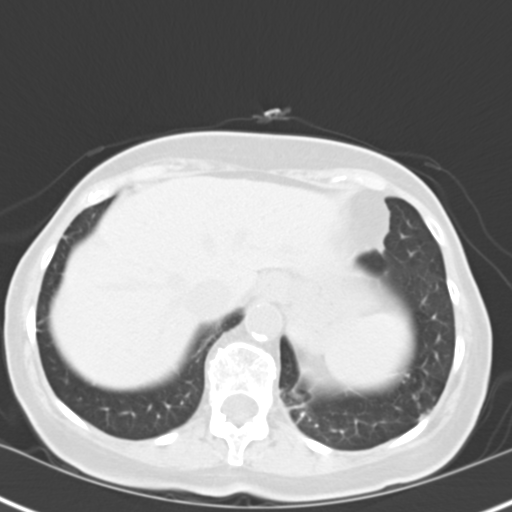
[im 21/67  lung]
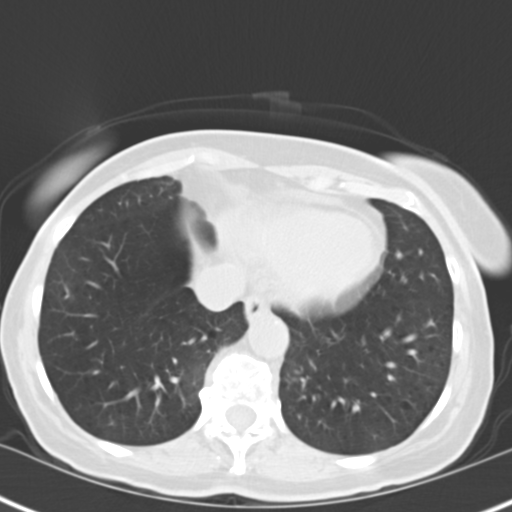
[im 26/67  mediastinal]
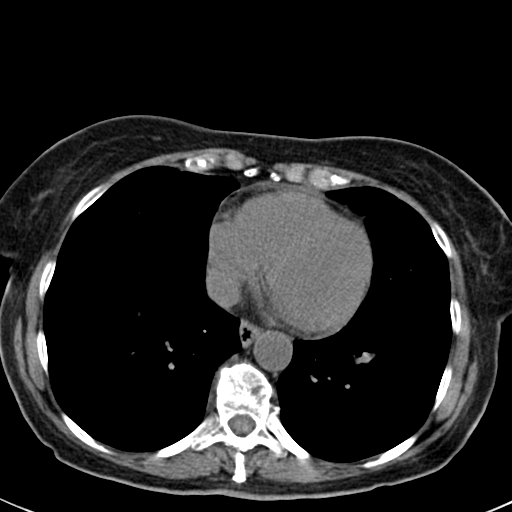
[im 26/67  lung]
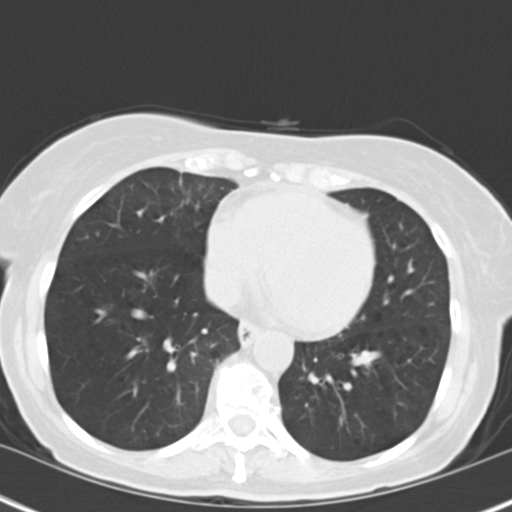
[im 31/67  lung]
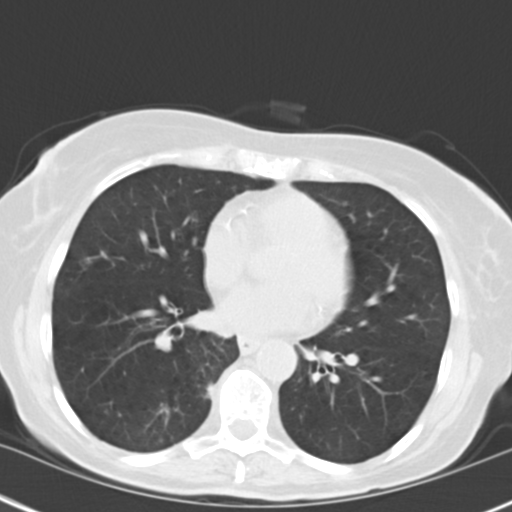
[im 32/67  lung]
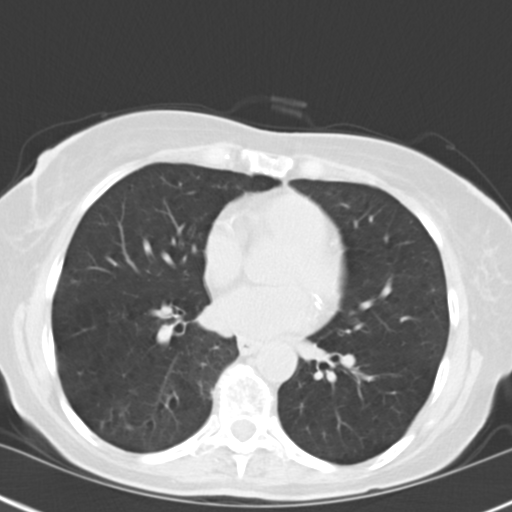
[im 34/67  lung]
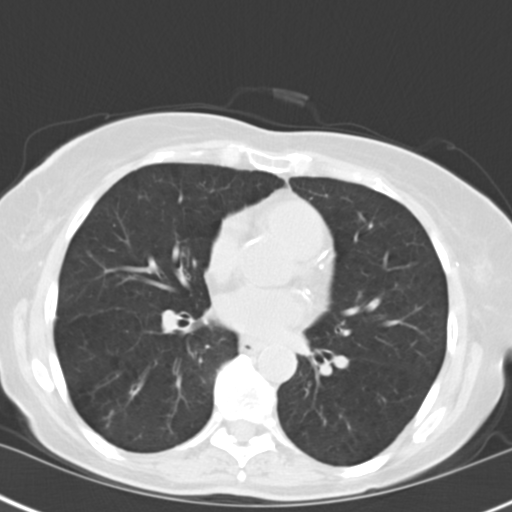
[im 36/67  mediastinal]
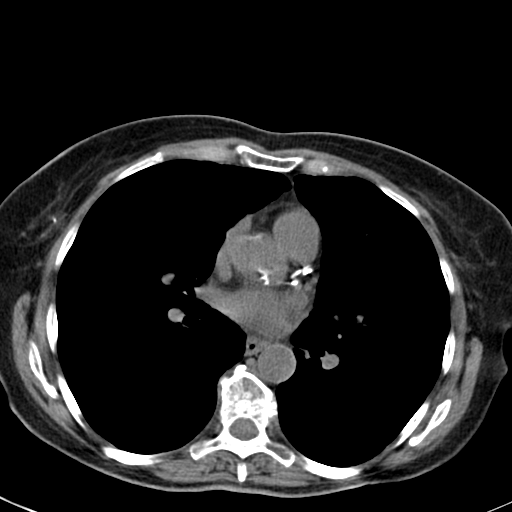
[im 36/67  lung]
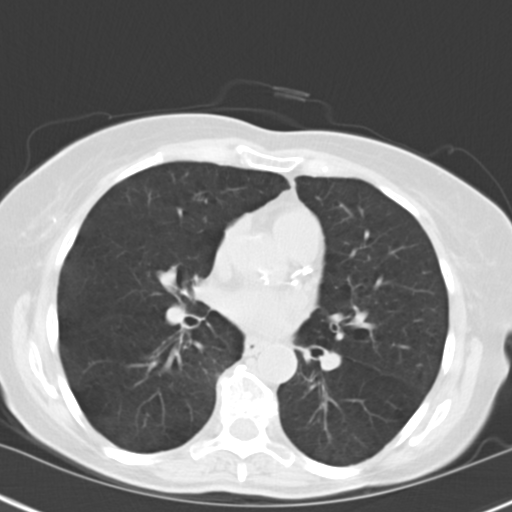
[im 41/67  lung]
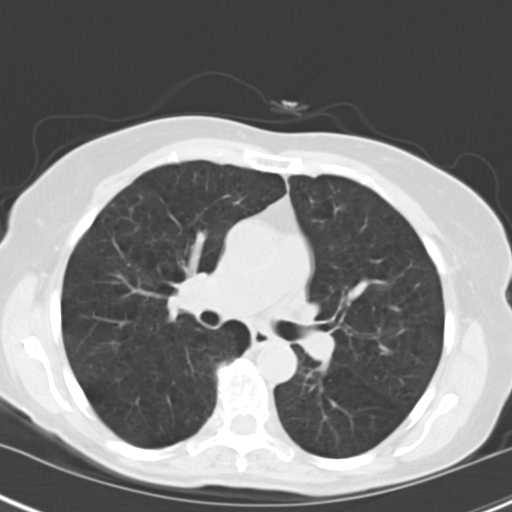
[im 46/67  lung]
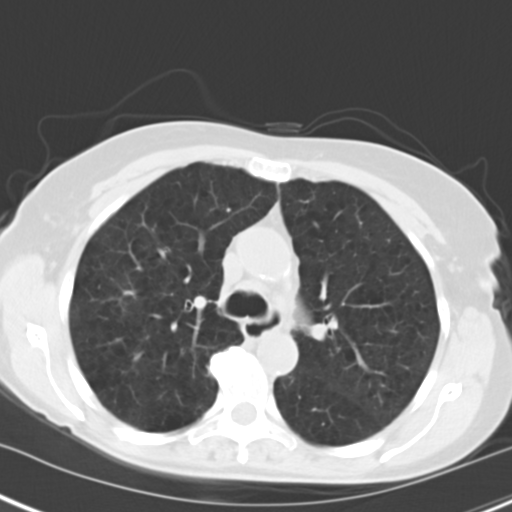
[im 51/67  lung]
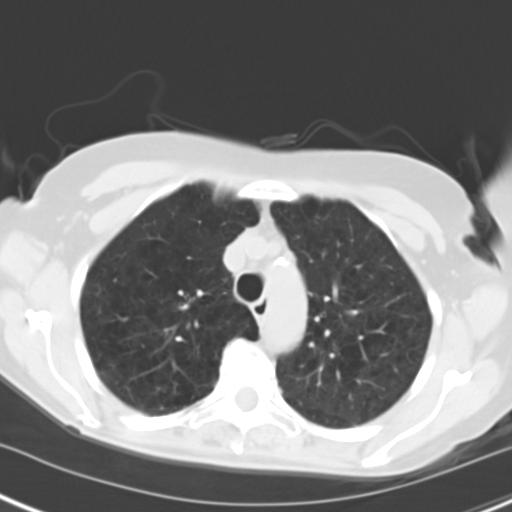
[im 56/67  mediastinal]
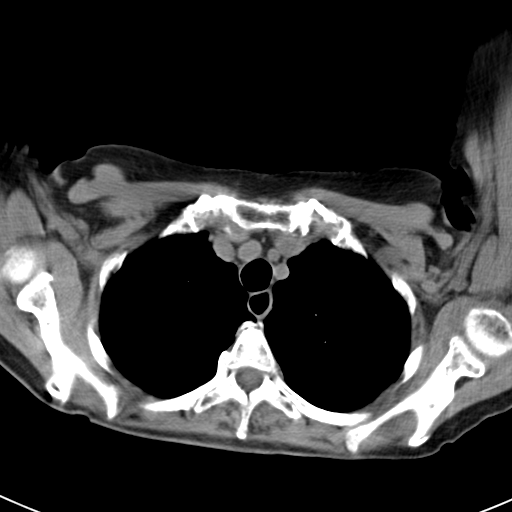
[im 56/67  lung]
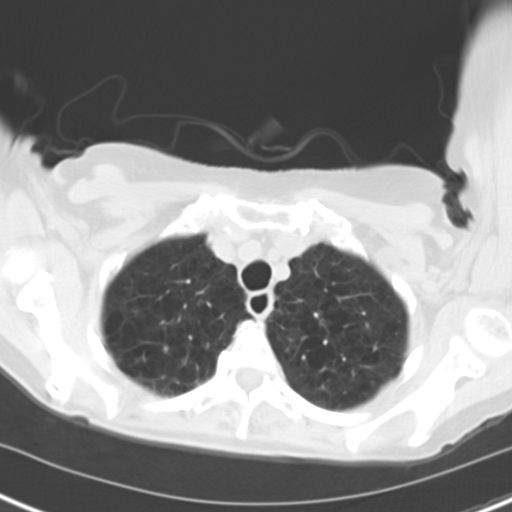
[im 61/67  lung]
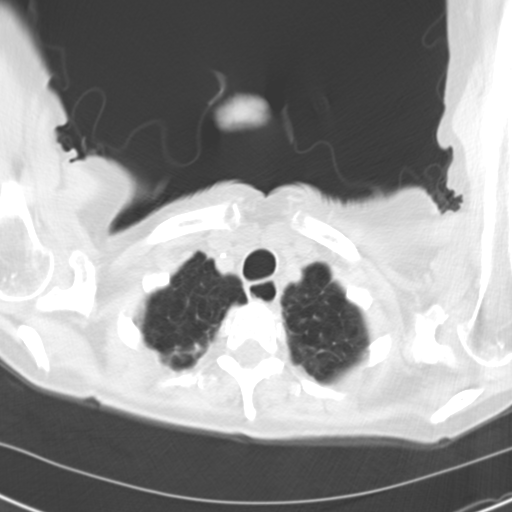

[14 of 32 positions shown; findings below may reference images not displayed]

FINDINGS: Mediastinum / Lymph Nodes: There is no axillary lymphadenopathy. No
mediastinal lymphadenopathy. There is no hilar lymphadenopathy. The
heart size is normal. No pericardial effusion. Coronary artery
calcification is noted. The esophagus has normal imaging features.

Lungs / Pleura: Moderate centrilobular emphysema again noted. 5 x 11
mm nodular density in the posterior right apex is unchanged. Tree in
bud opacity in the right upper lobe is stable and likely represents
scarring. Patchy airspace disease in the medial right middle lobe on
the prior study is decreased in the interval with likely a small
focus of subpleural scarring stable. Scarring seen previously in the
medial left lung base is slightly less prominent today. There is a
new 4 mm left lower lobe pulmonary nodule seen on image 45 series 3.
No pulmonary edema. No pleural effusion.

Upper Abdomen:  Stable 13 mm left adrenal adenoma.

[HOSPITAL] / Soft Tissues: Bone windows reveal no worrisome lytic or
sclerotic osseous lesions.
IMPRESSION: 1. Moderate emphysema again noted with scattered areas of
parenchymal scarring.
2. The progressive nodular density in the posterior right lung apex
seen previously is stable in the 7 months since the prior CT scan.
3. Interval development of a new 4 mm nodule in the posterior left
lung base. Given the rapid interval appearance, this is probably
infectious/inflammatory etiology, but followup CT chest without
contrast in 3-6 months is recommended given the rapid progression of
this nodule.
4. Stable left adrenal adenoma.

## 2016-08-10 MED ORDER — METOPROLOL TARTRATE 25 MG PO TABS: 25.0000 mg | ORAL_TABLET | Freq: Two times a day (BID) | ORAL | 1 refills | Status: AC

## 2016-08-10 NOTE — Telephone Encounter (Signed)
Informed pt about sent in Rx.

## 2016-08-10 NOTE — Telephone Encounter (Signed)
Metoprolol sent to [facility].

## 2016-08-10 NOTE — Telephone Encounter (Signed)
Pt husband "[place]" called stating pt needs refill on Metoprolol (DULERA) sent to [place]. Pt is almost out of this RX.

## 2016-08-15 DIAGNOSIS — J9611 Chronic respiratory failure with hypoxia: Secondary | ICD-10-CM | POA: Diagnosis not present

## 2016-08-15 DIAGNOSIS — C259 Malignant neoplasm of pancreas, unspecified: Secondary | ICD-10-CM | POA: Diagnosis not present

## 2016-08-15 DIAGNOSIS — G893 Neoplasm related pain (acute) (chronic): Secondary | ICD-10-CM | POA: Diagnosis not present

## 2016-08-15 DIAGNOSIS — C787 Secondary malignant neoplasm of liver and intrahepatic bile duct: Secondary | ICD-10-CM | POA: Diagnosis not present

## 2016-08-15 DIAGNOSIS — G308 Other Alzheimer's disease: Secondary | ICD-10-CM | POA: Diagnosis not present

## 2016-08-15 DIAGNOSIS — I5020 Unspecified systolic (congestive) heart failure: Secondary | ICD-10-CM | POA: Diagnosis not present

## 2016-08-17 DIAGNOSIS — C259 Malignant neoplasm of pancreas, unspecified: Secondary | ICD-10-CM | POA: Diagnosis not present

## 2016-08-17 DIAGNOSIS — G308 Other Alzheimer's disease: Secondary | ICD-10-CM | POA: Diagnosis not present

## 2016-08-17 DIAGNOSIS — G893 Neoplasm related pain (acute) (chronic): Secondary | ICD-10-CM | POA: Diagnosis not present

## 2016-08-17 DIAGNOSIS — J9611 Chronic respiratory failure with hypoxia: Secondary | ICD-10-CM | POA: Diagnosis not present

## 2016-08-17 DIAGNOSIS — C787 Secondary malignant neoplasm of liver and intrahepatic bile duct: Secondary | ICD-10-CM | POA: Diagnosis not present

## 2016-08-17 DIAGNOSIS — I5020 Unspecified systolic (congestive) heart failure: Secondary | ICD-10-CM | POA: Diagnosis not present

## 2016-08-21 DIAGNOSIS — G308 Other Alzheimer's disease: Secondary | ICD-10-CM | POA: Diagnosis not present

## 2016-08-21 DIAGNOSIS — C787 Secondary malignant neoplasm of liver and intrahepatic bile duct: Secondary | ICD-10-CM | POA: Diagnosis not present

## 2016-08-21 DIAGNOSIS — G893 Neoplasm related pain (acute) (chronic): Secondary | ICD-10-CM | POA: Diagnosis not present

## 2016-08-21 DIAGNOSIS — C259 Malignant neoplasm of pancreas, unspecified: Secondary | ICD-10-CM | POA: Diagnosis not present

## 2016-08-21 DIAGNOSIS — J9611 Chronic respiratory failure with hypoxia: Secondary | ICD-10-CM | POA: Diagnosis not present

## 2016-08-21 DIAGNOSIS — I5020 Unspecified systolic (congestive) heart failure: Secondary | ICD-10-CM | POA: Diagnosis not present

## 2016-08-22 ENCOUNTER — Telehealth: Payer: Self-pay | Admitting: *Deleted

## 2016-08-22 DIAGNOSIS — G308 Other Alzheimer's disease: Secondary | ICD-10-CM | POA: Diagnosis not present

## 2016-08-22 DIAGNOSIS — C787 Secondary malignant neoplasm of liver and intrahepatic bile duct: Secondary | ICD-10-CM | POA: Diagnosis not present

## 2016-08-22 DIAGNOSIS — J9611 Chronic respiratory failure with hypoxia: Secondary | ICD-10-CM | POA: Diagnosis not present

## 2016-08-22 DIAGNOSIS — I5020 Unspecified systolic (congestive) heart failure: Secondary | ICD-10-CM | POA: Diagnosis not present

## 2016-08-22 DIAGNOSIS — C259 Malignant neoplasm of pancreas, unspecified: Secondary | ICD-10-CM | POA: Diagnosis not present

## 2016-08-22 DIAGNOSIS — G893 Neoplasm related pain (acute) (chronic): Secondary | ICD-10-CM | POA: Diagnosis not present

## 2016-08-22 MED ORDER — INSULIN GLARGINE 100 UNIT/ML ~~LOC~~ SOLN: 5.0000 [IU] | Freq: Every day | SUBCUTANEOUS | 11 refills | Status: AC

## 2016-08-22 NOTE — Telephone Encounter (Signed)
It seems that the only thing that will lower her blood sugar is insulin.  I do not know her current status but if she is alert, eating and interactive, then may need to begin injections.  I would recommend Levemir or Lantus 5 units each morning and continue the oral medications.  The insulin can be very slowly increased if needed.

## 2016-08-22 NOTE — Telephone Encounter (Signed)
Glucose Readings:               1:15 PM- 550            Earlier this AM- 521   8:00 AM - 460        Last evening- 334 She is on Januvia and Glimiperide. No Signs of infection. Please advise

## 2016-08-22 NOTE — Telephone Encounter (Signed)
Can pcp look into this? I know she is hospice patient but his input would be helpful

## 2016-08-22 NOTE — Telephone Encounter (Addendum)
Mitzi informed of Insulin order per VO Dr Janese Banks for Lantus 5 units q AM

## 2016-08-23 DIAGNOSIS — J9611 Chronic respiratory failure with hypoxia: Secondary | ICD-10-CM | POA: Diagnosis not present

## 2016-08-23 DIAGNOSIS — I5020 Unspecified systolic (congestive) heart failure: Secondary | ICD-10-CM | POA: Diagnosis not present

## 2016-08-23 DIAGNOSIS — C787 Secondary malignant neoplasm of liver and intrahepatic bile duct: Secondary | ICD-10-CM | POA: Diagnosis not present

## 2016-08-23 DIAGNOSIS — G308 Other Alzheimer's disease: Secondary | ICD-10-CM | POA: Diagnosis not present

## 2016-08-23 DIAGNOSIS — C259 Malignant neoplasm of pancreas, unspecified: Secondary | ICD-10-CM | POA: Diagnosis not present

## 2016-08-23 DIAGNOSIS — G893 Neoplasm related pain (acute) (chronic): Secondary | ICD-10-CM | POA: Diagnosis not present

## 2016-08-24 DIAGNOSIS — G308 Other Alzheimer's disease: Secondary | ICD-10-CM | POA: Diagnosis not present

## 2016-08-24 DIAGNOSIS — G893 Neoplasm related pain (acute) (chronic): Secondary | ICD-10-CM | POA: Diagnosis not present

## 2016-08-24 DIAGNOSIS — J9611 Chronic respiratory failure with hypoxia: Secondary | ICD-10-CM | POA: Diagnosis not present

## 2016-08-24 DIAGNOSIS — C259 Malignant neoplasm of pancreas, unspecified: Secondary | ICD-10-CM | POA: Diagnosis not present

## 2016-08-24 DIAGNOSIS — C787 Secondary malignant neoplasm of liver and intrahepatic bile duct: Secondary | ICD-10-CM | POA: Diagnosis not present

## 2016-08-24 DIAGNOSIS — I5020 Unspecified systolic (congestive) heart failure: Secondary | ICD-10-CM | POA: Diagnosis not present

## 2016-08-25 ENCOUNTER — Other Ambulatory Visit: Payer: Self-pay | Admitting: Internal Medicine

## 2016-08-25 ENCOUNTER — Telehealth: Payer: Self-pay | Admitting: Internal Medicine

## 2016-08-25 DIAGNOSIS — J9611 Chronic respiratory failure with hypoxia: Secondary | ICD-10-CM | POA: Diagnosis not present

## 2016-08-25 DIAGNOSIS — I5020 Unspecified systolic (congestive) heart failure: Secondary | ICD-10-CM | POA: Diagnosis not present

## 2016-08-25 DIAGNOSIS — C787 Secondary malignant neoplasm of liver and intrahepatic bile duct: Secondary | ICD-10-CM | POA: Diagnosis not present

## 2016-08-25 DIAGNOSIS — G308 Other Alzheimer's disease: Secondary | ICD-10-CM | POA: Diagnosis not present

## 2016-08-25 DIAGNOSIS — G893 Neoplasm related pain (acute) (chronic): Secondary | ICD-10-CM | POA: Diagnosis not present

## 2016-08-25 DIAGNOSIS — C259 Malignant neoplasm of pancreas, unspecified: Secondary | ICD-10-CM | POA: Diagnosis not present

## 2016-08-25 MED ORDER — JANUVIA 100 MG PO TABS: 50.0000 mg | ORAL_TABLET | Freq: Every day | ORAL | 3 refills | Status: DC

## 2016-08-25 MED ORDER — RANITIDINE HCL 150 MG PO TABS: 150.0000 mg | ORAL_TABLET | Freq: Two times a day (BID) | ORAL | 1 refills | Status: AC

## 2016-08-25 MED ORDER — SITAGLIPTIN PHOSPHATE 50 MG PO TABS: 50.0000 mg | ORAL_TABLET | Freq: Every day | ORAL | 0 refills | Status: DC

## 2016-08-25 NOTE — Telephone Encounter (Signed)
Pt's husband called pt is out of her Zantac 150 mg and Januvia 100 mg pharm is [place] pt's husband also asked if we could send a small supply to [facility] [place] for the [place]

## 2016-08-25 NOTE — Telephone Encounter (Signed)
Done

## 2016-08-29 DIAGNOSIS — J9611 Chronic respiratory failure with hypoxia: Secondary | ICD-10-CM | POA: Diagnosis not present

## 2016-08-29 DIAGNOSIS — C259 Malignant neoplasm of pancreas, unspecified: Secondary | ICD-10-CM | POA: Diagnosis not present

## 2016-08-29 DIAGNOSIS — C787 Secondary malignant neoplasm of liver and intrahepatic bile duct: Secondary | ICD-10-CM | POA: Diagnosis not present

## 2016-08-29 DIAGNOSIS — G308 Other Alzheimer's disease: Secondary | ICD-10-CM | POA: Diagnosis not present

## 2016-08-29 DIAGNOSIS — G893 Neoplasm related pain (acute) (chronic): Secondary | ICD-10-CM | POA: Diagnosis not present

## 2016-08-29 DIAGNOSIS — I5020 Unspecified systolic (congestive) heart failure: Secondary | ICD-10-CM | POA: Diagnosis not present

## 2016-08-29 NOTE — Telephone Encounter (Signed)
Pt's husband  picked up Januvia at [facility] in [place] and stated that it was for 50mg  no 100mg ?

## 2016-09-04 ENCOUNTER — Other Ambulatory Visit: Payer: Self-pay | Admitting: Internal Medicine

## 2016-09-04 ENCOUNTER — Telehealth: Payer: Self-pay

## 2016-09-04 DIAGNOSIS — G308 Other Alzheimer's disease: Secondary | ICD-10-CM | POA: Diagnosis not present

## 2016-09-04 DIAGNOSIS — I5020 Unspecified systolic (congestive) heart failure: Secondary | ICD-10-CM | POA: Diagnosis not present

## 2016-09-04 DIAGNOSIS — G893 Neoplasm related pain (acute) (chronic): Secondary | ICD-10-CM | POA: Diagnosis not present

## 2016-09-04 DIAGNOSIS — C787 Secondary malignant neoplasm of liver and intrahepatic bile duct: Secondary | ICD-10-CM | POA: Diagnosis not present

## 2016-09-04 DIAGNOSIS — J9611 Chronic respiratory failure with hypoxia: Secondary | ICD-10-CM | POA: Diagnosis not present

## 2016-09-04 DIAGNOSIS — C259 Malignant neoplasm of pancreas, unspecified: Secondary | ICD-10-CM | POA: Diagnosis not present

## 2016-09-04 MED ORDER — JANUVIA 100 MG PO TABS: 100.0000 mg | ORAL_TABLET | Freq: Every day | ORAL | 3 refills | Status: DC

## 2016-09-04 MED ORDER — JANUVIA 100 MG PO TABS: 100.0000 mg | ORAL_TABLET | Freq: Every day | ORAL | 3 refills | Status: AC

## 2016-09-04 NOTE — Telephone Encounter (Signed)
He requested 100mg  tabs 1 daily be sent to [facility] and for nurse to call him after.

## 2016-09-04 NOTE — Telephone Encounter (Signed)
Sent to [facility].

## 2016-09-04 NOTE — Telephone Encounter (Signed)
Patient calling again to change RX Januvia 100mg  .5 daily should be 100 mg take 1 tab daily. Needs this today BS 200 when taking .5

## 2016-09-04 NOTE — Telephone Encounter (Signed)
Sent to [facility] [place]

## 2016-09-04 NOTE — Telephone Encounter (Signed)
Pt informed

## 2016-09-05 ENCOUNTER — Encounter: Payer: Medicare Other | Attending: Internal Medicine | Admitting: Internal Medicine

## 2016-09-05 ENCOUNTER — Other Ambulatory Visit
Admission: RE | Admit: 2016-09-05 | Discharge: 2016-09-05 | Disposition: A | Discharge status: Home / Self Care | Source: Ambulatory Visit | Attending: Internal Medicine | Admitting: Internal Medicine

## 2016-09-05 ENCOUNTER — Other Ambulatory Visit: Payer: Self-pay

## 2016-09-05 DIAGNOSIS — Z8249 Family history of ischemic heart disease and other diseases of the circulatory system: Secondary | ICD-10-CM | POA: Insufficient documentation

## 2016-09-05 DIAGNOSIS — I7300 Raynaud's syndrome without gangrene: Secondary | ICD-10-CM | POA: Diagnosis not present

## 2016-09-05 DIAGNOSIS — N189 Chronic kidney disease, unspecified: Secondary | ICD-10-CM | POA: Diagnosis not present

## 2016-09-05 DIAGNOSIS — L89311 Pressure ulcer of right buttock, stage 1: Secondary | ICD-10-CM | POA: Diagnosis not present

## 2016-09-05 DIAGNOSIS — Z880 Allergy status to penicillin: Secondary | ICD-10-CM | POA: Diagnosis not present

## 2016-09-05 DIAGNOSIS — I509 Heart failure, unspecified: Secondary | ICD-10-CM | POA: Diagnosis not present

## 2016-09-05 DIAGNOSIS — Z7984 Long term (current) use of oral hypoglycemic drugs: Secondary | ICD-10-CM | POA: Diagnosis not present

## 2016-09-05 DIAGNOSIS — E1122 Type 2 diabetes mellitus with diabetic chronic kidney disease: Secondary | ICD-10-CM | POA: Insufficient documentation

## 2016-09-05 DIAGNOSIS — F0390 Unspecified dementia without behavioral disturbance: Secondary | ICD-10-CM | POA: Diagnosis not present

## 2016-09-05 DIAGNOSIS — L89321 Pressure ulcer of left buttock, stage 1: Secondary | ICD-10-CM | POA: Diagnosis not present

## 2016-09-05 DIAGNOSIS — Z681 Body mass index (BMI) 19 or less, adult: Secondary | ICD-10-CM | POA: Insufficient documentation

## 2016-09-05 DIAGNOSIS — Z87891 Personal history of nicotine dependence: Secondary | ICD-10-CM | POA: Diagnosis not present

## 2016-09-05 DIAGNOSIS — L89620 Pressure ulcer of left heel, unstageable: Secondary | ICD-10-CM | POA: Insufficient documentation

## 2016-09-05 DIAGNOSIS — S91302A Unspecified open wound, left foot, initial encounter: Secondary | ICD-10-CM | POA: Insufficient documentation

## 2016-09-05 DIAGNOSIS — M329 Systemic lupus erythematosus, unspecified: Secondary | ICD-10-CM | POA: Diagnosis not present

## 2016-09-05 DIAGNOSIS — L89611 Pressure ulcer of right heel, stage 1: Secondary | ICD-10-CM | POA: Diagnosis not present

## 2016-09-05 DIAGNOSIS — M1990 Unspecified osteoarthritis, unspecified site: Secondary | ICD-10-CM | POA: Diagnosis not present

## 2016-09-05 DIAGNOSIS — J449 Chronic obstructive pulmonary disease, unspecified: Secondary | ICD-10-CM | POA: Insufficient documentation

## 2016-09-05 DIAGNOSIS — Z881 Allergy status to other antibiotic agents status: Secondary | ICD-10-CM | POA: Insufficient documentation

## 2016-09-05 DIAGNOSIS — C787 Secondary malignant neoplasm of liver and intrahepatic bile duct: Secondary | ICD-10-CM | POA: Diagnosis not present

## 2016-09-05 DIAGNOSIS — I480 Paroxysmal atrial fibrillation: Secondary | ICD-10-CM | POA: Diagnosis not present

## 2016-09-05 DIAGNOSIS — Z882 Allergy status to sulfonamides status: Secondary | ICD-10-CM | POA: Diagnosis not present

## 2016-09-05 DIAGNOSIS — G893 Neoplasm related pain (acute) (chronic): Secondary | ICD-10-CM | POA: Diagnosis not present

## 2016-09-05 DIAGNOSIS — G308 Other Alzheimer's disease: Secondary | ICD-10-CM | POA: Diagnosis not present

## 2016-09-05 DIAGNOSIS — F419 Anxiety disorder, unspecified: Secondary | ICD-10-CM | POA: Insufficient documentation

## 2016-09-05 DIAGNOSIS — J9611 Chronic respiratory failure with hypoxia: Secondary | ICD-10-CM | POA: Diagnosis not present

## 2016-09-05 DIAGNOSIS — L97422 Non-pressure chronic ulcer of left heel and midfoot with fat layer exposed: Secondary | ICD-10-CM | POA: Diagnosis not present

## 2016-09-05 DIAGNOSIS — D490 Neoplasm of unspecified behavior of digestive system: Secondary | ICD-10-CM

## 2016-09-05 DIAGNOSIS — C259 Malignant neoplasm of pancreas, unspecified: Secondary | ICD-10-CM | POA: Insufficient documentation

## 2016-09-05 DIAGNOSIS — E46 Unspecified protein-calorie malnutrition: Secondary | ICD-10-CM | POA: Insufficient documentation

## 2016-09-05 DIAGNOSIS — X58XXXA Exposure to other specified factors, initial encounter: Secondary | ICD-10-CM | POA: Insufficient documentation

## 2016-09-05 DIAGNOSIS — Z885 Allergy status to narcotic agent status: Secondary | ICD-10-CM | POA: Insufficient documentation

## 2016-09-05 DIAGNOSIS — I5020 Unspecified systolic (congestive) heart failure: Secondary | ICD-10-CM | POA: Diagnosis not present

## 2016-09-05 NOTE — Progress Notes (Addendum)
[name], [name] ([id number]) Visit Report for 09/05/2016 Abuse/Suicide Risk Screen Details Patient Name: [name], [name]. Date of Service: 09/05/2016 9:00 AM Medical Record Patient Account Number: [id number] Number: Treating RN: Afful, RN, BSN, Rita December 04, 1935 [phone] y.o. Other Clinician: Date of Birth/Sex: Female) Treating ROBSON, MICHAEL Primary Care Shahzaib Azevedo: Halina Maidens Shevelle Smither/Extender: G Referring Payson Crumby: Evert Kohl in Treatment: 0 Abuse/Suicide Risk Screen Items Answer ABUSE/SUICIDE RISK SCREEN: Has anyone close to you tried to hurt or harm you recentlyo No Do you feel uncomfortable with anyone in your familyo No Has anyone forced you do things that you didnot want to doo No Do you have any thoughts of harming yourselfo No Patient displays signs or symptoms of abuse and/or neglect. No Electronic Signature(s) Signed: 09/05/2016 4:40:29 PM By: Regan Lemming BSN, RN Entered By: Regan Lemming on 09/05/2016 09:04:25 [name] ([id number]) -------------------------------------------------------------------------------- Activities of Daily Living Details Patient Name: [name]. Date of Service: 09/05/2016 9:00 AM Medical Record Patient Account Number: [id number] Number: Treating RN: Afful, RN, BSN, Rita May 21, 1936 [phone] y.o. Other Clinician: Date of Birth/Sex: Female) Treating ROBSON, MICHAEL Primary Care Benetta Maclaren: Halina Maidens Bodhi Stenglein/Extender: G Referring Zaia Carre: Evert Kohl in Treatment: 0 Activities of Daily Living Items Answer Activities of Daily Living (Please select one for each item) Drive Automobile Not Able Take Medications Need Assistance Use Telephone Completely Able Care for Appearance Need Assistance Use Toilet Need Assistance Bath / Shower Need Assistance Dress Self Need Assistance Feed Self Need Assistance Walk Need Assistance Get In / Out Bed Need Assistance Housework Need  Assistance Prepare Meals Need Assistance Handle Money Need Assistance Shop for Self Need Assistance Electronic Signature(s) Signed: 09/05/2016 4:40:29 PM By: Regan Lemming BSN, RN Entered By: Regan Lemming on 09/05/2016 09:05:37 [name] ([id number]) -------------------------------------------------------------------------------- Education Assessment Details Patient Name: [name] Date of Service: 09/05/2016 9:00 AM Medical Record Patient Account Number: [id number] Number: Treating RN: Afful, RN, BSN, Rita 09/23/1935 [phone] y.o. Other Clinician: Date of Birth/Sex: Female) Treating ROBSON, MICHAEL Primary Care Clarissa Laird: Halina Maidens Grainger Mccarley/Extender: G Referring Auguste Tebbetts: Evert Kohl in Treatment: 0 Primary Learner Assessed: Patient Learning Preferences/Education Level/Primary Language Learning Preference: Explanation Highest Education Level: College or Above Preferred Language: English Cognitive Barrier Assessment/Beliefs Language Barrier: No Physical Barrier Assessment Impaired Vision: No Impaired Hearing: No Decreased Hand dexterity: No Knowledge/Comprehension Assessment Knowledge Level: Medium Comprehension Level: Medium Ability to understand written Medium instructions: Ability to understand verbal Medium instructions: Motivation Assessment Anxiety Level: Calm Cooperation: Cooperative Education Importance: Acknowledges Need Interest in Health Problems: Asks Questions Perception: Coherent Willingness to Engage in Self- Medium Management Activities: Readiness to Engage in Self- Medium Management Activities: Electronic Signature(s) Signed: 09/05/2016 8:49:32 AM By: Regan Lemming BSN, RN Entered By: Regan Lemming on 09/05/2016 08:49:32 KONYA, FAUBLE ([id number], HEAGLE ([id number]) -------------------------------------------------------------------------------- Fall Risk Assessment Details Patient Name: [name]. Date of Service: 09/05/2016 9:00 AM Medical Record Patient Account Number: [id number] Number: Treating RN: Afful, RN, BSN, Rita 03/18/1936 [phone] y.o. Other Clinician: Date of Birth/Sex: Female) Treating ROBSON, MICHAEL Primary Care Woody Kronberg: Halina Maidens Ian Castagna/Extender: G Referring Dameshia Seybold: Evert Kohl in Treatment: 0 Fall Risk Assessment Items Have you had 2 or more falls in the last 12 monthso 0 No Have you had any fall that resulted in injury in the last 12 monthso 0 No FALL RISK ASSESSMENT: History of falling - immediate or within 3 months 0 No Secondary diagnosis 0 No Ambulatory aid None/bed rest/wheelchair/nurse 0 Yes Crutches/cane/walker 15  Yes Furniture 0 No IV Access/Saline Lock 0 No Gait/Training Normal/bed rest/immobile 0 No Weak 10 Yes Impaired 20 Yes Mental Status Oriented to own ability 0 Yes Electronic Signature(s) Signed: 09/05/2016 4:40:29 PM By: Regan Lemming BSN, RN Entered By: Regan Lemming on 09/05/2016 09:06:18 [name] ([id number]) -------------------------------------------------------------------------------- Foot Assessment Details Patient Name: [name]. Date of Service: 09/05/2016 9:00 AM Medical Record Patient Account Number: [id number] Number: Treating RN: Afful, RN, BSN, Rita Jan 07, 1936 [phone] y.o. Other Clinician: Date of Birth/Sex: Female) Treating ROBSON, MICHAEL Primary Care Cova Knieriem: Halina Maidens Doloris Servantes/Extender: G Referring Cyrus Ramsburg: Evert Kohl in Treatment: 0 Foot Assessment Items Site Locations + = Sensation present, - = Sensation absent, C = Callus, U = Ulcer R = Redness, W = Warmth, M = Maceration, PU = Pre-ulcerative lesion F = Fissure, S = Swelling, D = Dryness Assessment Right: Left: Other Deformity: No No Prior Foot Ulcer: No No Prior Amputation: No No Charcot Joint: No No Ambulatory Status: Ambulatory With Help Assistance Device: Wheelchair Gait:  Buyer, retail Signature(s) Signed: 09/05/2016 4:40:29 PM By: Regan Lemming BSN, RN Entered By: Regan Lemming on 09/05/2016 09:06:47 [name] ([name] ([id number]) -------------------------------------------------------------------------------- Nutrition Risk Assessment Details Patient Name: [name] Date of Service: 09/05/2016 9:00 AM Medical Record Patient Account Number: [id number] Number: Treating RN: Afful, RN, BSN, Rita December 27, 1935 [phone] y.o. Other Clinician: Date of Birth/Sex: Female) Treating ROBSON, MICHAEL Primary Care Steele Ledonne: Halina Maidens Deysha Cartier/Extender: G Referring Garold Sheeler: Evert Kohl in Treatment: 0 Height (in): 64 Weight (lbs): 110 Body Mass Index (BMI): 18.9 Nutrition Risk Assessment Items NUTRITION RISK SCREEN: I have an illness or condition that made me change the kind and/or 0 No amount of food I eat I eat fewer than two meals per day 0 No I eat few fruits and vegetables, or milk products 0 No I have three or more drinks of beer, liquor or wine almost every day 0 No I have tooth or mouth problems that make it hard for me to eat 0 No I don't always have enough money to buy the food I need 0 No I eat alone most of the time 0 No I take three or more different prescribed or over-the-counter drugs a 0 No day Without wanting to, I have lost or gained 10 pounds in the last six 0 No months I am not always physically able to shop, cook and/or feed myself 0 No Nutrition Protocols Good Risk Protocol 0 No interventions needed Moderate Risk Protocol Electronic Signature(s) Signed: 09/05/2016 4:40:29 PM By: Regan Lemming BSN, RN Entered By: Regan Lemming on 09/05/2016 09:06:35

## 2016-09-05 NOTE — Progress Notes (Addendum)
[name], [name] ([id number]) Visit Report for 09/05/2016 Allergy List Details Patient Name: [name], [name]. Date of Service: 09/05/2016 9:00 AM Medical Record Patient Account Number: [id number] Number: Treating RN: Afful, RN, BSN, Rita [birth year] [phone] y.o. Other Clinician: Date of Birth/Sex: Female) Treating ROBSON, MICHAEL Primary Care Rayshad Riviello: Halina Maidens Pape Parson/Extender: G Referring Jamilah Jean: Evert Kohl in Treatment: 0 Allergies Active Allergies codeine penicillin Sulfa (Sulfonamide Antibiotics) doxycycline erythromycin base latex Allergy Notes Electronic Signature(s) Signed: 09/05/2016 8:48:53 AM By: Regan Lemming BSN, RN Entered By: Regan Lemming on 09/05/2016 08:48:53 [name] ([id number]) -------------------------------------------------------------------------------- [place] Information Details Patient Name: [name] Date of Service: 09/05/2016 9:00 AM Medical Record Patient Account Number: [id number] Number: Treating RN: Afful, RN, BSN, Rita Dec 07, 1935 [phone] y.o. Other Clinician: Date of Birth/Sex: Female) Treating ROBSON, MICHAEL Primary Care Karin Griffith: Halina Maidens Wynton Hufstetler/Extender: G Referring Elynor Kallenberger: Evert Kohl in Treatment: 0 Visit Information Patient Arrived: Wheel Chair Arrival Time: 09:03 Accompanied By: husband Transfer Assistance: EasyPivot Patient Lift Patient Identification Verified: Yes Secondary Verification Process Yes Completed: Patient Requires Transmission- No Based Precautions: Patient Has Alerts: No History Since Last Visit All ordered tests and consults were completed: No Added or deleted any medications: No Any new allergies or adverse reactions: No Had a fall or experienced change in activities of daily living that may affect risk of falls: No Signs or symptoms of abuse/neglect since last visito No Hospitalized since last visit: No Electronic  Signature(s) Signed: 09/05/2016 4:40:29 PM By: Regan Lemming BSN, RN Entered By: Regan Lemming on 09/05/2016 09:10:18 [name] ([id number]) -------------------------------------------------------------------------------- Clinic Level of Care Assessment Details Patient Name: [name] Date of Service: 09/05/2016 9:00 AM Medical Record Patient Account Number: [id number] Number: Treating RN: Afful, RN, BSN, Rita 1936/05/17 [phone] y.o. Other Clinician: Date of Birth/Sex: Female) Treating ROBSON, MICHAEL Primary Care Sourish Allender: Halina Maidens Shannelle Alguire/Extender: G Referring Kinzleigh Kandler: Evert Kohl in Treatment: 0 Clinic Level of Care Assessment Items TOOL 2 Quantity Score []  - Use when only an EandM is performed on the INITIAL visit 0 ASSESSMENTS - Nursing Assessment / Reassessment X - General Physical Exam (combine w/ comprehensive assessment (listed just 1 20 below) when performed on new pt. evals) X - Comprehensive Assessment (HX, ROS, Risk Assessments, Wounds Hx, etc.) 1 25 ASSESSMENTS - Wound and Skin Assessment / Reassessment X - Simple Wound Assessment / Reassessment - one wound 1 5 []  - Complex Wound Assessment / Reassessment - multiple wounds 0 []  - Dermatologic / Skin Assessment (not related to wound area) 0 ASSESSMENTS - Ostomy and/or Continence Assessment and Care []  - Incontinence Assessment and Management 0 []  - Ostomy Care Assessment and Management (repouching, etc.) 0 PROCESS - Coordination of Care X - Simple Patient / Family Education for ongoing care 1 15 []  - Complex (extensive) Patient / Family Education for ongoing care 0 X - Staff obtains Programmer, systems, Records, Test Results / Process Orders 1 10 []  - Staff telephones HHA, Nursing Homes / Clarify orders / etc 0 []  - Routine Transfer to another Facility (non-emergent condition) 0 []  - Routine Hospital Admission (non-emergent condition) 0 X - New Admissions / Biomedical engineer /  Ordering NPWT, Apligraf, etc. 1 15 []  - Emergency Hospital Admission (emergent condition) 0 [name], [name] ([id number]) []  - Simple Discharge Coordination 0 []  - Complex (extensive) Discharge Coordination 0 PROCESS - Special Needs []  - Pediatric / Minor Patient Management 0 []  - Isolation Patient Management 0 []  - Hearing / Language / Visual special  needs 0 []  - Assessment of Community assistance (transportation, D/C planning, etc.) 0 []  - Additional assistance / Altered mentation 0 []  - Support Surface(s) Assessment (bed, cushion, seat, etc.) 0 INTERVENTIONS - Wound Cleansing / Measurement X - Wound Imaging (photographs - any number of wounds) 1 5 []  - Wound Tracing (instead of photographs) 0 X - Simple Wound Measurement - one wound 1 5 []  - Complex Wound Measurement - multiple wounds 0 X - Simple Wound Cleansing - one wound 1 5 []  - Complex Wound Cleansing - multiple wounds 0 INTERVENTIONS - Wound Dressings X - Small Wound Dressing one or multiple wounds 1 10 []  - Medium Wound Dressing one or multiple wounds 0 []  - Large Wound Dressing one or multiple wounds 0 []  - Application of Medications - injection 0 INTERVENTIONS - Miscellaneous []  - External ear exam 0 X - Specimen Collection (cultures, biopsies, blood, body fluids, etc.) 1 5 []  - Specimen(s) / Culture(s) sent or taken to Lab for analysis 0 []  - Patient Transfer (multiple staff / Harrel Lemon Lift / Similar devices) 0 []  - Simple Staple / Suture removal (25 or less) 0 [name], [name]. ([id number]) []  - Complex Staple / Suture removal (26 or more) 0 []  - Hypo / Hyperglycemic Management (close monitor of Blood Glucose) 0 X - Ankle / Brachial Index (ABI) - do not check if billed separately 1 15 Has the patient been seen at the hospital within the last three years: Yes Total Score: 135 Level Of Care: New/Established - Level 4 Electronic Signature(s) Signed: 09/05/2016 4:40:29 PM By: Regan Lemming BSN, RN Entered By: Regan Lemming on  09/05/2016 09:34:12 [name] ([id number]) -------------------------------------------------------------------------------- Encounter Discharge Information Details Patient Name: [name] Date of Service: 09/05/2016 9:00 AM Medical Record Patient Account Number: [id number] Number: Treating RN: Afful, RN, BSN, Rita 1936/04/18 [phone] y.o. Other Clinician: Date of Birth/Sex: Female) Treating ROBSON, MICHAEL Primary Care Jaisa Defino: Halina Maidens Alan Drummer/Extender: G Referring Coretta Leisey: Evert Kohl in Treatment: 0 Encounter Discharge Information Items Discharge Pain Level: 0 Discharge Condition: Stable Ambulatory Status: Wheelchair Discharge Destination: Home Transportation: Private Auto Accompanied By: Micheline Rough, dtr kim Schedule Follow-up Appointment: No Medication Reconciliation completed and provided to Patient/Care No Deniece Rankin: Provided on Clinical Summary of Care: 09/05/2016 Form Type Recipient Paper Patient LK Electronic Signature(s) Signed: 09/05/2016 10:01:44 AM By: Ruthine Dose Entered By: Ruthine Dose on 09/05/2016 10:01:44 [name] ([id number]) -------------------------------------------------------------------------------- General Visit Notes Details Patient Name: [name] Date of Service: 09/05/2016 9:00 AM Medical Record Patient Account Number: [id number] Number: Treating RN: Afful, RN, BSN, Rita 09-15-1935 [phone] y.o. Other Clinician: Date of Birth/Sex: Female) Treating ROBSON, MICHAEL Primary Care Shyloh Derosa: Halina Maidens Blaise Palladino/Extender: G Referring Malik Ruffino: Evert Kohl in Treatment: 0 Notes Patient has stage 1 on right heel and sacral area. MD provided education to family on offloading measures. Electronic Signature(s) Signed: 09/05/2016 4:40:29 PM By: Regan Lemming BSN, RN Entered By: Regan Lemming on 09/05/2016 09:49:39 [name]  ([id number]) -------------------------------------------------------------------------------- Lower Extremity Assessment Details Patient Name: [name] Date of Service: 09/05/2016 9:00 AM Medical Record Patient Account Number: [id number] Number: Treating RN: Afful, RN, BSN, Rita [birth year] [phone] y.o. Other Clinician: Date of Birth/Sex: Female) Treating ROBSON, MICHAEL Primary Care Kamar Callender: Halina Maidens Gerri Acre/Extender: G Referring Kyrstan Gotwalt: Evert Kohl in Treatment: 0 Edema Assessment Assessed: [Left: No] [Right: No] E[Left: dema] [Right: :] Calf Left: Right: Point of Measurement: 33 cm From Medial Instep 28 cm 28 cm Ankle Left: Right: Point  of Measurement: 8 cm From Medial Instep 18 cm 18 cm Vascular Assessment Pulses: Dorsalis Pedis Palpable: [Left:Yes] [Right:Yes] Posterior Tibial Palpable: [Left:Yes] [Right:Yes] Extremity colors, hair growth, and conditions: Extremity Color: [Left:Normal] [Right:Normal] Hair Growth on Extremity: [Left:Yes] [Right:Yes] Temperature of Extremity: [Left:Warm] [Right:Warm] Capillary Refill: [Left:< 3 seconds] [Right:< 3 seconds] Blood Pressure: Brachial: [Left:144] Dorsalis Pedis: 165 [Left:Dorsalis Pedis: 166] Ankle: Posterior Tibial: [Left:Posterior Tibial: 1.15] [Right:1.15] Toe Nail Assessment Left: Right: Thick: No No Discolored: No No Deformed: No No Improper Length and Hygiene: No No [name], [name] ([id number]) Electronic Signature(s) Signed: 09/05/2016 4:40:29 PM By: Regan Lemming BSN, RN Entered By: Regan Lemming on 09/05/2016 09:22:09 [name] ([id number]) -------------------------------------------------------------------------------- Multi Wound Chart Details Patient Name: [name] Date of Service: 09/05/2016 9:00 AM Medical Record Patient Account Number: [id number] Number: Treating RN: Afful, RN, BSN, Rita 1935-08-25 [phone] y.o. Other Clinician: Date of  Birth/Sex: Female) Treating ROBSON, MICHAEL Primary Care Romanita Fager: Halina Maidens Neddie Steedman/Extender: G Referring Keithon Mccoin: Evert Kohl in Treatment: 0 Vital Signs Height(in): 64 Pulse(bpm): 71 Weight(lbs): 100 Blood Pressure 144/64 (mmHg): Body Mass Index(BMI): 17 Temperature(F): 98 Respiratory Rate 18 (breaths/min): Photos: [10:No Photos] [N/A:No Photos N/A] Wound Location: [10:Left Calcaneus - Medial Left, Medial Calcaneus] [N/A:N/A] Wounding Event: [10:Blister] [N/A:Blister N/A] Primary Etiology: [10:Pressure Ulcer] [N/A:Pressure Ulcer N/A] Comorbid History: [10:Chronic Obstructive Pulmonary Disease (COPD), Angina, Congestive Heart Failure, Type II Diabetes, Lupus Erythematosus, Osteoarthritis, Dementia] [N/A:N/A N/A] Date Acquired: [10:09/04/2016] [N/A:09/04/2016 N/A] Weeks of Treatment: [10:0] [N/A:0 N/A] Wound Status: [10:Open] [N/A:Healed - Epithelialized N/A] Measurements L x W x D 4.5x4x0.1 [N/A:0x0x0 N/A] (cm) Area (cm) : [10:14.137] [N/A:0 N/A] Volume (cm) : [10:1.414] [N/A:0 N/A] Classification: [10:Unstageable/Unclassified N/A] [N/A:N/A] HBO Classification: [10:Unable to visualize wound N/A bed] [N/A:N/A] Exudate Amount: [10:Small] [N/A:N/A N/A] Exudate Type: [10:Serosanguineous] [N/A:N/A N/A] Exudate Color: [10:red, brown] [N/A:N/A N/A] Wound Margin: [10:Distinct, outline attached N/A] [N/A:N/A] Granulation Amount: [10:None Present (0%)] [N/A:N/A N/A] Necrotic Amount: [10:Large (67-100%)] [N/A:N/A N/A] Necrotic Tissue: [10:Eschar, Adherent Slough N/A] [N/A:N/A] Exposed Structures: Fat Layer (Subcutaneous N/A N/A Tissue) Exposed: Yes Fascia: No Tendon: No Muscle: No Joint: No Bone: No Epithelialization: None N/A N/A Periwound Skin Texture: Excoriation: No No Abnormalities Noted N/A Induration: No Callus: No Crepitus: No Rash: No Scarring: No Periwound Skin Maceration: No No Abnormalities Noted N/A Moisture: Dry/Scaly: No Periwound  Skin Color: Atrophie Blanche: No No Abnormalities Noted N/A Cyanosis: No Ecchymosis: No Erythema: No Hemosiderin Staining: No Mottled: No Pallor: No Rubor: No Temperature: No Abnormality N/A N/A Tenderness on Yes No N/A Palpation: Wound Preparation: Ulcer Cleansing: N/A N/A Rinsed/Irrigated with Saline Topical Anesthetic Applied: Other: lidocaine 4% Treatment Notes Wound #10 (Left, Medial Calcaneus) 1. Cleansed with: Cleanse wound with antibacterial soap and water 4. Dressing Applied: Aquacel Ag 5. Secondary Dressing Applied ABD and Kerlix/Conform 6. Footwear/Offloading device applied Other footwear/offloading device applied (specify in notes) 7. Secured with Tape Notes Patient to wear bunny boot [name], [name] ([id number]) Electronic Signature(s) Signed: 09/06/2016 7:58:45 AM By: Linton Ham MD Entered By: Linton Ham on 09/05/2016 09:56:48 [name] ([id number]) -------------------------------------------------------------------------------- [place] Details Patient Name: [name], [name]. Date of Service: 09/05/2016 9:00 AM Medical Record Patient Account Number: [id number] Number: Treating RN: Afful, RN, BSN, Rita December 02, 1935 [phone] y.o. Other Clinician: Date of Birth/Sex: Female) Treating ROBSON, MICHAEL Primary Care Gage Treiber: Halina Maidens Cashmere Dingley/Extender: G Referring Nihaal Friesen: Evert Kohl in Treatment: 0 Active Inactive ` Orientation to the Wound Care Program Nursing Diagnoses: Knowledge deficit related to the wound healing center program Goals: Patient/caregiver  will verbalize understanding of the [place] Date Initiated: 09/05/2016 Target Resolution Date: 01/05/2017 Goal Status: Active Interventions: Provide education on orientation to the wound center Notes: ` Pressure Nursing Diagnoses: Knowledge deficit related to causes and risk factors for pressure ulcer  development Potential for impaired tissue integrity related to pressure, friction, moisture, and shear Goals: Patient will remain free from development of additional pressure ulcers Date Initiated: 09/05/2016 Target Resolution Date: 01/05/2017 Goal Status: Active Patient will remain free of pressure ulcers Date Initiated: 09/05/2016 Target Resolution Date: 01/05/2017 Goal Status: Active Patient/caregiver will verbalize risk factors for pressure ulcer development Date Initiated: 09/05/2016 Target Resolution Date: 01/05/2017 Goal Status: Active Patient/caregiver will verbalize understanding of pressure ulcer management Date Initiated: 09/05/2016 Target Resolution Date: 01/05/2017 [name], [name] ([id number]) Goal Status: Active Interventions: Assess: immobility, friction, shearing, incontinence upon admission and as needed Assess offloading mechanisms upon admission and as needed Assess potential for pressure ulcer upon admission and as needed Provide education on pressure ulcers Treatment Activities: Patient referred for pressure reduction/relief devices : 09/05/2016 Notes: ` Wound/Skin Impairment Nursing Diagnoses: Impaired tissue integrity Knowledge deficit related to ulceration/compromised skin integrity Goals: Patient will have a decrease in wound volume by X% from date: (specify in notes) Date Initiated: 09/05/2016 Target Resolution Date: 01/06/2017 Goal Status: Active Patient/caregiver will verbalize understanding of skin care regimen Date Initiated: 09/05/2016 Target Resolution Date: 01/06/2017 Goal Status: Active Ulcer/skin breakdown will have a volume reduction of 30% by week 4 Date Initiated: 09/05/2016 Target Resolution Date: 01/06/2017 Goal Status: Active Ulcer/skin breakdown will have a volume reduction of 50% by week 8 Date Initiated: 09/05/2016 Target Resolution Date: 01/06/2017 Goal Status: Active Ulcer/skin breakdown will have a volume reduction of 80% by week  12 Date Initiated: 09/05/2016 Target Resolution Date: 01/06/2017 Goal Status: Active Ulcer/skin breakdown will heal within 14 weeks Date Initiated: 09/05/2016 Target Resolution Date: 01/06/2017 Goal Status: Active Interventions: Assess patient/caregiver ability to perform ulcer/skin care regimen upon admission and as needed Assess ulceration(s) every visit Provide education on ulcer and skin care Treatment Activities: [name], [name] ([id number]) Referred to DME Jahson Emanuele for dressing supplies : 09/05/2016 Skin care regimen initiated : 09/05/2016 Topical wound management initiated : 09/05/2016 Notes: Electronic Signature(s) Signed: 09/05/2016 4:40:29 PM By: Regan Lemming BSN, RN Entered By: Regan Lemming on 09/05/2016 09:30:20 [name] ([id number]) -------------------------------------------------------------------------------- Pain Assessment Details Patient Name: [name] Date of Service: 09/05/2016 9:00 AM Medical Record Patient Account Number: [id number] Number: Treating RN: Afful, RN, BSN, Rita 1936/02/27 [phone] y.o. Other Clinician: Date of Birth/Sex: Female) Treating ROBSON, MICHAEL Primary Care Elanor Cale: Halina Maidens Tristin Gladman/Extender: G Referring Franny Selvage: Evert Kohl in Treatment: 0 Active Problems Location of Pain Severity and Description of Pain Patient Has Paino No Site Locations With Dressing Change: No Pain Management and Medication Current Pain Management: Electronic Signature(s) Signed: 09/05/2016 4:40:29 PM By: Regan Lemming BSN, RN Entered By: Regan Lemming on 09/05/2016 09:10:26 [name] ([id number]) -------------------------------------------------------------------------------- Patient/Caregiver Education Details Patient Name: [name] Date of Service: 09/05/2016 9:00 AM Medical Record Patient Account Number: [id number] Number: Treating RN: Afful, RN, BSN, Rita 1935/07/05 [phone] y.o. Other  Clinician: Date of Birth/Gender: Female) Treating ROBSON, MICHAEL Primary Care Physician/Extender: Geanie Berlin Physician: Suella [place] in Treatment: 0 Referring Physician: Halina Maidens Education Assessment Education Provided To: Patient and Caregiver Education Topics Provided Pressure: Methods: Explain/Verbal Welcome To [facility] [place]: Methods: Explain/Verbal Wound/Skin Impairment: Methods: Explain/Verbal Electronic Signature(s) Signed: 09/05/2016 4:40:29 PM By: Regan Lemming BSN, RN Entered  ByRegan Lemming on 09/05/2016 10:01:25 [name] ([id number]) -------------------------------------------------------------------------------- Wound Assessment Details Patient Name: [name] Date of Service: 09/05/2016 9:00 AM Medical Record Patient Account Number: [id number] Number: Treating RN: Afful, RN, BSN, Rita [birth year] [phone] y.o. Other Clinician: Date of Birth/Sex: Female) Treating ROBSON, MICHAEL Primary Care Jasha Hodzic: Halina Maidens Sueanne Maniaci/Extender: G Referring Anysha Frappier: Evert Kohl in Treatment: 0 Wound Status Wound Number: 10 Primary Pressure Ulcer Etiology: Wound Location: Left Calcaneus - Medial Wound Open Wounding Event: Blister Status: Date Acquired: 09/04/2016 Comorbid Chronic Obstructive Pulmonary Disease Weeks Of Treatment: 0 History: (COPD), Angina, Congestive Heart Clustered Wound: No Failure, Type II Diabetes, Lupus Erythematosus, Osteoarthritis, Dementia Photos Photo Uploaded By: Regan Lemming on 09/05/2016 16:26:55 Wound Measurements Length: (cm) 4.5 % Reduction i Width: (cm) 4 % Reduction i Depth: (cm) 0.1 Epithelializa Area: (cm) 14.137 Tunneling: Volume: (cm) 1.414 Undermining: n Area: n Volume: tion: None No No Wound Description Classification: Unstageable/Unclassified Diabetic Severity Unable to visualize wound Earleen Newport): bed Wound Margin: Distinct, outline attached Exudate Amount:  Small Exudate Type: Serosanguineous Exudate Color: red, brown [name], [name] ([id number]) Foul Odor After Cleansing: No Slough/Fibrino Yes Wound Bed Granulation Amount: None Present (0%) Exposed Structure Necrotic Amount: Large (67-100%) Fascia Exposed: No Necrotic Quality: Eschar, Adherent Slough Fat Layer (Subcutaneous Tissue) Exposed: Yes Tendon Exposed: No Muscle Exposed: No Joint Exposed: No Bone Exposed: No Periwound Skin Texture Texture Color No Abnormalities Noted: No No Abnormalities Noted: No Callus: No Atrophie Blanche: No Crepitus: No Cyanosis: No Excoriation: No Ecchymosis: No Induration: No Erythema: No Rash: No Hemosiderin Staining: No Scarring: No Mottled: No Pallor: No Moisture Rubor: No No Abnormalities Noted: No Dry / Scaly: No Temperature / Pain Maceration: No Temperature: No Abnormality Tenderness on Palpation: Yes Wound Preparation Ulcer Cleansing: Rinsed/Irrigated with Saline Topical Anesthetic Applied: Other: lidocaine 4%, Treatment Notes Wound #10 (Left, Medial Calcaneus) 1. Cleansed with: Cleanse wound with antibacterial soap and water 4. Dressing Applied: Aquacel Ag 5. Secondary Dressing Applied ABD and Kerlix/Conform 6. Footwear/Offloading device applied Other footwear/offloading device applied (specify in notes) 7. Secured with Tape Notes Patient to wear Environmental consultant) Signed: 09/05/2016 4:40:29 PM By: Regan Lemming BSN, RN Entered By: Regan Lemming on 09/05/2016 09:16:47 [name] ([name] ([id number]) -------------------------------------------------------------------------------- Wound Assessment Details Patient Name: [name] Date of Service: 09/05/2016 9:00 AM Medical Record Patient Account Number: [id number] Number: Treating RN: Afful, RN, BSN, Rita 05/17/1936 [phone] y.o. Other Clinician: Date of Birth/Sex: Female) Treating ROBSON, Percival Primary  Care Tamarra Geiselman: Halina Maidens Theo Reither/Extender: G Referring Yosgart Pavey: Evert Kohl in Treatment: 0 Wound Status Wound Number: 10 Primary Etiology: Pressure Ulcer Wound Location: Left, Medial Calcaneus Wound Status: Healed - Epithelialized Wounding Event: Blister Date Acquired: 09/04/2016 Weeks Of Treatment: 0 Clustered Wound: No Photos Photo Uploaded By: Regan Lemming on 09/05/2016 16:26:56 Wound Measurements Length: (cm) Width: (cm) Depth: (cm) Area: (cm) Volume: (cm) 0 % Reduction in Area: 0 % Reduction in Volume: 0 0 0 Periwound Skin Texture Texture Color No Abnormalities Noted: No No Abnormalities Noted: No Moisture No Abnormalities Noted: No Electronic Signature(s) Signed: 09/05/2016 4:40:29 PM By: Regan Lemming BSN, RN Entered By: Regan Lemming on 09/05/2016 09:34:40 [name] ([name] ([id number]) -------------------------------------------------------------------------------- Vitals Details Patient Name: [name] Date of Service: 09/05/2016 9:00 AM Medical Record Patient Account Number: [id number] Number: Treating RN: Afful, RN, BSN, Rita 10-Jun-1936 [phone] y.o. Other Clinician: Date of Birth/Sex: Female) Treating ROBSON, Chauncey Primary Care Donnae Michels: Army Melia,  Mickel Baas Anjela Cassara/Extender: G Referring Daylen Lipsky: Evert Kohl in Treatment: 0 Vital Signs Time Taken: 09:09 Temperature (F): 98 Height (in): 64 Pulse (bpm): 71 Source: Stated Respiratory Rate (breaths/min): 18 Weight (lbs): 100 Blood Pressure (mmHg): 144/64 Source: Stated Reference Range: 80 - 120 mg / dl Body Mass Index (BMI): 17.2 Electronic Signature(s) Signed: 09/05/2016 4:40:29 PM By: Regan Lemming BSN, RN Entered By: Regan Lemming on 09/05/2016 09:12:40

## 2016-09-06 DIAGNOSIS — I5020 Unspecified systolic (congestive) heart failure: Secondary | ICD-10-CM | POA: Diagnosis not present

## 2016-09-06 DIAGNOSIS — J9611 Chronic respiratory failure with hypoxia: Secondary | ICD-10-CM | POA: Diagnosis not present

## 2016-09-06 DIAGNOSIS — G893 Neoplasm related pain (acute) (chronic): Secondary | ICD-10-CM | POA: Diagnosis not present

## 2016-09-06 DIAGNOSIS — G308 Other Alzheimer's disease: Secondary | ICD-10-CM | POA: Diagnosis not present

## 2016-09-06 DIAGNOSIS — C787 Secondary malignant neoplasm of liver and intrahepatic bile duct: Secondary | ICD-10-CM | POA: Diagnosis not present

## 2016-09-06 DIAGNOSIS — C259 Malignant neoplasm of pancreas, unspecified: Secondary | ICD-10-CM | POA: Diagnosis not present

## 2016-09-06 NOTE — Progress Notes (Signed)
JOYLYN, DUGGIN ([id number]) Visit Report for 09/05/2016 Chief Complaint Document Details Patient Name: [name], [name]. Date of Service: 09/05/2016 9:00 AM Medical Record Patient Account Number: [id number] Number: Treating RN: Afful, RN, BSN, Rita 1936/05/04 [phone] y.o. Other Clinician: Date of Birth/Sex: Female) Treating Alyrica Thurow Primary Care Provider: Halina Maidens Provider/Extender: G Referring Provider: Evert Kohl in Treatment: 0 Information Obtained from: Patient Chief Complaint Patient is at the clinic for treatment of an open pressure ulcer to her right calcaneum which has been there for about 4 weeks 09/05/16 Patient is here for review of left medial heel ulcer Electronic Signature(s) Signed: 09/06/2016 7:58:45 AM By: Linton Ham MD Entered By: Linton Ham on 09/05/2016 09:58:08 [name] ([id number]) -------------------------------------------------------------------------------- HPI Details Patient Name: [name] Date of Service: 09/05/2016 9:00 AM Medical Record Patient Account Number: [id number] Number: Treating RN: Afful, RN, BSN, Rita 1936-01-06 [phone] y.o. Other Clinician: Date of Birth/Sex: Female) Treating Melania Kirks Primary Care Provider: Halina Maidens Provider/Extender: G Referring Provider: Evert Kohl in Treatment: 0 History of Present Illness Location: right posterior heel, and both lower extremities Quality: Patient reports experiencing a sharp pain to affected area(s). Severity: Patient states wound (s) are getting better. Duration: Patient has had the wound for < 4 weeks prior to presenting for treatment Timing: Pain in wound is Intermittent (comes and goes Context: The wound appeared gradually over time while she was in hospital with a right hip fracture Modifying Factors: Consults to this date include:was seen by her PCP and put on some antibiotics a while ago Associated  Signs and Symptoms: Patient reports having difficulty standing for long periods. HPI Description: 81 year old patient referred who was recently discharged in April of this year with a left heel ulcer now comes with a right heel ulcer and some ulcerations in both lower extremities which have all come along during her recent hospitalization which she had a right hip fracture. She has a past medical history of COPD, diabetes mellitus type 2 with chronic kidney disease, multi-infarct dementia, anxiety, urinary retention, paroxysmal atrial fibrillation. She has also had moderate malnutrition, multi-infarct dementia, congestive heart failure, left displaced femoral neck fracture, systemic lupus erythematosus. She is also status total abdominal hysterectomy with bilateral salpingo-oophorectomy, EGDs, hip arthroplasty on the left, cataract surgery. She has been a from a smoker and quit in May 2013 and she smoked for about 40 years. recently admitted to [facility] between January 19 and 07/03/2015 12/09/2015 -- o right foot x-ray -- IMPRESSION:Soft tissue ulceration of the heel. No underlying erosive bony abnormality. Diffuse degenerative change. No acute bony abnormality. 12/30/2015-- admitted to the hospital between July 12 and July 18 of 2017 and was treated for cerebral infarction, infected decubitus ulcer and aspiration pneumonia of the right lung. MRI was negative for stroke and was thought to be secondary to pneumonia. She was treated with clindamycin. Patient was discharged home on Keflex and Zyvox. Dr. Lucky Cowboy took her for an aortogram and a right lower extremity runoff and found to 30% stenosis in the mid to distal SFA but no significant stenosis in the popliteal artery and the SFA. There was a two-vessel runoff through the anterior tibial artery and peroneal artery without significant stenosis. felt her perfusion was adequate for wound healing. 02/28/2016 -- days ago she had  a lacerated wound on her left forearm which they have been treating locally and have asked me to take a look today. 03/13/16: returns today for f/u. the right  posterior heel wound has achieved epithelialization. the left forearm wound is healing without issue. no systemic s/s of infection. ANTONELLA, UPSON ([id number]) 03/20/16: returns today for f/u of a left forearm wound. husband reports dressing slides down. no reports of systemic s/s of infection. no new wounds or skin breakdown. 04/03/2016 -- she has had another laceration with a skin flap on the left upper arm and she also has one on the anterior part of the left forearm. 05/08/2016 -- this past Friday the dressing was soupy and moist so the husband changed it and he found that there was a lot of surrounding drainage and hence has been changing the dressing and asked to the seen today READMISSION: 09/05/16; This is a frail 81 yo woman who was previously in this clinic with predominantly wounds on her bilateral heels. Patient has dementia as well as type 2 dm on oral agents. Unfortunately she was diagnosed with metastatic pancreatic cancer to the liver presenting in January/18 with RUQ pain and jaundice. She has a CBD stent in place. Her family reports she eats poorly and is no longer ambulatory. She under the care of hosp[facility] of [place]. Her husband tells me that he noted a blister on the left heel 24 hrs ago. He opened this. There was a consider able amount of drainage which wa clear. They called urgently to be seen this AM Electronic Signature(s) Signed: 09/06/2016 7:58:45 AM By: Linton Ham MD Entered By: Linton Ham on 09/05/2016 10:04:05 [name] ([id number]) -------------------------------------------------------------------------------- Physical Exam Details Patient Name: [name] Date of Service: 09/05/2016 9:00 AM Medical Record Patient Account Number: [id number] Number: Treating RN: Afful,  RN, BSN, Rita 1936-04-09 [phone] y.o. Other Clinician: Date of Birth/Sex: Female) Treating Duwane Gewirtz Primary Care Provider: Halina Maidens Provider/Extender: G Referring Provider: Evert Kohl in Treatment: 0 Constitutional Patient is hypertensive.. Pulse regular and within target range for patient.Marland Kitchen Respirations regular, non-labored and within target range.. Temperature is normal and within the target range for the patient.. Frail patient but allert and conversational. Eyes Conjunctivae clear. No discharge.no icterus. Respiratory Respiratory effort is easy and symmetric bilaterally. Rate is normal at rest and on room air.. sahllow a/e but no wheezes. WOB is normal. Cardiovascular Heart rhythm and rate regular, without murmur or gallop.. Pedal pulses palpable and strong bilaterally.. Gastrointestinal (GI) significant epigastric tenderness. No liver or spleen. Lymphatic poopliteal or inguinal. Integumentary (Hair, Skin) not jaudiced. Psychiatric Patient appears somewhat anxious. Notes wound exam; Patient has an open area on the medical aspect of the left heel where the husband opened the blister. Just under this there is still intact epithelium but with dark subcutaneous tissue. I did not remove this preferring to wait to see if meticulous pressure relief does to the appearance of this. patient also has a stage 1 area on the lateral aspect of the right heel . ALSO stage 1 areas on the mirror image areas of the medical upper buttocks. Electronic Signature(s) Signed: 09/06/2016 7:58:45 AM By: Linton Ham MD Entered By: Linton Ham on 09/05/2016 10:11:23 [name] ([id number]) -------------------------------------------------------------------------------- Physician Orders Details Patient Name: [name] Date of Service: 09/05/2016 9:00 AM Medical Record Patient Account Number: [id number] Number: Treating RN: Afful, RN, BSN,  Rita 04/08/1936 [phone] y.o. Other Clinician: Date of Birth/Sex: Female) Treating Shane Badeaux Primary Care Provider: Halina Maidens Provider/Extender: G Referring Provider: Evert Kohl in Treatment: 0 Verbal / Phone Orders: No Diagnosis Coding Wound Cleansing Wound #10 Left,Medial Calcaneus o Cleanse wound  with mild soap and water Anesthetic Wound #10 Left,Medial Calcaneus o Topical Lidocaine 4% cream applied to wound bed prior to debridement - in clinic Primary Wound Dressing Wound #10 Left,Medial Calcaneus o Aquacel Ag Secondary Dressing Wound #10 Left,Medial Calcaneus o ABD and Kerlix/Conform Dressing Change Frequency Wound #10 Left,Medial Calcaneus o Change dressing every other day. Follow-up Appointments Wound #10 Left,Medial Calcaneus o Return Appointment in 1 week. Edema Control Wound #10 Left,Medial Calcaneus o Elevate legs to the level of the heart and pump ankles as often as possible Off-Loading Wound #10 Left,Medial Calcaneus o Heel suspension boot to: - Sage/bunny boots on at all times/as tolerated o Mattress - HOSPICE TO PROVIDE PRESSURE RELIEVE SURFACE ANAISHA, MAGO ([id number]) Additional Orders / Instructions Wound #10 Left,Medial Calcaneus o Increase protein intake. o Activity as tolerated Home Health Wound #10 [place] Visits - [place] Nurse may visit PRN to address patientos wound care needs. o FACE TO FACE ENCOUNTER: MEDICARE and MEDICAID PATIENTS: I certify that this patient is under my care and that I had a face-to-face encounter that meets the physician face-to-face encounter requirements with this patient on this date. The encounter with the patient was in whole or in part for the following MEDICAL CONDITION: (primary reason for [place]) MEDICAL NECESSITY: I certify, that based on my findings, NURSING services are a  medically necessary home health service. HOME BOUND STATUS: I certify that my clinical findings support that this patient is homebound (i.e., Due to illness or injury, pt requires aid of supportive devices such as crutches, cane, wheelchairs, walkers, the use of special transportation or the assistance of another person to leave their place of residence. There is a normal inability to leave the home and doing so requires considerable and taxing effort. Other absences are for medical reasons / religious services and are infrequent or of short duration when for other reasons). o If current dressing causes regression in wound condition, may D/C ordered dressing product/s and apply Normal Saline Moist Dressing daily until next [place] / Other MD appointment. [place] of regression in wound condition at [phone]. o Please direct any NON-WOUND related issues/requests for orders to patient's Primary Care Physician Laboratory o Bacteria identified in Wound by Culture (MICRO) - LEFT HEEL oooo LOINC Code: 8841-6 oooo Convenience Name: Wound culture routine Electronic Signature(s) Signed: 09/05/2016 4:40:29 PM By: Regan Lemming BSN, RN Signed: 09/06/2016 7:58:45 AM By: Linton Ham MD Entered By: Regan Lemming on 09/05/2016 09:51:59 [name] ([id number]) -------------------------------------------------------------------------------- Problem List Details Patient Name: [name]. Date of Service: 09/05/2016 9:00 AM Medical Record Patient Account Number: [id number] Number: Treating RN: Afful, RN, BSN, Rita 1935-06-24 [phone] y.o. Other Clinician: Date of Birth/Sex: Female) Treating Travonna Swindle Primary Care Provider: Halina Maidens Provider/Extender: G Referring Provider: Evert Kohl in Treatment: 0 Active Problems ICD-10 Encounter Code Description Active Date Diagnosis L89.620 Pressure ulcer of left heel,  unstageable 09/05/2016 Yes L89.611 Pressure ulcer of right heel, stage 1 09/05/2016 Yes L89.311 Pressure ulcer of right buttock, stage 1 09/05/2016 Yes L89.321 Pressure ulcer of left buttock, stage 1 09/05/2016 Yes Inactive Problems Resolved Problems Electronic Signature(s) Signed: 09/06/2016 7:58:45 AM By: Linton Ham MD Entered By: Linton Ham on 09/05/2016 09:56:16 [name] ([id number]) -------------------------------------------------------------------------------- Progress Note Details Patient Name: [name] Date of Service: 09/05/2016 9:00 AM Medical Record Patient Account Number: [id number] Number: Treating RN: Afful, RN, BSN, [name] [birth year] [phone]  y.o. Other Clinician: Date of Birth/Sex: Female) Treating Tarrance Januszewski Primary Care Provider: Halina Maidens Provider/Extender: G Referring Provider: Evert Kohl in Treatment: 0 Subjective Chief Complaint Information obtained from Patient Patient is at the clinic for treatment of an open pressure ulcer to her right calcaneum which has been there for about 4 weeks 09/05/16 Patient is here for review of left medial heel ulcer History of Present Illness (HPI) The following HPI elements were documented for the patient's wound: Location: right posterior heel, and both lower extremities Quality: Patient reports experiencing a sharp pain to affected area(s). Severity: Patient states wound (s) are getting better. Duration: Patient has had the wound for < 4 weeks prior to presenting for treatment Timing: Pain in wound is Intermittent (comes and goes Context: The wound appeared gradually over time while she was in hospital with a right hip fracture Modifying Factors: Consults to this date include:was seen by her PCP and put on some antibiotics a while ago Associated Signs and Symptoms: Patient reports having difficulty standing for long periods. 81 year old patient referred who was recently  discharged in April of this year with a left heel ulcer now comes with a right heel ulcer and some ulcerations in both lower extremities which have all come along during her recent hospitalization which she had a right hip fracture. She has a past medical history of COPD, diabetes mellitus type 2 with chronic kidney disease, multi-infarct dementia, anxiety, urinary retention, paroxysmal atrial fibrillation. She has also had moderate malnutrition, multi-infarct dementia, congestive heart failure, left displaced femoral neck fracture, systemic lupus erythematosus. She is also status total abdominal hysterectomy with bilateral salpingo-oophorectomy, EGDs, hip arthroplasty on the left, cataract surgery. She has been a from a smoker and quit in May 2013 and she smoked for about 40 years. recently admitted to [facility] between January 19 and 07/03/2015 12/09/2015 -- right foot x-ray -- IMPRESSION:Soft tissue ulceration of the heel. No underlying erosive bony abnormality. Diffuse degenerative change. No acute bony abnormality. 12/30/2015-- admitted to the hospital between July 12 and July 18 of 2017 and was treated for cerebral infarction, infected decubitus ulcer and aspiration pneumonia of the right lung. MRI was negative for stroke and was thought to be secondary to pneumonia. She was treated with clindamycin. Patient was discharged home on Keflex and Zyvox. [name] ([id number]) Dr. Lucky Cowboy took her for an aortogram and a right lower extremity runoff and found to 30% stenosis in the mid to distal SFA but no significant stenosis in the popliteal artery and the SFA. There was a two-vessel runoff through the anterior tibial artery and peroneal artery without significant stenosis. felt her perfusion was adequate for wound healing. 02/28/2016 -- days ago she had a lacerated wound on her left forearm which they have been treating locally and have asked me to take a look  today. 03/13/16: returns today for f/u. the right posterior heel wound has achieved epithelialization. the left forearm wound is healing without issue. no systemic s/s of infection. 03/20/16: returns today for f/u of a left forearm wound. husband reports dressing slides down. no reports of systemic s/s of infection. no new wounds or skin breakdown. 04/03/2016 -- she has had another laceration with a skin flap on the left upper arm and she also has one on the anterior part of the left forearm. 05/08/2016 -- this past Friday the dressing was soupy and moist so the husband changed it and he found that there was a lot of surrounding  drainage and hence has been changing the dressing and asked to the seen today READMISSION: 09/05/16; This is a frail 81 yo woman who was previously in this clinic with predominantly wounds on her bilateral heels. Patient has dementia as well as type 2 dm on oral agents. Unfortunately she was diagnosed with metastatic pancreatic cancer to the liver presenting in January/18 with RUQ pain and jaundice. She has a CBD stent in place. Her family reports she eats poorly and is no longer ambulatory. She under the care of [facility] of [place]. Her husband tells me that he noted a blister on the left heel 24 hrs ago. He opened this. There was a consider able amount of drainage which wa clear. They called urgently to be seen this AM Wound History Patient presents with 1 open wound that has been present for approximately 24hrs. Patient has been treating wound in the following manner: dry dressing. Laboratory tests have not been performed in the last month. Patient reportedly has not tested positive for an antibiotic resistant organism. Patient reportedly has not tested positive for osteomyelitis. Patient reportedly has not had testing performed to evaluate circulation in the legs. Patient History Information obtained from Patient. Allergies codeine, penicillin, Sulfa (Sulfonamide  Antibiotics), doxycycline, erythromycin base, latex Family History Hypertension - Father, Mother, No family history of Cancer, Diabetes, Heart Disease, Hereditary Spherocytosis, Kidney Disease, Lung Disease, Seizures, Stroke, Thyroid Problems, Tuberculosis. Social History Former smoker - 4 years ago, Marital Status - Married, Alcohol Use - Never, Drug Use - No History, JILLISA, HARRIS. ([id number]) Caffeine Use - Never. Medical History Endocrine Patient has history of Type II Diabetes Patient is treated with Oral Agents. Blood sugar is not tested. Medical And Surgical History Notes Neurologic raynauds Review of Systems (ROS) Eyes The patient has no complaints or symptoms. Ear/Nose/Mouth/Throat The patient has no complaints or symptoms. Respiratory The patient has no complaints or symptoms. Cardiovascular Complains or has symptoms of LE edema. Gastrointestinal The patient has no complaints or symptoms. Endocrine The patient has no complaints or symptoms. Genitourinary The patient has no complaints or symptoms. Immunological The patient has no complaints or symptoms. Integumentary (Skin) Complains or has symptoms of Wounds. Musculoskeletal The patient has no complaints or symptoms. Neurologic The patient has no complaints or symptoms. Oncologic The patient has no complaints or symptoms. Psychiatric The patient has no complaints or symptoms. Objective Constitutional Patient is hypertensive.. Pulse regular and within target range for patient.Marland Kitchen Respirations regular, non-labored and within target range.. Temperature is normal and within the target range for the patient.. Frail patient but KAYLIEGH, BOYERS ([id number]) allert and conversational. Vitals Time Taken: 9:09 AM, Height: 64 in, Source: Stated, Weight: 100 lbs, Source: Stated, BMI: 17.2, Temperature: 98 F, Pulse: 71 bpm, Respiratory Rate: 18 breaths/min, Blood Pressure: 144/64 mmHg. Eyes Conjunctivae clear. No  discharge.no icterus. Respiratory Respiratory effort is easy and symmetric bilaterally. Rate is normal at rest and on room air.. sahllow a/e but no wheezes. WOB is normal. Cardiovascular Heart rhythm and rate regular, without murmur or gallop.. Pedal pulses palpable and strong bilaterally.. Gastrointestinal (GI) significant epigastric tenderness. No liver or spleen. Lymphatic poopliteal or inguinal. Psychiatric Patient appears somewhat anxious. General Notes: wound exam; Patient has an open area on the medical aspect of the left heel where the husband opened the blister. Just under this there is still intact epithelium but with dark subcutaneous tissue. I did not remove this preferring to wait to see if meticulous pressure relief does to the appearance of this.  patient also has a stage 1 area on the lateral aspect of the right heel . ALSO stage 1 areas on the mirror image areas of the medical upper buttocks. Integumentary (Hair, Skin) not jaudiced. Wound #10 status is Open. Original cause of wound was Blister. The wound is located on the Left,Medial Calcaneus. The wound measures 4.5cm length x 4cm width x 0.1cm depth; 14.137cm^2 area and 1.414cm^3 volume. There is Fat Layer (Subcutaneous Tissue) Exposed exposed. There is no tunneling or undermining noted. There is a small amount of serosanguineous drainage noted. The wound margin is distinct with the outline attached to the wound base. There is no granulation within the wound bed. There is a large (67-100%) amount of necrotic tissue within the wound bed including Eschar and Adherent Slough. The periwound skin appearance did not exhibit: Callus, Crepitus, Excoriation, Induration, Rash, Scarring, Dry/Scaly, Maceration, Atrophie Blanche, Cyanosis, Ecchymosis, Hemosiderin Staining, Mottled, Pallor, Rubor, Erythema. Periwound temperature was noted as No Abnormality. The periwound has tenderness on palpation. Wound #10 status is Healed -  Epithelialized. Original cause of wound was Blister. The wound is located on the Left,Medial Calcaneus. The wound measures 0cm length x 0cm width x 0cm depth; 0cm^2 area and 0cm^3 volume. [name] ([id number]) Assessment Active Problems ICD-10 [phone] - Pressure ulcer of left heel, unstageable L89.611 - Pressure ulcer of right heel, stage 1 L89.311 - Pressure ulcer of right buttock, stage 1 L89.321 - Pressure ulcer of left buttock, stage 1 Plan Wound Cleansing: Wound #10 Left,Medial Calcaneus: Cleanse wound with mild soap and water Anesthetic: Wound #10 Left,Medial Calcaneus: Topical Lidocaine 4% cream applied to wound bed prior to debridement - in clinic Primary Wound Dressing: Wound #10 Left,Medial Calcaneus: Aquacel Ag Secondary Dressing: Wound #10 Left,Medial Calcaneus: ABD and Kerlix/Conform Dressing Change Frequency: Wound #10 Left,Medial Calcaneus: Change dressing every other day. Follow-up Appointments: Wound #10 Left,Medial Calcaneus: Return Appointment in 1 week. Edema Control: Wound #10 Left,Medial Calcaneus: Elevate legs to the level of the heart and pump ankles as often as possible Off-Loading: Wound #10 Left,Medial Calcaneus: Heel suspension boot to: - Sage/bunny boots on at all times/as tolerated Mattress - HOSPICE TO PROVIDE PRESSURE RELIEVE SURFACE Additional Orders / Instructions: Wound #10 Left,Medial Calcaneus: Increase protein intake. Activity as tolerated Home Health: Wound #10 Left,Medial Calcaneus: MAKINSEY, PEPITONE ([id number]) [place] Visits - [place] Nurse may visit PRN to address patient s wound care needs. FACE TO FACE ENCOUNTER: MEDICARE and MEDICAID PATIENTS: I certify that this patient is under my care and that I had a face-to-face encounter that meets the physician face-to-face encounter requirements with this patient on this date. The encounter with the patient was in whole or in part for  the following MEDICAL CONDITION: (primary reason for [place]) MEDICAL NECESSITY: I certify, that based on my findings, NURSING services are a medically necessary home health service. HOME BOUND STATUS: I certify that my clinical findings support that this patient is homebound (i.e., Due to illness or injury, pt requires aid of supportive devices such as crutches, cane, wheelchairs, walkers, the use of special transportation or the assistance of another person to leave their place of residence. There is a normal inability to leave the home and doing so requires considerable and taxing effort. Other absences are for medical reasons / religious services and are infrequent or of short duration when for other reasons). If current dressing causes regression in wound condition, may D/C ordered dressing product/s and apply Normal Saline Moist Dressing  daily until next [place] / Other MD appointment. [place] of regression in wound condition at [phone]. Please direct any NON-WOUND related issues/requests for orders to patient's Primary Care Physician Laboratory ordered were: Wound culture routine - LEFT HEEL 1) to the left heel Aquacel AG,heel cup gauze 2) She needs attention to offloading. They have bunny boots at home and I have encouraged them to use these 3 Might benifit from a mattress surface if hospice can provide 4 Suggested a in home aide to turn her at night. They have someone currently during the day. Having someone present at night might be more useful 5) C+S done of the surface of the left heel no emperic AB's 6) they will return to see Dr. Con Memos next wk. Patient looks like she is begining to fail. Electronic Signature(s) Signed: 09/06/2016 7:58:45 AM By: Linton Ham MD Entered By: Linton Ham on 09/05/2016 10:16:03 [name] ([id number]) -------------------------------------------------------------------------------- ROS/PFSH  Details Patient Name: [name] Date of Service: 09/05/2016 9:00 AM Medical Record Patient Account Number: [id number] Number: Treating RN: Afful, RN, BSN, Rita 04-13-1936 [phone] y.o. Other Clinician: Date of Birth/Sex: Female) Treating Jaydien Panepinto, [place] Primary Care Provider: Halina Maidens Provider/Extender: G Referring Provider: Evert Kohl in Treatment: 0 Information Obtained From Patient Wound History Do you currently have one or more open woundso Yes How many open wounds do you currently haveo 1 Approximately how long have you had your woundso 24hrs How have you been treating your wound(s) until nowo dry dressing Has your wound(s) ever healed and then re-openedo No Have you had any lab work done in the past montho No Have you tested positive for an antibiotic resistant organism (MRSA, VRE)o No Have you tested positive for osteomyelitis (bone infection)o No Have you had any tests for circulation on your legso No Cardiovascular Complaints and Symptoms: Positive for: LE edema Medical History: Positive for: Angina - a fib; Congestive Heart Failure Integumentary (Skin) Complaints and Symptoms: Positive for: Wounds Medical History: Negative for: History of Burn Eyes Complaints and Symptoms: No Complaints or Symptoms Medical History: Negative for: Cataracts; Glaucoma; Optic Neuritis Ear/Nose/Mouth/Throat MARCINE, GADWAY ([id number]) Complaints and Symptoms: No Complaints or Symptoms Medical History: Negative for: Chronic sinus problems/congestion; Middle ear problems Hematologic/Lymphatic Medical History: Negative for: Anemia; Hemophilia; Human Immunodeficiency Virus; Lymphedema; Sickle Cell Disease Respiratory Complaints and Symptoms: No Complaints or Symptoms Medical History: Positive for: Chronic Obstructive Pulmonary Disease (COPD) Gastrointestinal Complaints and Symptoms: No Complaints or Symptoms Medical History: Negative for:  Cirrhosis ; Colitis; Crohnos; Hepatitis A; Hepatitis B; Hepatitis C Endocrine Complaints and Symptoms: No Complaints or Symptoms Medical History: Positive for: Type II Diabetes Time with diabetes: years Treated with: Oral agents Blood sugar tested every day: No Genitourinary Complaints and Symptoms: No Complaints or Symptoms Medical History: Negative for: End Stage Renal Disease Immunological Complaints and Symptoms: No Complaints or Symptoms SHALEAH, NISSLEY ([id number]) Medical History: Positive for: Lupus Erythematosus - SLE Musculoskeletal Complaints and Symptoms: No Complaints or Symptoms Medical History: Positive for: Osteoarthritis Negative for: Gout; Rheumatoid Arthritis; Osteomyelitis Neurologic Complaints and Symptoms: No Complaints or Symptoms Medical History: Positive for: Dementia - infarct Negative for: Neuropathy; Quadriplegia; Paraplegia; Seizure Disorder Past Medical History Notes: raynauds Oncologic Complaints and Symptoms: No Complaints or Symptoms Medical History: Negative for: Received Chemotherapy; Received Radiation Psychiatric Complaints and Symptoms: No Complaints or Symptoms Medical History: Negative for: Anorexia/bulimia; Confinement Anxiety Immunizations Pneumococcal Vaccine: Received Pneumococcal Vaccination: No Family and Social History Cancer: No; Diabetes: No; Heart  Disease: No; Hereditary Spherocytosis: No; Hypertension: Yes - Father, Mother; Kidney Disease: No; Lung Disease: No; Seizures: No; Stroke: No; Thyroid Problems: No; Tuberculosis: No; Former smoker - 4 years ago; Marital Status - Married; Alcohol Use: Never; Drug Use: No History; Caffeine Use: Never; Financial Concerns: No; Food, Clothing or Shelter Needs: No; Support System Lacking: No; Transportation Concerns: No; Advanced Directives: Yes (Not Provided); Patient does not want information on Advanced Directives; Living Will: Yes (Not Provided); Medical Power of  Attorney: Yes (Not Provided) LIARA, HOLM ([id number]) Electronic Signature(s) Signed: 09/05/2016 4:40:29 PM By: Regan Lemming BSN, RN Signed: 09/06/2016 7:58:45 AM By: Linton Ham MD Entered By: Regan Lemming on 09/05/2016 09:09:37 [name] ([id number]) -------------------------------------------------------------------------------- SuperBill Details Patient Name: [name] Date of Service: 09/05/2016 Medical Record Patient Account Number: [id number] Number: Treating RN: Baruch Gouty, RN, BSN, Rita 1935-06-28 [phone] y.o. Other Clinician: Date of Birth/Sex: Female) Treating Clide Remmers Primary Care Provider: Halina Maidens Provider/Extender: G Referring Provider: Evert Kohl in Treatment: 0 Diagnosis Coding ICD-10 Codes Code Description L89.620 Pressure ulcer of left heel, unstageable L89.611 Pressure ulcer of right heel, stage 1 L89.311 Pressure ulcer of right buttock, stage 1 L89.321 Pressure ulcer of left buttock, stage 1 Facility Procedures CPT4 Code: [id number] Description: 99214 - WOUND CARE VISIT-LEV 4 EST PT Modifier: Quantity: 1 Physician Procedures CPT4 Code: 9977414 Description: 23953 - WC PHYS LEVEL 4 - EST PT ICD-10 Description Diagnosis L89.620 Pressure ulcer of left heel, unstageable L89.611 Pressure ulcer of right heel, stage 1 L89.311 Pressure ulcer of right buttock, stage 1 Modifier: Quantity: 1 Electronic Signature(s) Signed: 09/06/2016 7:58:45 AM By: Linton Ham MD Entered By: Linton Ham on 09/05/2016 10:16:28

## 2016-09-07 DIAGNOSIS — J9611 Chronic respiratory failure with hypoxia: Secondary | ICD-10-CM | POA: Diagnosis not present

## 2016-09-07 DIAGNOSIS — C787 Secondary malignant neoplasm of liver and intrahepatic bile duct: Secondary | ICD-10-CM | POA: Diagnosis not present

## 2016-09-07 DIAGNOSIS — C259 Malignant neoplasm of pancreas, unspecified: Secondary | ICD-10-CM | POA: Diagnosis not present

## 2016-09-07 DIAGNOSIS — I5020 Unspecified systolic (congestive) heart failure: Secondary | ICD-10-CM | POA: Diagnosis not present

## 2016-09-07 DIAGNOSIS — G308 Other Alzheimer's disease: Secondary | ICD-10-CM | POA: Diagnosis not present

## 2016-09-07 DIAGNOSIS — G893 Neoplasm related pain (acute) (chronic): Secondary | ICD-10-CM | POA: Diagnosis not present

## 2016-09-08 LAB — AEROBIC CULTURE  (SUPERFICIAL SPECIMEN)

## 2016-09-08 LAB — AEROBIC CULTURE W GRAM STAIN (SUPERFICIAL SPECIMEN): Culture: NORMAL

## 2016-09-09 DIAGNOSIS — G893 Neoplasm related pain (acute) (chronic): Secondary | ICD-10-CM | POA: Diagnosis not present

## 2016-09-09 DIAGNOSIS — I5020 Unspecified systolic (congestive) heart failure: Secondary | ICD-10-CM | POA: Diagnosis not present

## 2016-09-09 DIAGNOSIS — G308 Other Alzheimer's disease: Secondary | ICD-10-CM | POA: Diagnosis not present

## 2016-09-09 DIAGNOSIS — C259 Malignant neoplasm of pancreas, unspecified: Secondary | ICD-10-CM | POA: Diagnosis not present

## 2016-09-09 DIAGNOSIS — C787 Secondary malignant neoplasm of liver and intrahepatic bile duct: Secondary | ICD-10-CM | POA: Diagnosis not present

## 2016-09-09 DIAGNOSIS — J9611 Chronic respiratory failure with hypoxia: Secondary | ICD-10-CM | POA: Diagnosis not present

## 2016-09-10 DIAGNOSIS — K219 Gastro-esophageal reflux disease without esophagitis: Secondary | ICD-10-CM | POA: Diagnosis not present

## 2016-09-10 DIAGNOSIS — I5020 Unspecified systolic (congestive) heart failure: Secondary | ICD-10-CM | POA: Diagnosis not present

## 2016-09-10 DIAGNOSIS — F419 Anxiety disorder, unspecified: Secondary | ICD-10-CM | POA: Diagnosis not present

## 2016-09-10 DIAGNOSIS — M1990 Unspecified osteoarthritis, unspecified site: Secondary | ICD-10-CM | POA: Diagnosis not present

## 2016-09-10 DIAGNOSIS — C259 Malignant neoplasm of pancreas, unspecified: Secondary | ICD-10-CM | POA: Diagnosis not present

## 2016-09-10 DIAGNOSIS — I248 Other forms of acute ischemic heart disease: Secondary | ICD-10-CM | POA: Diagnosis not present

## 2016-09-10 DIAGNOSIS — J9611 Chronic respiratory failure with hypoxia: Secondary | ICD-10-CM | POA: Diagnosis not present

## 2016-09-10 DIAGNOSIS — M329 Systemic lupus erythematosus, unspecified: Secondary | ICD-10-CM | POA: Diagnosis not present

## 2016-09-10 DIAGNOSIS — E440 Moderate protein-calorie malnutrition: Secondary | ICD-10-CM | POA: Diagnosis not present

## 2016-09-10 DIAGNOSIS — C787 Secondary malignant neoplasm of liver and intrahepatic bile duct: Secondary | ICD-10-CM | POA: Diagnosis not present

## 2016-09-10 DIAGNOSIS — F0280 Dementia in other diseases classified elsewhere without behavioral disturbance: Secondary | ICD-10-CM | POA: Diagnosis not present

## 2016-09-10 DIAGNOSIS — I7300 Raynaud's syndrome without gangrene: Secondary | ICD-10-CM | POA: Diagnosis not present

## 2016-09-10 DIAGNOSIS — M47816 Spondylosis without myelopathy or radiculopathy, lumbar region: Secondary | ICD-10-CM | POA: Diagnosis not present

## 2016-09-10 DIAGNOSIS — E039 Hypothyroidism, unspecified: Secondary | ICD-10-CM | POA: Diagnosis not present

## 2016-09-10 DIAGNOSIS — R339 Retention of urine, unspecified: Secondary | ICD-10-CM | POA: Diagnosis not present

## 2016-09-10 DIAGNOSIS — K279 Peptic ulcer, site unspecified, unspecified as acute or chronic, without hemorrhage or perforation: Secondary | ICD-10-CM | POA: Diagnosis not present

## 2016-09-10 DIAGNOSIS — G308 Other Alzheimer's disease: Secondary | ICD-10-CM | POA: Diagnosis not present

## 2016-09-10 DIAGNOSIS — G893 Neoplasm related pain (acute) (chronic): Secondary | ICD-10-CM | POA: Diagnosis not present

## 2016-09-10 DIAGNOSIS — R251 Tremor, unspecified: Secondary | ICD-10-CM | POA: Diagnosis not present

## 2016-09-10 DIAGNOSIS — G629 Polyneuropathy, unspecified: Secondary | ICD-10-CM | POA: Diagnosis not present

## 2016-09-10 DIAGNOSIS — I480 Paroxysmal atrial fibrillation: Secondary | ICD-10-CM | POA: Diagnosis not present

## 2016-09-11 ENCOUNTER — Encounter: Attending: Surgery | Admitting: Surgery

## 2016-09-11 DIAGNOSIS — Z87891 Personal history of nicotine dependence: Secondary | ICD-10-CM | POA: Diagnosis not present

## 2016-09-11 DIAGNOSIS — L89311 Pressure ulcer of right buttock, stage 1: Secondary | ICD-10-CM | POA: Insufficient documentation

## 2016-09-11 DIAGNOSIS — I509 Heart failure, unspecified: Secondary | ICD-10-CM | POA: Insufficient documentation

## 2016-09-11 DIAGNOSIS — L89611 Pressure ulcer of right heel, stage 1: Secondary | ICD-10-CM | POA: Insufficient documentation

## 2016-09-11 DIAGNOSIS — M329 Systemic lupus erythematosus, unspecified: Secondary | ICD-10-CM | POA: Insufficient documentation

## 2016-09-11 DIAGNOSIS — L89620 Pressure ulcer of left heel, unstageable: Secondary | ICD-10-CM | POA: Diagnosis not present

## 2016-09-11 DIAGNOSIS — Z882 Allergy status to sulfonamides status: Secondary | ICD-10-CM | POA: Diagnosis not present

## 2016-09-11 DIAGNOSIS — Z885 Allergy status to narcotic agent status: Secondary | ICD-10-CM | POA: Diagnosis not present

## 2016-09-11 DIAGNOSIS — I7300 Raynaud's syndrome without gangrene: Secondary | ICD-10-CM | POA: Insufficient documentation

## 2016-09-11 DIAGNOSIS — Z681 Body mass index (BMI) 19 or less, adult: Secondary | ICD-10-CM | POA: Diagnosis not present

## 2016-09-11 DIAGNOSIS — Z8249 Family history of ischemic heart disease and other diseases of the circulatory system: Secondary | ICD-10-CM | POA: Diagnosis not present

## 2016-09-11 DIAGNOSIS — E440 Moderate protein-calorie malnutrition: Secondary | ICD-10-CM | POA: Diagnosis not present

## 2016-09-11 DIAGNOSIS — J449 Chronic obstructive pulmonary disease, unspecified: Secondary | ICD-10-CM | POA: Diagnosis not present

## 2016-09-11 DIAGNOSIS — F419 Anxiety disorder, unspecified: Secondary | ICD-10-CM | POA: Insufficient documentation

## 2016-09-11 DIAGNOSIS — C787 Secondary malignant neoplasm of liver and intrahepatic bile duct: Secondary | ICD-10-CM | POA: Diagnosis not present

## 2016-09-11 DIAGNOSIS — Z881 Allergy status to other antibiotic agents status: Secondary | ICD-10-CM | POA: Diagnosis not present

## 2016-09-11 DIAGNOSIS — M1990 Unspecified osteoarthritis, unspecified site: Secondary | ICD-10-CM | POA: Insufficient documentation

## 2016-09-11 DIAGNOSIS — E11621 Type 2 diabetes mellitus with foot ulcer: Secondary | ICD-10-CM | POA: Insufficient documentation

## 2016-09-11 DIAGNOSIS — F0390 Unspecified dementia without behavioral disturbance: Secondary | ICD-10-CM | POA: Insufficient documentation

## 2016-09-11 DIAGNOSIS — C259 Malignant neoplasm of pancreas, unspecified: Secondary | ICD-10-CM | POA: Insufficient documentation

## 2016-09-11 DIAGNOSIS — I480 Paroxysmal atrial fibrillation: Secondary | ICD-10-CM | POA: Diagnosis not present

## 2016-09-11 DIAGNOSIS — L89321 Pressure ulcer of left buttock, stage 1: Secondary | ICD-10-CM | POA: Diagnosis not present

## 2016-09-11 DIAGNOSIS — N189 Chronic kidney disease, unspecified: Secondary | ICD-10-CM | POA: Diagnosis not present

## 2016-09-11 DIAGNOSIS — Z880 Allergy status to penicillin: Secondary | ICD-10-CM | POA: Insufficient documentation

## 2016-09-11 DIAGNOSIS — E1122 Type 2 diabetes mellitus with diabetic chronic kidney disease: Secondary | ICD-10-CM | POA: Diagnosis not present

## 2016-09-11 NOTE — Progress Notes (Addendum)
Madison, [name] ([id number]) Visit Report for 09/11/2016 Chief Complaint Document Details Patient Name: [name], [name]. Date of Service: 09/11/2016 2:00 PM Medical Record Number: [id number] Patient Account Number: [id number] Date of Birth/Sex: [birth year] (81 y.o. Female) Treating RN: Cornell Barman Primary Care Provider: Halina Maidens Other Clinician: Referring Provider: Halina Maidens Treating Provider/Extender: Frann Rider in Treatment: 0 Information Obtained from: Patient Chief Complaint Patient is at the clinic for treatment of an open pressure ulcer to her right calcaneum which has been there for about 4 weeks 09/05/16 Patient is here for review of left medial heel ulcer Electronic Signature(s) Signed: 09/11/2016 3:03:00 PM By: [name], FACS Previous Signature: 09/11/2016 3:00:38 PM Version By: [name], FACS Entered By: [name] on 09/11/2016 15:02:59 [name] ([id number]) -------------------------------------------------------------------------------- Debridement Details Patient Name: [name]. Date of Service: 09/11/2016 2:00 PM Medical Record Number: [id number] Patient Account Number: [id number] Date of Birth/Sex: [birth year] (81 y.o. Female) Treating RN: Cornell Barman Primary Care Provider: Halina Maidens Other Clinician: Referring Provider: Halina Maidens Treating Provider/Extender: Frann Rider in Treatment: 0 Debridement Performed for Wound #10 Left,Medial Calcaneus Assessment: Performed By: Physician [name], MD Debridement: Open Wound/Selective Debridement Selective Description: Pre-procedure Yes - 14:25 Verification/Time Out Taken: Start Time: 14:26 Pain Control: Other : lidocaine 4% Level: Non-Viable Tissue Total Area Debrided (L x 4 (cm) x 3 (cm) = 12 (cm) W): Tissue and other Viable, Non-Viable, Fibrin/Slough, Skin material debrided: Instrument: Forceps, Scissors Bleeding: None Hemostasis  Achieved: Pressure End Time: 14:31 Procedural Pain: 0 Post Procedural Pain: 0 Response to Treatment: Procedure was tolerated well Post Debridement Measurements of Total Wound Length: (cm) 4 Stage: Unstageable/Unclassified Width: (cm) 3 Depth: (cm) 0.1 Volume: (cm) 0.942 Character of Wound/Ulcer Post Requires Further Debridement: Debridement Severity of Tissue Post Limited to breakdown of Debridement: skin Post Procedure Diagnosis Same as Pre-procedure Electronic Signature(s) Signed: 09/11/2016 3:00:30 PM By: [name], FACS [name] ([id number]) Signed: 09/12/2016 1:43:54 PM By: Gretta Cool, RN, BSN, Kim RN, BSN Entered By: [name] on 09/11/2016 15:00:30 [name] ([id number]) -------------------------------------------------------------------------------- HPI Details Patient Name: [name]. Date of Service: 09/11/2016 2:00 PM Medical Record Number: [id number] Patient Account Number: [id number] Date of Birth/Sex: 03-03-1936 (81 y.o. Female) Treating RN: Cornell Barman Primary Care Provider: Halina Maidens Other Clinician: Referring Provider: Halina Maidens Treating Provider/Extender: Frann Rider in Treatment: 0 History of Present Illness Location: right posterior heel, and both lower extremities Quality: Patient reports experiencing a sharp pain to affected area(s). Severity: Patient states wound (s) are getting better. Duration: Patient has had the wound for < 4 weeks prior to presenting for treatment Timing: Pain in wound is Intermittent (comes and goes Context: The wound appeared gradually over time while she was in hospital with a right hip fracture Modifying Factors: Consults to this date include:was seen by her PCP and put on some antibiotics a while ago Associated Signs and Symptoms: Patient reports having difficulty standing for long periods. HPI Description: 81 year old patient referred who was recently discharged in April of this  year with a left heel ulcer now comes with a right heel ulcer and some ulcerations in both lower extremities which have all come along during her recent hospitalization which she had a right hip fracture. She has a past medical history of COPD, diabetes mellitus type 2 with chronic kidney disease, multi-infarct dementia, anxiety, urinary retention, paroxysmal atrial fibrillation. She has also had moderate malnutrition, multi-infarct dementia, congestive heart failure, left displaced femoral neck fracture,  systemic lupus erythematosus. She is also status total abdominal hysterectomy with bilateral salpingo-oophorectomy, EGDs, hip arthroplasty on the left, cataract surgery. She has been a from a smoker and quit in May 2013 and she smoked for about 40 years. recently admitted to [facility] between January 19 and 07/03/2015 12/09/2015 -- o right foot x-ray -- IMPRESSION:Soft tissue ulceration of the heel. No underlying erosive bony abnormality. Diffuse degenerative change. No acute bony abnormality. 12/30/2015-- admitted to the hospital between July 12 and July 18 of 2017 and was treated for cerebral infarction, infected decubitus ulcer and aspiration pneumonia of the right lung. MRI was negative for stroke and was thought to be secondary to pneumonia. She was treated with clindamycin. Patient was discharged home on Keflex and Zyvox. Dr. Lucky Cowboy took her for an aortogram and a right lower extremity runoff and found to 30% stenosis in the mid to distal SFA but no significant stenosis in the popliteal artery and the SFA. There was a two-vessel runoff through the anterior tibial artery and peroneal artery without significant stenosis. felt her perfusion was adequate for wound healing. 02/28/2016 -- days ago she had a lacerated wound on her left forearm which they have been treating locally and have asked me to take a look today. 03/13/16: returns today for f/u. the right posterior  heel wound has achieved epithelialization. the left forearm wound is healing without issue. no systemic s/s of infection. 03/20/16: returns today for f/u of a left forearm wound. husband reports dressing slides down. no reports of systemic s/s of infection. no new wounds or skin breakdown. ALVARETTA, EISENBERGER ([id number]) 04/03/2016 -- she has had another laceration with a skin flap on the left upper arm and she also has one on the anterior part of the left forearm. 05/08/2016 -- this past Friday the dressing was soupy and moist so the husband changed it and he found that there was a lot of surrounding drainage and hence has been changing the dressing and asked to the seen today READMISSION: 09/05/16; This is a frail 81 yo woman who was previously in this clinic with predominantly wounds on her bilateral heels. Patient has dementia as well as type 2 dm on oral agents. Unfortunately she was diagnosed with metastatic pancreatic cancer to the liver presenting in January/18 with RUQ pain and jaundice. She has a CBD stent in place. Her family reports she eats poorly and is no longer ambulatory. She under the care of hospice of [place]. Her husband tells me that he noted a blister on the left heel 24 hrs ago. He opened this. There was a consider able amount of drainage which wa clear. They called urgently to be seen this AM Electronic Signature(s) Signed: 09/11/2016 3:03:09 PM By: [name], FACS Entered By: [name] on 09/11/2016 15:03:09 [name] ([id number]) -------------------------------------------------------------------------------- Physical Exam Details Patient Name: [name] Date of Service: 09/11/2016 2:00 PM Medical Record Number: [id number] Patient Account Number: [id number] Date of Birth/Sex: 02-24-1936 (80 y.o. Female) Treating RN: Cornell Barman Primary Care Provider: Halina Maidens Other Clinician: Referring Provider: Halina Maidens Treating  Provider/Extender: Frann Rider in Treatment: 0 Constitutional . Pulse regular. Respirations normal and unlabored. Afebrile. . Eyes Nonicteric. Reactive to light. Ears, Nose, Mouth, and Throat Lips, teeth, and gums WNL.Marland Kitchen Moist mucosa without lesions. Neck supple and nontender. No palpable supraclavicular or cervical adenopathy. Normal sized without goiter. Respiratory WNL. No retractions.. Cardiovascular Pedal Pulses WNL. No clubbing, cyanosis or edema. Lymphatic No adneopathy. No  adenopathy. No adenopathy. Musculoskeletal Adexa without tenderness or enlargement.. Digits and nails w/o clubbing, cyanosis, infection, petechiae, ischemia, or inflammatory conditions.. Integumentary (Hair, Skin) No suspicious lesions. No crepitus or fluctuance. No peri-wound warmth or erythema. No masses.Marland Kitchen Psychiatric Judgement and insight Intact.. No evidence of depression, anxiety, or agitation.. Notes the left medial calcaneus has a large necrotic area which I sharply removed and this is fairly superficial with the deeper part being unstageable. The right lower extremity and foot only has stage I pressure injuries. Electronic Signature(s) Signed: 09/11/2016 3:04:15 PM By: [name], FACS Entered By: [name] on 09/11/2016 15:04:14 [name] ([id number]) -------------------------------------------------------------------------------- Physician Orders Details Patient Name: [name] Date of Service: 09/11/2016 2:00 PM Medical Record Number: [id number] Patient Account Number: [id number] Date of Birth/Sex: 1935-10-02 (80 y.o. Female) Treating RN: Cornell Barman Primary Care Provider: Halina Maidens Other Clinician: Referring Provider: Halina Maidens Treating Provider/Extender: Frann Rider in Treatment: 0 Verbal / Phone Orders: No Diagnosis Coding ICD-10 Coding Code Description [id number] Pressure ulcer of left heel, unstageable L89.611 Pressure ulcer of right  heel, stage 1 L89.311 Pressure ulcer of right buttock, stage 1 L89.321 Pressure ulcer of left buttock, stage 1 E44.0 Moderate protein-calorie malnutrition Wound Cleansing Wound #10 Left,Medial Calcaneus o Cleanse wound with mild soap and water Anesthetic Wound #10 Left,Medial Calcaneus o Topical Lidocaine 4% cream applied to wound bed prior to debridement - in clinic Primary Wound Dressing Wound #10 Left,Medial Calcaneus o Aquacel Ag Secondary Dressing Wound #10 Left,Medial Calcaneus o Other - HEEL CUP Dressing Change Frequency Wound #10 Left,Medial Calcaneus o Change dressing every other day. Follow-up Appointments Wound #10 Left,Medial Calcaneus o Return Appointment in 1 week. Edema Control Wound #10 Left,Medial Calcaneus TARAHJI, RAMTHUN ([id number]) o Elevate legs to the level of the heart and pump ankles as often as possible Off-Loading Wound #10 Left,Medial Calcaneus o Heel suspension boot to: - Sage/bunny boots on at all times/as tolerated o Mattress - HOSPICE TO PROVIDE PRESSURE RELIEVE SURFACE o Turn and reposition every 2 hours Additional Orders / Instructions Wound #10 Left,Medial Calcaneus o Increase protein intake. o Activity as tolerated o Other: - Vitamin A, C and Zinc Home Health Wound #10 Celina Visits - [place] Nurse may visit PRN to address patientos wound care needs. o FACE TO FACE ENCOUNTER: MEDICARE and MEDICAID PATIENTS: I certify that this patient is under my care and that I had a face-to-face encounter that meets the physician face-to-face encounter requirements with this patient on this date. The encounter with the patient was in whole or in part for the following MEDICAL CONDITION: (primary reason for [place]) MEDICAL NECESSITY: I certify, that based on my findings, NURSING services are a medically necessary home health service. HOME BOUND  STATUS: I certify that my clinical findings support that this patient is homebound (i.e., Due to illness or injury, pt requires aid of supportive devices such as crutches, cane, wheelchairs, walkers, the use of special transportation or the assistance of another person to leave their place of residence. There is a normal inability to leave the home and doing so requires considerable and taxing effort. Other absences are for medical reasons / religious services and are infrequent or of short duration when for other reasons). o If current dressing causes regression in wound condition, may D/C ordered dressing product/s and apply Normal Saline Moist Dressing daily until next [place] / Other MD appointment. [place]  of regression in wound condition at [phone]. o Please direct any NON-WOUND related issues/requests for orders to patient's Primary Care Physician Electronic Signature(s) Signed: 09/11/2016 4:17:13 PM By: [name], FACS Signed: 09/12/2016 1:43:54 PM By: Gretta Cool RN, BSN, Kim RN, BSN Entered By: Gretta Cool, RN, BSN, Kim on 09/11/2016 14:33:45 [name] ([id number]) -------------------------------------------------------------------------------- Problem List Details Patient Name: IRIDIANA, FONNER. Date of Service: 09/11/2016 2:00 PM Medical Record Number: [id number] Patient Account Number: [id number] Date of Birth/Sex: May 22, 1936 (80 y.o. Female) Treating RN: Cornell Barman Primary Care Provider: Halina Maidens Other Clinician: Referring Provider: Halina Maidens Treating Provider/Extender: Frann Rider in Treatment: 0 Active Problems ICD-10 Encounter Code Description Active Date Diagnosis L89.620 Pressure ulcer of left heel, unstageable 09/05/2016 Yes L89.611 Pressure ulcer of right heel, stage 1 09/05/2016 Yes L89.311 Pressure ulcer of right buttock, stage 1 09/05/2016 Yes L89.321 Pressure ulcer of left buttock, stage 1 09/05/2016  Yes E44.0 Moderate protein-calorie malnutrition 09/11/2016 Yes E11.621 Type 2 diabetes mellitus with foot ulcer 09/11/2016 Yes Z51.5 Encounter for palliative care 09/11/2016 Yes Inactive Problems Resolved Problems Electronic Signature(s) Signed: 09/11/2016 3:02:32 PM By: [name], FACS Previous Signature: 09/11/2016 2:59:26 PM Version By: [name], FACS Previous Signature: 09/11/2016 2:18:35 PM Version By: [name], FACS Entered By: [name] on 09/11/2016 15:02:32 [name] ([id number]) -------------------------------------------------------------------------------- Progress Note Details Patient Name: [name] Date of Service: 09/11/2016 2:00 PM Medical Record Number: [id number] Patient Account Number: [id number] Date of Birth/Sex: 1935/09/08 (80 y.o. Female) Treating RN: Cornell Barman Primary Care Provider: Halina Maidens Other Clinician: Referring Provider: Halina Maidens Treating Provider/Extender: Frann Rider in Treatment: 0 Subjective Chief Complaint Information obtained from Patient Patient is at the clinic for treatment of an open pressure ulcer to her right calcaneum which has been there for about 4 weeks 09/05/16 Patient is here for review of left medial heel ulcer History of Present Illness (HPI) The following HPI elements were documented for the patient's wound: Location: right posterior heel, and both lower extremities Quality: Patient reports experiencing a sharp pain to affected area(s). Severity: Patient states wound (s) are getting better. Duration: Patient has had the wound for < 4 weeks prior to presenting for treatment Timing: Pain in wound is Intermittent (comes and goes Context: The wound appeared gradually over time while she was in hospital with a right hip fracture Modifying Factors: Consults to this date include:was seen by her PCP and put on some antibiotics a while ago Associated Signs and Symptoms: Patient reports  having difficulty standing for long periods. 82 year old patient referred who was recently discharged in April of this year with a left heel ulcer now comes with a right heel ulcer and some ulcerations in both lower extremities which have all come along during her recent hospitalization which she had a right hip fracture. She has a past medical history of COPD, diabetes mellitus type 2 with chronic kidney disease, multi-infarct dementia, anxiety, urinary retention, paroxysmal atrial fibrillation. She has also had moderate malnutrition, multi-infarct dementia, congestive heart failure, left displaced femoral neck fracture, systemic lupus erythematosus. She is also status total abdominal hysterectomy with bilateral salpingo-oophorectomy, EGDs, hip arthroplasty on the left, cataract surgery. She has been a from a smoker and quit in May 2013 and she smoked for about 40 years. recently admitted to [facility] between January 19 and 07/03/2015 12/09/2015 -- right foot x-ray -- IMPRESSION:Soft tissue ulceration of the heel. No underlying erosive bony abnormality. Diffuse degenerative change. No acute bony  abnormality. 12/30/2015-- admitted to the hospital between July 12 and July 18 of 2017 and was treated for cerebral infarction, infected decubitus ulcer and aspiration pneumonia of the right lung. MRI was negative for stroke and was thought to be secondary to pneumonia. She was treated with clindamycin. Patient was discharged home on Keflex and Zyvox. Dr. Lucky Cowboy took her for an aortogram and a right lower extremity runoff and found to 30% stenosis in the mid to distal SFA but no significant stenosis in the popliteal artery and the SFA. There was a two-vessel runoff LANORE, RENDEROS. ([id number]) through the anterior tibial artery and peroneal artery without significant stenosis. felt her perfusion was adequate for wound healing. 02/28/2016 -- days ago she had a lacerated wound on her  left forearm which they have been treating locally and have asked me to take a look today. 03/13/16: returns today for f/u. the right posterior heel wound has achieved epithelialization. the left forearm wound is healing without issue. no systemic s/s of infection. 03/20/16: returns today for f/u of a left forearm wound. husband reports dressing slides down. no reports of systemic s/s of infection. no new wounds or skin breakdown. 04/03/2016 -- she has had another laceration with a skin flap on the left upper arm and she also has one on the anterior part of the left forearm. 05/08/2016 -- this past Friday the dressing was soupy and moist so the husband changed it and he found that there was a lot of surrounding drainage and hence has been changing the dressing and asked to the seen today READMISSION: 09/05/16; This is a frail 81 yo woman who was previously in this clinic with predominantly wounds on her bilateral heels. Patient has dementia as well as type 2 dm on oral agents. Unfortunately she was diagnosed with metastatic pancreatic cancer to the liver presenting in January/18 with RUQ pain and jaundice. She has a CBD stent in place. Her family reports she eats poorly and is no longer ambulatory. She under the care of [facility] of [place]. Her husband tells me that he noted a blister on the left heel 24 hrs ago. He opened this. There was a consider able amount of drainage which wa clear. They called urgently to be seen this AM Objective Constitutional Pulse regular. Respirations normal and unlabored. Afebrile. Vitals Time Taken: 2:10 PM, Height: 64 in, Weight: 100 lbs, BMI: 17.2, Temperature: 98.3 F, Pulse: 83 bpm, Respiratory Rate: 16 breaths/min, Blood Pressure: 133/67 mmHg. Eyes Nonicteric. Reactive to light. Ears, Nose, Mouth, and Throat Lips, teeth, and gums WNL.Marland Kitchen Moist mucosa without lesions. Neck supple and nontender. No palpable supraclavicular or cervical adenopathy. Normal  sized without goiter. [name] ([id number]) Respiratory WNL. No retractions.. Cardiovascular Pedal Pulses WNL. No clubbing, cyanosis or edema. Lymphatic No adneopathy. No adenopathy. No adenopathy. Musculoskeletal Adexa without tenderness or enlargement.. Digits and nails w/o clubbing, cyanosis, infection, petechiae, ischemia, or inflammatory conditions.Marland Kitchen Psychiatric Judgement and insight Intact.. No evidence of depression, anxiety, or agitation.. General Notes: the left medial calcaneus has a large necrotic area which I sharply removed and this is fairly superficial with the deeper part being unstageable. The right lower extremity and foot only has stage I pressure injuries. Integumentary (Hair, Skin) No suspicious lesions. No crepitus or fluctuance. No peri-wound warmth or erythema. No masses.. Wound #10 status is Open. Original cause of wound was Blister. The wound is located on the Left,Medial Calcaneus. The wound measures 4cm length x 3cm width x 0.2cm depth; 9.425cm^2 area and  1.885cm^3 volume. The wound is limited to skin breakdown. There is a small amount of serosanguineous drainage noted. The wound margin is distinct with the outline attached to the wound base. There is no granulation within the wound bed. There is a large (67-100%) amount of necrotic tissue within the wound bed including Eschar and Adherent Slough. The periwound skin appearance did not exhibit: Callus, Crepitus, Excoriation, Induration, Rash, Scarring, Dry/Scaly, Maceration, Atrophie Blanche, Cyanosis, Ecchymosis, Hemosiderin Staining, Mottled, Pallor, Rubor, Erythema. Periwound temperature was noted as No Abnormality. The periwound has tenderness on palpation. Other Condition(s) Patient presents with Suspected Deep Tissue Injury located on the Right Foot. The skin appearance exhibited: Dry/Scaly, Ecchymosis. Patient presents with Suspected Deep Tissue Injury located on the Right medial ankle. The  skin appearance exhibited: Ecchymosis. Assessment Active Problems ICD-10 L89.620 - Pressure ulcer of left heel, LOUANA, FONTENOT. ([id number]) L89.611 - Pressure ulcer of right heel, stage 1 L89.311 - Pressure ulcer of right buttock, stage 1 L89.321 - Pressure ulcer of left buttock, stage 1 E44.0 - Moderate protein-calorie malnutrition E11.621 - Type 2 diabetes mellitus with foot ulcer Z51.5 - Encounter for palliative care Procedures Wound #10 Wound #10 is a Pressure Ulcer located on the Left,Medial Calcaneus . There was a Non-Viable Tissue Open Wound/Selective [phone]) debridement with total area of 12 sq cm performed by [name], MD. with the following instrument(s): Forceps and Scissors to remove Viable and Non-Viable tissue/material including Fibrin/Slough and Skin after achieving pain control using Other (lidocaine 4%). A time out was conducted at 14:25, prior to the start of the procedure. There was no bleeding. The procedure was tolerated well with a pain level of 0 throughout and a pain level of 0 following the procedure. Post Debridement Measurements: 4cm length x 3cm width x 0.1cm depth; 0.942cm^3 volume. Post debridement Stage noted as Unstageable/Unclassified. Character of Wound/Ulcer Post Debridement requires further debridement. Severity of Tissue Post Debridement is: Limited to breakdown of skin. Post procedure Diagnosis Wound #10: Same as Pre-Procedure Plan Wound Cleansing: Wound #10 Left,Medial Calcaneus: Cleanse wound with mild soap and water Anesthetic: Wound #10 Left,Medial Calcaneus: Topical Lidocaine 4% cream applied to wound bed prior to debridement - in clinic Primary Wound Dressing: Wound #10 Left,Medial Calcaneus: Aquacel Ag Secondary Dressing: Wound #10 Left,Medial Calcaneus: Other - HEEL CUP Dressing Change Frequency: Wound #10 Left,Medial Calcaneus: Change dressing every other day. Follow-up Appointments: FEATHER, BERRIE  ([id number]) Wound #10 Left,Medial Calcaneus: Return Appointment in 1 week. Edema Control: Wound #10 Left,Medial Calcaneus: Elevate legs to the level of the heart and pump ankles as often as possible Off-Loading: Wound #10 Left,Medial Calcaneus: Heel suspension boot to: - Sage/bunny boots on at all times/as tolerated Mattress - HOSPICE TO PROVIDE PRESSURE RELIEVE SURFACE Turn and reposition every 2 hours Additional Orders / Instructions: Wound #10 Left,Medial Calcaneus: Increase protein intake. Activity as tolerated Other: - Vitamin A, C and Zinc Home Health: Wound #10 Left,Medial Calcaneus: Continue Home Health Visits - [place] Nurse may visit PRN to address patient s wound care needs. FACE TO FACE ENCOUNTER: MEDICARE and MEDICAID PATIENTS: I certify that this patient is under my care and that I had a face-to-face encounter that meets the physician face-to-face encounter requirements with this patient on this date. The encounter with the patient was in whole or in part for the following MEDICAL CONDITION: (primary reason for [place]) MEDICAL NECESSITY: I certify, that based on my findings, NURSING services are a medically necessary home health service. HOME  BOUND STATUS: I certify that my clinical findings support that this patient is homebound (i.e., Due to illness or injury, pt requires aid of supportive devices such as crutches, cane, wheelchairs, walkers, the use of special transportation or the assistance of another person to leave their place of residence. There is a normal inability to leave the home and doing so requires considerable and taxing effort. Other absences are for medical reasons / religious services and are infrequent or of short duration when for other reasons). If current dressing causes regression in wound condition, may D/C ordered dressing product/s and apply Normal Saline Moist Dressing daily until next [place]  / Other MD appointment. [place] of regression in wound condition at [phone]. Please direct any NON-WOUND related issues/requests for orders to patient's Primary Care Physician the sharply debriding the left heel wound we have discussed using silver alginate and a heel cup. We have had a long discussion regarding: 1. Offloading as much as possible all through the day and as much as possible during the night 2. Adequate protein, vitamin A, vitamin C and zinc 3. Palliative care to be given both by hospice at home and also to see as as often as they can come 4. Patient's daughter, caregiver and husband have had all questions answered. Electronic Signature(s) Signed: 09/11/2016 3:05:32 PM By: [name], FACS [name] ([id number]) Entered By: [name] on 09/11/2016 15:05:32 [name] ([id number]) -------------------------------------------------------------------------------- [place] Details Patient Name: [name] Date of Service: 09/11/2016 Medical Record Number: [id number] Patient Account Number: [id number] Date of Birth/Sex: [birth year] (81 y.o. Female) Treating RN: Cornell Barman Primary Care Provider: Halina Maidens Other Clinician: Referring Provider: Halina Maidens Treating Provider/Extender: Frann Rider in Treatment: 0 Diagnosis Coding ICD-10 Codes Code Description [phone] Pressure ulcer of left heel, unstageable L89.611 Pressure ulcer of right heel, stage 1 L89.311 Pressure ulcer of right buttock, stage 1 L89.321 Pressure ulcer of left buttock, stage 1 E44.0 Moderate protein-calorie malnutrition E11.621 Type 2 diabetes mellitus with foot ulcer Z51.5 Encounter for palliative care Facility Procedures CPT4 Code: 14431540 Description: [phone] - DEBRIDE WOUND 1ST 20 SQ CM OR < ICD-10 Description Diagnosis L89.620 Pressure ulcer of left heel, unstageable E11.621 Type 2 diabetes mellitus with foot ulcer Z51.5 Encounter for  palliative care Modifier: Quantity: 1 Physician Procedures CPT4 Code: 1950932 Description: 67124 - WC PHYS DEBR WO ANESTH 20 SQ CM ICD-10 Description Diagnosis L89.620 Pressure ulcer of left heel, unstageable E11.621 Type 2 diabetes mellitus with foot ulcer Z51.5 Encounter for palliative care Modifier: Quantity: 1 Electronic Signature(s) Signed: 09/11/2016 3:05:48 PM By: [name], FACS Entered By: [name] on 09/11/2016 15:05:48

## 2016-09-12 NOTE — Progress Notes (Addendum)
[name], [name] ([id number]) Visit Report for 09/11/2016 Arrival Information Details Patient Name: [name], [name]. Date of Service: 09/11/2016 2:00 PM Medical Record Number: [id number] Patient Account Number: [id number] Date of Birth/Sex: [birth year] (80 y.o. Female) Treating RN: Cornell Barman Primary Care Keiton Cosma: Halina Maidens Other Clinician: Referring Tiasha Helvie: Halina Maidens Treating Marlaine Arey/Extender: Frann Rider in Treatment: 0 Visit Information History Since Last Visit Added or deleted any medications: No Patient Arrived: Wheel Chair Any new allergies or adverse reactions: No Arrival Time: 14:09 Had a fall or experienced change in No Accompanied By: daughter and activities of daily living that may affect caregiver risk of falls: Transfer Assistance: Manual Signs or symptoms of abuse/neglect since last No Patient Identification Verified: Yes visito Secondary Verification Process Yes Has Dressing in Place as Prescribed: Yes Completed: Pain Present Now: No Patient Requires Transmission- No Based Precautions: Patient Has Alerts: No Electronic Signature(s) Signed: 09/12/2016 1:43:54 PM By: Gretta Cool, RN, BSN, Kim RN, BSN Entered By: Gretta Cool, RN, BSN, Kim on 09/11/2016 14:10:36 [name] ([id number]) -------------------------------------------------------------------------------- Encounter Discharge Information Details Patient Name: [name]. Date of Service: 09/11/2016 2:00 PM Medical Record Number: [id number] Patient Account Number: [id number] Date of Birth/Sex: 1935/12/14 (80 y.o. Female) Treating RN: Cornell Barman Primary Care Afnan Emberton: Halina Maidens Other Clinician: Referring Marquetta Weiskopf: Halina Maidens Treating Lashica Hannay/Extender: Frann Rider in Treatment: 0 Encounter Discharge Information Items Discharge Pain Level: 0 Discharge Condition: Stable Ambulatory Status: Wheelchair Discharge Destination: Home Transportation: Private  Auto Accompanied By: self Schedule Follow-up Appointment: Yes Medication Reconciliation completed Yes and provided to Patient/Care [name] [place]: Provided on Clinical Summary of Care: 09/11/2016 Form Type Recipient Paper Patient LK Electronic Signature(s) Signed: 09/12/2016 1:43:54 PM By: Gretta Cool RN, BSN, Kim RN, BSN Previous Signature: 09/11/2016 2:43:37 PM Version By: Ruthine Dose Entered By: Gretta Cool RN, BSN, Kim on 09/11/2016 16:28:18 [name] ([id number]) -------------------------------------------------------------------------------- Lower Extremity Assessment Details Patient Name: [name]. Date of Service: 09/11/2016 2:00 PM Medical Record Number: [id number] Patient Account Number: [id number] Date of Birth/Sex: [birth year] (80 y.o. Female) Treating RN: Cornell Barman Primary Care Randa Riss: Halina Maidens Other Clinician: Referring Yoshika Vensel: Halina Maidens Treating Tiarra Anastacio/Extender: Frann Rider in Treatment: 0 Edema Assessment Assessed: Shirlyn Goltz: No] [Right: No] Edema: [Left: N] [Right: o] Vascular Assessment Pulses: Dorsalis Pedis Palpable: [Right:Yes] Posterior Tibial Palpable: [Right:Yes] Extremity colors, hair growth, and conditions: Extremity Color: [Right:Normal] Hair Growth on Extremity: [Right:No] Temperature of Extremity: [Right:Cool] Capillary Refill: [Right:> 3 seconds] Dependent Rubor: [Right:No] Blanched when Elevated: [Right:No] Lipodermatosclerosis: [Right:No] Toe Nail Assessment Left: Right: Thick: No Discolored: No Deformed: No Improper Length and Hygiene: No Electronic Signature(s) Signed: 09/12/2016 1:43:54 PM By: Gretta Cool, RN, BSN, Kim RN, BSN Entered By: Gretta Cool, RN, BSN, Kim on 09/11/2016 14:19:02 [name] ([id number]) -------------------------------------------------------------------------------- Multi Wound Chart Details Patient Name: [name] Date of Service: 09/11/2016 2:00 PM Medical Record Number:  [id number] Patient Account Number: [id number] Date of Birth/Sex: 07/19/1935 (80 y.o. Female) Treating RN: Cornell Barman Primary Care Asiyah Pineau: Halina Maidens Other Clinician: Referring Mea Ozga: Halina Maidens Treating Jayvon Mounger/Extender: Frann Rider in Treatment: 0 Vital Signs Height(in): 64 Pulse(bpm): 83 Weight(lbs): 100 Blood Pressure 133/67 (mmHg): Body Mass Index(BMI): 17 Temperature(F): 98.3 Respiratory Rate 16 (breaths/min): Photos: [N/A:N/A] Wound Location: Left Calcaneus - Medial N/A N/A Wounding Event: Blister N/A N/A Primary Etiology: Pressure Ulcer N/A N/A Comorbid History: Chronic Obstructive N/A N/A Pulmonary Disease (COPD), Angina, Congestive Heart Failure, Type II Diabetes, Lupus Erythematosus, Osteoarthritis, Dementia Date Acquired: 09/04/2016 N/A N/A Weeks of Treatment: 0 N/A N/A Wound Status: Open N/A  N/A Measurements L x W x D 4x3x0.2 N/A N/A (cm) Area (cm) : 9.425 N/A N/A Volume (cm) : 1.885 N/A N/A % Reduction in Area: 33.30% N/A N/A % Reduction in Volume: -33.30% N/A N/A Classification: Unstageable/Unclassified N/A N/A HBO Classification: Grade 1 N/A N/A Exudate Amount: Small N/A N/A Exudate Type: Serosanguineous N/A N/A Exudate Color: red, brown N/A N/A [name], [name] ([id number]) Wound Margin: Distinct, outline attached N/A N/A Granulation Amount: None Present (0%) N/A N/A Necrotic Amount: Large (67-100%) N/A N/A Necrotic Tissue: Eschar, Adherent Slough N/A N/A Exposed Structures: Fascia: No N/A N/A Fat Layer (Subcutaneous Tissue) Exposed: No Tendon: No Muscle: No Joint: No Bone: No Limited to Skin Breakdown Epithelialization: None N/A N/A Debridement: Open Wound/Selective N/A N/A ([id number]) - Selective Pre-procedure 14:25 N/A N/A Verification/Time Out Taken: Pain Control: Other N/A N/A Tissue Debrided: Fibrin/Slough, Skin N/A N/A Level: Non-Viable Tissue N/A N/A Debridement Area (sq 12 N/A  N/A cm): Instrument: Forceps, Scissors N/A N/A Bleeding: None N/A N/A Hemostasis Achieved: Pressure N/A N/A Procedural Pain: 0 N/A N/A Post Procedural Pain: 0 N/A N/A Debridement Treatment Procedure was tolerated N/A N/A Response: well Post Debridement 4x3x0.1 N/A N/A Measurements L x W x D (cm) Post Debridement 0.942 N/A N/A Volume: (cm) Post Debridement Unstageable/Unclassified N/A N/A Stage: Periwound Skin Texture: Excoriation: No N/A N/A Induration: No Callus: No Crepitus: No Rash: No Scarring: No Periwound Skin Maceration: No N/A N/A Moisture: Dry/Scaly: No Periwound Skin Color: Atrophie Blanche: No N/A N/A Cyanosis: No Ecchymosis: No Erythema: No [name], [name] ([id number]) Hemosiderin Staining: No Mottled: No Pallor: No Rubor: No Temperature: No Abnormality N/A N/A Tenderness on Yes N/A N/A Palpation: Wound Preparation: Ulcer Cleansing: N/A N/A Rinsed/Irrigated with Saline Topical Anesthetic Applied: Other: lidocaine 4% Procedures Performed: Debridement N/A N/A Treatment Notes Electronic Signature(s) Signed: 09/11/2016 3:02:39 PM By: [name], FACS Previous Signature: 09/11/2016 2:59:32 PM Version By: [name], FACS Entered By: [name] on 09/11/2016 15:02:39 [name] ([id number]) -------------------------------------------------------------------------------- [place] Details Patient Name: [name], [name]. Date of Service: 09/11/2016 2:00 PM Medical Record Number: [id number] Patient Account Number: [id number] Date of Birth/Sex: [birth year] (80 y.o. Female) Treating RN: Cornell Barman Primary Care Riot Barrick: Halina Maidens Other Clinician: Referring Timathy Newberry: Halina Maidens Treating Deunta Beneke/Extender: Frann Rider in Treatment: 0 Active Inactive Electronic Signature(s) Signed: 09/29/2016 12:04:17 PM By: Gretta Cool RN, BSN, Kim RN, BSN Previous Signature: 09/12/2016 1:43:54 PM Version By: Gretta Cool RN,  BSN, Kim RN, BSN Entered By: Gretta Cool, RN, BSN, Kim on 09/29/2016 12:04:16 [name] ([id number]) -------------------------------------------------------------------------------- Non-Wound Condition Assessment Details Patient Name: [name], [name]. Date of Service: 09/11/2016 2:00 PM Medical Record Number: [id number] Patient Account Number: [id number] Date of Birth/Sex: [birth year] (80 y.o. Female) Treating RN: Cornell Barman Primary Care Rebekah Sprinkle: Halina Maidens Other Clinician: Referring Edgar Corrigan: Halina Maidens Treating Major Santerre/Extender: Frann Rider in Treatment: 0 Non-Wound Condition: Condition: Suspected Deep Tissue Injury Location: Foot Side: Right Photos Periwound Skin Texture Texture Color No Abnormalities Noted: No No Abnormalities Noted: No Ecchymosis: Yes Moisture No Abnormalities Noted: No Dry / Scaly: Yes Electronic Signature(s) Signed: 09/12/2016 1:43:54 PM By: Gretta Cool, RN, BSN, Kim RN, BSN Entered By: Gretta Cool, RN, BSN, Kim on 09/11/2016 14:20:15 [name] ([id number]) -------------------------------------------------------------------------------- Non-Wound Condition Assessment Details Patient Name: [name] Date of Service: 09/11/2016 2:00 PM Medical Record Number: [id number] Patient Account Number: [id number] Date of Birth/Sex: 1936/02/26 (80 y.o. Female) Treating RN: Cornell Barman Primary Care Kenidi Elenbaas: Halina Maidens Other Clinician: Referring Jordon Bourquin: Halina Maidens Treating Bernette Seeman/Extender: Con Memos,  Errol Weeks in Treatment: 0 Non-Wound Condition: Condition: Suspected Deep Tissue Injury Location: Other: medial ankle Side: Right Photos Periwound Skin Texture Texture Color No Abnormalities Noted: No No Abnormalities Noted: No Ecchymosis: Yes Moisture No Abnormalities Noted: No Electronic Signature(s) Signed: 09/12/2016 1:43:54 PM By: Gretta Cool, RN, BSN, Kim RN, BSN Entered By: Gretta Cool, RN, BSN, Kim on 09/11/2016 14:20:38 [name] ([id number]) -------------------------------------------------------------------------------- Pain Assessment Details Patient Name: [name]. Date of Service: 09/11/2016 2:00 PM Medical Record Number: [id number] Patient Account Number: [id number] Date of Birth/Sex: [birth year] (80 y.o. Female) Treating RN: Cornell Barman Primary Care Gevin Perea: Halina Maidens Other Clinician: Referring Brunella Wileman: Halina Maidens Treating Layden Caterino/Extender: Frann Rider in Treatment: 0 Active Problems Location of Pain Severity and Description of Pain Patient Has Paino No Site Locations With Dressing Change: No Pain Management and Medication Current Pain Management: Electronic Signature(s) Signed: 09/12/2016 1:43:54 PM By: Gretta Cool, RN, BSN, Kim RN, BSN Entered By: Gretta Cool, RN, BSN, Kim on 09/11/2016 14:10:44 [name] ([id number]) -------------------------------------------------------------------------------- Patient/Caregiver Education Details Patient Name: [name] Date of Service: 09/11/2016 2:00 PM Medical Record Number: [id number] Patient Account Number: [id number] Date of Birth/Gender: 1936/02/22 (81 y.o. Female) Treating RN: Cornell Barman Primary Care Physician: Halina Maidens Other Clinician: Referring Physician: Halina Maidens Treating Physician/Extender: Frann Rider in Treatment: 0 Education Assessment Education Provided To: Patient Education Topics Provided Pressure: Handouts: Pressure Ulcers: Care and Offloading Methods: Demonstration Responses: State content correctly Welcome To [facility] [place]: Electronic Signature(s) Signed: 09/12/2016 1:43:54 PM By: Gretta Cool, RN, BSN, Kim RN, BSN Entered By: Gretta Cool, RN, BSN, Kim on 09/11/2016 16:28:34 [name] ([id number]) -------------------------------------------------------------------------------- Wound Assessment Details Patient Name: [name] Date of Service: 09/11/2016 2:00  PM Medical Record Number: [id number] Patient Account Number: [id number] Date of Birth/Sex: 09/28/1935 (80 y.o. Female) Treating RN: Cornell Barman Primary Care Elowen Debruyn: Halina Maidens Other Clinician: Referring Merrill Deanda: Halina Maidens Treating Annamaria Salah/Extender: Frann Rider in Treatment: 0 Wound Status Wound Number: 10 Primary Pressure Ulcer Etiology: Wound Location: Left Calcaneus - Medial Wound Open Wounding Event: Blister Status: Date Acquired: 09/04/2016 Comorbid Chronic Obstructive Pulmonary Disease Weeks Of Treatment: 0 History: (COPD), Angina, Congestive Heart Clustered Wound: No Failure, Type II Diabetes, Lupus Erythematosus, Osteoarthritis, Dementia Photos Wound Measurements Length: (cm) 4 Width: (cm) 3 Depth: (cm) 0.2 Area: (cm) 9.425 Volume: (cm) 1.885 % Reduction in Area: 33.3% % Reduction in Volume: -33.3% Epithelialization: None Wound Description Classification: Unstageable/Unclassified Foul Diabetic Severity (Wagner): Grade 1 Sloug Wound Margin: Distinct, outline attached Exudate Amount: Small Exudate Type: Serosanguineous Exudate Color: red, brown Odor After Cleansing: No h/Fibrino Yes Wound Bed Granulation Amount: None Present (0%) Exposed Structure Necrotic Amount: Large (67-100%) Fascia Exposed: No Necrotic Quality: Eschar, Adherent Slough Fat Layer (Subcutaneous Tissue) Exposed: No Tendon Exposed: No Muscle Exposed: No [name], [name] ([id number]) Joint Exposed: No Bone Exposed: No Limited to Skin Breakdown Periwound Skin Texture Texture Color No Abnormalities Noted: No No Abnormalities Noted: No Callus: No Atrophie Blanche: No Crepitus: No Cyanosis: No Excoriation: No Ecchymosis: No Induration: No Erythema: No Rash: No Hemosiderin Staining: No Scarring: No Mottled: No Pallor: No Moisture Rubor: No No Abnormalities Noted: No Dry / Scaly: No Temperature / Pain Maceration: No Temperature: No  Abnormality Tenderness on Palpation: Yes Wound Preparation Ulcer Cleansing: Rinsed/Irrigated with Saline Topical Anesthetic Applied: Other: lidocaine 4%, Electronic Signature(s) Signed: 09/12/2016 1:43:54 PM By: Gretta Cool, RN, BSN, Kim RN, BSN Entered By: Gretta Cool, RN, BSN, Kim on 09/11/2016 14:18:13 [name] ([id number]) -------------------------------------------------------------------------------- [place] Details Patient Name:  [name] Date of Service: 09/11/2016 2:00 PM Medical Record Number: [id number] Patient Account Number: [id number] Date of Birth/Sex: 04/04/1936 (80 y.o. Female) Treating RN: Cornell Barman Primary Care Mahlani Berninger: Halina Maidens Other Clinician: Referring Keone Kamer: Halina Maidens Treating Shakeema Lippman/Extender: Frann Rider in Treatment: 0 Vital Signs Time Taken: 14:10 Temperature (F): 98.3 Height (in): 64 Pulse (bpm): 83 Weight (lbs): 100 Respiratory Rate (breaths/min): 16 Body Mass Index (BMI): 17.2 Blood Pressure (mmHg): 133/67 Reference Range: 80 - 120 mg / dl Electronic Signature(s) Signed: 09/12/2016 1:43:54 PM By: Gretta Cool, RN, BSN, Kim RN, BSN Entered By: Gretta Cool, RN, BSN, Kim on 09/11/2016 14:11:06

## 2016-09-13 DIAGNOSIS — J9611 Chronic respiratory failure with hypoxia: Secondary | ICD-10-CM | POA: Diagnosis not present

## 2016-09-13 DIAGNOSIS — I5020 Unspecified systolic (congestive) heart failure: Secondary | ICD-10-CM | POA: Diagnosis not present

## 2016-09-13 DIAGNOSIS — G308 Other Alzheimer's disease: Secondary | ICD-10-CM | POA: Diagnosis not present

## 2016-09-13 DIAGNOSIS — C259 Malignant neoplasm of pancreas, unspecified: Secondary | ICD-10-CM | POA: Diagnosis not present

## 2016-09-13 DIAGNOSIS — G893 Neoplasm related pain (acute) (chronic): Secondary | ICD-10-CM | POA: Diagnosis not present

## 2016-09-13 DIAGNOSIS — C787 Secondary malignant neoplasm of liver and intrahepatic bile duct: Secondary | ICD-10-CM | POA: Diagnosis not present

## 2016-09-16 DIAGNOSIS — G308 Other Alzheimer's disease: Secondary | ICD-10-CM | POA: Diagnosis not present

## 2016-09-16 DIAGNOSIS — C259 Malignant neoplasm of pancreas, unspecified: Secondary | ICD-10-CM | POA: Diagnosis not present

## 2016-09-16 DIAGNOSIS — J9611 Chronic respiratory failure with hypoxia: Secondary | ICD-10-CM | POA: Diagnosis not present

## 2016-09-16 DIAGNOSIS — I5020 Unspecified systolic (congestive) heart failure: Secondary | ICD-10-CM | POA: Diagnosis not present

## 2016-09-16 DIAGNOSIS — G893 Neoplasm related pain (acute) (chronic): Secondary | ICD-10-CM | POA: Diagnosis not present

## 2016-09-16 DIAGNOSIS — C787 Secondary malignant neoplasm of liver and intrahepatic bile duct: Secondary | ICD-10-CM | POA: Diagnosis not present

## 2016-09-17 DIAGNOSIS — C787 Secondary malignant neoplasm of liver and intrahepatic bile duct: Secondary | ICD-10-CM | POA: Diagnosis not present

## 2016-09-17 DIAGNOSIS — C259 Malignant neoplasm of pancreas, unspecified: Secondary | ICD-10-CM | POA: Diagnosis not present

## 2016-09-17 DIAGNOSIS — I5020 Unspecified systolic (congestive) heart failure: Secondary | ICD-10-CM | POA: Diagnosis not present

## 2016-09-17 DIAGNOSIS — G308 Other Alzheimer's disease: Secondary | ICD-10-CM | POA: Diagnosis not present

## 2016-09-17 DIAGNOSIS — G893 Neoplasm related pain (acute) (chronic): Secondary | ICD-10-CM | POA: Diagnosis not present

## 2016-09-17 DIAGNOSIS — J9611 Chronic respiratory failure with hypoxia: Secondary | ICD-10-CM | POA: Diagnosis not present

## 2016-09-18 DIAGNOSIS — G308 Other Alzheimer's disease: Secondary | ICD-10-CM | POA: Diagnosis not present

## 2016-09-18 DIAGNOSIS — C259 Malignant neoplasm of pancreas, unspecified: Secondary | ICD-10-CM | POA: Diagnosis not present

## 2016-09-18 DIAGNOSIS — J9611 Chronic respiratory failure with hypoxia: Secondary | ICD-10-CM | POA: Diagnosis not present

## 2016-09-18 DIAGNOSIS — I5020 Unspecified systolic (congestive) heart failure: Secondary | ICD-10-CM | POA: Diagnosis not present

## 2016-09-18 DIAGNOSIS — G893 Neoplasm related pain (acute) (chronic): Secondary | ICD-10-CM | POA: Diagnosis not present

## 2016-09-18 DIAGNOSIS — C787 Secondary malignant neoplasm of liver and intrahepatic bile duct: Secondary | ICD-10-CM | POA: Diagnosis not present

## 2016-09-19 ENCOUNTER — Ambulatory Visit: Admit: 2016-09-19 | Payer: Medicare Other | Admitting: Gastroenterology

## 2016-09-19 SURGERY — ERCP, WITH INTERVENTION IF INDICATED
Anesthesia: General

## 2016-09-20 DIAGNOSIS — G893 Neoplasm related pain (acute) (chronic): Secondary | ICD-10-CM | POA: Diagnosis not present

## 2016-09-20 DIAGNOSIS — J9611 Chronic respiratory failure with hypoxia: Secondary | ICD-10-CM | POA: Diagnosis not present

## 2016-09-20 DIAGNOSIS — G308 Other Alzheimer's disease: Secondary | ICD-10-CM | POA: Diagnosis not present

## 2016-09-20 DIAGNOSIS — C787 Secondary malignant neoplasm of liver and intrahepatic bile duct: Secondary | ICD-10-CM | POA: Diagnosis not present

## 2016-09-20 DIAGNOSIS — C259 Malignant neoplasm of pancreas, unspecified: Secondary | ICD-10-CM | POA: Diagnosis not present

## 2016-09-20 DIAGNOSIS — I5020 Unspecified systolic (congestive) heart failure: Secondary | ICD-10-CM | POA: Diagnosis not present

## 2016-09-21 ENCOUNTER — Ambulatory Visit: Payer: Medicare Other | Admitting: Surgery

## 2016-09-23 DIAGNOSIS — C787 Secondary malignant neoplasm of liver and intrahepatic bile duct: Secondary | ICD-10-CM | POA: Diagnosis not present

## 2016-09-23 DIAGNOSIS — G893 Neoplasm related pain (acute) (chronic): Secondary | ICD-10-CM | POA: Diagnosis not present

## 2016-09-23 DIAGNOSIS — I5020 Unspecified systolic (congestive) heart failure: Secondary | ICD-10-CM | POA: Diagnosis not present

## 2016-09-23 DIAGNOSIS — C259 Malignant neoplasm of pancreas, unspecified: Secondary | ICD-10-CM | POA: Diagnosis not present

## 2016-09-23 DIAGNOSIS — J9611 Chronic respiratory failure with hypoxia: Secondary | ICD-10-CM | POA: Diagnosis not present

## 2016-09-23 DIAGNOSIS — G308 Other Alzheimer's disease: Secondary | ICD-10-CM | POA: Diagnosis not present

## 2016-09-25 ENCOUNTER — Ambulatory Visit: Payer: Medicare Other | Admitting: Surgery

## 2016-09-25 DIAGNOSIS — G308 Other Alzheimer's disease: Secondary | ICD-10-CM | POA: Diagnosis not present

## 2016-09-25 DIAGNOSIS — G893 Neoplasm related pain (acute) (chronic): Secondary | ICD-10-CM | POA: Diagnosis not present

## 2016-09-25 DIAGNOSIS — C259 Malignant neoplasm of pancreas, unspecified: Secondary | ICD-10-CM | POA: Diagnosis not present

## 2016-09-25 DIAGNOSIS — C787 Secondary malignant neoplasm of liver and intrahepatic bile duct: Secondary | ICD-10-CM | POA: Diagnosis not present

## 2016-09-25 DIAGNOSIS — J9611 Chronic respiratory failure with hypoxia: Secondary | ICD-10-CM | POA: Diagnosis not present

## 2016-09-25 DIAGNOSIS — I5020 Unspecified systolic (congestive) heart failure: Secondary | ICD-10-CM | POA: Diagnosis not present

## 2016-09-27 ENCOUNTER — Other Ambulatory Visit: Payer: Self-pay | Admitting: Internal Medicine

## 2016-09-27 DIAGNOSIS — C259 Malignant neoplasm of pancreas, unspecified: Secondary | ICD-10-CM | POA: Diagnosis not present

## 2016-09-27 DIAGNOSIS — I5020 Unspecified systolic (congestive) heart failure: Secondary | ICD-10-CM | POA: Diagnosis not present

## 2016-09-27 DIAGNOSIS — G308 Other Alzheimer's disease: Secondary | ICD-10-CM | POA: Diagnosis not present

## 2016-09-27 DIAGNOSIS — C787 Secondary malignant neoplasm of liver and intrahepatic bile duct: Secondary | ICD-10-CM | POA: Diagnosis not present

## 2016-09-27 DIAGNOSIS — N181 Chronic kidney disease, stage 1: Principal | ICD-10-CM

## 2016-09-27 DIAGNOSIS — J9611 Chronic respiratory failure with hypoxia: Secondary | ICD-10-CM | POA: Diagnosis not present

## 2016-09-27 DIAGNOSIS — E1122 Type 2 diabetes mellitus with diabetic chronic kidney disease: Secondary | ICD-10-CM

## 2016-09-27 DIAGNOSIS — G893 Neoplasm related pain (acute) (chronic): Secondary | ICD-10-CM | POA: Diagnosis not present

## 2016-09-29 DIAGNOSIS — G893 Neoplasm related pain (acute) (chronic): Secondary | ICD-10-CM | POA: Diagnosis not present

## 2016-09-29 DIAGNOSIS — C259 Malignant neoplasm of pancreas, unspecified: Secondary | ICD-10-CM | POA: Diagnosis not present

## 2016-09-29 DIAGNOSIS — C787 Secondary malignant neoplasm of liver and intrahepatic bile duct: Secondary | ICD-10-CM | POA: Diagnosis not present

## 2016-09-29 DIAGNOSIS — G308 Other Alzheimer's disease: Secondary | ICD-10-CM | POA: Diagnosis not present

## 2016-09-29 DIAGNOSIS — I5020 Unspecified systolic (congestive) heart failure: Secondary | ICD-10-CM | POA: Diagnosis not present

## 2016-09-29 DIAGNOSIS — J9611 Chronic respiratory failure with hypoxia: Secondary | ICD-10-CM | POA: Diagnosis not present

## 2016-09-30 DIAGNOSIS — C259 Malignant neoplasm of pancreas, unspecified: Secondary | ICD-10-CM | POA: Diagnosis not present

## 2016-09-30 DIAGNOSIS — I5020 Unspecified systolic (congestive) heart failure: Secondary | ICD-10-CM | POA: Diagnosis not present

## 2016-09-30 DIAGNOSIS — G308 Other Alzheimer's disease: Secondary | ICD-10-CM | POA: Diagnosis not present

## 2016-09-30 DIAGNOSIS — G893 Neoplasm related pain (acute) (chronic): Secondary | ICD-10-CM | POA: Diagnosis not present

## 2016-09-30 DIAGNOSIS — J9611 Chronic respiratory failure with hypoxia: Secondary | ICD-10-CM | POA: Diagnosis not present

## 2016-09-30 DIAGNOSIS — C787 Secondary malignant neoplasm of liver and intrahepatic bile duct: Secondary | ICD-10-CM | POA: Diagnosis not present

## 2016-10-01 DIAGNOSIS — C787 Secondary malignant neoplasm of liver and intrahepatic bile duct: Secondary | ICD-10-CM | POA: Diagnosis not present

## 2016-10-01 DIAGNOSIS — C259 Malignant neoplasm of pancreas, unspecified: Secondary | ICD-10-CM | POA: Diagnosis not present

## 2016-10-01 DIAGNOSIS — I5020 Unspecified systolic (congestive) heart failure: Secondary | ICD-10-CM | POA: Diagnosis not present

## 2016-10-01 DIAGNOSIS — J9611 Chronic respiratory failure with hypoxia: Secondary | ICD-10-CM | POA: Diagnosis not present

## 2016-10-01 DIAGNOSIS — G308 Other Alzheimer's disease: Secondary | ICD-10-CM | POA: Diagnosis not present

## 2016-10-01 DIAGNOSIS — G893 Neoplasm related pain (acute) (chronic): Secondary | ICD-10-CM | POA: Diagnosis not present

## 2016-10-02 ENCOUNTER — Telehealth: Payer: Self-pay | Admitting: *Deleted

## 2016-10-02 NOTE — Telephone Encounter (Signed)
Called to notify of death on 4/22/1/ @ 6:03 AM

## 2016-10-10 DEATH — deceased

## 2017-01-24 ENCOUNTER — Ambulatory Visit: Payer: Medicare Other | Admitting: Urology
